# Patient Record
Sex: Male | Born: 1945 | Race: Black or African American | Hispanic: No | Marital: Single | Smoking: Former smoker
Health system: Southern US, Community
[De-identification: ages and names within clinical notes are randomized; demographics above are authoritative.]

## PROBLEM LIST (undated history)

## (undated) DIAGNOSIS — Z86711 Personal history of pulmonary embolism: Secondary | ICD-10-CM

## (undated) DIAGNOSIS — I5032 Chronic diastolic (congestive) heart failure: Secondary | ICD-10-CM

## (undated) DIAGNOSIS — I209 Angina pectoris, unspecified: Secondary | ICD-10-CM

## (undated) DIAGNOSIS — I2510 Atherosclerotic heart disease of native coronary artery without angina pectoris: Secondary | ICD-10-CM

## (undated) DIAGNOSIS — I428 Other cardiomyopathies: Secondary | ICD-10-CM

## (undated) DIAGNOSIS — C3490 Malignant neoplasm of unspecified part of unspecified bronchus or lung: Secondary | ICD-10-CM

## (undated) DIAGNOSIS — Z95810 Presence of automatic (implantable) cardiac defibrillator: Secondary | ICD-10-CM

## (undated) DIAGNOSIS — I509 Heart failure, unspecified: Secondary | ICD-10-CM

## (undated) DIAGNOSIS — N189 Chronic kidney disease, unspecified: Secondary | ICD-10-CM

## (undated) DIAGNOSIS — I10 Essential (primary) hypertension: Secondary | ICD-10-CM

## (undated) DIAGNOSIS — M1990 Unspecified osteoarthritis, unspecified site: Secondary | ICD-10-CM

## (undated) DIAGNOSIS — J189 Pneumonia, unspecified organism: Secondary | ICD-10-CM

## (undated) DIAGNOSIS — J45909 Unspecified asthma, uncomplicated: Secondary | ICD-10-CM

## (undated) DIAGNOSIS — R0600 Dyspnea, unspecified: Secondary | ICD-10-CM

## (undated) DIAGNOSIS — N183 Chronic kidney disease, stage 3 unspecified: Secondary | ICD-10-CM

## (undated) DIAGNOSIS — R519 Headache, unspecified: Secondary | ICD-10-CM

## (undated) DIAGNOSIS — I219 Acute myocardial infarction, unspecified: Secondary | ICD-10-CM

## (undated) DIAGNOSIS — I639 Cerebral infarction, unspecified: Secondary | ICD-10-CM

## (undated) DIAGNOSIS — K219 Gastro-esophageal reflux disease without esophagitis: Secondary | ICD-10-CM

## (undated) DIAGNOSIS — J449 Chronic obstructive pulmonary disease, unspecified: Secondary | ICD-10-CM

## (undated) DIAGNOSIS — I82409 Acute embolism and thrombosis of unspecified deep veins of unspecified lower extremity: Secondary | ICD-10-CM

## (undated) DIAGNOSIS — E119 Type 2 diabetes mellitus without complications: Secondary | ICD-10-CM

## (undated) DIAGNOSIS — C801 Malignant (primary) neoplasm, unspecified: Secondary | ICD-10-CM

## (undated) DIAGNOSIS — E7800 Pure hypercholesterolemia, unspecified: Secondary | ICD-10-CM

## (undated) DIAGNOSIS — M109 Gout, unspecified: Secondary | ICD-10-CM

## (undated) HISTORY — DX: Chronic diastolic (congestive) heart failure: I50.32

## (undated) HISTORY — DX: Chronic kidney disease, stage 3 unspecified: N18.30

## (undated) HISTORY — DX: Pure hypercholesterolemia, unspecified: E78.00

## (undated) HISTORY — DX: Other cardiomyopathies: I42.8

## (undated) HISTORY — DX: Heart failure, unspecified: I50.9

## (undated) HISTORY — DX: Acute embolism and thrombosis of unspecified deep veins of unspecified lower extremity: I82.409

## (undated) HISTORY — DX: Atherosclerotic heart disease of native coronary artery without angina pectoris: I25.10

## (undated) HISTORY — DX: Unspecified osteoarthritis, unspecified site: M19.90

## (undated) HISTORY — PX: CATARACT EXTRACTION, BILATERAL: SHX1313

## (undated) HISTORY — DX: Acute myocardial infarction, unspecified: I21.9

## (undated) HISTORY — PX: CERVICAL SPINE SURGERY: SHX589

## (undated) HISTORY — DX: Gastro-esophageal reflux disease without esophagitis: K21.9

## (undated) HISTORY — DX: Cerebral infarction, unspecified: I63.9

## (undated) HISTORY — PX: NOSE SURGERY: SHX723

## (undated) HISTORY — DX: Personal history of pulmonary embolism: Z86.711

---

## 2009-05-09 DIAGNOSIS — R079 Chest pain, unspecified: Secondary | ICD-10-CM | POA: Diagnosis present

## 2009-05-09 DIAGNOSIS — I10 Essential (primary) hypertension: Secondary | ICD-10-CM | POA: Insufficient documentation

## 2009-05-09 DIAGNOSIS — J45909 Unspecified asthma, uncomplicated: Secondary | ICD-10-CM | POA: Insufficient documentation

## 2012-08-03 DIAGNOSIS — D72829 Elevated white blood cell count, unspecified: Secondary | ICD-10-CM | POA: Insufficient documentation

## 2012-08-03 DIAGNOSIS — N19 Unspecified kidney failure: Secondary | ICD-10-CM | POA: Insufficient documentation

## 2012-08-03 DIAGNOSIS — E875 Hyperkalemia: Secondary | ICD-10-CM | POA: Insufficient documentation

## 2013-07-14 DIAGNOSIS — R634 Abnormal weight loss: Secondary | ICD-10-CM | POA: Insufficient documentation

## 2013-07-14 DIAGNOSIS — R000 Tachycardia, unspecified: Secondary | ICD-10-CM | POA: Insufficient documentation

## 2013-08-23 DIAGNOSIS — E785 Hyperlipidemia, unspecified: Secondary | ICD-10-CM | POA: Diagnosis present

## 2013-08-23 DIAGNOSIS — Z8249 Family history of ischemic heart disease and other diseases of the circulatory system: Secondary | ICD-10-CM | POA: Insufficient documentation

## 2013-08-23 DIAGNOSIS — E119 Type 2 diabetes mellitus without complications: Secondary | ICD-10-CM

## 2013-08-23 DIAGNOSIS — J479 Bronchiectasis, uncomplicated: Secondary | ICD-10-CM | POA: Insufficient documentation

## 2014-02-24 DIAGNOSIS — R590 Localized enlarged lymph nodes: Secondary | ICD-10-CM | POA: Insufficient documentation

## 2014-09-01 DIAGNOSIS — Z86711 Personal history of pulmonary embolism: Secondary | ICD-10-CM

## 2014-09-01 HISTORY — DX: Personal history of pulmonary embolism: Z86.711

## 2014-10-20 DIAGNOSIS — J189 Pneumonia, unspecified organism: Secondary | ICD-10-CM | POA: Insufficient documentation

## 2014-10-20 DIAGNOSIS — R590 Localized enlarged lymph nodes: Secondary | ICD-10-CM | POA: Insufficient documentation

## 2014-12-25 DIAGNOSIS — I447 Left bundle-branch block, unspecified: Secondary | ICD-10-CM | POA: Insufficient documentation

## 2014-12-27 DIAGNOSIS — J209 Acute bronchitis, unspecified: Secondary | ICD-10-CM | POA: Insufficient documentation

## 2014-12-27 DIAGNOSIS — I25119 Atherosclerotic heart disease of native coronary artery with unspecified angina pectoris: Secondary | ICD-10-CM | POA: Insufficient documentation

## 2015-02-22 DIAGNOSIS — Z1211 Encounter for screening for malignant neoplasm of colon: Secondary | ICD-10-CM | POA: Insufficient documentation

## 2015-03-23 DIAGNOSIS — R918 Other nonspecific abnormal finding of lung field: Secondary | ICD-10-CM | POA: Diagnosis present

## 2015-05-10 DIAGNOSIS — J189 Pneumonia, unspecified organism: Secondary | ICD-10-CM | POA: Insufficient documentation

## 2015-06-18 DIAGNOSIS — R0609 Other forms of dyspnea: Secondary | ICD-10-CM | POA: Insufficient documentation

## 2015-06-18 DIAGNOSIS — R0600 Dyspnea, unspecified: Secondary | ICD-10-CM | POA: Insufficient documentation

## 2015-06-18 DIAGNOSIS — R7982 Elevated C-reactive protein (CRP): Secondary | ICD-10-CM | POA: Insufficient documentation

## 2015-06-26 DIAGNOSIS — J151 Pneumonia due to Pseudomonas: Secondary | ICD-10-CM | POA: Insufficient documentation

## 2015-08-15 DIAGNOSIS — J471 Bronchiectasis with (acute) exacerbation: Secondary | ICD-10-CM | POA: Insufficient documentation

## 2015-09-13 DIAGNOSIS — E119 Type 2 diabetes mellitus without complications: Secondary | ICD-10-CM | POA: Insufficient documentation

## 2015-09-18 DIAGNOSIS — J441 Chronic obstructive pulmonary disease with (acute) exacerbation: Secondary | ICD-10-CM | POA: Insufficient documentation

## 2015-09-24 DIAGNOSIS — J470 Bronchiectasis with acute lower respiratory infection: Secondary | ICD-10-CM | POA: Insufficient documentation

## 2017-04-24 DIAGNOSIS — J189 Pneumonia, unspecified organism: Secondary | ICD-10-CM | POA: Insufficient documentation

## 2018-10-11 ENCOUNTER — Encounter (HOSPITAL_COMMUNITY): Payer: Self-pay | Admitting: *Deleted

## 2018-10-11 ENCOUNTER — Inpatient Hospital Stay (HOSPITAL_COMMUNITY)
Admission: EM | Admit: 2018-10-11 | Discharge: 2018-10-14 | DRG: 871 | Disposition: A | Discharge status: Home / Self Care | Payer: Medicare HMO | Attending: Internal Medicine | Admitting: Internal Medicine

## 2018-10-11 ENCOUNTER — Other Ambulatory Visit: Payer: Self-pay

## 2018-10-11 ENCOUNTER — Emergency Department (HOSPITAL_COMMUNITY): Payer: Medicare HMO

## 2018-10-11 DIAGNOSIS — Z79899 Other long term (current) drug therapy: Secondary | ICD-10-CM

## 2018-10-11 DIAGNOSIS — Z7901 Long term (current) use of anticoagulants: Secondary | ICD-10-CM

## 2018-10-11 DIAGNOSIS — J181 Lobar pneumonia, unspecified organism: Secondary | ICD-10-CM | POA: Diagnosis present

## 2018-10-11 DIAGNOSIS — I2510 Atherosclerotic heart disease of native coronary artery without angina pectoris: Secondary | ICD-10-CM | POA: Diagnosis present

## 2018-10-11 DIAGNOSIS — Z86718 Personal history of other venous thrombosis and embolism: Secondary | ICD-10-CM

## 2018-10-11 DIAGNOSIS — Z66 Do not resuscitate: Secondary | ICD-10-CM | POA: Diagnosis present

## 2018-10-11 DIAGNOSIS — J189 Pneumonia, unspecified organism: Secondary | ICD-10-CM | POA: Diagnosis present

## 2018-10-11 DIAGNOSIS — I5042 Chronic combined systolic (congestive) and diastolic (congestive) heart failure: Secondary | ICD-10-CM | POA: Diagnosis present

## 2018-10-11 DIAGNOSIS — A419 Sepsis, unspecified organism: Secondary | ICD-10-CM | POA: Diagnosis not present

## 2018-10-11 DIAGNOSIS — Z87891 Personal history of nicotine dependence: Secondary | ICD-10-CM

## 2018-10-11 DIAGNOSIS — R079 Chest pain, unspecified: Secondary | ICD-10-CM | POA: Diagnosis present

## 2018-10-11 DIAGNOSIS — Z8249 Family history of ischemic heart disease and other diseases of the circulatory system: Secondary | ICD-10-CM

## 2018-10-11 DIAGNOSIS — R6510 Systemic inflammatory response syndrome (SIRS) of non-infectious origin without acute organ dysfunction: Secondary | ICD-10-CM | POA: Diagnosis present

## 2018-10-11 DIAGNOSIS — Z794 Long term (current) use of insulin: Secondary | ICD-10-CM

## 2018-10-11 DIAGNOSIS — J9601 Acute respiratory failure with hypoxia: Secondary | ICD-10-CM | POA: Diagnosis present

## 2018-10-11 DIAGNOSIS — E119 Type 2 diabetes mellitus without complications: Secondary | ICD-10-CM

## 2018-10-11 DIAGNOSIS — Z79891 Long term (current) use of opiate analgesic: Secondary | ICD-10-CM

## 2018-10-11 DIAGNOSIS — Z85118 Personal history of other malignant neoplasm of bronchus and lung: Secondary | ICD-10-CM

## 2018-10-11 DIAGNOSIS — I110 Hypertensive heart disease with heart failure: Secondary | ICD-10-CM | POA: Diagnosis present

## 2018-10-11 DIAGNOSIS — R9431 Abnormal electrocardiogram [ECG] [EKG]: Secondary | ICD-10-CM | POA: Diagnosis present

## 2018-10-11 DIAGNOSIS — I429 Cardiomyopathy, unspecified: Secondary | ICD-10-CM | POA: Diagnosis present

## 2018-10-11 DIAGNOSIS — I447 Left bundle-branch block, unspecified: Secondary | ICD-10-CM | POA: Diagnosis present

## 2018-10-11 DIAGNOSIS — I10 Essential (primary) hypertension: Secondary | ICD-10-CM | POA: Diagnosis present

## 2018-10-11 DIAGNOSIS — M109 Gout, unspecified: Secondary | ICD-10-CM | POA: Diagnosis present

## 2018-10-11 HISTORY — DX: Pneumonia, unspecified organism: J18.9

## 2018-10-11 HISTORY — DX: Unspecified asthma, uncomplicated: J45.909

## 2018-10-11 HISTORY — DX: Atherosclerotic heart disease of native coronary artery without angina pectoris: I25.10

## 2018-10-11 HISTORY — DX: Malignant neoplasm of unspecified part of unspecified bronchus or lung: C34.90

## 2018-10-11 HISTORY — DX: Type 2 diabetes mellitus without complications: E11.9

## 2018-10-11 HISTORY — DX: Essential (primary) hypertension: I10

## 2018-10-11 HISTORY — DX: Malignant (primary) neoplasm, unspecified: C80.1

## 2018-10-11 HISTORY — DX: Gout, unspecified: M10.9

## 2018-10-11 LAB — BASIC METABOLIC PANEL
Anion gap: 10 (ref 5–15)
BUN: 12 mg/dL (ref 8–23)
CO2: 21 mmol/L — AB (ref 22–32)
Calcium: 9.5 mg/dL (ref 8.9–10.3)
Chloride: 107 mmol/L (ref 98–111)
Creatinine, Ser: 1.27 mg/dL — ABNORMAL HIGH (ref 0.61–1.24)
GFR calc non Af Amer: 57 mL/min — ABNORMAL LOW (ref >60)
Glucose, Bld: 108 mg/dL — ABNORMAL HIGH (ref 70–99)
Potassium: 4.1 mmol/L (ref 3.5–5.1)
Sodium: 138 mmol/L (ref 135–145)

## 2018-10-11 LAB — BRAIN NATRIURETIC PEPTIDE: B Natriuretic Peptide: 499 pg/mL — ABNORMAL HIGH (ref 0.0–100.0)

## 2018-10-11 LAB — CBC
HCT: 47.4 % (ref 39.0–52.0)
Hemoglobin: 15 g/dL (ref 13.0–17.0)
MCH: 29.1 pg (ref 26.0–34.0)
MCHC: 31.6 g/dL (ref 30.0–36.0)
MCV: 92 fL (ref 80.0–100.0)
PLATELETS: 213 10*3/uL (ref 150–400)
RBC: 5.15 MIL/uL (ref 4.22–5.81)
RDW: 14.4 % (ref 11.5–15.5)
WBC: 12.3 10*3/uL — ABNORMAL HIGH (ref 4.0–10.5)
nRBC: 0 % (ref 0.0–0.2)

## 2018-10-11 LAB — INFLUENZA PANEL BY PCR (TYPE A & B)
Influenza A By PCR: NEGATIVE
Influenza B By PCR: NEGATIVE

## 2018-10-11 LAB — TROPONIN I: Troponin I: 0.05 ng/mL (ref <0.03)

## 2018-10-11 LAB — LACTIC ACID, PLASMA: Lactic Acid, Venous: 1.4 mmol/L (ref 0.5–1.9)

## 2018-10-11 IMAGING — CR DG CHEST 1V PORT
2 series · 2 of 2 positions shown · non-contrast
Comparison: None.

CLINICAL DATA: Left chest pain, nausea, vomiting and fever.

EXAM:
PORTABLE CHEST 1 VIEW

[ap (1 of 2)]
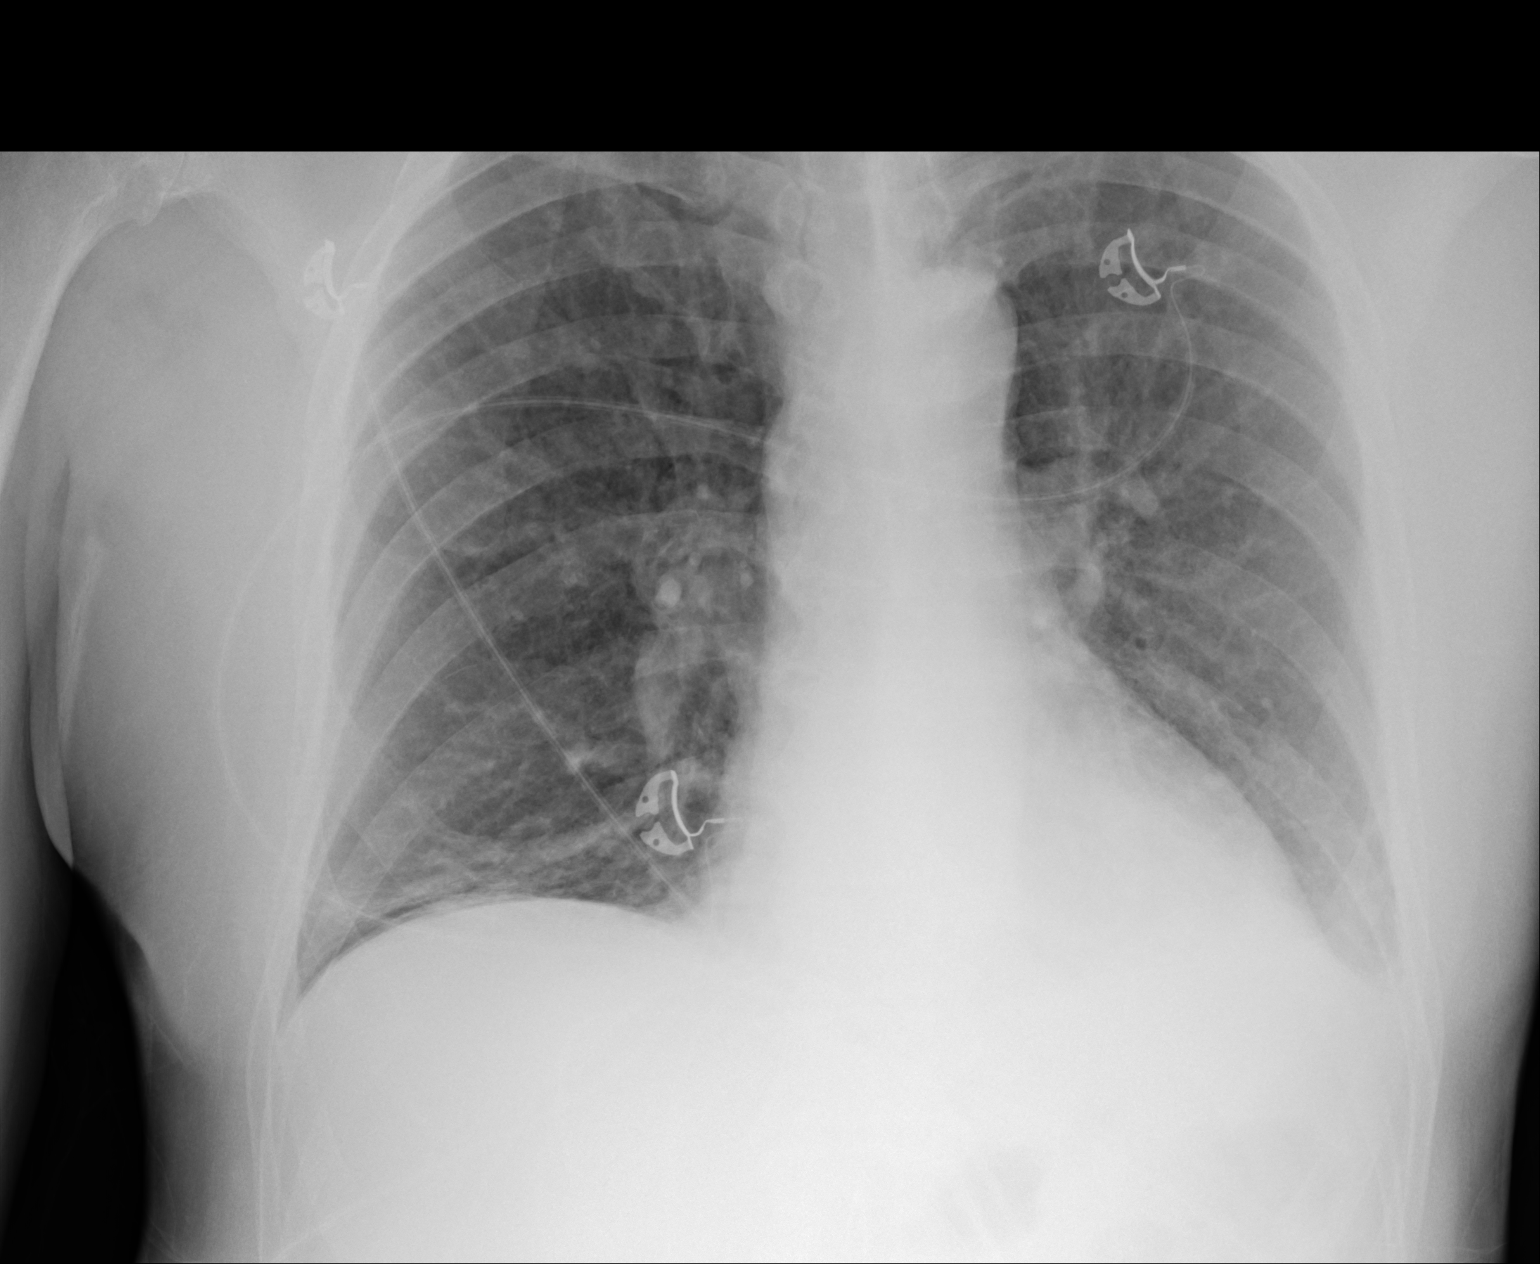

[ap (2 of 2)]
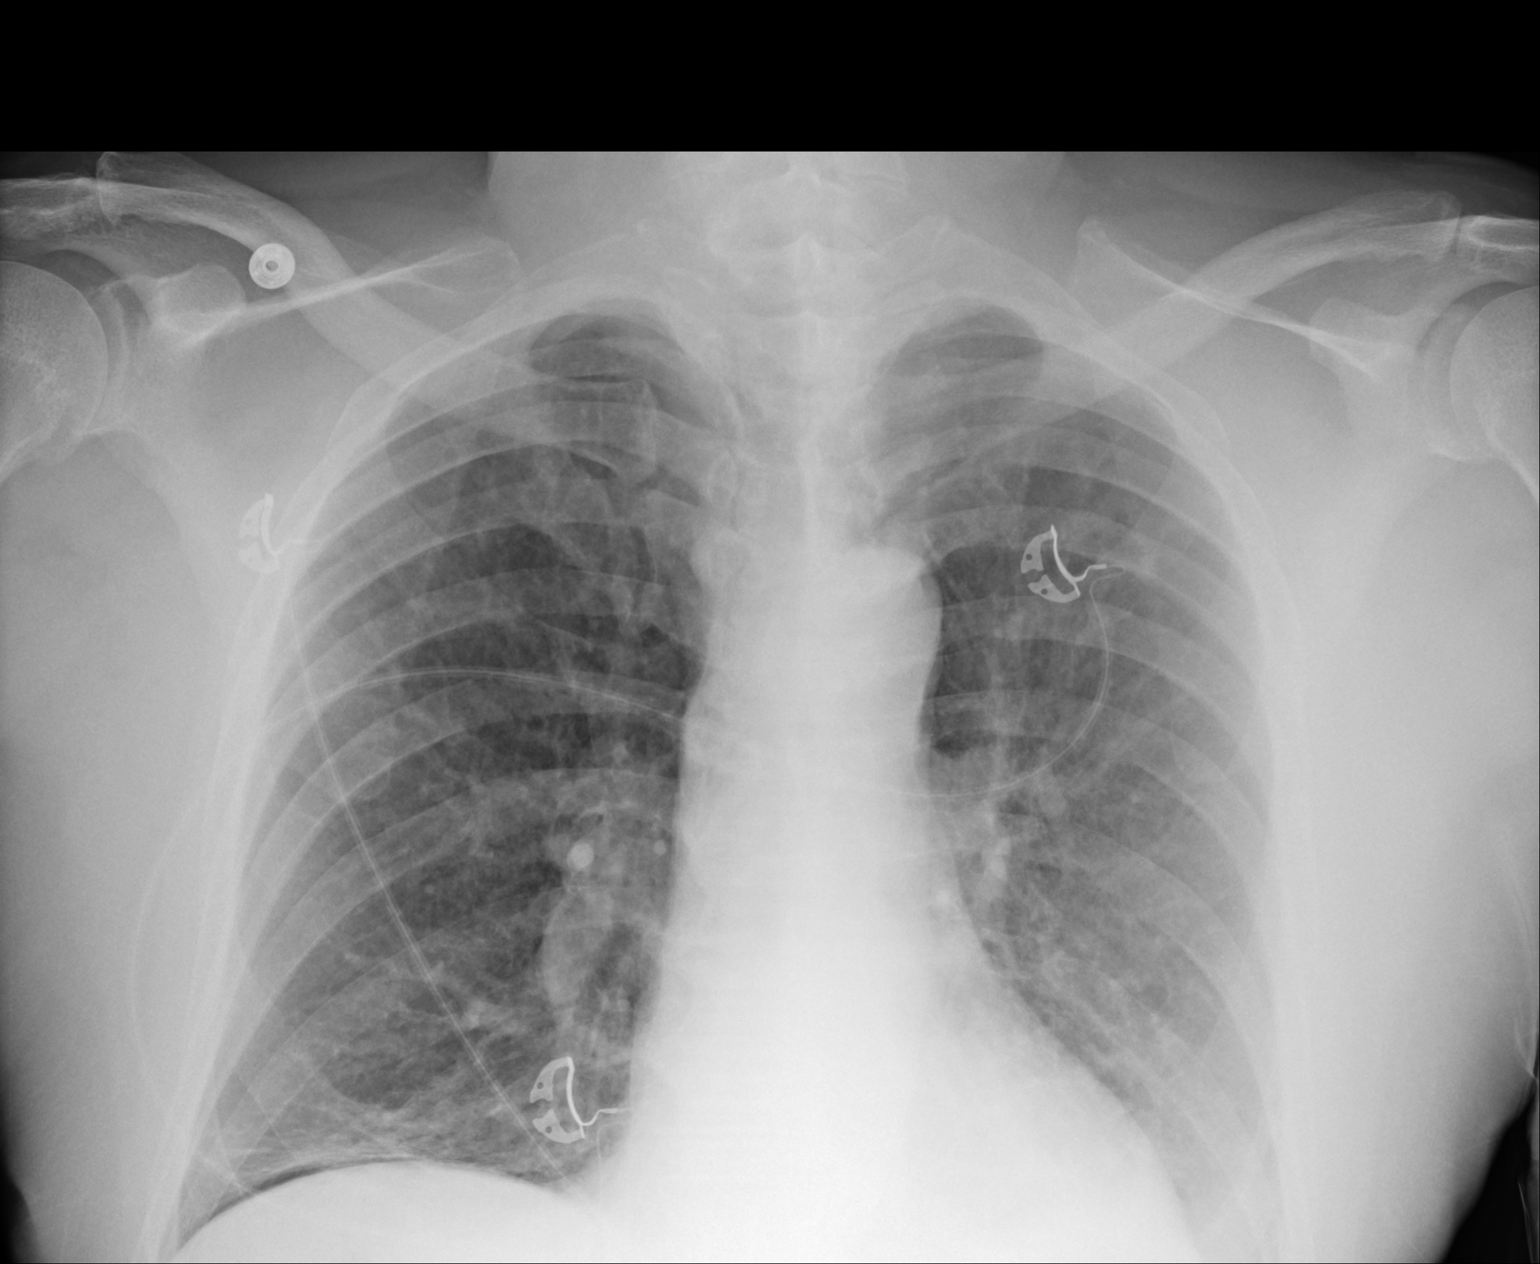

[2 of 2 positions shown; findings below may reference images not displayed]

FINDINGS: There is left basilar airspace disease and a small left effusion.
Lucency is seen subjacent to the right lung base. Heart size is
normal. Aortic atherosclerosis is noted. No acute bony abnormality.
IMPRESSION: Left effusion and basilar airspace disease worrisome for pneumonia.

Thin lucency subjacent to the right lung base. Note is made the
patient is scheduled for a CT of the chest this same day. Recommend
attention to this finding on that examination.

## 2018-10-11 IMAGING — CT CT ANGIO CHEST
2 of 6 series · 18 of 46 positions shown · IV contrast (iopamidol)
Comparison: Chest x-ray [DATE]

CLINICAL DATA: Left chest pain.  Cough.

EXAM:
CT ANGIOGRAPHY CHEST WITH CONTRAST
TECHNIQUE: Multidetector CT imaging of the chest was performed using the
standard protocol during bolus administration of intravenous
contrast. Multiplanar CT image reconstructions and MIPs were
obtained to evaluate the vascular anatomy.
CONTRAST:  100mL [4A] IOPAMIDOL ([4A]) INJECTION 76%

[Series 5: thins · axial · 0.82mm/px · z∈[-191,+46]mm · 15 of 261 slices shown]
[im 12/261  lung]
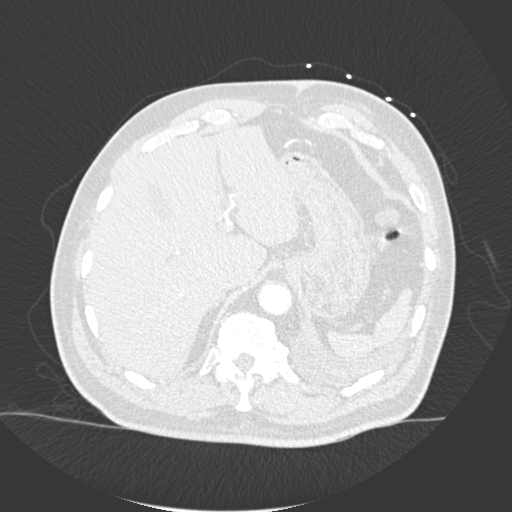
[im 34/261  soft-tissue]
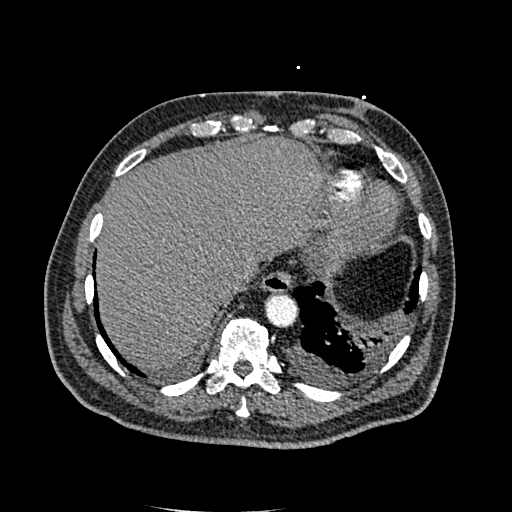
[im 46/261  lung]
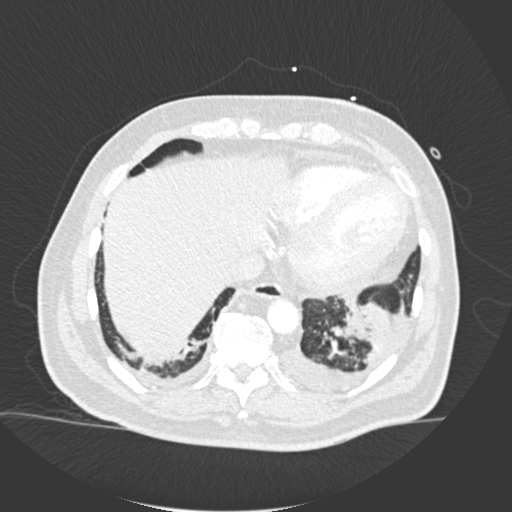
[im 68/261  soft-tissue]
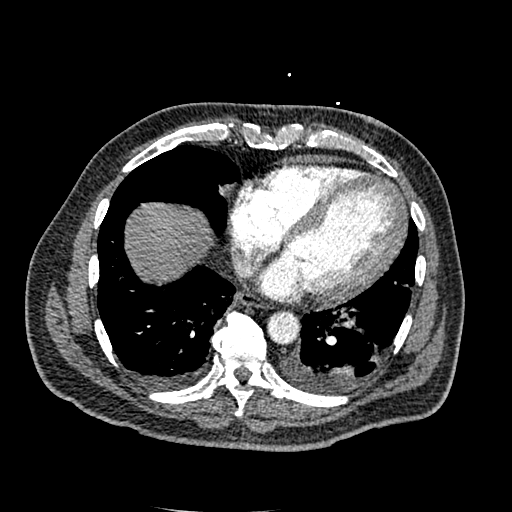
[im 80/261  lung]
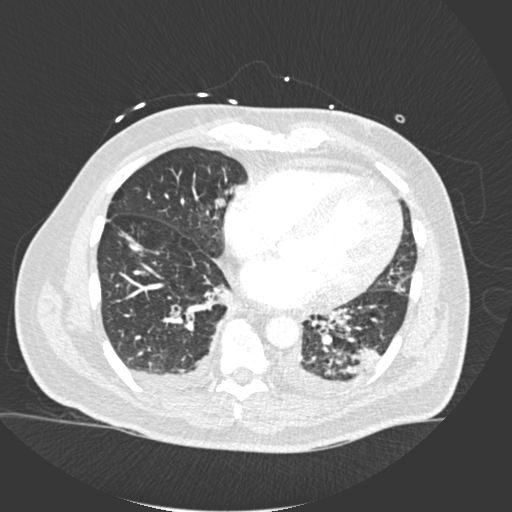
[im 102/261  soft-tissue]
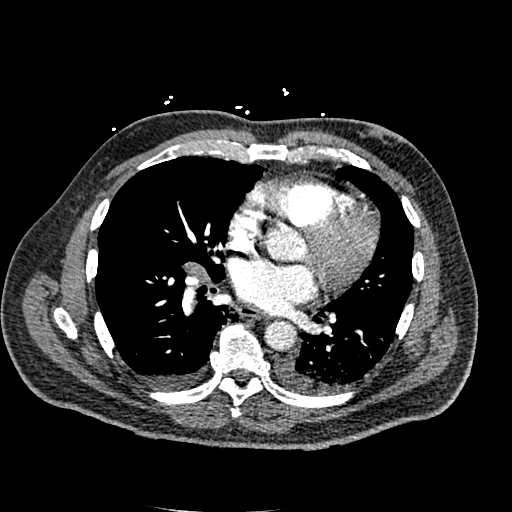
[im 114/261  lung]
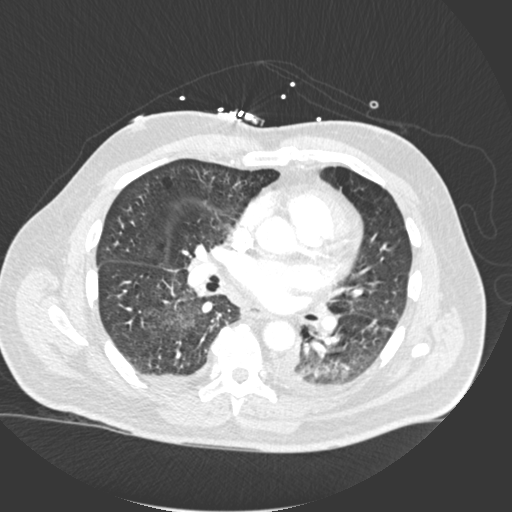
[im 136/261  soft-tissue]
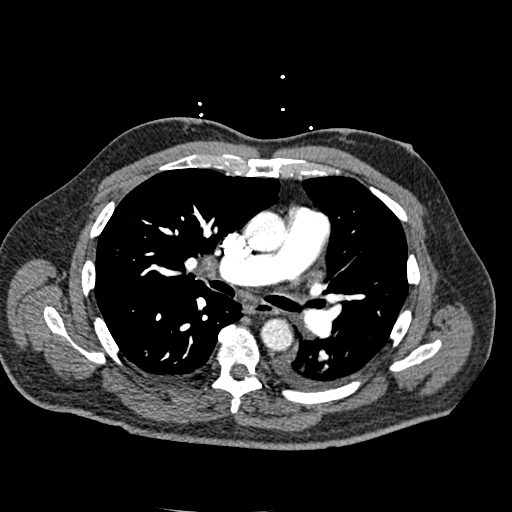
[im 147/261  lung]
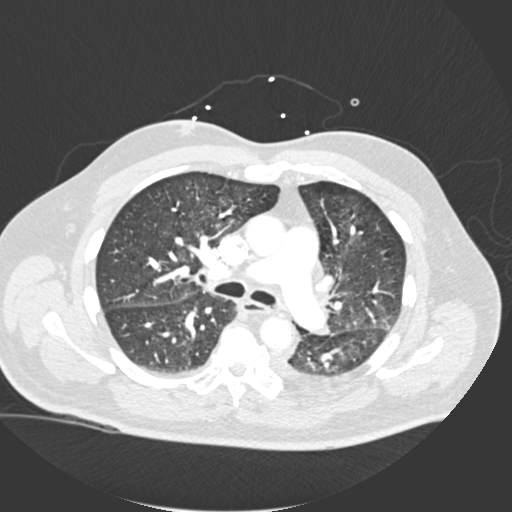
[im 159/261  soft-tissue]
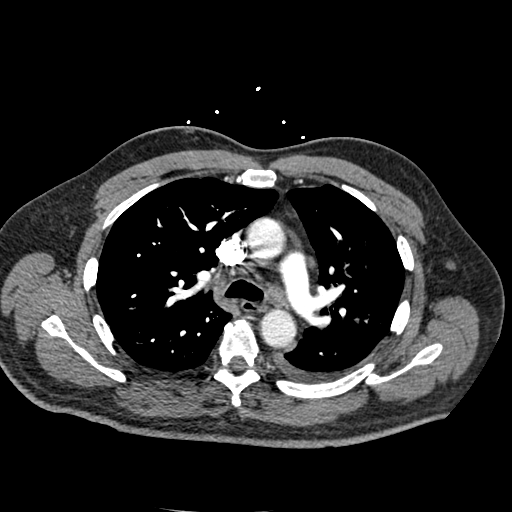
[im 181/261  lung]
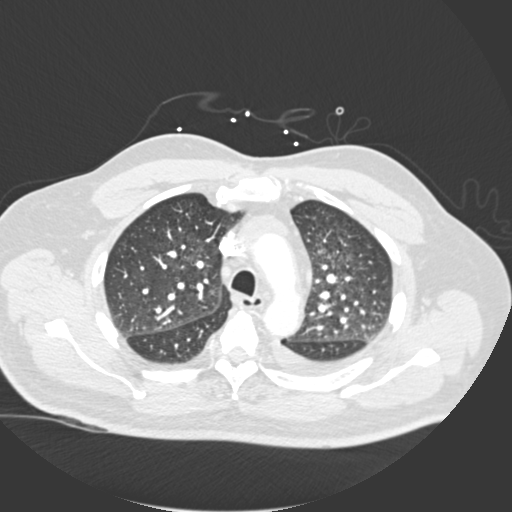
[im 193/261  soft-tissue]
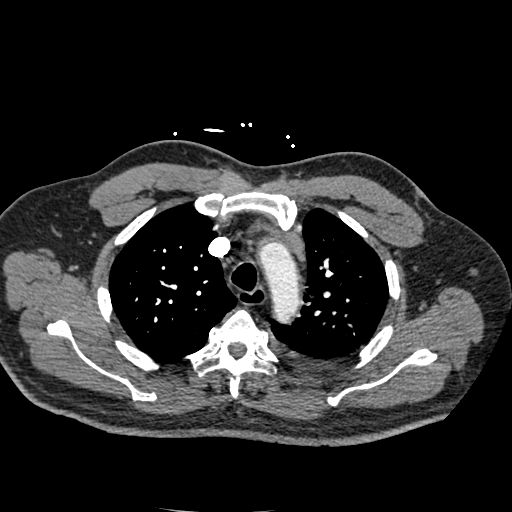
[im 215/261  lung]
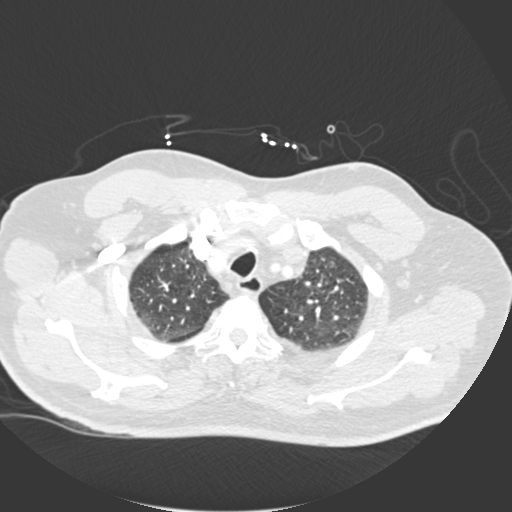
[im 227/261  soft-tissue]
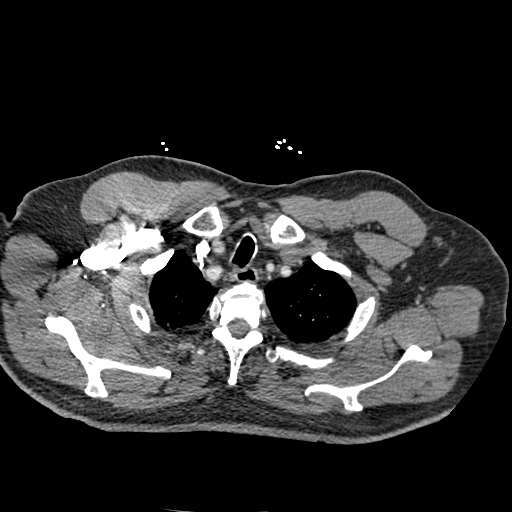
[im 249/261  lung]
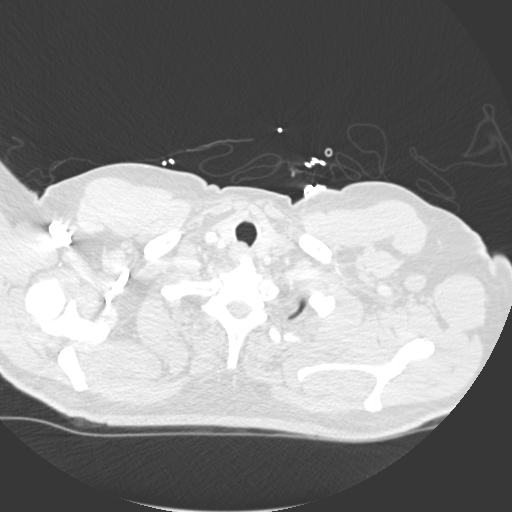

[Series 8: coronal mpr · coronal · 0.52mm/px · 3 of 151 slices shown]
[im 38/151  soft-tissue]
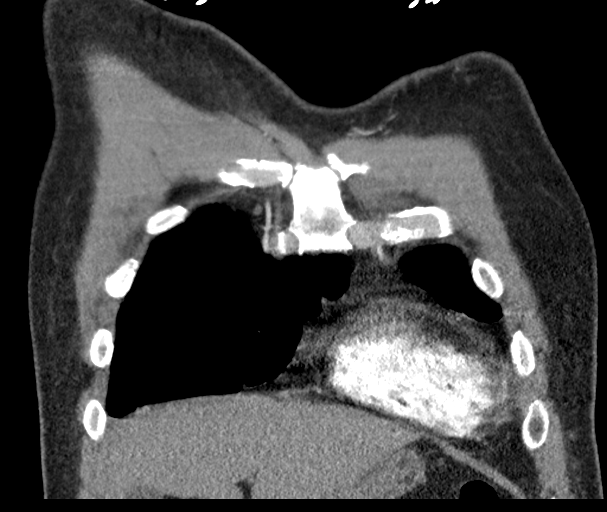
[im 76/151  soft-tissue]
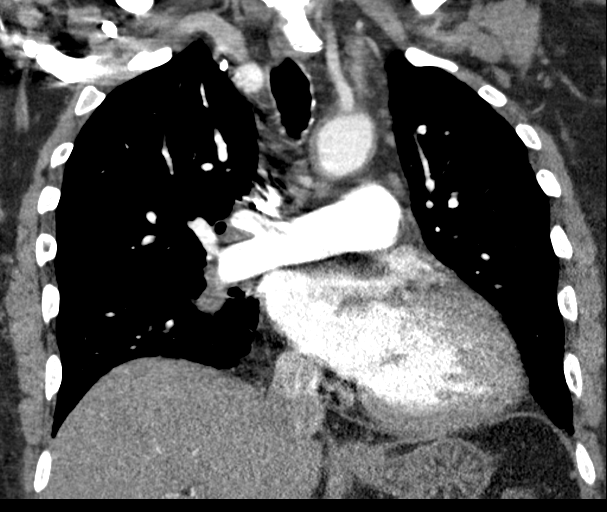
[im 113/151  soft-tissue]
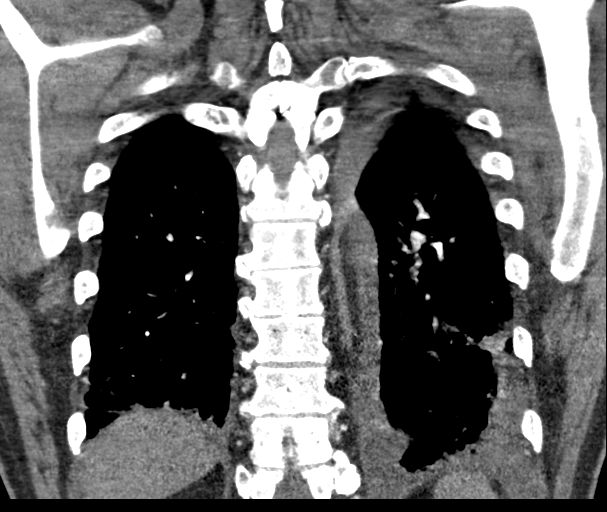

[18 of 46 positions shown; findings below may reference images not displayed]

FINDINGS: Cardiovascular: No filling defects in the pulmonary arteries to
suggest pulmonary emboli. Heart is mildly enlarged. Scattered
coronary artery and aortic calcifications.

Mediastinum/Nodes: Mildly enlarged AP window lymph node measures 12
mm in short axis diameter. Scattered other borderline sized
mediastinal and bilateral hilar lymph nodes. No axillary adenopathy.

Lungs/Pleura: Small bilateral pleural effusions. Peribronchial
thickening in the lower lobes. Right basilar atelectasis. More
confluent opacity noted in the left lower lobe concerning for
pneumonia.

Upper Abdomen: Imaging into the upper abdomen shows no acute
findings.

Musculoskeletal: Chest wall soft tissues are unremarkable. No acute
bony abnormality.

Review of the MIP images confirms the above findings.
IMPRESSION: Small bilateral pleural effusions, bibasilar atelectasis. More
confluent opacity in the left lower lobe concerning for pneumonia.

No evidence of pulmonary embolus.

Mildly enlarged and borderline sized mediastinal and bilateral hilar
lymph nodes, likely reactive.

Mild cardiomegaly.

Aortic Atherosclerosis ([4A]-[4A]).

## 2018-10-11 MED ORDER — SODIUM CHLORIDE 0.9 % IV BOLUS
1000.0000 mL | Freq: Once | INTRAVENOUS | Status: AC
  Administered 2018-10-11: 1000 mL via INTRAVENOUS

## 2018-10-11 MED ORDER — SODIUM CHLORIDE 0.9% FLUSH: 3.0000 mL | Freq: Once | INTRAVENOUS | Status: DC

## 2018-10-11 MED ORDER — SODIUM CHLORIDE 0.9 % IV SOLN
2.0000 g | Freq: Once | INTRAVENOUS | Status: AC
  Administered 2018-10-11: 2 g via INTRAVENOUS
  Filled 2018-10-11: qty 20

## 2018-10-11 MED ORDER — SODIUM CHLORIDE 0.9 % IV SOLN
500.0000 mg | INTRAVENOUS | Status: DC
  Administered 2018-10-11: 500 mg via INTRAVENOUS
  Filled 2018-10-11: qty 500

## 2018-10-11 MED ORDER — ASPIRIN 81 MG PO CHEW
324.0000 mg | CHEWABLE_TABLET | Freq: Once | ORAL | Status: AC
  Administered 2018-10-11: 324 mg via ORAL
  Filled 2018-10-11: qty 4

## 2018-10-11 MED ORDER — IOPAMIDOL (ISOVUE-370) INJECTION 76%
100.0000 mL | Freq: Once | INTRAVENOUS | Status: AC | PRN
  Administered 2018-10-11: 100 mL via INTRAVENOUS

## 2018-10-11 MED ORDER — HYDROMORPHONE HCL 1 MG/ML IJ SOLN
1.0000 mg | Freq: Once | INTRAMUSCULAR | Status: AC
  Administered 2018-10-11: 1 mg via INTRAVENOUS
  Filled 2018-10-11: qty 1

## 2018-10-11 NOTE — ED Notes (Signed)
Date and time results received: 10/11/18 2249 (use smartphrase ".now" to insert current time)  Test: troponin Critical Value: 0.05  Name of Provider Notified: Dr Sedonia Small  Orders Received? Or Actions Taken?: Actions Taken: no orders received

## 2018-10-11 NOTE — ED Triage Notes (Signed)
Pt c/o left side chest pain that started yesterday with n/v that appeared to have blood mixed in with it along cough that is productive with blood tinged mucous, fever

## 2018-10-11 NOTE — ED Provider Notes (Signed)
[facility] Emergency Department Provider Note MRN:  [id number]  Arrival date & time: 10/11/18     Chief Complaint   Chest Pain   History of Present Illness   Roger Lowe. is a 73 y.o. year-old male with a history of diabetes, CAD, VTE presenting to the ED with chief complaint of chest pain.  Chest pain began 2 days ago, sudden onset, described as a squeezing pain on the left side of the chest, was associated with lightheadedness, near fainting episode.  Has been coming and going since that time, became more constant today.  8 out of 10 in severity.  Associated with nausea, vomiting x2, nonbloody nonbilious.  Also endorsing subjective fever, cough at times productive of bloody sputum.  Denies abdominal pain.  Patient explains that he recently moved here from [place] 2 to 3 months ago, his cardiologist told him that he was a candidate for a stent in the heart.  Review of Systems  A complete 10 system review of systems was obtained and all systems are negative except as noted in the HPI and PMH.   Patient's Health History    Past Medical History:  Diagnosis Date  . Asthma   . Cancer ([place])   . Coronary artery disease   . Diabetes mellitus without complication ([place])   . Gout   . Hypertension   . Lung cancer ([place])   . Pneumonia     Past Surgical History:  Procedure Laterality Date  . CERVICAL SPINE SURGERY      History reviewed. No pertinent family history.  Social History   Socioeconomic History  . Marital status: Single    Spouse name: Not on file  . Number of children: Not on file  . Years of education: Not on file  . Highest education level: Not on file  Occupational History  . Not on file  Social Needs  . Financial resource strain: Not on file  . Food insecurity:    Worry: Not on file    Inability: Not on file  . Transportation needs:    Medical: Not on file    Non-medical: Not on file  Tobacco Use  . Smoking status: Former Research scientist (life sciences)  . Smokeless  tobacco: Never Used  Substance and Sexual Activity  . Alcohol use: Not on file  . Drug use: Not on file  . Sexual activity: Not on file  Lifestyle  . Physical activity:    Days per week: Not on file    Minutes per session: Not on file  . Stress: Not on file  Relationships  . Social connections:    Talks on phone: Not on file    Gets together: Not on file    Attends religious service: Not on file    Active member of club or organization: Not on file    Attends meetings of clubs or organizations: Not on file    Relationship status: Not on file  . Intimate partner violence:    Fear of current or ex partner: Not on file    Emotionally abused: Not on file    Physically abused: Not on file    Forced sexual activity: Not on file  Other Topics Concern  . Not on file  Social History Narrative  . Not on file     Physical Exam  Vital Signs and Nursing Notes reviewed Vitals:   10/11/18 2120 10/11/18 2300  BP: (!) 170/107 (!) 162/104  Pulse: (!) 143 (!) 123  Resp: [name])  26 (!) 24  Temp: 98.5 F (36.9 C)   SpO2: 97% 94%    CONSTITUTIONAL: Well-appearing, NAD NEURO:  Alert and oriented x 3, no focal deficits EYES:  eyes equal and reactive ENT/NECK:  no LAD, no JVD CARDIO: Tachycardic rate, well-perfused, normal S1 and S2 PULM:  CTAB no wheezing or rhonchi GI/GU:  normal bowel sounds, non-distended, non-tender MSK/SPINE:  No gross deformities, no edema SKIN:  no rash, atraumatic PSYCH:  Appropriate speech and behavior  Diagnostic and Interventional Summary    EKG Interpretation  Date/Time:  Monday October 11 2018 23:33:54 EST Ventricular Rate:  124 PR Interval:    QRS Duration: 142 QT Interval:  387 QTC Calculation: 556 R Axis:   106 Text Interpretation:  Sinus or ectopic atrial tachycardia Multiple ventricular premature complexes Left bundle branch block Confirmed by Gerlene Fee [phone]) on 10/11/2018 11:45:51 PM      Labs Reviewed  BASIC METABOLIC PANEL - Abnormal;  Notable for the following components:      Result Value   CO2 21 (*)    Glucose, Bld 108 (*)    Creatinine, Ser 1.27 (*)    GFR calc non Af Amer 57 (*)    All other components within normal limits  CBC - Abnormal; Notable for the following components:   WBC 12.3 (*)    All other components within normal limits  TROPONIN I - Abnormal; Notable for the following components:   Troponin I 0.05 (*)    All other components within normal limits  BRAIN NATRIURETIC PEPTIDE - Abnormal; Notable for the following components:   B Natriuretic Peptide 499.0 (*)    All other components within normal limits  CULTURE, BLOOD (ROUTINE X 2)  CULTURE, BLOOD (ROUTINE X 2)  LACTIC ACID, PLASMA  INFLUENZA PANEL BY PCR (TYPE A & B)  LACTIC ACID, PLASMA    CT ANGIO CHEST PE W OR WO CONTRAST  Final Result    DG Chest Port 1 View  Final Result      Medications  sodium chloride flush (NS) 0.9 % injection 3 mL (has no administration in time range)  cefTRIAXone (ROCEPHIN) 2 g in sodium chloride 0.9 % 100 mL IVPB (2 g Intravenous New Bag/Given 10/11/18 2320)  azithromycin (ZITHROMAX) 500 mg in sodium chloride 0.9 % 250 mL IVPB (has no administration in time range)  sodium chloride 0.9 % bolus 1,000 mL (1,000 mLs Intravenous New Bag/Given 10/11/18 2319)  sodium chloride 0.9 % bolus 1,000 mL (0 mLs Intravenous Stopped 10/11/18 2319)  aspirin chewable tablet 324 mg (324 mg Oral Given 10/11/18 2205)  HYDROmorphone (DILAUDID) injection 1 mg (1 mg Intravenous Given 10/11/18 2205)  iopamidol (ISOVUE-370) 76 % injection 100 mL (100 mLs Intravenous Contrast Given 10/11/18 2311)     Procedures Critical Care Critical Care Documentation Critical care time provided by me (excluding procedures): 35 minutes  Condition necessitating critical care:  Concern for sepsis due to pneumonia  Components of critical care management: reviewing of prior records, laboratory and imaging interpretation, frequent re-examination and  reassessment of vital signs, administration of IV fluids, IV antibiotics, discussion with consulting services    ED Course and Medical Decision Making  I have reviewed the triage vital signs and the nursing notes.  Pertinent labs & imaging results that were available during my care of the patient were reviewed by me and considered in my medical decision making (see below for details).  Concern for ACS versus pulmonary embolism in this 72 year old male with history  of each, tachycardic to 140s, normotensive, well-appearing, EKG with left bundle branch block, which patient reports he has a history of.  Labs, CTA pending.  CTA with no evidence of PE, again demonstrating likely pneumonia.  Patient's heart rate improved from 140s to 120s with IV fluids.  IV antibiotics started, empiric CAP coverage, admitted to hospital service for further care.  Barth Kirks. Sedonia Small, [place] [email or web address]  Final Clinical Impressions(s) / ED Diagnoses     ICD-10-CM   1. Community acquired pneumonia, unspecified laterality J18.9   2. Chest pain R07.9 DG Chest [facility] 1 View    DG Chest [facility]    ED Discharge Orders    None         Maudie Flakes, MD 10/11/18 [phone]

## 2018-10-12 ENCOUNTER — Inpatient Hospital Stay (HOSPITAL_COMMUNITY): Payer: Medicare HMO

## 2018-10-12 DIAGNOSIS — R0789 Other chest pain: Secondary | ICD-10-CM | POA: Diagnosis not present

## 2018-10-12 DIAGNOSIS — Z79899 Other long term (current) drug therapy: Secondary | ICD-10-CM | POA: Diagnosis not present

## 2018-10-12 DIAGNOSIS — Z86718 Personal history of other venous thrombosis and embolism: Secondary | ICD-10-CM | POA: Diagnosis not present

## 2018-10-12 DIAGNOSIS — I110 Hypertensive heart disease with heart failure: Secondary | ICD-10-CM | POA: Diagnosis present

## 2018-10-12 DIAGNOSIS — I5042 Chronic combined systolic (congestive) and diastolic (congestive) heart failure: Secondary | ICD-10-CM | POA: Diagnosis not present

## 2018-10-12 DIAGNOSIS — E785 Hyperlipidemia, unspecified: Secondary | ICD-10-CM | POA: Diagnosis not present

## 2018-10-12 DIAGNOSIS — J9601 Acute respiratory failure with hypoxia: Secondary | ICD-10-CM | POA: Diagnosis present

## 2018-10-12 DIAGNOSIS — Z794 Long term (current) use of insulin: Secondary | ICD-10-CM

## 2018-10-12 DIAGNOSIS — R6510 Systemic inflammatory response syndrome (SIRS) of non-infectious origin without acute organ dysfunction: Secondary | ICD-10-CM | POA: Diagnosis present

## 2018-10-12 DIAGNOSIS — J189 Pneumonia, unspecified organism: Secondary | ICD-10-CM | POA: Diagnosis not present

## 2018-10-12 DIAGNOSIS — Z85118 Personal history of other malignant neoplasm of bronchus and lung: Secondary | ICD-10-CM | POA: Diagnosis not present

## 2018-10-12 DIAGNOSIS — M109 Gout, unspecified: Secondary | ICD-10-CM | POA: Diagnosis present

## 2018-10-12 DIAGNOSIS — I10 Essential (primary) hypertension: Secondary | ICD-10-CM | POA: Diagnosis not present

## 2018-10-12 DIAGNOSIS — R079 Chest pain, unspecified: Secondary | ICD-10-CM | POA: Diagnosis not present

## 2018-10-12 DIAGNOSIS — R9431 Abnormal electrocardiogram [ECG] [EKG]: Secondary | ICD-10-CM | POA: Diagnosis not present

## 2018-10-12 DIAGNOSIS — E119 Type 2 diabetes mellitus without complications: Secondary | ICD-10-CM

## 2018-10-12 DIAGNOSIS — Z79891 Long term (current) use of opiate analgesic: Secondary | ICD-10-CM | POA: Diagnosis not present

## 2018-10-12 DIAGNOSIS — J181 Lobar pneumonia, unspecified organism: Secondary | ICD-10-CM | POA: Diagnosis not present

## 2018-10-12 DIAGNOSIS — A419 Sepsis, unspecified organism: Secondary | ICD-10-CM | POA: Diagnosis present

## 2018-10-12 DIAGNOSIS — Z66 Do not resuscitate: Secondary | ICD-10-CM | POA: Diagnosis present

## 2018-10-12 DIAGNOSIS — I447 Left bundle-branch block, unspecified: Secondary | ICD-10-CM | POA: Diagnosis present

## 2018-10-12 DIAGNOSIS — Z7901 Long term (current) use of anticoagulants: Secondary | ICD-10-CM | POA: Diagnosis not present

## 2018-10-12 DIAGNOSIS — Z87891 Personal history of nicotine dependence: Secondary | ICD-10-CM | POA: Diagnosis not present

## 2018-10-12 DIAGNOSIS — Z8249 Family history of ischemic heart disease and other diseases of the circulatory system: Secondary | ICD-10-CM | POA: Diagnosis not present

## 2018-10-12 DIAGNOSIS — I2510 Atherosclerotic heart disease of native coronary artery without angina pectoris: Secondary | ICD-10-CM | POA: Diagnosis not present

## 2018-10-12 DIAGNOSIS — I429 Cardiomyopathy, unspecified: Secondary | ICD-10-CM | POA: Diagnosis present

## 2018-10-12 LAB — GLUCOSE, CAPILLARY
Glucose-Capillary: 161 mg/dL — ABNORMAL HIGH (ref 70–99)
Glucose-Capillary: 117 mg/dL — ABNORMAL HIGH (ref 70–99)
Glucose-Capillary: 85 mg/dL (ref 70–99)

## 2018-10-12 LAB — COMPREHENSIVE METABOLIC PANEL
ALK PHOS: 74 U/L (ref 38–126)
ALT: 13 U/L (ref 0–44)
AST: 16 U/L (ref 15–41)
Albumin: 3.4 g/dL — ABNORMAL LOW (ref 3.5–5.0)
Anion gap: 9 (ref 5–15)
BUN: 14 mg/dL (ref 8–23)
CALCIUM: 8.7 mg/dL — AB (ref 8.9–10.3)
CO2: 22 mmol/L (ref 22–32)
Chloride: 108 mmol/L (ref 98–111)
Creatinine, Ser: 1.19 mg/dL (ref 0.61–1.24)
GFR calc Af Amer: >60 mL/min (ref >60)
GFR calc non Af Amer: >60 mL/min (ref >60)
Glucose, Bld: 105 mg/dL — ABNORMAL HIGH (ref 70–99)
Potassium: 4.2 mmol/L (ref 3.5–5.1)
Sodium: 139 mmol/L (ref 135–145)
Total Bilirubin: 0.8 mg/dL (ref 0.3–1.2)
Total Protein: 7.1 g/dL (ref 6.5–8.1)

## 2018-10-12 LAB — TROPONIN I
Troponin I: 0.06 ng/mL (ref <0.03)
Troponin I: 0.06 ng/mL (ref <0.03)
Troponin I: 0.06 ng/mL (ref <0.03)
Troponin I: 0.06 ng/mL (ref <0.03)

## 2018-10-12 LAB — ECHOCARDIOGRAM COMPLETE
Height: 73 in
WEIGHTICAEL: 3675.51 [oz_av]

## 2018-10-12 LAB — MRSA PCR SCREENING: MRSA by PCR: NEGATIVE

## 2018-10-12 LAB — MAGNESIUM: Magnesium: 2 mg/dL (ref 1.7–2.4)

## 2018-10-12 LAB — CBC
HCT: 47.1 % (ref 39.0–52.0)
Hemoglobin: 14.6 g/dL (ref 13.0–17.0)
MCH: 29.3 pg (ref 26.0–34.0)
MCHC: 31 g/dL (ref 30.0–36.0)
MCV: 94.6 fL (ref 80.0–100.0)
Platelets: 175 10*3/uL (ref 150–400)
RBC: 4.98 MIL/uL (ref 4.22–5.81)
RDW: 14.7 % (ref 11.5–15.5)
WBC: 9.2 10*3/uL (ref 4.0–10.5)
nRBC: 0 % (ref 0.0–0.2)

## 2018-10-12 LAB — LACTIC ACID, PLASMA: Lactic Acid, Venous: 1.6 mmol/L (ref 0.5–1.9)

## 2018-10-12 LAB — CBG MONITORING, ED
Glucose-Capillary: 86 mg/dL (ref 70–99)
Glucose-Capillary: 77 mg/dL (ref 70–99)

## 2018-10-12 LAB — HEMOGLOBIN A1C
Hgb A1c MFr Bld: 7.4 % — ABNORMAL HIGH (ref 4.8–5.6)
Mean Plasma Glucose: 165.68 mg/dL

## 2018-10-12 LAB — STREP PNEUMONIAE URINARY ANTIGEN: Strep Pneumo Urinary Antigen: NEGATIVE

## 2018-10-12 MED ORDER — AMITRIPTYLINE HCL 25 MG PO TABS
50.0000 mg | ORAL_TABLET | Freq: Two times a day (BID) | ORAL | Status: DC
  Administered 2018-10-12: 50 mg via ORAL
  Filled 2018-10-12 (×4): qty 1
  Filled 2018-10-12: qty 2

## 2018-10-12 MED ORDER — ORAL CARE MOUTH RINSE
15.0000 mL | Freq: Two times a day (BID) | OROMUCOSAL | Status: DC
  Administered 2018-10-12 – 2018-10-14 (×3): 15 mL via OROMUCOSAL

## 2018-10-12 MED ORDER — AMLODIPINE BESYLATE 5 MG PO TABS
10.0000 mg | ORAL_TABLET | Freq: Every day | ORAL | Status: DC
  Administered 2018-10-12 – 2018-10-13 (×2): 10 mg via ORAL
  Filled 2018-10-12 (×2): qty 2

## 2018-10-12 MED ORDER — ATORVASTATIN CALCIUM 20 MG PO TABS
20.0000 mg | ORAL_TABLET | Freq: Every day | ORAL | Status: DC
  Administered 2018-10-12 – 2018-10-13 (×3): 20 mg via ORAL
  Filled 2018-10-12: qty 2
  Filled 2018-10-12 (×2): qty 1

## 2018-10-12 MED ORDER — FUROSEMIDE 10 MG/ML IJ SOLN: 40.0000 mg | Freq: Once | INTRAMUSCULAR | Status: DC

## 2018-10-12 MED ORDER — ONDANSETRON HCL 4 MG/2ML IJ SOLN: 4.0000 mg | Freq: Four times a day (QID) | INTRAMUSCULAR | Status: DC | PRN

## 2018-10-12 MED ORDER — HYDRALAZINE HCL 25 MG PO TABS
50.0000 mg | ORAL_TABLET | Freq: Two times a day (BID) | ORAL | Status: DC
  Administered 2018-10-12 – 2018-10-14 (×6): 50 mg via ORAL
  Filled 2018-10-12 (×6): qty 2

## 2018-10-12 MED ORDER — LEVALBUTEROL HCL 0.63 MG/3ML IN NEBU
0.6300 mg | INHALATION_SOLUTION | Freq: Four times a day (QID) | RESPIRATORY_TRACT | Status: DC
  Administered 2018-10-12 – 2018-10-14 (×8): 0.63 mg via RESPIRATORY_TRACT
  Filled 2018-10-12 (×9): qty 3

## 2018-10-12 MED ORDER — IPRATROPIUM BROMIDE 0.02 % IN SOLN
0.5000 mg | Freq: Four times a day (QID) | RESPIRATORY_TRACT | Status: DC
  Administered 2018-10-12 – 2018-10-14 (×8): 0.5 mg via RESPIRATORY_TRACT
  Filled 2018-10-12 (×9): qty 2.5

## 2018-10-12 MED ORDER — SODIUM CHLORIDE 0.9 % IV SOLN
1.0000 g | INTRAVENOUS | Status: DC
  Administered 2018-10-12 – 2018-10-14 (×3): 1 g via INTRAVENOUS
  Filled 2018-10-12: qty 1
  Filled 2018-10-12: qty 10
  Filled 2018-10-12: qty 1
  Filled 2018-10-12: qty 10

## 2018-10-12 MED ORDER — CARVEDILOL 12.5 MG PO TABS
25.0000 mg | ORAL_TABLET | Freq: Two times a day (BID) | ORAL | Status: DC
  Administered 2018-10-12 – 2018-10-14 (×6): 25 mg via ORAL
  Filled 2018-10-12 (×6): qty 2

## 2018-10-12 MED ORDER — FUROSEMIDE 10 MG/ML IJ SOLN
40.0000 mg | Freq: Once | INTRAMUSCULAR | Status: AC
  Administered 2018-10-12: 40 mg via INTRAVENOUS
  Filled 2018-10-12: qty 4

## 2018-10-12 MED ORDER — FAMOTIDINE 20 MG PO TABS
40.0000 mg | ORAL_TABLET | Freq: Every day | ORAL | Status: DC
  Administered 2018-10-12 – 2018-10-13 (×3): 40 mg via ORAL
  Filled 2018-10-12 (×3): qty 2

## 2018-10-12 MED ORDER — ALLOPURINOL 300 MG PO TABS
300.0000 mg | ORAL_TABLET | Freq: Every day | ORAL | Status: DC
  Administered 2018-10-12 – 2018-10-14 (×3): 300 mg via ORAL
  Filled 2018-10-12 (×4): qty 1

## 2018-10-12 MED ORDER — DM-GUAIFENESIN ER 30-600 MG PO TB12
1.0000 | ORAL_TABLET | Freq: Two times a day (BID) | ORAL | Status: DC
  Administered 2018-10-12 – 2018-10-14 (×5): 1 via ORAL
  Filled 2018-10-12 (×5): qty 1

## 2018-10-12 MED ORDER — RIVAROXABAN 20 MG PO TABS
20.0000 mg | ORAL_TABLET | Freq: Every morning | ORAL | Status: DC
  Administered 2018-10-12 – 2018-10-14 (×3): 20 mg via ORAL
  Filled 2018-10-12 (×4): qty 1

## 2018-10-12 MED ORDER — INSULIN DETEMIR 100 UNIT/ML ~~LOC~~ SOLN
45.0000 [IU] | Freq: Every day | SUBCUTANEOUS | Status: DC
  Administered 2018-10-13: 45 [IU] via SUBCUTANEOUS
  Filled 2018-10-12 (×4): qty 0.45

## 2018-10-12 MED ORDER — HYDROCODONE-ACETAMINOPHEN 10-325 MG PO TABS
1.0000 | ORAL_TABLET | Freq: Four times a day (QID) | ORAL | Status: DC | PRN
  Administered 2018-10-12 – 2018-10-14 (×4): 1 via ORAL
  Filled 2018-10-12 (×4): qty 1

## 2018-10-12 MED ORDER — LOSARTAN POTASSIUM 50 MG PO TABS
100.0000 mg | ORAL_TABLET | Freq: Every day | ORAL | Status: DC
  Administered 2018-10-12 – 2018-10-14 (×3): 100 mg via ORAL
  Filled 2018-10-12: qty 4
  Filled 2018-10-12 (×2): qty 2

## 2018-10-12 MED ORDER — ONDANSETRON HCL 4 MG PO TABS: 4.0000 mg | ORAL_TABLET | Freq: Four times a day (QID) | ORAL | Status: DC | PRN

## 2018-10-12 MED ORDER — GUAIFENESIN-DM 100-10 MG/5ML PO SYRP: 5.0000 mL | ORAL_SOLUTION | ORAL | Status: DC | PRN

## 2018-10-12 MED ORDER — SODIUM CHLORIDE 0.9 % IV SOLN: INTRAVENOUS | Status: DC

## 2018-10-12 MED ORDER — INSULIN ASPART 100 UNIT/ML ~~LOC~~ SOLN
0.0000 [IU] | Freq: Three times a day (TID) | SUBCUTANEOUS | Status: DC
  Administered 2018-10-12: 2 [IU] via SUBCUTANEOUS
  Administered 2018-10-13 – 2018-10-14 (×3): 1 [IU] via SUBCUTANEOUS

## 2018-10-12 NOTE — Progress Notes (Signed)
PROGRESS NOTE                                                                                                                                                                                                             Patient Demographics:    Roger Lowe, is a 73 y.o. male, DOB - 1946/01/23, RFF:[id number]  Admit date - 10/11/2018   Admitting Physician Oswald Hillock, MD  Outpatient Primary MD for the patient is Patient, No Pcp Per  LOS - 0  Outpatient Specialists: None  Chief Complaint  Patient presents with  . Chest Pain       Brief Narrative   73 year old male with history of type 2 diabetes mellitus, coronary artery disease, history of DVT on anticoagulation presented to the ED with left-sided chest pain associated with shortness of breath.  He also reports having cough with yellow phlegm and occasional hemoptysis.  Reports nausea and vomiting.  Subjective fever with chills but no abdominal pain, bowel or urinary symptoms.  He reports moving from [place] about 2-3 months back.  In the ED was found to have elevated troponin of 0.05, BNP of 499, normal lactic acid.  Chest x-ray with findings of lobar pneumonia, confirmed further on CT angiogram of the chest which showed small bilateral pleural effusion and left lower lobe pneumonia. Patient admitted to telemetry for further management.   Subjective:   Patient still reports feeling short of breath, tachycardic to 120s on the monitor.   Assessment  & Plan :   Principal problem SIRS with lobar pneumonia ([place]) Monitor on telemetry.  Empiric Rocephin.  Holding azithromycin for prolonged QTC.  Follow blood culture, urine strep and Legionella antigen.  Currently stable on 2 L via nasal cannula. Supportive care with Xopenex and Atrovent nebs, antitussives.  Acute respiratory failure with hypoxia (HCC) Secondary to pneumonia.  Stable on 2 L via nasal cannula.  Continue nebs and  monitor.  History of coronary artery disease/uncontrolled hypertension Resume Coreg and hydralazine.  Will place on daily Lasix (takes PRN).  Resume losartan and statin. Patient informed that he was being followed by cardiologist in [place] and was recommended for a cardiac stent which he had declined.  Elevated troponin He reports chest pain and has mildly elevated troponin.  EKG shows sinus tachycardia with PVCs, T wave inversion lateral leads (no old  EKG to compare).  Cycle serial enzymes and obtain 2D echo.  Diabetes mellitus type 2 on insulin Continue Lantus with sliding scale coverage.  History of venous thromboembolism Continue Xarelto.  CT angiogram negative for PE.  Prolonged QTC (515) Discontinued azithromycin and Elavil.  Monitor for now  Code Status : DNR  Family Communication  : None at bedside  Disposition Plan  : Home pending clinical improvement  Barriers For Discharge : Active symptoms  Consults  : None  Procedures  : CT angiogram of the chest , 2D echo  DVT Prophylaxis  :  Lovenox -   Lab Results  Component Value Date   PLT 175 10/12/2018    Antibiotics  :    Anti-infectives (From admission, onward)   Start     Dose/Rate Route Frequency Ordered Stop   10/12/18 1000  cefTRIAXone (ROCEPHIN) 1 g in sodium chloride 0.9 % 100 mL IVPB     1 g 200 mL/hr over 30 Minutes Intravenous Every 24 hours 10/12/18 0031     10/11/18 2245  cefTRIAXone (ROCEPHIN) 2 g in sodium chloride 0.9 % 100 mL IVPB     2 g 200 mL/hr over 30 Minutes Intravenous  Once 10/11/18 2232 10/11/18 2350   10/11/18 2245  azithromycin (ZITHROMAX) 500 mg in sodium chloride 0.9 % 250 mL IVPB  Status:  Discontinued     500 mg 250 mL/hr over 60 Minutes Intravenous Every 24 hours 10/11/18 2232 10/12/18 0843        Objective:   Vitals:   10/12/18 0800 10/12/18 0900 10/12/18 1000 10/12/18 1100  BP: (!) 115/96 (!) 143/94 (!) 153/89 (!) 139/104  Pulse: (!) 118 (!) 121 (!) 103 (!) 119  Resp: (!)  22 19 (!) 27 (!) 23  Temp:      TempSrc:      SpO2: 95% 92% 93% 97%  Weight:    104.2 kg  Height:    6\' 1"  (1.854 m)    Wt Readings from Last 3 Encounters:  10/12/18 104.2 kg     Intake/Output Summary (Last 24 hours) at 10/12/2018 1146 Last data filed at 10/12/2018 1144 Gross per 24 hour  Intake 347.72 ml  Output -  Net 347.72 ml     Physical Exam  Gen: Fatigued, no acute distress HEENT: no pallor, moist mucosa, supple neck Chest: Left lung base crackles, scattered rhonchi CVS: S1 and S2 tachycardic, no murmurs rub or gallop GI: soft, NT, ND, BS+ Musculoskeletal: warm, no edema     Data Review:    CBC Recent Labs  Lab 10/11/18 2151 10/12/18 0631  WBC 12.3* 9.2  HGB 15.0 14.6  HCT 47.4 47.1  PLT 213 175  MCV 92.0 94.6  MCH 29.1 29.3  MCHC 31.6 31.0  RDW 14.4 14.7    Chemistries  Recent Labs  Lab 10/11/18 2151 10/12/18 0631  NA 138 139  K 4.1 4.2  CL 107 108  CO2 21* 22  GLUCOSE 108* 105*  BUN 12 14  CREATININE 1.27* 1.19  CALCIUM 9.5 8.7*  MG  --  2.0  AST  --  16  ALT  --  13  ALKPHOS  --  74  BILITOT  --  0.8   ------------------------------------------------------------------------------------------------------------------ No results for input(s): CHOL, HDL, LDLCALC, TRIG, CHOLHDL, LDLDIRECT in the last 72 hours.  No results found for: HGBA1C ------------------------------------------------------------------------------------------------------------------ No results for input(s): TSH, T4TOTAL, T3FREE, THYROIDAB in the last 72 hours.  Invalid input(s): FREET3 ------------------------------------------------------------------------------------------------------------------ No results for input(s):  VITAMINB12, FOLATE, FERRITIN, TIBC, IRON, RETICCTPCT in the last 72 hours.  Coagulation profile No results for input(s): INR, PROTIME in the last 168 hours.  No results for input(s): DDIMER in the last 72 hours.  Cardiac Enzymes Recent  Labs  Lab 10/11/18 2151 10/12/18 0057 10/12/18 0631  TROPONINI 0.05* 0.06* 0.06*   ------------------------------------------------------------------------------------------------------------------    Component Value Date/Time   BNP 499.0 (H) 10/11/2018 2151    Inpatient Medications  Scheduled Meds: . allopurinol  300 mg Oral Daily  . amLODipine  10 mg Oral Daily  . atorvastatin  20 mg Oral QHS  . carvedilol  25 mg Oral BID  . famotidine  40 mg Oral QHS  . hydrALAZINE  50 mg Oral BID  . insulin aspart  0-9 Units Subcutaneous TID WC  . insulin detemir  45 Units Subcutaneous QHS  . ipratropium  0.5 mg Nebulization Q6H  . levalbuterol  0.63 mg Nebulization Q6H  . losartan  100 mg Oral Daily  . mouth rinse  15 mL Mouth Rinse BID  . rivaroxaban  20 mg Oral q morning - 10a  . sodium chloride flush  3 mL Intravenous Once   Continuous Infusions: . cefTRIAXone (ROCEPHIN)  IV Stopped (10/12/18 1105)   PRN Meds:.HYDROcodone-acetaminophen, ondansetron **OR** ondansetron (ZOFRAN) IV  Micro Results Recent Results (from the past 240 hour(s))  Blood culture (routine x 2)     Status: None (Preliminary result)   Collection Time: 10/11/18 10:50 PM  Result Value Ref Range Status   Specimen Description BLOOD LEFT HAND  Final   Special Requests   Final    BOTTLES DRAWN AEROBIC AND ANAEROBIC Blood Culture adequate volume   Culture   Final    NO GROWTH < 12 HOURS Performed at [facility], [address], [place], [place] [address]    Report Status PENDING  Incomplete  Blood culture (routine x 2)     Status: None (Preliminary result)   Collection Time: 10/11/18 10:53 PM  Result Value Ref Range Status   Specimen Description BLOOD RIGHT HAND  Final   Special Requests   Final    BOTTLES DRAWN AEROBIC AND ANAEROBIC Blood Culture adequate volume   Culture   Final    NO GROWTH < 12 HOURS Performed at [facility], [address], [place], [place] [address]    Report Status PENDING   Incomplete    Radiology Reports Ct Angio Chest Pe W Or Wo Contrast  Result Date: 10/11/2018 CLINICAL DATA:  Left chest pain.  Cough. EXAM: CT ANGIOGRAPHY CHEST WITH CONTRAST TECHNIQUE: Multidetector CT imaging of the chest was performed using the standard protocol during bolus administration of intravenous contrast. Multiplanar CT image reconstructions and MIPs were obtained to evaluate the vascular anatomy. CONTRAST:  159mL ISOVUE-370 IOPAMIDOL (ISOVUE-370) INJECTION 76% COMPARISON:  Chest x-ray 10/11/2018 FINDINGS: Cardiovascular: No filling defects in the pulmonary arteries to suggest pulmonary emboli. Heart is mildly enlarged. Scattered coronary artery and aortic calcifications. Mediastinum/Nodes: Mildly enlarged AP window lymph node measures 12 mm in short axis diameter. Scattered other borderline sized mediastinal and bilateral hilar lymph nodes. No axillary adenopathy. Lungs/Pleura: Small bilateral pleural effusions. Peribronchial thickening in the lower lobes. Right basilar atelectasis. More confluent opacity noted in the left lower lobe concerning for pneumonia. Upper Abdomen: Imaging into the upper abdomen shows no acute findings. Musculoskeletal: Chest wall soft tissues are unremarkable. No acute bony abnormality. Review of the MIP images confirms the above findings. IMPRESSION: Small bilateral pleural effusions, bibasilar atelectasis. More confluent  opacity in the left lower lobe concerning for pneumonia. No evidence of pulmonary embolus. Mildly enlarged and borderline sized mediastinal and bilateral hilar lymph nodes, likely reactive. Mild cardiomegaly. Aortic Atherosclerosis (ICD10-I70.0). Electronically Signed   By: Rolm Baptise M.D.   On: 10/11/2018 23:28   Dg Chest Port 1 View  Result Date: 10/11/2018 CLINICAL DATA:  Left chest pain, nausea, vomiting and fever. EXAM: PORTABLE CHEST 1 VIEW COMPARISON:  None. FINDINGS: There is left basilar airspace disease and a small left effusion. Lucency  is seen subjacent to the right lung base. Heart size is normal. Aortic atherosclerosis is noted. No acute bony abnormality. IMPRESSION: Left effusion and basilar airspace disease worrisome for pneumonia. Thin lucency subjacent to the right lung base. Note is made the patient is scheduled for a CT of the chest this same day. Recommend attention to this finding on that examination. Electronically Signed   By: Inge Rise M.D.   On: 10/11/2018 21:52    Time Spent in minutes  25   Krisi Azua M.D on 10/12/2018 at 11:46 AM  Between 7am to 7pm - Pager - [phone]  After 7pm go to [email or web address] - password [facility]  [facility] -  Office  [phone]

## 2018-10-12 NOTE — H&P (Signed)
TRH H&P    Patient Demographics:    Roger Lowe, is a 73 y.o. male  MRN: [id number]  DOB - February 25, 1946  Admit Date - 10/11/2018  Referring MD/NP/PA: Gerlene Fee  Outpatient Primary MD for the patient is Patient, No Pcp Per  Patient coming from: Home  Chief complaint-chest pain   HPI:    Roger Lowe  is a 73 y.o. male, with history of diabetes mellitus type 2, coronary artery disease, venous thromboembolism came to ED with chief complaint of chest pain.  Patient says the chest pain started 2 days ago also was associated with shortness of breath.  He has been coughing up yellow-colored phlegm with intermittent hemoptysis.  Also complains of nausea and vomiting.  Complains of fever, no abdominal pain, no dysuria.  Patient moved from [place] 2 to 3 months ago.  In the ED patient was found to have elevated troponin 0.05, BNP 499, lactic acid 1.4. Chest x-ray showed community-acquired pneumonia, confirmed with CT chest which showed left lower lobe opacity concerning for pneumonia.  No evidence of pulmonary embolism.   Review of systems:    In addition to the HPI above,    All other systems reviewed and are negative.    Past History of the following :    Past Medical History:  Diagnosis Date  . Asthma   . Cancer ([place])   . Coronary artery disease   . Diabetes mellitus without complication ([place])   . Gout   . Hypertension   . Lung cancer ([place])   . Pneumonia       Past Surgical History:  Procedure Laterality Date  . CERVICAL SPINE SURGERY        Social History:      Social History   Tobacco Use  . Smoking status: Former Research scientist (life sciences)  . Smokeless tobacco: Never Used  Substance Use Topics  . Alcohol use: Not on file       Family History :   Positive family history of CAD.  Patient's father had CAD.  Home Medications:   Prior to Admission medications   Medication Sig Start Date End Date Taking?  Authorizing Provider  allopurinol (ZYLOPRIM) 300 MG tablet Take 300 mg by mouth daily.   Yes [provider]  amitriptyline (ELAVIL) 50 MG tablet Take 50 mg by mouth 2 (two) times daily.   Yes [provider]  amLODipine (NORVASC) 10 MG tablet Take 10 mg by mouth daily.   Yes [provider]  atorvastatin (LIPITOR) 20 MG tablet Take 20 mg by mouth at bedtime.   Yes [provider]  azithromycin (ZITHROMAX) 500 MG tablet Take 500 mg by mouth every Monday, Wednesday, and Friday.   Yes [provider]  carvedilol (COREG) 25 MG tablet Take 25 mg by mouth 2 (two) times daily.   Yes [provider]  famotidine (PEPCID) 40 MG tablet Take 40 mg by mouth at bedtime.   Yes [provider]  furosemide (LASIX) 20 MG tablet Take 20 mg by mouth daily as needed for fluid.  Yes [provider]  hydrALAZINE (APRESOLINE) 50 MG tablet Take 50 mg by mouth 2 (two) times daily.   Yes [provider]  HYDROcodone-acetaminophen (NORCO) 10-325 MG tablet Take 1 tablet by mouth every 6 (six) hours as needed for moderate pain.   Yes [provider]  insulin aspart (NOVOLOG FLEXPEN) 100 UNIT/ML FlexPen Inject 25 Units into the skin 3 (three) times daily with meals. Plus personal sliding scale   Yes [provider]  Insulin Detemir (LEVEMIR FLEXTOUCH) 100 UNIT/ML Pen Inject 45 Units into the skin at bedtime.   Yes [provider]  losartan (COZAAR) 100 MG tablet Take 100 mg by mouth daily.   Yes [provider]  rivaroxaban (XARELTO) 20 MG TABS tablet Take 20 mg by mouth every morning.   Yes [provider]     Allergies:    No Known Allergies   Physical Exam:   Vitals  Blood pressure (!) 160/100, pulse (!) 123, temperature 98.5 F (36.9 C), temperature source Oral, resp. rate (!) 28, height 6\' 1"  (1.854 m), weight 103.4 kg, SpO2 94 %.  1.  General: Appears in no acute distress  2.  Psychiatric: Alert oriented x3, intact insight and judgment  3. Neurologic: Cranial nerve II through XII grossly intact, no focal deficit noted.  4. HEENMT:  Atraumatic normocephalic, extraocular muscles are intact, oral mucosa is pink and moist.  5. Respiratory : Clear to auscultation bilaterally.  No wheezing or crackles.  6. Cardiovascular : S1-S2, regular, no murmur auscultated.  7. Gastrointestinal:  Abdomen soft, nontender, no organomegaly.      Data Review:    CBC Recent Labs  Lab 10/11/18 2151  WBC 12.3*  HGB 15.0  HCT 47.4  PLT 213  MCV 92.0  MCH 29.1  MCHC 31.6  RDW 14.4   ------------------------------------------------------------------------------------------------------------------  Results for orders placed or performed during the hospital encounter of 10/11/18 (from the past 48 hour(s))  Influenza panel by PCR (type A & B)     Status: None   Collection Time: 10/11/18  9:33 PM  Result Value Ref Range   Influenza A By PCR NEGATIVE NEGATIVE   Influenza B By PCR NEGATIVE NEGATIVE    Comment: (NOTE) The Xpert Xpress Flu assay is intended as an aid in the diagnosis of  influenza and should not be used as a sole basis for treatment.  This  assay is FDA approved for nasopharyngeal swab specimens only. Nasal  washings and aspirates are unacceptable for Xpert Xpress Flu testing. Performed at [facility], [address] [place], [place] [address]   Basic metabolic panel     Status: Abnormal   Collection Time: 10/11/18  9:51 PM  Result Value Ref Range   Sodium 138 135 - 145 mmol/L   Potassium 4.1 3.5 - 5.1 mmol/L   Chloride 107 98 - 111 mmol/L   CO2 21 (L) 22 - 32 mmol/L   Glucose, Bld 108 (H) 70 - 99 mg/dL   BUN 12 8 - 23 mg/dL   Creatinine, Ser 1.27 (H) 0.61 - 1.24 mg/dL   Calcium 9.5 8.9 - 10.3 mg/dL   GFR calc non Af Amer 57 (L) >60 mL/min   GFR calc Af Amer >60 >60 mL/min   Anion gap 10 5 - 15    Comment: Performed at [facility], [address], [place], [place] [address]  CBC     Status: Abnormal   Collection Time: 10/11/18  9:51 PM  Result Value  Ref Range   WBC 12.3 (H) 4.0 - 10.5 K/uL   RBC 5.15 4.22 - 5.81 MIL/uL   Hemoglobin 15.0 13.0 - 17.0 g/dL   HCT 47.4 39.0 - 52.0 %   MCV 92.0 80.0 - 100.0 fL   MCH 29.1 26.0 - 34.0 pg   MCHC 31.6 30.0 - 36.0 g/dL   RDW 14.4 11.5 - 15.5 %   Platelets 213 150 - 400 K/uL   nRBC 0.0 0.0 - 0.2 %    Comment: Performed at [facility], [address], [place], [place] [address]  Troponin I - ONCE - STAT     Status: Abnormal   Collection Time: 10/11/18  9:51 PM  Result Value Ref Range   Troponin I 0.05 (HH) <0.03 ng/mL    Comment: CRITICAL RESULT CALLED TO, READ BACK BY AND VERIFIED WITH: BELTON,K ON 10/11/18 AT 2250 BY LOY,C Performed at [facility], [address] [place], [place] [address]   Brain natriuretic peptide     Status: Abnormal   Collection Time: 10/11/18  9:51 PM  Result Value Ref Range   B Natriuretic Peptide 499.0 (H) 0.0 - 100.0 pg/mL    Comment: Performed at [facility], [address], [place], [place] [address]  Lactic acid, plasma     Status: None   Collection Time: 10/11/18 10:14 PM  Result Value Ref Range   Lactic Acid, Venous 1.4 0.5 - 1.9 mmol/L    Comment: Performed at [facility], [address], [place], [place] [address]    Chemistries  Recent Labs  Lab 10/11/18 2151  NA 138  K 4.1  CL 107  CO2 21*  GLUCOSE 108*  BUN 12  CREATININE 1.27*  CALCIUM 9.5   ------------------------------------------------------------------------------------------------------------------  ------------------------------------------------------------------------------------------------------------------ GFR: Estimated Creatinine Clearance: 68.4 mL/min (A) (by C-G formula based on SCr of 1.27 mg/dL (H)). Liver Function Tests: No results for input(s): AST, ALT, ALKPHOS, BILITOT, PROT, ALBUMIN in the last 168 hours. No results for input(s): LIPASE, AMYLASE in the  last 168 hours. No results for input(s): AMMONIA in the last 168 hours. Coagulation Profile: No results for input(s): INR, PROTIME in the last 168 hours. Cardiac Enzymes: Recent Labs  Lab 10/11/18 2151  TROPONINI 0.05*   BNP (last 3 results) No results for input(s): PROBNP in the last 8760 hours. HbA1C: No results for input(s): HGBA1C in the last 72 hours. CBG: No results for input(s): GLUCAP in the last 168 hours. Lipid Profile: No results for input(s): CHOL, HDL, LDLCALC, TRIG, CHOLHDL, LDLDIRECT in the last 72 hours. Thyroid Function Tests: No results for input(s): TSH, T4TOTAL, FREET4, T3FREE, THYROIDAB in the last 72 hours. Anemia Panel: No results for input(s): VITAMINB12, FOLATE, FERRITIN, TIBC, IRON, RETICCTPCT in the last 72 hours.  --------------------------------------------------------------------------------------------------------------- Urine analysis: No results found for: COLORURINE, APPEARANCEUR, LABSPEC, PHURINE, GLUCOSEU, HGBUR, BILIRUBINUR, KETONESUR, PROTEINUR, UROBILINOGEN, NITRITE, LEUKOCYTESUR    Imaging Results:    Ct Angio Chest Pe W Or Wo Contrast  Result Date: 10/11/2018 CLINICAL DATA:  Left chest pain.  Cough. EXAM: CT ANGIOGRAPHY CHEST WITH CONTRAST TECHNIQUE: Multidetector CT imaging of the chest was performed using the standard protocol during bolus administration of intravenous contrast. Multiplanar CT image reconstructions and MIPs were obtained to evaluate the vascular anatomy. CONTRAST:  168mL ISOVUE-370 IOPAMIDOL (ISOVUE-370) INJECTION 76% COMPARISON:  Chest x-ray 10/11/2018 FINDINGS: Cardiovascular: No filling defects in the pulmonary arteries to suggest pulmonary emboli. Heart is mildly enlarged. Scattered coronary artery and aortic calcifications. Mediastinum/Nodes: Mildly enlarged AP window lymph node  measures 12 mm in short axis diameter. Scattered other borderline sized mediastinal and bilateral hilar lymph nodes. No axillary adenopathy.  Lungs/Pleura: Small bilateral pleural effusions. Peribronchial thickening in the lower lobes. Right basilar atelectasis. More confluent opacity noted in the left lower lobe concerning for pneumonia. Upper Abdomen: Imaging into the upper abdomen shows no acute findings. Musculoskeletal: Chest wall soft tissues are unremarkable. No acute bony abnormality. Review of the MIP images confirms the above findings. IMPRESSION: Small bilateral pleural effusions, bibasilar atelectasis. More confluent opacity in the left lower lobe concerning for pneumonia. No evidence of pulmonary embolus. Mildly enlarged and borderline sized mediastinal and bilateral hilar lymph nodes, likely reactive. Mild cardiomegaly. Aortic Atherosclerosis (ICD10-I70.0). Electronically Signed   By: Rolm Baptise M.D.   On: 10/11/2018 23:28   Dg Chest Port 1 View  Result Date: 10/11/2018 CLINICAL DATA:  Left chest pain, nausea, vomiting and fever. EXAM: PORTABLE CHEST 1 VIEW COMPARISON:  None. FINDINGS: There is left basilar airspace disease and a small left effusion. Lucency is seen subjacent to the right lung base. Heart size is normal. Aortic atherosclerosis is noted. No acute bony abnormality. IMPRESSION: Left effusion and basilar airspace disease worrisome for pneumonia. Thin lucency subjacent to the right lung base. Note is made the patient is scheduled for a CT of the chest this same day. Recommend attention to this finding on that examination. Electronically Signed   By: Inge Rise M.D.   On: 10/11/2018 21:52    My personal review of EKG: Rhythm NSR, left bundle branch block   Assessment & Plan:    Active Problems:   CAP (community acquired pneumonia)   1. Community-acquired pneumonia-we will admit patient, start ceftriaxone and Zithromax.  Follow blood culture results.  Will obtain urinary strep pneumo antigen.  Influenza PCR is negative.  2. Chest pain-patient does have underlying history of CAD.  Chest pain has resolved.   Has mild elevation of troponin.  Will cycle troponin every 6 hours x3.  EKG shows left bundle branch block, which patient reports he has a history of.  3. Diabetes mellitus type 2-continue Lantus, sliding scale insulin with NovoLog.  4. Hypertension-blood pressure mild elevated, continue Cozaar, hydralazine, Coreg.  5. Coronary artery disease-patient was followed by cardiologist at [place], says he has been putting of stent placement.  Continue atorvastatin, Coreg.  6. Venous thromboembolism-continue Xarelto    DVT Prophylaxis-   Xarelto  AM Labs Ordered, also please review Full Orders  Family Communication: Admission, patients condition and plan of care including tests being ordered have been discussed with the patient and his brother at bedside who indicate understanding and agree with the plan and Code Status.  Code Status: DNR  Admission status: Observation/Inpatient: Based on patients clinical presentation and evaluation of above clinical data, I have made determination that patient meets inpatient criteria.  Admitted with pneumonia.  Time spent in minutes : 60 minutes   Oswald Hillock M.D on 10/12/2018 at 12:31 AM

## 2018-10-12 NOTE — Progress Notes (Signed)
*  PRELIMINARY RESULTS* Echocardiogram 2D Echocardiogram has been performed.  Roger Lowe 10/12/2018, 2:33 PM

## 2018-10-13 DIAGNOSIS — I10 Essential (primary) hypertension: Secondary | ICD-10-CM

## 2018-10-13 DIAGNOSIS — E119 Type 2 diabetes mellitus without complications: Secondary | ICD-10-CM

## 2018-10-13 DIAGNOSIS — Z794 Long term (current) use of insulin: Secondary | ICD-10-CM

## 2018-10-13 DIAGNOSIS — R0789 Other chest pain: Secondary | ICD-10-CM

## 2018-10-13 DIAGNOSIS — R6510 Systemic inflammatory response syndrome (SIRS) of non-infectious origin without acute organ dysfunction: Secondary | ICD-10-CM

## 2018-10-13 LAB — LEGIONELLA PNEUMOPHILA SEROGP 1 UR AG: L. pneumophila Serogp 1 Ur Ag: NEGATIVE

## 2018-10-13 LAB — BASIC METABOLIC PANEL
Anion gap: 10 (ref 5–15)
BUN: 16 mg/dL (ref 8–23)
CHLORIDE: 105 mmol/L (ref 98–111)
CO2: 22 mmol/L (ref 22–32)
Calcium: 9 mg/dL (ref 8.9–10.3)
Creatinine, Ser: 1.2 mg/dL (ref 0.61–1.24)
GFR calc Af Amer: >60 mL/min (ref >60)
GFR calc non Af Amer: >60 mL/min (ref >60)
Glucose, Bld: 110 mg/dL — ABNORMAL HIGH (ref 70–99)
Potassium: 3.7 mmol/L (ref 3.5–5.1)
Sodium: 137 mmol/L (ref 135–145)

## 2018-10-13 LAB — HIV ANTIBODY (ROUTINE TESTING W REFLEX): HIV Screen 4th Generation wRfx: NONREACTIVE

## 2018-10-13 LAB — GLUCOSE, CAPILLARY
GLUCOSE-CAPILLARY: 134 mg/dL — AB (ref 70–99)
Glucose-Capillary: 129 mg/dL — ABNORMAL HIGH (ref 70–99)
Glucose-Capillary: 135 mg/dL — ABNORMAL HIGH (ref 70–99)
Glucose-Capillary: 113 mg/dL — ABNORMAL HIGH (ref 70–99)

## 2018-10-13 NOTE — Progress Notes (Signed)
Cardiology consult ordered and called to cardiology today by ICU RN per report. Donavan Foil, RN

## 2018-10-13 NOTE — Progress Notes (Signed)
PROGRESS NOTE  Roger Lowe. MBB:[id number] DOB: 03/24/1946 DOA: 10/11/2018 PCP: Patient, No Pcp Per  Brief History:  73 year old male with history of type 2 diabetes mellitus, coronary artery disease, history of DVT on anticoagulation presented to the ED with left-sided chest pain associated with shortness of breath.  He also reports having cough with yellow phlegm and occasional hemoptysis.  Reports nausea and vomiting.  Subjective fever with chills but no abdominal pain, bowel or urinary symptoms.  He reports moving from [place] about 2-3 months back.  In the ED was found to have elevated troponin of 0.05, BNP of 499, normal lactic acid.  Chest x-ray with findings of lobar pneumonia, confirmed further on CT angiogram of the chest which showed small bilateral pleural effusion and left lower lobe pneumonia. Patient admitted to telemetry for further management.  Assessment/Plan: Sepsis with lobar pneumonia ([place]) -present on admission -Empiric Rocephin.   -Holding azithromycin for prolonged QTC.   -Follow blood culture, urine strep and Legionella antigen--all neg   Supportive care with Xopenex and Atrovent nebs, antitussives.  Acute respiratory failure with hypoxia (HCC) Secondary to pneumonia.   -Stable on 2 L via nasal cannula.  Continue nebs and monitor.  History of coronary artery disease/uncontrolled hypertension -Resume Coreg and hydralazine.   -Resume losartan and statin. Patient informed that he was being followed by cardiologist in [place] and was recommended for a cardiac stent which he had declined. -d/c amlodipine due to low EF  Cardiomyopathy -10/12/18 Echo--EF 20-25%, AK mid apicalanterior and anterolateral -continue coreg and losartan -consult cardiology -anticipate repeat echo in 3 months  Elevated troponin He reports chest pain and has mildly elevated troponin.  EKG shows sinus tachycardia with PVCs, T wave inversion lateral leads (no old EKG to compare).    -trend is flat -cardiology consult  Diabetes mellitus type 2 on insulin Continue Lantus with sliding scale coverage.  History of venous thromboembolism Continue Xarelto.  CT angiogram negative for PE.  Prolonged QTC (515) Discontinued azithromycin and Elavil.  Monitor for now     Disposition Plan:   Home in 1-2 days  Family Communication: No  Family at bedside  Consultants:  cardiology  Code Status:  DNR  DVT Prophylaxis:  Xarelto   Procedures: As Listed in Progress Note Above  Antibiotics: Ceftriaxone 2/10>>>       Subjective: Pt states he is breathing better.  He still has nonproductive cough.  He still has some mild dyspnea on exertion.  He denies any fevers, chills, headache, nausea, vomiting, diarrhea, abdominal pain.  He continues to have intermittent chest discomfort as mild to moderate in intensity and substernal in nature.  Objective: Vitals:   10/13/18 1200 10/13/18 1300 10/13/18 1400 10/13/18 1544  BP: 121/79 108/77 (!) 107/59 135/80  Pulse:    100  Resp: (!) 23 (!) 23 20 18   Temp:    97.6 F (36.4 C)  TempSrc:    Oral  SpO2:    94%  Weight:    102.1 kg  Height:        Intake/Output Summary (Last 24 hours) at 10/13/2018 1715 Last data filed at 10/13/2018 1125 Gross per 24 hour  Intake -  Output 1100 ml  Net -1100 ml   Weight change: 0.78 kg Exam:   General:  Pt is alert, follows commands appropriately, not in acute distress  HEENT: No icterus, No thrush, No neck mass, [place]/AT  Cardiovascular: RRR, S1/S2, no rubs, no  gallops  Respiratory: Bibasilar crackles.  No wheezing.  Good air movement  Abdomen: Soft/+BS, non tender, non distended, no guarding  Extremities: No edema, No lymphangitis, No petechiae, No rashes, no synovitis   Data Reviewed: I have personally reviewed following labs and imaging studies Basic Metabolic Panel: Recent Labs  Lab 10/11/18 2151 10/12/18 0631 10/13/18 0430  NA 138 139 137  K 4.1 4.2 3.7  CL  107 108 105  CO2 21* 22 22  GLUCOSE 108* 105* 110*  BUN 12 14 16   CREATININE 1.27* 1.19 1.20  CALCIUM 9.5 8.7* 9.0  MG  --  2.0  --    Liver Function Tests: Recent Labs  Lab 10/12/18 0631  AST 16  ALT 13  ALKPHOS 74  BILITOT 0.8  PROT 7.1  ALBUMIN 3.4*   No results for input(s): LIPASE, AMYLASE in the last 168 hours. No results for input(s): AMMONIA in the last 168 hours. Coagulation Profile: No results for input(s): INR, PROTIME in the last 168 hours. CBC: Recent Labs  Lab 10/11/18 2151 10/12/18 0631  WBC 12.3* 9.2  HGB 15.0 14.6  HCT 47.4 47.1  MCV 92.0 94.6  PLT 213 175   Cardiac Enzymes: Recent Labs  Lab 10/11/18 2151 10/12/18 0057 10/12/18 0631 10/12/18 1401 10/12/18 1820  TROPONINI 0.05* 0.06* 0.06* 0.06* 0.06*   BNP: Invalid input(s): POCBNP CBG: Recent Labs  Lab 10/12/18 1610 10/12/18 2141 10/13/18 0851 10/13/18 1111 10/13/18 1546  GLUCAP 117* 85 129* 135* 113*   HbA1C: Recent Labs    10/12/18 0057  HGBA1C 7.4*   Urine analysis: No results found for: COLORURINE, APPEARANCEUR, LABSPEC, PHURINE, GLUCOSEU, HGBUR, BILIRUBINUR, KETONESUR, PROTEINUR, UROBILINOGEN, NITRITE, LEUKOCYTESUR Sepsis Labs: @LABRCNTIP (procalcitonin:4,lacticidven:4) ) Recent Results (from the past 240 hour(s))  Blood culture (routine x 2)     Status: None (Preliminary result)   Collection Time: 10/11/18 10:50 PM  Result Value Ref Range Status   Specimen Description BLOOD LEFT HAND  Final   Special Requests   Final    BOTTLES DRAWN AEROBIC AND ANAEROBIC Blood Culture adequate volume   Culture   Final    NO GROWTH 2 DAYS Performed at [facility], [address], [place], [place] [address]    Report Status PENDING  Incomplete  Blood culture (routine x 2)     Status: None (Preliminary result)   Collection Time: 10/11/18 10:53 PM  Result Value Ref Range Status   Specimen Description BLOOD RIGHT HAND  Final   Special Requests   Final    BOTTLES DRAWN AEROBIC AND  ANAEROBIC Blood Culture adequate volume   Culture   Final    NO GROWTH 2 DAYS Performed at [facility], [address], [place], [place] [address]    Report Status PENDING  Incomplete  MRSA PCR Screening     Status: None   Collection Time: 10/12/18 11:02 AM  Result Value Ref Range Status   MRSA by PCR NEGATIVE NEGATIVE Final    Comment:        The GeneXpert MRSA Assay (FDA approved for NASAL specimens only), is one component of a comprehensive MRSA colonization surveillance program. It is not intended to diagnose MRSA infection nor to guide or monitor treatment for MRSA infections. Performed at [facility], [address] [place], [place] [address]      Scheduled Meds: . allopurinol  300 mg Oral Daily  . amLODipine  10 mg Oral Daily  . atorvastatin  20 mg Oral QHS  . carvedilol  25  mg Oral BID  . dextromethorphan-guaiFENesin  1 tablet Oral BID  . famotidine  40 mg Oral QHS  . hydrALAZINE  50 mg Oral BID  . insulin aspart  0-9 Units Subcutaneous TID WC  . insulin detemir  45 Units Subcutaneous QHS  . ipratropium  0.5 mg Nebulization Q6H  . levalbuterol  0.63 mg Nebulization Q6H  . losartan  100 mg Oral Daily  . mouth rinse  15 mL Mouth Rinse BID  . rivaroxaban  20 mg Oral q morning - 10a  . sodium chloride flush  3 mL Intravenous Once   Continuous Infusions: . cefTRIAXone (ROCEPHIN)  IV 1 g (10/13/18 1113)    Procedures/Studies: Ct Angio Chest Pe W Or Wo Contrast  Result Date: 10/11/2018 CLINICAL DATA:  Left chest pain.  Cough. EXAM: CT ANGIOGRAPHY CHEST WITH CONTRAST TECHNIQUE: Multidetector CT imaging of the chest was performed using the standard protocol during bolus administration of intravenous contrast. Multiplanar CT image reconstructions and MIPs were obtained to evaluate the vascular anatomy. CONTRAST:  141mL ISOVUE-370 IOPAMIDOL (ISOVUE-370) INJECTION 76% COMPARISON:  Chest x-ray 10/11/2018 FINDINGS: Cardiovascular: No filling defects in the pulmonary  arteries to suggest pulmonary emboli. Heart is mildly enlarged. Scattered coronary artery and aortic calcifications. Mediastinum/Nodes: Mildly enlarged AP window lymph node measures 12 mm in short axis diameter. Scattered other borderline sized mediastinal and bilateral hilar lymph nodes. No axillary adenopathy. Lungs/Pleura: Small bilateral pleural effusions. Peribronchial thickening in the lower lobes. Right basilar atelectasis. More confluent opacity noted in the left lower lobe concerning for pneumonia. Upper Abdomen: Imaging into the upper abdomen shows no acute findings. Musculoskeletal: Chest wall soft tissues are unremarkable. No acute bony abnormality. Review of the MIP images confirms the above findings. IMPRESSION: Small bilateral pleural effusions, bibasilar atelectasis. More confluent opacity in the left lower lobe concerning for pneumonia. No evidence of pulmonary embolus. Mildly enlarged and borderline sized mediastinal and bilateral hilar lymph nodes, likely reactive. Mild cardiomegaly. Aortic Atherosclerosis (ICD10-I70.0). Electronically Signed   By: Rolm Baptise M.D.   On: 10/11/2018 23:28   Dg Chest Port 1 View  Result Date: 10/11/2018 CLINICAL DATA:  Left chest pain, nausea, vomiting and fever. EXAM: PORTABLE CHEST 1 VIEW COMPARISON:  None. FINDINGS: There is left basilar airspace disease and a small left effusion. Lucency is seen subjacent to the right lung base. Heart size is normal. Aortic atherosclerosis is noted. No acute bony abnormality. IMPRESSION: Left effusion and basilar airspace disease worrisome for pneumonia. Thin lucency subjacent to the right lung base. Note is made the patient is scheduled for a CT of the chest this same day. Recommend attention to this finding on that examination. Electronically Signed   By: Inge Rise M.D.   On: 10/11/2018 21:52    Orson Eva, DO  [facility] Pager [phone]  If 7PM-7AM, please contact  night-coverage [email or web address] Password [email or web address] 10/13/2018, 5:15 PM   LOS: 1 day

## 2018-10-14 ENCOUNTER — Encounter (HOSPITAL_COMMUNITY): Payer: Self-pay | Admitting: Student

## 2018-10-14 DIAGNOSIS — E785 Hyperlipidemia, unspecified: Secondary | ICD-10-CM

## 2018-10-14 DIAGNOSIS — Z86711 Personal history of pulmonary embolism: Secondary | ICD-10-CM

## 2018-10-14 DIAGNOSIS — R6511 Systemic inflammatory response syndrome (SIRS) of non-infectious origin with acute organ dysfunction: Secondary | ICD-10-CM

## 2018-10-14 DIAGNOSIS — I2510 Atherosclerotic heart disease of native coronary artery without angina pectoris: Secondary | ICD-10-CM

## 2018-10-14 DIAGNOSIS — I5042 Chronic combined systolic (congestive) and diastolic (congestive) heart failure: Secondary | ICD-10-CM

## 2018-10-14 DIAGNOSIS — R9431 Abnormal electrocardiogram [ECG] [EKG]: Secondary | ICD-10-CM

## 2018-10-14 LAB — BASIC METABOLIC PANEL
ANION GAP: 8 (ref 5–15)
BUN: 17 mg/dL (ref 8–23)
CO2: 25 mmol/L (ref 22–32)
Calcium: 8.9 mg/dL (ref 8.9–10.3)
Chloride: 105 mmol/L (ref 98–111)
Creatinine, Ser: 1.07 mg/dL (ref 0.61–1.24)
GFR calc Af Amer: >60 mL/min (ref >60)
GFR calc non Af Amer: >60 mL/min (ref >60)
Glucose, Bld: 90 mg/dL (ref 70–99)
Potassium: 3.8 mmol/L (ref 3.5–5.1)
Sodium: 138 mmol/L (ref 135–145)

## 2018-10-14 LAB — CBC
HCT: 45.9 % (ref 39.0–52.0)
HEMOGLOBIN: 14.3 g/dL (ref 13.0–17.0)
MCH: 28.4 pg (ref 26.0–34.0)
MCHC: 31.2 g/dL (ref 30.0–36.0)
MCV: 91.1 fL (ref 80.0–100.0)
Platelets: 199 10*3/uL (ref 150–400)
RBC: 5.04 MIL/uL (ref 4.22–5.81)
RDW: 14.6 % (ref 11.5–15.5)
WBC: 7.7 10*3/uL (ref 4.0–10.5)
nRBC: 0 % (ref 0.0–0.2)

## 2018-10-14 LAB — GLUCOSE, CAPILLARY
GLUCOSE-CAPILLARY: 122 mg/dL — AB (ref 70–99)
Glucose-Capillary: 84 mg/dL (ref 70–99)

## 2018-10-14 MED ORDER — IPRATROPIUM-ALBUTEROL 0.5-2.5 (3) MG/3ML IN SOLN: 3.0000 mL | RESPIRATORY_TRACT | 0 refills | Status: DC | PRN

## 2018-10-14 MED ORDER — IPRATROPIUM-ALBUTEROL 0.5-2.5 (3) MG/3ML IN SOLN: 3.0000 mL | RESPIRATORY_TRACT | Status: DC | PRN

## 2018-10-14 MED ORDER — CEFDINIR 300 MG PO CAPS: 300.0000 mg | ORAL_CAPSULE | Freq: Two times a day (BID) | ORAL | 0 refills | Status: DC

## 2018-10-14 MED ORDER — CEFDINIR 300 MG PO CAPS: 300.0000 mg | ORAL_CAPSULE | Freq: Two times a day (BID) | ORAL | Status: DC

## 2018-10-14 MED ORDER — HYDROCODONE-ACETAMINOPHEN 10-325 MG PO TABS: 1.0000 | ORAL_TABLET | Freq: Four times a day (QID) | ORAL | 0 refills | Status: DC | PRN

## 2018-10-14 NOTE — Care Management Note (Addendum)
Case Management Note  Patient Details  Name: Roger Lowe. MRN: [id number] Date of Birth: Feb 10, 1946  Subjective/Objective:   Admitted with SIRS. Pt from home, recently moved from [place]. Pt has insurance but no PCP. Pt does not drive but will have transport to follow up appointments. Pt needs home O2 and to establish with PCP.   Pt given CMS provider options for DME. Has no preference.   hospitalist will sign home O2 orders for 30 days. Pt aware finding new PCP needs to be priority.              Action/Plan: Pt given list of PCP's accepting new pt's. DME referral given to [facility] rep, LInda, who will deliver port O2 to pt room prior to DC.   Expected Discharge Date:    10/16/2018              Expected Discharge Plan:  Home/Self Care  In-House Referral:  NA  Discharge planning Services  CM Consult  Post Acute Care Choice:  Durable Medical Equipment Choice offered to:  Patient  DME Arranged:  Oxygen DME Agency:  [place]:    [facility] Agency:     Status of Service:  Completed, signed off  Sherald Barge, RN 10/14/2018, 11:36 AM

## 2018-10-14 NOTE — Progress Notes (Signed)
SATURATION QUALIFICATIONS: (This note is used to comply with regulatory documentation for home oxygen)  Patient Saturations on Room Air at Rest = 92%  Patient Saturations on Room Air while Ambulating = 86%  Patient Saturations on 2 Liters of oxygen while Ambulating =96%

## 2018-10-14 NOTE — Progress Notes (Signed)
Patient discharged home today per MD orders. Patient vital signs WDL. IV removed and site WDL. Discharge Instructions including follow up appointments, medications, and education reviewed with patient. Patient verbalizes understanding. Patient is transported out via wheelchair.  

## 2018-10-14 NOTE — Consult Note (Addendum)
Cardiology Consult    Patient ID: Roger Lowe.; [id number]; 1946/02/27   Admit date: 10/11/2018 Date of Consult: 10/14/2018  Primary Care Provider: Patient, No Pcp Per Primary Cardiologist: Previously followed by Cardiologist in [place] to [facility]  Patient Profile    Roger Lowe. is a 73 y.o. male with past medical history of CAD, known cardiomyopathy (patient unsure of prior EF but ICD placement had been discussed per his report and he declined), HTN, HLD, Type 2 DM, and history of PE (on Xarelto) who is being seen today for the evaluation of CHF at the request of Dr. Carles Collet.   History of Present Illness    Roger Lowe presented to [facility] ED on 10/11/2018 for evaluation of hemoptysis, fever, nausea, vomiting, and chest pain which had started the day prior to admission.   Initial labs showed WBC 12.3, Hgb 15.0, platelets 213, Na+ 138, K+ 4.1, creatinine 1.27. Lactic acid normal. Initial and cyclic troponin values have been flat at 0.05, 0.06, and 0.06. EKG showed sinus tachycardia, HR 141, with LBBB. CXR showed left effusion and basilar airspace disease concerning for PNA. CTA showed small bilateral pleural effusions and bibasilar atelectasis and no evidence of a pulmonary embolism. Was noted to have mildly enlarged and borderline mediastinal and bilateral hilar lymph nodes which were thought to be reactive.  He was admitted for SIRS in the setting of CAP and initially on Azithromycin which was transitioned to Rocephin due to prolonged QTc. Echocardiogram was obtained and showed a reduced EF of 20 to 25% with mild concentric LVH, akinesis of the mid apical anterior and anterior septal segments and no significant valve abnormalities. Was noted to have moderately reduced RV function.   He was on Amlodipine 10mg  daily, Atorvastatin 20mg  daily, Coreg 25mg  BID, Lasix 20mg  PRN, Hydralazine 50mg  BID, and Losartan 100mg  daily as an outpatient. He has been restarted on Coreg, Hydralazine, and  Losartan with Amlodipine discontinued following echo results.   In talking with the patient today, he reports having two heart catheterizations in the past with the most recent being 6 years ago. Says he was informed he would benefit from a stent at that time but he declined. He is unsure what his EF was by prior studies but says he had been offered ICD placement in the past and declined this as well. Reports compliance with his current cardiac medication regimen and says he was previously on Entresto and is unsure why this was transitioned to Losartan. He has experienced improvement in his respiratory status since admission and denies any recurrent chest pain. Says this was mostly occurring with coughing. Denies any recent exertional symptoms. He moved from [place] to [place] to be closer to family. Is planning to stay here long-term.   Past Medical History:  Diagnosis Date  . Asthma   . Cancer ([place])   . Coronary artery disease   . Diabetes mellitus without complication ([place])   . Gout   . Hypertension   . Lung cancer ([place])   . Pneumonia     Past Surgical History:  Procedure Laterality Date  . CERVICAL SPINE SURGERY       Home Medications:  Prior to Admission medications   Medication Sig Start Date End Date Taking? Authorizing Provider  allopurinol (ZYLOPRIM) 300 MG tablet Take 300 mg by mouth daily.   Yes [provider]  amitriptyline (ELAVIL) 50 MG tablet Take 50 mg by mouth 2 (two) times daily.   Yes  [provider]  amLODipine (NORVASC) 10 MG tablet Take 10 mg by mouth daily.   Yes [provider]  atorvastatin (LIPITOR) 20 MG tablet Take 20 mg by mouth at bedtime.   Yes [provider]  azithromycin (ZITHROMAX) 500 MG tablet Take 500 mg by mouth every Monday, Wednesday, and Friday.   Yes [provider]  carvedilol (COREG) 25 MG tablet Take 25 mg by mouth 2 (two) times daily.   Yes [provider]  famotidine (PEPCID)  40 MG tablet Take 40 mg by mouth at bedtime.   Yes [provider]  furosemide (LASIX) 20 MG tablet Take 20 mg by mouth daily as needed for fluid.   Yes [provider]  hydrALAZINE (APRESOLINE) 50 MG tablet Take 50 mg by mouth 2 (two) times daily.   Yes [provider]  HYDROcodone-acetaminophen (NORCO) 10-325 MG tablet Take 1 tablet by mouth every 6 (six) hours as needed for moderate pain.   Yes [provider]  insulin aspart (NOVOLOG FLEXPEN) 100 UNIT/ML FlexPen Inject 25 Units into the skin 3 (three) times daily with meals. Plus personal sliding scale   Yes [provider]  Insulin Detemir (LEVEMIR FLEXTOUCH) 100 UNIT/ML Pen Inject 45 Units into the skin at bedtime.   Yes [provider]  losartan (COZAAR) 100 MG tablet Take 100 mg by mouth daily.   Yes [provider]  rivaroxaban (XARELTO) 20 MG TABS tablet Take 20 mg by mouth every morning.   Yes [provider]    Inpatient Medications: Scheduled Meds: . allopurinol  300 mg Oral Daily  . atorvastatin  20 mg Oral QHS  . carvedilol  25 mg Oral BID  . dextromethorphan-guaiFENesin  1 tablet Oral BID  . famotidine  40 mg Oral QHS  . hydrALAZINE  50 mg Oral BID  . insulin aspart  0-9 Units Subcutaneous TID WC  . insulin detemir  45 Units Subcutaneous QHS  . ipratropium  0.5 mg Nebulization Q6H  . levalbuterol  0.63 mg Nebulization Q6H  . losartan  100 mg Oral Daily  . mouth rinse  15 mL Mouth Rinse BID  . rivaroxaban  20 mg Oral q morning - 10a  . sodium chloride flush  3 mL Intravenous Once   Continuous Infusions: . cefTRIAXone (ROCEPHIN)  IV 1 g (10/13/18 1113)   PRN Meds: guaiFENesin-dextromethorphan, HYDROcodone-acetaminophen, ondansetron **OR** ondansetron (ZOFRAN) IV  Allergies:   No Known Allergies  Social History:   Social History   Socioeconomic History  . Marital status: Single    Spouse name: Not on file  . Number of children: Not on file    . Years of education: Not on file  . Highest education level: Not on file  Occupational History  . Not on file  Social Needs  . Financial resource strain: Not on file  . Food insecurity:    Worry: Not on file    Inability: Not on file  . Transportation needs:    Medical: Not on file    Non-medical: Not on file  Tobacco Use  . Smoking status: Former Research scientist (life sciences)  . Smokeless tobacco: Never Used  Substance and Sexual Activity  . Alcohol use: Not on file  . Drug use: Not on file  . Sexual activity: Not on file  Lifestyle  . Physical activity:    Days per week: Not on file    Minutes per session: Not on file  . Stress: Not on file  Relationships  .  Social connections:    Talks on phone: Not on file    Gets together: Not on file    Attends religious service: Not on file    Active member of club or organization: Not on file    Attends meetings of clubs or organizations: Not on file    Relationship status: Not on file  . Intimate partner violence:    Fear of current or ex partner: Not on file    Emotionally abused: Not on file    Physically abused: Not on file    Forced sexual activity: Not on file  Other Topics Concern  . Not on file  Social History Narrative  . Not on file     Family History:    Family History  Problem Relation Age of Onset  . CAD Brother       Review of Systems    General:  No chills, fever, night sweats or weight changes.  Cardiovascular:  No edema, orthopnea, palpitations, paroxysmal nocturnal dyspnea. Positive for chest pain and dyspnea (now improved).  Dermatological: No rash, lesions/masses Respiratory: No cough, dyspnea Urologic: No hematuria, dysuria Abdominal:   No nausea, vomiting, diarrhea, bright red blood per rectum, melena, or hematemesis Neurologic:  No visual changes, wkns, changes in mental status. All other systems reviewed and are otherwise negative except as noted above.  Physical Exam/Data    Vitals:   10/13/18 1942  10/14/18 0121 10/14/18 0428 10/14/18 0754  BP:   138/81   Pulse:      Resp:   16   Temp:   98.3 F (36.8 C)   TempSrc:   Oral   SpO2: 96% 97%  93%  Weight:      Height:        Intake/Output Summary (Last 24 hours) at 10/14/2018 0905 Last data filed at 10/13/2018 1820 Gross per 24 hour  Intake 120 ml  Output 150 ml  Net -30 ml   Filed Weights   10/12/18 1100 10/13/18 0500 10/13/18 1544  Weight: 104.2 kg 100.8 kg 102.1 kg   Body mass index is 29.7 kg/m.   General: Pleasant, African American male appearing in NAD Psych: Normal affect. Neuro: Alert and oriented X 3. Moves all extremities spontaneously. HEENT: Normal  Neck: Supple without bruits or JVD. Lungs:  Resp regular and unlabored, mild rhonchi along upper lung fields. No wheezing.  Heart: RRR no s3, s4, or murmurs. Abdomen: Soft, non-tender, non-distended, BS + x 4.  Extremities: No clubbing, cyanosis or lower extremity edema. DP/PT/Radials 2+ and equal bilaterally.   Labs/Studies     Relevant CV Studies:  Echocardiogram: 10/12/2018 IMPRESSIONS  1. The left ventricle has severely reduced systolic function of 54-62%. The cavity size was normal. There is mild concentric left ventricular hypertrophy. Indeterminate diastolic function.  2. There is akinesis of the mid-apical anterior and anteroseptal left ventricular segments. Overall septal motion suggests left bundle Kahealani Yankovich block.  3. The aortic valve is tricuspid There is mild aortic annular calcification noted.  4. The mitral valve is normal in structure. There is mild calcification.  5. The tricuspid valve is normal in structure.  6. The aortic root is normal in size and structure.  7. Right atrial size was mildly dilated.  8. The right ventricle has moderately reduced systolic function. The cavity was normal. There is no increase in right ventricular wall thickness. Right ventricular systolic pressure could not be assessed.  Laboratory Data:  Chemistry Recent  Labs  Lab 10/12/18 0631 10/13/18 0430  10/14/18 0637  NA 139 137 138  K 4.2 3.7 3.8  CL 108 105 105  CO2 22 22 25   GLUCOSE 105* 110* 90  BUN 14 16 17   CREATININE 1.19 1.20 1.07  CALCIUM 8.7* 9.0 8.9  GFRNONAA >60 >60 >60  GFRAA >60 >60 >60  ANIONGAP 9 10 8     Recent Labs  Lab 10/12/18 0631  PROT 7.1  ALBUMIN 3.4*  AST 16  ALT 13  ALKPHOS 74  BILITOT 0.8   Hematology Recent Labs  Lab 10/11/18 2151 10/12/18 0631 10/14/18 0637  WBC 12.3* 9.2 7.7  RBC 5.15 4.98 5.04  HGB 15.0 14.6 14.3  HCT 47.4 47.1 45.9  MCV 92.0 94.6 91.1  MCH 29.1 29.3 28.4  MCHC 31.6 31.0 31.2  RDW 14.4 14.7 14.6  PLT 213 175 199   Cardiac Enzymes Recent Labs  Lab 10/11/18 2151 10/12/18 0057 10/12/18 0631 10/12/18 1401 10/12/18 1820  TROPONINI 0.05* 0.06* 0.06* 0.06* 0.06*   No results for input(s): TROPIPOC in the last 168 hours.  BNP Recent Labs  Lab 10/11/18 2151  BNP 499.0*    DDimer No results for input(s): DDIMER in the last 168 hours.  Radiology/Studies:  Ct Angio Chest Pe W Or Wo Contrast  Result Date: 10/11/2018 CLINICAL DATA:  Left chest pain.  Cough. EXAM: CT ANGIOGRAPHY CHEST WITH CONTRAST TECHNIQUE: Multidetector CT imaging of the chest was performed using the standard protocol during bolus administration of intravenous contrast. Multiplanar CT image reconstructions and MIPs were obtained to evaluate the vascular anatomy. CONTRAST:  173mL ISOVUE-370 IOPAMIDOL (ISOVUE-370) INJECTION 76% COMPARISON:  Chest x-ray 10/11/2018 FINDINGS: Cardiovascular: No filling defects in the pulmonary arteries to suggest pulmonary emboli. Heart is mildly enlarged. Scattered coronary artery and aortic calcifications. Mediastinum/Nodes: Mildly enlarged AP window lymph node measures 12 mm in short axis diameter. Scattered other borderline sized mediastinal and bilateral hilar lymph nodes. No axillary adenopathy. Lungs/Pleura: Small bilateral pleural effusions. Peribronchial thickening in the  lower lobes. Right basilar atelectasis. More confluent opacity noted in the left lower lobe concerning for pneumonia. Upper Abdomen: Imaging into the upper abdomen shows no acute findings. Musculoskeletal: Chest wall soft tissues are unremarkable. No acute bony abnormality. Review of the MIP images confirms the above findings. IMPRESSION: Small bilateral pleural effusions, bibasilar atelectasis. More confluent opacity in the left lower lobe concerning for pneumonia. No evidence of pulmonary embolus. Mildly enlarged and borderline sized mediastinal and bilateral hilar lymph nodes, likely reactive. Mild cardiomegaly. Aortic Atherosclerosis (ICD10-I70.0). Electronically Signed   By: Rolm Baptise M.D.   On: 10/11/2018 23:28   Dg Chest Port 1 View  Result Date: 10/11/2018 CLINICAL DATA:  Left chest pain, nausea, vomiting and fever. EXAM: PORTABLE CHEST 1 VIEW COMPARISON:  None. FINDINGS: There is left basilar airspace disease and a small left effusion. Lucency is seen subjacent to the right lung base. Heart size is normal. Aortic atherosclerosis is noted. No acute bony abnormality. IMPRESSION: Left effusion and basilar airspace disease worrisome for pneumonia. Thin lucency subjacent to the right lung base. Note is made the patient is scheduled for a CT of the chest this same day. Recommend attention to this finding on that examination. Electronically Signed   By: Inge Rise M.D.   On: 10/11/2018 21:52     Assessment & Plan    1. Chronic Combined Systolic and Diastolic CHF - Echocardiogram this admission shows a reduced EF of 20 to 25% with wall motion abnormalities as outlined above and moderately reduced RV function.  Suspect his cardiomyopathy has been present for several years as he reports being offered ICD placement by his prior cardiologist in [place] but declined. - His volume status appears stable upon examination and he has been restarted on his outpatient regimen including Coreg, Losartan, and  Hydralazine. Was previously on Entresto but is unsure why this was transitioned back to Losartan. Would consider restarting as an outpatient if he is in agreement. - Given known CAD and with prior stent placement having been recommended, suspect his cardiomyopathy is likely ischemic. He is still not open to invasive procedures at this time but says he will "think about it". We also briefly discussed ICD placement and why this had been recommended in the past. He would benefit from CRT-D in the setting of his cardiomyopathy and LBBB if in agreement in the future.   2. CAD - He reports a history of known CAD and says stent placement was previously recommended 6+ years ago but he declined at that time. Was having chest pain on admission which sounded more pleuritic in etiology and denies any recurrent pain at this time. - Cyclic enzymes have been flat, peaking at 0.06, which is most consistent with demand ischemia in the setting of his acute illness. Would not anticipate inpatient ischemic evaluation. We did discuss options including a R/LHC due to his cardiomyopathy as an outpatient and he says he "will think about it".  - Continue beta-blocker and statin therapy. He is not on ASA as an outpatient given the need for anticoagulation.  3. HTN - BP has been well controlled at 107/59 - 143/91 within the past 24 hours. - Continue Coreg, Hydralazine, and Losartan.  4. HLD - Will recheck FLP as he reports not having lab work in over 6 months. Has been continued on PTA Atorvastatin 20 mg daily. LFT's WNL when checked on 10/12/2018.  5. History of PE - CTA upon admission negative for PE. He has been continued on Xarelto 20 mg daily.  6. SIRS with CAP - Remains on antibiotic therapy. Per admitting team.   For questions or updates, please contact [place] Please consult [email or web address] for contact info under Cardiology/STEMI.  Signed, Erma Heritage, PA-C 10/14/2018, 9:05 AM Pager:  [phone]   Attending note Patient seen and discussed with PA Ahmed Prima, I agree with her documentation above. 73 yo male history of DM2, CAD, prior DVT presented with chest pain. Also with cough with yellow phlegm and hemoptysis, N/V, fever. Admitted with pneumonia.     Admit labs Flu neg BNP 499 WBC 12.3 Hgb 15 Plt 213 K 4.1 Cr 1.27 Lactic acid 1.4  Trop 0.05-->0.06-->0.06-->0.06-->0.06 CXR left effusion, basilar airspace disease CT PE small bilateral effusions, LLL opacity, no PE EKG sinus tach with LBBB(presumed old) 10/2018 echo LVEF 20-25%, apical akinesis, moderate RV dysfunction.    Patient presents with pneumonia. Flat trop not consistent with ACS. Duration/history of his CAD, LBBB, and cardiomyopathy are unclear but data would suggest no acute process. Appears he refused prior stents in the past, resistant to invasive procedures at this time. He reports several year history of DOE with simple activities like showering at home.   Medical therapy with coreg 25 mg bid, atorva 20, losartan 100. No ASA since on xarelto. Consider entresto as outpatient if he decised to establish with [place]. From [facility] records listed below LVEF was 45% in 2016, significant decrease. Would plan outpatient RHC/LHC once recovered from pneumonia, at this time he is reluctant but will consider. No plans  for additional inpatient testing or therapies, we will sign off inpatient care.    Addendum Able to access records from [facility] on [facility] everywhere, see below test results. . Clinic note 12/2017 reports LVEF 45% by echo. He had LBBB at that time. Cath mild nonobstructive disease in 2016.   Echo(EF 9.31, diastolic fxn indeterminate) 01-07-2018 Echo(EF 0.45-0.50, mild to mod MR) 12-06-2014 Echo(EF 0.55-0.60) 06/20/13  Electrophysiology ECG - NSR with LBBB   [place] 12-15-2014 Sinus Rhythm, sinus tachycardia, rare PVCs LBBB  Stress test: [facility] MPI 09-06-2013 Small fixed defect of the mid and apical  septum LVEF 70%   Cardiac cath Cath(EF 0.50, +1-+2 MR, Lt main- mild irreg, LAD - no significant dz, LCx- prox irreg, Ramus small vessel ostial 30%, OM1 B vessel ostial 30%, mid 30%, OM2 small vessel ostial 40%, OM3 small vessel,RCA - dominant; no significant dz; PDA C vessel 12-27-2014    Carlyle Dolly MD   [facility] will sign off.   Medication Recommendations:  No changes Other recommendations (labs, testing, etc):  none Follow up as an outpatient:  We will arrange 2-3 weeks

## 2018-10-14 NOTE — Care Management (Signed)
[facility] unable to accept [facility]. Referral sent to [facility]. Per Ashly, they will accept referral. Port device will be delivered to pt room later today.

## 2018-10-14 NOTE — Discharge Summary (Signed)
Physician Discharge Summary  Roger Lowe. WUJ:[id number] DOB: Jan 08, 1946 DOA: 10/11/2018  PCP: Patient, No Pcp Per  Admit date: 10/11/2018 Discharge date: 10/14/2018  Admitted From: Home Disposition:  Home   Recommendations for Outpatient Follow-up:  1. Follow up with PCP in 1-2 weeks 2. Please obtain BMP/CBC in one week   Home Health: YES Equipment/Devices: 2L [place]  Discharge Condition: Stable CODE STATUS: DNR Diet recommendation: Heart Healthy    Brief/Interim Summary: 73 year old male with history of type 2 diabetes mellitus, coronary artery disease, history of DVT on anticoagulation presented to the ED with left-sided chest pain associated with shortness of breath. He also reports having cough with yellow phlegm and occasional hemoptysis. Reports nausea and vomiting. Subjective fever with chills but no abdominal pain, bowel or urinary symptoms. He reports moving from [place] about 2-3 months back. In the ED was found to have elevated troponin of 0.05, BNP of 499, normal lactic acid. Chest x-ray with findings of lobar pneumonia, confirmed further on CT angiogram of the chest which showed small bilateral pleural effusion and left lower lobe pneumonia. Patient admitted to telemetry for further management.  Discharge Diagnoses:  Sepsis with lobar pneumonia ([facility]) -present on admission -Empiric Rocephin.  -Holding azithromycin for prolonged QTC.  -Follow blood culture, urine strep and Legionella antigen--all neg  Supportive care with Xopenex and Atrovent nebs, antitussives. -d/c home with cefdinir x 4 more days to complete 1 week  Acute respiratory failure withhypoxia ([facility]) Secondary to pneumonia.  -Stable on 2 L via nasal cannula.  -ambulatory pulse ox on RA <88%-->d/c home with 2L [facility]  History of coronary artery disease/uncontrolled hypertension -Resume Coreg and hydralazine.  -Resume losartan and statin. Patient informed that he was being followed by  cardiologist in [place] and was recommended for a cardiac stent which he had declined. -d/c amlodipine due to low EF--pt was not taking it anyway prior to admission  Cardiomyopathy--Chronic diastolic and systolic CHF -9/56/21 Echo--EF 20-25%, AK mid apicalanterior and anterolateral -continue coreg and losartan -consult cardiology-appreciated-->outpt cath once recovered from [facility] -Echo(EF 3.08, diastolic fxn indeterminate) 01-07-2018 -cath 12/27/14--nonobstructive  Elevated troponin He reports chest pain and has mildly elevated troponin. EKG shows sinus tachycardia with PVCs, T wave inversion lateral leads (no old EKG to compare).  -trend is flat -cardiology consult appeciated  Diabetes mellitus type 2 on insulin Continue Levemir with sliding scale coverage.  History of venous thromboembolism/PE Continue Xarelto. CT angiogram negative for PE.  Prolonged QTC (515) Discontinued azithromycin and Elavil. Monitor for now    Discharge Instructions   Allergies as of 10/14/2018   No Known Allergies     Medication List    STOP taking these medications   amLODipine 10 MG tablet Commonly known as:  NORVASC   azithromycin 500 MG tablet Commonly known as:  ZITHROMAX     TAKE these medications   allopurinol 300 MG tablet Commonly known as:  ZYLOPRIM Take 300 mg by mouth daily.   amitriptyline 50 MG tablet Commonly known as:  ELAVIL Take 50 mg by mouth 2 (two) times daily.   atorvastatin 20 MG tablet Commonly known as:  LIPITOR Take 20 mg by mouth at bedtime.   carvedilol 25 MG tablet Commonly known as:  COREG Take 25 mg by mouth 2 (two) times daily.   famotidine 40 MG tablet Commonly known as:  PEPCID Take 40 mg by mouth at bedtime.   furosemide 20 MG tablet Commonly known as:  LASIX Take 20 mg by mouth daily as needed  for fluid.   hydrALAZINE 50 MG tablet Commonly known as:  APRESOLINE Take 50 mg by mouth 2 (two) times daily.   HYDROcodone-acetaminophen  10-325 MG tablet Commonly known as:  NORCO Take 1 tablet by mouth every 6 (six) hours as needed for moderate pain.   LEVEMIR FLEXTOUCH 100 UNIT/ML Pen Generic drug:  Insulin Detemir Inject 45 Units into the skin at bedtime.   losartan 100 MG tablet Commonly known as:  COZAAR Take 100 mg by mouth daily.   NOVOLOG FLEXPEN 100 UNIT/ML FlexPen Generic drug:  insulin aspart Inject 25 Units into the skin 3 (three) times daily with meals. Plus personal sliding scale   rivaroxaban 20 MG Tabs tablet Commonly known as:  XARELTO Take 20 mg by mouth every morning.            Durable Medical Equipment  (From admission, onward)         Start     Ordered   10/14/18 1132  For home use only DME oxygen  Once    Comments:  Portable concentrator  Question Answer Comment  Mode or (Route) Nasal cannula   Liters per Minute 2   Frequency Continuous (stationary and portable oxygen unit needed)   Oxygen delivery system Gas      10/14/18 1131         Follow-up Information    Erma Heritage, PA-C Follow up on 11/03/2018.   Specialties:  Physician Assistant, Cardiology Why:  [place] on 11/03/2018 at 3:30 PM with Bernerd Pho, PA-C (works with Dr. Harl Bowie).  Contact information: [place] [address] [phone]          No Known Allergies  Consultations:  cardiology   Procedures/Studies: Ct Angio Chest Pe W Or Wo Contrast  Result Date: 10/11/2018 CLINICAL DATA:  Left chest pain.  Cough. EXAM: CT ANGIOGRAPHY CHEST WITH CONTRAST TECHNIQUE: Multidetector CT imaging of the chest was performed using the standard protocol during bolus administration of intravenous contrast. Multiplanar CT image reconstructions and MIPs were obtained to evaluate the vascular anatomy. CONTRAST:  153mL ISOVUE-370 IOPAMIDOL (ISOVUE-370) INJECTION 76% COMPARISON:  Chest x-ray 10/11/2018 FINDINGS: Cardiovascular: No filling defects in the pulmonary arteries to suggest  pulmonary emboli. Heart is mildly enlarged. Scattered coronary artery and aortic calcifications. Mediastinum/Nodes: Mildly enlarged AP window lymph node measures 12 mm in short axis diameter. Scattered other borderline sized mediastinal and bilateral hilar lymph nodes. No axillary adenopathy. Lungs/Pleura: Small bilateral pleural effusions. Peribronchial thickening in the lower lobes. Right basilar atelectasis. More confluent opacity noted in the left lower lobe concerning for pneumonia. Upper Abdomen: Imaging into the upper abdomen shows no acute findings. Musculoskeletal: Chest wall soft tissues are unremarkable. No acute bony abnormality. Review of the MIP images confirms the above findings. IMPRESSION: Small bilateral pleural effusions, bibasilar atelectasis. More confluent opacity in the left lower lobe concerning for pneumonia. No evidence of pulmonary embolus. Mildly enlarged and borderline sized mediastinal and bilateral hilar lymph nodes, likely reactive. Mild cardiomegaly. Aortic Atherosclerosis (ICD10-I70.0). Electronically Signed   By: Rolm Baptise M.D.   On: 10/11/2018 23:28   Dg Chest Port 1 View  Result Date: 10/11/2018 CLINICAL DATA:  Left chest pain, nausea, vomiting and fever. EXAM: PORTABLE CHEST 1 VIEW COMPARISON:  None. FINDINGS: There is left basilar airspace disease and a small left effusion. Lucency is seen subjacent to the right lung base. Heart size is normal. Aortic atherosclerosis is noted. No acute bony abnormality. IMPRESSION: Left effusion and basilar  airspace disease worrisome for pneumonia. Thin lucency subjacent to the right lung base. Note is made the patient is scheduled for a CT of the chest this same day. Recommend attention to this finding on that examination. Electronically Signed   By: Inge Rise M.D.   On: 10/11/2018 21:52        Discharge Exam: Vitals:   10/14/18 0428 10/14/18 0754  BP: 138/81   Pulse:    Resp: 16   Temp: 98.3 F (36.8 C)   SpO2:   93%   Vitals:   10/13/18 1942 10/14/18 0121 10/14/18 0428 10/14/18 0754  BP:   138/81   Pulse:      Resp:   16   Temp:   98.3 F (36.8 C)   TempSrc:   Oral   SpO2: 96% 97%  93%  Weight:      Height:        General: Pt is alert, awake, not in acute distress Cardiovascular: RRR, S1/S2 +, no rubs, no gallops Respiratory: bibasilar rales, R>L, no wheeze Abdominal: Soft, NT, ND, bowel sounds + Extremities: no edema, no cyanosis   The results of significant diagnostics from this hospitalization (including imaging, microbiology, ancillary and laboratory) are listed below for reference.    Significant Diagnostic Studies: Ct Angio Chest Pe W Or Wo Contrast  Result Date: 10/11/2018 CLINICAL DATA:  Left chest pain.  Cough. EXAM: CT ANGIOGRAPHY CHEST WITH CONTRAST TECHNIQUE: Multidetector CT imaging of the chest was performed using the standard protocol during bolus administration of intravenous contrast. Multiplanar CT image reconstructions and MIPs were obtained to evaluate the vascular anatomy. CONTRAST:  177mL ISOVUE-370 IOPAMIDOL (ISOVUE-370) INJECTION 76% COMPARISON:  Chest x-ray 10/11/2018 FINDINGS: Cardiovascular: No filling defects in the pulmonary arteries to suggest pulmonary emboli. Heart is mildly enlarged. Scattered coronary artery and aortic calcifications. Mediastinum/Nodes: Mildly enlarged AP window lymph node measures 12 mm in short axis diameter. Scattered other borderline sized mediastinal and bilateral hilar lymph nodes. No axillary adenopathy. Lungs/Pleura: Small bilateral pleural effusions. Peribronchial thickening in the lower lobes. Right basilar atelectasis. More confluent opacity noted in the left lower lobe concerning for pneumonia. Upper Abdomen: Imaging into the upper abdomen shows no acute findings. Musculoskeletal: Chest wall soft tissues are unremarkable. No acute bony abnormality. Review of the MIP images confirms the above findings. IMPRESSION: Small bilateral  pleural effusions, bibasilar atelectasis. More confluent opacity in the left lower lobe concerning for pneumonia. No evidence of pulmonary embolus. Mildly enlarged and borderline sized mediastinal and bilateral hilar lymph nodes, likely reactive. Mild cardiomegaly. Aortic Atherosclerosis (ICD10-I70.0). Electronically Signed   By: Rolm Baptise M.D.   On: 10/11/2018 23:28   Dg Chest Port 1 View  Result Date: 10/11/2018 CLINICAL DATA:  Left chest pain, nausea, vomiting and fever. EXAM: PORTABLE CHEST 1 VIEW COMPARISON:  None. FINDINGS: There is left basilar airspace disease and a small left effusion. Lucency is seen subjacent to the right lung base. Heart size is normal. Aortic atherosclerosis is noted. No acute bony abnormality. IMPRESSION: Left effusion and basilar airspace disease worrisome for pneumonia. Thin lucency subjacent to the right lung base. Note is made the patient is scheduled for a CT of the chest this same day. Recommend attention to this finding on that examination. Electronically Signed   By: Inge Rise M.D.   On: 10/11/2018 21:52     Microbiology: Recent Results (from the past 240 hour(s))  Blood culture (routine x 2)     Status: None (Preliminary result)  Collection Time: 10/11/18 10:50 PM  Result Value Ref Range Status   Specimen Description BLOOD LEFT HAND  Final   Special Requests   Final    BOTTLES DRAWN AEROBIC AND ANAEROBIC Blood Culture adequate volume   Culture   Final    NO GROWTH 3 DAYS Performed at [facility], [address], [place], [place] [address]    Report Status PENDING  Incomplete  Blood culture (routine x 2)     Status: None (Preliminary result)   Collection Time: 10/11/18 10:53 PM  Result Value Ref Range Status   Specimen Description BLOOD RIGHT HAND  Final   Special Requests   Final    BOTTLES DRAWN AEROBIC AND ANAEROBIC Blood Culture adequate volume   Culture   Final    NO GROWTH 3 DAYS Performed at [facility], [address]  [place], [place] [address]    Report Status PENDING  Incomplete  MRSA PCR Screening     Status: None   Collection Time: 10/12/18 11:02 AM  Result Value Ref Range Status   MRSA by PCR NEGATIVE NEGATIVE Final    Comment:        The GeneXpert MRSA Assay (FDA approved for NASAL specimens only), is one component of a comprehensive MRSA colonization surveillance program. It is not intended to diagnose MRSA infection nor to guide or monitor treatment for MRSA infections. Performed at [facility], [address] [place], [place] [address]      Labs: Basic Metabolic Panel: Recent Labs  Lab 10/11/18 2151 10/12/18 0631 10/13/18 0430 10/14/18 0637  NA 138 139 137 138  K 4.1 4.2 3.7 3.8  CL 107 108 105 105  CO2 21* 22 22 25   GLUCOSE 108* 105* 110* 90  BUN 12 14 16 17   CREATININE 1.27* 1.19 1.20 1.07  CALCIUM 9.5 8.7* 9.0 8.9  MG  --  2.0  --   --    Liver Function Tests: Recent Labs  Lab 10/12/18 0631  AST 16  ALT 13  ALKPHOS 74  BILITOT 0.8  PROT 7.1  ALBUMIN 3.4*   No results for input(s): LIPASE, AMYLASE in the last 168 hours. No results for input(s): AMMONIA in the last 168 hours. CBC: Recent Labs  Lab 10/11/18 2151 10/12/18 0631 10/14/18 0637  WBC 12.3* 9.2 7.7  HGB 15.0 14.6 14.3  HCT 47.4 47.1 45.9  MCV 92.0 94.6 91.1  PLT 213 175 199   Cardiac Enzymes: Recent Labs  Lab 10/11/18 2151 10/12/18 0057 10/12/18 0631 10/12/18 1401 10/12/18 1820  TROPONINI 0.05* 0.06* 0.06* 0.06* 0.06*   BNP: Invalid input(s): POCBNP CBG: Recent Labs  Lab 10/13/18 1111 10/13/18 1546 10/13/18 2021 10/14/18 0750 10/14/18 1114  GLUCAP 135* 113* 134* 84 122*    Time coordinating discharge:  36 minutes  Signed:  Orson Eva, DO [facility] Pager: [phone] 10/14/2018, 12:09 PM

## 2018-10-16 LAB — CULTURE, BLOOD (ROUTINE X 2)
CULTURE: NO GROWTH
Culture: NO GROWTH
Special Requests: ADEQUATE
Special Requests: ADEQUATE

## 2018-11-02 NOTE — Progress Notes (Signed)
Cardiology Office Note    Date:  11/03/2018   ID:  Roger China., DOB 06-10-1946, MRN [id number]  PCP:  Patient, No Pcp Per  Cardiologist: Carlyle Dolly, MD    Chief Complaint  Patient presents with  . Hospitalization Follow-up    History of Present Illness:    Roger Martis. is a 73 y.o. male with past medical history of CAD (cath in 2016 showing mild nonobstructive disease), chronic combined systolic and diastolic CHF (EF 14-43% by echo in 07/2017), COPD, HTN, HLD, Type 2 DM, and history of PE (on Xarelto) who presents to the office today for hospital follow-up.   He was most recently admitted to [facility] from 2/10 - 10/14/2018 for worsening dyspnea with an associated productive cough, found to have sepsis secondary to lobar PNA. His respiratory status improved with the use of antibiotics and scheduled nebulizer treatments. Troponin values were flat at 0.06 during admission and echocardiogram was obtained which showed his EF was reduced at 20 to 25% with akinesis of the mid apical anterior and anterior septal segments suggestive of left bundle branch block. Given the decline in his EF from prior imaging, an outpatient R/LHC was recommended if the patient was in agreement once he had recovered from his PNA.  Was continued on Coreg 25 mg twice daily, Lasix 20 mg as needed, Losartan 100 mg daily and Xarelto at the time of discharge.  In talking with the patient today, he reports his dyspnea has improved since hospital discharge but is not yet back to baseline. He is on 2 L nasal cannula during today's visit and says he had actually been using oxygen supplementation as needed prior to his hospitalization. Reports having dyspnea on exertion but denies any recent orthopnea, PND, or lower extremity edema.  Reports that two nights ago he did develop chest discomfort which occurred when walking around his home. He sat down and symptoms resolved within several minutes. He did not have sublingual  nitroglycerin available.  He requests refills of his insulin as he has not yet established with a PCP here in [place] (from [place] previously).   Past Medical History:  Diagnosis Date  . Asthma   . Cancer ([place])   . CHF (congestive heart failure) ([place])    a. EF 45-50% by echo in 07/2017 b. EF reduced to 20-25% by repeat echo in 10/2018  . Coronary artery disease    a. cath in 2016 showing mild nonobstructive disease  . Diabetes mellitus without complication ([place])   . Gout   . History of pulmonary embolus (PE) 2016  . Hypertension   . Lung cancer ([place])   . Pneumonia     Past Surgical History:  Procedure Laterality Date  . CERVICAL SPINE SURGERY      Current Medications: Outpatient Medications Prior to Visit  Medication Sig Dispense Refill  . allopurinol (ZYLOPRIM) 300 MG tablet Take 300 mg by mouth daily.    [name] amitriptyline (ELAVIL) 50 MG tablet Take 50 mg by mouth 2 (two) times daily.    . budesonide-formoterol (SYMBICORT) 160-4.5 MCG/ACT inhaler Inhale 2 puffs into the lungs 2 (two) times daily.    . cefdinir (OMNICEF) 300 MG capsule Take 1 capsule (300 mg total) by mouth every 12 (twelve) hours. 9 capsule 0  . famotidine (PEPCID) 40 MG tablet Take 40 mg by mouth at bedtime.    [name] HYDROcodone-acetaminophen (NORCO) 10-325 MG tablet Take 1 tablet by mouth every 6 (six) hours as needed for moderate  pain. 12 tablet 0  . insulin aspart (NOVOLOG FLEXPEN) 100 UNIT/ML FlexPen Inject 25 Units into the skin 3 (three) times daily with meals. Plus personal sliding scale    . ipratropium-albuterol (DUONEB) 0.5-2.5 (3) MG/3ML SOLN Take 3 mLs by nebulization every 4 (four) hours as needed. 180 mL 0  . atorvastatin (LIPITOR) 20 MG tablet Take 20 mg by mouth at bedtime.    . carvedilol (COREG) 25 MG tablet Take 25 mg by mouth 2 (two) times daily.    . furosemide (LASIX) 20 MG tablet Take 20 mg by mouth daily as needed for fluid.    . hydrALAZINE (APRESOLINE) 50 MG tablet Take 50 mg by mouth 2  (two) times daily.    . Insulin Detemir (LEVEMIR FLEXTOUCH) 100 UNIT/ML Pen Inject 45 Units into the skin at bedtime.    [name] losartan (COZAAR) 100 MG tablet Take 100 mg by mouth daily.    . rivaroxaban (XARELTO) 20 MG TABS tablet Take 20 mg by mouth every morning.     No facility-administered medications prior to visit.      Allergies:   Patient has no known allergies.   Social History   Socioeconomic History  . Marital status: Single    Spouse name: Not on file  . Number of children: Not on file  . Years of education: Not on file  . Highest education level: Not on file  Occupational History  . Not on file  Social Needs  . Financial resource strain: Not on file  . Food insecurity:    Worry: Not on file    Inability: Not on file  . Transportation needs:    Medical: Not on file    Non-medical: Not on file  Tobacco Use  . Smoking status: Former Smoker    Last attempt to quit: 09/05/2003    Years since quitting: 15.1  . Smokeless tobacco: Never Used  Substance and Sexual Activity  . Alcohol use: Not Currently  . Drug use: Not Currently  . Sexual activity: Not on file  Lifestyle  . Physical activity:    Days per week: Not on file    Minutes per session: Not on file  . Stress: Not on file  Relationships  . Social connections:    Talks on phone: Not on file    Gets together: Not on file    Attends religious service: Not on file    Active member of club or organization: Not on file    Attends meetings of clubs or organizations: Not on file    Relationship status: Not on file  Other Topics Concern  . Not on file  Social History Narrative  . Not on file     Family History:  The patient's family history includes CAD in his brother.   Review of Systems:   Please see the history of present illness.     General:  No chills, fever, night sweats or weight changes.  Cardiovascular:  No edema, orthopnea, palpitations, paroxysmal nocturnal dyspnea. Positive for chest pain and  dyspnea on exertion.  Dermatological: No rash, lesions/masses Respiratory: No cough, dyspnea Urologic: No hematuria, dysuria Abdominal:   No nausea, vomiting, diarrhea, bright red blood per rectum, melena, or hematemesis Neurologic:  No visual changes, wkns, changes in mental status. All other systems reviewed and are otherwise negative except as noted above.   Physical Exam:    VS:  BP 140/84 (BP Location: Left Arm)   Pulse 88   Ht 6' (1.829  m)   Wt 234 lb (106.1 kg)   SpO2 92%   BMI 31.74 kg/m    General: Well developed, well nourished [place] American male appearing in no acute distress. Head: Normocephalic, atraumatic, sclera non-icteric, no xanthomas, nares are without discharge.  Neck: No carotid bruits. JVD not elevated.  Lungs: Respirations regular and unlabored, without wheezes or rales. On 2L [place].  Heart: Regular rate and rhythm. No S3 or S4.  No murmur, no rubs, or gallops appreciated. Abdomen: Soft, non-tender, non-distended with normoactive bowel sounds. No hepatomegaly. No rebound/guarding. No obvious abdominal masses. Msk:  Strength and tone appear normal for age. No joint deformities or effusions. Extremities: No clubbing or cyanosis. Trace edema along RLE, 1+ edema along LLE.  Distal pedal pulses are 2+ bilaterally. Neuro: Alert and oriented X 3. Moves all extremities spontaneously. No focal deficits noted. Psych:  Responds to questions appropriately with a normal affect. Skin: No rashes or lesions noted  Wt Readings from Last 3 Encounters:  11/03/18 234 lb (106.1 kg)  10/13/18 225 lb 1.4 oz (102.1 kg)     Studies/Labs Reviewed:   EKG:  EKG is not ordered today.   Recent Labs: 10/11/2018: B Natriuretic Peptide 499.0 10/12/2018: ALT 13; Magnesium 2.0 10/14/2018: BUN 17; Creatinine, Ser 1.07; Hemoglobin 14.3; Platelets 199; Potassium 3.8; Sodium 138   Lipid Panel No results found for: CHOL, TRIG, HDL, CHOLHDL, VLDL, LDLCALC, LDLDIRECT  Additional studies/  records that were reviewed today include:   Cardiac Catheterization: 2016 EF 0.50, +1-+2 MR, Lt main- mild irreg, LAD - no significant dz, LCx- prox irreg, Ramus small vessel ostial 30%, OM1 B vessel ostial 30%, mid 30%, OM2 small vessel ostial 40%, OM3 small vessel,RCA - dominant; no significant dz; PDA C vessel 12-27-2014   Echocardiogram: 10/12/2018 IMPRESSIONS    1. The left ventricle has severely reduced systolic function of 61-44%. The cavity size was normal. There is mild concentric left ventricular hypertrophy. Indeterminate diastolic function.  2. There is akinesis of the mid-apical anterior and anteroseptal left ventricular segments. Overall septal motion suggests left bundle branch block.  3. The aortic valve is tricuspid There is mild aortic annular calcification noted.  4. The mitral valve is normal in structure. There is mild calcification.  5. The tricuspid valve is normal in structure.  6. The aortic root is normal in size and structure.  7. Right atrial size was mildly dilated.  8. The right ventricle has moderately reduced systolic function. The cavity was normal. There is no increase in right ventricular wall thickness. Right ventricular systolic pressure could not be assessed.   Assessment:    1. Chronic combined systolic (congestive) and diastolic (congestive) heart failure (HCC)   2. Cardiomyopathy, unspecified type ([place])   3. Coronary artery disease involving native coronary artery of native heart with angina pectoris ([place])   4. History of pulmonary embolus (PE)   5. Chronic obstructive pulmonary disease, unspecified COPD type ([place])   6. Essential hypertension   7. Hyperlipidemia LDL goal <70   8. Type 2 diabetes mellitus with other specified complication, with long-term current use of insulin ([place])      Plan:   In order of problems listed above:  1. Chronic Combined Systolic and Diastolic CHF/ New Cardiomyopathy - the patient's EF was previously 45-50%  by echo in 07/2017 and found to be reduced to 20 to 25% during his recent admission. He has baseline dyspnea on exertion in the setting of COPD but denies any recent changes in  this. Does utilize PRN Lasix a few times per week.  - the patient was hesitant about a cardiac catheterization when discussed during his admission but now wishes to proceed. Was previously discussed with Dr. Harl Bowie during his admission. The patient understands that risks include but are not limited to stroke (1 in 1000), death (1 in 67), kidney failure [usually temporary] (1 in 500), bleeding (1 in 200), allergic reaction [possibly serious] (1 in 200).  Will hold Xarelto 48 hours prior to his procedure.  - remains on Coreg 25 mg twice daily and Losartan 100 mg daily. Pending cath results, would consider transitioning Losartan to Entresto (creatinine stable at 1.07 by recent labs on 10/14/2018). Following optimal medical therapy for 3 months, he would need a repeat echocardiogram for reassessment.     2. CAD - prior cardiac catheterization in 2016 showed mild nonobstructive disease as outlined above. He does have baseline dyspnea on exertion and reports intermittent episodes of chest pain over the past few weeks.  - will plan for [facility] as outlined above.  - continue BB and statin therapy. Not on ASA given the need for anticoagulation. Will update his Rx for SL NTG.   3. History of PE - he reports a history of a PE 3+ years ago and had been continued on anticoagulation by his former PCP in [place]. Denies any evidence of active bleeding. Remains on Xarelto for anticoagulation. Will hold for 48 hours prior to his upcoming cath.   4. COPD - he remains on 2L [place] at baseline. Saturations appropriate at 92% during today's visit. Uses Symbicort daily and Albuterol as needed.   5. HTN - BP is at 140/84 during today's visit. Continue Coreg 25mg  BID, Hydralazine 50mg  BID, and Losartan 100mg  daily.   6. HLD - no recent lipids on file.  Will need a repeat FLP and LFT's once he has been on statin therapy consistently for 6 weeks (patient unsure if he was taking regularly prior to his recent admission). Continue Atorvastatin 20mg  daily.   7. Type 2 DM - Hgb A1c 7.4 during admission. He requests refills of his Levemir as he is almost out and has not yet established with a PCP here in [place]. A 30-day supply was provided and he was informed further refills will need to come from his PCP. He has a list of local providers and plans to call later this week to establish care.     Medication Adjustments/Labs and Tests Ordered: Current medicines are reviewed at length with the patient today.  Concerns regarding medicines are outlined above.  Medication changes, Labs and Tests ordered today are listed in the Patient Instructions below. Patient Instructions  Medication Instructions:  Your physician recommends that you continue on your current medications as directed. Please refer to the Current Medication list given to you today.  If you need a refill on your cardiac medications before your next appointment, please call your pharmacy.   Lab work: NONE  If you have labs (blood work) drawn today and your tests are completely normal, you will receive your results only by: [name] MyChart Message (if you have MyChart) OR . A paper copy in the mail If you have any lab test that is abnormal or we need to change your treatment, we will call you to review the results.  Testing/Procedures: Your physician has requested that you have a cardiac catheterization. Cardiac catheterization is used to diagnose and/or treat various heart conditions. Doctors may recommend this procedure for a number  of different reasons. The most common reason is to evaluate chest pain. Chest pain can be a symptom of coronary artery disease (CAD), and cardiac catheterization can show whether plaque is narrowing or blocking your heart's arteries. This procedure is also used to  evaluate the valves, as well as measure the blood flow and oxygen levels in different parts of your heart. For further information please visit [email or web address]. Please follow instruction sheet, as given.   Follow-Up: At [facility], you and your health needs are our priority.  As part of our continuing mission to provide you with exceptional heart care, we have created designated Provider Care Teams.  These Care Teams include your primary Cardiologist (physician) and Advanced Practice Providers (APPs -  Physician Assistants and Nurse Practitioners) who all work together to provide you with the care you need, when you need it. You will need a follow up appointment in 4 weeks.  Please call our office 2 months in advance to schedule this appointment.  You may see No primary care provider on file. or one of the following Advanced Practice Providers on your designated Care Team:   [place], PA-C [facility]) . Ermalinda Barrios, PA-C ([place])  Any Other Special Instructions Will Be Listed Below (If Applicable). Thank you for choosing [facility]!   Signed, Erma Heritage, PA-C  11/03/2018 7:30 PM    [place] [address] [place], [place] [address] Phone: [phone] Fax: [phone]

## 2018-11-02 NOTE — H&P (View-Only) (Signed)
Cardiology Office Note    Date:  11/03/2018   ID:  Roger Lowe., DOB July 12, 1946, MRN [id number]  PCP:  Patient, No Pcp Per  Cardiologist: Carlyle Dolly, MD    Chief Complaint  Patient presents with  . Hospitalization Follow-up    History of Present Illness:    Roger Lowe. is a 73 y.o. male with past medical history of CAD (cath in 2016 showing mild nonobstructive disease), chronic combined systolic and diastolic CHF (EF 79-89% by echo in 07/2017), COPD, HTN, HLD, Type 2 DM, and history of PE (on Xarelto) who presents to the office today for hospital follow-up.   He was most recently admitted to [facility] from 2/10 - 10/14/2018 for worsening dyspnea with an associated productive cough, found to have sepsis secondary to lobar PNA. His respiratory status improved with the use of antibiotics and scheduled nebulizer treatments. Troponin values were flat at 0.06 during admission and echocardiogram was obtained which showed his EF was reduced at 20 to 25% with akinesis of the mid apical anterior and anterior septal segments suggestive of left bundle branch block. Given the decline in his EF from prior imaging, an outpatient R/LHC was recommended if the patient was in agreement once he had recovered from his PNA.  Was continued on Coreg 25 mg twice daily, Lasix 20 mg as needed, Losartan 100 mg daily and Xarelto at the time of discharge.  In talking with the patient today, he reports his dyspnea has improved since hospital discharge but is not yet back to baseline. He is on 2 L nasal cannula during today's visit and says he had actually been using oxygen supplementation as needed prior to his hospitalization. Reports having dyspnea on exertion but denies any recent orthopnea, PND, or lower extremity edema.  Reports that two nights ago he did develop chest discomfort which occurred when walking around his home. He sat down and symptoms resolved within several minutes. He did not have sublingual  nitroglycerin available.  He requests refills of his insulin as he has not yet established with a PCP here in [place] (from [place] previously).   Past Medical History:  Diagnosis Date  . Asthma   . Cancer ([place])   . CHF (congestive heart failure) ([place])    a. EF 45-50% by echo in 07/2017 b. EF reduced to 20-25% by repeat echo in 10/2018  . Coronary artery disease    a. cath in 2016 showing mild nonobstructive disease  . Diabetes mellitus without complication ([place])   . Gout   . History of pulmonary embolus (PE) 2016  . Hypertension   . Lung cancer ([place])   . Pneumonia     Past Surgical History:  Procedure Laterality Date  . CERVICAL SPINE SURGERY      Current Medications: Outpatient Medications Prior to Visit  Medication Sig Dispense Refill  . allopurinol (ZYLOPRIM) 300 MG tablet Take 300 mg by mouth daily.    [name] amitriptyline (ELAVIL) 50 MG tablet Take 50 mg by mouth 2 (two) times daily.    . budesonide-formoterol (SYMBICORT) 160-4.5 MCG/ACT inhaler Inhale 2 puffs into the lungs 2 (two) times daily.    . cefdinir (OMNICEF) 300 MG capsule Take 1 capsule (300 mg total) by mouth every 12 (twelve) hours. 9 capsule 0  . famotidine (PEPCID) 40 MG tablet Take 40 mg by mouth at bedtime.    [name] HYDROcodone-acetaminophen (NORCO) 10-325 MG tablet Take 1 tablet by mouth every 6 (six) hours as needed for moderate  pain. 12 tablet 0  . insulin aspart (NOVOLOG FLEXPEN) 100 UNIT/ML FlexPen Inject 25 Units into the skin 3 (three) times daily with meals. Plus personal sliding scale    . ipratropium-albuterol (DUONEB) 0.5-2.5 (3) MG/3ML SOLN Take 3 mLs by nebulization every 4 (four) hours as needed. 180 mL 0  . atorvastatin (LIPITOR) 20 MG tablet Take 20 mg by mouth at bedtime.    . carvedilol (COREG) 25 MG tablet Take 25 mg by mouth 2 (two) times daily.    . furosemide (LASIX) 20 MG tablet Take 20 mg by mouth daily as needed for fluid.    . hydrALAZINE (APRESOLINE) 50 MG tablet Take 50 mg by mouth 2  (two) times daily.    . Insulin Detemir (LEVEMIR FLEXTOUCH) 100 UNIT/ML Pen Inject 45 Units into the skin at bedtime.    [name] losartan (COZAAR) 100 MG tablet Take 100 mg by mouth daily.    . rivaroxaban (XARELTO) 20 MG TABS tablet Take 20 mg by mouth every morning.     No facility-administered medications prior to visit.      Allergies:   Patient has no known allergies.   Social History   Socioeconomic History  . Marital status: Single    Spouse name: Not on file  . Number of children: Not on file  . Years of education: Not on file  . Highest education level: Not on file  Occupational History  . Not on file  Social Needs  . Financial resource strain: Not on file  . Food insecurity:    Worry: Not on file    Inability: Not on file  . Transportation needs:    Medical: Not on file    Non-medical: Not on file  Tobacco Use  . Smoking status: Former Smoker    Last attempt to quit: 09/05/2003    Years since quitting: 15.1  . Smokeless tobacco: Never Used  Substance and Sexual Activity  . Alcohol use: Not Currently  . Drug use: Not Currently  . Sexual activity: Not on file  Lifestyle  . Physical activity:    Days per week: Not on file    Minutes per session: Not on file  . Stress: Not on file  Relationships  . Social connections:    Talks on phone: Not on file    Gets together: Not on file    Attends religious service: Not on file    Active member of club or organization: Not on file    Attends meetings of clubs or organizations: Not on file    Relationship status: Not on file  Other Topics Concern  . Not on file  Social History Narrative  . Not on file     Family History:  The patient's family history includes CAD in his brother.   Review of Systems:   Please see the history of present illness.     General:  No chills, fever, night sweats or weight changes.  Cardiovascular:  No edema, orthopnea, palpitations, paroxysmal nocturnal dyspnea. Positive for chest pain and  dyspnea on exertion.  Dermatological: No rash, lesions/masses Respiratory: No cough, dyspnea Urologic: No hematuria, dysuria Abdominal:   No nausea, vomiting, diarrhea, bright red blood per rectum, melena, or hematemesis Neurologic:  No visual changes, wkns, changes in mental status. All other systems reviewed and are otherwise negative except as noted above.   Physical Exam:    VS:  BP 140/84 (BP Location: Left Arm)   Pulse 88   Ht 6' (1.829  m)   Wt 234 lb (106.1 kg)   SpO2 92%   BMI 31.74 kg/m    General: Well developed, well nourished [place] American male appearing in no acute distress. Head: Normocephalic, atraumatic, sclera non-icteric, no xanthomas, nares are without discharge.  Neck: No carotid bruits. JVD not elevated.  Lungs: Respirations regular and unlabored, without wheezes or rales. On 2L [place].  Heart: Regular rate and rhythm. No S3 or S4.  No murmur, no rubs, or gallops appreciated. Abdomen: Soft, non-tender, non-distended with normoactive bowel sounds. No hepatomegaly. No rebound/guarding. No obvious abdominal masses. Msk:  Strength and tone appear normal for age. No joint deformities or effusions. Extremities: No clubbing or cyanosis. Trace edema along RLE, 1+ edema along LLE.  Distal pedal pulses are 2+ bilaterally. Neuro: Alert and oriented X 3. Moves all extremities spontaneously. No focal deficits noted. Psych:  Responds to questions appropriately with a normal affect. Skin: No rashes or lesions noted  Wt Readings from Last 3 Encounters:  11/03/18 234 lb (106.1 kg)  10/13/18 225 lb 1.4 oz (102.1 kg)     Studies/Labs Reviewed:   EKG:  EKG is not ordered today.   Recent Labs: 10/11/2018: B Natriuretic Peptide 499.0 10/12/2018: ALT 13; Magnesium 2.0 10/14/2018: BUN 17; Creatinine, Ser 1.07; Hemoglobin 14.3; Platelets 199; Potassium 3.8; Sodium 138   Lipid Panel No results found for: CHOL, TRIG, HDL, CHOLHDL, VLDL, LDLCALC, LDLDIRECT  Additional studies/  records that were reviewed today include:   Cardiac Catheterization: 2016 EF 0.50, +1-+2 MR, Lt main- mild irreg, LAD - no significant dz, LCx- prox irreg, Ramus small vessel ostial 30%, OM1 B vessel ostial 30%, mid 30%, OM2 small vessel ostial 40%, OM3 small vessel,RCA - dominant; no significant dz; PDA C vessel 12-27-2014   Echocardiogram: 10/12/2018 IMPRESSIONS    1. The left ventricle has severely reduced systolic function of 16-60%. The cavity size was normal. There is mild concentric left ventricular hypertrophy. Indeterminate diastolic function.  2. There is akinesis of the mid-apical anterior and anteroseptal left ventricular segments. Overall septal motion suggests left bundle branch block.  3. The aortic valve is tricuspid There is mild aortic annular calcification noted.  4. The mitral valve is normal in structure. There is mild calcification.  5. The tricuspid valve is normal in structure.  6. The aortic root is normal in size and structure.  7. Right atrial size was mildly dilated.  8. The right ventricle has moderately reduced systolic function. The cavity was normal. There is no increase in right ventricular wall thickness. Right ventricular systolic pressure could not be assessed.   Assessment:    1. Chronic combined systolic (congestive) and diastolic (congestive) heart failure (HCC)   2. Cardiomyopathy, unspecified type ([place])   3. Coronary artery disease involving native coronary artery of native heart with angina pectoris ([place])   4. History of pulmonary embolus (PE)   5. Chronic obstructive pulmonary disease, unspecified COPD type ([place])   6. Essential hypertension   7. Hyperlipidemia LDL goal <70   8. Type 2 diabetes mellitus with other specified complication, with long-term current use of insulin ([place])      Plan:   In order of problems listed above:  1. Chronic Combined Systolic and Diastolic CHF/ New Cardiomyopathy - the patient's EF was previously 45-50%  by echo in 07/2017 and found to be reduced to 20 to 25% during his recent admission. He has baseline dyspnea on exertion in the setting of COPD but denies any recent changes in  this. Does utilize PRN Lasix a few times per week.  - the patient was hesitant about a cardiac catheterization when discussed during his admission but now wishes to proceed. Was previously discussed with Dr. Harl Bowie during his admission. The patient understands that risks include but are not limited to stroke (1 in 1000), death (1 in 103), kidney failure [usually temporary] (1 in 500), bleeding (1 in 200), allergic reaction [possibly serious] (1 in 200).  Will hold Xarelto 48 hours prior to his procedure.  - remains on Coreg 25 mg twice daily and Losartan 100 mg daily. Pending cath results, would consider transitioning Losartan to Entresto (creatinine stable at 1.07 by recent labs on 10/14/2018). Following optimal medical therapy for 3 months, he would need a repeat echocardiogram for reassessment.     2. CAD - prior cardiac catheterization in 2016 showed mild nonobstructive disease as outlined above. He does have baseline dyspnea on exertion and reports intermittent episodes of chest pain over the past few weeks.  - will plan for [facility] as outlined above.  - continue BB and statin therapy. Not on ASA given the need for anticoagulation. Will update his Rx for SL NTG.   3. History of PE - he reports a history of a PE 3+ years ago and had been continued on anticoagulation by his former PCP in [place]. Denies any evidence of active bleeding. Remains on Xarelto for anticoagulation. Will hold for 48 hours prior to his upcoming cath.   4. COPD - he remains on 2L [place] at baseline. Saturations appropriate at 92% during today's visit. Uses Symbicort daily and Albuterol as needed.   5. HTN - BP is at 140/84 during today's visit. Continue Coreg 25mg  BID, Hydralazine 50mg  BID, and Losartan 100mg  daily.   6. HLD - no recent lipids on file.  Will need a repeat FLP and LFT's once he has been on statin therapy consistently for 6 weeks (patient unsure if he was taking regularly prior to his recent admission). Continue Atorvastatin 20mg  daily.   7. Type 2 DM - Hgb A1c 7.4 during admission. He requests refills of his Levemir as he is almost out and has not yet established with a PCP here in [place]. A 30-day supply was provided and he was informed further refills will need to come from his PCP. He has a list of local providers and plans to call later this week to establish care.     Medication Adjustments/Labs and Tests Ordered: Current medicines are reviewed at length with the patient today.  Concerns regarding medicines are outlined above.  Medication changes, Labs and Tests ordered today are listed in the Patient Instructions below. Patient Instructions  Medication Instructions:  Your physician recommends that you continue on your current medications as directed. Please refer to the Current Medication list given to you today.  If you need a refill on your cardiac medications before your next appointment, please call your pharmacy.   Lab work: NONE  If you have labs (blood work) drawn today and your tests are completely normal, you will receive your results only by: [name] MyChart Message (if you have MyChart) OR . A paper copy in the mail If you have any lab test that is abnormal or we need to change your treatment, we will call you to review the results.  Testing/Procedures: Your physician has requested that you have a cardiac catheterization. Cardiac catheterization is used to diagnose and/or treat various heart conditions. Doctors may recommend this procedure for a number  of different reasons. The most common reason is to evaluate chest pain. Chest pain can be a symptom of coronary artery disease (CAD), and cardiac catheterization can show whether plaque is narrowing or blocking your heart's arteries. This procedure is also used to  evaluate the valves, as well as measure the blood flow and oxygen levels in different parts of your heart. For further information please visit HugeFiesta.tn. Please follow instruction sheet, as given.   Follow-Up: At [facility], you and your health needs are our priority.  As part of our continuing mission to provide you with exceptional heart care, we have created designated Provider Care Teams.  These Care Teams include your primary Cardiologist (physician) and Advanced Practice Providers (APPs -  Physician Assistants and Nurse Practitioners) who all work together to provide you with the care you need, when you need it. You will need a follow up appointment in 4 weeks.  Please call our office 2 months in advance to schedule this appointment.  You may see No primary care provider on file. or one of the following Advanced Practice Providers on your designated Care Team:   [place], PA-C [facility]) . Ermalinda Barrios, PA-C ([place])  Any Other Special Instructions Will Be Listed Below (If Applicable). Thank you for choosing [facility]!   Signed, Roger Heritage, PA-C  11/03/2018 7:30 PM    [place] [address] [place], [place] [address] Phone: [phone] Fax: [phone]

## 2018-11-03 ENCOUNTER — Ambulatory Visit (INDEPENDENT_AMBULATORY_CARE_PROVIDER_SITE_OTHER): Payer: Medicare HMO | Admitting: Student

## 2018-11-03 ENCOUNTER — Encounter: Payer: Self-pay | Admitting: Student

## 2018-11-03 VITALS — BP 140/84 | HR 88 | Ht 72.0 in | Wt 234.0 lb

## 2018-11-03 DIAGNOSIS — I5042 Chronic combined systolic (congestive) and diastolic (congestive) heart failure: Secondary | ICD-10-CM | POA: Diagnosis not present

## 2018-11-03 DIAGNOSIS — Z794 Long term (current) use of insulin: Secondary | ICD-10-CM

## 2018-11-03 DIAGNOSIS — Z86711 Personal history of pulmonary embolism: Secondary | ICD-10-CM

## 2018-11-03 DIAGNOSIS — I25119 Atherosclerotic heart disease of native coronary artery with unspecified angina pectoris: Secondary | ICD-10-CM | POA: Diagnosis not present

## 2018-11-03 DIAGNOSIS — I10 Essential (primary) hypertension: Secondary | ICD-10-CM

## 2018-11-03 DIAGNOSIS — E1169 Type 2 diabetes mellitus with other specified complication: Secondary | ICD-10-CM

## 2018-11-03 DIAGNOSIS — E785 Hyperlipidemia, unspecified: Secondary | ICD-10-CM

## 2018-11-03 DIAGNOSIS — I429 Cardiomyopathy, unspecified: Secondary | ICD-10-CM | POA: Diagnosis not present

## 2018-11-03 DIAGNOSIS — J449 Chronic obstructive pulmonary disease, unspecified: Secondary | ICD-10-CM

## 2018-11-03 MED ORDER — RIVAROXABAN 20 MG PO TABS: 20.0000 mg | ORAL_TABLET | Freq: Every morning | ORAL | 3 refills | Status: DC

## 2018-11-03 MED ORDER — ATORVASTATIN CALCIUM 20 MG PO TABS: 20.0000 mg | ORAL_TABLET | Freq: Every day | ORAL | 3 refills | Status: DC

## 2018-11-03 MED ORDER — HYDRALAZINE HCL 50 MG PO TABS: 50.0000 mg | ORAL_TABLET | Freq: Two times a day (BID) | ORAL | 3 refills | Status: DC

## 2018-11-03 MED ORDER — CARVEDILOL 25 MG PO TABS: 25.0000 mg | ORAL_TABLET | Freq: Two times a day (BID) | ORAL | 3 refills | Status: DC

## 2018-11-03 MED ORDER — FUROSEMIDE 20 MG PO TABS: 20.0000 mg | ORAL_TABLET | Freq: Every day | ORAL | 3 refills | Status: DC | PRN

## 2018-11-03 MED ORDER — NITROGLYCERIN 0.4 MG SL SUBL: 0.4000 mg | SUBLINGUAL_TABLET | SUBLINGUAL | 2 refills | Status: DC | PRN

## 2018-11-03 MED ORDER — LOSARTAN POTASSIUM 100 MG PO TABS: 100.0000 mg | ORAL_TABLET | Freq: Every day | ORAL | 3 refills | Status: DC

## 2018-11-03 MED ORDER — INSULIN DETEMIR 100 UNIT/ML FLEXPEN: 45.0000 [IU] | PEN_INJECTOR | Freq: Every day | SUBCUTANEOUS | 0 refills | Status: DC

## 2018-11-03 NOTE — Patient Instructions (Addendum)
Medication Instructions:  Your physician recommends that you continue on your current medications as directed. Please refer to the Current Medication list given to you today.  If you need a refill on your cardiac medications before your next appointment, please call your pharmacy.   Lab work: NONE  If you have labs (blood work) drawn today and your tests are completely normal, you will receive your results only by: [name] MyChart Message (if you have MyChart) OR . A paper copy in the mail If you have any lab test that is abnormal or we need to change your treatment, we will call you to review the results.  Testing/Procedures: Your physician has requested that you have a cardiac catheterization. Cardiac catheterization is used to diagnose and/or treat various heart conditions. Doctors may recommend this procedure for a number of different reasons. The most common reason is to evaluate chest pain. Chest pain can be a symptom of coronary artery disease (CAD), and cardiac catheterization can show whether plaque is narrowing or blocking your heart's arteries. This procedure is also used to evaluate the valves, as well as measure the blood flow and oxygen levels in different parts of your heart. For further information please visit HugeFiesta.tn. Please follow instruction sheet, as given.    Follow-Up: At [facility], you and your health needs are our priority.  As part of our continuing mission to provide you with exceptional heart care, we have created designated Provider Care Teams.  These Care Teams include your primary Cardiologist (physician) and Advanced Practice Providers (APPs -  Physician Assistants and Nurse Practitioners) who all work together to provide you with the care you need, when you need it. You will need a follow up appointment in 4 weeks.  Please call our office 2 months in advance to schedule this appointment.  You may see No primary care provider on file. or one of the  following Advanced Practice Providers on your designated Care Team:   [place], PA-C [facility]) . Ermalinda Barrios, PA-C ([place])  Any Other Special Instructions Will Be Listed Below (If Applicable). Thank you for choosing [facility]!     [address] [place] [address] Dept: [phone] Loc: [place].  11/03/2018  You are scheduled for a Cardiac Catheterization on Monday, March 9 with Dr. Larae Grooms.  1. Please arrive at the [facility] (Main Entrance A) at [facility]: [address] [place],  [address] at 7:00 AM (This time is two hours before your procedure to ensure your preparation). Free valet parking service is available.   Special note: Every effort is made to have your procedure done on time. Please understand that emergencies sometimes delay scheduled procedures.  2. Diet: Do not eat solid foods after midnight.  The patient may have clear liquids until 5am upon the day of the procedure.  3. Labs: You will need to have blood drawn on ,   at . You do not need to be fasting.  4. Medication instructions in preparation for your procedure:   Contrast Allergy: No Do not take Lasix the morning of your procedure.  Stop taking Xarelto (Rivaroxaban) on Saturday, March 7.  Only Take 1/2 Insulin the night before your procedure. Do not take any the morning of your procedure.   On the morning of your procedure, take your Aspirin and any morning medicines NOT listed above.  You may use  sips of water.  5. Plan for one night stay--bring personal belongings. 6. Bring a current list of your medications and current insurance cards. 7. You MUST have a responsible person to drive you home. 8. Someone MUST be with you the first 24 hours after you arrive home or your discharge will be delayed. 9. Please wear clothes that are  easy to get on and off and wear slip-on shoes.  Thank you for allowing [place] to care for you!   -- [facility]

## 2018-11-04 ENCOUNTER — Telehealth: Payer: Self-pay | Admitting: *Deleted

## 2018-11-04 NOTE — Telephone Encounter (Signed)
Unable to reach patient, voice mail.

## 2018-11-04 NOTE — Telephone Encounter (Signed)
Pt contacted pre-catheterization scheduled at [facility] for: Monday November 08, 2018 9 AM Verified arrival time and place: [place] Entrance A at: 7 AM  No solid food after midnight prior to cath, clear liquids until 5 AM day of procedure. Contrast allergy: no  Hold: Xarelto-11/06/18 until post procedure. Insulin-AM of procedure. 1/2 Insulin-PM Furosemide-AM of procedure.  Except hold medications AM meds can be  taken pre-cath with sip of water including: ASA 81 mg  Confirm patient has responsible person to drive home post procedure and observe 24 hours after arriving home.  LMTCB to review instructions with patient.

## 2018-11-08 ENCOUNTER — Ambulatory Visit (HOSPITAL_COMMUNITY)
Admission: RE | Admit: 2018-11-08 | Discharge: 2018-11-08 | Disposition: A | Discharge status: Home / Self Care | Payer: Medicare HMO | Attending: Interventional Cardiology | Admitting: Interventional Cardiology

## 2018-11-08 ENCOUNTER — Other Ambulatory Visit: Payer: Self-pay

## 2018-11-08 ENCOUNTER — Encounter (HOSPITAL_COMMUNITY)
Admission: RE | Disposition: A | Discharge status: Home / Self Care | Payer: Self-pay | Source: Home / Self Care | Attending: Interventional Cardiology

## 2018-11-08 ENCOUNTER — Encounter (HOSPITAL_COMMUNITY): Payer: Self-pay | Admitting: Interventional Cardiology

## 2018-11-08 DIAGNOSIS — I110 Hypertensive heart disease with heart failure: Secondary | ICD-10-CM | POA: Diagnosis present

## 2018-11-08 DIAGNOSIS — Z85118 Personal history of other malignant neoplasm of bronchus and lung: Secondary | ICD-10-CM | POA: Diagnosis not present

## 2018-11-08 DIAGNOSIS — J449 Chronic obstructive pulmonary disease, unspecified: Secondary | ICD-10-CM | POA: Insufficient documentation

## 2018-11-08 DIAGNOSIS — I5043 Acute on chronic combined systolic (congestive) and diastolic (congestive) heart failure: Secondary | ICD-10-CM | POA: Insufficient documentation

## 2018-11-08 DIAGNOSIS — Z86711 Personal history of pulmonary embolism: Secondary | ICD-10-CM | POA: Diagnosis not present

## 2018-11-08 DIAGNOSIS — I5021 Acute systolic (congestive) heart failure: Secondary | ICD-10-CM

## 2018-11-08 DIAGNOSIS — Z79899 Other long term (current) drug therapy: Secondary | ICD-10-CM | POA: Insufficient documentation

## 2018-11-08 DIAGNOSIS — M109 Gout, unspecified: Secondary | ICD-10-CM | POA: Insufficient documentation

## 2018-11-08 DIAGNOSIS — I429 Cardiomyopathy, unspecified: Secondary | ICD-10-CM | POA: Diagnosis not present

## 2018-11-08 DIAGNOSIS — E1169 Type 2 diabetes mellitus with other specified complication: Secondary | ICD-10-CM | POA: Insufficient documentation

## 2018-11-08 DIAGNOSIS — E785 Hyperlipidemia, unspecified: Secondary | ICD-10-CM | POA: Diagnosis not present

## 2018-11-08 DIAGNOSIS — Z87891 Personal history of nicotine dependence: Secondary | ICD-10-CM | POA: Diagnosis not present

## 2018-11-08 DIAGNOSIS — Z794 Long term (current) use of insulin: Secondary | ICD-10-CM | POA: Insufficient documentation

## 2018-11-08 DIAGNOSIS — Z7901 Long term (current) use of anticoagulants: Secondary | ICD-10-CM | POA: Insufficient documentation

## 2018-11-08 DIAGNOSIS — I25119 Atherosclerotic heart disease of native coronary artery with unspecified angina pectoris: Secondary | ICD-10-CM | POA: Insufficient documentation

## 2018-11-08 HISTORY — PX: RIGHT/LEFT HEART CATH AND CORONARY ANGIOGRAPHY: CATH118266

## 2018-11-08 LAB — POCT I-STAT 7, (LYTES, BLD GAS, ICA,H+H)
Bicarbonate: 24.8 mmol/L (ref 20.0–28.0)
Calcium, Ion: 1.22 mmol/L (ref 1.15–1.40)
HCT: 42 % (ref 39.0–52.0)
Hemoglobin: 14.3 g/dL (ref 13.0–17.0)
O2 Saturation: 98 %
PCO2 ART: 41.8 mmHg (ref 32.0–48.0)
Potassium: 3.8 mmol/L (ref 3.5–5.1)
Sodium: 142 mmol/L (ref 135–145)
TCO2: 26 mmol/L (ref 22–32)
pH, Arterial: 7.38 (ref 7.350–7.450)
pO2, Arterial: 100 mmHg (ref 83.0–108.0)

## 2018-11-08 LAB — POCT I-STAT EG7
ACID-BASE EXCESS: 1 mmol/L (ref 0.0–2.0)
Acid-Base Excess: 1 mmol/L (ref 0.0–2.0)
Bicarbonate: 27.1 mmol/L (ref 20.0–28.0)
Bicarbonate: 27.1 mmol/L (ref 20.0–28.0)
CALCIUM ION: 1.28 mmol/L (ref 1.15–1.40)
Calcium, Ion: 1.29 mmol/L (ref 1.15–1.40)
HCT: 43 % (ref 39.0–52.0)
HCT: 44 % (ref 39.0–52.0)
HEMOGLOBIN: 14.6 g/dL (ref 13.0–17.0)
Hemoglobin: 15 g/dL (ref 13.0–17.0)
O2 Saturation: 72 %
O2 Saturation: 73 %
Potassium: 3.9 mmol/L (ref 3.5–5.1)
Potassium: 3.9 mmol/L (ref 3.5–5.1)
Sodium: 141 mmol/L (ref 135–145)
Sodium: 142 mmol/L (ref 135–145)
TCO2: 29 mmol/L (ref 22–32)
TCO2: 29 mmol/L (ref 22–32)
pCO2, Ven: 48.1 mmHg (ref 44.0–60.0)
pCO2, Ven: 48.2 mmHg (ref 44.0–60.0)
pH, Ven: 7.357 (ref 7.250–7.430)
pH, Ven: 7.358 (ref 7.250–7.430)
pO2, Ven: 40 mmHg (ref 32.0–45.0)
pO2, Ven: 40 mmHg (ref 32.0–45.0)

## 2018-11-08 LAB — GLUCOSE, CAPILLARY
Glucose-Capillary: 85 mg/dL (ref 70–99)
Glucose-Capillary: 88 mg/dL (ref 70–99)

## 2018-11-08 SURGERY — RIGHT/LEFT HEART CATH AND CORONARY ANGIOGRAPHY
Anesthesia: LOCAL

## 2018-11-08 MED ORDER — SODIUM CHLORIDE 0.9 % IV SOLN
INTRAVENOUS | Status: DC
  Administered 2018-11-08: 09:00:00 via INTRAVENOUS

## 2018-11-08 MED ORDER — SODIUM CHLORIDE 0.9% FLUSH: 3.0000 mL | INTRAVENOUS | Status: DC | PRN

## 2018-11-08 MED ORDER — FENTANYL CITRATE (PF) 100 MCG/2ML IJ SOLN
INTRAMUSCULAR | Status: AC
  Filled 2018-11-08: qty 2

## 2018-11-08 MED ORDER — VERAPAMIL HCL 2.5 MG/ML IV SOLN
INTRAVENOUS | Status: DC | PRN
  Administered 2018-11-08: 10:00:00 via INTRA_ARTERIAL

## 2018-11-08 MED ORDER — VERAPAMIL HCL 2.5 MG/ML IV SOLN
INTRAVENOUS | Status: AC
  Filled 2018-11-08: qty 2

## 2018-11-08 MED ORDER — SODIUM CHLORIDE 0.9 % IV SOLN: 250.0000 mL | INTRAVENOUS | Status: DC | PRN

## 2018-11-08 MED ORDER — SODIUM CHLORIDE 0.9% FLUSH: 3.0000 mL | Freq: Two times a day (BID) | INTRAVENOUS | Status: DC

## 2018-11-08 MED ORDER — FENTANYL CITRATE (PF) 100 MCG/2ML IJ SOLN
INTRAMUSCULAR | Status: DC | PRN
  Administered 2018-11-08: 25 ug via INTRAVENOUS

## 2018-11-08 MED ORDER — RIVAROXABAN 20 MG PO TABS: 20.0000 mg | ORAL_TABLET | Freq: Every morning | ORAL | 3 refills | Status: DC

## 2018-11-08 MED ORDER — LIDOCAINE HCL (PF) 1 % IJ SOLN
INTRAMUSCULAR | Status: DC | PRN
  Administered 2018-11-08 (×2): 2 mL

## 2018-11-08 MED ORDER — ASPIRIN 81 MG PO CHEW: 81.0000 mg | CHEWABLE_TABLET | ORAL | Status: DC

## 2018-11-08 MED ORDER — ACETAMINOPHEN 325 MG PO TABS: 650.0000 mg | ORAL_TABLET | ORAL | Status: DC | PRN

## 2018-11-08 MED ORDER — IOHEXOL 350 MG/ML SOLN
INTRAVENOUS | Status: DC | PRN
  Administered 2018-11-08: 75 mL via INTRACARDIAC

## 2018-11-08 MED ORDER — MIDAZOLAM HCL 2 MG/2ML IJ SOLN
INTRAMUSCULAR | Status: AC
  Filled 2018-11-08: qty 2

## 2018-11-08 MED ORDER — LIDOCAINE HCL (PF) 1 % IJ SOLN
INTRAMUSCULAR | Status: AC
  Filled 2018-11-08: qty 30

## 2018-11-08 MED ORDER — HEPARIN SODIUM (PORCINE) 1000 UNIT/ML IJ SOLN
INTRAMUSCULAR | Status: DC | PRN
  Administered 2018-11-08: 5000 [IU] via INTRAVENOUS

## 2018-11-08 MED ORDER — HEPARIN (PORCINE) IN NACL 1000-0.9 UT/500ML-% IV SOLN
INTRAVENOUS | Status: AC
  Filled 2018-11-08: qty 1000

## 2018-11-08 MED ORDER — HEPARIN SODIUM (PORCINE) 1000 UNIT/ML IJ SOLN
INTRAMUSCULAR | Status: AC
  Filled 2018-11-08: qty 1

## 2018-11-08 MED ORDER — MIDAZOLAM HCL 2 MG/2ML IJ SOLN
INTRAMUSCULAR | Status: DC | PRN
  Administered 2018-11-08: 2 mg via INTRAVENOUS

## 2018-11-08 SURGICAL SUPPLY — 12 items
CATH 5FR JL3.5 JR4 ANG PIG MP (CATHETERS) ×2 IMPLANT
CATH BALLN WEDGE 5F 110CM (CATHETERS) ×2 IMPLANT
DEVICE RAD COMP TR BAND LRG (VASCULAR PRODUCTS) ×2 IMPLANT
GLIDESHEATH SLEND SS 6F .021 (SHEATH) ×2 IMPLANT
GUIDEWIRE INQWIRE 1.5J.035X260 (WIRE) ×1 IMPLANT
INQWIRE 1.5J .035X260CM (WIRE) ×2
KIT HEART LEFT (KITS) ×2 IMPLANT
PACK CARDIAC CATHETERIZATION (CUSTOM PROCEDURE TRAY) ×2 IMPLANT
SHEATH GLIDE SLENDER 4/5FR (SHEATH) ×2 IMPLANT
TRANSDUCER W/STOPCOCK (MISCELLANEOUS) ×2 IMPLANT
TUBING CIL FLEX 10 FLL-RA (TUBING) ×2 IMPLANT
WIRE EMERALD 3MM-J .025X260CM (WIRE) ×2 IMPLANT

## 2018-11-08 NOTE — Discharge Instructions (Signed)
Drink plenty of fluids over next 48 hours and keep right wrist elevated at heart level for 24 hours ° °Radial Site Care ° °This sheet gives you information about how to care for yourself after your procedure. Your health care provider may also give you more specific instructions. If you have problems or questions, contact your health care provider. °What can I expect after the procedure? °After the procedure, it is common to have: °· Bruising and tenderness at the catheter insertion area. °Follow these instructions at home: °Medicines °· Take over-the-counter and prescription medicines only as told by your health care provider. °Insertion site care °· Follow instructions from your health care provider about how to take care of your insertion site. Make sure you: °? Wash your hands with soap and water before you change your bandage (dressing). If soap and water are not available, use hand sanitizer. °? Remove your dressing as told by your health care provider. In 24-48 hours °· Check your insertion site every day for signs of infection. Check for: °? Redness, swelling, or pain. °? Fluid or blood. °? Pus or a bad smell. °? Warmth. °· Do not take baths, swim, or use a hot tub until your health care provider approves. °· You may shower 24-48 hours after the procedure, or as directed by your health care provider. °? Remove the dressing and gently wash the site with plain soap and water. °? Pat the area dry with a clean towel. °? Do not rub the site. That could cause bleeding. °· Do not apply powder or lotion to the site. °Activity ° °· For 24 hours after the procedure, or as directed by your health care provider: °? Do not flex or bend the affected arm. °? Do not push or pull heavy objects with the affected arm. °? Do not drive yourself home from the hospital or clinic. You may drive 24 hours after the procedure unless your health care provider tells you not to. °? Do not operate machinery or power tools. °· Do not lift  anything that is heavier than 10 lb (4.5 kg), or the limit that you are told, until your health care provider says that it is safe. For 5 days °· Ask your health care provider when it is okay to: °? Return to work or school. °? Resume usual physical activities or sports. °? Resume sexual activity. °General instructions °· If the catheter site starts to bleed, raise your arm and put firm pressure on the site. If the bleeding does not stop, get help right away. This is a medical emergency. °· If you went home on the same day as your procedure, a responsible adult should be with you for the first 24 hours after you arrive home. °· Keep all follow-up visits as told by your health care provider. This is important. °Contact a health care provider if: °· You have a fever. °· You have redness, swelling, or yellow drainage around your insertion site. °Get help right away if: °· You have unusual pain at the radial site. °· The catheter insertion area swells very fast. °· The insertion area is bleeding, and the bleeding does not stop when you hold steady pressure on the area. °· Your arm or hand becomes pale, cool, tingly, or numb. °These symptoms may represent a serious problem that is an emergency. Do not wait to see if the symptoms will go away. Get medical help right away. Call your local emergency services (911 in the [place] Do not   drive yourself to the hospital. °Summary °· After the procedure, it is common to have bruising and tenderness at the site. °· Follow instructions from your health care provider about how to take care of your radial site wound. Check the wound every day for signs of infection. °· Do not lift anything that is heavier than 10 lb (4.5 kg), or the limit that you are told, until your health care provider says that it is safe. °This information is not intended to replace advice given to you by your health care provider. Make sure you discuss any questions you have with your health care  provider. °Document Released: 09/20/2010 Document Revised: 09/23/2017 Document Reviewed: 09/23/2017 °Elsevier Interactive Patient Education © 2019 [employer] ° °

## 2018-11-08 NOTE — Interval H&P Note (Signed)
Cath Lab Visit (complete for each Cath Lab visit)  Clinical Evaluation Leading to the Procedure:   ACS: No.  Non-ACS:    Anginal Classification: CCS III  Anti-ischemic medical therapy: Minimal Therapy (1 class of medications)  Non-Invasive Test Results: Intermediate-risk stress test findings: cardiac mortality 1-3%/year low EF by echo  Prior CABG: No previous CABG      History and Physical Interval Note:  11/08/2018 9:34 AM  Roger Lowe.  has presented today for surgery, with the diagnosis of Heart Failure.  The various methods of treatment have been discussed with the patient and family. After consideration of risks, benefits and other options for treatment, the patient has consented to  Procedure(s): RIGHT/LEFT HEART CATH AND CORONARY ANGIOGRAPHY (N/A) as a surgical intervention.  The patient's history has been reviewed, patient examined, no change in status, stable for surgery.  I have reviewed the patient's chart and labs.  Questions were answered to the patient's satisfaction.     Larae Grooms

## 2018-11-26 ENCOUNTER — Encounter: Payer: Self-pay | Admitting: *Deleted

## 2018-12-01 ENCOUNTER — Encounter: Payer: Self-pay | Admitting: Student

## 2018-12-01 ENCOUNTER — Telehealth (INDEPENDENT_AMBULATORY_CARE_PROVIDER_SITE_OTHER): Payer: Medicare HMO | Admitting: Student

## 2018-12-01 ENCOUNTER — Ambulatory Visit: Payer: Medicare HMO | Admitting: Student

## 2018-12-01 VITALS — BP 152/97 | HR 101 | Ht 72.0 in | Wt 230.0 lb

## 2018-12-01 DIAGNOSIS — I428 Other cardiomyopathies: Secondary | ICD-10-CM

## 2018-12-01 DIAGNOSIS — Z79899 Other long term (current) drug therapy: Secondary | ICD-10-CM

## 2018-12-01 DIAGNOSIS — J449 Chronic obstructive pulmonary disease, unspecified: Secondary | ICD-10-CM | POA: Diagnosis not present

## 2018-12-01 DIAGNOSIS — Z86711 Personal history of pulmonary embolism: Secondary | ICD-10-CM

## 2018-12-01 DIAGNOSIS — I5042 Chronic combined systolic (congestive) and diastolic (congestive) heart failure: Secondary | ICD-10-CM | POA: Diagnosis not present

## 2018-12-01 DIAGNOSIS — I10 Essential (primary) hypertension: Secondary | ICD-10-CM | POA: Diagnosis not present

## 2018-12-01 DIAGNOSIS — E785 Hyperlipidemia, unspecified: Secondary | ICD-10-CM | POA: Diagnosis not present

## 2018-12-01 DIAGNOSIS — Z7901 Long term (current) use of anticoagulants: Secondary | ICD-10-CM

## 2018-12-01 NOTE — Patient Instructions (Signed)
Medication Instructions:  Your physician recommends that you continue on your current medications as directed. Please refer to the Current Medication list given to you today.    Labwork: NONE  Testing/Procedures: NONE  Follow-Up: Your physician recommends that you schedule a follow-up appointment in:3 Months with Dr. Harl Bowie    Any Other Special Instructions Will Be Listed Below (If Applicable).     If you need a refill on your cardiac medications before your next appointment, please call your pharmacy.  Thank you for choosing [facility]!

## 2018-12-01 NOTE — Progress Notes (Signed)
Virtual Visit via Video Note    Evaluation Performed:  Follow-up visit  This visit type was conducted due to national recommendations for restrictions regarding the COVID-19 Pandemic (e.g. social distancing).  This format is felt to be most appropriate for this patient at this time.  All issues noted in this document were discussed and addressed.  No physical exam was performed (except for noted visual exam findings with Video Visits).  Please refer to the patient's chart (MyChart message for video visits and phone note for telephone visits) for the patient's consent to telehealth for [facility].  Date:  12/01/2018   ID:  Roger Lowe., DOB [birth year], MRN [id number]  Patient Location:  [address]  [place], [place] [address]  Provider location:   [place], [place]  PCP:  [name], MD  Cardiologist:  Carlyle Dolly, MD  Electrophysiologist:  None   Chief Complaint:  F/U from Cardiac Catheterization  History of Present Illness:    Roger Lowe. is a 73 y.o. male who presents via audio/video conferencing for a telehealth visit today. Past medical history includes CAD (cath in 2016 showing mild nonobstructive disease), chronic combined systolic and diastolic CHF (EF 23-76% by echo in 07/2017, at 20-25% by repeat echo in 10/2018), COPD, HTN, HLD, Type 2 DM, and history of PE (on Xarelto).  He was last examined by myself on 11/03/2018 for hospital follow-up from an admission during which he was found to have sepsis secondary to PNA. Cardiology was consulted as his EF was further reduced to 20-25% and plans were for an outpatient catheterization. He reported having intermittent episodes of chest pain and dyspnea on exertion at the time of his office visit and was in agreement for a catheterization. This was performed by Dr. Irish Lack on 11/08/2018 and showed nonobstructive CAD with 10% LM stenosis, 25% Proximal-LAD, 25% LCx, and mild pulmonary HTN. Continued medical therapy was  recommended.   In talking with the patient today, he reports still having baseline dyspnea on exertion but denies any recent changes in this. Remains on 2L [place] at baseline but goes without this intermittently throughout the day and saturations remain appropriate. Reports one episode of chest pain since his cath and this resolved with SL NTG. No recent orthopnea, PND, lower extremity edema or palpitations. No dizziness or presyncope.   Does check his BP at home and this has overall been well-controlled with SBP in the 120's to 130's. Says it was elevated in the 150's today but this is unusual for him.   The patient does not have symptoms concerning for COVID-19 infection (fever, chills, cough, or new shortness of breath).    Prior CV studies:   The following studies were reviewed today:  Echocardiogram: 10/12/2018 IMPRESSIONS  1. The left ventricle has severely reduced systolic function of 28-31%. The cavity size was normal. There is mild concentric left ventricular hypertrophy. Indeterminate diastolic function.  2. There is akinesis of the mid-apical anterior and anteroseptal left ventricular segments. Overall septal motion suggests left bundle branch block.  3. The aortic valve is tricuspid There is mild aortic annular calcification noted.  4. The mitral valve is normal in structure. There is mild calcification.  5. The tricuspid valve is normal in structure.  6. The aortic root is normal in size and structure.  7. Right atrial size was mildly dilated.  8. The right ventricle has moderately reduced systolic function. The cavity was normal. There is no increase in right ventricular wall thickness.  Right ventricular systolic pressure could not be assessed.  Cardiac Catheterization: 11/08/2018  Mid LM lesion is 10% stenosed.  Prox Cx to Mid Cx lesion is 25% stenosed.  Prox LAD lesion is 25% stenosed.  There is moderate left ventricular systolic dysfunction.  There is no aortic valve  stenosis.  Hemodynamic findings consistent with mild pulmonary hypertension.  Ao sat 98%, PA sat 72%, PA mean 33 mm Hg, PCWP mean 31 mm Hg; CO 5.99 L/min; CI 2.63   Nonobstructive CAD.  Continue medical therapy for nonischemic cardiomyopathy.    Past Medical History:  Diagnosis Date   Asthma    Cancer ([place])    CHF (congestive heart failure) ([place])    a. EF 45-50% by echo in 07/2017 b. EF reduced to 20-25% by repeat echo in 10/2018   Coronary artery disease    a. cath in 2016 showing mild nonobstructive disease b. cath in 10/2018 showing nonobstructive CAD with 10% LM stenosis, 25% Proximal-LAD, 25% LCx, and mild pulmonary HTN   Diabetes mellitus without complication (HCC)    Gout    History of pulmonary embolus (PE) 2016   Hypertension    Lung cancer ([place])    Pneumonia    Past Surgical History:  Procedure Laterality Date   CERVICAL SPINE SURGERY     RIGHT/LEFT HEART CATH AND CORONARY ANGIOGRAPHY N/A 11/08/2018   Procedure: RIGHT/LEFT HEART CATH AND CORONARY ANGIOGRAPHY;  Surgeon: [name], MD;  Location: [facility];  Service: Cardiovascular;  Laterality: N/A;     Current Meds  Medication Sig   albuterol (PROVENTIL HFA;VENTOLIN HFA) 108 (90 Base) MCG/ACT inhaler Inhale 2 puffs into the lungs every 4 (four) hours as needed for wheezing or shortness of breath.   allopurinol (ZYLOPRIM) 300 MG tablet Take 300 mg by mouth daily.   amitriptyline (ELAVIL) 50 MG tablet Take 50 mg by mouth 2 (two) times daily.   atorvastatin (LIPITOR) 20 MG tablet Take 1 tablet (20 mg total) by mouth at bedtime.   budesonide-formoterol (SYMBICORT) 160-4.5 MCG/ACT inhaler Inhale 2 puffs into the lungs 2 (two) times daily.   carvedilol (COREG) 25 MG tablet Take 1 tablet (25 mg total) by mouth 2 (two) times daily.   famotidine (PEPCID) 40 MG tablet Take 40 mg by mouth at bedtime.   furosemide (LASIX) 20 MG tablet Take 1 tablet (20 mg total) by mouth daily as needed  for fluid.   hydrALAZINE (APRESOLINE) 50 MG tablet Take 1 tablet (50 mg total) by mouth 2 (two) times daily.   HYDROcodone-acetaminophen (NORCO) 10-325 MG tablet Take 1 tablet by mouth every 6 (six) hours as needed for moderate pain.   insulin aspart (NOVOLOG FLEXPEN) 100 UNIT/ML FlexPen Inject 25 Units into the skin 3 (three) times daily with meals. Plus personal sliding scale   Insulin Detemir (LEVEMIR FLEXTOUCH) 100 UNIT/ML Pen Inject 45 Units into the skin at bedtime.   ipratropium-albuterol (DUONEB) 0.5-2.5 (3) MG/3ML SOLN Take 3 mLs by nebulization every 4 (four) hours as needed. (Patient taking differently: Take 3 mLs by nebulization every 4 (four) hours as needed (shortness of breath). )   losartan (COZAAR) 100 MG tablet Take 1 tablet (100 mg total) by mouth daily.   nitroGLYCERIN (NITROSTAT) 0.4 MG SL tablet Place 1 tablet (0.4 mg total) under the tongue every 5 (five) minutes as needed for chest pain.   rivaroxaban (XARELTO) 20 MG TABS tablet Take 1 tablet (20 mg total) by mouth every morning.     Allergies:  Patient has no known allergies.   Social History   Tobacco Use   Smoking status: Former Smoker    Last attempt to quit: 09/05/2003    Years since quitting: 15.2   Smokeless tobacco: Never Used  Substance Use Topics   Alcohol use: Not Currently   Drug use: Not Currently     Family Hx: The patient's family history includes CAD in his brother.  ROS:   Please see the history of present illness.     All other systems reviewed and are negative.   Labs/Other Tests and Data Reviewed:    Recent Labs: 10/11/2018: B Natriuretic Peptide 499.0 10/12/2018: ALT 13; Magnesium 2.0 10/14/2018: BUN 17; Creatinine, Ser 1.07; Platelets 199 11/08/2018: Hemoglobin 14.6; Potassium 3.9; Sodium 141   Recent Lipid Panel No results found for: CHOL, TRIG, HDL, CHOLHDL, LDLCALC, LDLDIRECT  Wt Readings from Last 3 Encounters:  12/01/18 230 lb (104.3 kg)  11/08/18 234 lb (106.1  kg)  11/03/18 234 lb (106.1 kg)     Objective:    Vital Signs:  BP (!) 152/97    Pulse (!) 101    Ht 6' (1.829 m)    Wt 230 lb (104.3 kg)    BMI 31.19 kg/m    General: Pleasant African American male appearing in NAD Psych: Normal affect. Neuro: Alert and oriented X 3. Moves all extremities spontaneously. HEENT: Normal in appearance. No nasal discharge. Nasal cannula in place.  Lungs:  Respirations appear unlabored.  Extremities: No clubbing, cyanosis or edema. Radial site without swelling or ecchymosis.   ASSESSMENT & PLAN:    1. Chronic Combined Systolic and Diastolic CHF/ Nonischemic Cardiomyopathy - EF was previously 45-50% in 2018, found to be reduced to 20-25% by repeat echo in 10/2018. Repeat cath showed nonobstructive CAD as outlined above.  - he has baseline dyspnea on exertion but denies any recent changes in this. No orthopnea, PND, or edema.  - he is currently on Coreg 25mg  BID, Hydralazine 50mg  BID, and Losartan 100mg  daily. I wish to change his Losartan to [facility] in the setting of his cardiomyopathy but am limited in obtaining follow-up labs in an effort to avoid patient's exposure in the Coronavirus. Once the current scenario improves, will plan to stop Losartan and switch to Entresto 24-26mg  BID with a repeat BMET 2 weeks following this. I have sent a reminder to myself in Epic to follow-up on the medication change. Will continue on Lasix 20mg  daily as weight has been stable on his home scales.   2. HTN - mostly well-controlled per the patient's report when checked at home. Continue current regimen for now. Will need to follow closely when transitioning from Losartan to [facility].   3. HLD - remains on Atorvastatin 20mg  daily. Goal LDL is less than 70 with known CAD.  4. COPD - on 2L [place] at baseline. Saturations have been appropriate when checked at home per patient's report.   5. History of PE - he denies any evidence of active bleeding. Remains on Xarelto for  anticoagulation.    COVID-19 Education: The signs and symptoms of COVID-19 were discussed with the patient and how to seek care for testing (follow up with PCP or arrange E-visit).  The importance of social distancing was discussed today.  Patient Risk:   After full review of this patient's clinical status, I feel that they are at least moderate risk at this time.  Time:   Today, I have spent 20 minutes with the patient with telehealth technology  discussing the above cardiac issues.      Medication Adjustments/Labs and Tests Ordered: Current medicines are reviewed at length with the patient today.  Concerns regarding medicines are outlined above.  Tests Ordered: None  Medication Changes: None at this time. Will plan to switch to [facility] as outlined above once follow-up labs can be safely obtained.   Disposition:  Follow up in 3 month(s) with Dr. Harl Bowie  Signed, Erma Heritage, PA-C  12/01/2018 4:52 PM    [place]

## 2019-02-01 ENCOUNTER — Emergency Department (HOSPITAL_COMMUNITY)
Admission: EM | Admit: 2019-02-01 | Discharge: 2019-02-01 | Disposition: A | Discharge status: Home / Self Care | Payer: Medicare HMO | Attending: Emergency Medicine | Admitting: Emergency Medicine

## 2019-02-01 ENCOUNTER — Encounter (HOSPITAL_COMMUNITY): Payer: Self-pay

## 2019-02-01 ENCOUNTER — Other Ambulatory Visit: Payer: Self-pay

## 2019-02-01 ENCOUNTER — Emergency Department (HOSPITAL_COMMUNITY): Payer: Medicare HMO

## 2019-02-01 DIAGNOSIS — I5020 Unspecified systolic (congestive) heart failure: Secondary | ICD-10-CM | POA: Diagnosis not present

## 2019-02-01 DIAGNOSIS — Z85118 Personal history of other malignant neoplasm of bronchus and lung: Secondary | ICD-10-CM | POA: Diagnosis not present

## 2019-02-01 DIAGNOSIS — Z79899 Other long term (current) drug therapy: Secondary | ICD-10-CM | POA: Diagnosis not present

## 2019-02-01 DIAGNOSIS — R059 Cough, unspecified: Secondary | ICD-10-CM

## 2019-02-01 DIAGNOSIS — H6092 Unspecified otitis externa, left ear: Secondary | ICD-10-CM | POA: Insufficient documentation

## 2019-02-01 DIAGNOSIS — Z7901 Long term (current) use of anticoagulants: Secondary | ICD-10-CM | POA: Insufficient documentation

## 2019-02-01 DIAGNOSIS — J45909 Unspecified asthma, uncomplicated: Secondary | ICD-10-CM | POA: Insufficient documentation

## 2019-02-01 DIAGNOSIS — N289 Disorder of kidney and ureter, unspecified: Secondary | ICD-10-CM | POA: Diagnosis not present

## 2019-02-01 DIAGNOSIS — Z87891 Personal history of nicotine dependence: Secondary | ICD-10-CM | POA: Insufficient documentation

## 2019-02-01 DIAGNOSIS — Z20828 Contact with and (suspected) exposure to other viral communicable diseases: Secondary | ICD-10-CM | POA: Diagnosis not present

## 2019-02-01 DIAGNOSIS — R05 Cough: Secondary | ICD-10-CM

## 2019-02-01 DIAGNOSIS — I110 Hypertensive heart disease with heart failure: Secondary | ICD-10-CM | POA: Insufficient documentation

## 2019-02-01 DIAGNOSIS — E119 Type 2 diabetes mellitus without complications: Secondary | ICD-10-CM | POA: Insufficient documentation

## 2019-02-01 DIAGNOSIS — I2510 Atherosclerotic heart disease of native coronary artery without angina pectoris: Secondary | ICD-10-CM | POA: Insufficient documentation

## 2019-02-01 DIAGNOSIS — Z794 Long term (current) use of insulin: Secondary | ICD-10-CM | POA: Diagnosis not present

## 2019-02-01 DIAGNOSIS — H60502 Unspecified acute noninfective otitis externa, left ear: Secondary | ICD-10-CM

## 2019-02-01 DIAGNOSIS — R509 Fever, unspecified: Secondary | ICD-10-CM | POA: Diagnosis present

## 2019-02-01 LAB — CBC WITH DIFFERENTIAL/PLATELET
Abs Immature Granulocytes: 0.04 10*3/uL (ref 0.00–0.07)
Basophils Absolute: 0 10*3/uL (ref 0.0–0.1)
Basophils Relative: 1 %
Eosinophils Absolute: 0.2 10*3/uL (ref 0.0–0.5)
Eosinophils Relative: 2 %
HCT: 51.2 % (ref 39.0–52.0)
Hemoglobin: 16.6 g/dL (ref 13.0–17.0)
Immature Granulocytes: 1 %
Lymphocytes Relative: 26 %
Lymphs Abs: 2.3 10*3/uL (ref 0.7–4.0)
MCH: 29.7 pg (ref 26.0–34.0)
MCHC: 32.4 g/dL (ref 30.0–36.0)
MCV: 91.6 fL (ref 80.0–100.0)
Monocytes Absolute: 0.8 10*3/uL (ref 0.1–1.0)
Monocytes Relative: 9 %
Neutro Abs: 5.4 10*3/uL (ref 1.7–7.7)
Neutrophils Relative %: 61 %
Platelets: 209 10*3/uL (ref 150–400)
RBC: 5.59 MIL/uL (ref 4.22–5.81)
RDW: 14 % (ref 11.5–15.5)
WBC: 8.8 10*3/uL (ref 4.0–10.5)
nRBC: 0 % (ref 0.0–0.2)

## 2019-02-01 LAB — COMPREHENSIVE METABOLIC PANEL
ALT: 15 U/L (ref 0–44)
AST: 16 U/L (ref 15–41)
Albumin: 4 g/dL (ref 3.5–5.0)
Alkaline Phosphatase: 88 U/L (ref 38–126)
Anion gap: 10 (ref 5–15)
BUN: 17 mg/dL (ref 8–23)
CO2: 24 mmol/L (ref 22–32)
Calcium: 9.6 mg/dL (ref 8.9–10.3)
Chloride: 103 mmol/L (ref 98–111)
Creatinine, Ser: 1.42 mg/dL — ABNORMAL HIGH (ref 0.61–1.24)
GFR calc Af Amer: 58 mL/min — ABNORMAL LOW (ref >60)
GFR calc non Af Amer: 50 mL/min — ABNORMAL LOW (ref >60)
Glucose, Bld: 142 mg/dL — ABNORMAL HIGH (ref 70–99)
Potassium: 5.5 mmol/L — ABNORMAL HIGH (ref 3.5–5.1)
Sodium: 137 mmol/L (ref 135–145)
Total Bilirubin: 0.6 mg/dL (ref 0.3–1.2)
Total Protein: 8.3 g/dL — ABNORMAL HIGH (ref 6.5–8.1)

## 2019-02-01 LAB — URINALYSIS, ROUTINE W REFLEX MICROSCOPIC
Bilirubin Urine: NEGATIVE
Glucose, UA: NEGATIVE mg/dL
Hgb urine dipstick: NEGATIVE
Ketones, ur: NEGATIVE mg/dL
Leukocytes,Ua: NEGATIVE
Nitrite: NEGATIVE
Protein, ur: NEGATIVE mg/dL
Specific Gravity, Urine: 1.016 (ref 1.005–1.030)
pH: 6 (ref 5.0–8.0)

## 2019-02-01 LAB — BLOOD GAS, VENOUS
Acid-Base Excess: 2.2 mmol/L — ABNORMAL HIGH (ref 0.0–2.0)
Bicarbonate: 24.3 mmol/L (ref 20.0–28.0)
FIO2: 28
O2 Saturation: 60 %
Patient temperature: 36.7
pCO2, Ven: 48.9 mmHg (ref 44.0–60.0)
pH, Ven: 7.362 (ref 7.250–7.430)
pO2, Ven: 36.8 mmHg (ref 32.0–45.0)

## 2019-02-01 LAB — LACTIC ACID, PLASMA
Lactic Acid, Venous: 1.3 mmol/L (ref 0.5–1.9)
Lactic Acid, Venous: 0.9 mmol/L (ref 0.5–1.9)

## 2019-02-01 LAB — SARS CORONAVIRUS 2 BY RT PCR (HOSPITAL ORDER, PERFORMED IN ~~LOC~~ HOSPITAL LAB): SARS Coronavirus 2: NEGATIVE

## 2019-02-01 IMAGING — CR PORTABLE CHEST - 1 VIEW
1 series · 2 of 2 positions shown · non-contrast
Comparison: Chest radiograph and chest CT [DATE]

CLINICAL DATA: Cough and fever.  History of lung carcinoma

EXAM:
PORTABLE CHEST 1 VIEW

[Series 1: portable · 0.17mm/px · 2 of 2 slices shown]
[im 1/2]
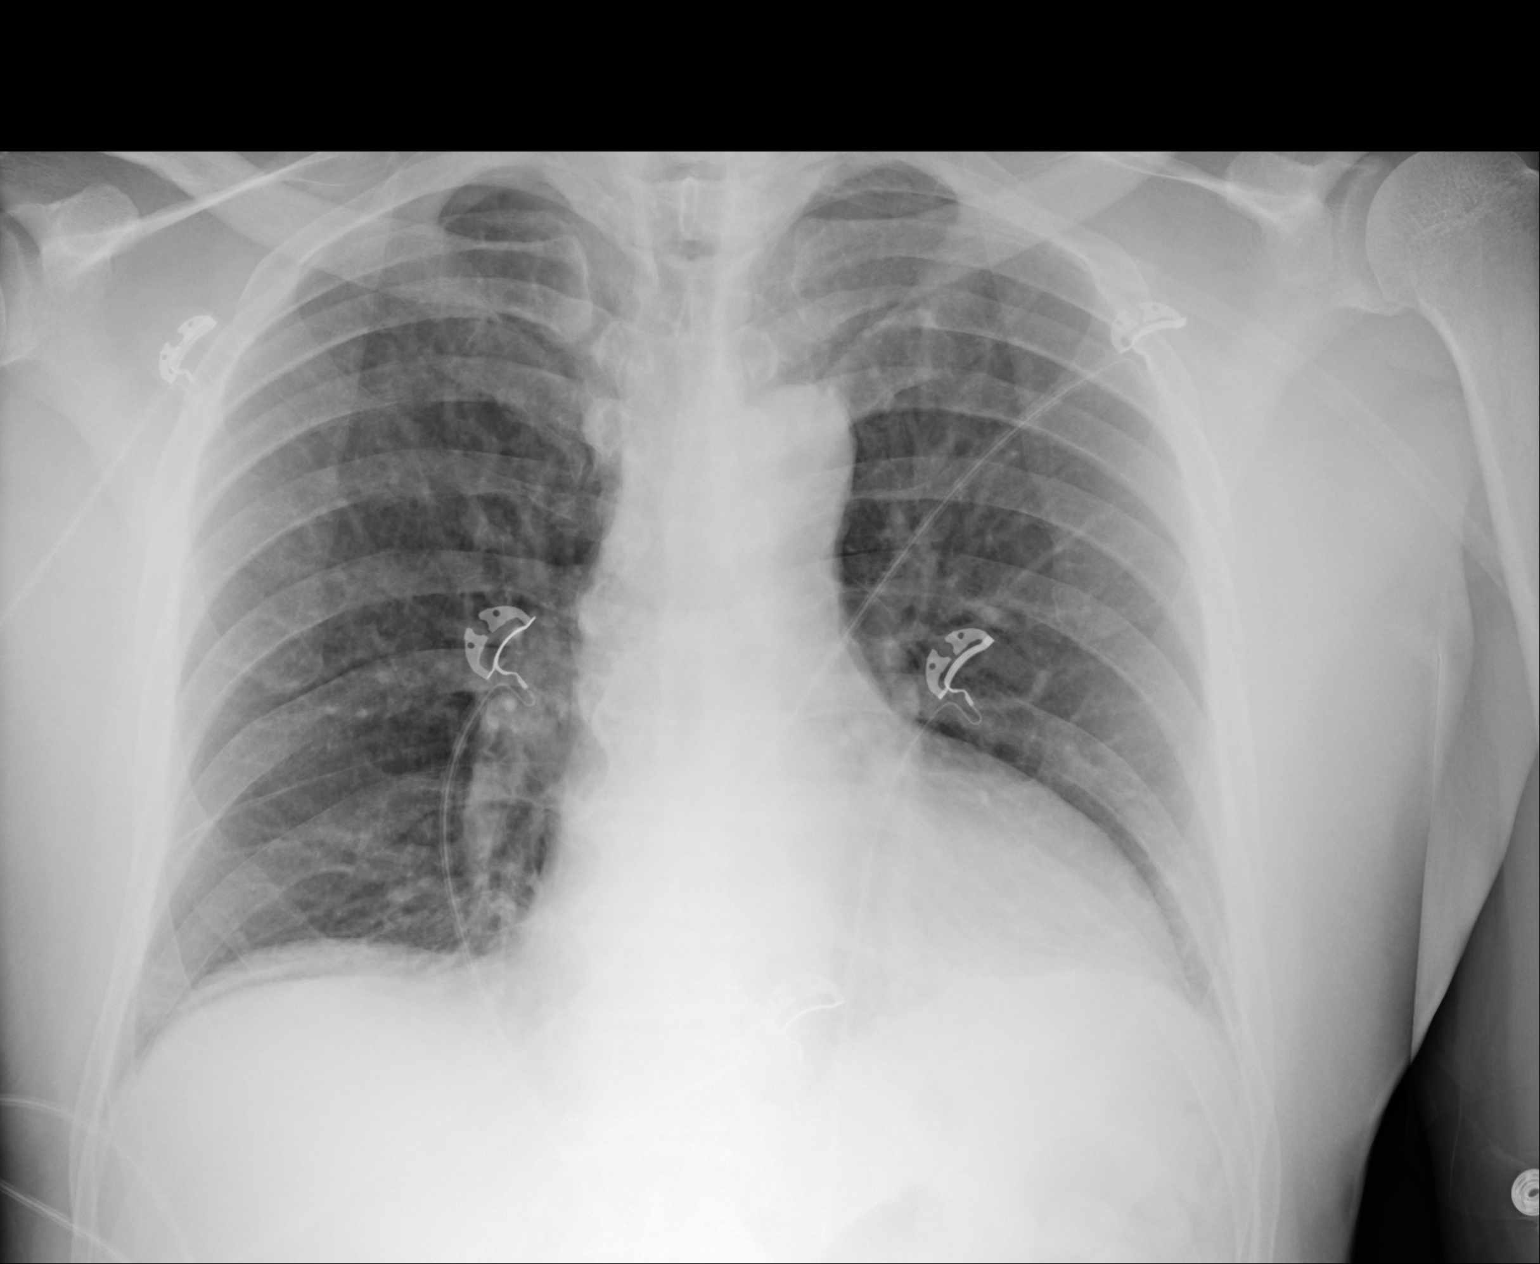
[im 2/2]
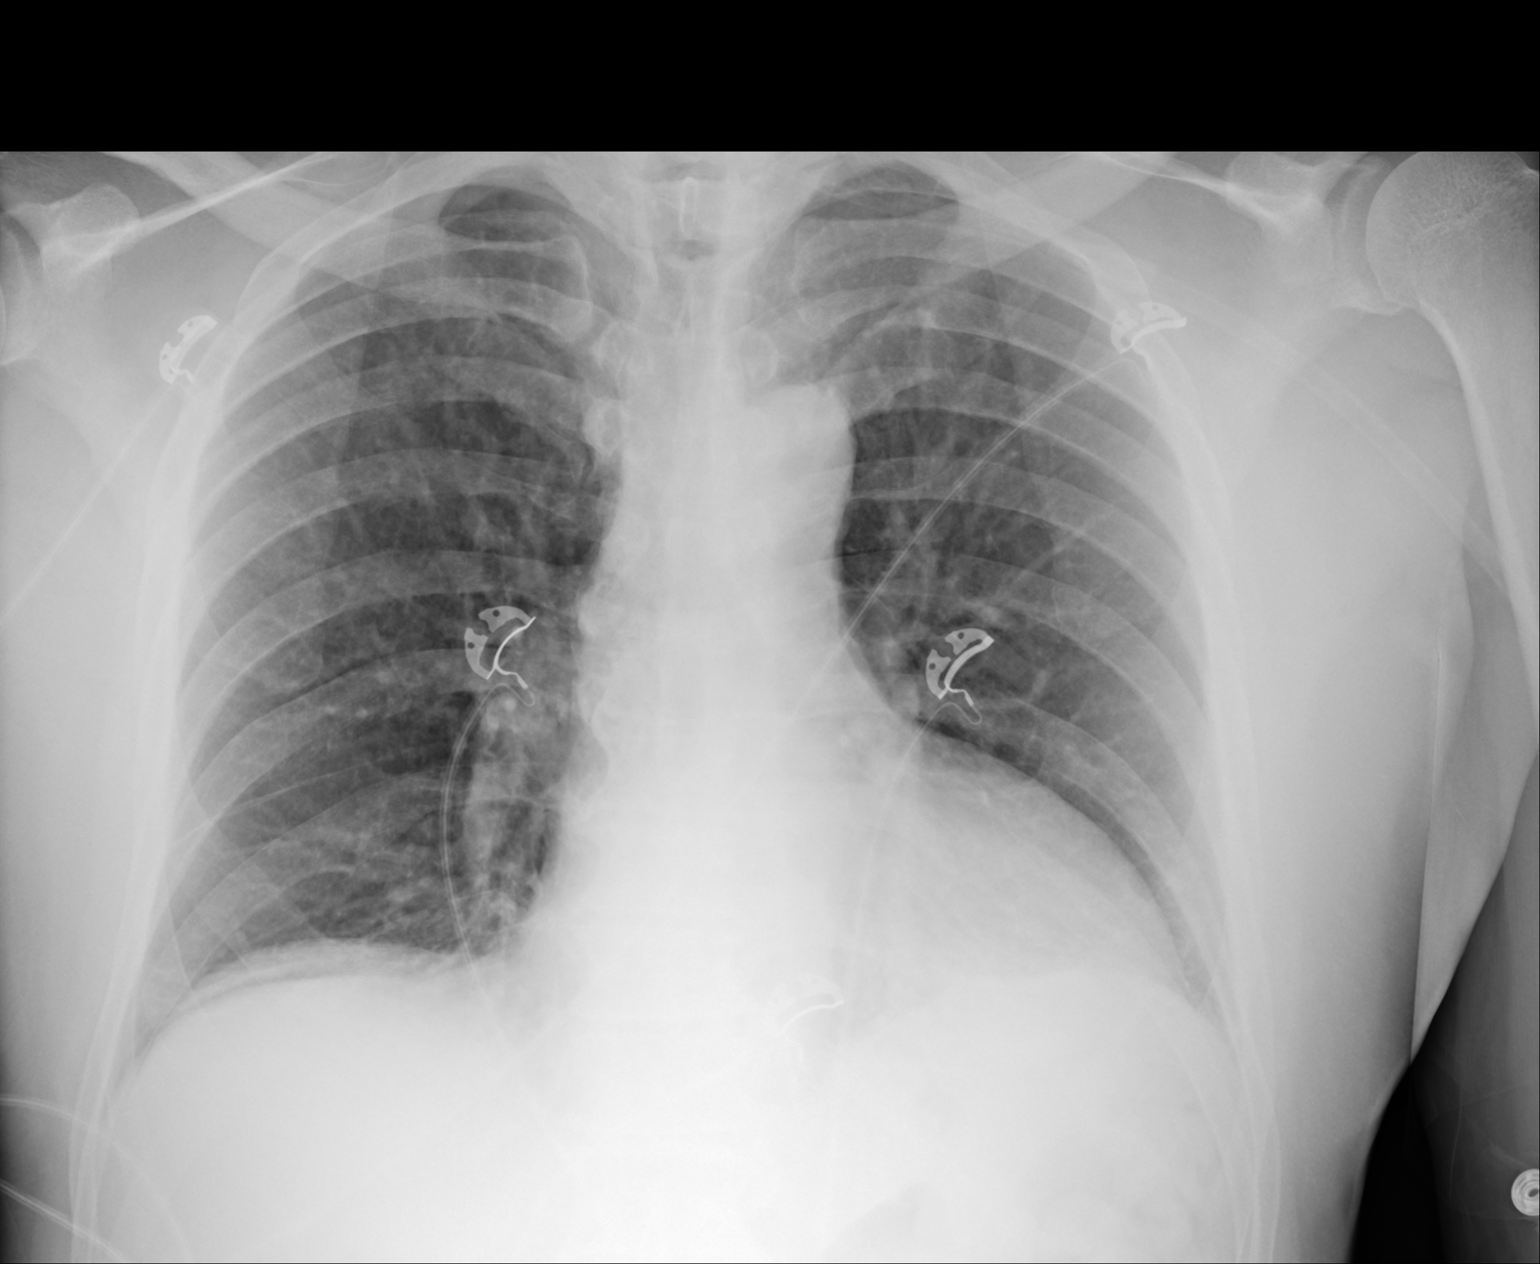

[2 of 2 positions shown; findings below may reference images not displayed]

FINDINGS: There is atelectatic change in the right base. There is no edema or
consolidation. Heart is mildly enlarged with pulmonary vascularity.
No adenopathy. No bone lesions.
IMPRESSION: Right base atelectasis. Lungs elsewhere clear. Heart mildly
enlarged. No evident adenopathy.

## 2019-02-01 MED ORDER — NEOMYCIN-POLYMYXIN-HC 3.5-10000-1 OT SUSP: 4.0000 [drp] | Freq: Four times a day (QID) | OTIC | 0 refills | Status: DC

## 2019-02-01 MED ORDER — SODIUM CHLORIDE 0.9 % IV BOLUS
1000.0000 mL | Freq: Once | INTRAVENOUS | Status: AC
  Administered 2019-02-01: 1000 mL via INTRAVENOUS

## 2019-02-01 NOTE — Discharge Instructions (Addendum)
Your left ear pain appears to be secondary to an external ear infection.  Try rinsing out your ear well with warm water, while in the shower, once or twice a day.  We have prescribed an antibiotic drop to use to treat an ear infection which should help as well.  The testing indicated that you are a little bit hydrated.  Try to drink an extra 1 to 2 L of water each day, to improve your kidney function and lower your potassium level.  Make sure that you see your doctor next week for a checkup on your problems and repeat blood testing.

## 2019-02-01 NOTE — ED Triage Notes (Addendum)
Pt presents to ED with multiple complaints. Pt has left ear pain and bleeding x 1 week and jaw pain from bottom of left ear all the way around to right ear. Pt also complaining of productive cough x 1 week. Pt states he has had a fever of 102 at highest, off and on for approx 4 days.

## 2019-02-01 NOTE — ED Provider Notes (Signed)
[facility] EMERGENCY DEPARTMENT Provider Note   CSN: [id number] Arrival date & time: 02/01/19  4098    History   Chief Complaint Chief Complaint  Patient presents with  . Multiple Complaints    HPI Roger Lowe. is a 73 y.o. male.     HPI   He presents for evaluation of difficulty eating, fever, ear pain, bleeding from ears, and general weakness, for 6 days.  He states he frequently gets pneumonia.  He has chronic shortness of breath and currently has a productive cough.  Denies chest pain, abdominal pain, back pain, focal weakness or paresthesia.  He did not eat breakfast this morning or take his medicines.  He denies known sick exposures or exposure to [place].  He complains of ear drainage bilaterally and occasionally gets blood on a Q-tip when he tries to clean his  ears.  There are no other known modifying factors.  Past Medical History:  Diagnosis Date  . Asthma   . Cancer ([place])   . CHF (congestive heart failure) ([place])    a. EF 45-50% by echo in 07/2017 b. EF reduced to 20-25% by repeat echo in 10/2018  . Coronary artery disease    a. cath in 2016 showing mild nonobstructive disease b. cath in 10/2018 showing nonobstructive CAD with 10% LM stenosis, 25% Proximal-LAD, 25% LCx, and mild pulmonary HTN  . Diabetes mellitus without complication ([place])   . Gout   . History of pulmonary embolus (PE) 2016  . Hypertension   . Lung cancer ([place])   . Pneumonia     Patient Active Problem List   Diagnosis Date Noted  . Acute systolic heart failure ([place])   . CAP (community acquired pneumonia) 10/12/2018  . SIRS (systemic inflammatory response syndrome) ([place]) 10/12/2018  . Uncontrolled hypertension 10/12/2018  . Type 2 diabetes mellitus without complication, with long-term current use of insulin ([place]) 10/12/2018  . Chest pain 10/12/2018  . Prolonged QT interval 10/12/2018    Past Surgical History:  Procedure Laterality Date  . CERVICAL SPINE SURGERY    . RIGHT/LEFT HEART  CATH AND CORONARY ANGIOGRAPHY N/A 11/08/2018   Procedure: RIGHT/LEFT HEART CATH AND CORONARY ANGIOGRAPHY;  Surgeon: [name], MD;  Location: [facility];  Service: Cardiovascular;  Laterality: N/A;        Home Medications    Prior to Admission medications   Medication Sig Start Date End Date Taking? Authorizing Provider  albuterol (PROVENTIL HFA;VENTOLIN HFA) 108 (90 Base) MCG/ACT inhaler Inhale 2 puffs into the lungs 2 (two) times a day.    Yes [provider]  allopurinol (ZYLOPRIM) 300 MG tablet Take 300 mg by mouth daily.   Yes [provider]  amitriptyline (ELAVIL) 50 MG tablet Take 50 mg by mouth 2 (two) times daily.   Yes [provider]  atorvastatin (LIPITOR) 20 MG tablet Take 1 tablet (20 mg total) by mouth at bedtime. 11/03/18  Yes Strader, [place] M, PA-C  budesonide-formoterol (SYMBICORT) 160-4.5 MCG/ACT inhaler Inhale 2 puffs into the lungs 2 (two) times daily.   Yes [provider]  carvedilol (COREG) 25 MG tablet Take 1 tablet (25 mg total) by mouth 2 (two) times daily. 11/03/18  Yes Strader, [place] M, PA-C  famotidine (PEPCID) 40 MG tablet Take 40 mg by mouth 2 (two) times daily.    Yes [provider]  furosemide (LASIX) 20 MG tablet Take 1 tablet (20 mg total) by mouth daily as needed for fluid. 11/03/18  Yes  Ahmed Prima, [place] M, PA-C  hydrALAZINE (APRESOLINE) 50 MG tablet Take 1 tablet (50 mg total) by mouth 2 (two) times daily. 11/03/18  Yes Strader, Fransisco Hertz, PA-C  HYDROcodone-acetaminophen (NORCO) 10-325 MG tablet Take 1 tablet by mouth every 6 (six) hours as needed for moderate pain. 10/14/18  Yes Tat, Shanon Brow, MD  insulin aspart (NOVOLOG FLEXPEN) 100 UNIT/ML FlexPen Inject 25 Units into the skin 3 (three) times daily with meals. Plus personal sliding scale   Yes [provider]  Insulin Detemir (LEVEMIR FLEXTOUCH) 100 UNIT/ML Pen Inject 45 Units into the skin at bedtime. 11/03/18  Yes Strader, [place] M,  PA-C  ipratropium-albuterol (DUONEB) 0.5-2.5 (3) MG/3ML SOLN Take 3 mLs by nebulization every 4 (four) hours as needed. Patient taking differently: Take 3 mLs by nebulization every 4 (four) hours as needed (shortness of breath).  10/14/18  Yes Tat, Shanon Brow, MD  losartan (COZAAR) 100 MG tablet Take 1 tablet (100 mg total) by mouth daily. 11/03/18  Yes Strader, [place] M, PA-C  nitroGLYCERIN (NITROSTAT) 0.4 MG SL tablet Place 1 tablet (0.4 mg total) under the tongue every 5 (five) minutes as needed for chest pain. 11/03/18  Yes Strader, [place] M, PA-C  rivaroxaban (XARELTO) 20 MG TABS tablet Take 1 tablet (20 mg total) by mouth every morning. 11/09/18  Yes [name], MD  neomycin-polymyxin-hydrocortisone (CORTISPORIN) 3.5-10000-1 OTIC suspension Place 4 drops into the left ear 4 (four) times daily. X 7 days 02/01/19   Daleen Bo, MD    Family History Family History  Problem Relation Age of Onset  . CAD Brother     Social History Social History   Tobacco Use  . Smoking status: Former Smoker    Last attempt to quit: 09/05/2003    Years since quitting: 15.4  . Smokeless tobacco: Never Used  Substance Use Topics  . Alcohol use: Not Currently  . Drug use: Not Currently     Allergies   Patient has no known allergies.   Review of Systems Review of Systems  All other systems reviewed and are negative.    Physical Exam Updated Vital Signs BP (!) 154/100   Pulse (!) 113   Temp 98.1 F (36.7 C) (Oral)   Resp 20   Ht 6\' 1"  (1.854 m)   Wt 106.1 kg   SpO2 100%   BMI 30.87 kg/m   Physical Exam Vitals signs and nursing note reviewed.  Constitutional:      General: He is not in acute distress.    Appearance: He is well-developed. He is obese. He is ill-appearing. He is not toxic-appearing or diaphoretic.  HENT:     Head: Normocephalic and atraumatic.     Comments: Normal left pinna.  Mild tenderness, left preauricular region without palpable mass.  Cerumen impaction left  EAC.  Unable to visualize left TM.    Right Ear: Tympanic membrane, ear canal and external ear normal.     Nose: No congestion or rhinorrhea.     Mouth/Throat:     Mouth: Mucous membranes are moist.     Pharynx: No oropharyngeal exudate or posterior oropharyngeal erythema.  Eyes:     Conjunctiva/sclera: Conjunctivae normal.     Pupils: Pupils are equal, round, and reactive to light.  Neck:     Musculoskeletal: Normal range of motion and neck supple.     Trachea: Phonation normal.  Cardiovascular:     Rate and Rhythm: Normal rate and regular rhythm.     Heart sounds: Normal heart sounds.  Pulmonary:     Effort: Pulmonary effort is normal. No respiratory distress.     Breath sounds: Normal breath sounds. No stridor. No rhonchi.  Chest:     Chest wall: No tenderness.  Abdominal:     General: There is no distension.     Palpations: Abdomen is soft.     Tenderness: There is no abdominal tenderness.  Musculoskeletal: Normal range of motion.        General: No swelling or tenderness.     Right lower leg: No edema.     Left lower leg: No edema.  Skin:    General: Skin is warm and dry.     Coloration: Skin is not jaundiced or pale.  Neurological:     Mental Status: He is alert and oriented to person, place, and time.     Cranial Nerves: No cranial nerve deficit.     Sensory: No sensory deficit.     Motor: No abnormal muscle tone.     Coordination: Coordination normal.     Comments: No dysarthria or aphasia.  Psychiatric:        Mood and Affect: Mood normal.        Behavior: Behavior normal.        Thought Content: Thought content normal.        Judgment: Judgment normal.      ED Treatments / Results  Labs (all labs ordered are listed, but only abnormal results are displayed) Labs Reviewed  COMPREHENSIVE METABOLIC PANEL - Abnormal; Notable for the following components:      Result Value   Potassium 5.5 (*)    Glucose, Bld 142 (*)    Creatinine, Ser 1.42 (*)    Total  Protein 8.3 (*)    GFR calc non Af Amer 50 (*)    GFR calc Af Amer 58 (*)    All other components within normal limits  BLOOD GAS, VENOUS - Abnormal; Notable for the following components:   Acid-Base Excess 2.2 (*)    All other components within normal limits  CULTURE, BLOOD (ROUTINE X 2)  CULTURE, BLOOD (ROUTINE X 2)  SARS CORONAVIRUS 2 (HOSPITAL ORDER, [place] [facility])  CBC WITH DIFFERENTIAL/PLATELET  LACTIC ACID, PLASMA  LACTIC ACID, PLASMA  URINALYSIS, ROUTINE W REFLEX MICROSCOPIC    EKG EKG Interpretation  Date/Time:  Tuesday February 01 2019 10:14:23 EDT Ventricular Rate:  108 PR Interval:    QRS Duration: 154 QT Interval:  391 QTC Calculation: 525 R Axis:   86 Text Interpretation:  Sinus tachycardia Left bundle branch block Baseline wander in lead(s) II V6 since last tracing no significant change Confirmed by Daleen Bo [phone]) on 02/01/2019 11:46:47 AM   Radiology Dg Chest Port 1 View  Result Date: 02/01/2019 CLINICAL DATA:  Cough and fever.  History of lung carcinoma EXAM: PORTABLE CHEST 1 VIEW COMPARISON:  Chest radiograph and chest CT October 11, 2018 FINDINGS: There is atelectatic change in the right base. There is no edema or consolidation. Heart is mildly enlarged with pulmonary vascularity. No adenopathy. No bone lesions. IMPRESSION: Right base atelectasis. Lungs elsewhere clear. Heart mildly enlarged. No evident adenopathy. Electronically Signed   By: Lowella Grip III M.D.   On: 02/01/2019 10:35    Procedures .Critical Care Performed by: Daleen Bo, MD Authorized by: Daleen Bo, MD   Critical care provider statement:    Critical care time (minutes):  35   Critical care start time:  02/01/2019 10:00 AM  Critical care end time:  02/01/2019 3:43 PM   Critical care time was exclusive of:  Separately billable procedures and treating other patients   Critical care was necessary to treat or prevent imminent or life-threatening  deterioration of the following conditions:  Circulatory failure   Critical care was time spent personally by me on the following activities:  Blood draw for specimens, development of treatment plan with patient or surrogate, discussions with consultants, evaluation of patient's response to treatment, examination of patient, obtaining history from patient or surrogate, ordering and performing treatments and interventions, ordering and review of laboratory studies, pulse oximetry, re-evaluation of patient's condition, review of old charts and ordering and review of radiographic studies   (including critical care time)  Medications Ordered in ED Medications  sodium chloride 0.9 % bolus 1,000 mL (0 mLs Intravenous Stopped 02/01/19 1346)     Initial Impression / Assessment and Plan / ED Course  I have reviewed the triage vital signs and the nursing notes.  Pertinent labs & imaging results that were available during my care of the patient were reviewed by me and considered in my medical decision making (see chart for details).  Clinical Course as of Jan 31 1542  Tue Feb 01, 2019  1426 Normal  Urinalysis, Routine w reflex microscopic [EW]  1426 Normal  Blood gas, venous(!) [EW]  1427 Normal  CBC with Differential [EW]  1427 Normal  Lactic acid, plasma [EW]  1427 Normal except potassium high, glucose high, creatinine high, total protein high, GFR low  Comprehensive metabolic panel(!) [EW]  2458 negative  SARS Coronavirus 2 (CEPHEID- Performed in [place] hospital lab),  [facility] Order [EW]  0998 No infiltrate or CHF, image reviewed by me  DG Chest [place] 1 View [EW]    Clinical Course User Index [EW] Daleen Bo, MD        Patient Vitals for the past 24 hrs:  BP Temp Temp src Pulse Resp SpO2 Height Weight  02/01/19 1500 (!) 154/100 - - (!) 113 20 100 % - -  02/01/19 1430 - - - 100 17 100 % - -  02/01/19 1400 - - - (!) 101 17 100 % - -  02/01/19 1330 (!) 144/108 - - (!) 101 16 100 %  - -  02/01/19 1300 99/64 - - 100 17 100 % - -  02/01/19 1200 - - - (!) 104 15 99 % - -  02/01/19 1100 136/89 - - (!) 107 17 99 % - -  02/01/19 1045 - - - (!) 105 12 99 % - -  02/01/19 1015 - - - (!) 108 14 99 % - -  02/01/19 1003 (!) 152/96 98.1 F (36.7 C) Oral (!) 118 18 98 % - -  02/01/19 1000 - - - - - - 6\' 1"  (1.854 m) 106.1 kg    2:36 PM Reevaluation with update and discussion. After initial assessment and treatment, an updated evaluation reveals he is fairly comfortable and states that he feels some better after fluids.  Nursing was asked to irrigate the left ear for removal of cerumen. Daleen Bo   Medical Decision Making: Malaise with nonspecific difficulty eating.  Mild insufficiency and elevation of potassium likely from volume depletion.  Left ear pain with cerumen impaction.  Possible left external otitis.  CRITICAL CARE-no Performed by: Daleen Bo   Patient Vitals for the past 24 hrs:  BP Temp Temp src Pulse Resp SpO2 Height Weight  02/01/19 1500 (!) 154/100 - - [name])  113 20 100 % - -  02/01/19 1430 - - - 100 17 100 % - -  02/01/19 1400 - - - (!) 101 17 100 % - -  02/01/19 1330 (!) 144/108 - - (!) 101 16 100 % - -  02/01/19 1300 99/64 - - 100 17 100 % - -  02/01/19 1200 - - - (!) 104 15 99 % - -  02/01/19 1100 136/89 - - (!) 107 17 99 % - -  02/01/19 1045 - - - (!) 105 12 99 % - -  02/01/19 1015 - - - (!) 108 14 99 % - -  02/01/19 1003 (!) 152/96 98.1 F (36.7 C) Oral (!) 118 18 98 % - -  02/01/19 1000 - - - - - - 6\' 1"  (1.854 m) 106.1 kg    3:41 PM Reevaluation with update and discussion. After initial assessment and treatment, an updated evaluation reveals left external auditory canal remains occluded with wax, following irrigation.  Patient instructed on home techniques for wax removal by me.  All questions answered. Daleen Bo   Medical Decision Making: Nonspecific malaise with dehydration, decreased oral intake and left external otitis.  Mild elevation  of renal function from baseline with potassium elevation.  Patient treated in the ED with IV fluid bolus.  Patient is nontoxic and a candidate for discharge.  He has access to follow-up care.  CRITICAL CARE-S Performed by: Daleen Bo   Nursing Notes Reviewed/ Care Coordinated Applicable Imaging Reviewed Interpretation of Laboratory Data incorporated into ED treatment  The patient appears reasonably screened and/or stabilized for discharge and I doubt any other medical condition or other [facility] requiring further screening, evaluation, or treatment in the ED at this time prior to discharge.  Plan: Home Medications-continue usual; Home Treatments-rest, fluids, 1 to 2 L of water each day; return here if the recommended treatment, does not improve the symptoms; Recommended follow up-PCP checkup 1 week and as needed    Final Clinical Impressions(s) / ED Diagnoses   Final diagnoses:  Acute otitis externa of left ear, unspecified type  Renal insufficiency    ED Discharge Orders         Ordered    neomycin-polymyxin-hydrocortisone (CORTISPORIN) 3.5-10000-1 OTIC suspension  4 times daily     02/01/19 1539           Daleen Bo, MD 02/01/19 1543

## 2019-02-01 NOTE — ED Notes (Signed)
Irrigated ear x3 with small amount of earwax removed.

## 2019-02-06 LAB — CULTURE, BLOOD (ROUTINE X 2)
Culture: NO GROWTH
Culture: NO GROWTH
Special Requests: ADEQUATE
Special Requests: ADEQUATE

## 2019-04-01 ENCOUNTER — Ambulatory Visit: Payer: Medicare HMO | Admitting: Cardiology

## 2019-04-04 ENCOUNTER — Telehealth (INDEPENDENT_AMBULATORY_CARE_PROVIDER_SITE_OTHER): Payer: Medicare Other | Admitting: Cardiology

## 2019-04-04 ENCOUNTER — Encounter: Payer: Self-pay | Admitting: Cardiology

## 2019-04-04 VITALS — BP 128/82 | HR 90 | Ht 73.0 in | Wt 233.0 lb

## 2019-04-04 DIAGNOSIS — E875 Hyperkalemia: Secondary | ICD-10-CM

## 2019-04-04 DIAGNOSIS — I5022 Chronic systolic (congestive) heart failure: Secondary | ICD-10-CM

## 2019-04-04 DIAGNOSIS — I2510 Atherosclerotic heart disease of native coronary artery without angina pectoris: Secondary | ICD-10-CM

## 2019-04-04 NOTE — Progress Notes (Signed)
Virtual Visit via Video Note   This visit type was conducted due to national recommendations for restrictions regarding the COVID-19 Pandemic (e.g. social distancing) in an effort to limit this patient's exposure and mitigate transmission in our community.  Due to his co-morbid illnesses, this patient is at least at moderate risk for complications without adequate follow up.  This format is felt to be most appropriate for this patient at this time.  All issues noted in this document were discussed and addressed.  A limited physical exam was performed with this format.  Please refer to the patient's chart for his consent to telehealth for [facility].   Date:  04/04/2019   ID:  Roger Lowe., DOB 1945/10/17, MRN [id number]  Patient Location: Home Provider Location: Home  PCP:  [name], MD  Cardiologist:  Carlyle Dolly, MD  Electrophysiologist:  None   Evaluation Performed:  Follow-Up Visit  Chief Complaint:  Follow up visit  History of Present Illness:    Roger Lowe. is a 73 y.o. male seen today for follow up of the following medical problems   1. CAD - previously followed a [facility] - 12/2017 LVEF 45% by there records, chronic LBBB - 2016 cath [facility]: nonobstructive disease - 10/2018 cath [name]: nonobstructive disease  - no recent chest pain. No ASA since on xarelto  2. Chronic systolic HF - new diagnosis of sysotlic HF 04/5884 - 0/2774 echo LVEF 20-25%. - 10/2018 cath: nonobstructive CAD. Mean PA 33, PCWP 31, CI 2.6   - breathing has improved. Mild swelling at times. Weights are stable around 233 lbs - compliant with meds  3. History of DVT - on xarelto in definite   4. COPD - on 2L [place] at home      The patient does not have symptoms concerning for COVID-19 infection (fever, chills, cough, or new shortness of breath).    Past Medical History:  Diagnosis Date  . Asthma   . Cancer ([place])   . CHF (congestive heart failure) ([place])    a. EF  45-50% by echo in 07/2017 b. EF reduced to 20-25% by repeat echo in 10/2018  . Coronary artery disease    a. cath in 2016 showing mild nonobstructive disease b. cath in 10/2018 showing nonobstructive CAD with 10% LM stenosis, 25% Proximal-LAD, 25% LCx, and mild pulmonary HTN  . Diabetes mellitus without complication ([place])   . Gout   . History of pulmonary embolus (PE) 2016  . Hypertension   . Lung cancer ([place])   . Pneumonia    Past Surgical History:  Procedure Laterality Date  . CERVICAL SPINE SURGERY    . RIGHT/LEFT HEART CATH AND CORONARY ANGIOGRAPHY N/A 11/08/2018   Procedure: RIGHT/LEFT HEART CATH AND CORONARY ANGIOGRAPHY;  Surgeon: [name], MD;  Location: [facility];  Service: Cardiovascular;  Laterality: N/A;     Current Meds  Medication Sig  . albuterol (PROVENTIL HFA;VENTOLIN HFA) 108 (90 Base) MCG/ACT inhaler Inhale 2 puffs into the lungs 2 (two) times a day.   . allopurinol (ZYLOPRIM) 300 MG tablet Take 300 mg by mouth daily.  [name] amitriptyline (ELAVIL) 50 MG tablet Take 50 mg by mouth 2 (two) times daily.  [name] atorvastatin (LIPITOR) 20 MG tablet Take 1 tablet (20 mg total) by mouth at bedtime.  . budesonide-formoterol (SYMBICORT) 160-4.5 MCG/ACT inhaler Inhale 2 puffs into the lungs 2 (two) times daily.  . carvedilol (COREG) 25 MG tablet Take 1 tablet (  25 mg total) by mouth 2 (two) times daily.  . famotidine (PEPCID) 40 MG tablet Take 40 mg by mouth 2 (two) times daily.   . furosemide (LASIX) 20 MG tablet Take 1 tablet (20 mg total) by mouth daily as needed for fluid.  . hydrALAZINE (APRESOLINE) 50 MG tablet Take 1 tablet (50 mg total) by mouth 2 (two) times daily.  [name] HYDROcodone-acetaminophen (NORCO) 10-325 MG tablet Take 1 tablet by mouth every 6 (six) hours as needed for moderate pain.  [name] insulin aspart (NOVOLOG FLEXPEN) 100 UNIT/ML FlexPen Inject 25 Units into the skin 3 (three) times daily with meals. Plus personal sliding scale  . Insulin Detemir (LEVEMIR  FLEXTOUCH) 100 UNIT/ML Pen Inject 45 Units into the skin at bedtime.  [name] ipratropium-albuterol (DUONEB) 0.5-2.5 (3) MG/3ML SOLN Take 3 mLs by nebulization every 4 (four) hours as needed. (Patient taking differently: Take 3 mLs by nebulization every 4 (four) hours as needed (shortness of breath). )  . losartan (COZAAR) 100 MG tablet Take 1 tablet (100 mg total) by mouth daily.  [name] neomycin-polymyxin-hydrocortisone (CORTISPORIN) 3.5-10000-1 OTIC suspension Place 4 drops into the left ear 4 (four) times daily. X 7 days  . nitroGLYCERIN (NITROSTAT) 0.4 MG SL tablet Place 1 tablet (0.4 mg total) under the tongue every 5 (five) minutes as needed for chest pain.  . rivaroxaban (XARELTO) 20 MG TABS tablet Take 1 tablet (20 mg total) by mouth every morning.     Allergies:   Patient has no known allergies.   Social History   Tobacco Use  . Smoking status: Former Smoker    Quit date: 09/05/2003    Years since quitting: 15.5  . Smokeless tobacco: Never Used  Substance Use Topics  . Alcohol use: Not Currently  . Drug use: Not Currently     Family Hx: The patient's family history includes CAD in his brother.  ROS:   Please see the history of present illness.     All other systems reviewed and are negative.   Prior CV studies:   The following studies were reviewed today:  Cardiac cath [facility] 2016 Cath(EF 0.50, +1-+2 MR, Lt main- mild irreg, LAD - no significant dz, LCx- prox irreg, Ramus small vessel ostial 30%, OM1 B vessel ostial 30%, mid 30%, OM2 small vessel ostial 40%, OM3 small vessel,RCA - dominant; no significant dz; PDA C vessel 12-27-2014   10/2018 echo IMPRESSIONS    1. The left ventricle has severely reduced systolic function of 35-36%. The cavity size was normal. There is mild concentric left ventricular hypertrophy. Indeterminate diastolic function.  2. There is akinesis of the mid-apical anterior and anteroseptal left ventricular segments. Overall septal motion suggests left  bundle branch block.  3. The aortic valve is tricuspid There is mild aortic annular calcification noted.  4. The mitral valve is normal in structure. There is mild calcification.  5. The tricuspid valve is normal in structure.  6. The aortic root is normal in size and structure.  7. Right atrial size was mildly dilated.  8. The right ventricle has moderately reduced systolic function. The cavity was normal. There is no increase in right ventricular wall thickness. Right ventricular systolic pressure could not be assessed.  Labs/Other Tests and Data Reviewed:    EKG:  No ECG reviewed.  Recent Labs: 10/11/2018: B Natriuretic Peptide 499.0 10/12/2018: Magnesium 2.0 02/01/2019: ALT 15; BUN 17; Creatinine, Ser 1.42; Hemoglobin 16.6; Platelets 209; Potassium 5.5; Sodium 137   Recent Lipid Panel No results  found for: CHOL, TRIG, HDL, CHOLHDL, LDLCALC, LDLDIRECT  Wt Readings from Last 3 Encounters:  04/04/19 233 lb (105.7 kg)  02/01/19 234 lb (106.1 kg)  12/01/18 230 lb (104.3 kg)     Objective:    Vital Signs:  BP 128/82   Pulse 90   Ht 6\' 1"  (1.854 m)   Wt 233 lb (105.7 kg)   BMI 30.74 kg/m    Well nourished comfortable appearing male sitting in no distress. Normal affect. Normal speech pattern and tone. No visual or audible signs of SOB or wheezing  ASSESSMENT & PLAN:    1. Chronic systolic HF - repeat echo, if LVEF remains low work to continue adjusting meds likely changing to entresto and considering aldactone pending his labs (K was elevated during recent ER visit, also Cr was elevated). May also warrant ICD consideration pending LVEF   2. CAD - mild nonobstructive diease - no symptoms, continue current meds   3. Hyperkalemia -noted during ER visit in 01/2019,  repeat labs  COVID-19 Education: The signs and symptoms of COVID-19 were discussed with the patient and how to seek care for testing (follow up with PCP or arrange E-visit).  The importance of social distancing was  discussed today.  Time:   Today, I have spent 15 minutes with the patient with telehealth technology discussing the above problems.     Medication Adjustments/Labs and Tests Ordered: Current medicines are reviewed at length with the patient today.  Concerns regarding medicines are outlined above.   Tests Ordered: No orders of the defined types were placed in this encounter.   Medication Changes: No orders of the defined types were placed in this encounter.   Follow Up:  Pending echo results  Signed, Carlyle Dolly, MD  04/04/2019 10:30 AM    [place]

## 2019-04-04 NOTE — Patient Instructions (Signed)
Medication Instructions:  Your physician recommends that you continue on your current medications as directed. Please refer to the Current Medication list given to you today. If you need a refill on your cardiac medications before your next appointment, please call your pharmacy.   Lab work: Your physician recommends that you return for lab work in: The Day of your ECHO ( BMET, Mg)   If you have labs (blood work) drawn today and your tests are completely normal, you will receive your results only by: [name] MyChart Message (if you have MyChart) OR . A paper copy in the mail If you have any lab test that is abnormal or we need to change your treatment, we will call you to review the results.  Testing/Procedures: Your physician has requested that you have an echocardiogram. Echocardiography is a painless test that uses sound waves to create images of your heart. It provides your doctor with information about the size and shape of your heart and how well your heart's chambers and valves are working. This procedure takes approximately one hour. There are no restrictions for this procedure.    Follow-Up: At [facility], you and your health needs are our priority.  As part of our continuing mission to provide you with exceptional heart care, we have created designated Provider Care Teams.  These Care Teams include your primary Cardiologist (physician) and Advanced Practice Providers (APPs -  Physician Assistants and Nurse Practitioners) who all work together to provide you with the care you need, when you need it. You will need a follow up appointment Pending test results.  Please call our office 2 months in advance to schedule this appointment.  You may see Carlyle Dolly, MD or one of the following Advanced Practice Providers on your designated Care Team:   Bernerd Pho, PA-C [facility] . Ermalinda Barrios, PA-C ([facility])  Any Other Special Instructions Will Be Listed Below (If  Applicable). Thank you for choosing [facility]!

## 2019-04-04 NOTE — Addendum Note (Signed)
Addended by: Levonne Hubert on: 04/04/2019 11:20 AM   Modules accepted: Orders

## 2019-04-11 ENCOUNTER — Other Ambulatory Visit: Payer: Self-pay

## 2019-04-11 ENCOUNTER — Other Ambulatory Visit (HOSPITAL_COMMUNITY)
Admission: RE | Admit: 2019-04-11 | Discharge: 2019-04-11 | Disposition: A | Discharge status: Home / Self Care | Payer: Medicare Other | Source: Ambulatory Visit | Attending: Cardiology | Admitting: Cardiology

## 2019-04-11 ENCOUNTER — Ambulatory Visit (HOSPITAL_COMMUNITY)
Admission: RE | Admit: 2019-04-11 | Discharge: 2019-04-11 | Disposition: A | Discharge status: Home / Self Care | Payer: Medicare Other | Source: Ambulatory Visit | Attending: Cardiology | Admitting: Cardiology

## 2019-04-11 DIAGNOSIS — I5022 Chronic systolic (congestive) heart failure: Secondary | ICD-10-CM | POA: Diagnosis not present

## 2019-04-11 LAB — BASIC METABOLIC PANEL
Anion gap: 8 (ref 5–15)
BUN: 10 mg/dL (ref 8–23)
CO2: 26 mmol/L (ref 22–32)
Calcium: 9.4 mg/dL (ref 8.9–10.3)
Chloride: 105 mmol/L (ref 98–111)
Creatinine, Ser: 1.23 mg/dL (ref 0.61–1.24)
GFR calc Af Amer: >60 mL/min (ref >60)
GFR calc non Af Amer: 59 mL/min — ABNORMAL LOW (ref >60)
Glucose, Bld: 95 mg/dL (ref 70–99)
Potassium: 4.2 mmol/L (ref 3.5–5.1)
Sodium: 139 mmol/L (ref 135–145)

## 2019-04-11 LAB — MAGNESIUM: Magnesium: 2.1 mg/dL (ref 1.7–2.4)

## 2019-04-11 NOTE — Progress Notes (Signed)
*  PRELIMINARY RESULTS* Echocardiogram 2D Echocardiogram has been performed.  [name] 04/11/2019, 12:10 PM

## 2019-04-21 ENCOUNTER — Other Ambulatory Visit: Payer: Self-pay | Admitting: *Deleted

## 2019-04-21 ENCOUNTER — Telehealth: Payer: Self-pay | Admitting: *Deleted

## 2019-04-21 DIAGNOSIS — Z79899 Other long term (current) drug therapy: Secondary | ICD-10-CM

## 2019-04-21 MED ORDER — SACUBITRIL-VALSARTAN 49-51 MG PO TABS: 1.0000 | ORAL_TABLET | Freq: Two times a day (BID) | ORAL | 11 refills | Status: DC

## 2019-04-21 NOTE — Telephone Encounter (Signed)
Pt.notified

## 2019-04-21 NOTE — Telephone Encounter (Signed)
-----   Message from Arnoldo Lenis, MD sent at 04/18/2019 11:16 AM EDT ----- Heart function remains weak at 25-30%, we need to continue working with meds to improve it. Can he stop his losartan, start entresto 49/51mg  bid. BMET/Mg in 2 weeks, f/u with PA in 2 weeks.  J BrancH MD

## 2019-05-05 DIAGNOSIS — H60313 Diffuse otitis externa, bilateral: Secondary | ICD-10-CM | POA: Diagnosis not present

## 2019-05-05 DIAGNOSIS — H6503 Acute serous otitis media, bilateral: Secondary | ICD-10-CM | POA: Diagnosis not present

## 2019-05-05 DIAGNOSIS — H6122 Impacted cerumen, left ear: Secondary | ICD-10-CM | POA: Diagnosis not present

## 2019-05-10 ENCOUNTER — Ambulatory Visit (INDEPENDENT_AMBULATORY_CARE_PROVIDER_SITE_OTHER): Payer: Medicare HMO | Admitting: Pulmonary Disease

## 2019-05-10 ENCOUNTER — Telehealth: Payer: Self-pay | Admitting: Pulmonary Disease

## 2019-05-10 ENCOUNTER — Other Ambulatory Visit (HOSPITAL_COMMUNITY)
Admission: RE | Admit: 2019-05-10 | Discharge: 2019-05-10 | Disposition: A | Discharge status: Home / Self Care | Payer: Medicare HMO | Source: Ambulatory Visit | Attending: Cardiology | Admitting: Cardiology

## 2019-05-10 ENCOUNTER — Other Ambulatory Visit: Payer: Self-pay

## 2019-05-10 ENCOUNTER — Encounter: Payer: Self-pay | Admitting: Pulmonary Disease

## 2019-05-10 VITALS — BP 110/70 | HR 95 | Temp 97.5°F | Ht 73.0 in | Wt 228.0 lb

## 2019-05-10 DIAGNOSIS — Z66 Do not resuscitate: Secondary | ICD-10-CM

## 2019-05-10 DIAGNOSIS — J181 Lobar pneumonia, unspecified organism: Secondary | ICD-10-CM

## 2019-05-10 DIAGNOSIS — R590 Localized enlarged lymph nodes: Secondary | ICD-10-CM | POA: Insufficient documentation

## 2019-05-10 DIAGNOSIS — Z7901 Long term (current) use of anticoagulants: Secondary | ICD-10-CM | POA: Diagnosis not present

## 2019-05-10 DIAGNOSIS — J9611 Chronic respiratory failure with hypoxia: Secondary | ICD-10-CM | POA: Diagnosis not present

## 2019-05-10 DIAGNOSIS — Z79899 Other long term (current) drug therapy: Secondary | ICD-10-CM | POA: Diagnosis not present

## 2019-05-10 DIAGNOSIS — J189 Pneumonia, unspecified organism: Secondary | ICD-10-CM

## 2019-05-10 DIAGNOSIS — J90 Pleural effusion, not elsewhere classified: Secondary | ICD-10-CM | POA: Diagnosis not present

## 2019-05-10 DIAGNOSIS — Z85118 Personal history of other malignant neoplasm of bronchus and lung: Secondary | ICD-10-CM | POA: Diagnosis not present

## 2019-05-10 DIAGNOSIS — Z5181 Encounter for therapeutic drug level monitoring: Secondary | ICD-10-CM | POA: Diagnosis not present

## 2019-05-10 LAB — MAGNESIUM: Magnesium: 2 mg/dL (ref 1.7–2.4)

## 2019-05-10 LAB — BASIC METABOLIC PANEL
Anion gap: 7 (ref 5–15)
BUN: 15 mg/dL (ref 8–23)
CO2: 25 mmol/L (ref 22–32)
Calcium: 9.4 mg/dL (ref 8.9–10.3)
Chloride: 106 mmol/L (ref 98–111)
Creatinine, Ser: 1.14 mg/dL (ref 0.61–1.24)
GFR calc Af Amer: >60 mL/min (ref >60)
GFR calc non Af Amer: >60 mL/min (ref >60)
Glucose, Bld: 116 mg/dL — ABNORMAL HIGH (ref 70–99)
Potassium: 4.7 mmol/L (ref 3.5–5.1)
Sodium: 138 mmol/L (ref 135–145)

## 2019-05-10 MED ORDER — ALBUTEROL SULFATE (2.5 MG/3ML) 0.083% IN NEBU: 2.5000 mg | INHALATION_SOLUTION | RESPIRATORY_TRACT | 5 refills | Status: DC | PRN

## 2019-05-10 MED ORDER — ALBUTEROL SULFATE (2.5 MG/3ML) 0.083% IN NEBU: 2.5000 mg | INHALATION_SOLUTION | RESPIRATORY_TRACT | 5 refills | Status: DC

## 2019-05-10 MED ORDER — SPIRIVA RESPIMAT 2.5 MCG/ACT IN AERS: 2.0000 | INHALATION_SPRAY | Freq: Every day | RESPIRATORY_TRACT | 0 refills | Status: DC

## 2019-05-10 MED ORDER — AZITHROMYCIN 250 MG PO TABS: 250.0000 mg | ORAL_TABLET | ORAL | 3 refills | Status: AC

## 2019-05-10 NOTE — Patient Instructions (Addendum)
Thank you for visiting Dr. Valeta Harms at [facility]. Today we recommend the following: Orders Placed This Encounter  Procedures  . CT CHEST WO CONTRAST  . EKG 12-Lead   CT chest ordered for follow-up of mediastinal and hilar adenopathy seen on imaging in February 2020.  Meds ordered this encounter  Medications  . azithromycin (ZITHROMAX) 250 MG tablet    Sig: Take 1 tablet (250 mg total) by mouth every Monday, Wednesday, and Friday.    Dispense:  36 tablet    Refill:  3   Stop use of DuoNeb.  We will change to as needed albuterol nebulized solution. Add Spiriva Respimat 2.5, 2 puffs once daily.  We will show you how to use this today.  Continue use of Symbicort 160, 2 puffs twice daily  Continue azithromycin Monday Wednesday Friday, dose reduced to 250 mg.  Return in about 4 weeks (around 06/07/2019) for with APP or Dr. Valeta Harms.    Please do your part to reduce the spread of COVID-19.

## 2019-05-10 NOTE — Addendum Note (Signed)
Addended by: Vivia Ewing on: 05/10/2019 10:57 AM   Modules accepted: Orders

## 2019-05-10 NOTE — Progress Notes (Addendum)
Synopsis: Referred in September 2020 for recurrent pneumonia by [name], MD  Subjective:   PATIENT ID: [facility]. GENDER: male DOB: 1946-05-22, MRN: [id number]  Chief Complaint  Patient presents with  . Consult    Consult for chronic PNE. Last ED visit 02/10. CT 02/10. Currently using symbicort 160 daily and duoneb daily. Also uses therapy vest. He reports decreased ED visits since starting the vest. Reports taking Azithromycin M,W,F. Here to get established.      73 year old gentleman past medical history of congestive heart failure, EF 20 to 25%, diabetes, history of PE, hypertension, lung cancer (? S/p resection on left side, patient unsure if upper or lower). Last PFTS completed 2 years ago in [place]. Saw pulmonologist in [place] regularly. Currently on duonebs, symbiocort 160, Azithro MWF, vest therapy. We currently dont have records from [place] pulmonologist. We will need to request these.  He uses it some vest therapy regularly.  He does state that he had several hospitalizations in [place] with recurrent pneumonias.  After being placed on the vest therapy had significant improvement with his airway clearance.  Currently using duo nebs only 1-2 times per week.  Not on triple therapy at this time.  He did have a recent hospitalization.  Most recent ejection fraction was depressed.  We discussed goals of care today in the office as well.  He does have durable DNR forms completed at home.  We discussed the benefit utility of having a DNR medical bracelet or necklace tag to help ensure that his wishes are met.   Past Medical History:  Diagnosis Date  . Asthma   . Cancer ([place])   . CHF (congestive heart failure) ([place])    a. EF 45-50% by echo in 07/2017 b. EF reduced to 20-25% by repeat echo in 10/2018  . Coronary artery disease    a. cath in 2016 showing mild nonobstructive disease b. cath in 10/2018 showing nonobstructive CAD with 10% LM stenosis, 25% Proximal-LAD, 25% LCx, and mild  pulmonary HTN  . Diabetes mellitus without complication ([place])   . Gout   . History of pulmonary embolus (PE) 2016  . Hypertension   . Lung cancer ([place])   . Pneumonia      Family History  Problem Relation Age of Onset  . CAD Brother      Past Surgical History:  Procedure Laterality Date  . CERVICAL SPINE SURGERY    . RIGHT/LEFT HEART CATH AND CORONARY ANGIOGRAPHY N/A 11/08/2018   Procedure: RIGHT/LEFT HEART CATH AND CORONARY ANGIOGRAPHY;  Surgeon: [name], MD;  Location: [facility];  Service: Cardiovascular;  Laterality: N/A;    Social History   Socioeconomic History  . Marital status: Single    Spouse name: Not on file  . Number of children: Not on file  . Years of education: Not on file  . Highest education level: Not on file  Occupational History  . Not on file  Social Needs  . Financial resource strain: Not on file  . Food insecurity    Worry: Not on file    Inability: Not on file  . Transportation needs    Medical: Not on file    Non-medical: Not on file  Tobacco Use  . Smoking status: Former Smoker    Quit date: 09/05/2003    Years since quitting: 15.6  . Smokeless tobacco: Never Used  Substance and Sexual Activity  . Alcohol use: Not Currently  . Drug use: Not Currently  .  Sexual activity: Not on file  Lifestyle  . Physical activity    Days per week: Not on file    Minutes per session: Not on file  . Stress: Not on file  Relationships  . Social Herbalist on phone: Not on file    Gets together: Not on file    Attends religious service: Not on file    Active member of club or organization: Not on file    Attends meetings of clubs or organizations: Not on file    Relationship status: Not on file  . Intimate partner violence    Fear of current or ex partner: Not on file    Emotionally abused: Not on file    Physically abused: Not on file    Forced sexual activity: Not on file  Other Topics Concern  . Not on file  Social  History Narrative  . Not on file     No Known Allergies   Outpatient Medications Prior to Visit  Medication Sig Dispense Refill  . albuterol (PROVENTIL HFA;VENTOLIN HFA) 108 (90 Base) MCG/ACT inhaler Inhale 2 puffs into the lungs 2 (two) times a day.     . allopurinol (ZYLOPRIM) 300 MG tablet Take 300 mg by mouth daily.    [name] amitriptyline (ELAVIL) 50 MG tablet Take 50 mg by mouth 2 (two) times daily.    [name] atorvastatin (LIPITOR) 20 MG tablet Take 1 tablet (20 mg total) by mouth at bedtime. 90 tablet 3  . budesonide-formoterol (SYMBICORT) 160-4.5 MCG/ACT inhaler Inhale 2 puffs into the lungs 2 (two) times daily.    . carvedilol (COREG) 25 MG tablet Take 1 tablet (25 mg total) by mouth 2 (two) times daily. 180 tablet 3  . famotidine (PEPCID) 40 MG tablet Take 40 mg by mouth 2 (two) times daily.     . furosemide (LASIX) 20 MG tablet Take 1 tablet (20 mg total) by mouth daily as needed for fluid. 90 tablet 3  . hydrALAZINE (APRESOLINE) 50 MG tablet Take 1 tablet (50 mg total) by mouth 2 (two) times daily. 180 tablet 3  . HYDROcodone-acetaminophen (NORCO) 10-325 MG tablet Take 1 tablet by mouth every 6 (six) hours as needed for moderate pain. 12 tablet 0  . insulin aspart (NOVOLOG FLEXPEN) 100 UNIT/ML FlexPen Inject 25 Units into the skin 3 (three) times daily with meals. Plus personal sliding scale    . Insulin Detemir (LEVEMIR FLEXTOUCH) 100 UNIT/ML Pen Inject 45 Units into the skin at bedtime. 15 mL 0  . ipratropium-albuterol (DUONEB) 0.5-2.5 (3) MG/3ML SOLN Take 3 mLs by nebulization every 4 (four) hours as needed. (Patient taking differently: Take 3 mLs by nebulization every 4 (four) hours as needed (shortness of breath). ) 180 mL 0  . neomycin-polymyxin-hydrocortisone (CORTISPORIN) 3.5-10000-1 OTIC suspension Place 4 drops into the left ear 4 (four) times daily. X 7 days 10 mL 0  . nitroGLYCERIN (NITROSTAT) 0.4 MG SL tablet Place 1 tablet (0.4 mg total) under the tongue every 5 (five) minutes  as needed for chest pain. 25 tablet 2  . rivaroxaban (XARELTO) 20 MG TABS tablet Take 1 tablet (20 mg total) by mouth every morning. 90 tablet 3  . sacubitril-valsartan (ENTRESTO) 49-51 MG Take 1 tablet by mouth 2 (two) times daily. 60 tablet 11   No facility-administered medications prior to visit.     Review of Systems  Constitutional: Negative for chills, fever, malaise/fatigue and weight loss.  HENT: Negative for hearing loss, sore throat and  tinnitus.   Eyes: Negative for blurred vision and double vision.  Respiratory: Positive for cough, sputum production and shortness of breath. Negative for hemoptysis, wheezing and stridor.   Cardiovascular: Negative for chest pain, palpitations, orthopnea, leg swelling and PND.  Gastrointestinal: Negative for abdominal pain, constipation, diarrhea, heartburn, nausea and vomiting.  Genitourinary: Negative for dysuria, hematuria and urgency.  Musculoskeletal: Negative for joint pain and myalgias.  Skin: Negative for itching and rash.  Neurological: Negative for dizziness, tingling, weakness and headaches.  Endo/Heme/Allergies: Negative for environmental allergies. Does not bruise/bleed easily.  Psychiatric/Behavioral: Negative for depression. The patient is not nervous/anxious and does not have insomnia.   All other systems reviewed and are negative.    Objective:  Physical Exam Vitals signs reviewed.  Constitutional:      General: He is not in acute distress.    Appearance: He is well-developed.  HENT:     Head: Normocephalic and atraumatic.  Eyes:     General: No scleral icterus.    Conjunctiva/sclera: Conjunctivae normal.     Pupils: Pupils are equal, round, and reactive to light.  Neck:     Musculoskeletal: Neck supple.     Vascular: No JVD.     Trachea: No tracheal deviation.  Cardiovascular:     Rate and Rhythm: Normal rate and regular rhythm.     Heart sounds: Normal heart sounds. No murmur.  Pulmonary:     Effort: Pulmonary  effort is normal. No tachypnea, accessory muscle usage or respiratory distress.     Breath sounds: No stridor. No wheezing, rhonchi or rales.     Comments: Dimished BL breath sounds  Abdominal:     General: Bowel sounds are normal. There is no distension.     Palpations: Abdomen is soft.     Tenderness: There is no abdominal tenderness.  Musculoskeletal:        General: No tenderness.  Lymphadenopathy:     Cervical: No cervical adenopathy.  Skin:    General: Skin is warm and dry.     Capillary Refill: Capillary refill takes less than 2 seconds.     Findings: No rash.     Comments: Onychomycosis   Neurological:     Mental Status: He is alert and oriented to person, place, and time.  Psychiatric:        Behavior: Behavior normal.      Vitals:   05/10/19 0930  BP: 110/70  Pulse: 95  Temp: (!) 97.5 F (36.4 C)  TempSrc: Temporal  SpO2: 99%  Weight: 228 lb (103.4 kg)  Height: _0  (1.854 m)   99% on RA BMI Readings from Last 3 Encounters:  05/10/19 30.08 kg/m  04/04/19 30.74 kg/m  02/01/19 30.87 kg/m   Wt Readings from Last 3 Encounters:  05/10/19 228 lb (103.4 kg)  04/04/19 233 lb (105.7 kg)  02/01/19 234 lb (106.1 kg)     CBC    Component Value Date/Time   WBC 8.8 02/01/2019 1117   RBC 5.59 02/01/2019 1117   HGB 16.6 02/01/2019 1117   HCT 51.2 02/01/2019 1117   PLT 209 02/01/2019 1117   MCV 91.6 02/01/2019 1117   MCH 29.7 02/01/2019 1117   MCHC 32.4 02/01/2019 1117   RDW 14.0 02/01/2019 1117   LYMPHSABS 2.3 02/01/2019 1117   MONOABS 0.8 02/01/2019 1117   EOSABS 0.2 02/01/2019 1117   BASOSABS 0.0 02/01/2019 1117    Chest Imaging:  10/11/2018 CT chest: Left basilar airspace disease, small pleural effusion bilateral. Mediastinal  adenopathy, hilar adenopathy  Pulmonary Functions Testing Results: No flowsheet data found.  FeNO: None   Pathology:  ?locate previous lung cancer diagnosis   Echocardiogram: None   Heart Catheterization: None      Assessment & Plan:     ICD-10-CM   1. Community acquired pneumonia of left lower lobe of lung ([place])  J18.1   2. Pleural effusion, bilateral  J90   3. Chronic hypoxemic respiratory failure (HCC)  J96.11   4. H/O: lung cancer  Z85.118   5. Mediastinal adenopathy  R59.0   6. Hilar adenopathy  R59.0   7. On continuous oral anticoagulation  Z79.01   8. DNR (do not resuscitate)  Z66   9. Encounter for therapeutic drug monitoring  Z51.81 EKG 12-Lead    Discussion:  73 year old gentleman with a significant medical history to include congestive heart failure, chronic systolic heart failure history of PE history of lung cancer, history of hypoxemic respiratory failure and severe COPD.  Last pulmonary function tests were completed in [place] approximately 2 years ago per patient.  He is currently on extensive COPD regimen from prior pulmonologist to include vest therapy, Symbicort, DuoNebs, albuterol, azithromycin Monday Wednesday Friday. Plan: We will stop his duo nebs and dropped to just albuterol nebulized solution only Add Spiriva Respimat 2.5, 2 puffs once daily Continue Symbicort 160, 2 puffs twice daily Continue azithromycin Monday Wednesday Friday.  He is currently on 500 mg, we can consider de-escalation to 250 mg. We will obtain EKG today in the office.  Last EKG with QTC of 525 with LBBB  ECG today 477 with LBBB today in office   Continue vest therapy.  CT chest ordered for follow-up of mediastinal and hilar adenopathy also history of lung cancer.  Initial imaging was in February 2020.  Patient to return to clinic in 4 weeks to discuss CT image results.  And to see if new Spiriva regimen and dose reduction of azithromycin has made any difference in symptomatology.  Greater than 50% of this patient's 65-minute of visit was been face-to-face discussing above recommendations and treatment plan as reviewing history and imaging and documentation previous hospitalization.   Current  Outpatient Medications:  .  albuterol (PROVENTIL HFA;VENTOLIN HFA) 108 (90 Base) MCG/ACT inhaler, Inhale 2 puffs into the lungs 2 (two) times a day. , Disp: , Rfl:  .  allopurinol (ZYLOPRIM) 300 MG tablet, Take 300 mg by mouth daily., Disp: , Rfl:  .  amitriptyline (ELAVIL) 50 MG tablet, Take 50 mg by mouth 2 (two) times daily., Disp: , Rfl:  .  atorvastatin (LIPITOR) 20 MG tablet, Take 1 tablet (20 mg total) by mouth at bedtime., Disp: 90 tablet, Rfl: 3 .  budesonide-formoterol (SYMBICORT) 160-4.5 MCG/ACT inhaler, Inhale 2 puffs into the lungs 2 (two) times daily., Disp: , Rfl:  .  carvedilol (COREG) 25 MG tablet, Take 1 tablet (25 mg total) by mouth 2 (two) times daily., Disp: 180 tablet, Rfl: 3 .  famotidine (PEPCID) 40 MG tablet, Take 40 mg by mouth 2 (two) times daily. , Disp: , Rfl:  .  furosemide (LASIX) 20 MG tablet, Take 1 tablet (20 mg total) by mouth daily as needed for fluid., Disp: 90 tablet, Rfl: 3 .  hydrALAZINE (APRESOLINE) 50 MG tablet, Take 1 tablet (50 mg total) by mouth 2 (two) times daily., Disp: 180 tablet, Rfl: 3 .  HYDROcodone-acetaminophen (NORCO) 10-325 MG tablet, Take 1 tablet by mouth every 6 (six) hours as needed for moderate pain., Disp: 12 tablet,  Rfl: 0 .  insulin aspart (NOVOLOG FLEXPEN) 100 UNIT/ML FlexPen, Inject 25 Units into the skin 3 (three) times daily with meals. Plus personal sliding scale, Disp: , Rfl:  .  Insulin Detemir (LEVEMIR FLEXTOUCH) 100 UNIT/ML Pen, Inject 45 Units into the skin at bedtime., Disp: 15 mL, Rfl: 0 .  ipratropium-albuterol (DUONEB) 0.5-2.5 (3) MG/3ML SOLN, Take 3 mLs by nebulization every 4 (four) hours as needed. (Patient taking differently: Take 3 mLs by nebulization every 4 (four) hours as needed (shortness of breath). ), Disp: 180 mL, Rfl: 0 .  neomycin-polymyxin-hydrocortisone (CORTISPORIN) 3.5-10000-1 OTIC suspension, Place 4 drops into the left ear 4 (four) times daily. X 7 days, Disp: 10 mL, Rfl: 0 .  nitroGLYCERIN (NITROSTAT)  0.4 MG SL tablet, Place 1 tablet (0.4 mg total) under the tongue every 5 (five) minutes as needed for chest pain., Disp: 25 tablet, Rfl: 2 .  rivaroxaban (XARELTO) 20 MG TABS tablet, Take 1 tablet (20 mg total) by mouth every morning., Disp: 90 tablet, Rfl: 3 .  sacubitril-valsartan (ENTRESTO) 49-51 MG, Take 1 tablet by mouth 2 (two) times daily., Disp: 60 tablet, Rfl: [place], DO [facility] 05/10/2019 9:47 AM    PCCM Addendum:   Patient has been on several inhalers in the past.  Due to inhaler costs and working with the patient's insurance he would benefit from switching to nebulized solutions to include Pulmicort and Brovana if able.  [facility] 05/18/2019 5:20 PM

## 2019-05-10 NOTE — Progress Notes (Signed)
Patient seen in the office today and instructed on use of Spiriva Respimat 2.5.  Patient expressed understanding and demonstrated technique.

## 2019-05-10 NOTE — Telephone Encounter (Signed)
I called [facility] and the albuterol Rx has to be changed to have the as needed on the directions omitted for Medicare guidelines. I resent the Rx to [place] and nothing further is needed.

## 2019-05-11 ENCOUNTER — Telehealth: Payer: Self-pay | Admitting: Pulmonary Disease

## 2019-05-11 NOTE — Progress Notes (Signed)
Cardiology Office Note   Date:  05/11/2019   ID:  Roger China., DOB December 22, 1945, MRN [id number]  PCP:  [name], MD  Cardiologist:  Dr. Harl Bowie     No chief complaint on file.     History of Present Illness: Roger Lowe. is a 73 y.o. male who presents for    1. CAD - previously followed a [facility] - 12/2017 LVEF 45% by there records, chronic LBBB - 2016 cath [facility]: nonobstructive disease - 10/2018 cath Roger Lowe: nonobstructive disease  - denies chest pain, no SOB   2. Chronic systolic HF/NICM  - new diagnosis of sysotlic HF 12/979 - 09/9145 echo LVEF 20-25%. - 10/2018 cath: nonobstructive CAD. Mean PA 33, PCWP 31, CI 2.6  repeat echo with EF 25-30%  Stop losartan and entresto 49/51 started now back for follow up.  Repeat labs were good  Cr 1.14 k+ 4.7 -feels better, more energy and less SOB.  sleeps on 1 pillow occ wakes SOB but is able to go back to sleep  No palpitations no dizziness   3. History of DVT - on xarelto in definite, no bleeding   4. COPD - on 2L [place] at home per pulmonary, on chronic antibiotics.      Past Medical History:  Diagnosis Date  . Asthma   . Cancer ([place])   . CHF (congestive heart failure) ([place])    a. EF 45-50% by echo in 07/2017 b. EF reduced to 20-25% by repeat echo in 10/2018  . Coronary artery disease    a. cath in 2016 showing mild nonobstructive disease b. cath in 10/2018 showing nonobstructive CAD with 10% LM stenosis, 25% Proximal-LAD, 25% LCx, and mild pulmonary HTN  . Diabetes mellitus without complication ([name])   . Gout   . History of pulmonary embolus (PE) 2016  . Hypertension   . Lung cancer ([place])   . Pneumonia     Past Surgical History:  Procedure Laterality Date  . CERVICAL SPINE SURGERY    . RIGHT/LEFT HEART CATH AND CORONARY ANGIOGRAPHY N/A 11/08/2018   Procedure: RIGHT/LEFT HEART CATH AND CORONARY ANGIOGRAPHY;  Surgeon: [name], MD;  Location: [facility];  Service:  Cardiovascular;  Laterality: N/A;     Current Outpatient Medications  Medication Sig Dispense Refill  . albuterol (PROVENTIL HFA;VENTOLIN HFA) 108 (90 Base) MCG/ACT inhaler Inhale 2 puffs into the lungs 2 (two) times a day.     . albuterol (PROVENTIL) (2.5 MG/3ML) 0.083% nebulizer solution Take 3 mLs (2.5 mg total) by nebulization every 4 (four) hours. 75 mL 5  . allopurinol (ZYLOPRIM) 300 MG tablet Take 300 mg by mouth daily.    [name] amitriptyline (ELAVIL) 50 MG tablet Take 50 mg by mouth 2 (two) times daily.    [name] atorvastatin (LIPITOR) 20 MG tablet Take 1 tablet (20 mg total) by mouth at bedtime. 90 tablet 3  . azithromycin (ZITHROMAX) 250 MG tablet Take 1 tablet (250 mg total) by mouth every Monday, Wednesday, and Friday. 36 tablet 3  . budesonide-formoterol (SYMBICORT) 160-4.5 MCG/ACT inhaler Inhale 2 puffs into the lungs 2 (two) times daily.    . carvedilol (COREG) 25 MG tablet Take 1 tablet (25 mg total) by mouth 2 (two) times daily. 180 tablet 3  . famotidine (PEPCID) 40 MG tablet Take 40 mg by mouth 2 (two) times daily.     . furosemide (LASIX) 20 MG tablet Take 1 tablet (20 mg total) by mouth daily  as needed for fluid. 90 tablet 3  . hydrALAZINE (APRESOLINE) 50 MG tablet Take 1 tablet (50 mg total) by mouth 2 (two) times daily. 180 tablet 3  . HYDROcodone-acetaminophen (NORCO) 10-325 MG tablet Take 1 tablet by mouth every 6 (six) hours as needed for moderate pain. 12 tablet 0  . insulin aspart (NOVOLOG FLEXPEN) 100 UNIT/ML FlexPen Inject 25 Units into the skin 3 (three) times daily with meals. Plus personal sliding scale    . Insulin Detemir (LEVEMIR FLEXTOUCH) 100 UNIT/ML Pen Inject 45 Units into the skin at bedtime. 15 mL 0  . ipratropium-albuterol (DUONEB) 0.5-2.5 (3) MG/3ML SOLN Take 3 mLs by nebulization every 4 (four) hours as needed. (Patient taking differently: Take 3 mLs by nebulization every 4 (four) hours as needed (shortness of breath). ) 180 mL 0  .  neomycin-polymyxin-hydrocortisone (CORTISPORIN) 3.5-10000-1 OTIC suspension Place 4 drops into the left ear 4 (four) times daily. X 7 days 10 mL 0  . nitroGLYCERIN (NITROSTAT) 0.4 MG SL tablet Place 1 tablet (0.4 mg total) under the tongue every 5 (five) minutes as needed for chest pain. 25 tablet 2  . Roger Lowe (XARELTO) 20 MG TABS tablet Take 1 tablet (20 mg total) by mouth every morning. 90 tablet 3  . sacubitril-valsartan (ENTRESTO) 49-51 MG Take 1 tablet by mouth 2 (two) times daily. 60 tablet 11  . Tiotropium Bromide Monohydrate (SPIRIVA RESPIMAT) 2.5 MCG/ACT AERS Inhale 2 puffs into the lungs daily. 1 g 0   No current facility-administered medications for this visit.     Allergies:   Patient has no known allergies.    Social History:  The patient  reports that he quit smoking about 15 years ago. He has never used smokeless tobacco. He reports previous alcohol use. He reports previous drug use.   Family History:  The patient's family history includes CAD in his brother.    ROS:  General:no colds or fevers, no weight changes Skin:no rashes or ulcers HEENT:no blurred vision, no congestion CV:see HPI PUL:see HPI hx of lung cancer GI:no diarrhea constipation or melena, no indigestion GU:no hematuria, no dysuria MS:no joint pain, no claudication Neuro:no syncope, no lightheadedness Endo:+ diabetes, no thyroid disease  Wt Readings from Last 3 Encounters:  05/10/19 228 lb (103.4 kg)  04/04/19 233 lb (105.7 kg)  02/01/19 234 lb (106.1 kg)     PHYSICAL EXAM: VS:  There were no vitals taken for this visit. , BMI There is no height or weight on file to calculate BMI. General:Pleasant affect, NAD Skin:Warm and dry, brisk capillary refill HEENT:normocephalic, sclera clear, mucus membranes moist Neck:supple, no JVD, no bruits sitting up Heart:S1S2 RRR without murmur, gallup, rub or click Lungs:clear without rales, +rhonchi, no wheezes NID:POEU, non tender, + BS, do not palpate  liver spleen or masses Ext:no lower ext edema, 2+ pedal pulses, 2+ radial pulses Neuro:alert and oriented X 3, MAE, follows commands, + facial symmetry    EKG:  EKG is NOT ordered today.   Recent Labs: 10/11/2018: B Natriuretic Peptide 499.0 02/01/2019: ALT 15; Hemoglobin 16.6; Platelets 209 05/10/2019: BUN 15; Creatinine, Ser 1.14; Magnesium 2.0; Potassium 4.7; Sodium 138    Lipid Panel No results found for: CHOL, TRIG, HDL, CHOLHDL, VLDL, LDLCALC, LDLDIRECT     Other studies Reviewed: Additional studies/ records that were reviewed today include: .  Echo 04/11/19   IMPRESSIONS    1. Stage 1: 1: Multiple segmental abnormalities exist. See findings.  2. The left ventricle has severely reduced systolic function, with  an ejection fraction of 25-30%. The cavity size was normal. There is mild concentric left ventricular hypertrophy. Left ventricular diastolic Doppler parameters are indeterminate.  Elevated left ventricular end-diastolic pressure Left ventricular diffuse hypokinesis.  3. The right ventricle has mildly reduced systolic function. The cavity was normal. There is mildly increased right ventricular wall thickness.  4. The aortic valve is tricuspid. Mild aortic annular calcification noted.  5. The mitral valve is degenerative. Mild thickening of the mitral valve leaflet. Mild calcification of the mitral valve leaflet. Mitral valve regurgitation is mild to moderate by color flow Doppler.  6. The tricuspid valve is grossly normal.  7. The aorta is normal in size and structure.  FINDINGS  Left Ventricle: The left ventricle has severely reduced systolic function, with an ejection fraction of 25-30%. The cavity size was normal. There is mild concentric left ventricular hypertrophy. Left ventricular diastolic Doppler parameters are  indeterminate. Elevated left ventricular end-diastolic pressure Left ventricular diffuse hypokinesis.  LV Wall Scoring: The apical septal segment and  apex are akinetic. The basal anteroseptal segment, basal inferoseptal segment, and basal anterior segment are hypokinetic.   Right Ventricle: The right ventricle has mildly reduced systolic function. The cavity was normal. There is mildly increased right ventricular wall thickness.  Left Atrium: Left atrial size was normal in size.  Right Atrium: Right atrial size was normal in size. Right atrial pressure is estimated at 10 mmHg.  Interatrial Septum: No atrial level shunt detected by color flow Doppler.  Pericardium: There is no evidence of pericardial effusion.  Mitral Valve: The mitral valve is degenerative in appearance. Mild thickening of the mitral valve leaflet. Mild calcification of the mitral valve leaflet. Mitral valve regurgitation is mild to moderate by color flow Doppler.  Tricuspid Valve: The tricuspid valve is grossly normal. Tricuspid valve regurgitation is mild by color flow Doppler.  Aortic Valve: The aortic valve is tricuspid Aortic valve regurgitation was not visualized by color flow Doppler. There is no evidence of aortic valve stenosis. Mild aortic annular calcification noted.  Pulmonic Valve: The pulmonic valve was grossly normal. Pulmonic valve regurgitation is trivial by color flow Doppler.  Aorta: The aorta is normal in size and structure.  Venous: The inferior vena cava is normal in size with greater than 50% respiratory variability.   Cardiac cath 11/08/18   Mid LM lesion is 10% stenosed.  Prox Cx to Mid Cx lesion is 25% stenosed.  Prox LAD lesion is 25% stenosed.  There is moderate left ventricular systolic dysfunction.  There is no aortic valve stenosis.  Hemodynamic findings consistent with mild pulmonary hypertension.  Ao sat 98%, PA sat 72%, PA mean 33 mm Hg, PCWP mean 31 mm Hg; CO 5.99 L/min; CI 2.63   Nonobstructive CAD.  Continue medical therapy for nonischemic cardiomyopathy.    ASSESSMENT AND PLAN:  1.  Chronic  systolic HF and NICM  Will increase entresto to 91/103 mg today and recheck labs in 2 weeks and follow up in 2-3 weeks.  Will recheck echo in a couple of months to re-eval EF  2.  Non obstructive CAD no angina continue meds on statin  3.  Diabetes per PCP  4.  COPD on home 02, freq episodes of PNA followed by pulmonary.       Current medicines are reviewed with the patient today.  The patient Has no concerns regarding medicines.  The following changes have been made:  See above Labs/ tests ordered today include:see above  Disposition:  FU:  see above  Signed, Cecilie Kicks, NP  05/11/2019 12:06 PM    [address], [address] [place], [place] Phone: [phone]; Fax: [phone]

## 2019-05-11 NOTE — Telephone Encounter (Signed)
Called and spoke with Duwaine Maxin.  Caryl Pina stated Albuterol neb prescription needed to have a a larger ml dispensed for month worth of medication, current prescription was written for 36ml, and needed in directions every 4 hours and as needed,written completely out  for [facility] to cover cost. Caryl Pina stated prn will not work for [employer]  Caryl Pina stated Patent was on Symbicort and would benefit cost wise, with budesonide and Perforomist. Caryl Pina stated Patient had Medicare [facility]) and Medicaid, so there would be no further cost to Patient.  Will route message to Dr Valeta Harms to advise

## 2019-05-11 NOTE — Telephone Encounter (Signed)
PCCM:  Ok to increase dispense to cover 90 day supply of albuterol.   Ok to switch from symbicort to pulmicort/brovana combo if cheaper to patient  Garner Nash, [facility] 05/11/2019 3:26 PM

## 2019-05-12 ENCOUNTER — Ambulatory Visit (INDEPENDENT_AMBULATORY_CARE_PROVIDER_SITE_OTHER): Payer: Medicare HMO | Admitting: Cardiology

## 2019-05-12 ENCOUNTER — Other Ambulatory Visit: Payer: Self-pay

## 2019-05-12 ENCOUNTER — Encounter: Payer: Self-pay | Admitting: Cardiology

## 2019-05-12 VITALS — BP 134/76 | HR 94 | Temp 97.9°F | Ht 73.0 in | Wt 228.0 lb

## 2019-05-12 DIAGNOSIS — I5022 Chronic systolic (congestive) heart failure: Secondary | ICD-10-CM

## 2019-05-12 DIAGNOSIS — I429 Cardiomyopathy, unspecified: Secondary | ICD-10-CM | POA: Diagnosis not present

## 2019-05-12 DIAGNOSIS — I2510 Atherosclerotic heart disease of native coronary artery without angina pectoris: Secondary | ICD-10-CM

## 2019-05-12 DIAGNOSIS — J9611 Chronic respiratory failure with hypoxia: Secondary | ICD-10-CM | POA: Diagnosis not present

## 2019-05-12 MED ORDER — ENTRESTO 97-103 MG PO TABS: 1.0000 | ORAL_TABLET | Freq: Two times a day (BID) | ORAL | 9 refills | Status: DC

## 2019-05-12 MED ORDER — ALBUTEROL SULFATE (2.5 MG/3ML) 0.083% IN NEBU: 2.5000 mg | INHALATION_SOLUTION | RESPIRATORY_TRACT | 1 refills | Status: DC

## 2019-05-12 NOTE — Patient Instructions (Signed)
Medication Instructions: INCREASE Entresto to 97/103 mg twice a day     Labwork: Bmet, magnesium due in 2 weeks ( 05/26/19)  Procedures/Testing: None  Follow-Up: 3 weeks with B.Strader PA-C  Any Additional Special Instructions Will Be Listed Below (If Applicable).     If you need a refill on your cardiac medications before your next appointment, please call your pharmacy.     Thank you for choosing [facility] !

## 2019-05-12 NOTE — Telephone Encounter (Signed)
PCCM: Agreed. Okay to make appropriate changes for coverage Garner Nash, DO [facility] 05/12/2019 11:11 AM

## 2019-05-12 NOTE — Telephone Encounter (Signed)
Spoke with Ashly while discussing another patient. He stated that he was waiting on another phone call in regards to this patient. He stated that Medicare will cover the albuterol as long as the "and prn" was added back to the RX. Also, the quantity needed to be increased because the original amount would not be enough for the patient. Advised him that I would fix the RX for him.   I also asked him about switching from Symbicort to Pulmicort/Brovana. He stated that the patient has dual coverage and he would be able to get this medications for free. These orders will need to be sent to [facility] via Epic. Advised him I would send in the orders as soon as the patient was made aware.    Left message for patient to call back.   Will send this encounter back to Dr. Valeta Harms as a FYI.

## 2019-05-12 NOTE — Telephone Encounter (Signed)
ATC pt, no answer. Left message for pt to call back.  

## 2019-05-13 ENCOUNTER — Telehealth: Payer: Self-pay | Admitting: Pulmonary Disease

## 2019-05-13 MED ORDER — ARFORMOTEROL TARTRATE 15 MCG/2ML IN NEBU: 15.0000 ug | INHALATION_SOLUTION | Freq: Two times a day (BID) | RESPIRATORY_TRACT | 6 refills | Status: DC

## 2019-05-13 MED ORDER — BUDESONIDE 0.5 MG/2ML IN SUSP: 0.5000 mg | Freq: Two times a day (BID) | RESPIRATORY_TRACT | 12 refills | Status: DC

## 2019-05-13 MED ORDER — ALBUTEROL SULFATE (2.5 MG/3ML) 0.083% IN NEBU: 2.5000 mg | INHALATION_SOLUTION | RESPIRATORY_TRACT | 1 refills | Status: DC

## 2019-05-13 NOTE — Telephone Encounter (Signed)
All prescriptions have been sent to [facility] per Dr. Valeta Harms.  Spoke with pt. He is aware of the medication changes and comfortable with the change. Nothing further was needed at this time.

## 2019-05-13 NOTE — Telephone Encounter (Signed)
Dr. Valeta Harms can you addend note to show that patient is taking Brovana and budesonide and recommended to continue these medications so his insurance can pay for his medications? Please advise.

## 2019-05-13 NOTE — Telephone Encounter (Signed)
Pt is returning call. Cb is [phone]

## 2019-05-15 DIAGNOSIS — J9601 Acute respiratory failure with hypoxia: Secondary | ICD-10-CM | POA: Diagnosis not present

## 2019-05-15 DIAGNOSIS — I5022 Chronic systolic (congestive) heart failure: Secondary | ICD-10-CM | POA: Diagnosis not present

## 2019-05-15 DIAGNOSIS — I5032 Chronic diastolic (congestive) heart failure: Secondary | ICD-10-CM | POA: Diagnosis not present

## 2019-05-18 ENCOUNTER — Telehealth: Payer: Self-pay | Admitting: Pulmonary Disease

## 2019-05-18 NOTE — Telephone Encounter (Signed)
Dr. Valeta Harms - please advise. Thanks!

## 2019-05-18 NOTE — Telephone Encounter (Signed)
In error

## 2019-05-18 NOTE — Telephone Encounter (Signed)
Margaret from [employer] is calling back regarding notes for medication. CB is [phone] opt 3. Fax notes to [phone]

## 2019-05-18 NOTE — Telephone Encounter (Signed)
Still awaiting Dr Valeta Harms to addend note  The number that was provided to call is incorrect  Will forward back to Dr Valeta Harms

## 2019-05-18 NOTE — Telephone Encounter (Signed)
PCCM:  Addendum complete  Garner Nash, DO [facility] 05/18/2019 5:21 PM

## 2019-05-19 NOTE — Telephone Encounter (Signed)
Will fax notes to the number given below.

## 2019-05-23 ENCOUNTER — Ambulatory Visit (HOSPITAL_COMMUNITY)
Admission: RE | Admit: 2019-05-23 | Discharge: 2019-05-23 | Disposition: A | Discharge status: Home / Self Care | Payer: Medicare HMO | Source: Ambulatory Visit | Attending: Pulmonary Disease | Admitting: Pulmonary Disease

## 2019-05-23 ENCOUNTER — Other Ambulatory Visit: Payer: Self-pay

## 2019-05-23 ENCOUNTER — Telehealth: Payer: Self-pay

## 2019-05-23 DIAGNOSIS — J189 Pneumonia, unspecified organism: Secondary | ICD-10-CM

## 2019-05-23 DIAGNOSIS — Z85118 Personal history of other malignant neoplasm of bronchus and lung: Secondary | ICD-10-CM | POA: Insufficient documentation

## 2019-05-23 DIAGNOSIS — J984 Other disorders of lung: Secondary | ICD-10-CM | POA: Diagnosis not present

## 2019-05-23 DIAGNOSIS — J181 Lobar pneumonia, unspecified organism: Secondary | ICD-10-CM | POA: Diagnosis not present

## 2019-05-23 DIAGNOSIS — R590 Localized enlarged lymph nodes: Secondary | ICD-10-CM | POA: Diagnosis not present

## 2019-05-23 DIAGNOSIS — J449 Chronic obstructive pulmonary disease, unspecified: Secondary | ICD-10-CM | POA: Diagnosis not present

## 2019-05-23 IMAGING — CT CT CHEST W/O CM
2 of 4 series · 15 of 36 positions shown, 18 images · non-contrast
Comparison: CT chest [DATE]. Single-view of the chest
[DATE].

CLINICAL DATA: History of mediastinal lymphadenopathy and recurrent
pneumonia.

EXAM:
CT CHEST WITHOUT CONTRAST
TECHNIQUE: Multidetector CT imaging of the chest was performed following the
standard protocol without IV contrast.

[Series 2: routine chest without · axial · non-contrast · 0.66mm/px · z∈[-261,+11]mm · 12 of 160 slices shown, 15 images]
[im 12/160  mediastinal]
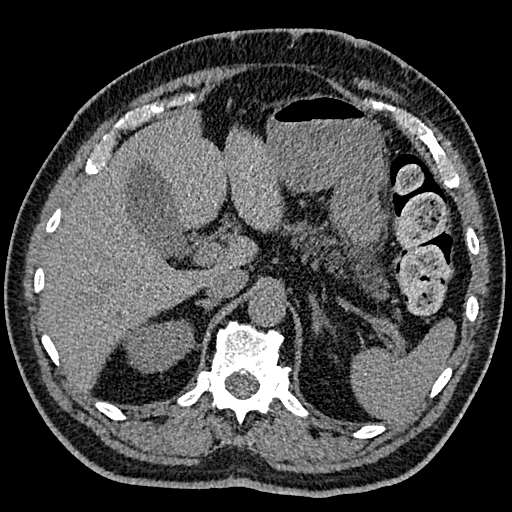
[im 12/160  lung]
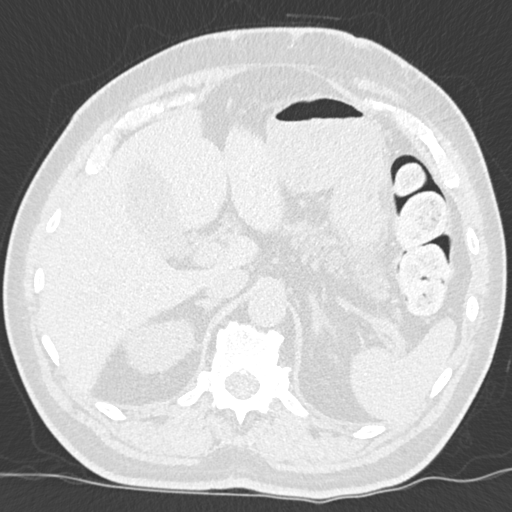
[im 23/160  lung]
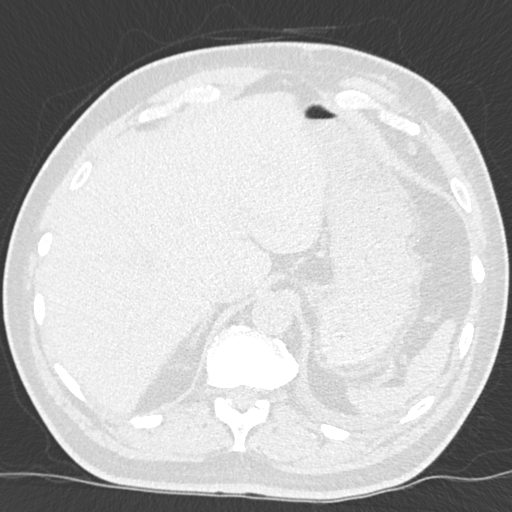
[im 35/160  lung]
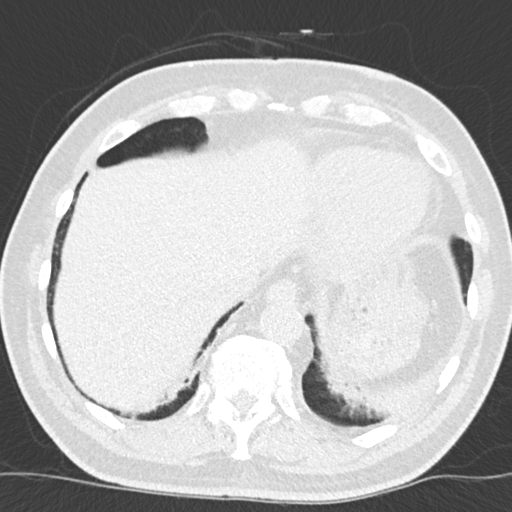
[im 46/160  lung]
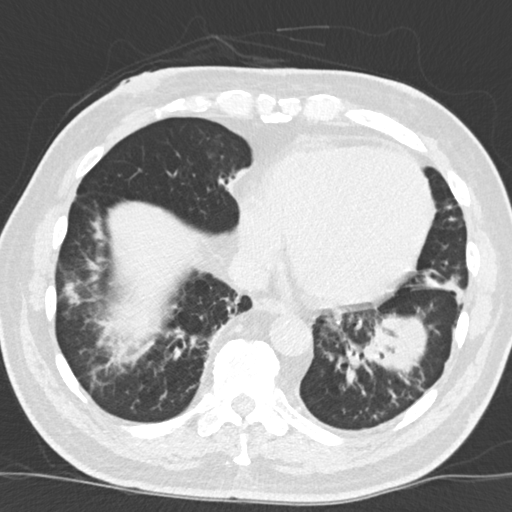
[im 57/160  mediastinal]
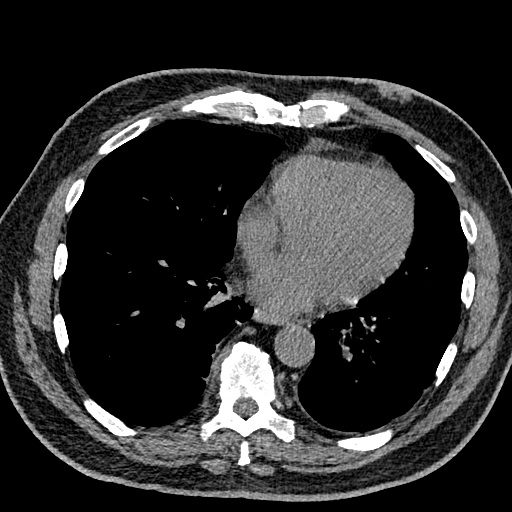
[im 57/160  lung]
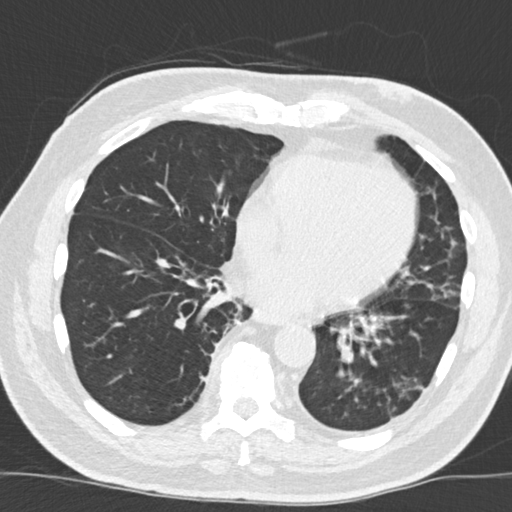
[im 69/160  lung]
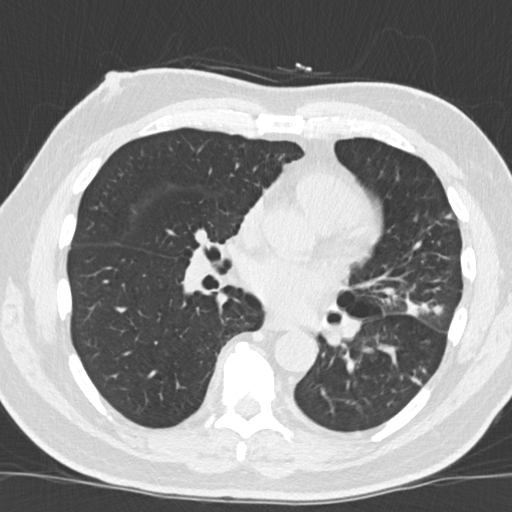
[im 91/160  lung]
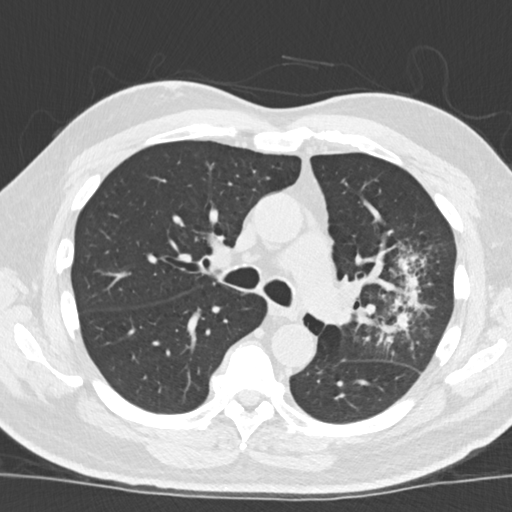
[im 103/160  lung]
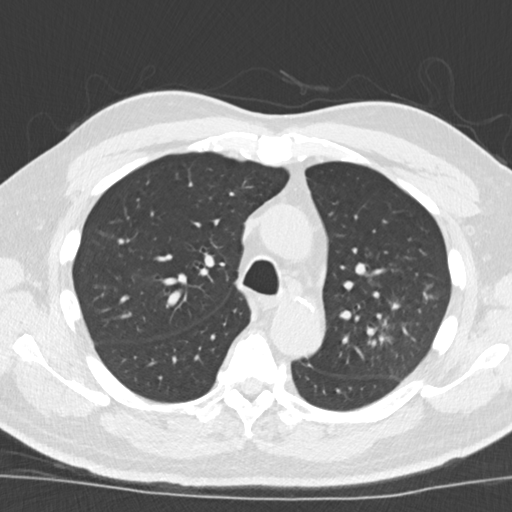
[im 114/160  mediastinal]
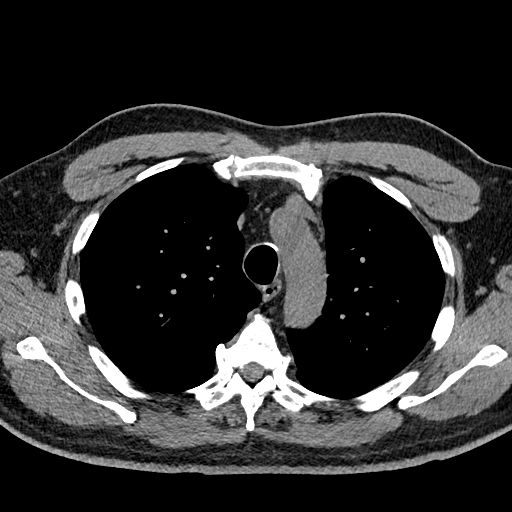
[im 114/160  lung]
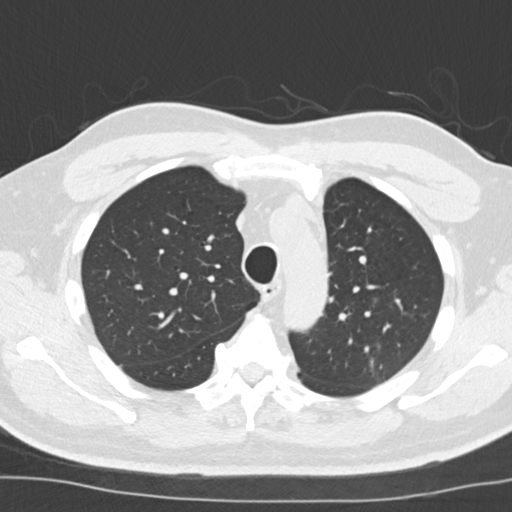
[im 125/160  lung]
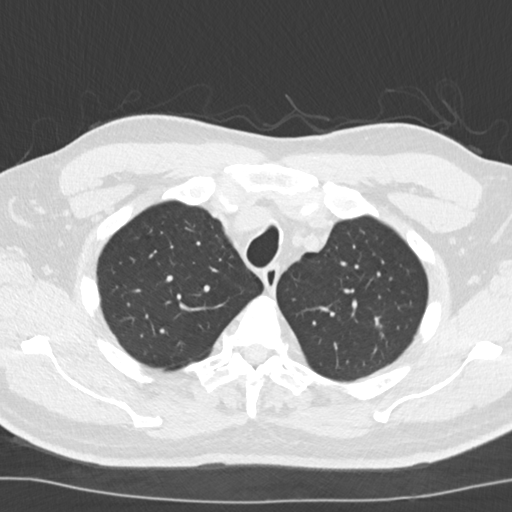
[im 137/160  lung]
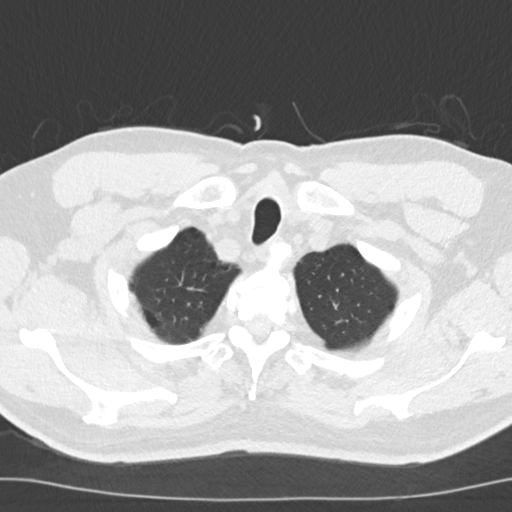
[im 148/160  lung]
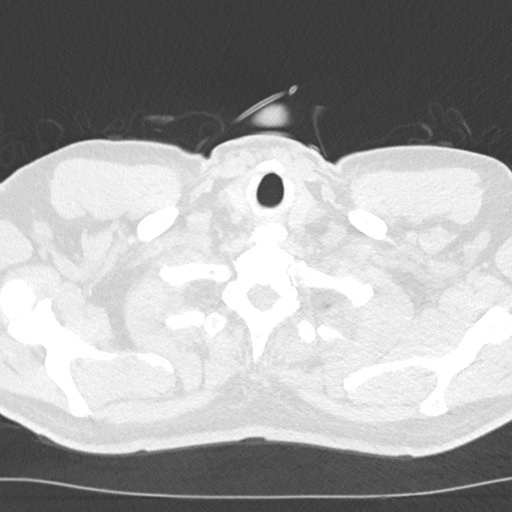

[Series 6: coronal · coronal · 0.64mm/px · 3 of 139 slices shown]
[im 28/139  lung]
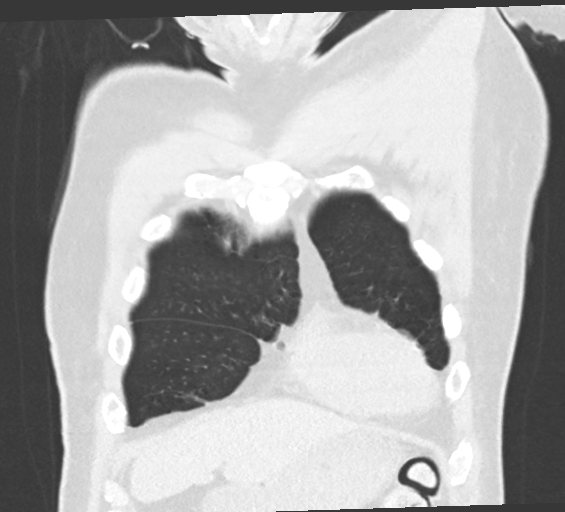
[im 56/139  lung]
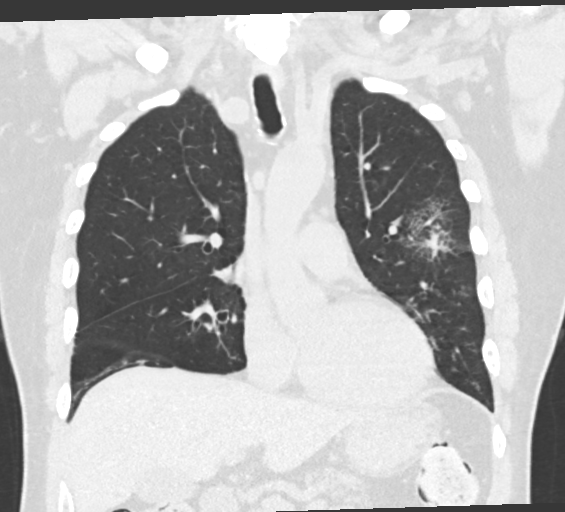
[im 83/139  lung]
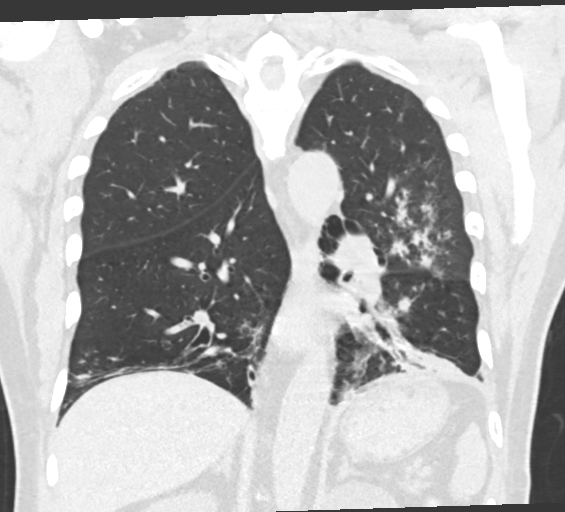

[15 of 36 positions shown; findings below may reference images not displayed]

FINDINGS: Cardiovascular: No significant vascular findings. Normal heart size.
No pericardial effusion. Calcific aortic atherosclerosis noted.

Mediastinum/Nodes: Previously seen 1.2 cm AP window lymph node has
decreased in size to 0.8 cm on image 59 of series 2. No
pathologically enlarged lymph nodes are seen in the axilla, hila or
mediastinum.

Lungs/Pleura: No pleural effusion. There is new airspace disease in
the left upper lobe and superior segment of the left lower lobe.
There is some bronchiectasis in the lung bases. Small focus of
airspace opacity in left lung base is unchanged. There is mild
dependent atelectasis or scarring in the periphery of the right lung
base.

Upper Abdomen: Cyst in the upper pole of the left kidney is
identified.

Musculoskeletal: No fracture or worrisome bony lesion.
IMPRESSION: Airspace disease in the left upper lobe and superior segment of the
left lower lobe worrisome for pneumonia.

Negative for lymphadenopathy. Small AP window node on the prior
examination has decreased in size.

Aortic Atherosclerosis ([7Y]-[7Y]).

## 2019-05-23 NOTE — Telephone Encounter (Signed)
Call returned to [facility], placed on long hold. Updates OV notes printed and faxed. Will try back later.

## 2019-05-24 NOTE — Telephone Encounter (Signed)
Estill Bamberg called back - she received fax - nothing further needed -pr

## 2019-05-24 NOTE — Telephone Encounter (Signed)
I called Roger Lowe to see if she received the fax and she was unavailable. LM to call me back.

## 2019-05-28 ENCOUNTER — Other Ambulatory Visit: Payer: Self-pay

## 2019-05-30 DIAGNOSIS — G43009 Migraine without aura, not intractable, without status migrainosus: Secondary | ICD-10-CM | POA: Diagnosis not present

## 2019-05-30 DIAGNOSIS — E782 Mixed hyperlipidemia: Secondary | ICD-10-CM | POA: Diagnosis not present

## 2019-05-30 DIAGNOSIS — E1122 Type 2 diabetes mellitus with diabetic chronic kidney disease: Secondary | ICD-10-CM | POA: Diagnosis not present

## 2019-05-30 DIAGNOSIS — I10 Essential (primary) hypertension: Secondary | ICD-10-CM | POA: Diagnosis not present

## 2019-05-30 DIAGNOSIS — Z125 Encounter for screening for malignant neoplasm of prostate: Secondary | ICD-10-CM | POA: Diagnosis not present

## 2019-05-30 DIAGNOSIS — E1165 Type 2 diabetes mellitus with hyperglycemia: Secondary | ICD-10-CM | POA: Diagnosis not present

## 2019-05-30 MED ORDER — SPIRIVA RESPIMAT 2.5 MCG/ACT IN AERS: 2.0000 | INHALATION_SPRAY | Freq: Every day | RESPIRATORY_TRACT | 1 refills | Status: DC

## 2019-05-31 ENCOUNTER — Telehealth: Payer: Self-pay | Admitting: Pulmonary Disease

## 2019-05-31 MED ORDER — SPIRIVA RESPIMAT 2.5 MCG/ACT IN AERS: 2.0000 | INHALATION_SPRAY | Freq: Every day | RESPIRATORY_TRACT | 3 refills | Status: DC

## 2019-05-31 NOTE — Telephone Encounter (Signed)
  Spoke with pt and advised rx sent to pharmacy. Nothing further is needed.         Patient Instructions by Garner Nash, DO at 05/10/2019 9:30 AM Author: Garner Nash, DO Author Type: Physician Filed: 05/10/2019 10:00 AM  Note Status: Addendum Cosign: Cosign Not Required Encounter Date: 05/10/2019  Editor: Vivia Ewing, LPN (Licensed Practical Nurse)  Prior Versions: 1. Garner Nash, DO (Physician) at 05/10/2019 9:57 AM - Addendum   2. Garner Nash, DO (Physician) at 05/10/2019 9:56 AM - Addendum   3. Garner Nash, DO (Physician) at 05/10/2019 9:55 AM - Addendum   4. Garner Nash, DO (Physician) at 05/10/2019 9:26 AM - Signed    Thank you for visiting Dr. Valeta Harms at [facility]. Today we recommend the following:    Orders Placed This Encounter  Procedures  . CT CHEST WO CONTRAST  . EKG 12-Lead   CT chest ordered for follow-up of mediastinal and hilar adenopathy seen on imaging in February 2020.  Meds ordered this encounter  Medications  . azithromycin (ZITHROMAX) 250 MG tablet    Sig: Take 1 tablet (250 mg total) by mouth every Monday, Wednesday, and Friday.    Dispense:  36 tablet    Refill:  3   Stop use of DuoNeb.  We will change to as needed albuterol nebulized solution. Add Spiriva Respimat 2.5, 2 puffs once daily.  We will show you how to use this today.  Continue use of Symbicort 160, 2 puffs twice daily  Continue azithromycin Monday Wednesday Friday, dose reduced to 250 mg.  Return in about 4 weeks (around 06/07/2019) for with APP or Dr. Valeta Harms.

## 2019-06-02 DIAGNOSIS — E1122 Type 2 diabetes mellitus with diabetic chronic kidney disease: Secondary | ICD-10-CM | POA: Diagnosis not present

## 2019-06-02 DIAGNOSIS — E782 Mixed hyperlipidemia: Secondary | ICD-10-CM | POA: Diagnosis not present

## 2019-06-02 DIAGNOSIS — K219 Gastro-esophageal reflux disease without esophagitis: Secondary | ICD-10-CM | POA: Diagnosis not present

## 2019-06-02 DIAGNOSIS — I2510 Atherosclerotic heart disease of native coronary artery without angina pectoris: Secondary | ICD-10-CM | POA: Diagnosis not present

## 2019-06-02 DIAGNOSIS — M109 Gout, unspecified: Secondary | ICD-10-CM | POA: Diagnosis not present

## 2019-06-02 DIAGNOSIS — I10 Essential (primary) hypertension: Secondary | ICD-10-CM | POA: Diagnosis not present

## 2019-06-02 DIAGNOSIS — E1165 Type 2 diabetes mellitus with hyperglycemia: Secondary | ICD-10-CM | POA: Diagnosis not present

## 2019-06-02 DIAGNOSIS — N183 Chronic kidney disease, stage 3 unspecified: Secondary | ICD-10-CM | POA: Diagnosis not present

## 2019-06-02 DIAGNOSIS — R945 Abnormal results of liver function studies: Secondary | ICD-10-CM | POA: Diagnosis not present

## 2019-06-02 NOTE — Progress Notes (Signed)
Cardiology Office Note    Date:  06/04/2019   ID:  Roger Lowe., DOB 08-26-1946, MRN [id number]  PCP:  Roger Squibb, MD  Cardiologist: Roger Dolly, MD    Chief Complaint  Patient presents with  . Follow-up    3 week visit    History of Present Illness:    Roger Nott. is a 73 y.o. male with past medical history of CAD (cath in 2016 showing mild nonobstructive disease, nonobstructive CAD by repeat cath in 10/2018), chronic combined systolic and diastolic CHF(EF 14-43% by echo in 07/2017, at 20-25% by repeat echo in 10/2018),COPD,HTN, HLD, Type 2 DM, and history of PE (on Xarelto) who presents to the office today for 3-week follow-up.  He was last examined by Roger Kicks, NP on 05/12/2019 and had recently undergone a repeat echocardiogram which showed his EF remained reduced at 25 to 30% and Losartan had been discontinued at that time with him being started on Entresto 49-51mg  BID. BP was stable at 134/76 and Entresto was increased to 97-103mg  BID along with him being continued on Coreg 25mg  BID. Was recommended to have repeat labs in 2 weeks and follow-up in 3 to consider addition of Spironolactone if BP allowed.   In talking with the patient today, he reports initially feeling dizzy after the most recent dose increase of Entresto but this resolved within 2-3 days and he has been tolerating well since. BP has been well-controlled when checked at home. He has baseline dyspnea on exertion secondary to COPD and is on 2L [place] at baseline. Inhalers were recently switched by Pulmonology and he reports improvement in his dyspnea with this.   No recent chest pain, palpitations, orthopnea, PND, or edema.   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    Past Medical History:  Diagnosis Date  . Asthma   . Cancer ([place])   . CHF (congestive heart failure) ([place])    a. EF 45-50% by echo in 07/2017 b. EF reduced to 20-25% by  repeat echo in 10/2018  . Coronary artery disease    a. cath in 2016 showing mild nonobstructive disease b. cath in 10/2018 showing nonobstructive CAD with 10% LM stenosis, 25% Proximal-LAD, 25% LCx, and mild pulmonary HTN  . Diabetes mellitus without complication ([place])   . Gout   . History of pulmonary embolus (PE) 2016  . Hypertension   . Lung cancer ([place])   . Pneumonia     Past Surgical History:  Procedure Laterality Date  . CERVICAL SPINE SURGERY    . RIGHT/LEFT HEART CATH AND CORONARY ANGIOGRAPHY N/A 11/08/2018   Procedure: RIGHT/LEFT HEART CATH AND CORONARY ANGIOGRAPHY;  Surgeon: Roger Booze, MD;  Location: [facility];  Service: Cardiovascular;  Laterality: N/A;    Current Medications: Outpatient Medications Prior to Visit  Medication Sig Dispense Refill  . albuterol (PROVENTIL HFA;VENTOLIN HFA) 108 (90 Base) MCG/ACT inhaler Inhale 2 puffs into the lungs 2 (two) times a day.     . albuterol (PROVENTIL) (2.5 MG/3ML) 0.083% nebulizer solution Take 3 mLs (2.5 mg total) by nebulization every 4 (four) hours. And PRN. 540 mL 1  . allopurinol (ZYLOPRIM) 300 MG tablet Take 300 mg by mouth daily.    [name] amitriptyline (ELAVIL) 50 MG tablet Take 50 mg by mouth 2 (two) times daily.    [name] arformoterol (BROVANA) 15 MCG/2ML NEBU Take 2 mLs (15 mcg total) by nebulization 2 (two)  times daily. 120 mL 6  . atorvastatin (LIPITOR) 20 MG tablet Take 1 tablet (20 mg total) by mouth at bedtime. 90 tablet 3  . azithromycin (ZITHROMAX) 250 MG tablet Take 1 tablet (250 mg total) by mouth every Monday, Wednesday, and Friday. 36 tablet 3  . budesonide (PULMICORT) 0.5 MG/2ML nebulizer solution Take 2 mLs (0.5 mg total) by nebulization 2 (two) times daily. 120 mL 12  . budesonide-formoterol (SYMBICORT) 160-4.5 MCG/ACT inhaler Inhale 2 puffs into the lungs 2 (two) times daily.    . carvedilol (COREG) 25 MG tablet Take 1 tablet (25 mg total) by mouth 2 (two) times daily. 180 tablet 3  . famotidine  (PEPCID) 40 MG tablet Take 40 mg by mouth 2 (two) times daily.     . furosemide (LASIX) 20 MG tablet Take 1 tablet (20 mg total) by mouth daily as needed for fluid. 90 tablet 3  . hydrALAZINE (APRESOLINE) 50 MG tablet Take 1 tablet (50 mg total) by mouth 2 (two) times daily. 180 tablet 3  . HYDROcodone-acetaminophen (NORCO) 10-325 MG tablet Take 1 tablet by mouth every 6 (six) hours as needed for moderate pain. 12 tablet 0  . insulin aspart (NOVOLOG FLEXPEN) 100 UNIT/ML FlexPen Inject 25 Units into the skin 3 (three) times daily with meals. Plus personal sliding scale    . Insulin Detemir (LEVEMIR FLEXTOUCH) 100 UNIT/ML Pen Inject 45 Units into the skin at bedtime. 15 mL 0  . ipratropium-albuterol (DUONEB) 0.5-2.5 (3) MG/3ML SOLN Take 3 mLs by nebulization every 4 (four) hours as needed. (Patient taking differently: Take 3 mLs by nebulization every 4 (four) hours as needed (shortness of breath). ) 180 mL 0  . neomycin-polymyxin-hydrocortisone (CORTISPORIN) 3.5-10000-1 OTIC suspension Place 4 drops into the left ear 4 (four) times daily. X 7 days 10 mL 0  . nitroGLYCERIN (NITROSTAT) 0.4 MG SL tablet Place 1 tablet (0.4 mg total) under the tongue every 5 (five) minutes as needed for chest pain. 25 tablet 2  . rivaroxaban (XARELTO) 20 MG TABS tablet Take 1 tablet (20 mg total) by mouth every morning. 90 tablet 3  . sacubitril-valsartan (ENTRESTO) 97-103 MG Take 1 tablet by mouth 2 (two) times daily. 60 tablet 9  . Tiotropium Bromide Monohydrate (SPIRIVA RESPIMAT) 2.5 MCG/ACT AERS Inhale 2 puffs into the lungs daily. 4 g 3  . sacubitril-valsartan (ENTRESTO) 49-51 MG Take 1 tablet by mouth 2 (two) times daily. 60 tablet 11   No facility-administered medications prior to visit.      Allergies:   Patient has no known allergies.   Social History   Socioeconomic History  . Marital status: Single    Spouse name: Not on file  . Number of children: Not on file  . Years of education: Not on file  .  Highest education level: Not on file  Occupational History  . Not on file  Social Needs  . Financial resource strain: Not on file  . Food insecurity    Worry: Not on file    Inability: Not on file  . Transportation needs    Medical: Not on file    Non-medical: Not on file  Tobacco Use  . Smoking status: Former Smoker    Quit date: 09/05/2003    Years since quitting: 15.7  . Smokeless tobacco: Never Used  Substance and Sexual Activity  . Alcohol use: Not Currently  . Drug use: Not Currently  . Sexual activity: Not on file  Lifestyle  . Physical activity  Days per week: Not on file    Minutes per session: Not on file  . Stress: Not on file  Relationships  . Social Herbalist on phone: Not on file    Gets together: Not on file    Attends religious service: Not on file    Active member of club or organization: Not on file    Attends meetings of clubs or organizations: Not on file    Relationship status: Not on file  Other Topics Concern  . Not on file  Social History Narrative  . Not on file     Family History:  The patient's family history includes CAD in his brother.   Review of Systems:   Please see the history of present illness.     General:  No chills, fever, night sweats or weight changes.  Cardiovascular:  No chest pain, edema, orthopnea, palpitations, paroxysmal nocturnal dyspnea. Positive for dyspnea on exertion.  Dermatological: No rash, lesions/masses Respiratory: No cough, dyspnea Urologic: No hematuria, dysuria Abdominal:   No nausea, vomiting, diarrhea, bright red blood per rectum, melena, or hematemesis Neurologic:  No visual changes, wkns, changes in mental status. All other systems reviewed and are otherwise negative except as noted above.   Physical Exam:    VS:  BP 128/78   Pulse 97   Temp 97.9 F (36.6 C)   Ht 6' (1.829 m)   Wt 227 lb (103 kg)   SpO2 94% Comment: 2 L  BMI 30.79 kg/m    General: Well developed, well  nourished,male appearing in no acute distress. Head: Normocephalic, atraumatic, sclera non-icteric, no xanthomas, nares are without discharge.  Neck: No carotid bruits. JVD not elevated.  Lungs: Respirations regular and unlabored, without wheezes or rales. On 2L  at baseline.  Heart: Regular rate and rhythm. No S3 or S4.  No murmur, no rubs, or gallops appreciated. Abdomen: Soft, non-tender, non-distended with normoactive bowel sounds. No hepatomegaly. No rebound/guarding. No obvious abdominal masses. Msk:  Strength and tone appear normal for age. No joint deformities or effusions. Extremities: No clubbing or cyanosis. No lower extremity edema.  Distal pedal pulses are 2+ bilaterally. Neuro: Alert and oriented X 3. Moves all extremities spontaneously. No focal deficits noted. Psych:  Responds to questions appropriately with a normal affect. Skin: No rashes or lesions noted  Wt Readings from Last 3 Encounters:  06/03/19 227 lb (103 kg)  05/12/19 228 lb (103.4 kg)  05/10/19 228 lb (103.4 kg)        Studies/Labs Reviewed:   EKG:  EKG is not ordered today.    Recent Labs: 10/11/2018: B Natriuretic Peptide 499.0 02/01/2019: ALT 15; Hemoglobin 16.6; Platelets 209 05/10/2019: BUN 15; Creatinine, Ser 1.14; Magnesium 2.0; Potassium 4.7; Sodium 138   Lipid Panel No results found for: CHOL, TRIG, HDL, CHOLHDL, VLDL, LDLCALC, LDLDIRECT  Additional studies/ records that were reviewed today include:   Cardiac Catheterization: 10/2018  Mid LM lesion is 10% stenosed.  Prox Cx to Mid Cx lesion is 25% stenosed.  Prox LAD lesion is 25% stenosed.  There is moderate left ventricular systolic dysfunction.  There is no aortic valve stenosis.  Hemodynamic findings consistent with mild pulmonary hypertension.  Ao sat 98%, PA sat 72%, PA mean 33 mm Hg, PCWP mean 31 mm Hg; CO 5.99 L/min; CI 2.63   Nonobstructive CAD.  Continue medical therapy for nonischemic cardiomyopathy.     Echocardiogram: 04/11/2019 IMPRESSIONS    1. Stage 1: 1: Multiple segmental abnormalities  exist. See findings.  2. The left ventricle has severely reduced systolic function, with an ejection fraction of 25-30%. The cavity size was normal. There is mild concentric left ventricular hypertrophy. Left ventricular diastolic Doppler parameters are indeterminate.  Elevated left ventricular end-diastolic pressure Left ventricular diffuse hypokinesis.  3. The right ventricle has mildly reduced systolic function. The cavity was normal. There is mildly increased right ventricular wall thickness.  4. The aortic valve is tricuspid. Mild aortic annular calcification noted.  5. The mitral valve is degenerative. Mild thickening of the mitral valve leaflet. Mild calcification of the mitral valve leaflet. Mitral valve regurgitation is mild to moderate by color flow Doppler.  6. The tricuspid valve is grossly normal.  7. The aorta is normal in size and structure.  Assessment:    1. Chronic combined systolic (congestive) and diastolic (congestive) heart failure ([place])   2. Coronary artery disease involving native coronary artery of native heart without angina pectoris   3. Essential hypertension   4. Hyperlipidemia LDL goal <70   5. Chronic obstructive pulmonary disease, unspecified COPD type ([place])   6. History of pulmonary embolus (PE)      Plan:   In order of problems listed above:  1. Chronic Combined Systolic and Diastolic CHF/ NICM - EF 47-42% by echo in 07/2017, at 20-25% by repeat echo in 10/2018, and 25-30% by imaging in 04/2019. He has baseline dyspnea on exertion but denies any recent orthopnea, PND, or edema. Weight has been stable on his home scales.  - he is currently on Coreg 25mg  BID, Entresto 97-103mg  BID, Hydralazine 50mg  BID and Lasix 20mg  daily. He had recent labs this past Monday following dose titration of Entresto. If renal function overall stable, would plan to add Spironolactone.  Will need to follow K+ closely as he had issues with hyperkalemia earlier this year but was on K+ supplementation at that time.  - reviewed the overall plan of repeating an echo in 3 months once on maximum medical therapy and he says he would be in agreement to have an ICD at that time if EF remains reduced.   2. CAD - nonobstructive CAD by cath in 2016 and repeat cath in 10/2018. He denies any recent chest pain and breathing has been at baseline. Continue BB and statin therapy. Not on ASA given the need for anticoagulation.   3. HTN - BP is well-controlled at 128/78 during today's visit. Continue current medication regimen with possible addition of Spironolactone or dose adjustment of Hydralazine as outlined above.   4. HLD - followed by PCP. Reports having labs obtained this past Monday and will request records. Continue Atorvastatin 20mg  daily.   5. COPD - on 2L [place] at baseline. Followed by Dr. Valeta Harms with [facility].   6. History of PE - he denies any evidence of active bleeding. Remains on Xarelto for anticoagulation.   Medication Adjustments/Labs and Tests Ordered: Current medicines are reviewed at length with the patient today.  Concerns regarding medicines are outlined above.  Medication changes, Labs and Tests ordered today are listed in the Patient Instructions below. Patient Instructions  Medication Instructions: Your physician recommends that you continue on your current medications as directed. Please refer to the Current Medication list given to you today.   Labwork: None today  Procedures/Testing: None today  Follow-Up:  6-8 weeks with Dr.Branch or [place], PA-C  Any Additional Special Instructions Will Be Listed Below (If Applicable).     If you need a refill on  your cardiac medications before your next appointment, please call your pharmacy.     Thank you for choosing [facility] !           Signed,  Erma Heritage, PA-C  06/04/2019 8:24 AM    [facility] [address] [place], [place] [address] Phone: [phone] Fax: [phone]

## 2019-06-03 ENCOUNTER — Encounter: Payer: Self-pay | Admitting: Student

## 2019-06-03 ENCOUNTER — Other Ambulatory Visit: Payer: Self-pay

## 2019-06-03 ENCOUNTER — Ambulatory Visit (INDEPENDENT_AMBULATORY_CARE_PROVIDER_SITE_OTHER): Payer: Medicare HMO | Admitting: Student

## 2019-06-03 VITALS — BP 128/78 | HR 97 | Temp 97.9°F | Ht 72.0 in | Wt 227.0 lb

## 2019-06-03 DIAGNOSIS — Z86711 Personal history of pulmonary embolism: Secondary | ICD-10-CM

## 2019-06-03 DIAGNOSIS — E785 Hyperlipidemia, unspecified: Secondary | ICD-10-CM

## 2019-06-03 DIAGNOSIS — I2510 Atherosclerotic heart disease of native coronary artery without angina pectoris: Secondary | ICD-10-CM | POA: Diagnosis not present

## 2019-06-03 DIAGNOSIS — I5042 Chronic combined systolic (congestive) and diastolic (congestive) heart failure: Secondary | ICD-10-CM | POA: Diagnosis not present

## 2019-06-03 DIAGNOSIS — I10 Essential (primary) hypertension: Secondary | ICD-10-CM

## 2019-06-03 DIAGNOSIS — J449 Chronic obstructive pulmonary disease, unspecified: Secondary | ICD-10-CM

## 2019-06-03 NOTE — Patient Instructions (Signed)
Medication Instructions: Your physician recommends that you continue on your current medications as directed. Please refer to the Current Medication list given to you today.   Labwork: None today  Procedures/Testing: None today  Follow-Up:  6-8 weeks with dr.branch or [place], PA-C  Any Additional Special Instructions Will Be Listed Below (If Applicable).     If you need a refill on your cardiac medications before your next appointment, please call your pharmacy.     Thank you for choosing [facility] !

## 2019-06-04 ENCOUNTER — Encounter: Payer: Self-pay | Admitting: Student

## 2019-06-07 ENCOUNTER — Other Ambulatory Visit: Payer: Self-pay

## 2019-06-07 ENCOUNTER — Telehealth: Payer: Self-pay | Admitting: Student

## 2019-06-07 ENCOUNTER — Encounter: Payer: Self-pay | Admitting: Pulmonary Disease

## 2019-06-07 ENCOUNTER — Encounter (INDEPENDENT_AMBULATORY_CARE_PROVIDER_SITE_OTHER): Payer: Self-pay | Admitting: *Deleted

## 2019-06-07 ENCOUNTER — Ambulatory Visit (INDEPENDENT_AMBULATORY_CARE_PROVIDER_SITE_OTHER): Payer: Medicare HMO | Admitting: Pulmonary Disease

## 2019-06-07 VITALS — BP 138/90 | HR 107

## 2019-06-07 DIAGNOSIS — J189 Pneumonia, unspecified organism: Secondary | ICD-10-CM

## 2019-06-07 DIAGNOSIS — Z7901 Long term (current) use of anticoagulants: Secondary | ICD-10-CM | POA: Diagnosis not present

## 2019-06-07 DIAGNOSIS — Z5181 Encounter for therapeutic drug level monitoring: Secondary | ICD-10-CM

## 2019-06-07 DIAGNOSIS — Z23 Encounter for immunization: Secondary | ICD-10-CM | POA: Diagnosis not present

## 2019-06-07 DIAGNOSIS — Z66 Do not resuscitate: Secondary | ICD-10-CM

## 2019-06-07 DIAGNOSIS — R590 Localized enlarged lymph nodes: Secondary | ICD-10-CM

## 2019-06-07 DIAGNOSIS — J9611 Chronic respiratory failure with hypoxia: Secondary | ICD-10-CM

## 2019-06-07 DIAGNOSIS — Z85118 Personal history of other malignant neoplasm of bronchus and lung: Secondary | ICD-10-CM

## 2019-06-07 DIAGNOSIS — Z79899 Other long term (current) drug therapy: Secondary | ICD-10-CM

## 2019-06-07 NOTE — Patient Instructions (Addendum)
Thank you for visiting Dr. Valeta Harms at [facility]. Today we recommend the following:  Flu shot today Continue Brovana + Pulmicort Nebs Continue Spiriva 2 puffs, same time, once per day  Continue albuterol (only) nebs as needed for shortness of breath or wheezing  Continue azithromycin MWF  Start Vitamin D supplement, 2000 IU Daily   Return in about 4 months (around 10/08/2019) for with APP or Dr. Valeta Harms.    Please do your part to reduce the spread of COVID-19.

## 2019-06-07 NOTE — Telephone Encounter (Signed)
   Please let the patient know I reviewed his recent labs. Creatinine was overall stable at 1.24 and K+ was at 4.6. Would recommend starting Spironolactone 12.5mg  daily to help with his cardiomyopathy. Repeat BMET in 2 weeks as we need to make sure his kidney function and potassium remain stable with this addition.   Signed, Erma Heritage, PA-C 06/07/2019, 12:43 PM Pager: [phone]

## 2019-06-07 NOTE — Progress Notes (Signed)
Synopsis: Referred in September 2020 for recurrent pneumonia by [name], MD  Subjective:   PATIENT ID: Roger Lowe. GENDER: male DOB: 11-Feb-1946, MRN: [id number]  Chief Complaint  Patient presents with  . Follow-up    Ct 09/22. Using 2L pulsed. Zithromax MWF.     73 year old gentleman past medical history of congestive heart failure, EF 20 to 25%, diabetes, history of PE, hypertension, lung cancer (? S/p resection on left side, patient unsure if upper or lower). Last PFTS completed 2 years ago in [place]. Saw pulmonologist in [place] regularly. Currently on duonebs, symbiocort 160, Azithro MWF, vest therapy. We currently dont have records from [place] pulmonologist. We will need to request these.  He uses it some vest therapy regularly.  He does state that he had several hospitalizations in [place] with recurrent pneumonias.  After being placed on the vest therapy had significant improvement with his airway clearance.  Currently using duo nebs only 1-2 times per week.  Not on triple therapy at this time.  He did have a recent hospitalization.  Most recent ejection fraction was depressed.  We discussed goals of care today in the office as well.  He does have durable DNR forms completed at home.  We discussed the benefit utility of having a DNR medical bracelet or necklace tag to help ensure that his wishes are met.  OV 06/07/2019: Patient here for follow-up regarding his COPD.  Recent follow-up for CAT scan completed in September.  CT scan was completed which revealed left upper lobe airspace disease concerning for pneumonia.  This was not present on imaging from February 2020.  Recent changes to his medication regimen after last visit.  We stopped his Symbicort went to nebulized therapy.  Currently on Brovana plus Pulmicort, Spiriva Respimat 2 puffs once a day.  He has been doing well with this new regimen.  He does feel like his breathing is better from the comparison before.  He is also followed up  closely with the cardiology and heart failure clinic.  Otherwise he is very happy with his new team/health care team after moving from [place].  Patient denies fevers chills night sweats weight loss sputum production above his baseline.   Past Medical History:  Diagnosis Date  . Asthma   . Cancer ([place])   . CHF (congestive heart failure) ([place])    a. EF 45-50% by echo in 07/2017 b. EF reduced to 20-25% by repeat echo in 10/2018  . Coronary artery disease    a. cath in 2016 showing mild nonobstructive disease b. cath in 10/2018 showing nonobstructive CAD with 10% LM stenosis, 25% Proximal-LAD, 25% LCx, and mild pulmonary HTN  . Diabetes mellitus without complication ([place])   . Gout   . History of pulmonary embolus (PE) 2016  . Hypertension   . Lung cancer ([place])   . Pneumonia      Family History  Problem Relation Age of Onset  . CAD Brother      Past Surgical History:  Procedure Laterality Date  . CERVICAL SPINE SURGERY    . RIGHT/LEFT HEART CATH AND CORONARY ANGIOGRAPHY N/A 11/08/2018   Procedure: RIGHT/LEFT HEART CATH AND CORONARY ANGIOGRAPHY;  Surgeon: [name], MD;  Location: [facility];  Service: Cardiovascular;  Laterality: N/A;    Social History   Socioeconomic History  . Marital status: Single    Spouse name: Not on file  . Number of children: Not on file  . Years of education: Not  on file  . Highest education level: Not on file  Occupational History  . Not on file  Social Needs  . Financial resource strain: Not on file  . Food insecurity    Worry: Not on file    Inability: Not on file  . Transportation needs    Medical: Not on file    Non-medical: Not on file  Tobacco Use  . Smoking status: Former Smoker    Quit date: 09/05/2003    Years since quitting: 15.7  . Smokeless tobacco: Never Used  Substance and Sexual Activity  . Alcohol use: Not Currently  . Drug use: Not Currently  . Sexual activity: Not on file  Lifestyle  . Physical activity     Days per week: Not on file    Minutes per session: Not on file  . Stress: Not on file  Relationships  . Social Herbalist on phone: Not on file    Gets together: Not on file    Attends religious service: Not on file    Active member of club or organization: Not on file    Attends meetings of clubs or organizations: Not on file    Relationship status: Not on file  . Intimate partner violence    Fear of current or ex partner: Not on file    Emotionally abused: Not on file    Physically abused: Not on file    Forced sexual activity: Not on file  Other Topics Concern  . Not on file  Social History Narrative  . Not on file     No Known Allergies   Outpatient Medications Prior to Visit  Medication Sig Dispense Refill  . albuterol (PROVENTIL HFA;VENTOLIN HFA) 108 (90 Base) MCG/ACT inhaler Inhale 2 puffs into the lungs 2 (two) times a day.     . albuterol (PROVENTIL) (2.5 MG/3ML) 0.083% nebulizer solution Take 3 mLs (2.5 mg total) by nebulization every 4 (four) hours. And PRN. 540 mL 1  . allopurinol (ZYLOPRIM) 300 MG tablet Take 300 mg by mouth daily.    [name] amitriptyline (ELAVIL) 50 MG tablet Take 50 mg by mouth 2 (two) times daily.    [name] arformoterol (BROVANA) 15 MCG/2ML NEBU Take 2 mLs (15 mcg total) by nebulization 2 (two) times daily. 120 mL 6  . atorvastatin (LIPITOR) 20 MG tablet Take 1 tablet (20 mg total) by mouth at bedtime. 90 tablet 3  . azithromycin (ZITHROMAX) 250 MG tablet Take 1 tablet (250 mg total) by mouth every Monday, Wednesday, and Friday. 36 tablet 3  . budesonide (PULMICORT) 0.5 MG/2ML nebulizer solution Take 2 mLs (0.5 mg total) by nebulization 2 (two) times daily. 120 mL 12  . budesonide-formoterol (SYMBICORT) 160-4.5 MCG/ACT inhaler Inhale 2 puffs into the lungs 2 (two) times daily.    . carvedilol (COREG) 25 MG tablet Take 1 tablet (25 mg total) by mouth 2 (two) times daily. 180 tablet 3  . famotidine (PEPCID) 40 MG tablet Take 40 mg by mouth 2 (two)  times daily.     . furosemide (LASIX) 20 MG tablet Take 1 tablet (20 mg total) by mouth daily as needed for fluid. 90 tablet 3  . hydrALAZINE (APRESOLINE) 50 MG tablet Take 1 tablet (50 mg total) by mouth 2 (two) times daily. 180 tablet 3  . HYDROcodone-acetaminophen (NORCO) 10-325 MG tablet Take 1 tablet by mouth every 6 (six) hours as needed for moderate pain. 12 tablet 0  . insulin aspart (NOVOLOG FLEXPEN) 100  UNIT/ML FlexPen Inject 25 Units into the skin 3 (three) times daily with meals. Plus personal sliding scale    . Insulin Detemir (LEVEMIR FLEXTOUCH) 100 UNIT/ML Pen Inject 45 Units into the skin at bedtime. 15 mL 0  . ipratropium-albuterol (DUONEB) 0.5-2.5 (3) MG/3ML SOLN Take 3 mLs by nebulization every 4 (four) hours as needed. (Patient taking differently: Take 3 mLs by nebulization every 4 (four) hours as needed (shortness of breath). ) 180 mL 0  . neomycin-polymyxin-hydrocortisone (CORTISPORIN) 3.5-10000-1 OTIC suspension Place 4 drops into the left ear 4 (four) times daily. X 7 days 10 mL 0  . nitroGLYCERIN (NITROSTAT) 0.4 MG SL tablet Place 1 tablet (0.4 mg total) under the tongue every 5 (five) minutes as needed for chest pain. 25 tablet 2  . rivaroxaban (XARELTO) 20 MG TABS tablet Take 1 tablet (20 mg total) by mouth every morning. 90 tablet 3  . sacubitril-valsartan (ENTRESTO) 97-103 MG Take 1 tablet by mouth 2 (two) times daily. 60 tablet 9  . Tiotropium Bromide Monohydrate (SPIRIVA RESPIMAT) 2.5 MCG/ACT AERS Inhale 2 puffs into the lungs daily. 4 g 3   No facility-administered medications prior to visit.     Review of Systems  Constitutional: Negative for chills, fever, malaise/fatigue and weight loss.  HENT: Negative for hearing loss, sore throat and tinnitus.   Eyes: Negative for blurred vision and double vision.  Respiratory: Positive for sputum production and shortness of breath. Negative for cough, hemoptysis, wheezing and stridor.   Cardiovascular: Negative for chest  pain, palpitations, orthopnea, leg swelling and PND.  Gastrointestinal: Negative for abdominal pain, constipation, diarrhea, heartburn, nausea and vomiting.  Genitourinary: Negative for dysuria, hematuria and urgency.  Musculoskeletal: Negative for joint pain and myalgias.  Skin: Negative for itching and rash.  Neurological: Negative for dizziness, tingling, weakness and headaches.  Endo/Heme/Allergies: Negative for environmental allergies. Does not bruise/bleed easily.  Psychiatric/Behavioral: Negative for depression. The patient is not nervous/anxious and does not have insomnia.   All other systems reviewed and are negative.    Objective:  Physical Exam Vitals signs reviewed.  Constitutional:      General: He is not in acute distress.    Appearance: He is well-developed.  HENT:     Head: Normocephalic and atraumatic.     Mouth/Throat:     Pharynx: No oropharyngeal exudate.  Eyes:     Conjunctiva/sclera: Conjunctivae normal.     Pupils: Pupils are equal, round, and reactive to light.  Neck:     Vascular: No JVD.     Trachea: No tracheal deviation.     Comments: Loss of supraclavicular fat Cardiovascular:     Rate and Rhythm: Normal rate and regular rhythm.     Heart sounds: S1 normal and S2 normal.     Comments: Distant heart tones Pulmonary:     Effort: No tachypnea or accessory muscle usage.     Breath sounds: No stridor. Decreased breath sounds (throughout all lung fields) present. No wheezing, rhonchi or rales.  Abdominal:     General: Bowel sounds are normal. There is no distension.     Palpations: Abdomen is soft.     Tenderness: There is no abdominal tenderness.  Musculoskeletal:        General: Deformity (muscle wasting ) present.  Skin:    General: Skin is warm and dry.     Capillary Refill: Capillary refill takes less than 2 seconds.     Findings: No rash.  Neurological:     Mental Status:  He is alert and oriented to person, place, and time.  Psychiatric:         Behavior: Behavior normal.      Vitals:   06/07/19 0940  BP: 138/90  Pulse: (!) 107  SpO2: 98%   98% on RA BMI Readings from Last 3 Encounters:  06/03/19 30.79 kg/m  05/12/19 30.08 kg/m  05/10/19 30.08 kg/m   Wt Readings from Last 3 Encounters:  06/03/19 227 lb (103 kg)  05/12/19 228 lb (103.4 kg)  05/10/19 228 lb (103.4 kg)     CBC    Component Value Date/Time   WBC 8.8 02/01/2019 1117   RBC 5.59 02/01/2019 1117   HGB 16.6 02/01/2019 1117   HCT 51.2 02/01/2019 1117   PLT 209 02/01/2019 1117   MCV 91.6 02/01/2019 1117   MCH 29.7 02/01/2019 1117   MCHC 32.4 02/01/2019 1117   RDW 14.0 02/01/2019 1117   LYMPHSABS 2.3 02/01/2019 1117   MONOABS 0.8 02/01/2019 1117   EOSABS 0.2 02/01/2019 1117   BASOSABS 0.0 02/01/2019 1117    Chest Imaging:  10/11/2018 CT chest: Left basilar airspace disease, small pleural effusion bilateral. Mediastinal adenopathy, hilar adenopathy  September 2020 CT chest: Left upper lobe infiltrate, no effusion  Pulmonary Functions Testing Results: No flowsheet data found.  FeNO: None   Pathology:  ?locate previous lung cancer diagnosis   Echocardiogram: None   Heart Catheterization: None     Assessment & Plan:     ICD-10-CM   1. Community acquired pneumonia of left upper lobe of lung  J18.9   2. Chronic hypoxemic respiratory failure (HCC)  J96.11   3. H/O: lung cancer  Z85.118   4. Mediastinal adenopathy  R59.0   5. On continuous oral anticoagulation  Z79.01   6. DNR (do not resuscitate)  Z66   7. Encounter for therapeutic drug monitoring  Z51.81   8. Needs flu shot  Z23     Discussion:  73 year old gentleman past medical history of congestive heart failure chronic systolic heart failure history of PE, history of lung cancer, history of chronic hypoxemic respiratory failure secondary to severe COPD.  We will continue from his previous pulmonologist in [place].  Plan: Continue vest therapy for sputum production.  Continue nebulized triple therapy to include Pulmicort plus Brovana Continue albuterol nebs for shortness of breath and wheezing Continue Spiriva Respimat 2.5, 2 puffs once daily. Continue azithromycin Monday Wednesday Friday Last ECG was normal, consider repeating at next office visit. Start vitamin D supplement 2000 IU daily this can be bought over-the-counter. Flu shot today  Return to clinic in 4 months for COPD follow-up.  Patient can possibly follow with me at new established outreach clinic in [facility] as he lives up there.  Greater than 50% of this patient's 15-minute office visit was been face-to-face discussing above recommendations and treatment plan.   Current Outpatient Medications:  .  albuterol (PROVENTIL HFA;VENTOLIN HFA) 108 (90 Base) MCG/ACT inhaler, Inhale 2 puffs into the lungs 2 (two) times a day. , Disp: , Rfl:  .  albuterol (PROVENTIL) (2.5 MG/3ML) 0.083% nebulizer solution, Take 3 mLs (2.5 mg total) by nebulization every 4 (four) hours. And PRN., Disp: 540 mL, Rfl: 1 .  allopurinol (ZYLOPRIM) 300 MG tablet, Take 300 mg by mouth daily., Disp: , Rfl:  .  amitriptyline (ELAVIL) 50 MG tablet, Take 50 mg by mouth 2 (two) times daily., Disp: , Rfl:  .  arformoterol (BROVANA) 15 MCG/2ML NEBU, Take 2 mLs (15 mcg  total) by nebulization 2 (two) times daily., Disp: 120 mL, Rfl: 6 .  atorvastatin (LIPITOR) 20 MG tablet, Take 1 tablet (20 mg total) by mouth at bedtime., Disp: 90 tablet, Rfl: 3 .  azithromycin (ZITHROMAX) 250 MG tablet, Take 1 tablet (250 mg total) by mouth every Monday, Wednesday, and Friday., Disp: 36 tablet, Rfl: 3 .  budesonide (PULMICORT) 0.5 MG/2ML nebulizer solution, Take 2 mLs (0.5 mg total) by nebulization 2 (two) times daily., Disp: 120 mL, Rfl: 12 .  budesonide-formoterol (SYMBICORT) 160-4.5 MCG/ACT inhaler, Inhale 2 puffs into the lungs 2 (two) times daily., Disp: , Rfl:  .  carvedilol (COREG) 25 MG tablet, Take 1 tablet (25 mg total) by mouth 2  (two) times daily., Disp: 180 tablet, Rfl: 3 .  famotidine (PEPCID) 40 MG tablet, Take 40 mg by mouth 2 (two) times daily. , Disp: , Rfl:  .  furosemide (LASIX) 20 MG tablet, Take 1 tablet (20 mg total) by mouth daily as needed for fluid., Disp: 90 tablet, Rfl: 3 .  hydrALAZINE (APRESOLINE) 50 MG tablet, Take 1 tablet (50 mg total) by mouth 2 (two) times daily., Disp: 180 tablet, Rfl: 3 .  HYDROcodone-acetaminophen (NORCO) 10-325 MG tablet, Take 1 tablet by mouth every 6 (six) hours as needed for moderate pain., Disp: 12 tablet, Rfl: 0 .  insulin aspart (NOVOLOG FLEXPEN) 100 UNIT/ML FlexPen, Inject 25 Units into the skin 3 (three) times daily with meals. Plus personal sliding scale, Disp: , Rfl:  .  Insulin Detemir (LEVEMIR FLEXTOUCH) 100 UNIT/ML Pen, Inject 45 Units into the skin at bedtime., Disp: 15 mL, Rfl: 0 .  ipratropium-albuterol (DUONEB) 0.5-2.5 (3) MG/3ML SOLN, Take 3 mLs by nebulization every 4 (four) hours as needed. (Patient taking differently: Take 3 mLs by nebulization every 4 (four) hours as needed (shortness of breath). ), Disp: 180 mL, Rfl: 0 .  neomycin-polymyxin-hydrocortisone (CORTISPORIN) 3.5-10000-1 OTIC suspension, Place 4 drops into the left ear 4 (four) times daily. X 7 days, Disp: 10 mL, Rfl: 0 .  nitroGLYCERIN (NITROSTAT) 0.4 MG SL tablet, Place 1 tablet (0.4 mg total) under the tongue every 5 (five) minutes as needed for chest pain., Disp: 25 tablet, Rfl: 2 .  rivaroxaban (XARELTO) 20 MG TABS tablet, Take 1 tablet (20 mg total) by mouth every morning., Disp: 90 tablet, Rfl: 3 .  sacubitril-valsartan (ENTRESTO) 97-103 MG, Take 1 tablet by mouth 2 (two) times daily., Disp: 60 tablet, Rfl: 9 .  Tiotropium Bromide Monohydrate (SPIRIVA RESPIMAT) 2.5 MCG/ACT AERS, Inhale 2 puffs into the lungs daily., Disp: 4 g, Rfl: 3   Garner Nash, DO [facility] 06/07/2019 10:08 AM

## 2019-06-07 NOTE — Telephone Encounter (Signed)
Called patient with test results. No answer. Left message to call back.  

## 2019-06-14 ENCOUNTER — Encounter: Payer: Self-pay | Admitting: *Deleted

## 2019-06-14 DIAGNOSIS — I5032 Chronic diastolic (congestive) heart failure: Secondary | ICD-10-CM | POA: Diagnosis not present

## 2019-06-14 DIAGNOSIS — I5022 Chronic systolic (congestive) heart failure: Secondary | ICD-10-CM | POA: Diagnosis not present

## 2019-06-14 DIAGNOSIS — J9601 Acute respiratory failure with hypoxia: Secondary | ICD-10-CM | POA: Diagnosis not present

## 2019-06-15 DIAGNOSIS — J449 Chronic obstructive pulmonary disease, unspecified: Secondary | ICD-10-CM | POA: Diagnosis not present

## 2019-06-17 MED ORDER — SPIRONOLACTONE 25 MG PO TABS: 12.5000 mg | ORAL_TABLET | Freq: Every day | ORAL | 3 refills | Status: DC

## 2019-06-17 NOTE — Addendum Note (Signed)
Addended by: Levonne Hubert on: 06/17/2019 10:06 AM   Modules accepted: Orders

## 2019-06-17 NOTE — Telephone Encounter (Signed)
Pt notified and voiced understanding 

## 2019-06-21 DIAGNOSIS — H5210 Myopia, unspecified eye: Secondary | ICD-10-CM | POA: Diagnosis not present

## 2019-06-21 DIAGNOSIS — H2510 Age-related nuclear cataract, unspecified eye: Secondary | ICD-10-CM | POA: Diagnosis not present

## 2019-06-27 DIAGNOSIS — M542 Cervicalgia: Secondary | ICD-10-CM | POA: Diagnosis not present

## 2019-06-27 DIAGNOSIS — M5412 Radiculopathy, cervical region: Secondary | ICD-10-CM | POA: Diagnosis not present

## 2019-06-27 DIAGNOSIS — J449 Chronic obstructive pulmonary disease, unspecified: Secondary | ICD-10-CM | POA: Diagnosis not present

## 2019-06-30 DIAGNOSIS — Z961 Presence of intraocular lens: Secondary | ICD-10-CM | POA: Diagnosis not present

## 2019-06-30 DIAGNOSIS — H25042 Posterior subcapsular polar age-related cataract, left eye: Secondary | ICD-10-CM | POA: Diagnosis not present

## 2019-06-30 DIAGNOSIS — H2512 Age-related nuclear cataract, left eye: Secondary | ICD-10-CM | POA: Diagnosis not present

## 2019-06-30 DIAGNOSIS — H47011 Ischemic optic neuropathy, right eye: Secondary | ICD-10-CM | POA: Diagnosis not present

## 2019-06-30 DIAGNOSIS — H25012 Cortical age-related cataract, left eye: Secondary | ICD-10-CM | POA: Diagnosis not present

## 2019-06-30 DIAGNOSIS — H18413 Arcus senilis, bilateral: Secondary | ICD-10-CM | POA: Diagnosis not present

## 2019-07-07 ENCOUNTER — Telehealth: Payer: Self-pay | Admitting: Cardiology

## 2019-07-07 NOTE — Telephone Encounter (Signed)
CALLING TO CHECK ON LABS

## 2019-07-07 NOTE — Telephone Encounter (Signed)
PT wanted to inform Bernerd Pho that he will have labs done tomorrow.

## 2019-07-08 ENCOUNTER — Other Ambulatory Visit (HOSPITAL_COMMUNITY)
Admission: RE | Admit: 2019-07-08 | Discharge: 2019-07-08 | Disposition: A | Discharge status: Home / Self Care | Payer: Medicare HMO | Source: Ambulatory Visit | Attending: Student | Admitting: Student

## 2019-07-08 ENCOUNTER — Other Ambulatory Visit: Payer: Self-pay

## 2019-07-08 DIAGNOSIS — Z79899 Other long term (current) drug therapy: Secondary | ICD-10-CM | POA: Diagnosis not present

## 2019-07-08 LAB — BASIC METABOLIC PANEL
Anion gap: 8 (ref 5–15)
BUN: 14 mg/dL (ref 8–23)
CO2: 24 mmol/L (ref 22–32)
Calcium: 9.2 mg/dL (ref 8.9–10.3)
Chloride: 103 mmol/L (ref 98–111)
Creatinine, Ser: 1.33 mg/dL — ABNORMAL HIGH (ref 0.61–1.24)
GFR calc Af Amer: >60 mL/min (ref >60)
GFR calc non Af Amer: 53 mL/min — ABNORMAL LOW (ref >60)
Glucose, Bld: 138 mg/dL — ABNORMAL HIGH (ref 70–99)
Potassium: 4.6 mmol/L (ref 3.5–5.1)
Sodium: 135 mmol/L (ref 135–145)

## 2019-07-12 ENCOUNTER — Telehealth: Payer: Self-pay | Admitting: *Deleted

## 2019-07-12 NOTE — Telephone Encounter (Signed)
-----   Message from Erma Heritage, [place] sent at 07/09/2019  1:11 PM EST ----- Please let the patient know his electrolytes remain stable, including potassium. Kidney function overall stable when compared to values from earlier this year as creatinine has ranged from 1.1 to 1.4. Stable at 1.33 on most recent check. Continue current medication regimen at this time.

## 2019-07-12 NOTE — Telephone Encounter (Signed)
Called patient with test results. No answer. Unable to leave msg.  

## 2019-07-13 DIAGNOSIS — H2512 Age-related nuclear cataract, left eye: Secondary | ICD-10-CM | POA: Diagnosis not present

## 2019-07-15 ENCOUNTER — Other Ambulatory Visit: Payer: Self-pay

## 2019-07-15 ENCOUNTER — Telehealth (INDEPENDENT_AMBULATORY_CARE_PROVIDER_SITE_OTHER): Payer: Medicare HMO | Admitting: Student

## 2019-07-15 ENCOUNTER — Encounter: Payer: Self-pay | Admitting: Student

## 2019-07-15 VITALS — Wt 219.0 lb

## 2019-07-15 DIAGNOSIS — J449 Chronic obstructive pulmonary disease, unspecified: Secondary | ICD-10-CM

## 2019-07-15 DIAGNOSIS — E785 Hyperlipidemia, unspecified: Secondary | ICD-10-CM

## 2019-07-15 DIAGNOSIS — I5022 Chronic systolic (congestive) heart failure: Secondary | ICD-10-CM | POA: Diagnosis not present

## 2019-07-15 DIAGNOSIS — J9601 Acute respiratory failure with hypoxia: Secondary | ICD-10-CM | POA: Diagnosis not present

## 2019-07-15 DIAGNOSIS — I5042 Chronic combined systolic (congestive) and diastolic (congestive) heart failure: Secondary | ICD-10-CM | POA: Diagnosis not present

## 2019-07-15 DIAGNOSIS — I10 Essential (primary) hypertension: Secondary | ICD-10-CM

## 2019-07-15 DIAGNOSIS — Z86711 Personal history of pulmonary embolism: Secondary | ICD-10-CM

## 2019-07-15 DIAGNOSIS — I2510 Atherosclerotic heart disease of native coronary artery without angina pectoris: Secondary | ICD-10-CM

## 2019-07-15 DIAGNOSIS — I5032 Chronic diastolic (congestive) heart failure: Secondary | ICD-10-CM | POA: Diagnosis not present

## 2019-07-15 MED ORDER — SPIRONOLACTONE 25 MG PO TABS: 25.0000 mg | ORAL_TABLET | Freq: Every day | ORAL | 3 refills | Status: DC

## 2019-07-15 NOTE — Patient Instructions (Signed)
Medication Instructions:  Your physician recommends that you continue on your current medications as directed. Please refer to the Current Medication list given to you today.  Increase Spironolactone 25 mg Daily   *If you need a refill on your cardiac medications before your next appointment, please call your pharmacy*  Lab Work: NONE  If you have labs (blood work) drawn today and your tests are completely normal, you will receive your results only by: [name] MyChart Message (if you have MyChart) OR . A paper copy in the mail If you have any lab test that is abnormal or we need to change your treatment, we will call you to review the results.  Testing/Procedures: Your physician has requested that you have an echocardiogram. Echocardiography is a painless test that uses sound waves to create images of your heart. It provides your doctor with information about the size and shape of your heart and how well your heart's chambers and valves are working. This procedure takes approximately one hour. There are no restrictions for this procedure.   Follow-Up: At [facility], you and your health needs are our priority.  As part of our continuing mission to provide you with exceptional heart care, we have created designated Provider Care Teams.  These Care Teams include your primary Cardiologist (physician) and Advanced Practice Providers (APPs -  Physician Assistants and Nurse Practitioners) who all work together to provide you with the care you need, when you need it.  Your next appointment:   After Echo Appointment   The format for your next appointment:   In Person  Provider:   Carlyle Dolly, MD  Other Instructions Thank you for choosing [facility]!

## 2019-07-15 NOTE — Progress Notes (Signed)
.    Virtual Visit via Telephone Note   This visit type was conducted due to national recommendations for restrictions regarding the COVID-19 Pandemic (e.g. social distancing) in an effort to limit this patient's exposure and mitigate transmission in our community.  Due to his co-morbid illnesses, this patient is at least at moderate risk for complications without adequate follow up.  This format is felt to be most appropriate for this patient at this time.  The patient did not have access to video technology/had technical difficulties with video requiring transitioning to audio format only (telephone).  All issues noted in this document were discussed and addressed.  No physical exam could be performed with this format.  Please refer to the patient's chart for his  consent to telehealth for [facility].   Date:  07/16/2019   ID:  Roger China., DOB 10/30/1945, MRN [id number]  Patient Location: Home Provider Location: Office  PCP:  [name], MD  Cardiologist:  Carlyle Dolly, MD  Electrophysiologist:  None   Evaluation Performed:  Follow-Up Visit  Chief Complaint: 6-week follow-up  History of Present Illness:    Roger Yono. is a 73 y.o. male with past medical history of CAD (cath in 2016 showing mild nonobstructive disease, nonobstructive CAD by repeat cath in 10/2018), chronic combined systolic and diastolic CHF(EF 46-56% by echo in 07/2017, at 20-25% by repeat echo in 10/2018 and 25-30% by echo in 04/2019),COPD,HTN, HLD, Type 2 DM, and history of PE (on Xarelto)  who presents for a follow-up telehealth visit.  He was last examined by myself in 06/2019 and had baseline dyspnea on exertion in the setting of COPD and was utilizing 2 L nasal cannula but denied any recent change in his respiratory status. No recent chest pain or palpitations. His Entresto dosing had recently been titrated and he felt dizzy a few days after starting this but denied any side effects beyond that. He  was continued on Coreg 25 mg twice daily, Entresto 97-103 mg twice daily, Hydralazine 50 mg twice daily and Lasix 20 mg daily with Spironolactone 12.5 mg daily being added to his medication regimen.  Follow-up labs showed creatinine was stable at 1.33 (baseline 1.1 - 1.4) and K+ was 4.6.  In talking with the patient today, he reports doing well. Says he feels like his breathing has improved. Denies any chest pain, palpitations, orthopnea, PND or edema. Weight has actually declined by 8 lbs since his last visit and he reports this has been intentional with dietary changes.   No reported side effects to Spironolactone. Did have some dizziness for the first few days after initiation but he denies any recurrent symptoms. BP has been well-controlled when checked at home. SBP typically in the 120's to 130's.   The patient does not have symptoms concerning for COVID-19 infection (fever, chills, cough, or new shortness of breath).    Past Medical History:  Diagnosis Date  . Asthma   . Cancer ([place])   . CHF (congestive heart failure) ([place])    a. EF 45-50% by echo in 07/2017 b. EF reduced to 20-25% by repeat echo in 10/2018  . Coronary artery disease    a. cath in 2016 showing mild nonobstructive disease b. cath in 10/2018 showing nonobstructive CAD with 10% LM stenosis, 25% Proximal-LAD, 25% LCx, and mild pulmonary HTN  . Diabetes mellitus without complication ([place])   . Gout   . History of pulmonary embolus (PE) 2016  . Hypertension   .  Lung cancer ([place])   . Pneumonia    Past Surgical History:  Procedure Laterality Date  . CERVICAL SPINE SURGERY    . RIGHT/LEFT HEART CATH AND CORONARY ANGIOGRAPHY N/A 11/08/2018   Procedure: RIGHT/LEFT HEART CATH AND CORONARY ANGIOGRAPHY;  Surgeon: [name], MD;  Location: [facility];  Service: Cardiovascular;  Laterality: N/A;     Current Meds  Medication Sig  . albuterol (PROVENTIL HFA;VENTOLIN HFA) 108 (90 Base) MCG/ACT inhaler Inhale 2  puffs into the lungs 2 (two) times a day.   . albuterol (PROVENTIL) (2.5 MG/3ML) 0.083% nebulizer solution Take 3 mLs (2.5 mg total) by nebulization every 4 (four) hours. And PRN.  [name] allopurinol (ZYLOPRIM) 300 MG tablet Take 300 mg by mouth daily.  [name] amitriptyline (ELAVIL) 50 MG tablet Take 50 mg by mouth 2 (two) times daily.  [name] arformoterol (BROVANA) 15 MCG/2ML NEBU Take 2 mLs (15 mcg total) by nebulization 2 (two) times daily.  [name] atorvastatin (LIPITOR) 20 MG tablet Take 1 tablet (20 mg total) by mouth at bedtime.  [name] azithromycin (ZITHROMAX) 250 MG tablet Take 1 tablet (250 mg total) by mouth every Monday, Wednesday, and Friday.  . budesonide (PULMICORT) 0.5 MG/2ML nebulizer solution Take 2 mLs (0.5 mg total) by nebulization 2 (two) times daily.  . budesonide-formoterol (SYMBICORT) 160-4.5 MCG/ACT inhaler Inhale 2 puffs into the lungs 2 (two) times daily.  . carvedilol (COREG) 25 MG tablet Take 1 tablet (25 mg total) by mouth 2 (two) times daily.  . famotidine (PEPCID) 40 MG tablet Take 40 mg by mouth 2 (two) times daily.   . furosemide (LASIX) 20 MG tablet Take 1 tablet (20 mg total) by mouth daily as needed for fluid.  . hydrALAZINE (APRESOLINE) 50 MG tablet Take 1 tablet (50 mg total) by mouth 2 (two) times daily.  [name] HYDROcodone-acetaminophen (NORCO) 10-325 MG tablet Take 1 tablet by mouth every 6 (six) hours as needed for moderate pain.  [name] insulin aspart (NOVOLOG FLEXPEN) 100 UNIT/ML FlexPen Inject 25 Units into the skin 3 (three) times daily with meals. Plus personal sliding scale  . Insulin Detemir (LEVEMIR FLEXTOUCH) 100 UNIT/ML Pen Inject 45 Units into the skin at bedtime.  [name] ipratropium-albuterol (DUONEB) 0.5-2.5 (3) MG/3ML SOLN Take 3 mLs by nebulization every 4 (four) hours as needed. (Patient taking differently: Take 3 mLs by nebulization every 4 (four) hours as needed (shortness of breath). )  . neomycin-polymyxin-hydrocortisone (CORTISPORIN) 3.5-10000-1 OTIC suspension Place 4 drops  into the left ear 4 (four) times daily. X 7 days  . nitroGLYCERIN (NITROSTAT) 0.4 MG SL tablet Place 1 tablet (0.4 mg total) under the tongue every 5 (five) minutes as needed for chest pain.  . rivaroxaban (XARELTO) 20 MG TABS tablet Take 1 tablet (20 mg total) by mouth every morning.  . sacubitril-valsartan (ENTRESTO) 97-103 MG Take 1 tablet by mouth 2 (two) times daily.  [name] spironolactone (ALDACTONE) 25 MG tablet Take 1 tablet (25 mg total) by mouth daily.  . Tiotropium Bromide Monohydrate (SPIRIVA RESPIMAT) 2.5 MCG/ACT AERS Inhale 2 puffs into the lungs daily.  . [DISCONTINUED] spironolactone (ALDACTONE) 25 MG tablet Take 0.5 tablets (12.5 mg total) by mouth daily.     Allergies:   Patient has no known allergies.   Social History   Tobacco Use  . Smoking status: Former Smoker    Quit date: 09/05/2003    Years since quitting: 15.8  . Smokeless tobacco: Never Used  Substance Use Topics  . Alcohol use: Not Currently  .  Drug use: Not Currently     Family Hx: The patient's family history includes CAD in his brother.  ROS:   Please see the history of present illness.     All other systems reviewed and are negative.   Prior CV studies:   The following studies were reviewed today:  Cardiac Catheterization: 10/2018  Mid LM lesion is 10% stenosed.  Prox Cx to Mid Cx lesion is 25% stenosed.  Prox LAD lesion is 25% stenosed.  There is moderate left ventricular systolic dysfunction.  There is no aortic valve stenosis.  Hemodynamic findings consistent with mild pulmonary hypertension.  Ao sat 98%, PA sat 72%, PA mean 33 mm Hg, PCWP mean 31 mm Hg; CO 5.99 L/min; CI 2.63   Nonobstructive CAD.  Continue medical therapy for nonischemic cardiomyopathy.    Echocardiogram: 04/2019 IMPRESSIONS    1. Stage 1: 1: Multiple segmental abnormalities exist. See findings.  2. The left ventricle has severely reduced systolic function, with an ejection fraction of 25-30%. The cavity  size was normal. There is mild concentric left ventricular hypertrophy. Left ventricular diastolic Doppler parameters are indeterminate.  Elevated left ventricular end-diastolic pressure Left ventricular diffuse hypokinesis.  3. The right ventricle has mildly reduced systolic function. The cavity was normal. There is mildly increased right ventricular wall thickness.  4. The aortic valve is tricuspid. Mild aortic annular calcification noted.  5. The mitral valve is degenerative. Mild thickening of the mitral valve leaflet. Mild calcification of the mitral valve leaflet. Mitral valve regurgitation is mild to moderate by color flow Doppler.  6. The tricuspid valve is grossly normal.  7. The aorta is normal in size and structure.  Labs/Other Tests and Data Reviewed:    EKG:  No ECG reviewed.  Recent Labs: 10/11/2018: B Natriuretic Peptide 499.0 02/01/2019: ALT 15; Hemoglobin 16.6; Platelets 209 05/10/2019: Magnesium 2.0 07/08/2019: BUN 14; Creatinine, Ser 1.33; Potassium 4.6; Sodium 135   Recent Lipid Panel No results found for: CHOL, TRIG, HDL, CHOLHDL, LDLCALC, LDLDIRECT  Wt Readings from Last 3 Encounters:  07/15/19 219 lb (99.3 kg)  06/03/19 227 lb (103 kg)  05/12/19 228 lb (103.4 kg)     Objective:    Vital Signs:  Wt 219 lb (99.3 kg)   BMI 29.70 kg/m    General: Pleasant male sounding in NAD Psych: Normal affect. Neuro: Alert and oriented X 3. Lungs:  Resp regular and unlabored while talking on the phone   ASSESSMENT & PLAN:    1. Chronic Combined Systolic and Diastolic CHF/NICM - EF 16-10% by echo in 07/2017, at 20-25% by repeat echo in 10/2018 and 25-30% by echo in 04/2019. - he denies any recent orthopnea, PND or edema. Weight has actually declined on his home scales.  - he is on Coreg 25mg  BID, Entresto 97-103mg  BID, Hydralazine 50mg  BID, and Spironolactone 12.5mg  daily. Will titrate Spironolactone to 25mg  daily. Recheck BMET again in 2 weeks as he had hyperkalemia earlier  this year. Given the recent adjustments of Entresto and addition of Spironolactone, will plan for a repeat limited echo in 3 months to reassess EF. If still below 35%, would recommend EP referral for consideration of ICD placement.   2. CAD - cath in 2016 showed mild nonobstructive disease and he had nonobstructive CAD by repeat cath in 10/2018. He denies any recent chest pain and his dyspnea has improved.  - continue BB and statin therapy. Not on ASA given the need for anticoagulation.   3. HTN - BP has  been well-controlled when checked at home. Continue Coreg, Hydralazine, and Entresto with dose adjustment of Spironolactone as outlined above.   4. HLD - LDL was 66 in 05/2019. Remains on Atorvastatin 20mg  daily.   5. COPD - he remains on 2L [place] at baseline. Followed by [facility] Pulmon[facility].   6. History of PE - he denies any evidence of active bleeding. Remains on Xarelto for anticoagulation.   COVID-19 Education: The signs and symptoms of COVID-19 were discussed with the patient and how to seek care for testing (follow up with PCP or arrange E-visit).  The importance of social distancing was discussed today.  Time:   Today, I have spent 27 minutes with the patient with telehealth technology discussing the above problems.     Medication Adjustments/Labs and Tests Ordered: Current medicines are reviewed at length with the patient today.  Concerns regarding medicines are outlined above.   Tests Ordered: Orders Placed This Encounter  Procedures  . ECHOCARDIOGRAM LIMITED    Medication Changes: Meds ordered this encounter  Medications  . spironolactone (ALDACTONE) 25 MG tablet    Sig: Take 1 tablet (25 mg total) by mouth daily.    Dispense:  90 tablet    Refill:  3    Order Specific Question:   Supervising Provider    Answer:   Dorothy Spark [[id number]]    Follow Up: Limited Echo in 10/2019 and follow-up afterwards  Signed, Erma Heritage, PA-C  07/16/2019 8:19 AM     [place]

## 2019-07-16 DIAGNOSIS — M542 Cervicalgia: Secondary | ICD-10-CM | POA: Diagnosis not present

## 2019-07-18 DIAGNOSIS — J449 Chronic obstructive pulmonary disease, unspecified: Secondary | ICD-10-CM | POA: Diagnosis not present

## 2019-07-19 DIAGNOSIS — M5030 Other cervical disc degeneration, unspecified cervical region: Secondary | ICD-10-CM | POA: Diagnosis not present

## 2019-07-19 DIAGNOSIS — Z981 Arthrodesis status: Secondary | ICD-10-CM | POA: Diagnosis not present

## 2019-07-19 DIAGNOSIS — M542 Cervicalgia: Secondary | ICD-10-CM | POA: Diagnosis not present

## 2019-07-19 DIAGNOSIS — M5412 Radiculopathy, cervical region: Secondary | ICD-10-CM | POA: Diagnosis not present

## 2019-08-03 ENCOUNTER — Other Ambulatory Visit (HOSPITAL_COMMUNITY): Payer: Medicare HMO

## 2019-08-12 DIAGNOSIS — Z0100 Encounter for examination of eyes and vision without abnormal findings: Secondary | ICD-10-CM | POA: Diagnosis not present

## 2019-08-14 DIAGNOSIS — I5022 Chronic systolic (congestive) heart failure: Secondary | ICD-10-CM | POA: Diagnosis not present

## 2019-08-14 DIAGNOSIS — J9601 Acute respiratory failure with hypoxia: Secondary | ICD-10-CM | POA: Diagnosis not present

## 2019-08-14 DIAGNOSIS — I5032 Chronic diastolic (congestive) heart failure: Secondary | ICD-10-CM | POA: Diagnosis not present

## 2019-08-23 DIAGNOSIS — M5412 Radiculopathy, cervical region: Secondary | ICD-10-CM | POA: Diagnosis not present

## 2019-09-01 DIAGNOSIS — J449 Chronic obstructive pulmonary disease, unspecified: Secondary | ICD-10-CM | POA: Diagnosis not present

## 2019-09-06 DIAGNOSIS — E1122 Type 2 diabetes mellitus with diabetic chronic kidney disease: Secondary | ICD-10-CM | POA: Diagnosis not present

## 2019-09-06 DIAGNOSIS — R972 Elevated prostate specific antigen [PSA]: Secondary | ICD-10-CM | POA: Diagnosis not present

## 2019-09-06 DIAGNOSIS — I10 Essential (primary) hypertension: Secondary | ICD-10-CM | POA: Diagnosis not present

## 2019-09-06 DIAGNOSIS — R945 Abnormal results of liver function studies: Secondary | ICD-10-CM | POA: Diagnosis not present

## 2019-09-06 DIAGNOSIS — E782 Mixed hyperlipidemia: Secondary | ICD-10-CM | POA: Diagnosis not present

## 2019-09-06 DIAGNOSIS — M5030 Other cervical disc degeneration, unspecified cervical region: Secondary | ICD-10-CM | POA: Diagnosis not present

## 2019-09-06 DIAGNOSIS — E1165 Type 2 diabetes mellitus with hyperglycemia: Secondary | ICD-10-CM | POA: Diagnosis not present

## 2019-09-08 ENCOUNTER — Ambulatory Visit (HOSPITAL_COMMUNITY): Payer: Medicare HMO | Attending: Orthopedic Surgery | Admitting: Physical Therapy

## 2019-09-08 ENCOUNTER — Telehealth (HOSPITAL_COMMUNITY): Payer: Self-pay | Admitting: Physical Therapy

## 2019-09-08 ENCOUNTER — Encounter (HOSPITAL_COMMUNITY): Payer: Self-pay

## 2019-09-08 DIAGNOSIS — M6281 Muscle weakness (generalized): Secondary | ICD-10-CM | POA: Insufficient documentation

## 2019-09-08 DIAGNOSIS — R29898 Other symptoms and signs involving the musculoskeletal system: Secondary | ICD-10-CM | POA: Insufficient documentation

## 2019-09-08 DIAGNOSIS — M542 Cervicalgia: Secondary | ICD-10-CM | POA: Insufficient documentation

## 2019-09-08 DIAGNOSIS — R293 Abnormal posture: Secondary | ICD-10-CM | POA: Insufficient documentation

## 2019-09-08 NOTE — Telephone Encounter (Signed)
Patient no show for appointment, no answer/busy when called.  10:44 AM, 09/08/19 Mearl Latin PT, DPT Physical Therapist at [facility]

## 2019-09-14 DIAGNOSIS — I2510 Atherosclerotic heart disease of native coronary artery without angina pectoris: Secondary | ICD-10-CM | POA: Diagnosis not present

## 2019-09-14 DIAGNOSIS — I5032 Chronic diastolic (congestive) heart failure: Secondary | ICD-10-CM | POA: Diagnosis not present

## 2019-09-14 DIAGNOSIS — M109 Gout, unspecified: Secondary | ICD-10-CM | POA: Diagnosis not present

## 2019-09-14 DIAGNOSIS — E1122 Type 2 diabetes mellitus with diabetic chronic kidney disease: Secondary | ICD-10-CM | POA: Diagnosis not present

## 2019-09-14 DIAGNOSIS — J9601 Acute respiratory failure with hypoxia: Secondary | ICD-10-CM | POA: Diagnosis not present

## 2019-09-14 DIAGNOSIS — E1165 Type 2 diabetes mellitus with hyperglycemia: Secondary | ICD-10-CM | POA: Diagnosis not present

## 2019-09-14 DIAGNOSIS — K219 Gastro-esophageal reflux disease without esophagitis: Secondary | ICD-10-CM | POA: Diagnosis not present

## 2019-09-14 DIAGNOSIS — I10 Essential (primary) hypertension: Secondary | ICD-10-CM | POA: Diagnosis not present

## 2019-09-14 DIAGNOSIS — G43009 Migraine without aura, not intractable, without status migrainosus: Secondary | ICD-10-CM | POA: Diagnosis not present

## 2019-09-14 DIAGNOSIS — I5022 Chronic systolic (congestive) heart failure: Secondary | ICD-10-CM | POA: Diagnosis not present

## 2019-09-14 DIAGNOSIS — E782 Mixed hyperlipidemia: Secondary | ICD-10-CM | POA: Diagnosis not present

## 2019-09-14 DIAGNOSIS — R945 Abnormal results of liver function studies: Secondary | ICD-10-CM | POA: Diagnosis not present

## 2019-09-15 ENCOUNTER — Ambulatory Visit (HOSPITAL_COMMUNITY): Payer: Medicare HMO | Admitting: Physical Therapy

## 2019-09-15 ENCOUNTER — Other Ambulatory Visit: Payer: Self-pay

## 2019-09-15 ENCOUNTER — Encounter (HOSPITAL_COMMUNITY): Payer: Self-pay | Admitting: Physical Therapy

## 2019-09-15 DIAGNOSIS — M542 Cervicalgia: Secondary | ICD-10-CM | POA: Diagnosis not present

## 2019-09-15 DIAGNOSIS — R29898 Other symptoms and signs involving the musculoskeletal system: Secondary | ICD-10-CM | POA: Diagnosis not present

## 2019-09-15 DIAGNOSIS — M6281 Muscle weakness (generalized): Secondary | ICD-10-CM | POA: Diagnosis not present

## 2019-09-15 DIAGNOSIS — R293 Abnormal posture: Secondary | ICD-10-CM | POA: Diagnosis not present

## 2019-09-15 NOTE — Therapy (Signed)
[facility] [address] [place], [place], [address] Phone: [phone]   Fax:  [phone]  Physical Therapy Evaluation  Patient Details  Name: Roger Lowe. MRN: [id number] Date of Birth: [birth year] Referring Provider (PT): Melina Schools MD   Encounter Date: 09/15/2019  PT End of Session - 09/15/19 1325    Visit Number  1    Number of Visits  12    Date for PT Re-Evaluation  10/27/19    Authorization Type  Primary: [employer] Medicare HMO Secondary: Medicaid of [place] (yes auth)    Authorization Time Period  09/15/19-10/27/19    Authorization - Visit Number  1    Authorization - Number of Visits  10    PT Start Time  1157    PT Stop Time  1245    PT Time Calculation (min)  48 min    Equipment Utilized During Treatment  Oxygen    Activity Tolerance  Patient tolerated treatment well    Behavior During Therapy  WFL for tasks assessed/performed       Past Medical History:  Diagnosis Date  . Asthma   . Cancer ([place])   . CHF (congestive heart failure) ([place])    a. EF 45-50% by echo in 07/2017 b. EF reduced to 20-25% by repeat echo in 10/2018  . Coronary artery disease    a. cath in 2016 showing mild nonobstructive disease b. cath in 10/2018 showing nonobstructive CAD with 10% LM stenosis, 25% Proximal-LAD, 25% LCx, and mild pulmonary HTN  . Diabetes mellitus without complication ([place])   . Gout   . History of pulmonary embolus (PE) 2016  . Hypertension   . Lung cancer ([place])   . Pneumonia     Past Surgical History:  Procedure Laterality Date  . CERVICAL SPINE SURGERY    . RIGHT/LEFT HEART CATH AND CORONARY ANGIOGRAPHY N/A 11/08/2018   Procedure: RIGHT/LEFT HEART CATH AND CORONARY ANGIOGRAPHY;  Surgeon: [name], MD;  Location: [facility];  Service: Cardiovascular;  Laterality: N/A;    There were no vitals filed for this visit.   Subjective Assessment - 09/15/19 1208    Subjective  Patient 74 y.o. male who presents to  physical therapy for neck pain with radiating symptoms into bilateral UE. He has history of cervical fusion of C5-C6 (referral states C7-T1) in 1995. He noticed symptoms beginning in February of 2020 he noticed that he was dropping things that he was holding in his left UE and then recently after injection in neck about a month ago he started having symptoms in his Right UE. He states he has numbness, tingling, burning, and sharp pains into his arms which are all intermittent. He is not sure what brings on his symptoms. He has decrease in symptoms with pain medicines. His main goal is to decrease symptoms.  Patient has history of stroke affecting L side he states.    Pertinent History  C7-T1 Fusion in 1995, CHF, DM, lung cancer, COPD    Limitations  Other (comment)   lying down   How long can you stand comfortably?  unrestricted    How long can you walk comfortably?  3-4 blocks slowly    Patient Stated Goals  to decrease symptoms    Currently in Pain?  Yes    Pain Score  6     Pain Location  --   neck and L UE   Pain Onset  More than a month ago  Pain Frequency  Intermittent         [facility] PT Assessment - 09/15/19 0001      Assessment   Medical Diagnosis  Neck Pain with radiating pain    Referring Provider (PT)  Melina Schools MD    Onset Date/Surgical Date  10/15/18    Hand Dominance  Right    Next MD Visit  Feb. 16    Prior Therapy  Yes for neck in 1995      Precautions   Precautions  None      Restrictions   Weight Bearing Restrictions  No      Balance Screen   Has the patient fallen in the past 6 months  No    Has the patient had a decrease in activity level because of a fear of falling?   Yes    Is the patient reluctant to leave their home because of a fear of falling?   No      Home Film/video editor residence    Living Arrangements  Other relatives    Available Help at Discharge  Family      Prior Function   Level of Independence  Independent     Vocation  Retired      Associate Professor   Overall Cognitive Status  Within Functional Limits for tasks assessed    Attention  Focused      Observation/Other Assessments   Observations  Patien sits with slouched posture, forward head, rounded shoulders, ambulates without AD uses O2 tank     Focus on Therapeutic Outcomes (FOTO)   52% limited      Sensation   Light Touch  Appears Intact   C3-T1 tested; C5 hypersensitive on LUE     Posture/Postural Control   Posture/Postural Control  Postural limitations    Postural Limitations  Forward head;Rounded Shoulders      ROM / Strength   AROM / PROM / Strength  AROM;Strength      AROM   Overall AROM   Deficits;Due to pain    Overall AROM Comments  extension most painful and limited    AROM Assessment Site  Cervical    Cervical Flexion  50% limited *pain in neck and L shoulder    Cervical Extension  75% limited *pain in neck and L shoulder into bicep    Cervical - Right Side Bend  75% limited *pain in neck and L shoulder    Cervical - Left Side Bend  75% limited *pain in neck and L shoulder    Cervical - Right Rotation  75% limited *pain in neck and L shoulder    Cervical - Left Rotation  75% limited * pain in neck and L shoulder      Strength   Overall Strength Comments  decreased on L side    Strength Assessment Site  Shoulder;Elbow;Wrist;Hand    Right/Left Shoulder  Right;Left    Right Shoulder Flexion  4+/5    Right Shoulder ABduction  4+/5    Left Shoulder Flexion  4/5    Left Shoulder ABduction  4-/5   painful in L UE   Right/Left Elbow  Right;Left    Right Elbow Flexion  5/5    Right Elbow Extension  5/5    Left Elbow Flexion  4+/5    Left Elbow Extension  4/5    Right/Left Wrist  Right;Left    Right Wrist Flexion  5/5    Right Wrist Extension  5/5    Left Wrist Flexion  5/5    Left Wrist Extension  5/5    Right/Left hand  Right;Left    Right Hand Gross Grasp  Functional    Left Hand Gross Grasp  Functional       Palpation   Spinal mobility  assess cervical mobilty next session - unable today due to time constraints    Palpation comment  assess cervical paraspinals and periscapular muscles next session                Objective measurements completed on examination: See above findings.              PT Education - 09/15/19 1323    Education Details  Patient educated on exam findings, POC, scope of PT    Person(s) Educated  Patient    Methods  Explanation    Comprehension  Verbalized understanding       PT Short Term Goals - 09/15/19 1456      PT SHORT TERM GOAL #1   Title  Patient will be independent with HEP in order to improve functional outcomes.    Time  3    Period  Weeks    Status  New    Target Date  10/06/19      PT SHORT TERM GOAL #2   Title  Patient will report 25% improvment in symptoms for improved quality of life.    Time  3    Period  Weeks    Status  New    Target Date  10/06/19        PT Long Term Goals - 09/15/19 1457      PT LONG TERM GOAL #1   Title  Patient will report at least 75% improvement in symptoms in order to improve quality of life.    Time  6    Period  Weeks    Status  New    Target Date  10/27/19      PT LONG TERM GOAL #2   Title  Patient will improve foto score by at least 7 points in order to demonstrate improved quality of life.    Baseline  52% limited    Time  6    Period  Weeks    Status  New    Target Date  10/27/19      PT LONG TERM GOAL #3   Title  Patient will improve cervical ROM by at least 25% in all planes in order to be able to look around while riding in the car.    Time  6    Period  Weeks    Status  New    Target Date  10/27/19      PT LONG TERM GOAL #4   Title  Patient will report centralized cervical symptoms no greater than 2/10 for improved ability to complete all ADL.    Time  6    Period  Weeks    Status  New    Target Date  10/27/19             Plan - 09/15/19 1337    Clinical  Impression Statement  Patient 74 y.o. male who presents to physical therapy for neck pain with radiating symptoms into bilateral UE. He presents with pain limited deficits in cervical and UE strength, ROM, hypomobility, endurance, posture, and functional mobility with ADL. He is having to modify and restrict ADL as indicated by FOTO score as  well as subjective information and objective measures which is affecting overall participation. Symptoms are limiting patient's ability to participate with family and in ADLs. Patient will benefit from skilled physical therapy in order to improve function and reduce impairment.    Personal Factors and Comorbidities  Age;Behavior Pattern;Comorbidity 3+;Time since onset of injury/illness/exacerbation;Past/Current Experience    Comorbidities  C5-C6 per MRN and patient report (C7-T1 per referral) Fusion in 1995, CHF, DM, lung cancer, COPD    Examination-Activity Limitations  Bed Mobility;Bend;Carry;Lift;Locomotion Level;Sit;Sleep    Examination-Participation Restrictions  Church;Cleaning;Driving;Laundry;Meal Prep;Shop;Volunteer;Yard Work    Merchant navy officer  Evolving/Moderate complexity    Clinical Decision Making  Moderate    Rehab Potential  Fair    PT Frequency  2x / week    PT Duration  6 weeks    PT Treatment/Interventions  ADLs/Self Care Home Management;Canalith Repostioning;Electrical Stimulation;Cryotherapy;Moist Heat;Traction;Ultrasound;Functional mobility training;Therapeutic activities;Therapeutic exercise;Neuromuscular re-education;Patient/family education;Manual techniques;Passive range of motion;Dry needling;Spinal Manipulations;Joint Manipulations;Energy conservation;Taping;Vestibular    PT Next Visit Plan  Initiate HEP, assess cervical paraspinals and cervical segmental mobility, possibly begin manual therapy, begin postural strengthening exercises and c/sp mobility exercises    PT Home Exercise Plan  Initiate next session    Consulted  and Agree with Plan of Care  Patient       Patient will benefit from skilled therapeutic intervention in order to improve the following deficits and impairments:  Decreased activity tolerance, Decreased endurance, Decreased mobility, Decreased range of motion, Hypomobility, Decreased strength, Increased muscle spasms, Impaired sensation, Impaired tone, Impaired UE functional use, Improper body mechanics, Postural dysfunction, Pain  Visit Diagnosis: Cervicalgia  Other symptoms and signs involving the musculoskeletal system  Abnormal posture  Muscle weakness (generalized)     Problem List Patient Active Problem List   Diagnosis Date Noted  . On continuous oral anticoagulation 05/10/2019  . Mediastinal adenopathy 05/10/2019  . Hilar adenopathy 05/10/2019  . H/O: lung cancer 05/10/2019  . Chronic hypoxemic respiratory failure ([place]) 05/10/2019  . Pleural effusion, bilateral 05/10/2019  . DNR (do not resuscitate) 05/10/2019  . Acute systolic heart failure ([place])   . Community acquired pneumonia of left lower lobe of lung 10/12/2018  . SIRS (systemic inflammatory response syndrome) ([place]) 10/12/2018  . Uncontrolled hypertension 10/12/2018  . Type 2 diabetes mellitus without complication, with long-term current use of insulin ([place]) 10/12/2018  . Chest pain 10/12/2018  . Prolonged QT interval 10/12/2018    3:03 PM, 09/15/19 Mearl Latin PT, DPT Physical Therapist at [facility] [place], [place], [address] Phone: [phone]   Fax:  [phone]  Name: Roger Lowe. MRN: [id number] Date of Birth: March 12, 1946

## 2019-09-20 ENCOUNTER — Ambulatory Visit (HOSPITAL_COMMUNITY): Payer: Medicare HMO | Admitting: Physical Therapy

## 2019-09-20 ENCOUNTER — Encounter (HOSPITAL_COMMUNITY): Payer: Self-pay | Admitting: Physical Therapy

## 2019-09-20 ENCOUNTER — Other Ambulatory Visit: Payer: Self-pay

## 2019-09-20 DIAGNOSIS — R29898 Other symptoms and signs involving the musculoskeletal system: Secondary | ICD-10-CM | POA: Diagnosis not present

## 2019-09-20 DIAGNOSIS — R293 Abnormal posture: Secondary | ICD-10-CM

## 2019-09-20 DIAGNOSIS — M6281 Muscle weakness (generalized): Secondary | ICD-10-CM

## 2019-09-20 DIAGNOSIS — M542 Cervicalgia: Secondary | ICD-10-CM | POA: Diagnosis not present

## 2019-09-20 NOTE — Therapy (Signed)
Roger Lowe, [place], [address] Phone: [phone]   Fax:  [phone]  Physical Therapy Treatment  Patient Details  Name: Roger Lowe. MRN: [id number] Date of Birth: 07/26/1946 Referring Provider (PT): Melina Schools MD   Encounter Date: 09/20/2019  PT End of Session - 09/20/19 1319    Visit Number  2    Number of Visits  12    Date for PT Re-Evaluation  10/27/19    Authorization Type  Primary: [employer] Medicare HMO Secondary: Medicaid of [place] (yes auth)    Authorization Time Period  09/15/19-10/27/19    Authorization - Visit Number  2    Authorization - Number of Visits  10    PT Start Time  1316    PT Stop Time  1355    PT Time Calculation (min)  39 min    Equipment Utilized During Treatment  Oxygen    Activity Tolerance  Patient tolerated treatment well    Behavior During Therapy  [facility] for tasks assessed/performed       Past Medical History:  Diagnosis Date  . Asthma   . Cancer ([place])   . CHF (congestive heart failure) ([place])    a. EF 45-50% by echo in 07/2017 b. EF reduced to 20-25% by repeat echo in 10/2018  . Coronary artery disease    a. cath in 2016 showing mild nonobstructive disease b. cath in 10/2018 showing nonobstructive CAD with 10% LM stenosis, 25% Proximal-LAD, 25% LCx, and mild pulmonary HTN  . Diabetes mellitus without complication ([place])   . Gout   . History of pulmonary embolus (PE) 2016  . Hypertension   . Lung cancer ([place])   . Pneumonia     Past Surgical History:  Procedure Laterality Date  . CERVICAL SPINE SURGERY    . RIGHT/LEFT HEART CATH AND CORONARY ANGIOGRAPHY N/A 11/08/2018   Procedure: RIGHT/LEFT HEART CATH AND CORONARY ANGIOGRAPHY;  Surgeon: [name], MD;  Location: [place] [facility];  Service: Cardiovascular;  Laterality: N/A;    There were no vitals filed for this visit.  Subjective Assessment - 09/20/19 1319    Subjective  States he has good days and bad days.  Took pain medication before he came so he is feeling pretty good.    Currently in Pain?  Yes    Pain Score  4     Pain Location  Neck    Pain Orientation  Left         [facility] PT Assessment - 09/20/19 0001      Assessment   Medical Diagnosis  Neck Pain with radiating pain    Referring Provider (PT)  Melina Schools MD    Onset Date/Surgical Date  10/15/18    Hand Dominance  Right    Next MD Visit  Feb. 16      Palpation   Spinal mobility  hypomobility noted throughout cervical spine with PAs and medial glides - painful with medial glides especially in lower cervical   thoracic mobility to be assessed in future sessions   Palpation comment  tenderness to palpation bilaterally in suboccipitals, cervical paraspinals and SCM                   [facility] Adult PT Treatment/Exercise - 09/20/19 0001      Exercises   Exercises  Neck      Neck Exercises: Seated   Other Seated Exercise  cerivcal SNAGs with towel for extension -  3x10       Manual Therapy   Manual Therapy  Joint mobilization;Soft tissue mobilization;Manual Traction    Manual therapy comments  all manual interventions performed independently of other interventions    Joint Mobilization  medial glides and PAs to C2-6 bilaterall grade II for pain    Soft tissue mobilization  STM to bilateral cervical paraspinals, SCM, suboccipitals    Manual Traction  cerivcal traction - performed by PT - continuous, x5 30-60 seconds holds - felt good             PT Education - 09/20/19 1409    Education Details  on HEP and on possible benefits of home traction unit (inflatable one)    Person(s) Educated  Patient    Methods  Explanation    Comprehension  Verbalized understanding       PT Short Term Goals - 09/15/19 1456      PT SHORT TERM GOAL #1   Title  Patient will be independent with HEP in order to improve functional outcomes.    Time  3    Period  Weeks    Status  New    Target Date  10/06/19      PT SHORT  TERM GOAL #2   Title  Patient will report 25% improvment in symptoms for improved quality of life.    Time  3    Period  Weeks    Status  New    Target Date  10/06/19        PT Long Term Goals - 09/15/19 1457      PT LONG TERM GOAL #1   Title  Patient will report at least 75% improvement in symptoms in order to improve quality of life.    Time  6    Period  Weeks    Status  New    Target Date  10/27/19      PT LONG TERM GOAL #2   Title  Patient will improve foto score by at least 7 points in order to demonstrate improved quality of life.    Baseline  52% limited    Time  6    Period  Weeks    Status  New    Target Date  10/27/19      PT LONG TERM GOAL #3   Title  Patient will improve cervical ROM by at least 25% in all planes in order to be able to look around while riding in the car.    Time  6    Period  Weeks    Status  New    Target Date  10/27/19      PT LONG TERM GOAL #4   Title  Patient will report centralized cervical symptoms no greater than 2/10 for improved ability to complete all ADL.    Time  6    Period  Weeks    Status  New    Target Date  10/27/19            Plan - 09/20/19 1409    Clinical Impression Statement  Patient tolerated session well with most benefit from cervical traction and SNAG exercise. Discussed possible benefit from home traction unit (inflatable one) and patient reported he has one in his [place] that he can use his monthly insurance money to but it. Will follow up with patient about ordering this device to ensure patient uses it properly. Less pain and stiffness noted next session. Patient would continue  to benefit from skilled physical therapy focusing on improving functional mobility and decreasing pain symptoms.    Personal Factors and Comorbidities  Age;Behavior Pattern;Comorbidity 3+;Time since onset of injury/illness/exacerbation;Past/Current Experience    Comorbidities  C5-C6 per MRN and patient report (C7-T1 per  referral) Fusion in 1995, CHF, DM, lung cancer, COPD    Examination-Activity Limitations  Bed Mobility;Bend;Carry;Lift;Locomotion Level;Sit;Sleep    Examination-Participation Restrictions  Church;Cleaning;Driving;Laundry;Meal Prep;Shop;Volunteer;Yard Work    [employer] officer  Evolving/Moderate complexity    Rehab Potential  Fair    PT Frequency  2x / week    PT Duration  6 weeks    PT Treatment/Interventions  ADLs/Self Care Home Management;Canalith Repostioning;Electrical Stimulation;Cryotherapy;Moist Heat;Traction;Ultrasound;Functional mobility training;Therapeutic activities;Therapeutic exercise;Neuromuscular re-education;Patient/family education;Manual techniques;Passive range of motion;Dry needling;Spinal Manipulations;Joint Manipulations;Energy conservation;Taping;Vestibular    PT Next Visit Plan  continue with cerivcal mobilization, traction, STM and active cerivcal ROM. Investigate/improve thoracic mobility as able    PT Home Exercise Plan  cerivcal SNAGs into extension    Consulted and Agree with Plan of Care  Patient       Patient will benefit from skilled therapeutic intervention in order to improve the following deficits and impairments:  Decreased activity tolerance, Decreased endurance, Decreased mobility, Decreased range of motion, Hypomobility, Decreased strength, Increased muscle spasms, Impaired sensation, Impaired tone, Impaired UE functional use, Improper body mechanics, Postural dysfunction, Pain  Visit Diagnosis: Cervicalgia  Other symptoms and signs involving the musculoskeletal system  Abnormal posture  Muscle weakness (generalized)     Problem List Patient Active Problem List   Diagnosis Date Noted  . On continuous oral anticoagulation 05/10/2019  . Mediastinal adenopathy 05/10/2019  . Hilar adenopathy 05/10/2019  . H/O: lung cancer 05/10/2019  . Chronic hypoxemic respiratory failure ([place]) 05/10/2019  . Pleural effusion, bilateral  05/10/2019  . DNR (do not resuscitate) 05/10/2019  . Acute systolic heart failure ([place])   . Community acquired pneumonia of left lower lobe of lung 10/12/2018  . SIRS (systemic inflammatory response syndrome) ([place]) 10/12/2018  . Uncontrolled hypertension 10/12/2018  . Type 2 diabetes mellitus without complication, with long-term current use of insulin ([place]) 10/12/2018  . Chest pain 10/12/2018  . Prolonged QT interval 10/12/2018    2:15 PM, 09/20/19 Jerene Pitch, DPT Physical Therapy with [facility]  [phone] office  [place] [address] [place], [place], [address] Phone: [phone]   Fax:  [phone]  Name: Roger Lowe. MRN: [id number] Date of Birth: [birth year]

## 2019-09-21 ENCOUNTER — Other Ambulatory Visit: Payer: Self-pay

## 2019-09-21 ENCOUNTER — Encounter (HOSPITAL_COMMUNITY): Payer: Self-pay | Admitting: Physical Therapy

## 2019-09-21 ENCOUNTER — Ambulatory Visit (HOSPITAL_COMMUNITY): Payer: Medicare HMO | Admitting: Physical Therapy

## 2019-09-21 DIAGNOSIS — R293 Abnormal posture: Secondary | ICD-10-CM

## 2019-09-21 DIAGNOSIS — M542 Cervicalgia: Secondary | ICD-10-CM | POA: Diagnosis not present

## 2019-09-21 DIAGNOSIS — R29898 Other symptoms and signs involving the musculoskeletal system: Secondary | ICD-10-CM | POA: Diagnosis not present

## 2019-09-21 DIAGNOSIS — M6281 Muscle weakness (generalized): Secondary | ICD-10-CM

## 2019-09-21 NOTE — Therapy (Signed)
[place], [place], [address] Phone: [phone]   Fax:  [phone]  Physical Therapy Treatment  Patient Details  Name: Roger Lowe. MRN: [id number] Date of Birth: 09/18/1945 Referring Provider (PT): Melina Schools MD   Encounter Date: 09/21/2019  PT End of Session - 09/21/19 1125    Visit Number  3    Number of Visits  12    Date for PT Re-Evaluation  10/27/19    Authorization Type  Primary: [employer] Medicare HMO Secondary: Medicaid of [place] (yes auth)    Authorization Time Period  09/15/19-10/27/19    Authorization - Visit Number  3    Authorization - Number of Visits  10    PT Start Time  1117    PT Stop Time  1157    PT Time Calculation (min)  40 min    Equipment Utilized During Treatment  Oxygen    Activity Tolerance  Patient tolerated treatment well    Behavior During Therapy  [facility] for tasks assessed/performed       Past Medical History:  Diagnosis Date  . Asthma   . Cancer ([place])   . CHF (congestive heart failure) ([place])    a. EF 45-50% by echo in 07/2017 b. EF reduced to 20-25% by repeat echo in 10/2018  . Coronary artery disease    a. cath in 2016 showing mild nonobstructive disease b. cath in 10/2018 showing nonobstructive CAD with 10% LM stenosis, 25% Proximal-LAD, 25% LCx, and mild pulmonary HTN  . Diabetes mellitus without complication ([place])   . Gout   . History of pulmonary embolus (PE) 2016  . Hypertension   . Lung cancer ([place])   . Pneumonia     Past Surgical History:  Procedure Laterality Date  . CERVICAL SPINE SURGERY    . RIGHT/LEFT HEART CATH AND CORONARY ANGIOGRAPHY N/A 11/08/2018   Procedure: RIGHT/LEFT HEART CATH AND CORONARY ANGIOGRAPHY;  Surgeon: [name], MD;  Location: [place] [facility];  Service: Cardiovascular;  Laterality: N/A;    There were no vitals filed for this visit.  Subjective Assessment - 09/21/19 1118    Subjective  Patient states that after yesterday he  ached a little bit but it went away pretty quick. He was able to perform some exercises last night. He has most difficulty looking up. Pain mostly on L side.    Currently in Pain?  Yes    Pain Score  4     Pain Location  Neck                       [facility] Adult PT Treatment/Exercise - 09/21/19 0001      Neck Exercises: Seated   Other Seated Exercise  cerivcal SNAGs with towel for extension - 3x10       Neck Exercises: Supine   Other Supine Exercise  cervical retraction in supine 2x10      Manual Therapy   Manual Therapy  Joint mobilization;Soft tissue mobilization;Manual Traction    Manual therapy comments  all manual interventions performed independently of other interventions    Joint Mobilization  medial glides and PAs to C2-6 bilaterall grade II-III for pain    Soft tissue mobilization  STM to bilateral cervical paraspinals, SCM, suboccipitals    Manual Traction  cerivcal traction - performed by PT - continuous, x5 30-60 seconds holds - felt good  PT Education - 09/21/19 1124    Education Details  Patient educated on HEP    Person(s) Educated  Patient    Methods  Explanation    Comprehension  Verbalized understanding       PT Short Term Goals - 09/21/19 1213      PT SHORT TERM GOAL #1   Title  Patient will be independent with HEP in order to improve functional outcomes.    Time  3    Period  Weeks    Status  On-going    Target Date  10/06/19      PT SHORT TERM GOAL #2   Title  Patient will report 25% improvment in symptoms for improved quality of life.    Time  3    Period  Weeks    Status  On-going    Target Date  10/06/19        PT Long Term Goals - 09/21/19 1213      PT LONG TERM GOAL #1   Title  Patient will report at least 75% improvement in symptoms in order to improve quality of life.    Time  6    Period  Weeks    Status  On-going    Target Date  10/27/19      PT LONG TERM GOAL #2   Title  Patient will improve foto  score by at least 7 points in order to demonstrate improved quality of life.    Baseline  52% limited    Time  6    Period  Weeks    Status  On-going    Target Date  10/27/19      PT LONG TERM GOAL #3   Title  Patient will improve cervical ROM by at least 25% in all planes in order to be able to look around while riding in the car.    Time  6    Period  Weeks    Status  On-going    Target Date  10/27/19      PT LONG TERM GOAL #4   Title  Patient will report centralized cervical symptoms no greater than 2/10 for improved ability to complete all ADL.    Time  6    Period  Weeks    Status  On-going    Target Date  10/27/19            Plan - 09/21/19 1207    Clinical Impression Statement  Patient feels reduction in symptoms following manual therapy intervention. He experiences symptoms in his R shoulder with cervical mobilizations and traction which decrease with repetition. Patient has reduction in stiffness and symptoms with cervical retractions and requires some verbal and tactile cueing for proper mechanics. He demonstrates proper form when performing previously given SNAG exercises. Patient will continue to benefit from skilled physical therapy in order to improve function and reduce impairment.    Personal Factors and Comorbidities  Age;Behavior Pattern;Comorbidity 3+;Time since onset of injury/illness/exacerbation;Past/Current Experience    Comorbidities  C5-C6 per MRN and patient report (C7-T1 per referral) Fusion in 1995, CHF, DM, lung cancer, COPD    Examination-Activity Limitations  Bed Mobility;Bend;Carry;Lift;Locomotion Level;Sit;Sleep    Examination-Participation Restrictions  Church;Cleaning;Driving;Laundry;Meal Prep;Shop;Volunteer;Yard Work    [employer] officer  Evolving/Moderate complexity    Rehab Potential  Fair    PT Frequency  2x / week    PT Duration  6 weeks    PT Treatment/Interventions  ADLs/Self Care Home Management;Canalith  Repostioning;Electrical Stimulation;Cryotherapy;Moist  Heat;Traction;Ultrasound;Functional mobility training;Therapeutic activities;Therapeutic exercise;Neuromuscular re-education;Patient/family education;Manual techniques;Passive range of motion;Dry needling;Spinal Manipulations;Joint Manipulations;Energy conservation;Taping;Vestibular    PT Next Visit Plan  continue with cerivcal mobilization, traction, STM and active cerivcal ROM. Investigate/improve thoracic mobility as able; possibly add SNAGs with rotation, UT stretch, thoracic mobility, postural strengthening as able    PT Home Exercise Plan  cerivcal SNAGs into extension 09/22/19: cervical retraction in supine 2x10    Consulted and Agree with Plan of Care  Patient       Patient will benefit from skilled therapeutic intervention in order to improve the following deficits and impairments:  Decreased activity tolerance, Decreased endurance, Decreased mobility, Decreased range of motion, Hypomobility, Decreased strength, Increased muscle spasms, Impaired sensation, Impaired tone, Impaired UE functional use, Improper body mechanics, Postural dysfunction, Pain  Visit Diagnosis: Cervicalgia  Other symptoms and signs involving the musculoskeletal system  Abnormal posture  Muscle weakness (generalized)     Problem List Patient Active Problem List   Diagnosis Date Noted  . On continuous oral anticoagulation 05/10/2019  . Mediastinal adenopathy 05/10/2019  . Hilar adenopathy 05/10/2019  . H/O: lung cancer 05/10/2019  . Chronic hypoxemic respiratory failure ([place]) 05/10/2019  . Pleural effusion, bilateral 05/10/2019  . DNR (do not resuscitate) 05/10/2019  . Acute systolic heart failure ([place])   . Community acquired pneumonia of left lower lobe of lung 10/12/2018  . SIRS (systemic inflammatory response syndrome) ([place]) 10/12/2018  . Uncontrolled hypertension 10/12/2018  . Type 2 diabetes mellitus without complication, with long-term current  use of insulin ([place]) 10/12/2018  . Chest pain 10/12/2018  . Prolonged QT interval 10/12/2018    12:15 PM, 09/21/19 Mearl Latin PT, DPT Physical Therapist at [place], [place], [address] Phone: [phone]   Fax:  [phone]  Name: Roger Lowe. MRN: [id number] Date of Birth: 1946-02-03

## 2019-09-22 ENCOUNTER — Encounter (HOSPITAL_COMMUNITY): Payer: Medicare HMO | Admitting: Physical Therapy

## 2019-09-27 ENCOUNTER — Telehealth (HOSPITAL_COMMUNITY): Payer: Self-pay | Admitting: Physical Therapy

## 2019-09-27 ENCOUNTER — Ambulatory Visit (HOSPITAL_COMMUNITY): Payer: Medicare HMO | Admitting: Physical Therapy

## 2019-09-27 DIAGNOSIS — M961 Postlaminectomy syndrome, not elsewhere classified: Secondary | ICD-10-CM | POA: Diagnosis not present

## 2019-09-27 DIAGNOSIS — D6869 Other thrombophilia: Secondary | ICD-10-CM | POA: Diagnosis not present

## 2019-09-27 DIAGNOSIS — Z86718 Personal history of other venous thrombosis and embolism: Secondary | ICD-10-CM | POA: Diagnosis not present

## 2019-09-27 NOTE — Telephone Encounter (Signed)
pt called to cancel his appt today due to he has another doctor's appt somewhere else.

## 2019-09-29 ENCOUNTER — Encounter (HOSPITAL_COMMUNITY): Payer: Self-pay | Admitting: Physical Therapy

## 2019-09-29 ENCOUNTER — Ambulatory Visit (HOSPITAL_COMMUNITY): Payer: Medicare HMO | Admitting: Physical Therapy

## 2019-09-29 ENCOUNTER — Other Ambulatory Visit: Payer: Self-pay

## 2019-09-29 DIAGNOSIS — R29898 Other symptoms and signs involving the musculoskeletal system: Secondary | ICD-10-CM | POA: Diagnosis not present

## 2019-09-29 DIAGNOSIS — M6281 Muscle weakness (generalized): Secondary | ICD-10-CM | POA: Diagnosis not present

## 2019-09-29 DIAGNOSIS — M542 Cervicalgia: Secondary | ICD-10-CM | POA: Diagnosis not present

## 2019-09-29 DIAGNOSIS — R293 Abnormal posture: Secondary | ICD-10-CM | POA: Diagnosis not present

## 2019-09-29 NOTE — Therapy (Signed)
[place], [place], [address] Phone: [phone]   Fax:  [phone]  Physical Therapy Treatment  Patient Details  Name: Roger Lowe. MRN: [id number] Date of Birth: 24-Dec-1945 Referring Provider (PT): Melina Schools MD   Encounter Date: 09/29/2019  PT End of Session - 09/29/19 1401    Visit Number  4    Number of Visits  12    Date for PT Re-Evaluation  10/27/19    Authorization Type  Primary: [employer] Medicare HMO Secondary: Medicaid of [place] (yes auth)    Authorization Time Period  09/15/19-10/27/19    Authorization - Visit Number  4    Authorization - Number of Visits  10    PT Start Time  0272    PT Stop Time  1400    PT Time Calculation (min)  1363 min    Equipment Utilized During Treatment  Oxygen    Activity Tolerance  Patient tolerated treatment well    Behavior During Therapy  [facility] for tasks assessed/performed       Past Medical History:  Diagnosis Date  . Asthma   . Cancer ([place])   . CHF (congestive heart failure) ([place])    a. EF 45-50% by echo in 07/2017 b. EF reduced to 20-25% by repeat echo in 10/2018  . Coronary artery disease    a. cath in 2016 showing mild nonobstructive disease b. cath in 10/2018 showing nonobstructive CAD with 10% LM stenosis, 25% Proximal-LAD, 25% LCx, and mild pulmonary HTN  . Diabetes mellitus without complication ([place])   . Gout   . History of pulmonary embolus (PE) 2016  . Hypertension   . Lung cancer ([place])   . Pneumonia     Past Surgical History:  Procedure Laterality Date  . CERVICAL SPINE SURGERY    . RIGHT/LEFT HEART CATH AND CORONARY ANGIOGRAPHY N/A 11/08/2018   Procedure: RIGHT/LEFT HEART CATH AND CORONARY ANGIOGRAPHY;  Surgeon: [name], MD;  Location: [facility];  Service: Cardiovascular;  Laterality: N/A;    There were no vitals filed for this visit.  Subjective Assessment - 09/29/19 1316    Subjective  Roger Lowe states that he has been  doing less and less of his exercies due to the pain increasing.  He has opted to have surgery.  He is going to complete therapy and try to get one more injection prior to the surgery.    Pertinent History  C7-T1 Fusion in 1995, CHF, DM, lung cancer, COPD    Limitations  Other (comment)   lying down   How long can you stand comfortably?  unrestricted    How long can you walk comfortably?  3-4 blocks slowly    Patient Stated Goals  to decrease symptoms    Currently in Pain?  Yes    Pain Score  6     Pain Location  Neck    Pain Orientation  Lower    Pain Descriptors / Indicators  Aching    Pain Type  Chronic pain    Pain Radiating Towards  B UE    Pain Onset  More than a month ago    Pain Frequency  Constant    Aggravating Factors   not sure    Pain Relieving Factors  not sure    Effect of Pain on Daily Activities  limits  [facility] Adult PT Treatment/Exercise - 09/29/19 0001      Exercises   Exercises  Neck      Neck Exercises: Seated   Neck Retraction  5 reps    Neck Retraction Limitations  scapular retraction x 10    Other Seated Exercise  3 D cervical and thoracic excursion exercises  x 3       Neck Exercises: Supine   Neck Retraction  10 reps    Neck Retraction Limitations  scapular retraction x 10       Manual Therapy   Manual Therapy  Joint mobilization;Soft tissue mobilization;Passive ROM;Manual Traction    Manual therapy comments  all manual interventions performed independently of other interventions    Joint Mobilization  medial glides and PAs to C2-6 bilaterall grade II-III for pain    Soft tissue mobilization  STM to bilateral cervical paraspinals, SCM, suboccipitals    Passive ROM  all directions     Manual Traction  cerivcal traction - performed by PT - continuous, x5 30-60 seconds holds - felt good             PT Education - 09/29/19 1400    Education Details  cervical excursion exercises    Person(s) Educated  Patient     Methods  Explanation       PT Short Term Goals - 09/21/19 1213      PT SHORT TERM GOAL #1   Title  Patient will be independent with HEP in order to improve functional outcomes.    Time  3    Period  Weeks    Status  On-going    Target Date  10/06/19      PT SHORT TERM GOAL #2   Title  Patient will report 25% improvment in symptoms for improved quality of life.    Time  3    Period  Weeks    Status  On-going    Target Date  10/06/19        PT Long Term Goals - 09/21/19 1213      PT LONG TERM GOAL #1   Title  Patient will report at least 75% improvement in symptoms in order to improve quality of life.    Time  6    Period  Weeks    Status  On-going    Target Date  10/27/19      PT LONG TERM GOAL #2   Title  Patient will improve foto score by at least 7 points in order to demonstrate improved quality of life.    Baseline  52% limited    Time  6    Period  Weeks    Status  On-going    Target Date  10/27/19      PT LONG TERM GOAL #3   Title  Patient will improve cervical ROM by at least 25% in all planes in order to be able to look around while riding in the car.    Time  6    Period  Weeks    Status  On-going    Target Date  10/27/19      PT LONG TERM GOAL #4   Title  Patient will report centralized cervical symptoms no greater than 2/10 for improved ability to complete all ADL.    Time  6    Period  Weeks    Status  On-going    Target Date  10/27/19  Plan - 09/29/19 1404    Personal Factors and Comorbidities  Age;Behavior Pattern;Comorbidity 3+;Time since onset of injury/illness/exacerbation;Past/Current Experience    Comorbidities  C5-C6 per MRN and patient report (C7-T1 per referral) Fusion in 1995, CHF, DM, lung cancer, COPD    Examination-Activity Limitations  Bed Mobility;Bend;Carry;Lift;Locomotion Level;Sit;Sleep    Examination-Participation Restrictions  Church;Cleaning;Driving;Laundry;Meal Prep;Shop;Volunteer;Yard Work     [employer] officer  Evolving/Moderate complexity    Rehab Potential  Fair    PT Frequency  2x / week    PT Duration  6 weeks    PT Treatment/Interventions  ADLs/Self Care Home Management;Canalith Repostioning;Electrical Stimulation;Cryotherapy;Moist Heat;Traction;Ultrasound;Functional mobility training;Therapeutic activities;Therapeutic exercise;Neuromuscular re-education;Patient/family education;Manual techniques;Passive range of motion;Dry needling;Spinal Manipulations;Joint Manipulations;Energy conservation;Taping;Vestibular    PT Next Visit Plan  continue with cerivcal mobilization, traction, STM and active cerivcal ROM. Investigate/improve thoracic mobility as able; possibly add SNAGs with rotation, UT stretch, thoracic mobility, postural strengthening as able    PT Home Exercise Plan  cerivcal SNAGs into extension 09/22/19: cervical retraction in supine 2x10; cervical excursion exercises.    Consulted and Agree with Plan of Care  Patient       Patient will benefit from skilled therapeutic intervention in order to improve the following deficits and impairments:  Decreased activity tolerance, Decreased endurance, Decreased mobility, Decreased range of motion, Hypomobility, Decreased strength, Increased muscle spasms, Impaired sensation, Impaired tone, Impaired UE functional use, Improper body mechanics, Postural dysfunction, Pain  Visit Diagnosis: Cervicalgia  Other symptoms and signs involving the musculoskeletal system  Abnormal posture  Muscle weakness (generalized)     Problem List Patient Active Problem List   Diagnosis Date Noted  . On continuous oral anticoagulation 05/10/2019  . Mediastinal adenopathy 05/10/2019  . Hilar adenopathy 05/10/2019  . H/O: lung cancer 05/10/2019  . Chronic hypoxemic respiratory failure ([place]) 05/10/2019  . Pleural effusion, bilateral 05/10/2019  . DNR (do not resuscitate) 05/10/2019  . Acute systolic heart failure ([place])   .  Community acquired pneumonia of left lower lobe of lung 10/12/2018  . SIRS (systemic inflammatory response syndrome) ([place]) 10/12/2018  . Uncontrolled hypertension 10/12/2018  . Type 2 diabetes mellitus without complication, with long-term current use of insulin ([place]) 10/12/2018  . Chest pain 10/12/2018  . Prolonged QT interval 10/12/2018    Rayetta Humphrey, PT CLT [phone] 09/29/2019, 2:05 PM  [place] [address] [place], [place], [address] Phone: [phone]   Fax:  [phone]  Name: Roger Lowe. MRN: [id number] Date of Birth: 06/29/1946

## 2019-10-03 ENCOUNTER — Other Ambulatory Visit: Payer: Self-pay

## 2019-10-03 ENCOUNTER — Encounter (HOSPITAL_COMMUNITY): Payer: Self-pay | Admitting: Physical Therapy

## 2019-10-03 ENCOUNTER — Ambulatory Visit (HOSPITAL_COMMUNITY): Payer: Medicare HMO | Attending: Orthopedic Surgery | Admitting: Physical Therapy

## 2019-10-03 DIAGNOSIS — R293 Abnormal posture: Secondary | ICD-10-CM | POA: Diagnosis not present

## 2019-10-03 DIAGNOSIS — M6281 Muscle weakness (generalized): Secondary | ICD-10-CM | POA: Diagnosis not present

## 2019-10-03 DIAGNOSIS — M542 Cervicalgia: Secondary | ICD-10-CM | POA: Diagnosis not present

## 2019-10-03 DIAGNOSIS — R29898 Other symptoms and signs involving the musculoskeletal system: Secondary | ICD-10-CM | POA: Insufficient documentation

## 2019-10-03 NOTE — Therapy (Signed)
[place], [place], [address] Phone: [phone]   Fax:  [phone]  Physical Therapy Treatment  Patient Details  Name: Roger Lowe. MRN: [id number] Date of Birth: [birth year] Referring Provider (PT): Melina Schools MD   Encounter Date: 10/03/2019  PT End of Session - 10/03/19 1536    Visit Number  5    Number of Visits  12    Date for PT Re-Evaluation  10/27/19    Authorization Type  Primary: [employer] Medicare HMO Secondary: Medicaid of [place] (yes auth)    Authorization Time Period  09/15/19-10/27/19    Authorization - Visit Number  5    Authorization - Number of Visits  10    PT Start Time  0350    PT Stop Time  1600    PT Time Calculation (min)  45 min    Equipment Utilized During Treatment  Oxygen    Activity Tolerance  Patient tolerated treatment well    Behavior During Therapy  [facility] for tasks assessed/performed       Past Medical History:  Diagnosis Date  . Asthma   . Cancer ([place])   . CHF (congestive heart failure) ([place])    a. EF 45-50% by echo in 07/2017 b. EF reduced to 20-25% by repeat echo in 10/2018  . Coronary artery disease    a. cath in 2016 showing mild nonobstructive disease b. cath in 10/2018 showing nonobstructive CAD with 10% LM stenosis, 25% Proximal-LAD, 25% LCx, and mild pulmonary HTN  . Diabetes mellitus without complication ([place])   . Gout   . History of pulmonary embolus (PE) 2016  . Hypertension   . Lung cancer ([place])   . Pneumonia     Past Surgical History:  Procedure Laterality Date  . CERVICAL SPINE SURGERY    . RIGHT/LEFT HEART CATH AND CORONARY ANGIOGRAPHY N/A 11/08/2018   Procedure: RIGHT/LEFT HEART CATH AND CORONARY ANGIOGRAPHY;  Surgeon: [name], MD;  Location: [facility];  Service: Cardiovascular;  Laterality: N/A;    There were no vitals filed for this visit.  Subjective Assessment - 10/03/19 1516    Subjective  Patient reports that the SNAG exercise  has been making him worse. He is going to be getting another shot in his neck. He feels the one lying down has not been bad. He wants to see how his neck does after the shot to see if he has to have surgery. He has been having more pain following the exercises.    Pertinent History  C7-T1 Fusion in 1995, CHF, DM, lung cancer, COPD    Limitations  Other (comment)   lying down   How long can you stand comfortably?  unrestricted    How long can you walk comfortably?  3-4 blocks slowly    Patient Stated Goals  to decrease symptoms    Currently in Pain?  Yes    Pain Score  4     Pain Onset  More than a month ago                       [facility] Adult PT Treatment/Exercise - 10/03/19 0001      Neck Exercises: Supine   Neck Retraction  10 reps    Neck Retraction Limitations  scapular retraction x 10; 4 sets      Manual Therapy   Manual Therapy  Joint mobilization;Soft tissue mobilization;Passive ROM;Manual Traction    Manual  therapy comments  all manual interventions performed independently of other interventions    Joint Mobilization  medial glides and PAs to C2-6 bilaterall grade II-III for pain    Soft tissue mobilization  STM to bilateral cervical paraspinals, SCM, suboccipitals    Manual Traction  cerivcal traction - performed by PT - continuous, x5 30-60 seconds holds - felt good             PT Education - 10/03/19 1535    Education Details  Patient educated on HEP    Person(s) Educated  Patient    Methods  Explanation    Comprehension  Verbalized understanding       PT Short Term Goals - 09/21/19 1213      PT SHORT TERM GOAL #1   Title  Patient will be independent with HEP in order to improve functional outcomes.    Time  3    Period  Weeks    Status  On-going    Target Date  10/06/19      PT SHORT TERM GOAL #2   Title  Patient will report 25% improvment in symptoms for improved quality of life.    Time  3    Period  Weeks    Status  On-going     Target Date  10/06/19        PT Long Term Goals - 09/21/19 1213      PT LONG TERM GOAL #1   Title  Patient will report at least 75% improvement in symptoms in order to improve quality of life.    Time  6    Period  Weeks    Status  On-going    Target Date  10/27/19      PT LONG TERM GOAL #2   Title  Patient will improve foto score by at least 7 points in order to demonstrate improved quality of life.    Baseline  52% limited    Time  6    Period  Weeks    Status  On-going    Target Date  10/27/19      PT LONG TERM GOAL #3   Title  Patient will improve cervical ROM by at least 25% in all planes in order to be able to look around while riding in the car.    Time  6    Period  Weeks    Status  On-going    Target Date  10/27/19      PT LONG TERM GOAL #4   Title  Patient will report centralized cervical symptoms no greater than 2/10 for improved ability to complete all ADL.    Time  6    Period  Weeks    Status  On-going    Target Date  10/27/19            Plan - 10/03/19 1607    Clinical Impression Statement  Upon assessing how patient was performing SNAG and cervical excursion exercise, he was not performing with correct posture and body mechanics which was likely aggravating symptoms and they were discontinued due to difficulty completing appropriately. He required min verbal and tactile cueing to complete supine cervical retractions but he stated symptom reduction when completing. Patient educated on frequency and mechanics of how to complete cervical retractions for HEP. He tolerates manual therapy well and there is a decrease in tissue tension following. Patient experiences reduction in symptoms following. Patient will continue to benefit from skilled physical therapy for improving  function and reducing impairment.    Personal Factors and Comorbidities  Age;Behavior Pattern;Comorbidity 3+;Time since onset of injury/illness/exacerbation;Past/Current Experience     Comorbidities  C5-C6 per MRN and patient report (C7-T1 per referral) Fusion in 1995, CHF, DM, lung cancer, COPD    Examination-Activity Limitations  Bed Mobility;Bend;Carry;Lift;Locomotion Level;Sit;Sleep    Examination-Participation Restrictions  Church;Cleaning;Driving;Laundry;Meal Prep;Shop;Volunteer;Yard Work    [employer] officer  Evolving/Moderate complexity    Rehab Potential  Fair    PT Frequency  2x / week    PT Duration  6 weeks    PT Treatment/Interventions  ADLs/Self Care Home Management;Canalith Repostioning;Electrical Stimulation;Cryotherapy;Moist Heat;Traction;Ultrasound;Functional mobility training;Therapeutic activities;Therapeutic exercise;Neuromuscular re-education;Patient/family education;Manual techniques;Passive range of motion;Dry needling;Spinal Manipulations;Joint Manipulations;Energy conservation;Taping;Vestibular    PT Next Visit Plan  continue with cerivcal mobilization, traction, STM and active cerivcal ROM. Investigate/improve thoracic mobility as able; possibly add SNAGs with rotation, UT stretch, thoracic mobility, postural strengthening as able    PT Home Exercise Plan  cerivcal SNAGs into extension(discontinued for now) 09/22/19: cervical retraction in supine 2x10; cervical excursion exercises. (discontinued for now)    Consulted and Agree with Plan of Care  Patient       Patient will benefit from skilled therapeutic intervention in order to improve the following deficits and impairments:  Decreased activity tolerance, Decreased endurance, Decreased mobility, Decreased range of motion, Hypomobility, Decreased strength, Increased muscle spasms, Impaired sensation, Impaired tone, Impaired UE functional use, Improper body mechanics, Postural dysfunction, Pain  Visit Diagnosis: Cervicalgia  Other symptoms and signs involving the musculoskeletal system  Abnormal posture  Muscle weakness (generalized)     Problem List Patient Active Problem List    Diagnosis Date Noted  . On continuous oral anticoagulation 05/10/2019  . Mediastinal adenopathy 05/10/2019  . Hilar adenopathy 05/10/2019  . H/O: lung cancer 05/10/2019  . Chronic hypoxemic respiratory failure ([place]) 05/10/2019  . Pleural effusion, bilateral 05/10/2019  . DNR (do not resuscitate) 05/10/2019  . Acute systolic heart failure ([place])   . Community acquired pneumonia of left lower lobe of lung 10/12/2018  . SIRS (systemic inflammatory response syndrome) ([place]) 10/12/2018  . Uncontrolled hypertension 10/12/2018  . Type 2 diabetes mellitus without complication, with long-term current use of insulin ([place]) 10/12/2018  . Chest pain 10/12/2018  . Prolonged QT interval 10/12/2018     4:15 PM, 10/03/19 Mearl Latin PT, DPT Physical Therapist at [place], [place], [address] Phone: [phone]   Fax:  [phone]  Name: Roger Lowe. MRN: [id number] Date of Birth: [birth year]

## 2019-10-04 ENCOUNTER — Encounter (HOSPITAL_COMMUNITY): Payer: Medicare HMO | Admitting: Physical Therapy

## 2019-10-04 ENCOUNTER — Ambulatory Visit (HOSPITAL_COMMUNITY)
Admission: RE | Admit: 2019-10-04 | Discharge: 2019-10-04 | Disposition: A | Discharge status: Home / Self Care | Payer: Medicare HMO | Source: Ambulatory Visit | Attending: Student | Admitting: Student

## 2019-10-04 DIAGNOSIS — I5042 Chronic combined systolic (congestive) and diastolic (congestive) heart failure: Secondary | ICD-10-CM | POA: Insufficient documentation

## 2019-10-04 NOTE — Progress Notes (Signed)
*  PRELIMINARY RESULTS* Echocardiogram 2D Echocardiogram has been performed.  Roger Lowe 10/04/2019, 12:16 PM

## 2019-10-05 ENCOUNTER — Encounter (HOSPITAL_COMMUNITY): Payer: Self-pay | Admitting: Physical Therapy

## 2019-10-05 ENCOUNTER — Encounter: Payer: Self-pay | Admitting: Pulmonary Disease

## 2019-10-05 ENCOUNTER — Other Ambulatory Visit: Payer: Self-pay

## 2019-10-05 ENCOUNTER — Ambulatory Visit (HOSPITAL_COMMUNITY): Payer: Medicare HMO | Admitting: Physical Therapy

## 2019-10-05 ENCOUNTER — Ambulatory Visit (INDEPENDENT_AMBULATORY_CARE_PROVIDER_SITE_OTHER): Payer: Medicare HMO | Admitting: Pulmonary Disease

## 2019-10-05 VITALS — BP 122/76 | HR 100 | Temp 98.1°F | Ht 72.0 in | Wt 225.0 lb

## 2019-10-05 DIAGNOSIS — Z7185 Encounter for immunization safety counseling: Secondary | ICD-10-CM

## 2019-10-05 DIAGNOSIS — R293 Abnormal posture: Secondary | ICD-10-CM

## 2019-10-05 DIAGNOSIS — M6281 Muscle weakness (generalized): Secondary | ICD-10-CM

## 2019-10-05 DIAGNOSIS — J9611 Chronic respiratory failure with hypoxia: Secondary | ICD-10-CM

## 2019-10-05 DIAGNOSIS — Z7189 Other specified counseling: Secondary | ICD-10-CM | POA: Diagnosis not present

## 2019-10-05 DIAGNOSIS — M542 Cervicalgia: Secondary | ICD-10-CM | POA: Diagnosis not present

## 2019-10-05 DIAGNOSIS — Z66 Do not resuscitate: Secondary | ICD-10-CM | POA: Diagnosis not present

## 2019-10-05 DIAGNOSIS — J449 Chronic obstructive pulmonary disease, unspecified: Secondary | ICD-10-CM | POA: Diagnosis not present

## 2019-10-05 DIAGNOSIS — R29898 Other symptoms and signs involving the musculoskeletal system: Secondary | ICD-10-CM

## 2019-10-05 DIAGNOSIS — Z8701 Personal history of pneumonia (recurrent): Secondary | ICD-10-CM | POA: Diagnosis not present

## 2019-10-05 NOTE — Therapy (Signed)
[place], [place], [address] Phone: [phone]   Fax:  [phone]  Physical Therapy Treatment  Patient Details  Name: Roger Lowe. MRN: [id number] Date of Birth: 1945-12-07 Referring Provider (PT): Melina Schools MD   Encounter Date: 10/05/2019  PT End of Session - 10/05/19 1040    Visit Number  6    Number of Visits  12    Date for PT Re-Evaluation  10/27/19    Authorization Type  Primary: [employer] Medicare HMO Secondary: Medicaid of [place] (yes auth)    Authorization Time Period  09/15/19-10/27/19    Authorization - Visit Number  6    Authorization - Number of Visits  10    PT Start Time  1034    PT Stop Time  1113    PT Time Calculation (min)  39 min    Equipment Utilized During Treatment  Oxygen    Activity Tolerance  Patient tolerated treatment well    Behavior During Therapy  [facility] for tasks assessed/performed       Past Medical History:  Diagnosis Date  . Asthma   . Cancer ([place])   . CHF (congestive heart failure) ([place])    a. EF 45-50% by echo in 07/2017 b. EF reduced to 20-25% by repeat echo in 10/2018  . Coronary artery disease    a. cath in 2016 showing mild nonobstructive disease b. cath in 10/2018 showing nonobstructive CAD with 10% LM stenosis, 25% Proximal-LAD, 25% LCx, and mild pulmonary HTN  . Diabetes mellitus without complication ([place])   . Gout   . History of pulmonary embolus (PE) 2016  . Hypertension   . Lung cancer ([place])   . Pneumonia     Past Surgical History:  Procedure Laterality Date  . CERVICAL SPINE SURGERY    . RIGHT/LEFT HEART CATH AND CORONARY ANGIOGRAPHY N/A 11/08/2018   Procedure: RIGHT/LEFT HEART CATH AND CORONARY ANGIOGRAPHY;  Surgeon: [name], MD;  Location: [place] [facility] [facility];  Service: Cardiovascular;  Laterality: N/A;    There were no vitals filed for this visit.  Subjective Assessment - 10/05/19 1034    Subjective  Patient reports he thinks he has been  over doing the chin tucks because he felt sore. He did them 8x yesterday and 2x this morning. He had to take a pain pill this morning. He is going to decrease the amount of how many reps of exercises he is doing.    Pertinent History  C7-T1 Fusion in 1995, CHF, DM, lung cancer, COPD    Limitations  Other (comment)   lying down   How long can you stand comfortably?  unrestricted    How long can you walk comfortably?  3-4 blocks slowly    Patient Stated Goals  to decrease symptoms    Currently in Pain?  Yes    Pain Score  4     Pain Location  Neck    Pain Onset  More than a month ago                       [facility] Adult PT Treatment/Exercise - 10/05/19 0001      Neck Exercises: Seated   Neck Retraction  10 reps;5 secs    Neck Retraction Limitations  scapular retractions      Neck Exercises: Supine   Neck Retraction  10 reps    Neck Retraction Limitations  scapular retraction x 10; 4 sets  Other Supine Exercise  cervical retraction in supine 2x10      Manual Therapy   Manual Therapy  Joint mobilization;Soft tissue mobilization;Passive ROM;Manual Traction    Manual therapy comments  all manual interventions performed independently of other interventions    Joint Mobilization  medial glides and PAs to C2-6 bilaterall grade II-III for pain    Soft tissue mobilization  STM to bilateral cervical paraspinals, SCM, suboccipitals    Manual Traction  cerivcal traction - performed by PT - continuous, x5 30-60 seconds holds - felt good             PT Education - 10/05/19 1039    Education Details  Patient educated on HEP, proper frequency and positioning of exercises.    Person(s) Educated  Patient    Methods  Explanation    Comprehension  Verbalized understanding       PT Short Term Goals - 09/21/19 1213      PT SHORT TERM GOAL #1   Title  Patient will be independent with HEP in order to improve functional outcomes.    Time  3    Period  Weeks    Status   On-going    Target Date  10/06/19      PT SHORT TERM GOAL #2   Title  Patient will report 25% improvment in symptoms for improved quality of life.    Time  3    Period  Weeks    Status  On-going    Target Date  10/06/19        PT Long Term Goals - 09/21/19 1213      PT LONG TERM GOAL #1   Title  Patient will report at least 75% improvement in symptoms in order to improve quality of life.    Time  6    Period  Weeks    Status  On-going    Target Date  10/27/19      PT LONG TERM GOAL #2   Title  Patient will improve foto score by at least 7 points in order to demonstrate improved quality of life.    Baseline  52% limited    Time  6    Period  Weeks    Status  On-going    Target Date  10/27/19      PT LONG TERM GOAL #3   Title  Patient will improve cervical ROM by at least 25% in all planes in order to be able to look around while riding in the car.    Time  6    Period  Weeks    Status  On-going    Target Date  10/27/19      PT LONG TERM GOAL #4   Title  Patient will report centralized cervical symptoms no greater than 2/10 for improved ability to complete all ADL.    Time  6    Period  Weeks    Status  On-going    Target Date  10/27/19            Plan - 10/05/19 1040    Clinical Impression Statement  Patient continues to have hypomobile and tender cervical spine with noted improvement in in tissue resistance following mobilizations. He experiences reduction in symptoms following manual therapy intervention.  He requires min verbal and tactile cueing for decreasing intensity of cervical retraction and not moving into cervical flexion while completing. He is unable to perform scapular retraction in supine due to inability to  coordinate the movement and activate appropriate musculature. He is able to perform them in seated with frequent verbal cueing. Patient will continue to benefit from skilled physical therapy in order to reduce impairment and improve function.     Personal Factors and Comorbidities  Age;Behavior Pattern;Comorbidity 3+;Time since onset of injury/illness/exacerbation;Past/Current Experience    Comorbidities  C5-C6 per MRN and patient report (C7-T1 per referral) Fusion in 1995, CHF, DM, lung cancer, COPD    Examination-Activity Limitations  Bed Mobility;Bend;Carry;Lift;Locomotion Level;Sit;Sleep    Examination-Participation Restrictions  Church;Cleaning;Driving;Laundry;Meal Prep;Shop;Volunteer;Yard Work    [employer]  Evolving/Moderate complexity    Rehab Potential  Fair    PT Frequency  2x / week    PT Duration  6 weeks    PT Treatment/Interventions  ADLs/Self Care Home Management;Canalith Repostioning;Electrical Stimulation;Cryotherapy;Moist Heat;Traction;Ultrasound;Functional mobility training;Therapeutic activities;Therapeutic exercise;Neuromuscular re-education;Patient/family education;Manual techniques;Passive range of motion;Dry needling;Spinal Manipulations;Joint Manipulations;Energy conservation;Taping;Vestibular    PT Next Visit Plan  continue with cerivcal mobilization, traction, STM and active cerivcal ROM. Investigate/improve thoracic mobility as able; possibly add SNAGs with rotation, UT stretch, thoracic mobility, postural strengthening as able    PT Home Exercise Plan  cerivcal SNAGs into extension(discontinued for now) 09/22/19: cervical retraction in supine 2x10; cervical excursion exercises. (discontinued for now)    Consulted and Agree with Plan of Care  Patient       Patient will benefit from skilled therapeutic intervention in order to improve the following deficits and impairments:  Decreased activity tolerance, Decreased endurance, Decreased mobility, Decreased range of motion, Hypomobility, Decreased strength, Increased muscle spasms, Impaired sensation, Impaired tone, Impaired UE functional use, Improper body mechanics, Postural dysfunction, Pain  Visit Diagnosis: Cervicalgia  Other symptoms  and signs involving the musculoskeletal system  Abnormal posture  Muscle weakness (generalized)     Problem List Patient Active Problem List   Diagnosis Date Noted  . On continuous oral anticoagulation 05/10/2019  . Mediastinal adenopathy 05/10/2019  . Hilar adenopathy 05/10/2019  . H/O: lung cancer 05/10/2019  . Chronic hypoxemic respiratory failure ([place]) 05/10/2019  . Pleural effusion, bilateral 05/10/2019  . DNR (do not resuscitate) 05/10/2019  . Acute systolic heart failure ([place])   . Community acquired pneumonia of left lower lobe of lung 10/12/2018  . SIRS (systemic inflammatory response syndrome) ([place]) 10/12/2018  . Uncontrolled hypertension 10/12/2018  . Type 2 diabetes mellitus without complication, with long-term current use of insulin ([place]) 10/12/2018  . Chest pain 10/12/2018  . Prolonged QT interval 10/12/2018   11:14 AM, 10/05/19 Mearl Latin PT, DPT Physical Therapist at [place], [place], [address] Phone: [phone]   Fax:  [phone]  Name: Roger Lowe. MRN: [id number] Date of Birth: 1946/07/30

## 2019-10-05 NOTE — Patient Instructions (Addendum)
Thank you for visiting Dr. Valeta Harms at [facility]. Today we recommend the following:  Continue pulmicort and brovanna Continue spiriva respimat  Azithromycin MWF Vitamin D supplement  COVID Vaccine at first availability   Repeat CT chest to be done at [facility]   Return in about 4 months (around 02/02/2020).    Please do your part to reduce the spread of COVID-19.

## 2019-10-05 NOTE — Progress Notes (Signed)
Synopsis: Referred in September 2020 for recurrent pneumonia by Roger Squibb, MD  Subjective:   PATIENT ID: Roger Lowe. GENDER: male DOB: Aug 24, 1946, MRN: [id number]  Chief Complaint  Patient presents with  . Follow-up    f/u pnuemonia of left upper lobe of lung. Breathing is at his baseline.     74 year old gentleman past medical history of congestive heart failure, EF 20 to 25%, diabetes, history of PE, hypertension, lung cancer (? S/p resection on left side, patient unsure if upper or lower). Last PFTS completed 2 years ago in [place]. Saw pulmonologist in [place] regularly. Currently on duonebs, symbiocort 160, Azithro MWF, vest therapy. We currently dont have records from [place] pulmonologist. We will need to request these.  He uses it some vest therapy regularly.  He does state that he had several hospitalizations in [place] with recurrent pneumonias.  After being placed on the vest therapy had significant improvement with his airway clearance.  Currently using duo nebs only 1-2 times per week.  Not on triple therapy at this time.  He did have a recent hospitalization.  Most recent ejection fraction was depressed.  We discussed goals of care today in the office as well.  He does have durable DNR forms completed at home.  We discussed the benefit utility of having a DNR medical bracelet or necklace tag to help ensure that his wishes are met.  OV 06/07/2019: Patient here for follow-up regarding his COPD.  Recent follow-up for CAT scan completed in September.  CT scan was completed which revealed left upper lobe airspace disease concerning for pneumonia.  This was not present on imaging from February 2020.  Recent changes to his medication regimen after last visit.  We stopped his Symbicort went to nebulized therapy.  Currently on Brovana plus Pulmicort, Spiriva Respimat 2 puffs once a day.  He has been doing well with this new regimen.  He does feel like his breathing is better from the comparison  before.  He is also followed up closely with the cardiology and heart failure clinic.  Otherwise he is very happy with his new team/health care team after moving from [place].  Patient denies fevers chills night sweats weight loss sputum production above his baseline.  OV 10/05/2019: doing ok from a breathing standpoint. He has been trying to sustain his exercise level. He went out walking yesterday. Follows regularly with cardiology. He has been seen by orthopedics and cervical spine disease. At this point surgery was concerned about his chance at recovery.  Patient denies fevers chills night sweats weight loss.  He does have sputum production but this is at his baseline.  He is maintained well on Brovana plus Pulmicort plus Spiriva Respimat   Past Medical History:  Diagnosis Date  . Asthma   . Cancer ([place])   . CHF (congestive heart failure) ([place])    a. EF 45-50% by echo in 07/2017 b. EF reduced to 20-25% by repeat echo in 10/2018  . Coronary artery disease    a. cath in 2016 showing mild nonobstructive disease b. cath in 10/2018 showing nonobstructive CAD with 10% LM stenosis, 25% Proximal-LAD, 25% LCx, and mild pulmonary HTN  . Diabetes mellitus without complication (Roger Lowe)   . Gout   . History of pulmonary embolus (PE) 2016  . Hypertension   . Lung cancer (Roger Lowe)   . Pneumonia      Family History  Problem Relation Age of Onset  . CAD Brother  Past Surgical History:  Procedure Laterality Date  . CERVICAL SPINE SURGERY    . RIGHT/LEFT HEART CATH AND CORONARY ANGIOGRAPHY N/A 11/08/2018   Procedure: RIGHT/LEFT HEART CATH AND CORONARY ANGIOGRAPHY;  Surgeon: [name], MD;  Location: [facility];  Service: Cardiovascular;  Laterality: N/A;    Social History   Socioeconomic History  . Marital status: Single    Spouse name: Not on file  . Number of children: Not on file  . Years of education: Not on file  . Highest education level: Not on file  Occupational History   . Not on file  Tobacco Use  . Smoking status: Former Smoker    Quit date: 09/05/2003    Years since quitting: 16.0  . Smokeless tobacco: Never Used  Substance and Sexual Activity  . Alcohol use: Not Currently  . Drug use: Not Currently  . Sexual activity: Not on file  Other Topics Concern  . Not on file  Social History Narrative  . Not on file   Social Determinants of Health   Financial Resource Strain:   . Difficulty of Paying Living Expenses: Not on file  Food Insecurity:   . Worried About Charity fundraiser in the Last Year: Not on file  . Ran Out of Food in the Last Year: Not on file  Transportation Needs:   . Lack of Transportation (Medical): Not on file  . Lack of Transportation (Non-Medical): Not on file  Physical Activity:   . Days of Exercise per Week: Not on file  . Minutes of Exercise per Session: Not on file  Stress:   . Feeling of Stress : Not on file  Social Connections:   . Frequency of Communication with Friends and Family: Not on file  . Frequency of Social Gatherings with Friends and Family: Not on file  . Attends Religious Services: Not on file  . Active Member of Clubs or Organizations: Not on file  . Attends Archivist Meetings: Not on file  . Marital Status: Not on file  Intimate Partner Violence:   . Fear of Current or Ex-Partner: Not on file  . Emotionally Abused: Not on file  . Physically Abused: Not on file  . Sexually Abused: Not on file     No Known Allergies   Outpatient Medications Prior to Visit  Medication Sig Dispense Refill  . albuterol (PROVENTIL HFA;VENTOLIN HFA) 108 (90 Base) MCG/ACT inhaler Inhale 2 puffs into the lungs 2 (two) times a day.     . albuterol (PROVENTIL) (2.5 MG/3ML) 0.083% nebulizer solution Take 3 mLs (2.5 mg total) by nebulization every 4 (four) hours. And PRN. 540 mL 1  . allopurinol (ZYLOPRIM) 300 MG tablet Take 300 mg by mouth daily.    [name] amitriptyline (ELAVIL) 50 MG tablet Take 50 mg by mouth 2  (two) times daily.    [name] arformoterol (BROVANA) 15 MCG/2ML NEBU Take 2 mLs (15 mcg total) by nebulization 2 (two) times daily. 120 mL 6  . atorvastatin (LIPITOR) 20 MG tablet Take 1 tablet (20 mg total) by mouth at bedtime. 90 tablet 3  . budesonide (PULMICORT) 0.5 MG/2ML nebulizer solution Take 2 mLs (0.5 mg total) by nebulization 2 (two) times daily. 120 mL 12  . budesonide-formoterol (SYMBICORT) 160-4.5 MCG/ACT inhaler Inhale 2 puffs into the lungs 2 (two) times daily.    . carvedilol (COREG) 25 MG tablet Take 1 tablet (25 mg total) by mouth 2 (two) times daily. 180 tablet 3  .  famotidine (PEPCID) 40 MG tablet Take 40 mg by mouth 2 (two) times daily.     . furosemide (LASIX) 20 MG tablet Take 1 tablet (20 mg total) by mouth daily as needed for fluid. 90 tablet 3  . hydrALAZINE (APRESOLINE) 50 MG tablet Take 1 tablet (50 mg total) by mouth 2 (two) times daily. 180 tablet 3  . HYDROcodone-acetaminophen (NORCO) 10-325 MG tablet Take 1 tablet by mouth every 6 (six) hours as needed for moderate pain. 12 tablet 0  . insulin aspart (NOVOLOG FLEXPEN) 100 UNIT/ML FlexPen Inject 25 Units into the skin 3 (three) times daily with meals. Plus personal sliding scale    . Insulin Detemir (LEVEMIR FLEXTOUCH) 100 UNIT/ML Pen Inject 45 Units into the skin at bedtime. 15 mL 0  . ipratropium-albuterol (DUONEB) 0.5-2.5 (3) MG/3ML SOLN Take 3 mLs by nebulization every 4 (four) hours as needed. (Patient taking differently: Take 3 mLs by nebulization every 4 (four) hours as needed (shortness of breath). ) 180 mL 0  . neomycin-polymyxin-hydrocortisone (CORTISPORIN) 3.5-10000-1 OTIC suspension Place 4 drops into the left ear 4 (four) times daily. X 7 days 10 mL 0  . nitroGLYCERIN (NITROSTAT) 0.4 MG SL tablet Place 1 tablet (0.4 mg total) under the tongue every 5 (five) minutes as needed for chest pain. 25 tablet 2  . rivaroxaban (XARELTO) 20 MG TABS tablet Take 1 tablet (20 mg total) by mouth every morning. 90 tablet 3  .  sacubitril-valsartan (ENTRESTO) 97-103 MG Take 1 tablet by mouth 2 (two) times daily. 60 tablet 9  . spironolactone (ALDACTONE) 25 MG tablet Take 1 tablet (25 mg total) by mouth daily. 90 tablet 3  . Tiotropium Bromide Monohydrate (SPIRIVA RESPIMAT) 2.5 MCG/ACT AERS Inhale 2 puffs into the lungs daily. 4 g 3   No facility-administered medications prior to visit.    Review of Systems  Constitutional: Negative for chills, fever, malaise/fatigue and weight loss.  HENT: Negative for hearing loss, sore throat and tinnitus.   Eyes: Negative for blurred vision and double vision.  Respiratory: Positive for sputum production and shortness of breath. Negative for cough, hemoptysis, wheezing and stridor.   Cardiovascular: Negative for chest pain, palpitations, orthopnea, leg swelling and PND.  Gastrointestinal: Negative for abdominal pain, constipation, diarrhea, heartburn, nausea and vomiting.  Genitourinary: Negative for dysuria, hematuria and urgency.  Musculoskeletal: Negative for joint pain and myalgias.  Skin: Negative for itching and rash.  Neurological: Negative for dizziness, tingling, weakness and headaches.  Endo/Heme/Allergies: Negative for environmental allergies. Does not bruise/bleed easily.  Psychiatric/Behavioral: Negative for depression. The patient is not nervous/anxious and does not have insomnia.   All other systems reviewed and are negative.    Objective:  Physical Exam Vitals reviewed.  Constitutional:      General: He is not in acute distress.    Appearance: He is well-developed.  HENT:     Head: Normocephalic and atraumatic.     Mouth/Throat:     Pharynx: No oropharyngeal exudate.  Eyes:     Conjunctiva/sclera: Conjunctivae normal.     Pupils: Pupils are equal, round, and reactive to light.  Neck:     Vascular: No JVD.     Trachea: No tracheal deviation.     Comments: Loss of supraclavicular fat Cardiovascular:     Rate and Rhythm: Normal rate and regular  rhythm.     Heart sounds: S1 normal and S2 normal.     Comments: Distant heart tones Pulmonary:     Effort: No tachypnea or  accessory muscle usage.     Breath sounds: No stridor. Decreased breath sounds (throughout all lung fields) present. No wheezing, rhonchi or rales.  Abdominal:     General: Bowel sounds are normal. There is no distension.     Palpations: Abdomen is soft.     Tenderness: There is no abdominal tenderness.  Musculoskeletal:        General: Deformity (muscle wasting ) present.     Right lower leg: No edema.     Left lower leg: No edema.  Skin:    General: Skin is warm and dry.     Capillary Refill: Capillary refill takes less than 2 seconds.     Findings: No rash.  Neurological:     Mental Status: He is alert and oriented to person, place, and time.  Psychiatric:        Behavior: Behavior normal.      Vitals:   10/05/19 1421  BP: 122/76  Pulse: 100  Temp: 98.1 F (36.7 C)  TempSrc: Temporal  SpO2: 99%  Weight: 225 lb (102.1 kg)  Height: 6' (1.829 m)   99% on RA BMI Readings from Last 3 Encounters:  10/05/19 30.52 kg/m  07/15/19 29.70 kg/m  06/03/19 30.79 kg/m   Wt Readings from Last 3 Encounters:  10/05/19 225 lb (102.1 kg)  07/15/19 219 lb (99.3 kg)  06/03/19 227 lb (103 kg)     CBC    Component Value Date/Time   WBC 8.8 02/01/2019 1117   RBC 5.59 02/01/2019 1117   HGB 16.6 02/01/2019 1117   HCT 51.2 02/01/2019 1117   PLT 209 02/01/2019 1117   MCV 91.6 02/01/2019 1117   MCH 29.7 02/01/2019 1117   MCHC 32.4 02/01/2019 1117   RDW 14.0 02/01/2019 1117   LYMPHSABS 2.3 02/01/2019 1117   MONOABS 0.8 02/01/2019 1117   EOSABS 0.2 02/01/2019 1117   BASOSABS 0.0 02/01/2019 1117    Chest Imaging:  10/11/2018 CT chest: Left basilar airspace disease, small pleural effusion bilateral. Mediastinal adenopathy, hilar adenopathy  September 2020 CT chest: Left upper lobe infiltrate, no effusion  Pulmonary Functions Testing Results: No  flowsheet data found.  FeNO: None   Pathology:  ?locate previous lung cancer diagnosis   Echocardiogram: None   Heart Catheterization: None     Assessment & Plan:     ICD-10-CM   1. Chronic hypoxemic respiratory failure (HCC)  J96.11 CT CHEST WO CONTRAST  2. Stage 3 severe COPD by GOLD classification (HCC)  J44.9 CT CHEST WO CONTRAST  3. DNR (do not resuscitate)  Z66   4. History of community acquired pneumonia  Z87.01 CT CHEST WO CONTRAST    Discussion:  This is a 74 year old gentleman history of congestive heart failure, chronic systolic heart failure, history of PE, history of lung cancer, history of chronic hypoxemic respiratory failure secondary to severe COPD.  Was previously followed by pulmonologist in [place].  Documentation and prior office notes reviewed from [place] in care everywhere.  He was seen at [place].  From a COPD management he is doing well with his current triple therapy and regimen to include Pulmicort plus Brovana plus Spiriva Respimat he is also on azithromycin Monday Wednesday and Friday.  Also continuing vitamin D supplementation over-the-counter.  Plan Following Extensive Data Review & Interpretation:  . I reviewed prior external note(s) from 07/15/2019 cardiology, Ahmed Prima, and PA.  No change in current regimen . I reviewed the result(s) of previous CT scan September 2020 with persistent infiltrate concerning  for pneumonia . I have ordered repeat CT scan for further evaluation and ensure resolution of this abnormality seen on prior imaging.  Independent interpretation of tests . Review of patient's 05/23/2019 CT chest images revealed airspace disease and infiltrate within the left upper lobe concerning for pneumonia.  Need ensure resolution of this this was discussed with patient today.. The patient's images have been independently reviewed by me.     Patient return to clinic in 6 months or as needed.  He will contact us if we need additional refills  at this time he does not.  Patient was counseled on Covid vaccine.  Patient should obtain this as soon as he is able.  He states he will reach out to the location that is designated in [place] trying to sign up for this week.  50% of this patient's 32 minutes visit was spent face-to-face discussing above recommendations and treatment plan and review of data as described above.  [facility] 10/05/2019 2:38 PM

## 2019-10-06 ENCOUNTER — Ambulatory Visit (HOSPITAL_COMMUNITY): Payer: Medicare HMO | Admitting: Physical Therapy

## 2019-10-07 ENCOUNTER — Telehealth: Payer: Self-pay | Admitting: *Deleted

## 2019-10-07 NOTE — Telephone Encounter (Signed)
-----   Message from Erma Heritage, [place] sent at 10/04/2019  1:14 PM EST ----- Please let the patient know the pumping function of his heart has continued to improve but remains reduced. His EF was previously 20 to 25% in 10/2018, 25-30% by echo in 04/2019, and now at 30 to 35% by imaging today. Normal is 55-65%. He was supposed to have a follow-up visit with myself or Dr. Harl Bowie following his echocardiogram but it does not look like this was scheduled.  Please arrange for a follow-up visit within the next several weeks for further medication titration and in follow-up to his MyChart message.

## 2019-10-07 NOTE — Telephone Encounter (Signed)
Called patient with test results. No answer. Unable to leave a msg.

## 2019-10-10 ENCOUNTER — Encounter (HOSPITAL_COMMUNITY): Payer: Self-pay | Admitting: Physical Therapy

## 2019-10-10 ENCOUNTER — Other Ambulatory Visit: Payer: Self-pay

## 2019-10-10 ENCOUNTER — Ambulatory Visit (HOSPITAL_COMMUNITY): Payer: Medicare HMO | Admitting: Physical Therapy

## 2019-10-10 DIAGNOSIS — M542 Cervicalgia: Secondary | ICD-10-CM

## 2019-10-10 DIAGNOSIS — R29898 Other symptoms and signs involving the musculoskeletal system: Secondary | ICD-10-CM | POA: Diagnosis not present

## 2019-10-10 DIAGNOSIS — M6281 Muscle weakness (generalized): Secondary | ICD-10-CM

## 2019-10-10 DIAGNOSIS — R293 Abnormal posture: Secondary | ICD-10-CM

## 2019-10-10 NOTE — Therapy (Signed)
[place], [place], [address] Phone: [phone]   Fax:  [phone]  Physical Therapy Treatment  Patient Details  Name: Roger Lowe. MRN: [id number] Date of Birth: 1946-03-25 Referring Provider (PT): Melina Schools MD   Encounter Date: 10/10/2019  PT End of Session - 10/10/19 1529    Visit Number  7    Number of Visits  12    Date for PT Re-Evaluation  10/27/19    Authorization Type  Primary: [employer] HMO Secondary: Medicaid [place] (yes auth)    Authorization Time Period  09/15/19-10/27/19    Authorization - Visit Number  7    Authorization - Number of Visits  10    PT Start Time  6962    PT Stop Time  1600    PT Time Calculation (min)  42 min    Equipment Utilized During Treatment  Oxygen    Activity Tolerance  Patient tolerated treatment well    Behavior During Therapy  [facility] for tasks assessed/performed       Past Medical History:  Diagnosis Date  . Asthma   . Cancer ([place])   . CHF (congestive heart failure) ([place])    a. EF 45-50% by echo in 07/2017 b. EF reduced to 20-25% by repeat echo in 10/2018  . Coronary artery disease    a. cath in 2016 showing mild nonobstructive disease b. cath in 10/2018 showing nonobstructive CAD with 10% LM stenosis, 25% Proximal-LAD, 25% LCx, and mild pulmonary HTN  . Diabetes mellitus without complication ([place])   . Gout   . History of pulmonary embolus (PE) 2016  . Hypertension   . Lung cancer ([place])   . Pneumonia     Past Surgical History:  Procedure Laterality Date  . CERVICAL SPINE SURGERY    . RIGHT/LEFT HEART CATH AND CORONARY ANGIOGRAPHY N/A 11/08/2018   Procedure: RIGHT/LEFT HEART CATH AND CORONARY ANGIOGRAPHY;  Surgeon: [name], MD;  Location: [place] [facility];  Service: Cardiovascular;  Laterality: N/A;    There were no vitals filed for this visit.  Subjective Assessment - 10/10/19 1517    Subjective  Patient reports his neck was doing good  until this morning. He felt pretty good Saturday and then Sunday it was bothering him. He did the prior towel exercise that was discontinued. He also did his chin tucks and distraction with a device [place] He felt good on Saturday but not on Sunday. He took pain medication this morning because his neck was bothering him.    Pertinent History  C7-T1 Fusion in 1995, CHF, DM, lung cancer, COPD    Limitations  Other (comment)   lying down   How long can you stand comfortably?  unrestricted    How long can you walk comfortably?  3-4 blocks slowly    Patient Stated Goals  to decrease symptoms    Currently in Pain?  Yes    Pain Score  4     Pain Location  Neck    Pain Onset  More than a month ago                       [facility] Adult PT Treatment/Exercise - 10/10/19 0001      Neck Exercises: Seated   Neck Retraction  10 reps;5 secs    Neck Retraction Limitations  scapular retractions      Neck Exercises: Supine   Other  Supine Exercise  cervical retraction in supine 2x10      Manual Therapy   Manual Therapy  Joint mobilization;Soft tissue mobilization;Passive ROM;Manual Traction    Manual therapy comments  all manual interventions performed independently of other interventions    Joint Mobilization  medial glides and PAs to C2-6 bilaterall grade II-III for pain    Soft tissue mobilization  STM to bilateral cervical paraspinals, SCM, suboccipitals    Passive ROM  manual cervical retraction in supine 2x10 with OP    Manual Traction  cerivcal traction - performed by PT - continuous, x5 30-60 seconds holds - felt good             PT Education - 10/10/19 1527    Education Details  Patient educated on HEP, proper frequency and positioning of exercises.    Person(s) Educated  Patient    Methods  Explanation    Comprehension  Verbalized understanding       PT Short Term Goals - 09/21/19 1213      PT SHORT TERM GOAL #1   Title  Patient will be independent with  HEP in order to improve functional outcomes.    Time  3    Period  Weeks    Status  On-going    Target Date  10/06/19      PT SHORT TERM GOAL #2   Title  Patient will report 25% improvment in symptoms for improved quality of life.    Time  3    Period  Weeks    Status  On-going    Target Date  10/06/19        PT Long Term Goals - 09/21/19 1213      PT LONG TERM GOAL #1   Title  Patient will report at least 75% improvement in symptoms in order to improve quality of life.    Time  6    Period  Weeks    Status  On-going    Target Date  10/27/19      PT LONG TERM GOAL #2   Title  Patient will improve foto score by at least 7 points in order to demonstrate improved quality of life.    Baseline  52% limited    Time  6    Period  Weeks    Status  On-going    Target Date  10/27/19      PT LONG TERM GOAL #3   Title  Patient will improve cervical ROM by at least 25% in all planes in order to be able to look around while riding in the car.    Time  6    Period  Weeks    Status  On-going    Target Date  10/27/19      PT LONG TERM GOAL #4   Title  Patient will report centralized cervical symptoms no greater than 2/10 for improved ability to complete all ADL.    Time  6    Period  Weeks    Status  On-going    Target Date  10/27/19            Plan - 10/10/19 1605    Clinical Impression Statement  Patient likely aggravated symptoms over the weekend performing previously discontinued snag exercise. He continues to have tender and hypomobile cervical spine which improves following manual therapy. He tolerates manual therapy well for improving cervical mobility and has no c/o pain during. He guards against manual retractions and intermittently relaxes  enough to complete. He is able to perform supine cervical retractions which he states feels "like his neck needs to move that way". He has difficulty performing scapular retractions due to difficulty isolating appropriate  musculature. Patient will continue to benefit from skilled physical therapy in order to improve function and reduce impairment.    Personal Factors and Comorbidities  Age;Behavior Pattern;Comorbidity 3+;Time since onset of injury/illness/exacerbation;Past/Current Experience    Comorbidities  C5-C6 per MRN and patient report (C7-T1 per referral) Fusion in 1995, CHF, DM, lung cancer, COPD    Examination-Activity Limitations  Bed Mobility;Bend;Carry;Lift;Locomotion Level;Sit;Sleep    Examination-Participation Restrictions  Church;Cleaning;Driving;Laundry;Meal Prep;Shop;Volunteer;Yard Work    [employer]  Evolving/Moderate complexity    Rehab Potential  Fair    PT Frequency  2x / week    PT Duration  6 weeks    PT Treatment/Interventions  ADLs/Self Care Home Management;Canalith Repostioning;Electrical Stimulation;Cryotherapy;Moist Heat;Traction;Ultrasound;Functional mobility training;Therapeutic activities;Therapeutic exercise;Neuromuscular re-education;Patient/family education;Manual techniques;Passive range of motion;Dry needling;Spinal Manipulations;Joint Manipulations;Energy conservation;Taping;Vestibular    PT Next Visit Plan  continue with cerivcal mobilization, traction, STM and active cerivcal ROM. Investigate/improve thoracic mobility as able; possibly add SNAGs with rotation, UT stretch, thoracic mobility, postural strengthening as able    PT Home Exercise Plan  cerivcal SNAGs into extension(discontinued for now) 09/22/19: cervical retraction in supine 2x10; cervical excursion exercises. (discontinued for now)    Consulted and Agree with Plan of Care  Patient       Patient will benefit from skilled therapeutic intervention in order to improve the following deficits and impairments:  Decreased activity tolerance, Decreased endurance, Decreased mobility, Decreased range of motion, Hypomobility, Decreased strength, Increased muscle spasms, Impaired sensation, Impaired tone,  Impaired UE functional use, Improper body mechanics, Postural dysfunction, Pain  Visit Diagnosis: Cervicalgia  Other symptoms and signs involving the musculoskeletal system  Abnormal posture  Muscle weakness (generalized)     Problem List Patient Active Problem List   Diagnosis Date Noted  . On continuous oral anticoagulation 05/10/2019  . Mediastinal adenopathy 05/10/2019  . Hilar adenopathy 05/10/2019  . H/O: lung cancer 05/10/2019  . Chronic hypoxemic respiratory failure ([place]) 05/10/2019  . Pleural effusion, bilateral 05/10/2019  . DNR (do not resuscitate) 05/10/2019  . Acute systolic heart failure ([place])   . Community acquired pneumonia of left lower lobe of lung 10/12/2018  . SIRS (systemic inflammatory response syndrome) ([place]) 10/12/2018  . Uncontrolled hypertension 10/12/2018  . Type 2 diabetes mellitus without complication, with long-term current use of insulin ([place]) 10/12/2018  . Chest pain 10/12/2018  . Prolonged QT interval 10/12/2018   4:12 PM, 10/10/19 Mearl Latin PT, DPT Physical Therapist at [place], [place], [address] Phone: [phone]   Fax:  [phone]  Name: Roger Lowe. MRN: [id number] Date of Birth: 1945/12/31

## 2019-10-11 ENCOUNTER — Ambulatory Visit (HOSPITAL_COMMUNITY)
Admission: RE | Admit: 2019-10-11 | Discharge: 2019-10-11 | Disposition: A | Discharge status: Home / Self Care | Payer: Medicare HMO | Source: Ambulatory Visit | Attending: Pulmonary Disease | Admitting: Pulmonary Disease

## 2019-10-11 ENCOUNTER — Encounter (HOSPITAL_COMMUNITY): Payer: Medicare HMO | Admitting: Physical Therapy

## 2019-10-11 DIAGNOSIS — Z8701 Personal history of pneumonia (recurrent): Secondary | ICD-10-CM | POA: Insufficient documentation

## 2019-10-11 DIAGNOSIS — J449 Chronic obstructive pulmonary disease, unspecified: Secondary | ICD-10-CM | POA: Insufficient documentation

## 2019-10-11 DIAGNOSIS — J9611 Chronic respiratory failure with hypoxia: Secondary | ICD-10-CM | POA: Insufficient documentation

## 2019-10-11 DIAGNOSIS — J189 Pneumonia, unspecified organism: Secondary | ICD-10-CM | POA: Diagnosis not present

## 2019-10-11 IMAGING — CT CT CHEST W/O CM
2 of 4 series · 15 of 36 positions shown, 18 images · non-contrast
Comparison: [DATE] and [DATE].

CLINICAL DATA: Pneumonia.  Severe COPD.

EXAM:
CT CHEST WITHOUT CONTRAST
TECHNIQUE: Multidetector CT imaging of the chest was performed following the
standard protocol without IV contrast.

[Series 2: routine chest without · axial · non-contrast · 0.66mm/px · z∈[+1295,+1567]mm · 12 of 160 slices shown, 15 images]
[im 12/160  mediastinal]
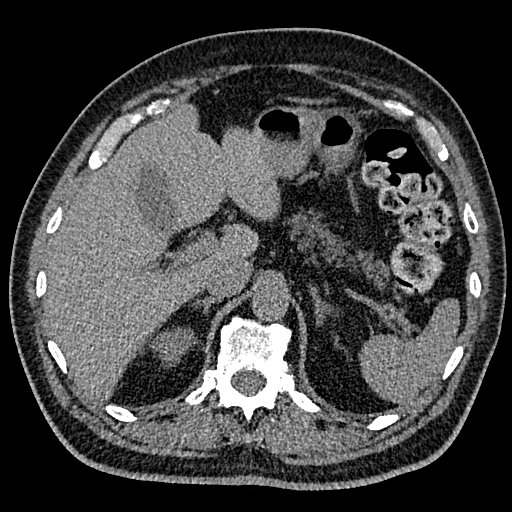
[im 12/160  lung]
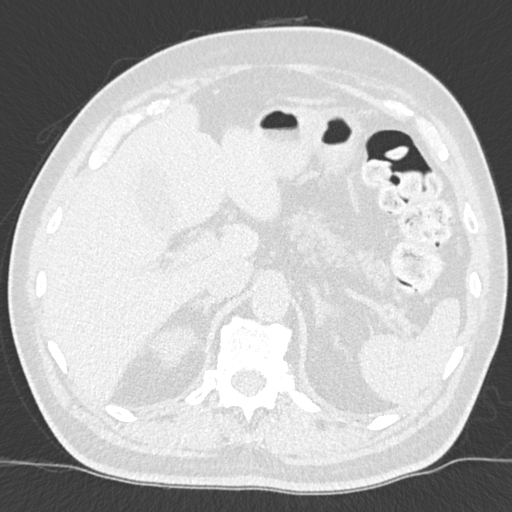
[im 23/160  lung]
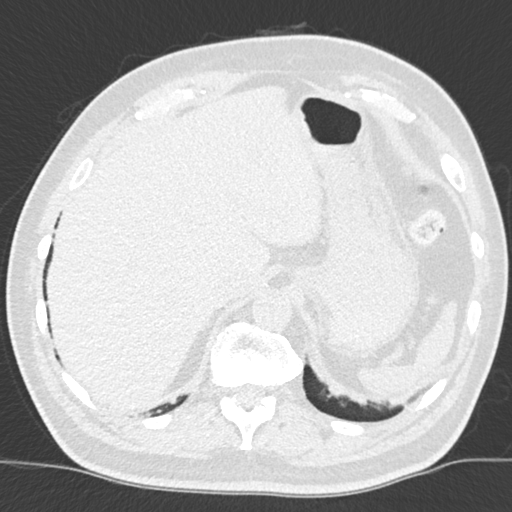
[im 35/160  lung]
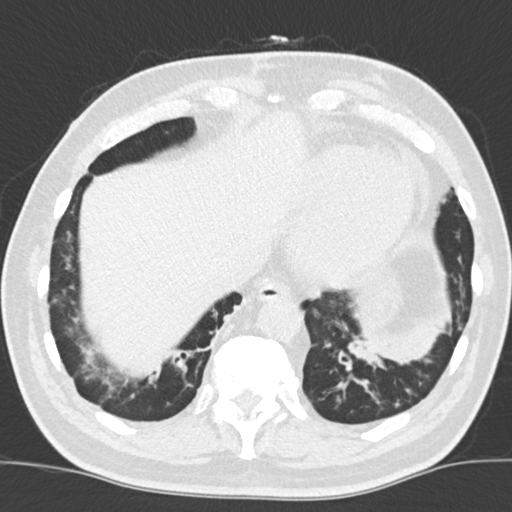
[im 46/160  lung]
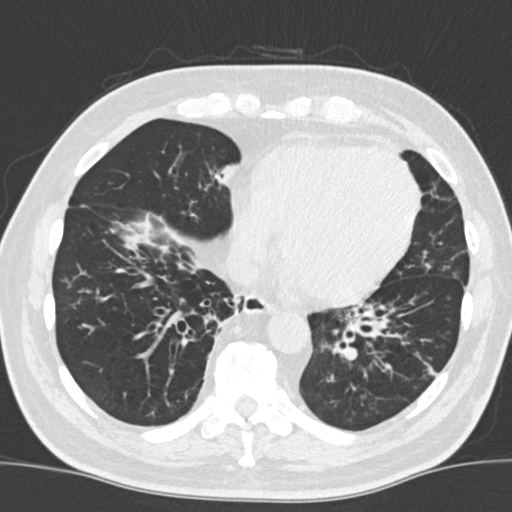
[im 57/160  mediastinal]
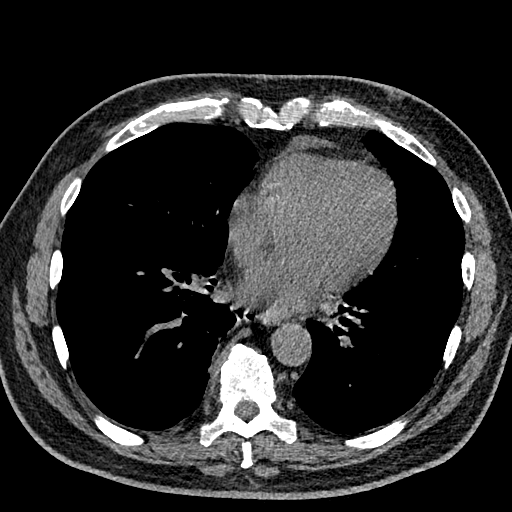
[im 57/160  lung]
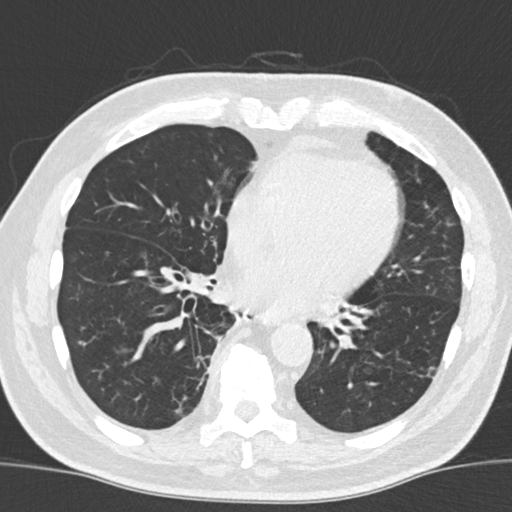
[im 69/160  lung]
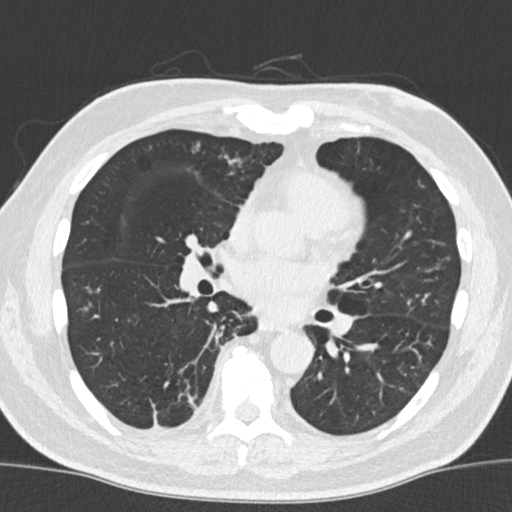
[im 91/160  lung]
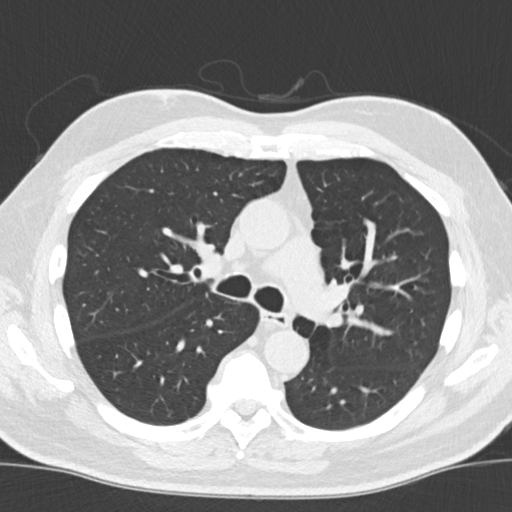
[im 103/160  lung]
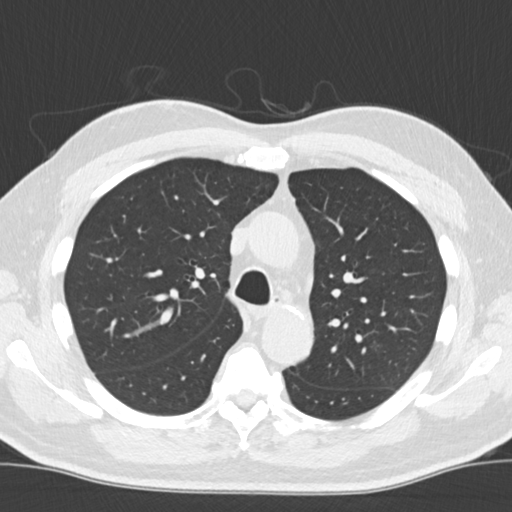
[im 114/160  mediastinal]
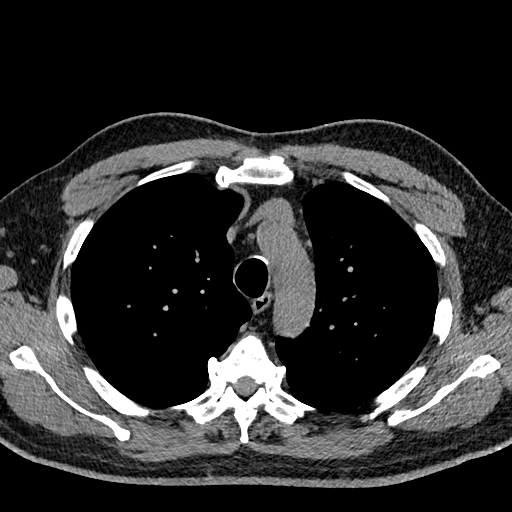
[im 114/160  lung]
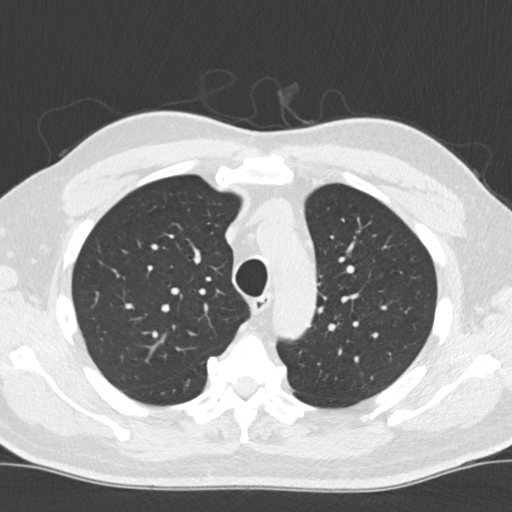
[im 125/160  lung]
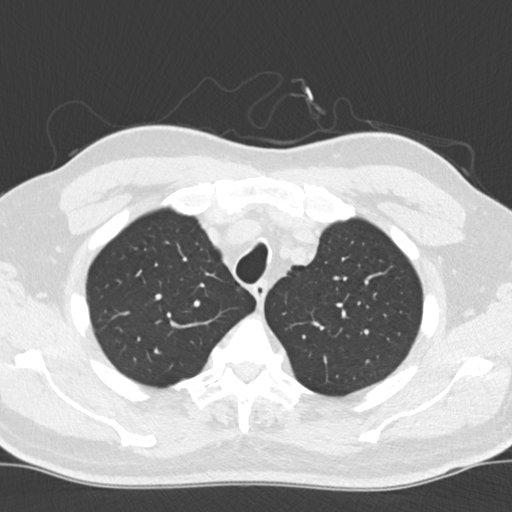
[im 137/160  lung]
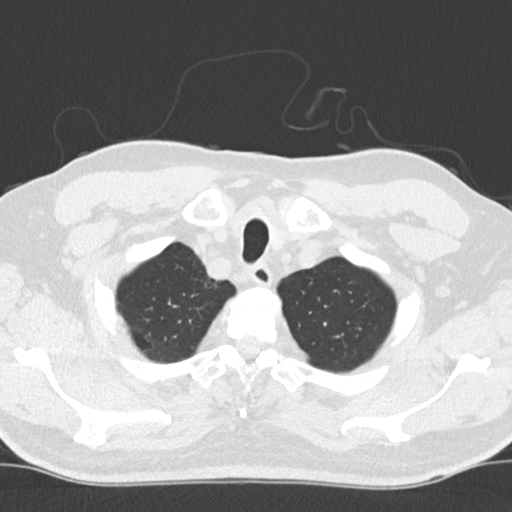
[im 148/160  lung]
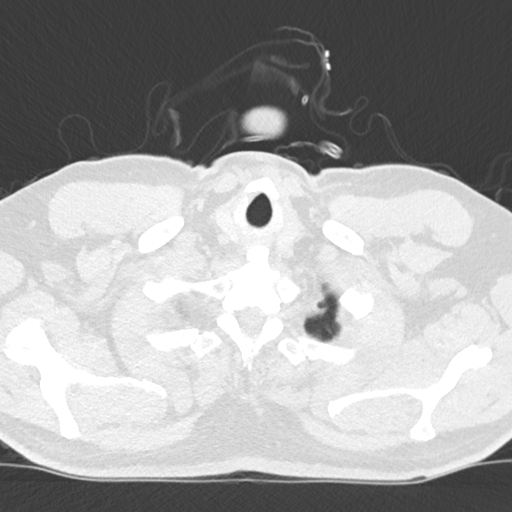

[Series 6: coronal · coronal · 0.64mm/px · 3 of 131 slices shown]
[im 27/131  lung]
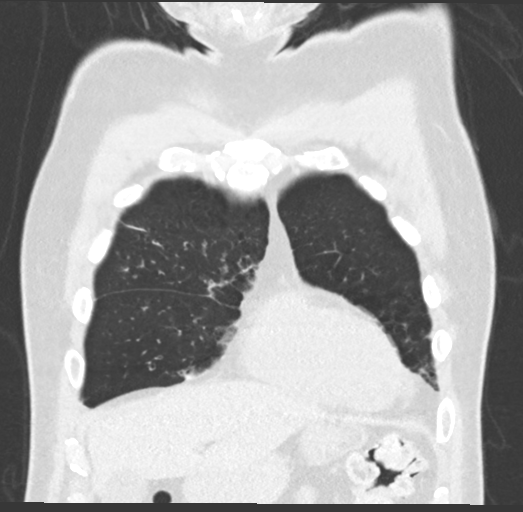
[im 53/131  lung]
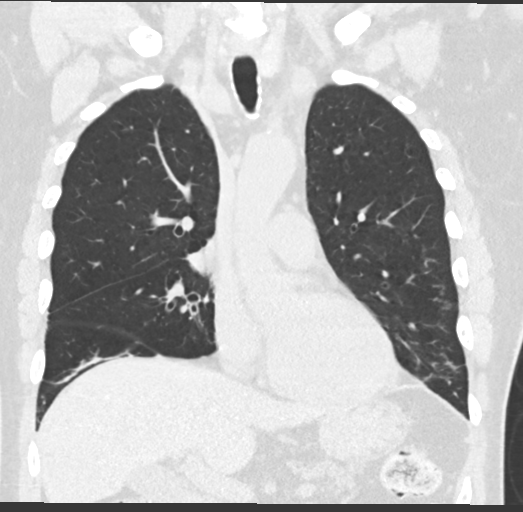
[im 79/131  lung]
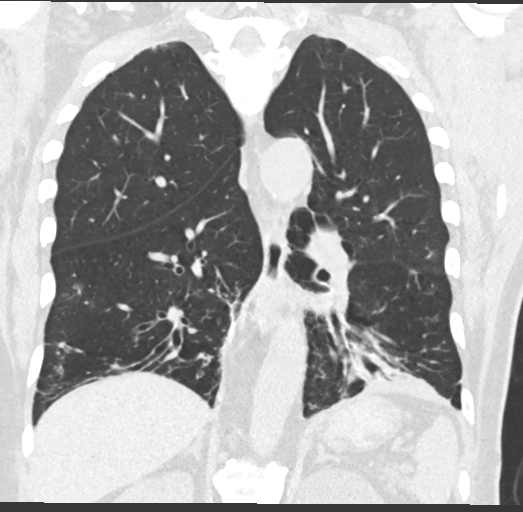

[15 of 36 positions shown; findings below may reference images not displayed]

FINDINGS: Cardiovascular: Atherosclerotic calcification of the aorta. Heart is
at the upper limits of normal in size. No pericardial effusion.

Mediastinum/Nodes: Mediastinal lymph nodes are not enlarged by CT
size criteria, measuring up to 9 mm in the low left paratracheal
station, as before. Hilar regions are difficult to definitively
evaluate but appear grossly unremarkable. Axillary adenopathy
esophagus is grossly unremarkable.

Lungs/Pleura: Centrilobular and paraseptal emphysema. Residual but
overall improved peribronchovascular nodularity and consolidation,
mid and lower lung zone predominant. Associated varicose
bronchiectasis and mucoid impaction. Findings are new from
[DATE]. No pleural fluid. Airway is unremarkable.

Upper Abdomen: Visualized portions the liver, gallbladder, adrenal
glands and right kidney are unremarkable. Low-attenuation lesion off
the upper pole left kidney measures 5.4 cm, incompletely imaged.
Visualized portions of the spleen, pancreas, stomach and bowel are
grossly unremarkable. No upper abdominal adenopathy.

Musculoskeletal: Degenerative changes in the spine.
IMPRESSION: 1. Residual but overall improved peribronchovascular nodularity and
consolidation, mid and lower lung zone predominant, and likely due
to postinfectious scarring, especially given that the findings are
new from 1 year prior.
2.  Aortic atherosclerosis ([B0]-[B0]).

## 2019-10-12 ENCOUNTER — Telehealth (INDEPENDENT_AMBULATORY_CARE_PROVIDER_SITE_OTHER): Payer: Medicare HMO | Admitting: Student

## 2019-10-12 ENCOUNTER — Encounter: Payer: Self-pay | Admitting: Student

## 2019-10-12 VITALS — BP 128/74 | HR 91 | Ht 72.0 in | Wt 216.8 lb

## 2019-10-12 DIAGNOSIS — I10 Essential (primary) hypertension: Secondary | ICD-10-CM

## 2019-10-12 DIAGNOSIS — I340 Nonrheumatic mitral (valve) insufficiency: Secondary | ICD-10-CM

## 2019-10-12 DIAGNOSIS — E785 Hyperlipidemia, unspecified: Secondary | ICD-10-CM

## 2019-10-12 DIAGNOSIS — I5042 Chronic combined systolic (congestive) and diastolic (congestive) heart failure: Secondary | ICD-10-CM

## 2019-10-12 DIAGNOSIS — I2510 Atherosclerotic heart disease of native coronary artery without angina pectoris: Secondary | ICD-10-CM

## 2019-10-12 NOTE — Progress Notes (Signed)
Virtual Visit via Telephone Note   This visit type was conducted due to national recommendations for restrictions regarding the COVID-19 Pandemic (e.g. social distancing) in an effort to limit this patient's exposure and mitigate transmission in our community.  Due to his co-morbid illnesses, this patient is at least at moderate risk for complications without adequate follow up.  This format is felt to be most appropriate for this patient at this time.  The patient did not have access to video technology/had technical difficulties with video requiring transitioning to audio format only (telephone).  All issues noted in this document were discussed and addressed.  No physical exam could be performed with this format.  Please refer to the patient's chart for his  consent to telehealth for [name].   Date:  10/12/2019   ID:  Roger China., DOB September 09, 1945, MRN [id number]  Patient Location: Home Provider Location: Office  PCP:  [name], MD  Cardiologist:  Carlyle Dolly, MD  Electrophysiologist:  None   Evaluation Performed:  Follow-Up Visit  Chief Complaint: 81-month visit  History of Present Illness:    Roger Martis. is a 74 y.o. male with past medical history of CAD (cath in 2016 showing mild nonobstructive disease, nonobstructive CAD by repeat cath in 10/2018), chronic combined systolic and diastolic CHF(EF 62-94% by echo in 07/2017, at 20-25% by repeat echo in 10/2018 and 25-30% by echo in 04/2019),LBBB, mild to moderate MR, COPD,HTN, HLD, Type 2 DM, and history of PE (on Xarelto) who presents for a 26-month follow-up telehealth visit.   He most recently had a phone visit with myself in 07/2019 and was overall doing well, denying any recent chest pain and breathing had been at baseline. He had lost over 8 lbs with dietary chnages. He was on Coreg 25mg  BID, Entresto 97/103mg  BID, Hydralazine 50mg  BID and Spironolactone 12.5mg  daily. Spironolactone was titrated to 25mg  daily and  it was recommended he have a repeat echocardiogram in 3 months for further evaluation. This was performed in 10/2019 and showed his EF had slightly improved to 30-35% with mild LVH and global HK.   In talking with the patient today, he reports overall doing well from a cardiac perspective since his last visit. His child and grandchildren who are 66 and 69 years old are now staying at his home and he reports this keeps him active. He did have an episode of chest pain last month which resolved with the use of SL NTG. Denies any repeat episodes since. He does wear 2 L nasal cannula due to his COPD when out and about but does not have to use this routinely at home. Reports oxygen saturations have overall remained stable. He denies any recent orthopnea, PND or lower extremity edema. He utilizes Lasix approximately once every 2 to 3 weeks. Reports good compliance with his medication regimen.  He was evaluated by Orthopedics for cervical back pain and reports they are pursuing a more conservative approach at this time with PT and steroid injections.  The patient does not have symptoms concerning for COVID-19 infection (fever, chills, cough, or new shortness of breath).    Past Medical History:  Diagnosis Date  . Asthma   . Cancer ([place])   . CHF (congestive heart failure) ([place])    a. EF 45-50% by echo in 07/2017 b. EF reduced to 20-25% by repeat echo in 10/2018  . Coronary artery disease    a. cath in 2016 showing mild nonobstructive disease b. cath  in 10/2018 showing nonobstructive CAD with 10% LM stenosis, 25% Proximal-LAD, 25% LCx, and mild pulmonary HTN  . Diabetes mellitus without complication ([place])   . Gout   . History of pulmonary embolus (PE) 2016  . Hypertension   . Lung cancer ([place])   . Pneumonia    Past Surgical History:  Procedure Laterality Date  . CERVICAL SPINE SURGERY    . RIGHT/LEFT HEART CATH AND CORONARY ANGIOGRAPHY N/A 11/08/2018   Procedure: RIGHT/LEFT HEART CATH AND CORONARY  ANGIOGRAPHY;  Surgeon: [name], MD;  Location: [place] [facility];  Service: Cardiovascular;  Laterality: N/A;     No outpatient medications have been marked as taking for the 10/12/19 encounter (Appointment) with Erma Heritage, PA-C.     Allergies:   Patient has no known allergies.   Social History   Tobacco Use  . Smoking status: Former Smoker    Quit date: 09/05/2003    Years since quitting: 16.1  . Smokeless tobacco: Never Used  Substance Use Topics  . Alcohol use: Not Currently  . Drug use: Not Currently     Family Hx: The patient's family history includes CAD in his brother.  ROS:   Please see the history of present illness.     All other systems reviewed and are negative.   Prior CV studies:   The following studies were reviewed today:  Cardiac Catheterization: 10/2018  Mid LM lesion is 10% stenosed.  Prox Cx to Mid Cx lesion is 25% stenosed.  Prox LAD lesion is 25% stenosed.  There is moderate left ventricular systolic dysfunction.  There is no aortic valve stenosis.  Hemodynamic findings consistent with mild pulmonary hypertension.  Ao sat 98%, PA sat 72%, PA mean 33 mm Hg, PCWP mean 31 mm Hg; CO 5.99 L/min; CI 2.63   Nonobstructive CAD.  Continue medical therapy for nonischemic cardiomyopathy.    Limited Echo: 10/2019 IMPRESSIONS    1. Left ventricular ejection fraction, by visual estimation, is 30 to  35%. The left ventricle has moderate to severely decreased function. There  is mildly increased left ventricular wall thickness.  2. The left ventricle demonstrates global hypokinesis.  3. Global right ventricle has low normal systolic function.The right  ventricular size is normal. mildly increased right ventricular wall  thickness.  4. The mitral valve is degenerative.  5. The tricuspid valve was grossly normal.  6. No evidence of aortic valve sclerosis or stenosis.  7. Aortic root could not be assessed.  8. The  interatrial septum was not assessed.   Labs/Other Tests and Data Reviewed:    EKG:  An ECG dated 05/10/2019 was personally reviewed today and demonstrated: NSR, HR 92 with known LBBB.   Recent Labs: 02/01/2019: ALT 15; Hemoglobin 16.6; Platelets 209 05/10/2019: Magnesium 2.0 07/08/2019: BUN 14; Creatinine, Ser 1.33; Potassium 4.6; Sodium 135   Recent Lipid Panel No results found for: CHOL, TRIG, HDL, CHOLHDL, LDLCALC, LDLDIRECT  Wt Readings from Last 3 Encounters:  10/05/19 225 lb (102.1 kg)  07/15/19 219 lb (99.3 kg)  06/03/19 227 lb (103 kg)     Objective:    Vital Signs:  There were no vitals taken for this visit.   General: Pleasant male sounding in NAD Psych: Normal affect. Neuro: Alert and oriented X 3.  Lungs:  Resp regular and unlabored while talking on the phone.    ASSESSMENT & PLAN:    1. Chronic Combined Systolic and Diastolic CHF - EF was previously reduced at 20 to  25% by echocardiogram in 10/2018 and has improved some but still remains reduced at 30 to 35% by most recent imaging earlier this month. - He has overall done well from a fluid perspective and denies any recent orthopnea, PND or lower extremity edema. Utilizes Lasix less than once every few weeks. - Continue with medical therapy including Coreg 25 mg twice daily, Hydralazine 50 mg BID, Entresto 97-103mg  BID and Spironolactone 25mg  daily. Given that his EF remains reduced and he has a known LBBB, will refer to EP for consideration of ICD/CRT which he is in agreement with proceeding with if indicated.   2. CAD - cath in 2016 showed mild nonobstructive disease with similar findings by repeat cath in 10/2018 as outlined above.  - continue BB and statin therapy. Not on ASA given the need for anticoagulation (has been on Xarelto given history of recurrent PE).   3. HTN - BP well controlled at 128/74 on most recent check and he reports SBP is typically in the 110's to 130's. Continue current medication  regimen.  4. HLD - Followed by PCP. LDL at 66 when checked in 05/2019. He remains on Atorvastatin 20 mg daily.  5. Mitral Regurgitation - Mild to moderate by echocardiogram in 04/2019.  Continue to follow.    COVID-19 Education: The signs and symptoms of COVID-19 were discussed with the patient and how to seek care for testing (follow up with PCP or arrange E-visit). The importance of social distancing was discussed today.  Time:   Today, I have spent 17 minutes with the patient with telehealth technology discussing the above problems.     Medication Adjustments/Labs and Tests Ordered: Current medicines are reviewed at length with the patient today.  Concerns regarding medicines are outlined above.   Tests Ordered: No orders of the defined types were placed in this encounter.   Medication Changes: No orders of the defined types were placed in this encounter.   Follow Up: With Dr. Lovena Le (EP) on 10/26/2019 then with Dr. Harl Bowie in 3-4 months  Signed, Erma Heritage, PA-C  10/12/2019 6:30 AM    [place]

## 2019-10-12 NOTE — Patient Instructions (Addendum)
Medication Instructions:  Your physician recommends that you continue on your current medications as directed. Please refer to the Current Medication list given to you today.  *If you need a refill on your cardiac medications before your next appointment, please call your pharmacy*  Lab Work: NONE If you have labs (blood work) drawn today and your tests are completely normal, you will receive your results only by: [name] MyChart Message (if you have MyChart) OR . A paper copy in the mail If you have any lab test that is abnormal or we need to change your treatment, we will call you to review the results.  Testing/Procedures: NONE   Follow-Up: At [facility], you and your health needs are our priority.  As part of our continuing mission to provide you with exceptional heart care, we have created designated Provider Care Teams.  These Care Teams include your primary Cardiologist (physician) and Advanced Practice Providers (APPs -  Physician Assistants and Nurse Practitioners) who all work together to provide you with the care you need, when you need it.  Your next appointment:   3-4  month(s)  The format for your next appointment:   In Person  Provider:   DR. Harl Bowie    Other Instructions You have been referred to Dr. Knox Saliva have an appointment on 10/26/19 to discuss ICD/CRT.   Thank you for choosing [facility]!

## 2019-10-13 ENCOUNTER — Ambulatory Visit (HOSPITAL_COMMUNITY): Payer: Medicare HMO | Admitting: Physical Therapy

## 2019-10-13 ENCOUNTER — Telehealth (HOSPITAL_COMMUNITY): Payer: Self-pay | Admitting: Physical Therapy

## 2019-10-13 NOTE — Telephone Encounter (Signed)
pt wants to cancel due to he is not feeling well

## 2019-10-15 DIAGNOSIS — I5022 Chronic systolic (congestive) heart failure: Secondary | ICD-10-CM | POA: Diagnosis not present

## 2019-10-15 DIAGNOSIS — J9601 Acute respiratory failure with hypoxia: Secondary | ICD-10-CM | POA: Diagnosis not present

## 2019-10-15 DIAGNOSIS — I5032 Chronic diastolic (congestive) heart failure: Secondary | ICD-10-CM | POA: Diagnosis not present

## 2019-10-17 ENCOUNTER — Ambulatory Visit (HOSPITAL_COMMUNITY): Payer: Medicare HMO | Admitting: Physical Therapy

## 2019-10-18 ENCOUNTER — Encounter (HOSPITAL_COMMUNITY): Payer: Medicare HMO | Admitting: Physical Therapy

## 2019-10-19 ENCOUNTER — Encounter (HOSPITAL_COMMUNITY): Payer: Self-pay | Admitting: Physical Therapy

## 2019-10-19 ENCOUNTER — Other Ambulatory Visit: Payer: Self-pay

## 2019-10-19 ENCOUNTER — Ambulatory Visit (HOSPITAL_COMMUNITY): Payer: Medicare HMO | Admitting: Physical Therapy

## 2019-10-19 DIAGNOSIS — M6281 Muscle weakness (generalized): Secondary | ICD-10-CM

## 2019-10-19 DIAGNOSIS — R293 Abnormal posture: Secondary | ICD-10-CM

## 2019-10-19 DIAGNOSIS — M542 Cervicalgia: Secondary | ICD-10-CM

## 2019-10-19 DIAGNOSIS — R29898 Other symptoms and signs involving the musculoskeletal system: Secondary | ICD-10-CM

## 2019-10-19 NOTE — Therapy (Signed)
[place], [place], [address] Phone: [phone]   Fax:  [phone]  Physical Therapy Treatment  Patient Details  Name: Roger Lowe. MRN: [id number] Date of Birth: Dec 07, 1945 Referring Provider (PT): Melina Schools MD   Encounter Date: 10/19/2019  PT End of Session - 10/19/19 1310    Visit Number  8    Number of Visits  12    Date for PT Re-Evaluation  10/27/19    Authorization Type  Primary: [employer] Medicare HMO Secondary: Medicaid of [place] (yes auth)    Authorization Time Period  09/15/19-10/27/19    Authorization - Visit Number  8    Authorization - Number of Visits  10    PT Start Time  1301    PT Stop Time  1343    PT Time Calculation (min)  42 min    Equipment Utilized During Treatment  Oxygen    Activity Tolerance  Patient tolerated treatment well    Behavior During Therapy  WFL for tasks assessed/performed       Past Medical History:  Diagnosis Date  . Asthma   . Cancer ([place])   . CHF (congestive heart failure) ([place])    a. EF 45-50% by echo in 07/2017 b. EF reduced to 20-25% by repeat echo in 10/2018  . Coronary artery disease    a. cath in 2016 showing mild nonobstructive disease b. cath in 10/2018 showing nonobstructive CAD with 10% LM stenosis, 25% Proximal-LAD, 25% LCx, and mild pulmonary HTN  . Diabetes mellitus without complication ([place])   . Gout   . History of pulmonary embolus (PE) 2016  . Hypertension   . Lung cancer ([place])   . Pneumonia     Past Surgical History:  Procedure Laterality Date  . CERVICAL SPINE SURGERY    . RIGHT/LEFT HEART CATH AND CORONARY ANGIOGRAPHY N/A 11/08/2018   Procedure: RIGHT/LEFT HEART CATH AND CORONARY ANGIOGRAPHY;  Surgeon: [name], MD;  Location: [facility];  Service: Cardiovascular;  Laterality: N/A;    There were no vitals filed for this visit.  Subjective Assessment - 10/19/19 1301    Subjective  Patient reports his hip has been  bothering him more than his neck. He states over the weekend his left arm "went dead" on hip and he could not feel it but knew it was there. He states he was having pain and numbness at the same time in his left arm all the way to his fingers. He has not really been doing any of his exercises. He uses his home traction unit and it really seems to help.    Pertinent History  C7-T1 Fusion in 1995, CHF, DM, lung cancer, COPD    Limitations  Other (comment)   lying down   How long can you stand comfortably?  unrestricted    How long can you walk comfortably?  3-4 blocks slowly    Patient Stated Goals  to decrease symptoms    Currently in Pain?  Yes    Pain Score  6     Pain Onset  More than a month ago                       [facility] Adult PT Treatment/Exercise - 10/19/19 0001      Neck Exercises: Supine   Other Supine Exercise  cervical retraction in supine 2x10      Manual Therapy   Manual Therapy  Joint mobilization;Soft tissue mobilization;Passive ROM;Manual Traction    Manual therapy comments  all manual interventions performed independently of other interventions    Joint Mobilization  medial glides and PAs to C2-6 bilaterall grade II-III for pain    Soft tissue mobilization  STM to bilateral cervical paraspinals, SCM, suboccipitals    Passive ROM  manual cervical retraction in supine 2x10 with OP    Manual Traction  cerivcal traction - performed by PT - continuous, x5 30-60 seconds holds - felt good             PT Education - 10/19/19 1309    Education Details  Patient educated on HEP, proper frequency and positioning of exercises.    Person(s) Educated  Patient    Methods  Explanation    Comprehension  Verbalized understanding       PT Short Term Goals - 09/21/19 1213      PT SHORT TERM GOAL #1   Title  Patient will be independent with HEP in order to improve functional outcomes.    Time  3    Period  Weeks    Status  On-going    Target Date  10/06/19       PT SHORT TERM GOAL #2   Title  Patient will report 25% improvment in symptoms for improved quality of life.    Time  3    Period  Weeks    Status  On-going    Target Date  10/06/19        PT Long Term Goals - 09/21/19 1213      PT LONG TERM GOAL #1   Title  Patient will report at least 75% improvement in symptoms in order to improve quality of life.    Time  6    Period  Weeks    Status  On-going    Target Date  10/27/19      PT LONG TERM GOAL #2   Title  Patient will improve foto score by at least 7 points in order to demonstrate improved quality of life.    Baseline  52% limited    Time  6    Period  Weeks    Status  On-going    Target Date  10/27/19      PT LONG TERM GOAL #3   Title  Patient will improve cervical ROM by at least 25% in all planes in order to be able to look around while riding in the car.    Time  6    Period  Weeks    Status  On-going    Target Date  10/27/19      PT LONG TERM GOAL #4   Title  Patient will report centralized cervical symptoms no greater than 2/10 for improved ability to complete all ADL.    Time  6    Period  Weeks    Status  On-going    Target Date  10/27/19            Plan - 10/19/19 1311    Clinical Impression Statement  Patient continues to feel reduction in symptoms following manual therapy interventions. He demonstrates improving cervical mobility following manual therapy. When attempting to perform cervical retractions he sets up with his head away from midline and requires manual assist for proper positioning. He continues to require tactile and verbal cueing for performance of cervical retraction. He compensates with SCM activation due to impaired ability to utilize deep neck flexors. Patient  feels better following session and is making slow/min progress up to this point. Patient will continue to benefit from skilled physical therapy to improve function and reduce impairment.    Personal Factors and Comorbidities   Age;Behavior Pattern;Comorbidity 3+;Time since onset of injury/illness/exacerbation;Past/Current Experience    Comorbidities  C5-C6 per MRN and patient report (C7-T1 per referral) Fusion in 1995, CHF, DM, lung cancer, COPD    Examination-Activity Limitations  Bed Mobility;Bend;Carry;Lift;Locomotion Level;Sit;Sleep    Examination-Participation Restrictions  Church;Cleaning;Driving;Laundry;Meal Prep;Shop;Volunteer;Yard Work    Merchant navy officer  Evolving/Moderate complexity    Rehab Potential  Fair    PT Frequency  2x / week    PT Duration  6 weeks    PT Treatment/Interventions  ADLs/Self Care Home Management;Canalith Repostioning;Electrical Stimulation;Cryotherapy;Moist Heat;Traction;Ultrasound;Functional mobility training;Therapeutic activities;Therapeutic exercise;Neuromuscular re-education;Patient/family education;Manual techniques;Passive range of motion;Dry needling;Spinal Manipulations;Joint Manipulations;Energy conservation;Taping;Vestibular    PT Next Visit Plan  continue with cerivcal mobilization, traction, STM and active cerivcal ROM. Investigate/improve thoracic mobility as able; possibly add SNAGs with rotation, UT stretch, thoracic mobility, postural strengthening as able    PT Home Exercise Plan  cerivcal SNAGs into extension(discontinued for now) 09/22/19: cervical retraction in supine 2x10; cervical excursion exercises. (discontinued for now)    Consulted and Agree with Plan of Care  Patient       Patient will benefit from skilled therapeutic intervention in order to improve the following deficits and impairments:  Decreased activity tolerance, Decreased endurance, Decreased mobility, Decreased range of motion, Hypomobility, Decreased strength, Increased muscle spasms, Impaired sensation, Impaired tone, Impaired UE functional use, Improper body mechanics, Postural dysfunction, Pain  Visit Diagnosis: Cervicalgia  Other symptoms and signs involving the  musculoskeletal system  Abnormal posture  Muscle weakness (generalized)     Problem List Patient Active Problem List   Diagnosis Date Noted  . On continuous oral anticoagulation 05/10/2019  . Mediastinal adenopathy 05/10/2019  . Hilar adenopathy 05/10/2019  . H/O: lung cancer 05/10/2019  . Chronic hypoxemic respiratory failure ([place]) 05/10/2019  . Pleural effusion, bilateral 05/10/2019  . DNR (do not resuscitate) 05/10/2019  . Acute systolic heart failure ([place])   . Community acquired pneumonia of left lower lobe of lung 10/12/2018  . SIRS (systemic inflammatory response syndrome) ([place]) 10/12/2018  . Uncontrolled hypertension 10/12/2018  . Type 2 diabetes mellitus without complication, with long-term current use of insulin ([place]) 10/12/2018  . Chest pain 10/12/2018  . Prolonged QT interval 10/12/2018    1:46 PM, 10/19/19 Mearl Latin PT, DPT Physical Therapist at [facility] [place], [place], [address] Phone: [phone]   Fax:  [phone]  Name: Gavon Majano. MRN: [id number] Date of Birth: [birth year]

## 2019-10-20 ENCOUNTER — Ambulatory Visit (HOSPITAL_COMMUNITY): Payer: Medicare HMO | Admitting: Physical Therapy

## 2019-10-21 DIAGNOSIS — M961 Postlaminectomy syndrome, not elsewhere classified: Secondary | ICD-10-CM | POA: Diagnosis not present

## 2019-10-21 DIAGNOSIS — M542 Cervicalgia: Secondary | ICD-10-CM | POA: Diagnosis not present

## 2019-10-24 ENCOUNTER — Ambulatory Visit (HOSPITAL_COMMUNITY): Payer: Medicare HMO | Admitting: Physical Therapy

## 2019-10-24 ENCOUNTER — Encounter (HOSPITAL_COMMUNITY): Payer: Self-pay | Admitting: Physical Therapy

## 2019-10-24 ENCOUNTER — Other Ambulatory Visit: Payer: Self-pay

## 2019-10-24 DIAGNOSIS — M6281 Muscle weakness (generalized): Secondary | ICD-10-CM

## 2019-10-24 DIAGNOSIS — M542 Cervicalgia: Secondary | ICD-10-CM

## 2019-10-24 DIAGNOSIS — R29898 Other symptoms and signs involving the musculoskeletal system: Secondary | ICD-10-CM

## 2019-10-24 DIAGNOSIS — R293 Abnormal posture: Secondary | ICD-10-CM | POA: Diagnosis not present

## 2019-10-24 NOTE — Therapy (Signed)
[place], [place], [address] Phone: [phone]   Fax:  [phone]  Physical Therapy Treatment  Patient Details  Name: Roger Lowe. MRN: [id number] Date of Birth: [birth year] Referring Provider (PT): Melina Schools MD   Encounter Date: 10/24/2019  PT End of Session - 10/24/19 1312    Visit Number  9    Number of Visits  12    Date for PT Re-Evaluation  10/27/19    Authorization Type  Primary: [employer] Medicare HMO Secondary: Medicaid of [place] (yes auth)    Authorization Time Period  09/15/19-10/27/19    Authorization - Visit Number  9    Authorization - Number of Visits  10    PT Start Time  1310   Patient arived late   PT Stop Time  1345    PT Time Calculation (min)  35 min    Equipment Utilized During Treatment  Oxygen    Activity Tolerance  Patient tolerated treatment well    Behavior During Therapy  [facility] for tasks assessed/performed       Past Medical History:  Diagnosis Date  . Asthma   . Cancer ([place])   . CHF (congestive heart failure) ([place])    a. EF 45-50% by echo in 07/2017 b. EF reduced to 20-25% by repeat echo in 10/2018  . Coronary artery disease    a. cath in 2016 showing mild nonobstructive disease b. cath in 10/2018 showing nonobstructive CAD with 10% LM stenosis, 25% Proximal-LAD, 25% LCx, and mild pulmonary HTN  . Diabetes mellitus without complication ([place])   . Gout   . History of pulmonary embolus (PE) 2016  . Hypertension   . Lung cancer ([place])   . Pneumonia     Past Surgical History:  Procedure Laterality Date  . CERVICAL SPINE SURGERY    . RIGHT/LEFT HEART CATH AND CORONARY ANGIOGRAPHY N/A 11/08/2018   Procedure: RIGHT/LEFT HEART CATH AND CORONARY ANGIOGRAPHY;  Surgeon: [name], MD;  Location: [facility];  Service: Cardiovascular;  Laterality: N/A;    There were no vitals filed for this visit.  Subjective Assessment - 10/24/19 1311    Subjective  Patient reported  that his neck was bothering him over the weekend. He reported that currently he is having discomfort, but not really pain.    Pertinent History  C7-T1 Fusion in 1995, CHF, DM, lung cancer, COPD    Limitations  Other (comment)   lying down   How long can you stand comfortably?  unrestricted    How long can you walk comfortably?  3-4 blocks slowly    Patient Stated Goals  to decrease symptoms    Currently in Pain?  No/denies                       [facility] Adult PT Treatment/Exercise - 10/24/19 0001      Neck Exercises: Supine   Other Supine Exercise  cervical retraction in supine 2x10      Manual Therapy   Manual Therapy  Joint mobilization;Passive ROM;Manual Traction    Manual therapy comments  all manual interventions performed independently of other interventions    Joint Mobilization  medial glides and PAs to C2-6 bilaterall grade II-III for pain    Passive ROM  manual cervical retraction in supine 2x10 with OP    Manual Traction  cerivcal traction - performed by PT - continuous, x5 30-60 seconds holds - felt good  PT Education - 10/24/19 1323    Education Details  Discussed what patient had discussed with his MD after him following up with him and discussed benefits of therapy.    Person(s) Educated  Patient    Methods  Explanation    Comprehension  Verbalized understanding       PT Short Term Goals - 09/21/19 1213      PT SHORT TERM GOAL #1   Title  Patient will be independent with HEP in order to improve functional outcomes.    Time  3    Period  Weeks    Status  On-going    Target Date  10/06/19      PT SHORT TERM GOAL #2   Title  Patient will report 25% improvment in symptoms for improved quality of life.    Time  3    Period  Weeks    Status  On-going    Target Date  10/06/19        PT Long Term Goals - 09/21/19 1213      PT LONG TERM GOAL #1   Title  Patient will report at least 75% improvement in symptoms in order to  improve quality of life.    Time  6    Period  Weeks    Status  On-going    Target Date  10/27/19      PT LONG TERM GOAL #2   Title  Patient will improve foto score by at least 7 points in order to demonstrate improved quality of life.    Baseline  52% limited    Time  6    Period  Weeks    Status  On-going    Target Date  10/27/19      PT LONG TERM GOAL #3   Title  Patient will improve cervical ROM by at least 25% in all planes in order to be able to look around while riding in the car.    Time  6    Period  Weeks    Status  On-going    Target Date  10/27/19      PT LONG TERM GOAL #4   Title  Patient will report centralized cervical symptoms no greater than 2/10 for improved ability to complete all ADL.    Time  6    Period  Weeks    Status  On-going    Target Date  10/27/19            Plan - 10/24/19 1422    Clinical Impression Statement  Patient was late this session, therefore session was limited due to this. Discussed with patient at length his visit with his MD and discussed the benefits of therapy as patient's MD told him he does not feel comfortable proceeding with surgery as an option currently. Focused on manual therapy to improve patient's mobility and decrease pain. Did note increased tenderness around C2-3 TP on the right side focused a little more time in this area and patient did report feeling good following manual therapy.    Personal Factors and Comorbidities  Age;Behavior Pattern;Comorbidity 3+;Time since onset of injury/illness/exacerbation;Past/Current Experience    Comorbidities  C5-C6 per MRN and patient report (C7-T1 per referral) Fusion in 1995, CHF, DM, lung cancer, COPD    Examination-Activity Limitations  Bed Mobility;Bend;Carry;Lift;Locomotion Level;Sit;Sleep    Examination-Participation Restrictions  Church;Cleaning;Driving;Laundry;Meal Prep;Shop;Volunteer;Yard Work    Market researcher  PT Frequency  2x / week    PT Duration  6 weeks    PT Treatment/Interventions  ADLs/Self Care Home Management;Canalith Repostioning;Electrical Stimulation;Cryotherapy;Moist Heat;Traction;Ultrasound;Functional mobility training;Therapeutic activities;Therapeutic exercise;Neuromuscular re-education;Patient/family education;Manual techniques;Passive range of motion;Dry needling;Spinal Manipulations;Joint Manipulations;Energy conservation;Taping;Vestibular    PT Next Visit Plan  Progress note. continue with cerivcal mobilization, traction, STM and active cerivcal ROM. Investigate/improve thoracic mobility as able; possibly add SNAGs with rotation, UT stretch, thoracic mobility, postural strengthening as able    PT Home Exercise Plan  cerivcal SNAGs into extension(discontinued for now) 09/22/19: cervical retraction in supine 2x10; cervical excursion exercises. (discontinued for now)    Consulted and Agree with Plan of Care  Patient       Patient will benefit from skilled therapeutic intervention in order to improve the following deficits and impairments:  Decreased activity tolerance, Decreased endurance, Decreased mobility, Decreased range of motion, Hypomobility, Decreased strength, Increased muscle spasms, Impaired sensation, Impaired tone, Impaired UE functional use, Improper body mechanics, Postural dysfunction, Pain  Visit Diagnosis: Cervicalgia  Other symptoms and signs involving the musculoskeletal system  Abnormal posture  Muscle weakness (generalized)     Problem List Patient Active Problem List   Diagnosis Date Noted  . On continuous oral anticoagulation 05/10/2019  . Mediastinal adenopathy 05/10/2019  . Hilar adenopathy 05/10/2019  . H/O: lung cancer 05/10/2019  . Chronic hypoxemic respiratory failure ([place]) 05/10/2019  . Pleural effusion, bilateral 05/10/2019  . DNR (do not resuscitate) 05/10/2019  . Acute systolic heart failure ([place])   . Community acquired  pneumonia of left lower lobe of lung 10/12/2018  . SIRS (systemic inflammatory response syndrome) ([place]) 10/12/2018  . Uncontrolled hypertension 10/12/2018  . Type 2 diabetes mellitus without complication, with long-term current use of insulin ([place]) 10/12/2018  . Chest pain 10/12/2018  . Prolonged QT interval 10/12/2018   Clarene Critchley PT, DPT 2:25 PM, 10/24/19 [place], [place], [address] Phone: [phone]   Fax:  [phone]  Name: Linford Quintela. MRN: [id number] Date of Birth: 11-Jul-1946

## 2019-10-25 ENCOUNTER — Encounter (HOSPITAL_COMMUNITY): Payer: Medicare HMO | Admitting: Physical Therapy

## 2019-10-25 ENCOUNTER — Other Ambulatory Visit: Payer: Self-pay

## 2019-10-25 DIAGNOSIS — M79675 Pain in left toe(s): Secondary | ICD-10-CM | POA: Diagnosis not present

## 2019-10-25 MED ORDER — SPIRIVA RESPIMAT 2.5 MCG/ACT IN AERS: 2.0000 | INHALATION_SPRAY | Freq: Every day | RESPIRATORY_TRACT | 3 refills | Status: DC

## 2019-10-26 ENCOUNTER — Ambulatory Visit (INDEPENDENT_AMBULATORY_CARE_PROVIDER_SITE_OTHER): Payer: Medicare HMO | Admitting: Internal Medicine

## 2019-10-26 ENCOUNTER — Encounter: Payer: Self-pay | Admitting: *Deleted

## 2019-10-26 ENCOUNTER — Encounter: Payer: Self-pay | Admitting: Internal Medicine

## 2019-10-26 VITALS — BP 132/81 | HR 104 | Temp 97.1°F | Ht 72.0 in | Wt 221.8 lb

## 2019-10-26 DIAGNOSIS — J449 Chronic obstructive pulmonary disease, unspecified: Secondary | ICD-10-CM | POA: Diagnosis not present

## 2019-10-26 DIAGNOSIS — Z01818 Encounter for other preprocedural examination: Secondary | ICD-10-CM

## 2019-10-26 DIAGNOSIS — I5022 Chronic systolic (congestive) heart failure: Secondary | ICD-10-CM | POA: Diagnosis not present

## 2019-10-26 DIAGNOSIS — Z01810 Encounter for preprocedural cardiovascular examination: Secondary | ICD-10-CM | POA: Diagnosis not present

## 2019-10-26 NOTE — Patient Instructions (Signed)
Medication Instructions:  Your physician recommends that you continue on your current medications as directed. Please refer to the Current Medication list given to you today.  *If you need a refill on your cardiac medications before your next appointment, please call your pharmacy*  Lab Work: Your physician recommends that you return for lab work in: 11/04/19   If you have labs (blood work) drawn today and your tests are completely normal, you will receive your results only by: [name] MyChart Message (if you have MyChart) OR . A paper copy in the mail If you have any lab test that is abnormal or we need to change your treatment, we will call you to review the results.  Testing/Procedures: NONE   Follow-Up: At [facility], you and your health needs are our priority.  As part of our continuing mission to provide you with exceptional heart care, we have created designated Provider Care Teams.  These Care Teams include your primary Cardiologist (physician) and Advanced Practice Providers (APPs -  Physician Assistants and Nurse Practitioners) who all work together to provide you with the care you need, when you need it.  Your next appointment:    91 Days   The format for your next appointment:   In Person  Provider:   Cristopher Peru, MD  Other Instructions Thank you for choosing [facility]!

## 2019-10-26 NOTE — H&P (View-Only) (Signed)
HPI Roger Lowe is referred by Dr. Harl Bowie to discuss ICD insertion. He is a pleasant 74 yo man with a h/o chronic systolic heart failure, COPD on oxygen, non-obstructive CAD, and LBBB. The patient has not had syncope. He has class 2 CHF symptoms though it is unclear what amount of his sob is due to COPD on oxygen. The patient has minimal peripheral edema.  No Known Allergies   Current Outpatient Medications  Medication Sig Dispense Refill  . allopurinol (ZYLOPRIM) 300 MG tablet Take 300 mg by mouth daily.    [name] amitriptyline (ELAVIL) 50 MG tablet Take 50 mg by mouth 2 (two) times daily.    [name] arformoterol (BROVANA) 15 MCG/2ML NEBU Take 2 mLs (15 mcg total) by nebulization 2 (two) times daily. 120 mL 6  . atorvastatin (LIPITOR) 20 MG tablet Take 1 tablet (20 mg total) by mouth at bedtime. 90 tablet 3  . budesonide (PULMICORT) 0.5 MG/2ML nebulizer solution Take 2 mLs (0.5 mg total) by nebulization 2 (two) times daily. 120 mL 12  . budesonide-formoterol (SYMBICORT) 160-4.5 MCG/ACT inhaler Inhale 2 puffs into the lungs 2 (two) times daily.    . carvedilol (COREG) 25 MG tablet Take 1 tablet (25 mg total) by mouth 2 (two) times daily. 180 tablet 3  . famotidine (PEPCID) 40 MG tablet Take 40 mg by mouth 2 (two) times daily.     . furosemide (LASIX) 20 MG tablet Take 1 tablet (20 mg total) by mouth daily as needed for fluid. 90 tablet 3  . hydrALAZINE (APRESOLINE) 50 MG tablet Take 1 tablet (50 mg total) by mouth 2 (two) times daily. 180 tablet 3  . HYDROcodone-acetaminophen (NORCO) 10-325 MG tablet Take 1 tablet by mouth every 6 (six) hours as needed for moderate pain. 12 tablet 0  . insulin aspart (NOVOLOG FLEXPEN) 100 UNIT/ML FlexPen Inject 25 Units into the skin 3 (three) times daily with meals. Plus personal sliding scale    . Insulin Detemir (LEVEMIR FLEXTOUCH) 100 UNIT/ML Pen Inject 45 Units into the skin at bedtime. 15 mL 0  . ipratropium-albuterol (DUONEB) 0.5-2.5 (3) MG/3ML SOLN Take 3  mLs by nebulization every 4 (four) hours as needed. (Patient taking differently: Take 3 mLs by nebulization every 4 (four) hours as needed (shortness of breath). ) 180 mL 0  . neomycin-polymyxin-hydrocortisone (CORTISPORIN) 3.5-10000-1 OTIC suspension Place 4 drops into the left ear 4 (four) times daily. X 7 days 10 mL 0  . nitroGLYCERIN (NITROSTAT) 0.4 MG SL tablet Place 1 tablet (0.4 mg total) under the tongue every 5 (five) minutes as needed for chest pain. 25 tablet 2  . rivaroxaban (XARELTO) 20 MG TABS tablet Take 1 tablet (20 mg total) by mouth every morning. 90 tablet 3  . sacubitril-valsartan (ENTRESTO) 97-103 MG Take 1 tablet by mouth 2 (two) times daily. 60 tablet 9  . spironolactone (ALDACTONE) 25 MG tablet Take 1 tablet (25 mg total) by mouth daily. 90 tablet 3  . Tiotropium Bromide Monohydrate (SPIRIVA RESPIMAT) 2.5 MCG/ACT AERS Inhale 2 puffs into the lungs daily. 4 g 3   No current facility-administered medications for this visit.     Past Medical History:  Diagnosis Date  . Asthma   . Cancer ([place])   . CHF (congestive heart failure) ([place])    a. EF 45-50% by echo in 07/2017 b. EF reduced to 20-25% by repeat echo in 10/2018  . Coronary artery disease    a. cath in 2016 showing mild nonobstructive  disease b. cath in 10/2018 showing nonobstructive CAD with 10% LM stenosis, 25% Proximal-LAD, 25% LCx, and mild pulmonary HTN  . Diabetes mellitus without complication ([place])   . Gout   . History of pulmonary embolus (PE) 2016  . Hypertension   . Lung cancer ([place])   . Pneumonia     ROS:   All systems reviewed and negative except as noted in the HPI.   Past Surgical History:  Procedure Laterality Date  . CERVICAL SPINE SURGERY    . RIGHT/LEFT HEART CATH AND CORONARY ANGIOGRAPHY N/A 11/08/2018   Procedure: RIGHT/LEFT HEART CATH AND CORONARY ANGIOGRAPHY;  Surgeon: [name], MD;  Location: [facility];  Service: Cardiovascular;  Laterality: N/A;     Family  History  Problem Relation Age of Onset  . CAD Brother      Social History   Socioeconomic History  . Marital status: Single    Spouse name: Not on file  . Number of children: Not on file  . Years of education: Not on file  . Highest education level: Not on file  Occupational History  . Not on file  Tobacco Use  . Smoking status: Former Smoker    Quit date: 09/05/2003    Years since quitting: 16.1  . Smokeless tobacco: Never Used  Substance and Sexual Activity  . Alcohol use: Not Currently  . Drug use: Not Currently  . Sexual activity: Not on file  Other Topics Concern  . Not on file  Social History Narrative  . Not on file   Social Determinants of Health   Financial Resource Strain:   . Difficulty of Paying Living Expenses: Not on file  Food Insecurity:   . Worried About Charity fundraiser in the Last Year: Not on file  . Ran Out of Food in the Last Year: Not on file  Transportation Needs:   . Lack of Transportation (Medical): Not on file  . Lack of Transportation (Non-Medical): Not on file  Physical Activity:   . Days of Exercise per Week: Not on file  . Minutes of Exercise per Session: Not on file  Stress:   . Feeling of Stress : Not on file  Social Connections:   . Frequency of Communication with Friends and Family: Not on file  . Frequency of Social Gatherings with Friends and Family: Not on file  . Attends Religious Services: Not on file  . Active Member of Clubs or Organizations: Not on file  . Attends Archivist Meetings: Not on file  . Marital Status: Not on file  Intimate Partner Violence:   . Fear of Current or Ex-Partner: Not on file  . Emotionally Abused: Not on file  . Physically Abused: Not on file  . Sexually Abused: Not on file     BP 132/81   Pulse (!) 104   Temp (!) 97.1 F (36.2 C)   Ht 6' (1.829 m)   Wt 221 lb 12.8 oz (100.6 kg)   BMI 30.08 kg/m   Physical Exam:  stable appearing 74 yo man, wearing oxygen and a mask,  NAD HEENT: Unremarkable Neck:  6 cm JVD, no thyromegally Lymphatics:  No adenopathy Back:  No CVA tenderness Lungs:  Clear with no wheezes HEART:  Regular rate rhythm, no murmurs, no rubs, no clicks Abd:  soft, positive bowel sounds, no organomegally, no rebound, no guarding Ext:  2 plus pulses, no edema, no cyanosis, no clubbing Skin:  No rashes no nodules  Neuro:  CN II through XII intact, motor grossly intact  EKG - reviewed, NSR with LBBB   Assess/Plan: 1. Chronic systolic heart failure - he has class 3 symptoms and LBBB despite maximal medical therapy. I have discussed the indications/risks/benefits/goals/expectations of biv ICD insertion and he wishes to proceed. 2. COPD - he has been on oxygen therapy for 6 years. He is stable.  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  Roger Lowe.D.

## 2019-10-26 NOTE — Progress Notes (Signed)
HPI Roger Lowe is referred by Dr. Harl Bowie to discuss ICD insertion. He is a pleasant 74 yo man with a h/o chronic systolic heart failure, COPD on oxygen, non-obstructive CAD, and LBBB. The patient has not had syncope. He has class 2 CHF symptoms though it is unclear what amount of his sob is due to COPD on oxygen. The patient has minimal peripheral edema.  No Known Allergies   Current Outpatient Medications  Medication Sig Dispense Refill  . allopurinol (ZYLOPRIM) 300 MG tablet Take 300 mg by mouth daily.    [name] amitriptyline (ELAVIL) 50 MG tablet Take 50 mg by mouth 2 (two) times daily.    [name] arformoterol (BROVANA) 15 MCG/2ML NEBU Take 2 mLs (15 mcg total) by nebulization 2 (two) times daily. 120 mL 6  . atorvastatin (LIPITOR) 20 MG tablet Take 1 tablet (20 mg total) by mouth at bedtime. 90 tablet 3  . budesonide (PULMICORT) 0.5 MG/2ML nebulizer solution Take 2 mLs (0.5 mg total) by nebulization 2 (two) times daily. 120 mL 12  . budesonide-formoterol (SYMBICORT) 160-4.5 MCG/ACT inhaler Inhale 2 puffs into the lungs 2 (two) times daily.    . carvedilol (COREG) 25 MG tablet Take 1 tablet (25 mg total) by mouth 2 (two) times daily. 180 tablet 3  . famotidine (PEPCID) 40 MG tablet Take 40 mg by mouth 2 (two) times daily.     . furosemide (LASIX) 20 MG tablet Take 1 tablet (20 mg total) by mouth daily as needed for fluid. 90 tablet 3  . hydrALAZINE (APRESOLINE) 50 MG tablet Take 1 tablet (50 mg total) by mouth 2 (two) times daily. 180 tablet 3  . HYDROcodone-acetaminophen (NORCO) 10-325 MG tablet Take 1 tablet by mouth every 6 (six) hours as needed for moderate pain. 12 tablet 0  . insulin aspart (NOVOLOG FLEXPEN) 100 UNIT/ML FlexPen Inject 25 Units into the skin 3 (three) times daily with meals. Plus personal sliding scale    . Insulin Detemir (LEVEMIR FLEXTOUCH) 100 UNIT/ML Pen Inject 45 Units into the skin at bedtime. 15 mL 0  . ipratropium-albuterol (DUONEB) 0.5-2.5 (3) MG/3ML SOLN Take 3  mLs by nebulization every 4 (four) hours as needed. (Patient taking differently: Take 3 mLs by nebulization every 4 (four) hours as needed (shortness of breath). ) 180 mL 0  . neomycin-polymyxin-hydrocortisone (CORTISPORIN) 3.5-10000-1 OTIC suspension Place 4 drops into the left ear 4 (four) times daily. X 7 days 10 mL 0  . nitroGLYCERIN (NITROSTAT) 0.4 MG SL tablet Place 1 tablet (0.4 mg total) under the tongue every 5 (five) minutes as needed for chest pain. 25 tablet 2  . rivaroxaban (XARELTO) 20 MG TABS tablet Take 1 tablet (20 mg total) by mouth every morning. 90 tablet 3  . sacubitril-valsartan (ENTRESTO) 97-103 MG Take 1 tablet by mouth 2 (two) times daily. 60 tablet 9  . spironolactone (ALDACTONE) 25 MG tablet Take 1 tablet (25 mg total) by mouth daily. 90 tablet 3  . Tiotropium Bromide Monohydrate (SPIRIVA RESPIMAT) 2.5 MCG/ACT AERS Inhale 2 puffs into the lungs daily. 4 g 3   No current facility-administered medications for this visit.     Past Medical History:  Diagnosis Date  . Asthma   . Cancer ([place])   . CHF (congestive heart failure) ([place])    a. EF 45-50% by echo in 07/2017 b. EF reduced to 20-25% by repeat echo in 10/2018  . Coronary artery disease    a. cath in 2016 showing mild nonobstructive  disease b. cath in 10/2018 showing nonobstructive CAD with 10% LM stenosis, 25% Proximal-LAD, 25% LCx, and mild pulmonary HTN  . Diabetes mellitus without complication ([place])   . Gout   . History of pulmonary embolus (PE) 2016  . Hypertension   . Lung cancer ([place])   . Pneumonia     ROS:   All systems reviewed and negative except as noted in the HPI.   Past Surgical History:  Procedure Laterality Date  . CERVICAL SPINE SURGERY    . RIGHT/LEFT HEART CATH AND CORONARY ANGIOGRAPHY N/A 11/08/2018   Procedure: RIGHT/LEFT HEART CATH AND CORONARY ANGIOGRAPHY;  Surgeon: Roger Booze, MD;  Location: [facility];  Service: Cardiovascular;  Laterality: N/A;     Family  History  Problem Relation Age of Onset  . CAD Brother      Social History   Socioeconomic History  . Marital status: Single    Spouse name: Not on file  . Number of children: Not on file  . Years of education: Not on file  . Highest education level: Not on file  Occupational History  . Not on file  Tobacco Use  . Smoking status: Former Smoker    Quit date: 09/05/2003    Years since quitting: 16.1  . Smokeless tobacco: Never Used  Substance and Sexual Activity  . Alcohol use: Not Currently  . Drug use: Not Currently  . Sexual activity: Not on file  Other Topics Concern  . Not on file  Social History Narrative  . Not on file   Social Determinants of Health   Financial Resource Strain:   . Difficulty of Paying Living Expenses: Not on file  Food Insecurity:   . Worried About Charity fundraiser in the Last Year: Not on file  . Ran Out of Food in the Last Year: Not on file  Transportation Needs:   . Lack of Transportation (Medical): Not on file  . Lack of Transportation (Non-Medical): Not on file  Physical Activity:   . Days of Exercise per Week: Not on file  . Minutes of Exercise per Session: Not on file  Stress:   . Feeling of Stress : Not on file  Social Connections:   . Frequency of Communication with Friends and Family: Not on file  . Frequency of Social Gatherings with Friends and Family: Not on file  . Attends Religious Services: Not on file  . Active Member of Clubs or Organizations: Not on file  . Attends Archivist Meetings: Not on file  . Marital Status: Not on file  Intimate Partner Violence:   . Fear of Current or Ex-Partner: Not on file  . Emotionally Abused: Not on file  . Physically Abused: Not on file  . Sexually Abused: Not on file     BP 132/81   Pulse (!) 104   Temp (!) 97.1 F (36.2 C)   Ht 6' (1.829 m)   Wt 221 lb 12.8 oz (100.6 kg)   BMI 30.08 kg/m   Physical Exam:  stable appearing 74 yo man, wearing oxygen and a mask,  NAD HEENT: Unremarkable Neck:  6 cm JVD, no thyromegally Lymphatics:  No adenopathy Back:  No CVA tenderness Lungs:  Clear with no wheezes HEART:  Regular rate rhythm, no murmurs, no rubs, no clicks Abd:  soft, positive bowel sounds, no organomegally, no rebound, no guarding Ext:  2 plus pulses, no edema, no cyanosis, no clubbing Skin:  No rashes no nodules  Neuro:  CN II through XII intact, motor grossly intact  EKG - reviewed, NSR with LBBB   Assess/Plan: 1. Chronic systolic heart failure - he has class 3 symptoms and LBBB despite maximal medical therapy. I have discussed the indications/risks/benefits/goals/expectations of biv ICD insertion and he wishes to proceed. 2. COPD - he has been on oxygen therapy for 6 years. He is stable.  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  Mikle Bosworth.D.

## 2019-10-27 ENCOUNTER — Encounter (HOSPITAL_COMMUNITY): Payer: Self-pay | Admitting: Physical Therapy

## 2019-10-27 ENCOUNTER — Other Ambulatory Visit: Payer: Self-pay

## 2019-10-27 ENCOUNTER — Ambulatory Visit (HOSPITAL_COMMUNITY): Payer: Medicare HMO | Admitting: Physical Therapy

## 2019-10-27 DIAGNOSIS — M542 Cervicalgia: Secondary | ICD-10-CM | POA: Diagnosis not present

## 2019-10-27 DIAGNOSIS — M6281 Muscle weakness (generalized): Secondary | ICD-10-CM | POA: Diagnosis not present

## 2019-10-27 DIAGNOSIS — R29898 Other symptoms and signs involving the musculoskeletal system: Secondary | ICD-10-CM | POA: Diagnosis not present

## 2019-10-27 DIAGNOSIS — R293 Abnormal posture: Secondary | ICD-10-CM | POA: Diagnosis not present

## 2019-10-27 NOTE — Therapy (Signed)
[place], [place], [address] Phone: [phone]   Fax:  [phone]  Physical Therapy Treatment/ Progress Note  Patient Details  Name: Roger Lowe. MRN: [id number] Date of Birth: 05-26-1946 Referring Provider (PT): Melina Schools MD   Encounter Date: 10/27/2019  Progress Note   Reporting Period 09/15/19 to 10/27/19   See note below for Objective Data and Assessment of Progress/Goals   PT End of Session - 10/27/19 1305    Visit Number  10    Number of Visits  20    Date for PT Re-Evaluation  11/24/19    Authorization Type  Primary: [employer] HMO Secondary: Medicaid of [place] (yes auth)    Authorization Time Period  09/15/19-10/27/19 progress note completed on 10th visit on 10/27/19; requesting additional 8 visits 10/27/19 - 11/24/19    Authorization - Visit Number  10    Authorization - Number of Visits  10    Progress Note Due on Visit  10    PT Start Time  1301    PT Stop Time  1345    PT Time Calculation (min)  44 min    Equipment Utilized During Treatment  Oxygen    Activity Tolerance  Patient tolerated treatment well    Behavior During Therapy  [facility] for tasks assessed/performed       Past Medical History:  Diagnosis Date  . Asthma   . Cancer ([place])   . CHF (congestive heart failure) ([place])    a. EF 45-50% by echo in 07/2017 b. EF reduced to 20-25% by repeat echo in 10/2018  . Coronary artery disease    a. cath in 2016 showing mild nonobstructive disease b. cath in 10/2018 showing nonobstructive CAD with 10% LM stenosis, 25% Proximal-LAD, 25% LCx, and mild pulmonary HTN  . Diabetes mellitus without complication ([place])   . Gout   . History of pulmonary embolus (PE) 2016  . Hypertension   . Lung cancer ([place])   . Pneumonia     Past Surgical History:  Procedure Laterality Date  . CERVICAL SPINE SURGERY    . RIGHT/LEFT HEART CATH AND CORONARY ANGIOGRAPHY N/A 11/08/2018   Procedure: RIGHT/LEFT HEART CATH  AND CORONARY ANGIOGRAPHY;  Surgeon: [name], MD;  Location: [facility] [place] [facility];  Service: Cardiovascular;  Laterality: N/A;    There were no vitals filed for this visit.  Subjective Assessment - 10/27/19 1301    Subjective  Patient states it's hard to tell if he has had improvement due to a lot of other issues going on. He has to have a defibrillator put in next month. He feels improvement in symptoms following prior sessions but only has relief for limited time. His MD does not want to do surgery. He is uncertain if he wants to pursue any surgical intervention. Patient reports 35-40% improvement in cervical symptoms. He states he is making progress but it has been slow. Pain in last several days has been 9/10, but currently 3/10. He still feels like he doesn't do his home exercises right. He still over does his chin tucks sometimes.    Pertinent History  C7-T1 Fusion in 1995, CHF, DM, lung cancer, COPD    Limitations  Other (comment)   lying down   How long can you stand comfortably?  unrestricted    How long can you walk comfortably?  3-4 blocks slowly    Patient Stated Goals  to decrease symptoms    Currently  in Pain?  Yes    Pain Score  3     Pain Location  Neck         [facility] PT Assessment - 10/27/19 0001      Assessment   Medical Diagnosis  Neck Pain with radiating pain    Referring Provider (PT)  Melina Schools MD    Onset Date/Surgical Date  10/15/18    Hand Dominance  Right      Precautions   Precautions  None      Restrictions   Weight Bearing Restrictions  No      Balance Screen   Has the patient fallen in the past 6 months  No    Has the patient had a decrease in activity level because of a fear of falling?   No    Is the patient reluctant to leave their home because of a fear of falling?   No      Home Film/video editor residence    Living Arrangements  Other relatives    Available Help at Discharge  Family      Prior  Function   Level of Independence  Independent    Vocation  Retired      Associate Professor   Overall Cognitive Status  Within Functional Limits for tasks assessed    Attention  Focused      Observation/Other Assessments   Observations  Patien sits with slouched posture, forward head, rounded shoulders, ambulates without AD uses O2 tank       Sensation   Light Touch  Appears Intact      Posture/Postural Control   Posture/Postural Control  Postural limitations    Postural Limitations  Forward head;Rounded Shoulders      AROM   Cervical Flexion  10% limited    Cervical Extension  25% limited pain in lower part of neck    Cervical - Right Side Bend  75% limited     Cervical - Left Side Bend  75% limited *pain in neck and L shoulder    Cervical - Right Rotation  50% limited    Cervical - Left Rotation  25% limited      Strength   Overall Strength Comments  decreased on L side    Right Shoulder Flexion  4+/5    Right Shoulder ABduction  4+/5    Left Shoulder Flexion  4/5    Left Shoulder ABduction  4-/5    Right Elbow Flexion  5/5    Right Elbow Extension  5/5    Left Elbow Flexion  5/5    Left Elbow Extension  5/5      Palpation   Spinal mobility  hypomobile cervical spine    Palpation comment  tenderness to palpation bilaterally in suboccipitals, cervical paraspinals and SCM                   [facility] Adult PT Treatment/Exercise - 10/27/19 0001      Neck Exercises: Supine   Other Supine Exercise  cervical retraction in supine 2x10      Manual Therapy   Manual Therapy  Joint mobilization;Passive ROM;Manual Traction    Manual therapy comments  all manual interventions performed independently of other interventions    Joint Mobilization  medial glides and PAs to C2-6 bilaterall grade II-III for pain    Soft tissue mobilization  STM to bilateral cervical paraspinals, SCM, suboccipitals    Passive ROM  manual cervical retraction in  supine 2x10 with OP    Manual Traction   cerivcal traction - performed by PT - continuous, x5 30-60 seconds holds - felt good             PT Education - 10/27/19 1305    Education Details  Patient educated on HEP, proper frequency, exam findings, progress made, and positioning of exercises.    Person(s) Educated  Patient    Methods  Explanation    Comprehension  Verbalized understanding       PT Short Term Goals - 10/27/19 1319      PT SHORT TERM GOAL #1   Title  Patient will be independent with HEP in order to improve functional outcomes.    Time  3    Period  Weeks    Status  On-going    Target Date  10/06/19      PT SHORT TERM GOAL #2   Title  Patient will report 25% improvment in symptoms for improved quality of life.    Time  3    Period  Weeks    Status  Achieved    Target Date  10/06/19        PT Long Term Goals - 09/21/19 1213      PT LONG TERM GOAL #1   Title  Patient will report at least 75% improvement in symptoms in order to improve quality of life.    Time  6    Period  Weeks    Status  On-going    Target Date  10/27/19      PT LONG TERM GOAL #2   Title  Patient will improve foto score by at least 7 points in order to demonstrate improved quality of life.    Baseline  52% limited    Time  6    Period  Weeks    Status  On-going    Target Date  10/27/19      PT LONG TERM GOAL #3   Title  Patient will improve cervical ROM by at least 25% in all planes in order to be able to look around while riding in the car.    Time  6    Period  Weeks    Status  On-going    Target Date  10/27/19      PT LONG TERM GOAL #4   Title  Patient will report centralized cervical symptoms no greater than 2/10 for improved ability to complete all ADL.    Time  6    Period  Weeks    Status  On-going    Target Date  10/27/19            Plan - 10/27/19 1306    Clinical Impression Statement  Patient has met 1/2  short term goals with improving symptoms but continues to have difficulty completing HEP.  He has met 0/4 long term goals at this time due to limited ROM, slow improvement of symptoms, impaired functional mobility, and pain. Patient is making slow progress at this time likely due to this being a chronic condition and multiple co-morbidities. He also as difficulty completing HEP with proper mechanics due to impaired motor control and need for frequent cueing. Patient continues to feel reduction in symptoms following manual therapy. He has improved ROM in transverse and sagittal plane movements as well as LUE strength. Patient will continue to benefit from skilled physical therapy in order to improve function and reduce impairment.    Personal Factors  and Comorbidities  Age;Behavior Pattern;Comorbidity 3+;Time since onset of injury/illness/exacerbation;Past/Current Experience    Comorbidities  C5-C6 per MRN and patient report (C7-T1 per referral) Fusion in 1995, CHF, DM, lung cancer, COPD    Examination-Activity Limitations  Bed Mobility;Bend;Carry;Lift;Locomotion Level;Sit;Sleep    Examination-Participation Restrictions  Church;Cleaning;Driving;Laundry;Meal Prep;Shop;Volunteer;Yard Work    Merchant navy officer  --    Rehab Potential  Fair    PT Frequency  2x / week    PT Duration  4 weeks    PT Treatment/Interventions  ADLs/Self Care Home Management;Canalith Repostioning;Electrical Stimulation;Cryotherapy;Moist Heat;Traction;Ultrasound;Functional mobility training;Therapeutic activities;Therapeutic exercise;Neuromuscular re-education;Patient/family education;Manual techniques;Passive range of motion;Dry needling;Spinal Manipulations;Joint Manipulations;Energy conservation;Taping;Vestibular    PT Next Visit Plan  continue with cerivcal mobilization, traction, STM and active cerivcal ROM. Investigate/improve thoracic mobility as able; possibly add SNAGs with rotation, UT stretch, thoracic mobility, postural strengthening as able    PT Home Exercise Plan  cerivcal SNAGs into  extension(discontinued for now) 09/22/19: cervical retraction in supine 2x10; cervical excursion exercises. (discontinued for now)    Consulted and Agree with Plan of Care  Patient       Patient will benefit from skilled therapeutic intervention in order to improve the following deficits and impairments:  Decreased activity tolerance, Decreased endurance, Decreased mobility, Decreased range of motion, Hypomobility, Decreased strength, Increased muscle spasms, Impaired sensation, Impaired tone, Impaired UE functional use, Improper body mechanics, Postural dysfunction, Pain  Visit Diagnosis: Cervicalgia  Other symptoms and signs involving the musculoskeletal system  Abnormal posture  Muscle weakness (generalized)     Problem List Patient Active Problem List   Diagnosis Date Noted  . On continuous oral anticoagulation 05/10/2019  . Mediastinal adenopathy 05/10/2019  . Hilar adenopathy 05/10/2019  . H/O: lung cancer 05/10/2019  . Chronic hypoxemic respiratory failure ([place]) 05/10/2019  . Pleural effusion, bilateral 05/10/2019  . DNR (do not resuscitate) 05/10/2019  . Acute systolic heart failure ([place])   . Community acquired pneumonia of left lower lobe of lung 10/12/2018  . SIRS (systemic inflammatory response syndrome) ([place]) 10/12/2018  . Uncontrolled hypertension 10/12/2018  . Type 2 diabetes mellitus without complication, with long-term current use of insulin ([place]) 10/12/2018  . Chest pain 10/12/2018  . Prolonged QT interval 10/12/2018    3:12 PM, 10/27/19 Mearl Latin PT, DPT Physical Therapist at [facility] [place], [place], [address] Phone: [phone]   Fax:  [phone]  Name: Dantavious Snowball. MRN: [id number] Date of Birth: [birth year]

## 2019-10-28 DIAGNOSIS — M5412 Radiculopathy, cervical region: Secondary | ICD-10-CM | POA: Diagnosis not present

## 2019-10-30 ENCOUNTER — Ambulatory Visit: Payer: Medicare HMO | Attending: Internal Medicine

## 2019-10-30 DIAGNOSIS — Z23 Encounter for immunization: Secondary | ICD-10-CM | POA: Insufficient documentation

## 2019-10-30 NOTE — Progress Notes (Signed)
   [id number]  Name:  Roger Lowe.    MRN: [id number] DOB: September 01, 1946  10/30/2019  [name] was observed post Covid-19 immunization for 15 minutes without incidence. He was provided with Vaccine Information Sheet and instruction to access the V-Safe system.   [name] was instructed to call 911 with any severe reactions post vaccine: [name] Difficulty breathing  . Swelling of your face and throat  . A fast heartbeat  . A bad rash all over your body  . Dizziness and weakness    Immunizations Administered    Name Date Dose VIS Date Route   Pfizer COVID-19 Vaccine 10/30/2019  4:57 PM 0.3 mL 08/12/2019 Intramuscular   Manufacturer: [place]   Lot: [id number]   [place]: [id number]

## 2019-11-01 DIAGNOSIS — E1140 Type 2 diabetes mellitus with diabetic neuropathy, unspecified: Secondary | ICD-10-CM | POA: Diagnosis not present

## 2019-11-01 DIAGNOSIS — R269 Unspecified abnormalities of gait and mobility: Secondary | ICD-10-CM | POA: Diagnosis not present

## 2019-11-02 ENCOUNTER — Telehealth (HOSPITAL_COMMUNITY): Payer: Self-pay | Admitting: Physical Therapy

## 2019-11-02 ENCOUNTER — Ambulatory Visit (HOSPITAL_COMMUNITY): Payer: Medicare HMO | Admitting: Physical Therapy

## 2019-11-02 NOTE — Telephone Encounter (Signed)
pt called to cx today's appt due to he is not feeling well

## 2019-11-04 ENCOUNTER — Ambulatory Visit (HOSPITAL_COMMUNITY): Payer: Medicare HMO

## 2019-11-04 ENCOUNTER — Other Ambulatory Visit: Payer: Self-pay

## 2019-11-04 ENCOUNTER — Telehealth (HOSPITAL_COMMUNITY): Payer: Self-pay

## 2019-11-04 ENCOUNTER — Other Ambulatory Visit (HOSPITAL_COMMUNITY)
Admission: RE | Admit: 2019-11-04 | Discharge: 2019-11-04 | Disposition: A | Discharge status: Home / Self Care | Payer: Medicare HMO | Source: Ambulatory Visit | Attending: Internal Medicine | Admitting: Internal Medicine

## 2019-11-04 DIAGNOSIS — Z20822 Contact with and (suspected) exposure to covid-19: Secondary | ICD-10-CM | POA: Diagnosis not present

## 2019-11-04 DIAGNOSIS — M5412 Radiculopathy, cervical region: Secondary | ICD-10-CM | POA: Diagnosis not present

## 2019-11-04 DIAGNOSIS — Z01812 Encounter for preprocedural laboratory examination: Secondary | ICD-10-CM | POA: Diagnosis not present

## 2019-11-04 NOTE — Telephone Encounter (Signed)
pt called to cx today's appt due to he had to get a shot in his neck.

## 2019-11-05 LAB — SARS CORONAVIRUS 2 (TAT 6-24 HRS): SARS Coronavirus 2: NEGATIVE

## 2019-11-07 ENCOUNTER — Ambulatory Visit (HOSPITAL_COMMUNITY)
Admission: RE | Admit: 2019-11-07 | Discharge: 2019-11-07 | Disposition: A | Discharge status: Home / Self Care | Payer: Medicare HMO | Attending: Internal Medicine | Admitting: Internal Medicine

## 2019-11-07 ENCOUNTER — Other Ambulatory Visit: Payer: Self-pay

## 2019-11-07 ENCOUNTER — Ambulatory Visit (HOSPITAL_COMMUNITY): Payer: Medicare HMO

## 2019-11-07 ENCOUNTER — Encounter (HOSPITAL_COMMUNITY)
Admission: RE | Disposition: A | Discharge status: Home / Self Care | Payer: Medicare HMO | Source: Home / Self Care | Attending: Internal Medicine

## 2019-11-07 DIAGNOSIS — I110 Hypertensive heart disease with heart failure: Secondary | ICD-10-CM | POA: Diagnosis not present

## 2019-11-07 DIAGNOSIS — Z87891 Personal history of nicotine dependence: Secondary | ICD-10-CM | POA: Insufficient documentation

## 2019-11-07 DIAGNOSIS — Z794 Long term (current) use of insulin: Secondary | ICD-10-CM | POA: Insufficient documentation

## 2019-11-07 DIAGNOSIS — Z9981 Dependence on supplemental oxygen: Secondary | ICD-10-CM | POA: Diagnosis not present

## 2019-11-07 DIAGNOSIS — Z79899 Other long term (current) drug therapy: Secondary | ICD-10-CM | POA: Insufficient documentation

## 2019-11-07 DIAGNOSIS — I428 Other cardiomyopathies: Secondary | ICD-10-CM | POA: Insufficient documentation

## 2019-11-07 DIAGNOSIS — J449 Chronic obstructive pulmonary disease, unspecified: Secondary | ICD-10-CM | POA: Insufficient documentation

## 2019-11-07 DIAGNOSIS — Z8249 Family history of ischemic heart disease and other diseases of the circulatory system: Secondary | ICD-10-CM | POA: Diagnosis not present

## 2019-11-07 DIAGNOSIS — M109 Gout, unspecified: Secondary | ICD-10-CM | POA: Insufficient documentation

## 2019-11-07 DIAGNOSIS — I447 Left bundle-branch block, unspecified: Secondary | ICD-10-CM | POA: Insufficient documentation

## 2019-11-07 DIAGNOSIS — Z7901 Long term (current) use of anticoagulants: Secondary | ICD-10-CM | POA: Diagnosis not present

## 2019-11-07 DIAGNOSIS — I2510 Atherosclerotic heart disease of native coronary artery without angina pectoris: Secondary | ICD-10-CM | POA: Insufficient documentation

## 2019-11-07 DIAGNOSIS — Z86711 Personal history of pulmonary embolism: Secondary | ICD-10-CM | POA: Insufficient documentation

## 2019-11-07 DIAGNOSIS — I5022 Chronic systolic (congestive) heart failure: Secondary | ICD-10-CM | POA: Diagnosis not present

## 2019-11-07 DIAGNOSIS — I272 Pulmonary hypertension, unspecified: Secondary | ICD-10-CM | POA: Insufficient documentation

## 2019-11-07 DIAGNOSIS — Z7951 Long term (current) use of inhaled steroids: Secondary | ICD-10-CM | POA: Insufficient documentation

## 2019-11-07 DIAGNOSIS — Z006 Encounter for examination for normal comparison and control in clinical research program: Secondary | ICD-10-CM | POA: Insufficient documentation

## 2019-11-07 DIAGNOSIS — I499 Cardiac arrhythmia, unspecified: Secondary | ICD-10-CM | POA: Diagnosis not present

## 2019-11-07 DIAGNOSIS — E119 Type 2 diabetes mellitus without complications: Secondary | ICD-10-CM | POA: Insufficient documentation

## 2019-11-07 DIAGNOSIS — Z95810 Presence of automatic (implantable) cardiac defibrillator: Secondary | ICD-10-CM

## 2019-11-07 DIAGNOSIS — Z950 Presence of cardiac pacemaker: Secondary | ICD-10-CM | POA: Diagnosis not present

## 2019-11-07 DIAGNOSIS — J9811 Atelectasis: Secondary | ICD-10-CM | POA: Diagnosis not present

## 2019-11-07 HISTORY — PX: BIV ICD INSERTION CRT-D: EP1195

## 2019-11-07 LAB — BASIC METABOLIC PANEL WITH GFR
Anion gap: 8 (ref 5–15)
BUN: 13 mg/dL (ref 8–23)
CO2: 26 mmol/L (ref 22–32)
Calcium: 9 mg/dL (ref 8.9–10.3)
Chloride: 103 mmol/L (ref 98–111)
Creatinine, Ser: 1.22 mg/dL (ref 0.61–1.24)
GFR calc Af Amer: >60 mL/min
GFR calc non Af Amer: 59 mL/min — ABNORMAL LOW
Glucose, Bld: 124 mg/dL — ABNORMAL HIGH (ref 70–99)
Potassium: 5.8 mmol/L — ABNORMAL HIGH (ref 3.5–5.1)
Sodium: 137 mmol/L (ref 135–145)

## 2019-11-07 LAB — POCT I-STAT, CHEM 8
BUN: 10 mg/dL (ref 8–23)
Calcium, Ion: 1.15 mmol/L (ref 1.15–1.40)
Chloride: 103 mmol/L (ref 98–111)
Creatinine, Ser: 1 mg/dL (ref 0.61–1.24)
Glucose, Bld: 128 mg/dL — ABNORMAL HIGH (ref 70–99)
HCT: 40 % (ref 39.0–52.0)
Hemoglobin: 13.6 g/dL (ref 13.0–17.0)
Potassium: 3.7 mmol/L (ref 3.5–5.1)
Sodium: 142 mmol/L (ref 135–145)
TCO2: 27 mmol/L (ref 22–32)

## 2019-11-07 LAB — GLUCOSE, CAPILLARY
Glucose-Capillary: 111 mg/dL — ABNORMAL HIGH (ref 70–99)
Glucose-Capillary: 142 mg/dL — ABNORMAL HIGH (ref 70–99)

## 2019-11-07 LAB — CBC
HCT: 45 % (ref 39.0–52.0)
Hemoglobin: 14.3 g/dL (ref 13.0–17.0)
MCH: 29.7 pg (ref 26.0–34.0)
MCHC: 31.8 g/dL (ref 30.0–36.0)
MCV: 93.4 fL (ref 80.0–100.0)
Platelets: 291 10*3/uL (ref 150–400)
RBC: 4.82 MIL/uL (ref 4.22–5.81)
RDW: 13.4 % (ref 11.5–15.5)
WBC: 9.6 10*3/uL (ref 4.0–10.5)
nRBC: 0 % (ref 0.0–0.2)

## 2019-11-07 IMAGING — CR DG CHEST 2V
2 series · 2 of 2 positions shown · non-contrast
Comparison: [DATE].

CLINICAL DATA: Cardiac arrhythmia with pacemaker placement

EXAM:
CHEST - 2 VIEW

[w chest pa]
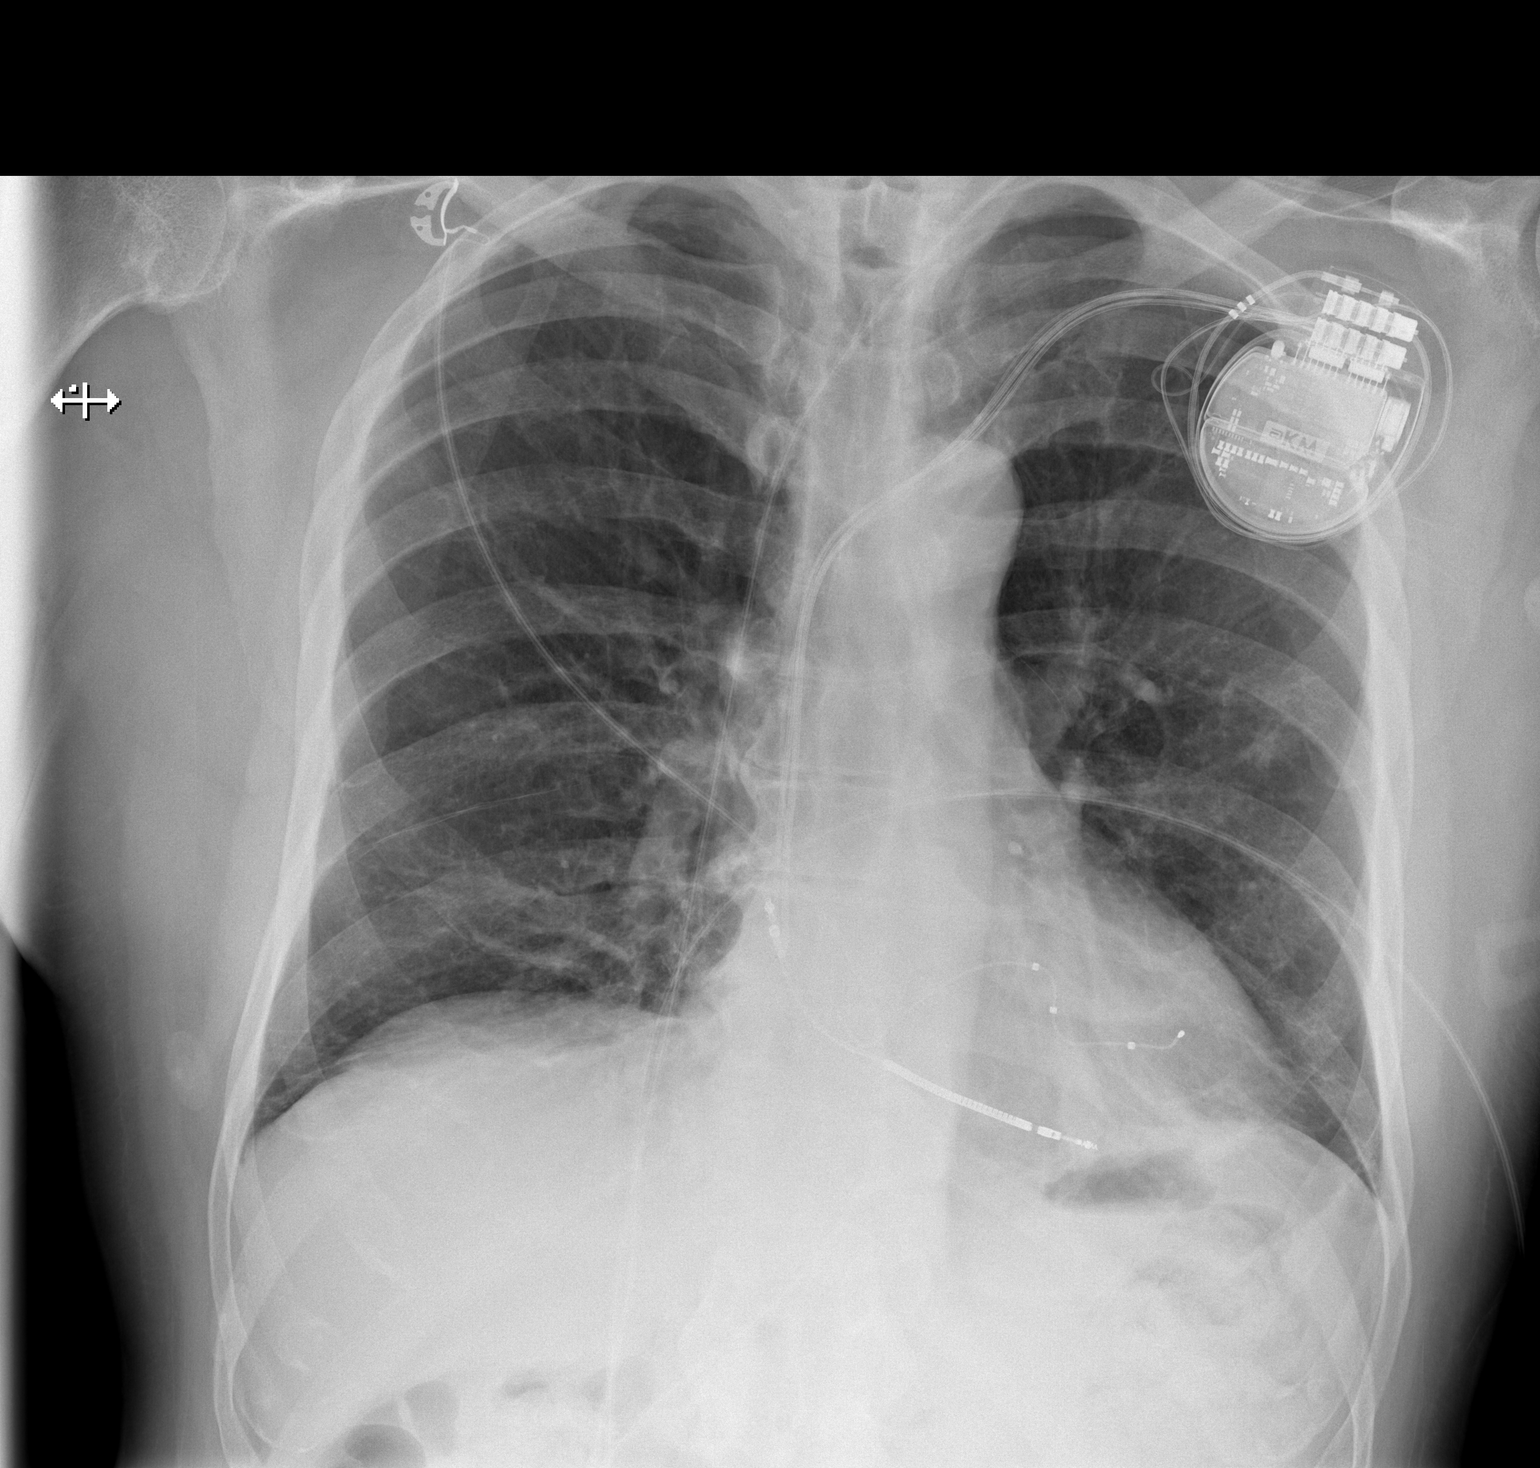

[w chest lat]
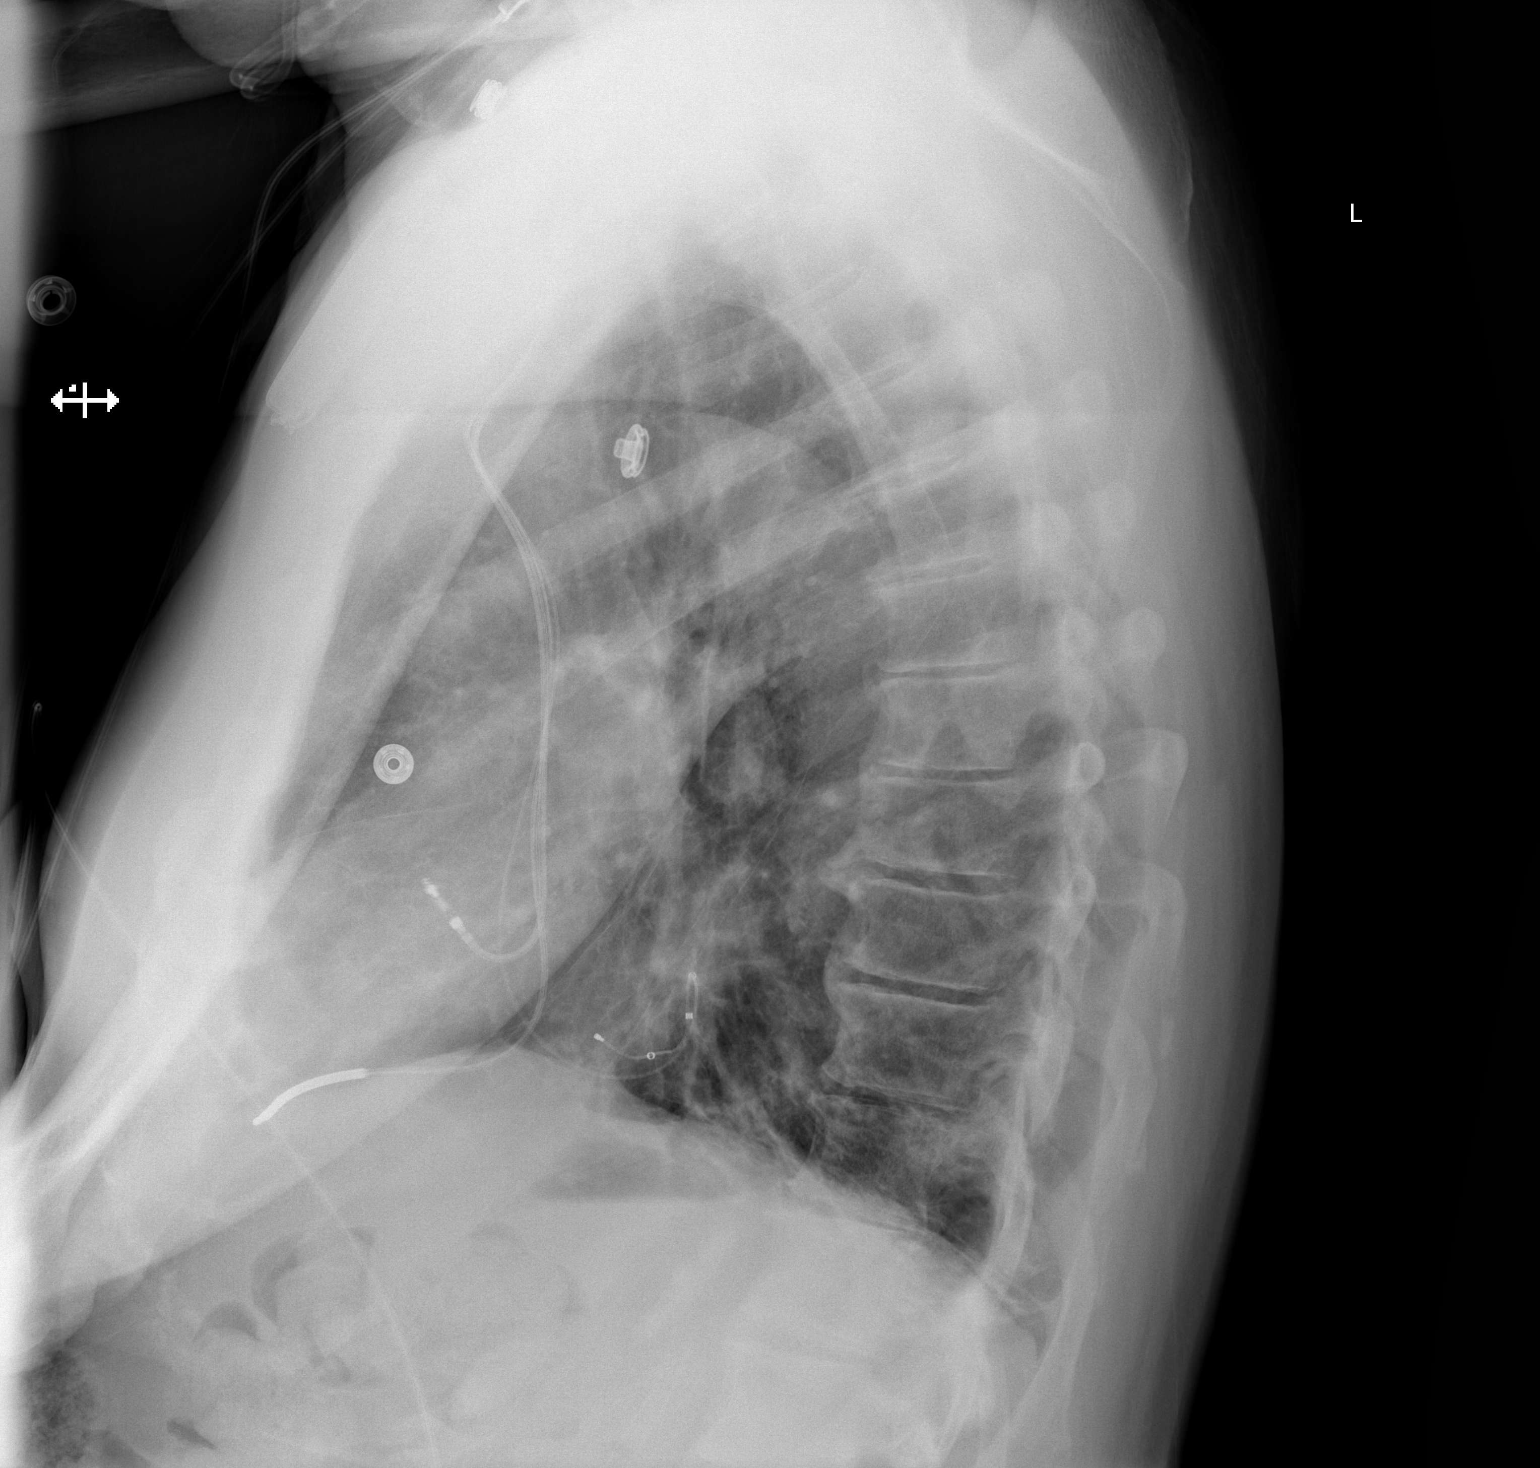

[2 of 2 positions shown; findings below may reference images not displayed]

FINDINGS: There is a pacemaker device on the left. Lead tips are attached to
the right atrium, right ventricle, and coronary sinus. No
pneumothorax. There is bibasilar atelectatic change. No edema or
airspace opacity. The heart size and pulmonary vascularity are
normal. No adenopathy. There are foci of degenerative change in the
thoracic spine.
IMPRESSION: Pacemaker lead tips attached to right atrium, right ventricle, and
coronary sinus. No pneumothorax. Bibasilar atelectasis. No edema or
airspace opacity. Heart size normal.

## 2019-11-07 SURGERY — BIV ICD INSERTION CRT-D

## 2019-11-07 MED ORDER — LIDOCAINE HCL (PF) 1 % IJ SOLN
INTRAMUSCULAR | Status: AC
  Filled 2019-11-07: qty 60

## 2019-11-07 MED ORDER — FENTANYL CITRATE (PF) 100 MCG/2ML IJ SOLN
INTRAMUSCULAR | Status: DC | PRN
  Administered 2019-11-07 (×6): 12.5 ug via INTRAVENOUS

## 2019-11-07 MED ORDER — HEPARIN (PORCINE) IN NACL 1000-0.9 UT/500ML-% IV SOLN
INTRAVENOUS | Status: DC | PRN
  Administered 2019-11-07: 500 mL

## 2019-11-07 MED ORDER — FENTANYL CITRATE (PF) 100 MCG/2ML IJ SOLN
INTRAMUSCULAR | Status: AC
  Filled 2019-11-07: qty 2

## 2019-11-07 MED ORDER — ONDANSETRON HCL 4 MG/2ML IJ SOLN: 4.0000 mg | Freq: Four times a day (QID) | INTRAMUSCULAR | Status: DC | PRN

## 2019-11-07 MED ORDER — MIDAZOLAM HCL 5 MG/5ML IJ SOLN
INTRAMUSCULAR | Status: DC | PRN
  Administered 2019-11-07 (×6): 1 mg via INTRAVENOUS

## 2019-11-07 MED ORDER — SODIUM CHLORIDE 0.9 % IV SOLN: INTRAVENOUS | Status: DC

## 2019-11-07 MED ORDER — SODIUM CHLORIDE 0.9 % IV SOLN
INTRAVENOUS | Status: AC
  Filled 2019-11-07: qty 2

## 2019-11-07 MED ORDER — IOHEXOL 350 MG/ML SOLN
INTRAVENOUS | Status: DC | PRN
  Administered 2019-11-07: 5 mL

## 2019-11-07 MED ORDER — LIDOCAINE HCL (PF) 1 % IJ SOLN
INTRAMUSCULAR | Status: DC | PRN
  Administered 2019-11-07: 60 mL

## 2019-11-07 MED ORDER — ACETAMINOPHEN 325 MG PO TABS: 325.0000 mg | ORAL_TABLET | ORAL | Status: DC | PRN

## 2019-11-07 MED ORDER — CEFAZOLIN SODIUM-DEXTROSE 2-4 GM/100ML-% IV SOLN
2.0000 g | INTRAVENOUS | Status: AC
  Administered 2019-11-07: 2 g via INTRAVENOUS

## 2019-11-07 MED ORDER — MIDAZOLAM HCL 5 MG/5ML IJ SOLN
INTRAMUSCULAR | Status: AC
  Filled 2019-11-07: qty 5

## 2019-11-07 MED ORDER — HEPARIN (PORCINE) IN NACL 1000-0.9 UT/500ML-% IV SOLN
INTRAVENOUS | Status: AC
  Filled 2019-11-07: qty 500

## 2019-11-07 MED ORDER — CEFAZOLIN SODIUM-DEXTROSE 2-4 GM/100ML-% IV SOLN
INTRAVENOUS | Status: AC
  Filled 2019-11-07: qty 100

## 2019-11-07 MED ORDER — SODIUM CHLORIDE 0.9 % IV SOLN
80.0000 mg | INTRAVENOUS | Status: AC
  Administered 2019-11-07: 12:00:00 80 mg

## 2019-11-07 MED ORDER — CHLORHEXIDINE GLUCONATE 4 % EX LIQD: 4.0000 "application " | Freq: Once | CUTANEOUS | Status: DC

## 2019-11-07 MED ORDER — CEFAZOLIN SODIUM-DEXTROSE 1-4 GM/50ML-% IV SOLN: 1.0000 g | Freq: Once | INTRAVENOUS | Status: DC

## 2019-11-07 SURGICAL SUPPLY — 16 items
CABLE SURGICAL S-101-97-12 (CABLE) ×3 IMPLANT
CATH CPS DIRECT 135 DS2C020 (CATHETERS) ×3 IMPLANT
CATH HEX JOS 2-5-2 65CM 6F REP (CATHETERS) ×3 IMPLANT
CPS IMPLANT KIT 410190 (MISCELLANEOUS) ×3 IMPLANT
ICD GALLANT HFCRTD CDHFA500Q (ICD Generator) ×3 IMPLANT
KIT ESSENTIALS PG (KITS) ×3 IMPLANT
LEAD DURATA 7122Q-65CM (Lead) ×3 IMPLANT
LEAD QUARTET 1458QL-86 (Lead) ×1 IMPLANT
LEAD TENDRIL MRI 52CM LPA1200M (Lead) ×3 IMPLANT
PAD PRO RADIOLUCENT 2001M-C (PAD) ×3 IMPLANT
QUARTET 1458QL-86 (Lead) ×3 IMPLANT
SHEATH 7FR PRELUDE SNAP 13 (SHEATH) ×3 IMPLANT
SHEATH 8FR PRELUDE SNAP 13 (SHEATH) ×3 IMPLANT
TRAY PACEMAKER INSERTION (PACKS) ×3 IMPLANT
WIRE ACUITY WHISPER EDS 4648 (WIRE) ×3 IMPLANT
WIRE MAILMAN 182CM (WIRE) ×3 IMPLANT

## 2019-11-07 NOTE — Progress Notes (Signed)
Spoke with Sonia Side pt brother reviewed discharge instructions. Voices understanding.Reviewed with pt voices understanding.

## 2019-11-07 NOTE — Progress Notes (Signed)
Oda Kilts, PA called about when to restart Xarelto. Jonni Sanger states not to delay pt discharge. Jonni Sanger will call the pt tomorrow and let him know when to restart Xarelto. Pt informed.

## 2019-11-07 NOTE — Interval H&P Note (Signed)
History and Physical Interval Note:  11/07/2019 9:40 AM  Roger Lowe.  has presented today for surgery, with the diagnosis of heart failure.  The various methods of treatment have been discussed with the patient and family. After consideration of risks, benefits and other options for treatment, the patient has consented to  Procedure(s): BIV ICD INSERTION CRT-D (N/A) as a surgical intervention.  The patient's history has been reviewed, patient examined, no change in status, stable for surgery.  I have reviewed the patient's chart and labs.  Questions were answered to the patient's satisfaction.     Cristopher Peru

## 2019-11-07 NOTE — Progress Notes (Signed)
Attempted to call pt brother. Message left for him to call us back so we can go over d/c instructions.

## 2019-11-07 NOTE — Discharge Instructions (Signed)
After Your ICD (Implantable Cardiac Defibrillator)    You have a  ICD  ACTIVITY  Do not lift your arm above shoulder height for 1 week after your procedure. After 7 days, you may progress as below.     Monday November 14, 2019  Tuesday November 15, 2019 Wednesday November 16, 2019 Thursday November 17, 2019    Do not lift, push, pull, or carry anything over 10 pounds with the affected arm until 6 weeks (Monday December 19, 2019 ) after your procedure.    Do NOT DRIVE until you have been seen for your wound check, or as long as instructed by your healthcare provider.    Ask your healthcare provider when you can go back to work   INCISION/Dressing  If you are on a blood thinner such as Coumadin, Xarelto, Eliquis, Plavix, or Pradaxa please confirm with your provider when this should be resumed. -----   Monitor your defibrillator site for redness, swelling, and drainage. Call the device clinic at [phone] if you experience these symptoms or fever/chills.   If your incision is sealed with Steri-strips or staples, you may shower 10 days after your procedure or when told by your provider. Do not remove the steri-strips or let the shower hit directly on your site. You may wash around your site with soap and water.     Avoid lotions, ointments, or perfumes over your incision until it is well-healed.   You may use a hot tub or a pool AFTER your wound check appointment if the incision is completely closed.   Your ICD is designed to protect you from life threatening heart rhythms. Because of this, you may receive a shock.   o 1 shock with no symptoms:  Call the office during business hours. o 1 shock with symptoms (chest pain, chest pressure, dizziness, lightheadedness, shortness of breath, overall feeling unwell):  Call 911. o If you experience 2 or more shocks in 24 hours:  Call 911. o If you receive a shock, you should not drive for 6 months per the [place] DMV IF you receive  appropriate therapy from your ICD.    ICD Alerts:  Some alerts are vibratory and others beep. These are NOT emergencies. Please call our office to let us know. If this occurs at night or on weekends, it can wait until the next business day. Send a remote transmission.   If your device is capable of reading fluid status (for heart failure), you will be offered monthly monitoring to review this with you.   DEVICE MANAGEMENT  Remote monitoring is used to monitor your ICD from home. This monitoring is scheduled every 91 days by our office. It allows [place] to keep an eye on the functioning of your device to ensure it is working properly. You will routinely see your Electrophysiologist annually (more often if necessary).    You should receive your ID card for your new device in 4-8 weeks. Keep this card with you at all times once received. Consider wearing a medical alert bracelet or necklace.   Your ICD  may be MRI compatible. This will be discussed at your next office visit/wound check.  You should avoid contact with strong electric or magnetic fields.    Do not use amateur (ham) radio equipment or electric (arc) welding torches. MP3 player headphones with magnets should not be used. Some devices are safe to use if held at least 12 inches (30 cm)  from your defibrillator. These include power tools, lawn mowers, and speakers. If you are unsure if something is safe to use, ask your health care provider.   When using your cell phone, hold it to the ear that is on the opposite side from the defibrillator. Do not leave your cell phone in a pocket over the defibrillator.   You may safely use electric blankets, heating pads, computers, and microwave ovens.  Call the office right away if:  You have chest pain.  You feel more than one shock.  You feel more short of breath than you have felt before.  You feel more light-headed than you have felt before.  Your incision starts to open up.  This  information is not intended to replace advice given to you by your health care provider. Make sure you discuss any questions you have with your health care provider.     Cardioverter Defibrillator Implantation  An implantable cardioverter defibrillator (ICD) is a small device that is placed under the skin in the chest or abdomen. An ICD consists of a battery, a small computer (pulse generator), and wires (leads) that go into the heart. An ICD is used to detect and correct two types of dangerous irregular heartbeats (arrhythmias):  A rapid heart rhythm (tachycardia).  An arrhythmia in which the lower chambers of the heart (ventricles) contract in an uncoordinated way (fibrillation). When an ICD detects tachycardia, it sends a low-energy shock to the heart to restore the heartbeat to normal (cardioversion). This signal is usually painless. If cardioversion does not work or if the ICD detects fibrillation, it delivers a high-energy shock to the heart (defibrillation) to restart the heart. This shock may feel like a strong jolt in the chest. Your health care provider may prescribe an ICD if:  You have had an arrhythmia that originated in the ventricles.  Your heart has been damaged by a disease or heart condition. Sometimes, ICDs are programmed to act as a device called a pacemaker. Pacemakers can be used to treat a slow heartbeat (bradycardia) or tachycardia by taking over the heart rate with electrical impulses. Tell a health care provider about:  Any allergies you have.  All medicines you are taking, including vitamins, herbs, eye drops, creams, and over-the-counter medicines.  Any problems you or family members have had with anesthetic medicines.  Any blood disorders you have.  Any surgeries you have had.  Any medical conditions you have.  Whether you are pregnant or may be pregnant. What are the risks? Generally, this is a safe procedure. However, problems may occur,  including:  Swelling, bleeding, or bruising.  Infection.  Blood clots.  Damage to other structures or organs, such as nerves, blood vessels, or the heart.  Allergic reactions to medicines used during the procedure. What happens before the procedure? Staying hydrated Follow instructions from your health care provider about hydration, which may include:  Up to 2 hours before the procedure - you may continue to drink clear liquids, such as water, clear fruit juice, black coffee, and plain tea. Eating and drinking restrictions Follow instructions from your health care provider about eating and drinking, which may include:  8 hours before the procedure - stop eating heavy meals or foods such as meat, fried foods, or fatty foods.  6 hours before the procedure - stop eating light meals or foods, such as toast or cereal.  6 hours before the procedure - stop drinking milk or drinks that contain milk.  2 hours before  the procedure - stop drinking clear liquids. Medicine Ask your health care provider about:  Changing or stopping your normal medicines. This is important if you take diabetes medicines or blood thinners.  Taking medicines such as aspirin and ibuprofen. These medicines can thin your blood. Do not take these medicines before your procedure if your doctor tells you not to. Tests  You may have blood tests.  You may have a test to check the electrical signals in your heart (electrocardiogram, ECG).  You may have imaging tests, such as a chest X-ray. General instructions  For 24 hours before the procedure, stop using products that contain nicotine or tobacco, such as cigarettes and e-cigarettes. If you need help quitting, ask your health care provider.  Plan to have someone take you home from the hospital or clinic.  You may be asked to shower with a germ-killing soap. What happens during the procedure?  To reduce your risk of infection: ? Your health care team will wash  or sanitize their hands. ? Your skin will be washed with soap. ? Hair may be removed from the surgical area.  Small monitors will be put on your body. They will be used to check your heart, blood pressure, and oxygen level.  An IV tube will be inserted into one of your veins.  You will be given one or more of the following: ? A medicine to help you relax (sedative). ? A medicine to numb the area (local anesthetic). ? A medicine to make you fall asleep (general anesthetic).  Leads will be guided through a blood vessel into your heart and attached to your heart muscles. Depending on the ICD, the leads may go into one ventricle or they may go into both ventricles and into an upper chamber of the heart. An X-ray machine (fluoroscope) will be usedto help guide the leads.  A small incision will be made to create a deep pocket under your skin.  The pulse generator will be placed into the pocket.  The ICD will be tested.  The incision will be closed with stitches (sutures), skin glue, or staples.  A bandage (dressing) will be placed over the incision. This procedure may vary among health care providers and hospitals. What happens after the procedure?  Your blood pressure, heart rate, breathing rate, and blood oxygen level will be monitored often until the medicines you were given have worn off.  A chest X-ray will be taken to check that the ICD is in the right place.  You will need to stay in the hospital for 1-2 days so your health care provider can make sure your ICD is working.  Do not drive for 24 hours if you received a sedative. Ask your health care provider when it is safe for you to drive.  You may be given an identification card explaining that you have an ICD. Summary  An implantable cardioverter defibrillator (ICD) is a small device that is placed under the skin in the chest or abdomen. It is used to detect and correct dangerous irregular heartbeats (arrhythmias).  An ICD  consists of a battery, a small computer (pulse generator), and wires (leads) that go into the heart.  When an ICD detects rapid heart rhythm (tachycardia), it sends a low-energy shock to the heart to restore the heartbeat to normal (cardioversion). If cardioversion does not work or if the ICD detects uncoordinated heart contractions (fibrillation), it delivers a high-energy shock to the heart (defibrillation) to restart the heart.  You  will need to stay in the hospital for 1-2 days to make sure your ICD is working. This information is not intended to replace advice given to you by your health care provider. Make sure you discuss any questions you have with your health care provider. Document Revised: 07/31/2017 Document Reviewed: 08/27/2016 Elsevier Patient Education  2020 [employer].

## 2019-11-07 NOTE — Progress Notes (Signed)
Attempted to call pt brother on his cell and home number with no answer.

## 2019-11-07 NOTE — Progress Notes (Signed)
Call place to Dr Lovena Le message left to return call.

## 2019-11-08 ENCOUNTER — Telehealth (HOSPITAL_COMMUNITY): Payer: Self-pay

## 2019-11-08 ENCOUNTER — Telehealth: Payer: Self-pay | Admitting: Student

## 2019-11-08 ENCOUNTER — Ambulatory Visit (HOSPITAL_COMMUNITY): Payer: Medicare HMO | Attending: Orthopedic Surgery

## 2019-11-08 ENCOUNTER — Telehealth: Payer: Self-pay | Admitting: *Deleted

## 2019-11-08 NOTE — Telephone Encounter (Signed)
-----   Message from Shirley Friar, PA-C sent at 11/07/2019  3:37 PM EST -----  Same day discharge!  Please watch for next day transmission.   Thank you!  Jonni Sanger

## 2019-11-08 NOTE — Telephone Encounter (Signed)
No show.  Called and spoke to pt who reports receiving an ICD implant yesterday.  Pt plans to call MD to ask when appropriate to return to therapy.  Did cancel this Thursday's apt and will call back with date to return.  Ihor Austin, LPTA/CLT; Delana Meyer [phone]

## 2019-11-08 NOTE — Telephone Encounter (Signed)
Spoke with patient regarding next-day f/u transmission. Transmission received. Normal CRT-D function, no alerts. No arrhythmias. BiVP 84%. Presenting rhythm AS/BiVP 117bpm. Pt asymptomatic, reports he had to walk across his house to get to the monitor. Discharge instructions reviewed with pt. Aware of wound check on 11/17/19. Pt denies any questions or concerns at this time.

## 2019-11-08 NOTE — Telephone Encounter (Signed)
Discussed with patient.  He is aware to restart his Xarelto, Thursday evening, 11/10/2019 per Dr. Lovena Le.    Legrand Como [address]" [place], PA-C  11/08/2019 8:49 AM

## 2019-11-09 NOTE — Telephone Encounter (Signed)
Attempted to reach pt to advise that additional manual transmissions from ICD monitoring app are not necessary at this time (just needed for next-day check). LMOVM requesting call back to DC, direct number provided. No answer at home number, did not leave additional message.

## 2019-11-10 ENCOUNTER — Ambulatory Visit (HOSPITAL_COMMUNITY): Payer: Medicare HMO | Admitting: Physical Therapy

## 2019-11-11 ENCOUNTER — Telehealth (HOSPITAL_COMMUNITY): Payer: Self-pay | Admitting: Physical Therapy

## 2019-11-11 NOTE — Telephone Encounter (Signed)
pt had surgery 2 days ago and can not do anything until he sees his MD on the 18th

## 2019-11-12 DIAGNOSIS — I5022 Chronic systolic (congestive) heart failure: Secondary | ICD-10-CM | POA: Diagnosis not present

## 2019-11-12 DIAGNOSIS — I5032 Chronic diastolic (congestive) heart failure: Secondary | ICD-10-CM | POA: Diagnosis not present

## 2019-11-12 DIAGNOSIS — J9601 Acute respiratory failure with hypoxia: Secondary | ICD-10-CM | POA: Diagnosis not present

## 2019-11-14 ENCOUNTER — Ambulatory Visit (HOSPITAL_COMMUNITY): Payer: Medicare HMO | Admitting: Physical Therapy

## 2019-11-14 LAB — CUP PACEART REMOTE DEVICE CHECK
Battery Remaining Longevity: 82 mo
Battery Remaining Percentage: 95.5 %
Battery Voltage: 3.11 V
Brady Statistic AP VP Percent: 1 %
Brady Statistic AP VS Percent: 0 %
Brady Statistic AS VP Percent: 96 %
Brady Statistic AS VS Percent: 4.4 %
Brady Statistic RA Percent Paced: 0 %
Date Time Interrogation Session: 20210315165012
HighPow Impedance: 69 Ohm
Implantable Lead Implant Date: 20210308
Implantable Lead Implant Date: 20210308
Implantable Lead Implant Date: 20210308
Implantable Lead Location: 753858
Implantable Lead Location: 753859
Implantable Lead Location: 753860
Implantable Lead Model: 7122
Implantable Pulse Generator Implant Date: 20210308
Lead Channel Impedance Value: 500 Ohm
Lead Channel Impedance Value: 500 Ohm
Lead Channel Impedance Value: 800 Ohm
Lead Channel Pacing Threshold Amplitude: 0.625 V
Lead Channel Pacing Threshold Amplitude: 0.75 V
Lead Channel Pacing Threshold Amplitude: 0.875 V
Lead Channel Pacing Threshold Pulse Width: 0.5 ms
Lead Channel Pacing Threshold Pulse Width: 0.5 ms
Lead Channel Pacing Threshold Pulse Width: 0.5 ms
Lead Channel Sensing Intrinsic Amplitude: 11.8 mV
Lead Channel Sensing Intrinsic Amplitude: 4.7 mV
Lead Channel Setting Pacing Amplitude: 1.375
Lead Channel Setting Pacing Amplitude: 1.625
Lead Channel Setting Pacing Amplitude: 3.5 V
Lead Channel Setting Pacing Pulse Width: 0.5 ms
Lead Channel Setting Pacing Pulse Width: 0.5 ms
Lead Channel Setting Sensing Sensitivity: 0.5 mV
Pulse Gen Serial Number: 111019088

## 2019-11-15 ENCOUNTER — Other Ambulatory Visit: Payer: Self-pay | Admitting: Student

## 2019-11-16 ENCOUNTER — Ambulatory Visit (HOSPITAL_COMMUNITY): Payer: Medicare HMO | Admitting: Physical Therapy

## 2019-11-17 ENCOUNTER — Other Ambulatory Visit: Payer: Self-pay

## 2019-11-17 ENCOUNTER — Ambulatory Visit (INDEPENDENT_AMBULATORY_CARE_PROVIDER_SITE_OTHER): Payer: Medicare HMO | Admitting: *Deleted

## 2019-11-17 DIAGNOSIS — I5021 Acute systolic (congestive) heart failure: Secondary | ICD-10-CM | POA: Diagnosis not present

## 2019-11-17 LAB — CUP PACEART INCLINIC DEVICE CHECK
Battery Remaining Longevity: 68 mo
Brady Statistic RA Percent Paced: 0 %
Brady Statistic RV Percent Paced: 97 %
Date Time Interrogation Session: 20210318112335
HighPow Impedance: 70.875
Implantable Lead Implant Date: 20210308
Implantable Lead Implant Date: 20210308
Implantable Lead Implant Date: 20210308
Implantable Lead Location: 753858
Implantable Lead Location: 753859
Implantable Lead Location: 753860
Implantable Lead Model: 7122
Implantable Pulse Generator Implant Date: 20210308
Lead Channel Impedance Value: 475 Ohm
Lead Channel Impedance Value: 500 Ohm
Lead Channel Impedance Value: 800 Ohm
Lead Channel Pacing Threshold Amplitude: 0.625 V
Lead Channel Pacing Threshold Amplitude: 0.75 V
Lead Channel Pacing Threshold Amplitude: 0.75 V
Lead Channel Pacing Threshold Amplitude: 1.25 V
Lead Channel Pacing Threshold Amplitude: 1.25 V
Lead Channel Pacing Threshold Pulse Width: 0.5 ms
Lead Channel Pacing Threshold Pulse Width: 0.5 ms
Lead Channel Pacing Threshold Pulse Width: 0.5 ms
Lead Channel Pacing Threshold Pulse Width: 0.5 ms
Lead Channel Pacing Threshold Pulse Width: 0.5 ms
Lead Channel Sensing Intrinsic Amplitude: 11.8 mV
Lead Channel Sensing Intrinsic Amplitude: 4.2 mV
Lead Channel Setting Pacing Amplitude: 1.625
Lead Channel Setting Pacing Amplitude: 3.5 V
Lead Channel Setting Pacing Amplitude: 3.5 V
Lead Channel Setting Pacing Pulse Width: 0.5 ms
Lead Channel Setting Pacing Pulse Width: 0.5 ms
Lead Channel Setting Sensing Sensitivity: 0.5 mV
Pulse Gen Serial Number: 111019088

## 2019-11-17 NOTE — Progress Notes (Signed)
  CRT-D device/wound check in office. Steri-strips removed. Wound without redness or edema. Incision edges approximated, wound well healed. Sensing consistent with previous device measurements. Threshold LV cap confirm was not recommended today. LV cap confirm turned off and output increased to 3.5 V for extra safety margin until 3 month visit.  Lead impedance trends stable over time. No mode switch episodes recorded. No ventricular arrhythmia episodes recorded. Patient bi-ventricularly pacing 97% of the time. Device programmed with appropriate safety margins. Heart failure diagnostics reviewed and trends are stable for patient. Estimated longevity 6.4-7.2 years.  Patient enrolled in remote follow up. Plan to check device remotely in 3 months and see in office in 6 months. Patient education completed including shock plan.

## 2019-11-18 ENCOUNTER — Telehealth (HOSPITAL_COMMUNITY): Payer: Self-pay | Admitting: Physical Therapy

## 2019-11-18 NOTE — Telephone Encounter (Signed)
pt called to cancel all of the rest of his appts untill after six weeks per his cardiologist. The pt had a procedure done that his md stated he needed to stay home and rest. Pt understood to call us back to resume therapy.

## 2019-11-21 ENCOUNTER — Telehealth (HOSPITAL_COMMUNITY): Payer: Self-pay | Admitting: Physical Therapy

## 2019-11-21 ENCOUNTER — Ambulatory Visit (HOSPITAL_COMMUNITY): Payer: Medicare HMO | Admitting: Physical Therapy

## 2019-11-21 NOTE — Telephone Encounter (Signed)
Will need re-Auth from insurance for PT visits  when pt returns after his rest from the procudure he had.

## 2019-11-23 ENCOUNTER — Ambulatory Visit (HOSPITAL_COMMUNITY): Payer: Medicare HMO | Admitting: Physical Therapy

## 2019-11-29 ENCOUNTER — Ambulatory Visit: Payer: Medicare HMO | Attending: Internal Medicine

## 2019-11-29 DIAGNOSIS — Z23 Encounter for immunization: Secondary | ICD-10-CM

## 2019-11-29 NOTE — Progress Notes (Signed)
   [id number]  Name:  Roger Lowe.    MRN: [id number] DOB: 01-23-1946  11/29/2019  Roger Lowe was observed post Covid-19 immunization for 15 minutes without incident. He was provided with Vaccine Information Sheet and instruction to access the V-Safe system.   Roger Lowe was instructed to call 911 with any severe reactions post vaccine: [name] Difficulty breathing  . Swelling of face and throat  . A fast heartbeat  . A bad rash all over body  . Dizziness and weakness   Immunizations Administered    Name Date Dose VIS Date Route   Pfizer COVID-19 Vaccine 11/29/2019  2:48 PM 0.3 mL 08/12/2019 Intramuscular   Manufacturer: [place]   Lot: [id number]   [place]: [id number]

## 2019-12-13 DIAGNOSIS — I5022 Chronic systolic (congestive) heart failure: Secondary | ICD-10-CM | POA: Diagnosis not present

## 2019-12-13 DIAGNOSIS — J9601 Acute respiratory failure with hypoxia: Secondary | ICD-10-CM | POA: Diagnosis not present

## 2019-12-13 DIAGNOSIS — I5032 Chronic diastolic (congestive) heart failure: Secondary | ICD-10-CM | POA: Diagnosis not present

## 2019-12-20 ENCOUNTER — Other Ambulatory Visit: Payer: Self-pay | Admitting: Urology

## 2019-12-20 ENCOUNTER — Encounter: Payer: Self-pay | Admitting: Urology

## 2019-12-20 ENCOUNTER — Ambulatory Visit (INDEPENDENT_AMBULATORY_CARE_PROVIDER_SITE_OTHER): Payer: Medicare HMO | Admitting: Urology

## 2019-12-20 ENCOUNTER — Other Ambulatory Visit: Payer: Self-pay

## 2019-12-20 VITALS — BP 126/76 | HR 96 | Temp 98.1°F | Ht 72.0 in | Wt 221.0 lb

## 2019-12-20 DIAGNOSIS — R972 Elevated prostate specific antigen [PSA]: Secondary | ICD-10-CM

## 2019-12-20 DIAGNOSIS — R3915 Urgency of urination: Secondary | ICD-10-CM

## 2019-12-20 LAB — POCT URINALYSIS DIPSTICK
Bilirubin, UA: NEGATIVE
Blood, UA: NEGATIVE
Glucose, UA: NEGATIVE
Ketones, UA: NEGATIVE
Leukocytes, UA: NEGATIVE
Nitrite, UA: NEGATIVE
Protein, UA: POSITIVE — AB
Spec Grav, UA: 1.01 (ref 1.010–1.025)
Urobilinogen, UA: 0.2 E.U./dL
pH, UA: 5 (ref 5.0–8.0)

## 2019-12-20 LAB — PSA: PSA: 5 ng/mL — ABNORMAL HIGH (ref <=4.0)

## 2019-12-20 NOTE — Progress Notes (Signed)
H&P  Chief Complaint: Elevated PSA  History of Present Illness:   4.20.2021: Here today referred per PCP for evaluation of elevated PSA -- measured 4.6 on 9.28.2020. He also reports that he has been having some urinary issues that he was recommended to consult with [place] about. These sx's include some urgency/freq, incomplete emptying, as well as some very rare urge incontinence. He is especially frustrated by only being able to urinate small volumes after a strong urge to urinate. He only drinks 2-3 sodas a week but reports drinking at least 4-5 cups of coffee a day. He has a very strong hx of prostate cancer with his father, some uncles, and his younger brother all having been diagnosed. He also reports that during his time in the [place] he was exposed to agent orange.   Past Medical History:  Diagnosis Date  . Acid reflux   . Arthritis   . Asthma   . Cancer ([place])   . CHF (congestive heart failure) ([place])    a. EF 45-50% by echo in 07/2017 b. EF reduced to 20-25% by repeat echo in 10/2018  . Coronary artery disease    a. cath in 2016 showing mild nonobstructive disease b. cath in 10/2018 showing nonobstructive CAD with 10% LM stenosis, 25% Proximal-LAD, 25% LCx, and mild pulmonary HTN  . Diabetes mellitus without complication ([place])   . DVT (deep venous thrombosis) ([place])   . Gout   . Gout   . Heart attack ([place])   . High cholesterol   . History of pulmonary embolus (PE) 2016  . Hypertension   . Lung cancer ([place])   . Pneumonia   . Stroke [facility])     Past Surgical History:  Procedure Laterality Date  . BIV ICD INSERTION CRT-D N/A 11/07/2019   Procedure: BIV ICD INSERTION CRT-D;  Surgeon: Evans Lance, MD;  Location: [facility];  Service: Cardiovascular;  Laterality: N/A;  . CATARACT EXTRACTION, BILATERAL    . CERVICAL SPINE SURGERY    . NOSE SURGERY    . RIGHT/LEFT HEART CATH AND CORONARY ANGIOGRAPHY N/A 11/08/2018   Procedure: RIGHT/LEFT HEART CATH AND CORONARY ANGIOGRAPHY;   Surgeon: [name], MD;  Location: [facility];  Service: Cardiovascular;  Laterality: N/A;    Home Medications:  Allergies as of 12/20/2019   No Known Allergies     Medication List       Accurate as of December 20, 2019  2:20 PM. If you have any questions, ask your nurse or doctor.        allopurinol 300 MG tablet Commonly known as: ZYLOPRIM Take 300 mg by mouth daily.   amitriptyline 50 MG tablet Commonly known as: ELAVIL Take 50 mg by mouth 2 (two) times daily.   arformoterol 15 MCG/2ML Nebu Commonly known as: BROVANA Take 2 mLs (15 mcg total) by nebulization 2 (two) times daily.   atorvastatin 20 MG tablet Commonly known as: LIPITOR TAKE 1 TABLET BY MOUTH AT BEDTIME   azithromycin 250 MG tablet Commonly known as: ZITHROMAX Take 250 mg by mouth every Monday, Wednesday, and Friday.   budesonide 0.5 MG/2ML nebulizer solution Commonly known as: Pulmicort Take 2 mLs (0.5 mg total) by nebulization 2 (two) times daily.   carvedilol 25 MG tablet Commonly known as: COREG Take 1 tablet (25 mg total) by mouth 2 (two) times daily.   cephALEXin 500 MG capsule Commonly known as: KEFLEX Take 500 mg by mouth every 12 (twelve) hours.  Durezol 0.05 % Emul Generic drug: Difluprednate Place 1 drop into the left eye 3 (three) times daily.   Entresto 97-103 MG Generic drug: sacubitril-valsartan Take 1 tablet by mouth 2 (two) times daily.   ergocalciferol 1.25 MG (50000 UT) capsule Commonly known as: VITAMIN D2 Take 50,000 Units by mouth once a week. Tuesday   famotidine 40 MG tablet Commonly known as: PEPCID Take 40 mg by mouth at bedtime.   furosemide 20 MG tablet Commonly known as: LASIX Take 1 tablet (20 mg total) by mouth daily as needed for fluid.   gabapentin 100 MG capsule Commonly known as: NEURONTIN Take 100 mg by mouth 3 (three) times daily.   hydrALAZINE 50 MG tablet Commonly known as: APRESOLINE Take 1 tablet (50 mg total) by mouth 2  (two) times daily.   HYDROcodone-acetaminophen 10-325 MG tablet Commonly known as: NORCO Take 1 tablet by mouth every 6 (six) hours as needed for moderate pain. What changed: when to take this   insulin detemir 100 UNIT/ML FlexPen Commonly known as: Levemir FlexTouch Inject 45 Units into the skin at bedtime.   ipratropium-albuterol 0.5-2.5 (3) MG/3ML Soln Commonly known as: DUONEB Take 3 mLs by nebulization every 4 (four) hours as needed. What changed: reasons to take this   mupirocin ointment 2 % Commonly known as: BACTROBAN Apply 1 application topically 3 (three) times daily. Apply to toe   neomycin-polymyxin-hydrocortisone 3.5-10000-1 OTIC suspension Commonly known as: CORTISPORIN Place 4 drops into the left ear 4 (four) times daily. X 7 days   nitroGLYCERIN 0.4 MG SL tablet Commonly known as: NITROSTAT Place 1 tablet (0.4 mg total) under the tongue every 5 (five) minutes as needed for chest pain.   NovoLOG FlexPen 100 UNIT/ML FlexPen Generic drug: insulin aspart Inject 25 Units into the skin 3 (three) times daily with meals. Plus personal sliding scale   OVER THE COUNTER MEDICATION Compression vest   OXYGEN Inhale 4 L into the lungs continuous.   ReliOn Pen Needles 31G X 6 MM Misc Generic drug: Insulin Pen Needle USE 1 PEN NEEDLE 4 TIMES DAILY   rivaroxaban 20 MG Tabs tablet Commonly known as: XARELTO Take 1 tablet (20 mg total) by mouth every morning. What changed: when to take this   Spiriva Respimat 2.5 MCG/ACT Aers Generic drug: Tiotropium Bromide Monohydrate Inhale 2 puffs into the lungs daily.   spironolactone 25 MG tablet Commonly known as: ALDACTONE Take 1 tablet (25 mg total) by mouth daily.   Symbicort 160-4.5 MCG/ACT inhaler Generic drug: budesonide-formoterol Inhale 2 puffs into the lungs daily.       Allergies: No Known Allergies  Family History  Problem Relation Age of Onset  . CAD Brother   . Prostate cancer Maternal Uncle      Social History:  reports that he quit smoking about 10 years ago. He has a 20.00 pack-year smoking history. He has never used smokeless tobacco. He reports previous alcohol use. He reports previous drug use.  Urological Symptom Review Patient is experiencing the following symptoms: Hard to postpone urination Get up at night to urinate Leakage of urine Stream starts and stops Trouble starting stream Weak stream Review of Systems Gastrointestinal (upper)  : Negative for upper GI symptoms Gastrointestinal (lower) : Negative for lower GI symptoms  Constitutional : Negative for symptoms Skin: Negative for skin symptoms Eyes: Negative for eye symptoms Ear/Nose/Throat : Sore throat Hematologic/Lymphatic: Negative for Hematologic/Lymphatic symptoms Cardiovascular : Negative for cardiovascular symptoms Respiratory : Shortness of breath Endocrine: Negative for  endocrine symptoms Musculoskeletal: Negative for musculoskeletal symptoms Neurological: Headaches Dizziness Psychologic: Negative for psychiatric symptoms  Physical Exam:  Vital signs in last 24 hours: BP 126/76   Pulse 96   Temp 98.1 F (36.7 C)   Ht 6' (1.829 m)   Wt 221 lb (100.2 kg)   BMI 29.97 kg/m  Constitutional:  Alert and oriented, No acute distress Cardiovascular: Regular rate  Respiratory: Normal respiratory effort, On supplemental oxygen. GI: Abdomen is soft, nontender, nondistended, no abdominal masses. No CVAT. No hernias. Genitourinary: Normal male phallus, testes are descended bilaterally and non-tender and without masses, scrotum is normal in appearance without lesions or masses, perineum is normal on inspection. Prostate feels around 60 grams in size.  Lymphatic: No lymphadenopathy Neurologic: Grossly intact, no focal deficits Psychiatric: Normal mood and affect  Laboratory Data:  No results for input(s): WBC, HGB, HCT, PLT in the last 72 hours.  No results for input(s): NA, K, CL, GLUCOSE,  BUN, CALCIUM, CREATININE in the last 72 hours.  Invalid input(s): CO3   Results for orders placed or performed in visit on 12/20/19 (from the past 24 hour(s))  POCT urinalysis dipstick     Status: Abnormal   Collection Time: 12/20/19  1:56 PM  Result Value Ref Range   Color, UA light yellow    Clarity, UA     Glucose, UA Negative Negative   Bilirubin, UA neg    Ketones, UA neg    Spec Grav, UA 1.010 1.010 - 1.025   Blood, UA neg    pH, UA 5.0 5.0 - 8.0   Protein, UA Positive (A) Negative   Urobilinogen, UA 0.2 0.2 or 1.0 E.U./dL   Nitrite, UA neg    Leukocytes, UA Negative Negative   Appearance clear    Odor     I have reviewed prior pt notes  I have reviewed notes from referring/previous physicians  I have reviewed urinalysis results  I have reviewed prior PSA results  Impression/Assessment:  He does have a strong family hx of prostate cancer and a recently elevated PSA (though we do not have records of his prior trend/baseline). We will recheck today and bx if his PSA remains high. DRE today normal though his gland is enlarged at 60 grams.   His urinary sx's seem more consistent with OAB exacerbated by his medications and liquid diet. Given OAB guide sheet.   Plan:  1. PSA today   2. Pending results, will plan appropriate follow-up.   3. Given OAB guide sheet and advised to try some non-medical management of his urinary sx's.   4. Default OV in 6 mo w/ PSA

## 2019-12-26 DIAGNOSIS — G894 Chronic pain syndrome: Secondary | ICD-10-CM | POA: Diagnosis not present

## 2019-12-26 DIAGNOSIS — G43009 Migraine without aura, not intractable, without status migrainosus: Secondary | ICD-10-CM | POA: Diagnosis not present

## 2019-12-26 DIAGNOSIS — Z0001 Encounter for general adult medical examination with abnormal findings: Secondary | ICD-10-CM | POA: Diagnosis not present

## 2019-12-26 DIAGNOSIS — E782 Mixed hyperlipidemia: Secondary | ICD-10-CM | POA: Diagnosis not present

## 2019-12-26 DIAGNOSIS — Z0189 Encounter for other specified special examinations: Secondary | ICD-10-CM | POA: Diagnosis not present

## 2019-12-26 DIAGNOSIS — D6869 Other thrombophilia: Secondary | ICD-10-CM | POA: Diagnosis not present

## 2019-12-26 DIAGNOSIS — E1165 Type 2 diabetes mellitus with hyperglycemia: Secondary | ICD-10-CM | POA: Diagnosis not present

## 2019-12-26 DIAGNOSIS — E1122 Type 2 diabetes mellitus with diabetic chronic kidney disease: Secondary | ICD-10-CM | POA: Diagnosis not present

## 2019-12-27 ENCOUNTER — Other Ambulatory Visit: Payer: Self-pay

## 2019-12-27 MED ORDER — SPIRONOLACTONE 25 MG PO TABS: 25.0000 mg | ORAL_TABLET | Freq: Every day | ORAL | 3 refills | Status: DC

## 2019-12-27 MED ORDER — NITROGLYCERIN 0.4 MG SL SUBL: 0.4000 mg | SUBLINGUAL_TABLET | SUBLINGUAL | 2 refills | Status: DC | PRN

## 2019-12-27 NOTE — Telephone Encounter (Signed)
Refilled ntg and aldactone

## 2019-12-29 DIAGNOSIS — I10 Essential (primary) hypertension: Secondary | ICD-10-CM | POA: Diagnosis not present

## 2019-12-29 DIAGNOSIS — K219 Gastro-esophageal reflux disease without esophagitis: Secondary | ICD-10-CM | POA: Diagnosis not present

## 2019-12-29 DIAGNOSIS — G894 Chronic pain syndrome: Secondary | ICD-10-CM | POA: Diagnosis not present

## 2019-12-29 DIAGNOSIS — E782 Mixed hyperlipidemia: Secondary | ICD-10-CM | POA: Diagnosis not present

## 2019-12-29 DIAGNOSIS — I25119 Atherosclerotic heart disease of native coronary artery with unspecified angina pectoris: Secondary | ICD-10-CM | POA: Diagnosis not present

## 2019-12-29 DIAGNOSIS — E1165 Type 2 diabetes mellitus with hyperglycemia: Secondary | ICD-10-CM | POA: Diagnosis not present

## 2019-12-29 DIAGNOSIS — I2510 Atherosclerotic heart disease of native coronary artery without angina pectoris: Secondary | ICD-10-CM | POA: Diagnosis not present

## 2019-12-29 DIAGNOSIS — Z8701 Personal history of pneumonia (recurrent): Secondary | ICD-10-CM | POA: Diagnosis not present

## 2019-12-29 DIAGNOSIS — M109 Gout, unspecified: Secondary | ICD-10-CM | POA: Diagnosis not present

## 2019-12-29 DIAGNOSIS — G43009 Migraine without aura, not intractable, without status migrainosus: Secondary | ICD-10-CM | POA: Diagnosis not present

## 2019-12-29 DIAGNOSIS — Z0001 Encounter for general adult medical examination with abnormal findings: Secondary | ICD-10-CM | POA: Diagnosis not present

## 2019-12-30 ENCOUNTER — Other Ambulatory Visit: Payer: Self-pay | Admitting: *Deleted

## 2019-12-30 MED ORDER — SPIRIVA RESPIMAT 2.5 MCG/ACT IN AERS: 2.0000 | INHALATION_SPRAY | Freq: Every day | RESPIRATORY_TRACT | 3 refills | Status: DC

## 2020-01-02 ENCOUNTER — Telehealth: Payer: Self-pay

## 2020-01-02 ENCOUNTER — Encounter (HOSPITAL_COMMUNITY): Payer: Self-pay | Admitting: Physical Therapy

## 2020-01-02 DIAGNOSIS — J449 Chronic obstructive pulmonary disease, unspecified: Secondary | ICD-10-CM | POA: Diagnosis not present

## 2020-01-02 NOTE — Telephone Encounter (Signed)
-----   Message from Dorisann Frames, RN sent at 01/02/2020 10:57 AM EDT -----  ----- Message ----- From: Franchot Gallo, MD Sent: 12/29/2019  10:09 AM EDT To: Dorisann Frames, RN    Notify patient that PSA is about the same at 5.0.  At this point, I do not think it is necessary to proceed with a biopsy.  I will see him back as scheduled in 6 months. ----- Message ----- From: Dorisann Frames, RN Sent: 12/21/2019   9:23 AM EDT To: Franchot Gallo, MD  Please review

## 2020-01-02 NOTE — Therapy (Signed)
[facility] [address] [place], [place], [address] Phone: [phone]   Fax:  [phone]  Patient Details  Name: Roger Lowe. MRN: [id number] Date of Birth: 1946-03-01 Referring Provider:  No ref. provider found  Encounter Date: 01/02/2020   PHYSICAL THERAPY DISCHARGE SUMMARY  Visits from Start of Care: 10  Current functional level related to goals / functional outcomes: Patient has met 1/2  short term goals with improving symptoms but continues to have difficulty completing HEP. He has met 0/4 long term goals at this time due to limited ROM, slow improvement of symptoms, impaired functional mobility, and pain.    Remaining deficits: Patient continues to have impaired ROM, posture, strength, activity tolerance, motor control, and impaired functional mobility.Patient was making slow progress at this time likely due to this being a chronic condition and multiple co-morbidities.    Education / Equipment:  Patient educated on HEP, proper frequency, exam findings, progress made, and positioning of exercises. Plan: Patient agrees to discharge.  Patient goals were not met. Patient is being discharged due to not returning since the last visit.  ?????       1:09 PM, 01/02/20 Mearl Latin PT, DPT Physical Therapist at [facility] [place], [place], [address] Phone: [phone]   Fax:  [phone]

## 2020-01-02 NOTE — Telephone Encounter (Signed)
Left message to return call 

## 2020-01-03 NOTE — Telephone Encounter (Signed)
Left message to return call 

## 2020-01-04 ENCOUNTER — Other Ambulatory Visit: Payer: Self-pay | Admitting: *Deleted

## 2020-01-04 MED ORDER — SPIRIVA RESPIMAT 2.5 MCG/ACT IN AERS: 2.0000 | INHALATION_SPRAY | Freq: Every day | RESPIRATORY_TRACT | 3 refills | Status: DC

## 2020-01-09 ENCOUNTER — Other Ambulatory Visit: Payer: Self-pay

## 2020-01-09 ENCOUNTER — Encounter: Payer: Self-pay | Admitting: *Deleted

## 2020-01-09 ENCOUNTER — Ambulatory Visit (INDEPENDENT_AMBULATORY_CARE_PROVIDER_SITE_OTHER): Payer: Medicare HMO | Admitting: Cardiology

## 2020-01-09 ENCOUNTER — Encounter: Payer: Self-pay | Admitting: Cardiology

## 2020-01-09 VITALS — BP 140/80 | HR 98 | Temp 98.0°F | Ht 72.0 in | Wt 232.0 lb

## 2020-01-09 DIAGNOSIS — I2510 Atherosclerotic heart disease of native coronary artery without angina pectoris: Secondary | ICD-10-CM

## 2020-01-09 DIAGNOSIS — I5022 Chronic systolic (congestive) heart failure: Secondary | ICD-10-CM | POA: Diagnosis not present

## 2020-01-09 DIAGNOSIS — I10 Essential (primary) hypertension: Secondary | ICD-10-CM

## 2020-01-09 NOTE — Progress Notes (Signed)
Clinical Summary Roger Lowe is a 74 y.o.male seen today for follow up of the following medical problems   1. CAD - previously followed a [facility] - 12/2017 LVEF 45% by there records, chronic LBBB - 2016 cath [facility]: nonobstructive disease - 10/2018 cath [name]: nonobstructive disease   - no recent chest pain.   2. Chronic systolic HF - new diagnosis of sysotlic HF 03/6719 - 05/4708 echo LVEF 20-25%. - 10/2018 cath: nonobstructive CAD. 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 Home scale he is 230 lbs, he reports up and down. Takes lasix prn, roughly 3 times a week.   3. History of DVT - on xarelto in definite   4. COPD - on 2L [place] at home, followed pulmonary in [place].    5. Hyperkalemia - 5.8 by 10/2019 - not on supplement, is on entresto and aldactone    6. HTN - mildly elevated , 12/20/19 bp 126/76. 130/80 during 10/2019 preop    Completed covid vaccine x2  Past Medical History:  Diagnosis Date  . Acid reflux   . Arthritis   . Asthma   . Cancer ([place])   . CHF (congestive heart failure) ([place])    a. EF 45-50% by echo in 07/2017 b. EF reduced to 20-25% by repeat echo in 10/2018  . Coronary artery disease    a. cath in 2016 showing mild nonobstructive disease b. cath in 10/2018 showing nonobstructive CAD with 10% LM stenosis, 25% Proximal-LAD, 25% LCx, and mild pulmonary HTN  . Diabetes mellitus without complication ([place])   . DVT (deep venous thrombosis) ([place])   . Gout   . Gout   . Heart attack ([place])   . High cholesterol   . History of pulmonary embolus (PE) 2016  . Hypertension   . Lung cancer ([place])   . Pneumonia   . Stroke [facility])      No Known Allergies   Current Outpatient Medications  Medication Sig Dispense Refill  . allopurinol (ZYLOPRIM) 300 MG tablet Take 300 mg by mouth daily.    [name] amitriptyline (ELAVIL) 50 MG tablet Take 50 mg by mouth 2 (two)  times daily.    [name] arformoterol (BROVANA) 15 MCG/2ML NEBU Take 2 mLs (15 mcg total) by nebulization 2 (two) times daily. 120 mL 6  . atorvastatin (LIPITOR) 20 MG tablet TAKE 1 TABLET BY MOUTH AT BEDTIME 90 tablet 3  . azithromycin (ZITHROMAX) 250 MG tablet Take 250 mg by mouth every Monday, Wednesday, and Friday.    . budesonide (PULMICORT) 0.5 MG/2ML nebulizer solution Take 2 mLs (0.5 mg total) by nebulization 2 (two) times daily. 120 mL 12  . budesonide-formoterol (SYMBICORT) 160-4.5 MCG/ACT inhaler Inhale 2 puffs into the lungs daily.     . carvedilol (COREG) 25 MG tablet Take 1 tablet (25 mg total) by mouth 2 (two) times daily. 180 tablet 3  . cephALEXin (KEFLEX) 500 MG capsule Take 500 mg by mouth every 12 (twelve) hours.    . DUREZOL 0.05 % EMUL Place 1 drop into the left eye 3 (three) times daily.    . ergocalciferol (VITAMIN D2) 1.25 MG (50000 UT) capsule Take 50,000 Units by mouth once a week. Tuesday    . famotidine (PEPCID) 40 MG tablet Take 40 mg by mouth at bedtime.     . furosemide (LASIX) 20 MG tablet Take 1 tablet (  20 mg total) by mouth daily as needed for fluid. 90 tablet 3  . gabapentin (NEURONTIN) 100 MG capsule Take 100 mg by mouth 3 (three) times daily.    . hydrALAZINE (APRESOLINE) 50 MG tablet Take 1 tablet (50 mg total) by mouth 2 (two) times daily. 180 tablet 3  . HYDROcodone-acetaminophen (NORCO) 10-325 MG tablet Take 1 tablet by mouth every 6 (six) hours as needed for moderate pain. (Patient taking differently: Take 1 tablet by mouth every 6 (six) hours. ) 12 tablet 0  . insulin aspart (NOVOLOG FLEXPEN) 100 UNIT/ML FlexPen Inject 25 Units into the skin 3 (three) times daily with meals. Plus personal sliding scale    . Insulin Detemir (LEVEMIR FLEXTOUCH) 100 UNIT/ML Pen Inject 45 Units into the skin at bedtime. 15 mL 0  . ipratropium-albuterol (DUONEB) 0.5-2.5 (3) MG/3ML SOLN Take 3 mLs by nebulization every 4 (four) hours as needed. (Patient taking differently: Take 3 mLs  by nebulization every 4 (four) hours as needed (shortness of breath). ) 180 mL 0  . mupirocin ointment (BACTROBAN) 2 % Apply 1 application topically 3 (three) times daily. Apply to toe    . neomycin-polymyxin-hydrocortisone (CORTISPORIN) 3.5-10000-1 OTIC suspension Place 4 drops into the left ear 4 (four) times daily. X 7 days (Patient not taking: Reported on 10/31/2019) 10 mL 0  . nitroGLYCERIN (NITROSTAT) 0.4 MG SL tablet Place 1 tablet (0.4 mg total) under the tongue every 5 (five) minutes as needed for chest pain. 25 tablet 2  . OVER THE COUNTER MEDICATION Compression vest    . OXYGEN Inhale 4 L into the lungs continuous.    . RELION PEN NEEDLES 31G X 6 MM MISC USE 1 PEN NEEDLE 4 TIMES DAILY    . rivaroxaban (XARELTO) 20 MG TABS tablet Take 1 tablet (20 mg total) by mouth every morning. (Patient taking differently: Take 20 mg by mouth daily. ) 90 tablet 3  . sacubitril-valsartan (ENTRESTO) 97-103 MG Take 1 tablet by mouth 2 (two) times daily. 60 tablet 9  . spironolactone (ALDACTONE) 25 MG tablet Take 1 tablet (25 mg total) by mouth daily. 90 tablet 3  . Tiotropium Bromide Monohydrate (SPIRIVA RESPIMAT) 2.5 MCG/ACT AERS Inhale 2 puffs into the lungs daily. 12 g 3   No current facility-administered medications for this visit.     Past Surgical History:  Procedure Laterality Date  . BIV ICD INSERTION CRT-D N/A 11/07/2019   Procedure: BIV ICD INSERTION CRT-D;  Surgeon: Evans Lance, MD;  Location: [facility];  Service: Cardiovascular;  Laterality: N/A;  . CATARACT EXTRACTION, BILATERAL    . CERVICAL SPINE SURGERY    . NOSE SURGERY    . RIGHT/LEFT HEART CATH AND CORONARY ANGIOGRAPHY N/A 11/08/2018   Procedure: RIGHT/LEFT HEART CATH AND CORONARY ANGIOGRAPHY;  Surgeon: [name], MD;  Location: [facility];  Service: Cardiovascular;  Laterality: N/A;     No Known Allergies    Family History  Problem Relation Age of Onset  . CAD Brother   . Prostate cancer  Maternal Uncle      Social History Roger Lowe reports that he quit smoking about 10 years ago. He has a 20.00 pack-year smoking history. He has never used smokeless tobacco. Roger Lowe reports previous alcohol use.   Review of Systems CONSTITUTIONAL: No weight loss, fever, chills, weakness or fatigue.  HEENT: Eyes: No visual loss, blurred vision, double vision or yellow sclerae.No hearing loss, sneezing, congestion, runny nose or sore throat.  SKIN: No rash or itching.  CARDIOVASCULAR: per hpi RESPIRATORY: No shortness of breath, cough or sputum.  GASTROINTESTINAL: No anorexia, nausea, vomiting or diarrhea. No abdominal pain or blood.  GENITOURINARY: No burning on urination, no polyuria NEUROLOGICAL: No headache, dizziness, syncope, paralysis, ataxia, numbness or tingling in the extremities. No change in bowel or bladder control.  MUSCULOSKELETAL: No muscle, back pain, joint pain or stiffness.  LYMPHATICS: No enlarged nodes. No history of splenectomy.  PSYCHIATRIC: No history of depression or anxiety.  ENDOCRINOLOGIC: No reports of sweating, cold or heat intolerance. No polyuria or polydipsia.  [name]   Physical Examination Today's Vitals   01/09/20 1352  BP: 140/80  Pulse: 98  Temp: 98 F (36.7 C)  SpO2: 97%  Weight: 232 lb (105.2 kg)  Height: 6' (1.829 m)   Body mass index is 31.46 kg/m.  Gen: resting comfortably, no acute distress HEENT: no scleral icterus, pupils equal round and reactive, no palptable cervical adenopathy,  CV: RRR, no m/r/g, no jvd Resp: Clear to auscultation bilaterally GI: abdomen is soft, non-tender, non-distended, normal bowel sounds, no hepatosplenomegaly MSK: extremities are warm, no edema.  Skin: warm, no rash Neuro:  no focal deficits Psych: appropriate affect   Diagnostic Studies   Cardiac cath [facility] 2016 Cath(EF 0.50, +1-+2 MR, Lt main- mild irreg, LAD - no significant dz, LCx- prox irreg, Ramus small vessel ostial 30%, OM1 B vessel  ostial 30%, mid 30%, OM2 small vessel ostial 40%, OM3 small vessel,RCA - dominant; no significant dz; PDA C vessel 12-27-2014   10/2018 echo IMPRESSIONS   1. The left ventricle has severely reduced systolic function of 44-03%. The cavity size was normal. There is mild concentric left ventricular hypertrophy. Indeterminate diastolic function. 2. There is akinesis of the mid-apical anterior and anteroseptal left ventricular segments. Overall septal motion suggests left bundle [place] block. 3. The aortic valve is tricuspid There is mild aortic annular calcification noted. 4. The mitral valve is normal in structure. There is mild calcification. 5. The tricuspid valve is normal in structure. 6. The aortic root is normal in size and structure. 7. Right atrial size was mildly dilated. 8. The right ventricle has moderately reduced systolic function. The cavity was normal. There is no increase in right ventricular wall thickness. Right ventricular systolic pressure could not be assessed.    10/2199 echo IMPRESSIONS    1. Left ventricular ejection fraction, by visual estimation, is 30 to  35%. The left ventricle has moderate to severely decreased function. There  is mildly increased left ventricular wall thickness.  2. The left ventricle demonstrates global hypokinesis.  3. Global right ventricle has low normal systolic function.The right  ventricular size is normal. mildly increased right ventricular wall  thickness.  4. The mitral valve is degenerative.  5. The tricuspid valve was grossly normal.  6. No evidence of aortic valve sclerosis or stenosis.  7. Aortic root could not be assessed.  8. The interatrial septum was not assessed.   Assessment and Plan   1. Chronic systolic HF - no significant symptoms, appears euvolemic - on optimal medical therapy - we will repeat labs, if ongoing issues with high K would adjust his aldactone -repeat echo over next few months  s/p BiV AICD to see if improvement in LVEF   2. CAD - mild nonobstructive diease - no recent symptoms, continue current meds  3. HTN - elevated today but at goal multiple other recnet visits, continue current meds  Arnoldo Lenis, M.D.

## 2020-01-09 NOTE — Patient Instructions (Signed)
Your physician recommends that you schedule a follow-up appointment in: [place]  Your physician recommends that you continue on your current medications as directed. Please refer to the Current Medication list given to you today.  Your physician recommends that you return for lab work BMP/MG  Thank you for choosing [facility]!!

## 2020-01-10 ENCOUNTER — Encounter: Payer: Self-pay | Admitting: *Deleted

## 2020-01-10 NOTE — Telephone Encounter (Signed)
I think travelling to [place] would be fine from a cardiac standpoint   Zandra Abts MD

## 2020-01-12 DIAGNOSIS — I5022 Chronic systolic (congestive) heart failure: Secondary | ICD-10-CM | POA: Diagnosis not present

## 2020-01-12 DIAGNOSIS — J9601 Acute respiratory failure with hypoxia: Secondary | ICD-10-CM | POA: Diagnosis not present

## 2020-01-12 DIAGNOSIS — I5032 Chronic diastolic (congestive) heart failure: Secondary | ICD-10-CM | POA: Diagnosis not present

## 2020-01-13 DIAGNOSIS — E1165 Type 2 diabetes mellitus with hyperglycemia: Secondary | ICD-10-CM | POA: Diagnosis not present

## 2020-01-17 NOTE — Progress Notes (Signed)
Unable to reach pt by phone to give results. Letter mailed to pt as well as sent my chart

## 2020-01-17 NOTE — Telephone Encounter (Signed)
Unable to reach pt by phone. Letter mailed of results and sent via my chart.

## 2020-01-25 DIAGNOSIS — M25551 Pain in right hip: Secondary | ICD-10-CM | POA: Diagnosis not present

## 2020-01-25 DIAGNOSIS — M25552 Pain in left hip: Secondary | ICD-10-CM | POA: Diagnosis not present

## 2020-02-03 ENCOUNTER — Other Ambulatory Visit: Payer: Self-pay

## 2020-02-03 ENCOUNTER — Encounter: Payer: Medicare HMO | Admitting: Internal Medicine

## 2020-02-03 MED ORDER — SPIRONOLACTONE 25 MG PO TABS: 25.0000 mg | ORAL_TABLET | Freq: Every day | ORAL | 3 refills | Status: DC

## 2020-02-03 MED ORDER — ENTRESTO 97-103 MG PO TABS: 1.0000 | ORAL_TABLET | Freq: Two times a day (BID) | ORAL | 3 refills | Status: DC

## 2020-02-03 MED ORDER — ATORVASTATIN CALCIUM 20 MG PO TABS: 20.0000 mg | ORAL_TABLET | Freq: Every day | ORAL | 3 refills | Status: DC

## 2020-02-03 MED ORDER — RIVAROXABAN 20 MG PO TABS: 20.0000 mg | ORAL_TABLET | Freq: Every morning | ORAL | 3 refills | Status: AC

## 2020-02-03 MED ORDER — HYDRALAZINE HCL 50 MG PO TABS: 50.0000 mg | ORAL_TABLET | Freq: Two times a day (BID) | ORAL | 3 refills | Status: DC

## 2020-02-03 MED ORDER — FUROSEMIDE 20 MG PO TABS: 20.0000 mg | ORAL_TABLET | Freq: Every day | ORAL | 3 refills | Status: DC | PRN

## 2020-02-03 MED ORDER — CARVEDILOL 25 MG PO TABS: 25.0000 mg | ORAL_TABLET | Freq: Two times a day (BID) | ORAL | 3 refills | Status: DC

## 2020-02-03 NOTE — Telephone Encounter (Signed)
Per pt email request, change pharmacy to [facility] and refill cardiac drugs-done

## 2020-02-08 ENCOUNTER — Ambulatory Visit (INDEPENDENT_AMBULATORY_CARE_PROVIDER_SITE_OTHER): Payer: Medicare HMO | Admitting: *Deleted

## 2020-02-08 DIAGNOSIS — I429 Cardiomyopathy, unspecified: Secondary | ICD-10-CM

## 2020-02-08 LAB — CUP PACEART REMOTE DEVICE CHECK
Battery Remaining Longevity: 65 mo
Battery Remaining Percentage: 94 %
Battery Voltage: 2.99 V
Brady Statistic AP VP Percent: 1 %
Brady Statistic AP VS Percent: 0 %
Brady Statistic AS VP Percent: 99 %
Brady Statistic AS VS Percent: 1 %
Brady Statistic RA Percent Paced: 1 %
Date Time Interrogation Session: 20210609030033
HighPow Impedance: 69 Ohm
Implantable Lead Implant Date: 20210308
Implantable Lead Implant Date: 20210308
Implantable Lead Implant Date: 20210308
Implantable Lead Location: 753858
Implantable Lead Location: 753859
Implantable Lead Location: 753860
Implantable Lead Model: 7122
Implantable Pulse Generator Implant Date: 20210308
Lead Channel Impedance Value: 390 Ohm
Lead Channel Impedance Value: 540 Ohm
Lead Channel Impedance Value: 780 Ohm
Lead Channel Pacing Threshold Amplitude: 0.5 V
Lead Channel Pacing Threshold Amplitude: 0.75 V
Lead Channel Pacing Threshold Amplitude: 1.25 V
Lead Channel Pacing Threshold Pulse Width: 0.5 ms
Lead Channel Pacing Threshold Pulse Width: 0.5 ms
Lead Channel Pacing Threshold Pulse Width: 0.5 ms
Lead Channel Sensing Intrinsic Amplitude: 11.8 mV
Lead Channel Sensing Intrinsic Amplitude: 4.2 mV
Lead Channel Setting Pacing Amplitude: 1.5 V
Lead Channel Setting Pacing Amplitude: 3.5 V
Lead Channel Setting Pacing Amplitude: 3.5 V
Lead Channel Setting Pacing Pulse Width: 0.5 ms
Lead Channel Setting Pacing Pulse Width: 0.5 ms
Lead Channel Setting Sensing Sensitivity: 0.5 mV
Pulse Gen Serial Number: 111019088

## 2020-02-09 NOTE — Progress Notes (Signed)
Remote ICD transmission.   

## 2020-02-12 DIAGNOSIS — I5022 Chronic systolic (congestive) heart failure: Secondary | ICD-10-CM | POA: Diagnosis not present

## 2020-02-12 DIAGNOSIS — J9601 Acute respiratory failure with hypoxia: Secondary | ICD-10-CM | POA: Diagnosis not present

## 2020-02-12 DIAGNOSIS — I5032 Chronic diastolic (congestive) heart failure: Secondary | ICD-10-CM | POA: Diagnosis not present

## 2020-02-14 ENCOUNTER — Other Ambulatory Visit: Payer: Self-pay | Admitting: Pulmonary Disease

## 2020-02-15 ENCOUNTER — Encounter: Payer: Self-pay | Admitting: Primary Care

## 2020-02-15 ENCOUNTER — Telehealth: Payer: Self-pay | Admitting: Primary Care

## 2020-02-15 ENCOUNTER — Other Ambulatory Visit: Payer: Self-pay

## 2020-02-15 ENCOUNTER — Ambulatory Visit (INDEPENDENT_AMBULATORY_CARE_PROVIDER_SITE_OTHER): Payer: Medicare HMO | Admitting: Primary Care

## 2020-02-15 DIAGNOSIS — J441 Chronic obstructive pulmonary disease with (acute) exacerbation: Secondary | ICD-10-CM | POA: Diagnosis not present

## 2020-02-15 DIAGNOSIS — J9611 Chronic respiratory failure with hypoxia: Secondary | ICD-10-CM

## 2020-02-15 DIAGNOSIS — I5022 Chronic systolic (congestive) heart failure: Secondary | ICD-10-CM | POA: Diagnosis not present

## 2020-02-15 MED ORDER — IPRATROPIUM-ALBUTEROL 0.5-2.5 (3) MG/3ML IN SOLN: 3.0000 mL | Freq: Four times a day (QID) | RESPIRATORY_TRACT | 3 refills | Status: DC | PRN

## 2020-02-15 MED ORDER — IPRATROPIUM-ALBUTEROL 0.5-2.5 (3) MG/3ML IN SOLN: RESPIRATORY_TRACT | 3 refills | Status: DC

## 2020-02-15 MED ORDER — ARFORMOTEROL TARTRATE 15 MCG/2ML IN NEBU: 15.0000 ug | INHALATION_SOLUTION | Freq: Two times a day (BID) | RESPIRATORY_TRACT | 6 refills | Status: DC

## 2020-02-15 MED ORDER — PREDNISONE 10 MG PO TABS: ORAL_TABLET | ORAL | 0 refills | Status: DC

## 2020-02-15 MED ORDER — BUDESONIDE 0.5 MG/2ML IN SUSP: 0.5000 mg | Freq: Two times a day (BID) | RESPIRATORY_TRACT | 12 refills | Status: DC

## 2020-02-15 MED ORDER — DOXYCYCLINE HYCLATE 100 MG PO TABS: 100.0000 mg | ORAL_TABLET | Freq: Two times a day (BID) | ORAL | 0 refills | Status: DC

## 2020-02-15 NOTE — Patient Instructions (Addendum)
Recommendations: Continue to use saline nasal spray twice a day  COPD: Stop Symbicort Continue Brovana nebulizer twice a day Continue Pulmicort nebulizer twice a day Continue Spiriva Respimat once a day (typically in the morning) You can use Duoneb (ipratropium-albuterol) every 4 hours for breakthrough shortness of breath or wheezing   Rx: Doxycycline 1 tab twice daily x 1 week Prednisone 20mg  x 5 days   Follow-up: 3-4 month fu with Dr. Valeta Harms or sooner if needed    COPD and Physical Activity Chronic obstructive pulmonary disease (COPD) is a long-term (chronic) condition that affects the lungs. COPD is a general term that can be used to describe many different lung problems that cause lung swelling (inflammation) and limit airflow, including chronic bronchitis and emphysema. The main symptom of COPD is shortness of breath, which makes it harder to do even simple tasks. This can also make it harder to exercise and be active. Talk with your health care provider about treatments to help you breathe better and actions you can take to prevent breathing problems during physical activity. What are the benefits of exercising with COPD? Exercising regularly is an important part of a healthy lifestyle. You can still exercise and do physical activities even though you have COPD. Exercise and physical activity improve your shortness of breath by increasing blood flow (circulation). This causes your heart to pump more oxygen through your body. Moderate exercise can improve your:  Oxygen use.  Energy level.  Shortness of breath.  Strength in your breathing muscles.  Heart health.  Sleep.  Self-esteem and feelings of self-worth.  Depression, stress, and anxiety levels. Exercise can benefit everyone with COPD. The severity of your disease may affect how hard you can exercise, especially at first, but everyone can benefit. Talk with your health care provider about how much exercise is safe for  you, and which activities and exercises are safe for you. What actions can I take to prevent breathing problems during physical activity?  Sign up for a pulmonary rehabilitation program. This type of program may include: ? Education about lung diseases. ? Exercise classes that teach you how to exercise and be more active while improving your breathing. This usually involves:  Exercise using your lower extremities, such as a stationary bicycle.  About 30 minutes of exercise, 2 to 5 times per week, for 6 to 12 weeks  Strength training, such as push ups or leg lifts. ? Nutrition education. ? Group classes in which you can talk with others who also have COPD and learn ways to manage stress.  If you use an oxygen tank, you should use it while you exercise. Work with your health care provider to adjust your oxygen for your physical activity. Your resting flow rate is different from your flow rate during physical activity.  While you are exercising: ? Take slow breaths. ? Pace yourself and do not try to go too fast. ? Purse your lips while breathing out. Pursing your lips is similar to a kissing or whistling position. ? If doing exercise that uses a quick burst of effort, such as weight lifting:  Breathe in before starting the exercise.  Breathe out during the hardest part of the exercise (such as raising the weights). Where to find support You can find support for exercising with COPD from:  Your health care provider.  A pulmonary rehabilitation program.  Your local health department or community health programs.  Support groups, online or in-person. Your health care provider may be able to  recommend support groups. Where to find more information You can find more information about exercising with COPD from:  American Lung Association: ClassInsider.se.  COPD Foundation: https://www.rivera.net/. Contact a health care provider if:  Your symptoms get worse.  You have chest pain.  You have  nausea.  You have a fever.  You have trouble talking or catching your breath.  You want to start a new exercise program or a new activity. Summary  COPD is a general term that can be used to describe many different lung problems that cause lung swelling (inflammation) and limit airflow. This includes chronic bronchitis and emphysema.  Exercise and physical activity improve your shortness of breath by increasing blood flow (circulation). This causes your heart to provide more oxygen to your body.  Contact your health care provider before starting any exercise program or new activity. Ask your health care provider what exercises and activities are safe for you. This information is not intended to replace advice given to you by your health care provider. Make sure you discuss any questions you have with your health care provider. Document Revised: 12/08/2018 Document Reviewed: 09/10/2017 Elsevier Patient Education  2020 [employer].

## 2020-02-15 NOTE — Progress Notes (Signed)
$'@Patient'Z$  ID: Roger Lowe., male    DOB: [birth year], 74 y.o.   MRN: [id number]  Chief Complaint  Patient presents with   Follow-up    pt had a defribraltor insalled 2 months ago    Referring provider: [name], MD  HPI: 74 year old male, former smoker.  Past medical history significant for COPD stage 3, chronic hypoxic respiratory failure, community-acquired pneumonia left lower lung, pleural effusion bilateral, cardiomyopathy, chronic systolic heart failure, PE/DVT, mediastinal adenopathy, hypertension, SIRS. Patient of Dr. Valeta Harms, last seen 10/05/2019.  Maintained on Brovana/Pulmicort and Spiriva Respimat.  Patient is on chronic oxygen 2 L.  Patient diagnosed with systolic heart failure in February 2020.  Left ventricular ejection fraction 20 to 25%.  Heart cath in March 2020 showed nonobstructive coronary artery disease.  Echocardiogram in February 2021 showed EF 30 to 35%, status post BiV AICD placement in March 2021.  Lasix as needed roughly 3 times a week.  He is on Xarelto for history of DVT.   Previous [facility] pulmonary encounters: 74 year old gentleman past medical history of congestive heart failure, EF 20 to 25%, diabetes, history of PE, hypertension, lung cancer (? S/p resection on left side, patient unsure if upper or lower). Last PFTS completed 2 years ago in [place]. Saw pulmonologist in [place] regularly. Currently on duonebs, symbiocort 160, Azithro MWF, vest therapy. We currently dont have records from [place] pulmonologist. We will need to request these.  He uses it some vest therapy regularly.  He does state that he had several hospitalizations in [place] with recurrent pneumonias.  After being placed on the vest therapy had significant improvement with his airway clearance.  Currently using duo nebs only 1-2 times per week.  Not on triple therapy at this time.  He did have a recent hospitalization.  Most recent ejection fraction was depressed.  We discussed goals of care today in the  office as well.  He does have durable DNR forms completed at home.  We discussed the benefit utility of having a DNR medical bracelet or necklace tag to help ensure that his wishes are met.  OV 06/07/2019: Patient here for follow-up regarding his COPD.  Recent follow-up for CAT scan completed in September.  CT scan was completed which revealed left upper lobe airspace disease concerning for pneumonia.  This was not present on imaging from February 2020.  Recent changes to his medication regimen after last visit.  We stopped his Symbicort went to nebulized therapy.  Currently on Brovana plus Pulmicort, Spiriva Respimat 2 puffs once a day.  He has been doing well with this new regimen.  He does feel like his breathing is better from the comparison before.  He is also followed up closely with the cardiology and heart failure clinic.  Otherwise he is very happy with his new team/health care team after moving from [place].  Patient denies fevers chills night sweats weight loss sputum production above his baseline.  OV 10/05/2019: doing ok from a breathing standpoint. He has been trying to sustain his exercise level. He went out walking yesterday. Follows regularly with cardiology. He has been seen by orthopedics and cervical spine disease. At this point surgery was concerned about his chance at recovery.  Patient denies fevers chills night sweats weight loss.  He does have sputum production but this is at his baseline.  He is maintained well on Brovana plus Pulmicort plus Spiriva Respimat  02/15/2020 Patient presents today for regular follow-up. He had a pacemaker placed in March  2021. He has had a couple of episodes of shortness of breath since last visit. He reports acute symptoms of sinus congestion and productive cough with green mucus for 2-3 weeks.  He has some associated chest tightness and wheezing. He is taking his Brovana/pulmicort nebulizer twice a day and Spiriva respimat 2 puffs once daily as prescribed. He  is also using SYMBICORT in addition to his nebulizers. He is on Azithromycin '250mg'$  MWF.    No Known Allergies  Immunization History  Administered Date(s) Administered   Fluad Quad(high Dose 65+) 06/07/2019   Influenza Split 05/14/2015   Influenza, Seasonal, Injecte, Preservative Fre 05/25/2015   PFIZER SARS-COV-2 Vaccination 10/30/2019, 11/29/2019   Pneumococcal Conjugate-13 08/09/2012, 09/13/2015   Pneumococcal Polysaccharide-23 09/13/2011    Past Medical History:  Diagnosis Date   Acid reflux    Arthritis    Asthma    Cancer ([place])    CHF (congestive heart failure) ([place])    a. EF 45-50% by echo in 07/2017 b. EF reduced to 20-25% by repeat echo in 10/2018   Coronary artery disease    a. cath in 2016 showing mild nonobstructive disease b. cath in 10/2018 showing nonobstructive CAD with 10% LM stenosis, 25% Proximal-LAD, 25% LCx, and mild pulmonary HTN   Diabetes mellitus without complication (HCC)    DVT (deep venous thrombosis) (HCC)    Gout    Gout    Heart attack ([place])    High cholesterol    History of pulmonary embolus (PE) 2016   Hypertension    Lung cancer ([place])    Pneumonia    Stroke ([place])     Tobacco History: Social History   Tobacco Use  Smoking Status Former Smoker   Packs/day: 1.00   Years: 20.00   Pack years: 20.00   Quit date: 09/04/2009   Years since quitting: 10.4  Smokeless Tobacco Never Used   Counseling given: Not Answered   Outpatient Medications Prior to Visit  Medication Sig Dispense Refill   allopurinol (ZYLOPRIM) 300 MG tablet Take 300 mg by mouth daily.     amitriptyline (ELAVIL) 50 MG tablet Take 50 mg by mouth 2 (two) times daily.     atorvastatin (LIPITOR) 20 MG tablet Take 1 tablet (20 mg total) by mouth at bedtime. 90 tablet 3   azithromycin (ZITHROMAX) 250 MG tablet Take 250 mg by mouth every Monday, Wednesday, and Friday.     budesonide-formoterol (SYMBICORT) 160-4.5 MCG/ACT inhaler Inhale 2 puffs  into the lungs daily.      carvedilol (COREG) 25 MG tablet Take 1 tablet (25 mg total) by mouth 2 (two) times daily. 180 tablet 3   ergocalciferol (VITAMIN D2) 1.25 MG (50000 UT) capsule Take 50,000 Units by mouth once a week. Tuesday     famotidine (PEPCID) 40 MG tablet Take 40 mg by mouth at bedtime.      furosemide (LASIX) 20 MG tablet Take 1 tablet (20 mg total) by mouth daily as needed for fluid. 90 tablet 3   gabapentin (NEURONTIN) 100 MG capsule Take 100 mg by mouth 3 (three) times daily.     hydrALAZINE (APRESOLINE) 50 MG tablet Take 1 tablet (50 mg total) by mouth 2 (two) times daily. 180 tablet 3   HYDROcodone-acetaminophen (NORCO) 10-325 MG tablet Take 1 tablet by mouth every 6 (six) hours as needed for moderate pain. 12 tablet 0   Insulin Detemir (LEVEMIR FLEXTOUCH) 100 UNIT/ML Pen Inject 45 Units into the skin at bedtime. 15 mL 0   nitroGLYCERIN (  NITROSTAT) 0.4 MG SL tablet Place 1 tablet (0.4 mg total) under the tongue every 5 (five) minutes as needed for chest pain. 25 tablet 2   OVER THE COUNTER MEDICATION Compression vest     OXYGEN Inhale 4 L into the lungs continuous.     RELION PEN NEEDLES 31G X 6 MM MISC USE 1 PEN NEEDLE 4 TIMES DAILY     rivaroxaban (XARELTO) 20 MG TABS tablet Take 1 tablet (20 mg total) by mouth every morning. 90 tablet 3   sacubitril-valsartan (ENTRESTO) 97-103 MG Take 1 tablet by mouth 2 (two) times daily. 180 tablet 3   spironolactone (ALDACTONE) 25 MG tablet Take 1 tablet (25 mg total) by mouth daily. 90 tablet 3   Tiotropium Bromide Monohydrate (SPIRIVA RESPIMAT) 2.5 MCG/ACT AERS Inhale 2 puffs into the lungs daily. 12 g 3   arformoterol (BROVANA) 15 MCG/2ML NEBU Take 2 mLs (15 mcg total) by nebulization 2 (two) times daily. 120 mL 6   budesonide (PULMICORT) 0.5 MG/2ML nebulizer solution Take 2 mLs (0.5 mg total) by nebulization 2 (two) times daily. 120 mL 12   insulin aspart (NOVOLOG FLEXPEN) 100 UNIT/ML FlexPen Inject 25 Units into  the skin 3 (three) times daily with meals. Plus personal sliding scale (Patient not taking: Reported on 02/16/2020)     ipratropium-albuterol (DUONEB) 0.5-2.5 (3) MG/3ML SOLN Take 3 mLs by nebulization every 4 (four) hours as needed. 180 mL 0   mupirocin ointment (BACTROBAN) 2 % Apply 1 application topically 3 (three) times daily. Apply to toe (Patient not taking: Reported on 02/16/2020)     cephALEXin (KEFLEX) 500 MG capsule Take 500 mg by mouth every 12 (twelve) hours.     No facility-administered medications prior to visit.    Review of Systems  Review of Systems  HENT: Positive for congestion.   Respiratory: Positive for cough, chest tightness, shortness of breath and wheezing.     Physical Exam  BP (!) 142/86    Pulse 100    Temp 97.6 F (36.4 C) (Oral)    Ht 6' 1"  (1.854 m)    Wt 230 lb 3.2 oz (104.4 kg)    SpO2 92%    BMI 30.37 kg/m  Physical Exam Constitutional:      Appearance: Normal appearance.  HENT:     Head: Normocephalic and atraumatic.  Cardiovascular:     Rate and Rhythm: Normal rate and regular rhythm.  Pulmonary:     Effort: Pulmonary effort is normal.     Breath sounds: Normal breath sounds.  Neurological:     General: No focal deficit present.     Mental Status: He is alert and oriented to person, place, and time. Mental status is at baseline.      Lab Results:  CBC    Component Value Date/Time   WBC 9.6 11/07/2019 0924   RBC 4.82 11/07/2019 0924   HGB 13.6 11/07/2019 1111   HCT 40.0 11/07/2019 1111   PLT 291 11/07/2019 0924   MCV 93.4 11/07/2019 0924   MCH 29.7 11/07/2019 0924   MCHC 31.8 11/07/2019 0924   RDW 13.4 11/07/2019 0924   LYMPHSABS 2.3 02/01/2019 1117   MONOABS 0.8 02/01/2019 1117   EOSABS 0.2 02/01/2019 1117   BASOSABS 0.0 02/01/2019 1117    BMET    Component Value Date/Time   NA 137 02/16/2020 1156   K 4.2 02/16/2020 1156   CL 100 02/16/2020 1156   CO2 27 02/16/2020 1156   GLUCOSE 122 (H) 02/16/2020  1156   BUN 12  02/16/2020 1156   CREATININE 1.23 02/16/2020 1156   CALCIUM 9.6 02/16/2020 1156   GFRNONAA 58 (L) 02/16/2020 1156   GFRAA >60 02/16/2020 1156    BNP    Component Value Date/Time   BNP 499.0 (H) 10/11/2018 2151    ProBNP No results found for: PROBNP  Imaging: CUP PACEART INCLINIC DEVICE CHECK  Result Date: 02/16/2020 CRT-D device check in office. Thresholds and sensing consistent with previous device measurements. Lead impedance trends stable over time. No mode switch episodes recorded. No ventricular arrhythmia episodes recorded. Patient bi-ventricularly pacing >99%  of the time. Device programmed with appropriate safety margins. RA/LV outputs reduced to 2.0V per GT, RV auto capture enabled. Estimated longevity 6.8-7.3 year. Patient enrolled in remote follow up. Patient education completed including shock plan. Merlin on 05/09/20 and ROV with GT/R in 1 year.Levander Campion BSN, RN, CCDS  CUP PACEART REMOTE DEVICE CHECK  Result Date: 02/08/2020 Scheduled remote reviewed. Normal device function.  Next remote 91 days. Kathy Breach, RN, CCDS, [employer]    Assessment & Plan:   COPD with acute exacerbation (HCC) - Productive cough with purulent mucus x 2-3 weeks with associated chest tightness/wheezing - Rx doxycyline 1 tab twice daily x 1 week; prednisone 55m x 5 days - Stop Symbicort. Continue Brovana +Pulmicort nebulizer twice daily AND spriva respimat 2.569m once daily  - Continue azithromycin 25065mWF    Chronic hypoxemic respiratory failure (HCC) - Continue to benefit from 2L supplemental oxygen  [place] heart [place] S/p biV AICD in March 2021 - Continues lasix 11m77m needed, takes on average 3 times a week  - Following with cardiology    ElizMartyn Ehrich 02/17/2020

## 2020-02-15 NOTE — Telephone Encounter (Signed)
New rx was sent stating every 6 hours and as needed

## 2020-02-16 ENCOUNTER — Encounter: Payer: Self-pay | Admitting: Internal Medicine

## 2020-02-16 ENCOUNTER — Other Ambulatory Visit (HOSPITAL_COMMUNITY)
Admission: RE | Admit: 2020-02-16 | Discharge: 2020-02-16 | Disposition: A | Discharge status: Home / Self Care | Payer: Medicare HMO | Source: Ambulatory Visit | Attending: Cardiology | Admitting: Cardiology

## 2020-02-16 ENCOUNTER — Ambulatory Visit (INDEPENDENT_AMBULATORY_CARE_PROVIDER_SITE_OTHER): Payer: Medicare HMO | Admitting: Internal Medicine

## 2020-02-16 VITALS — BP 186/102 | HR 93 | Ht 73.0 in | Wt 227.0 lb

## 2020-02-16 DIAGNOSIS — I5022 Chronic systolic (congestive) heart failure: Secondary | ICD-10-CM

## 2020-02-16 DIAGNOSIS — J449 Chronic obstructive pulmonary disease, unspecified: Secondary | ICD-10-CM | POA: Diagnosis not present

## 2020-02-16 DIAGNOSIS — I428 Other cardiomyopathies: Secondary | ICD-10-CM | POA: Diagnosis not present

## 2020-02-16 DIAGNOSIS — J9601 Acute respiratory failure with hypoxia: Secondary | ICD-10-CM | POA: Diagnosis not present

## 2020-02-16 DIAGNOSIS — Z95810 Presence of automatic (implantable) cardiac defibrillator: Secondary | ICD-10-CM | POA: Diagnosis not present

## 2020-02-16 LAB — BASIC METABOLIC PANEL
Anion gap: 10 (ref 5–15)
BUN: 12 mg/dL (ref 8–23)
CO2: 27 mmol/L (ref 22–32)
Calcium: 9.6 mg/dL (ref 8.9–10.3)
Chloride: 100 mmol/L (ref 98–111)
Creatinine, Ser: 1.23 mg/dL (ref 0.61–1.24)
GFR calc Af Amer: >60 mL/min (ref >60)
GFR calc non Af Amer: 58 mL/min — ABNORMAL LOW (ref >60)
Glucose, Bld: 122 mg/dL — ABNORMAL HIGH (ref 70–99)
Potassium: 4.2 mmol/L (ref 3.5–5.1)
Sodium: 137 mmol/L (ref 135–145)

## 2020-02-16 LAB — CUP PACEART INCLINIC DEVICE CHECK
Battery Remaining Longevity: 81 mo
Brady Statistic RA Percent Paced: 0 %
Brady Statistic RV Percent Paced: 99.68 %
Date Time Interrogation Session: 20210617172913
HighPow Impedance: 76.5 Ohm
Implantable Lead Implant Date: 20210308
Implantable Lead Implant Date: 20210308
Implantable Lead Implant Date: 20210308
Implantable Lead Location: 753858
Implantable Lead Location: 753859
Implantable Lead Location: 753860
Implantable Lead Model: 7122
Implantable Pulse Generator Implant Date: 20210308
Lead Channel Impedance Value: 450 Ohm
Lead Channel Impedance Value: 525 Ohm
Lead Channel Impedance Value: 812.5 Ohm
Lead Channel Pacing Threshold Amplitude: 0.5 V
Lead Channel Pacing Threshold Amplitude: 0.75 V
Lead Channel Pacing Threshold Amplitude: 1 V
Lead Channel Pacing Threshold Pulse Width: 0.5 ms
Lead Channel Pacing Threshold Pulse Width: 0.5 ms
Lead Channel Pacing Threshold Pulse Width: 0.5 ms
Lead Channel Sensing Intrinsic Amplitude: 11.8 mV
Lead Channel Sensing Intrinsic Amplitude: 4.5 mV
Lead Channel Setting Pacing Amplitude: 1.5 V
Lead Channel Setting Pacing Amplitude: 2 V
Lead Channel Setting Pacing Amplitude: 2 V
Lead Channel Setting Pacing Pulse Width: 0.5 ms
Lead Channel Setting Pacing Pulse Width: 0.5 ms
Lead Channel Setting Sensing Sensitivity: 0.5 mV
Pulse Gen Serial Number: 111019088

## 2020-02-16 LAB — MAGNESIUM: Magnesium: 1.9 mg/dL (ref 1.7–2.4)

## 2020-02-16 NOTE — Patient Instructions (Signed)
Medication Instructions:  Your physician recommends that you continue on your current medications as directed. Please refer to the Current Medication list given to you today.  *If you need a refill on your cardiac medications before your next appointment, please call your pharmacy*   Lab Work: NONE   If you have labs (blood work) drawn today and your tests are completely normal, you will receive your results only by: . MyChart Message (if you have MyChart) OR . A paper copy in the mail If you have any lab test that is abnormal or we need to change your treatment, we will call you to review the results.   Testing/Procedures: NONE    Follow-Up: At [facility], you and your health needs are our priority.  As part of our continuing mission to provide you with exceptional heart care, we have created designated Provider Care Teams.  These Care Teams include your primary Cardiologist (physician) and Advanced Practice Providers (APPs -  Physician Assistants and Nurse Practitioners) who all work together to provide you with the care you need, when you need it.  We recommend signing up for the patient portal called "MyChart".  Sign up information is provided on this After Visit Summary.  MyChart is used to connect with patients for Virtual Visits (Telemedicine).  Patients are able to view lab/test results, encounter notes, upcoming appointments, etc.  Non-urgent messages can be sent to your provider as well.   To learn more about what you can do with MyChart, go to [email or web address].    Your next appointment:   1 year(s)  The format for your next appointment:   In Person  Provider:   Gregg Taylor, MD   Other Instructions Thank you for choosing [facility]!    

## 2020-02-16 NOTE — Progress Notes (Signed)
HPI Roger Lowe returns today for followup. He is a pleasant 74 yo man with a h/o CAD, chronic systolic heart failure due to a non-ischemic CM, and LBBB who underwent BIV ICD insertion 3 months ago. In the interim, he notes that his dyspnea is improved. He denies chest pain. He also has COPD and is on home oxygen. He denies ICD therapy.  No Known Allergies   Current Outpatient Medications  Medication Sig Dispense Refill  . allopurinol (ZYLOPRIM) 300 MG tablet Take 300 mg by mouth daily.    [name] amitriptyline (ELAVIL) 50 MG tablet Take 50 mg by mouth 2 (two) times daily.    [name] arformoterol (BROVANA) 15 MCG/2ML NEBU Take 2 mLs (15 mcg total) by nebulization 2 (two) times daily. 120 mL 6  . atorvastatin (LIPITOR) 20 MG tablet Take 1 tablet (20 mg total) by mouth at bedtime. 90 tablet 3  . azithromycin (ZITHROMAX) 250 MG tablet Take 250 mg by mouth every Monday, Wednesday, and Friday.    . budesonide (PULMICORT) 0.5 MG/2ML nebulizer solution Take 2 mLs (0.5 mg total) by nebulization 2 (two) times daily. 120 mL 12  . budesonide-formoterol (SYMBICORT) 160-4.5 MCG/ACT inhaler Inhale 2 puffs into the lungs daily.     . carvedilol (COREG) 25 MG tablet Take 1 tablet (25 mg total) by mouth 2 (two) times daily. 180 tablet 3  . doxycycline (VIBRA-TABS) 100 MG tablet Take 1 tablet (100 mg total) by mouth 2 (two) times daily. 14 tablet 0  . ergocalciferol (VITAMIN D2) 1.25 MG (50000 UT) capsule Take 50,000 Units by mouth once a week. Tuesday    . famotidine (PEPCID) 40 MG tablet Take 40 mg by mouth at bedtime.     . furosemide (LASIX) 20 MG tablet Take 1 tablet (20 mg total) by mouth daily as needed for fluid. 90 tablet 3  . gabapentin (NEURONTIN) 100 MG capsule Take 100 mg by mouth 3 (three) times daily.    . hydrALAZINE (APRESOLINE) 50 MG tablet Take 1 tablet (50 mg total) by mouth 2 (two) times daily. 180 tablet 3  . HYDROcodone-acetaminophen (NORCO) 10-325 MG tablet Take 1 tablet by mouth every 6 (six)  hours as needed for moderate pain. 12 tablet 0  . Insulin Detemir (LEVEMIR FLEXTOUCH) 100 UNIT/ML Pen Inject 45 Units into the skin at bedtime. 15 mL 0  . ipratropium-albuterol (DUONEB) 0.5-2.5 (3) MG/3ML SOLN 1 vial in neb every 6 hours and as needed 180 mL 3  . nitroGLYCERIN (NITROSTAT) 0.4 MG SL tablet Place 1 tablet (0.4 mg total) under the tongue every 5 (five) minutes as needed for chest pain. 25 tablet 2  . OVER THE COUNTER MEDICATION Compression vest    . OXYGEN Inhale 4 L into the lungs continuous.    . predniSONE (DELTASONE) 10 MG tablet Take 2 tabs x 5 days 10 tablet 0  . RELION PEN NEEDLES 31G X 6 MM MISC USE 1 PEN NEEDLE 4 TIMES DAILY    . rivaroxaban (XARELTO) 20 MG TABS tablet Take 1 tablet (20 mg total) by mouth every morning. 90 tablet 3  . sacubitril-valsartan (ENTRESTO) 97-103 MG Take 1 tablet by mouth 2 (two) times daily. 180 tablet 3  . spironolactone (ALDACTONE) 25 MG tablet Take 1 tablet (25 mg total) by mouth daily. 90 tablet 3  . Tiotropium Bromide Monohydrate (SPIRIVA RESPIMAT) 2.5 MCG/ACT AERS Inhale 2 puffs into the lungs daily. 12 g 3   No current facility-administered medications for this visit.  Past Medical History:  Diagnosis Date  . Acid reflux   . Arthritis   . Asthma   . Cancer ([place])   . CHF (congestive heart failure) ([place])    a. EF 45-50% by echo in 07/2017 b. EF reduced to 20-25% by repeat echo in 10/2018  . Coronary artery disease    a. cath in 2016 showing mild nonobstructive disease b. cath in 10/2018 showing nonobstructive CAD with 10% LM stenosis, 25% Proximal-LAD, 25% LCx, and mild pulmonary HTN  . Diabetes mellitus without complication ([place])   . DVT (deep venous thrombosis) ([place])   . Gout   . Gout   . Heart attack ([place])   . High cholesterol   . History of pulmonary embolus (PE) 2016  . Hypertension   . Lung cancer ([place])   . Pneumonia   . Stroke ([place])     ROS:   All systems reviewed and negative except as noted in the  HPI.   Past Surgical History:  Procedure Laterality Date  . BIV ICD INSERTION CRT-D N/A 11/07/2019   Procedure: BIV ICD INSERTION CRT-D;  Surgeon: Evans Lance, MD;  Location: [facility];  Service: Cardiovascular;  Laterality: N/A;  . CATARACT EXTRACTION, BILATERAL    . CERVICAL SPINE SURGERY    . NOSE SURGERY    . RIGHT/LEFT HEART CATH AND CORONARY ANGIOGRAPHY N/A 11/08/2018   Procedure: RIGHT/LEFT HEART CATH AND CORONARY ANGIOGRAPHY;  Surgeon: [name], MD;  Location: [facility];  Service: Cardiovascular;  Laterality: N/A;     Family History  Problem Relation Age of Onset  . CAD Brother   . Prostate cancer Maternal Uncle      Social History   Socioeconomic History  . Marital status: Single    Spouse name: Not on file  . Number of children: 3  . Years of education: Not on file  . Highest education level: Not on file  Occupational History  . Not on file  Tobacco Use  . Smoking status: Former Smoker    Packs/day: 1.00    Years: 20.00    Pack years: 20.00    Quit date: 09/04/2009    Years since quitting: 10.4  . Smokeless tobacco: Never Used  Vaping Use  . Vaping Use: Never used  Substance and Sexual Activity  . Alcohol use: Not Currently  . Drug use: Not Currently  . Sexual activity: Not on file  Other Topics Concern  . Not on file  Social History Narrative  . Not on file   Social Determinants of Health   Financial Resource Strain:   . Difficulty of Paying Living Expenses:   Food Insecurity:   . Worried About Charity fundraiser in the Last Year:   . Arboriculturist in the Last Year:   Transportation Needs:   . Film/video editor (Medical):   [name] Lack of Transportation (Non-Medical):   Physical Activity:   . Days of Exercise per Week:   . Minutes of Exercise per Session:   Stress:   . Feeling of Stress :   Social Connections:   . Frequency of Communication with Friends and Family:   . Frequency of Social Gatherings with  Friends and Family:   . Attends Religious Services:   . Active Member of Clubs or Organizations:   . Attends Archivist Meetings:   [name] Marital Status:   Intimate Partner Violence:   . Fear of Current or Ex-Partner:   .  Emotionally Abused:   [name] Physically Abused:   . Sexually Abused:      BP (!) 186/102   Pulse 93   Ht 6\' 1"  (1.854 m)   Wt 227 lb (103 kg)   SpO2 97%   BMI 29.95 kg/m   Physical Exam:  stable appearing NAD HEENT: Unremarkable Neck:  No JVD, no thyromegally Lymphatics:  No adenopathy Back:  No CVA tenderness Lungs:  Clear with scattered basilar rales HEART:  Regular rate rhythm, no murmurs, no rubs, no clicks Abd:  soft, positive bowel sounds, no organomegally, no rebound, no guarding Ext:  2 plus pulses, no edema, no cyanosis, no clubbing Skin:  No rashes no nodules Neuro:  CN II through XII intact, motor grossly intact  DEVICE  Normal St. Jude biv ICD device function.  See PaceArt for details.   Assess/Plan: 1. Chronic systolic heart failure - he has class 2 symptoms. Overall he thinks his breathing is better since his biv ICD insertion. 2. ICD - his St. Jude Biv ICD has been interrogated and is working normally. We will recheck in several months. 3. HTN - his bp is high today though he did not take his meds this morning. We will consider uptitration though he is on very high dose of GDMT.  Mikle Bosworth.D.

## 2020-02-17 DIAGNOSIS — I5022 Chronic systolic (congestive) heart failure: Secondary | ICD-10-CM | POA: Insufficient documentation

## 2020-02-17 DIAGNOSIS — J449 Chronic obstructive pulmonary disease, unspecified: Secondary | ICD-10-CM | POA: Insufficient documentation

## 2020-02-17 NOTE — Progress Notes (Signed)
PCCM: Thanks for seeing him  Garner Nash, DO [facility] 02/17/2020 2:47 PM

## 2020-02-17 NOTE — Assessment & Plan Note (Addendum)
-   Productive cough with purulent mucus x 2-3 weeks with associated chest tightness/wheezing - Rx doxycyline 1 tab twice daily x 1 week; prednisone 20mg  x 5 days - Stop Symbicort. Continue Brovana +Pulmicort nebulizer twice daily AND spriva respimat 2.20mcg once daily  - Continue azithromycin 250mg  MWF

## 2020-02-17 NOTE — Assessment & Plan Note (Addendum)
-   S/p biV AICD in March 2021 - Continues lasix 20mg  as needed, takes on average 3 times a week  - Following with cardiology

## 2020-02-17 NOTE — Assessment & Plan Note (Signed)
-   Continue to benefit from 2L supplemental oxygen

## 2020-02-20 ENCOUNTER — Other Ambulatory Visit: Payer: Self-pay | Admitting: Cardiology

## 2020-02-20 NOTE — Telephone Encounter (Signed)
Per Pt please call medications to [facility] now   Please call to verify meds [phone]   Thanks renee

## 2020-02-21 ENCOUNTER — Telehealth: Payer: Self-pay | Admitting: *Deleted

## 2020-02-21 NOTE — Telephone Encounter (Signed)
Per Roselind Rily, front office, patient wanted insulin refill.Pt messaged Bernerd Pho, PA-C who encouraged patient to see pcp for insulin refills

## 2020-02-21 NOTE — Telephone Encounter (Signed)
-----   Message from Arnoldo Lenis, MD sent at 02/21/2020 10:33 AM EDT ----- Normal labs   Zandra Abts MD

## 2020-02-21 NOTE — Telephone Encounter (Signed)
Pt aware - routed to pcp  

## 2020-02-22 ENCOUNTER — Other Ambulatory Visit: Payer: Self-pay | Admitting: *Deleted

## 2020-02-22 MED ORDER — AZITHROMYCIN 250 MG PO TABS: 250.0000 mg | ORAL_TABLET | ORAL | 2 refills | Status: DC

## 2020-03-13 ENCOUNTER — Other Ambulatory Visit: Payer: Self-pay | Admitting: Primary Care

## 2020-03-13 DIAGNOSIS — I5022 Chronic systolic (congestive) heart failure: Secondary | ICD-10-CM | POA: Diagnosis not present

## 2020-03-13 DIAGNOSIS — J9601 Acute respiratory failure with hypoxia: Secondary | ICD-10-CM | POA: Diagnosis not present

## 2020-03-13 DIAGNOSIS — I5032 Chronic diastolic (congestive) heart failure: Secondary | ICD-10-CM | POA: Diagnosis not present

## 2020-03-13 MED ORDER — AZITHROMYCIN 250 MG PO TABS: 250.0000 mg | ORAL_TABLET | ORAL | 2 refills | Status: DC

## 2020-03-14 DIAGNOSIS — J449 Chronic obstructive pulmonary disease, unspecified: Secondary | ICD-10-CM | POA: Diagnosis not present

## 2020-03-14 DIAGNOSIS — J9601 Acute respiratory failure with hypoxia: Secondary | ICD-10-CM | POA: Diagnosis not present

## 2020-03-14 DIAGNOSIS — I5022 Chronic systolic (congestive) heart failure: Secondary | ICD-10-CM | POA: Diagnosis not present

## 2020-03-20 ENCOUNTER — Emergency Department (HOSPITAL_COMMUNITY): Payer: Medicare HMO

## 2020-03-20 ENCOUNTER — Observation Stay (HOSPITAL_COMMUNITY)
Admission: EM | Admit: 2020-03-20 | Discharge: 2020-03-21 | Disposition: A | Discharge status: Home / Self Care | Payer: Medicare HMO | Attending: Internal Medicine | Admitting: Internal Medicine

## 2020-03-20 ENCOUNTER — Other Ambulatory Visit: Payer: Self-pay

## 2020-03-20 DIAGNOSIS — Z79899 Other long term (current) drug therapy: Secondary | ICD-10-CM | POA: Insufficient documentation

## 2020-03-20 DIAGNOSIS — Z20822 Contact with and (suspected) exposure to covid-19: Secondary | ICD-10-CM | POA: Diagnosis not present

## 2020-03-20 DIAGNOSIS — I10 Essential (primary) hypertension: Secondary | ICD-10-CM | POA: Diagnosis not present

## 2020-03-20 DIAGNOSIS — I5022 Chronic systolic (congestive) heart failure: Secondary | ICD-10-CM | POA: Diagnosis not present

## 2020-03-20 DIAGNOSIS — Z85118 Personal history of other malignant neoplasm of bronchus and lung: Secondary | ICD-10-CM | POA: Diagnosis not present

## 2020-03-20 DIAGNOSIS — Z794 Long term (current) use of insulin: Secondary | ICD-10-CM | POA: Diagnosis not present

## 2020-03-20 DIAGNOSIS — Z7901 Long term (current) use of anticoagulants: Secondary | ICD-10-CM | POA: Diagnosis not present

## 2020-03-20 DIAGNOSIS — Z87891 Personal history of nicotine dependence: Secondary | ICD-10-CM | POA: Insufficient documentation

## 2020-03-20 DIAGNOSIS — Z950 Presence of cardiac pacemaker: Secondary | ICD-10-CM | POA: Insufficient documentation

## 2020-03-20 DIAGNOSIS — E119 Type 2 diabetes mellitus without complications: Secondary | ICD-10-CM | POA: Diagnosis not present

## 2020-03-20 DIAGNOSIS — G8194 Hemiplegia, unspecified affecting left nondominant side: Secondary | ICD-10-CM | POA: Diagnosis not present

## 2020-03-20 DIAGNOSIS — J449 Chronic obstructive pulmonary disease, unspecified: Secondary | ICD-10-CM | POA: Insufficient documentation

## 2020-03-20 DIAGNOSIS — R930 Abnormal findings on diagnostic imaging of skull and head, not elsewhere classified: Secondary | ICD-10-CM | POA: Diagnosis not present

## 2020-03-20 DIAGNOSIS — R0789 Other chest pain: Principal | ICD-10-CM | POA: Insufficient documentation

## 2020-03-20 DIAGNOSIS — I2510 Atherosclerotic heart disease of native coronary artery without angina pectoris: Secondary | ICD-10-CM | POA: Insufficient documentation

## 2020-03-20 DIAGNOSIS — J90 Pleural effusion, not elsewhere classified: Secondary | ICD-10-CM | POA: Diagnosis not present

## 2020-03-20 DIAGNOSIS — R079 Chest pain, unspecified: Secondary | ICD-10-CM | POA: Diagnosis not present

## 2020-03-20 DIAGNOSIS — R531 Weakness: Secondary | ICD-10-CM | POA: Diagnosis not present

## 2020-03-20 DIAGNOSIS — I110 Hypertensive heart disease with heart failure: Secondary | ICD-10-CM | POA: Diagnosis not present

## 2020-03-20 DIAGNOSIS — S199XXA Unspecified injury of neck, initial encounter: Secondary | ICD-10-CM | POA: Diagnosis not present

## 2020-03-20 DIAGNOSIS — S0990XA Unspecified injury of head, initial encounter: Secondary | ICD-10-CM | POA: Diagnosis not present

## 2020-03-20 LAB — CBC WITH DIFFERENTIAL/PLATELET
Abs Immature Granulocytes: 0.03 10*3/uL (ref 0.00–0.07)
Basophils Absolute: 0 10*3/uL (ref 0.0–0.1)
Basophils Relative: 1 %
Eosinophils Absolute: 0.3 10*3/uL (ref 0.0–0.5)
Eosinophils Relative: 5 %
HCT: 45.6 % (ref 39.0–52.0)
Hemoglobin: 14 g/dL (ref 13.0–17.0)
Immature Granulocytes: 0 %
Lymphocytes Relative: 27 %
Lymphs Abs: 1.9 10*3/uL (ref 0.7–4.0)
MCH: 29.4 pg (ref 26.0–34.0)
MCHC: 30.7 g/dL (ref 30.0–36.0)
MCV: 95.6 fL (ref 80.0–100.0)
Monocytes Absolute: 1 10*3/uL (ref 0.1–1.0)
Monocytes Relative: 14 %
Neutro Abs: 3.6 10*3/uL (ref 1.7–7.7)
Neutrophils Relative %: 53 %
Platelets: 233 10*3/uL (ref 150–400)
RBC: 4.77 MIL/uL (ref 4.22–5.81)
RDW: 14.2 % (ref 11.5–15.5)
WBC: 6.8 10*3/uL (ref 4.0–10.5)
nRBC: 0 % (ref 0.0–0.2)

## 2020-03-20 LAB — BASIC METABOLIC PANEL
Anion gap: 10 (ref 5–15)
BUN: 6 mg/dL — ABNORMAL LOW (ref 8–23)
CO2: 24 mmol/L (ref 22–32)
Calcium: 9.1 mg/dL (ref 8.9–10.3)
Chloride: 105 mmol/L (ref 98–111)
Creatinine, Ser: 1.17 mg/dL (ref 0.61–1.24)
GFR calc Af Amer: >60 mL/min (ref >60)
GFR calc non Af Amer: >60 mL/min (ref >60)
Glucose, Bld: 117 mg/dL — ABNORMAL HIGH (ref 70–99)
Potassium: 3.9 mmol/L (ref 3.5–5.1)
Sodium: 139 mmol/L (ref 135–145)

## 2020-03-20 LAB — CBG MONITORING, ED: Glucose-Capillary: 97 mg/dL (ref 70–99)

## 2020-03-20 LAB — SARS CORONAVIRUS 2 BY RT PCR (HOSPITAL ORDER, PERFORMED IN ~~LOC~~ HOSPITAL LAB): SARS Coronavirus 2: NEGATIVE

## 2020-03-20 LAB — TROPONIN I (HIGH SENSITIVITY)
Troponin I (High Sensitivity): 20 ng/L — ABNORMAL HIGH (ref <18)
Troponin I (High Sensitivity): 18 ng/L — ABNORMAL HIGH (ref <18)

## 2020-03-20 LAB — CK: Total CK: 84 U/L (ref 49–397)
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
## 2020-03-20 MED ORDER — AMITRIPTYLINE HCL 25 MG PO TABS
50.0000 mg | ORAL_TABLET | Freq: Two times a day (BID) | ORAL | Status: DC
  Administered 2020-03-20 – 2020-03-21 (×2): 50 mg via ORAL
  Filled 2020-03-20 (×2): qty 2

## 2020-03-20 MED ORDER — ALBUTEROL SULFATE (2.5 MG/3ML) 0.083% IN NEBU: 2.5000 mg | INHALATION_SOLUTION | Freq: Four times a day (QID) | RESPIRATORY_TRACT | Status: DC | PRN

## 2020-03-20 MED ORDER — SPIRONOLACTONE 25 MG PO TABS
25.0000 mg | ORAL_TABLET | Freq: Every day | ORAL | Status: DC
  Administered 2020-03-20 – 2020-03-21 (×2): 25 mg via ORAL
  Filled 2020-03-20 (×2): qty 1

## 2020-03-20 MED ORDER — MORPHINE SULFATE (PF) 2 MG/ML IV SOLN: 2.0000 mg | Freq: Once | INTRAVENOUS | Status: DC

## 2020-03-20 MED ORDER — LORAZEPAM 2 MG/ML IJ SOLN: 0.5000 mg | Freq: Four times a day (QID) | INTRAMUSCULAR | Status: DC | PRN

## 2020-03-20 MED ORDER — ENOXAPARIN SODIUM 40 MG/0.4ML ~~LOC~~ SOLN
40.0000 mg | Freq: Every day | SUBCUTANEOUS | Status: DC
  Administered 2020-03-21: 40 mg via SUBCUTANEOUS
  Filled 2020-03-20: qty 0.4

## 2020-03-20 MED ORDER — ALLOPURINOL 300 MG PO TABS
300.0000 mg | ORAL_TABLET | Freq: Every day | ORAL | Status: DC
  Administered 2020-03-20 – 2020-03-21 (×2): 300 mg via ORAL
  Filled 2020-03-20: qty 1
  Filled 2020-03-20: qty 3

## 2020-03-20 MED ORDER — CARVEDILOL 25 MG PO TABS
25.0000 mg | ORAL_TABLET | Freq: Two times a day (BID) | ORAL | Status: DC
  Administered 2020-03-20 – 2020-03-21 (×2): 25 mg via ORAL
  Filled 2020-03-20 (×2): qty 1

## 2020-03-20 MED ORDER — NITROGLYCERIN 0.4 MG SL SUBL: 0.4000 mg | SUBLINGUAL_TABLET | SUBLINGUAL | Status: DC | PRN

## 2020-03-20 MED ORDER — GABAPENTIN 100 MG PO CAPS
100.0000 mg | ORAL_CAPSULE | Freq: Three times a day (TID) | ORAL | Status: DC
  Administered 2020-03-20 – 2020-03-21 (×3): 100 mg via ORAL
  Filled 2020-03-20 (×3): qty 1

## 2020-03-20 MED ORDER — LORAZEPAM 0.5 MG PO TABS: 0.5000 mg | ORAL_TABLET | Freq: Four times a day (QID) | ORAL | Status: DC | PRN

## 2020-03-20 MED ORDER — INSULIN DETEMIR 100 UNIT/ML ~~LOC~~ SOLN
45.0000 [IU] | Freq: Every day | SUBCUTANEOUS | Status: DC
  Administered 2020-03-20: 45 [IU] via SUBCUTANEOUS
  Filled 2020-03-20 (×2): qty 0.45

## 2020-03-20 MED ORDER — ACETAMINOPHEN 650 MG RE SUPP: 650.0000 mg | RECTAL | Status: DC | PRN

## 2020-03-20 MED ORDER — FUROSEMIDE 20 MG PO TABS: 20.0000 mg | ORAL_TABLET | Freq: Every day | ORAL | Status: DC | PRN

## 2020-03-20 MED ORDER — HYDRALAZINE HCL 50 MG PO TABS
50.0000 mg | ORAL_TABLET | Freq: Two times a day (BID) | ORAL | Status: DC
  Administered 2020-03-20 – 2020-03-21 (×2): 50 mg via ORAL
  Filled 2020-03-20 (×2): qty 1

## 2020-03-20 MED ORDER — INSULIN ASPART 100 UNIT/ML ~~LOC~~ SOLN
0.0000 [IU] | Freq: Three times a day (TID) | SUBCUTANEOUS | Status: DC
  Administered 2020-03-21: 3 [IU] via SUBCUTANEOUS

## 2020-03-20 MED ORDER — FAMOTIDINE 20 MG PO TABS
40.0000 mg | ORAL_TABLET | Freq: Every day | ORAL | Status: DC
  Administered 2020-03-20: 40 mg via ORAL
  Filled 2020-03-20: qty 2

## 2020-03-20 MED ORDER — IPRATROPIUM-ALBUTEROL 0.5-2.5 (3) MG/3ML IN SOLN: 3.0000 mL | Freq: Four times a day (QID) | RESPIRATORY_TRACT | Status: DC | PRN

## 2020-03-20 MED ORDER — ARFORMOTEROL TARTRATE 15 MCG/2ML IN NEBU
15.0000 ug | INHALATION_SOLUTION | Freq: Two times a day (BID) | RESPIRATORY_TRACT | Status: DC
  Administered 2020-03-20: 15 ug via RESPIRATORY_TRACT
  Filled 2020-03-20 (×2): qty 2

## 2020-03-20 MED ORDER — ATORVASTATIN CALCIUM 10 MG PO TABS
20.0000 mg | ORAL_TABLET | Freq: Every day | ORAL | Status: DC
  Administered 2020-03-20: 20 mg via ORAL
  Filled 2020-03-20: qty 2

## 2020-03-20 MED ORDER — RIVAROXABAN 20 MG PO TABS: 20.0000 mg | ORAL_TABLET | Freq: Every day | ORAL | Status: DC

## 2020-03-20 MED ORDER — RIVAROXABAN 20 MG PO TABS: 20.0000 mg | ORAL_TABLET | Freq: Every morning | ORAL | Status: DC

## 2020-03-20 MED ORDER — SACUBITRIL-VALSARTAN 97-103 MG PO TABS
1.0000 | ORAL_TABLET | Freq: Two times a day (BID) | ORAL | Status: DC
  Administered 2020-03-20 – 2020-03-21 (×2): 1 via ORAL
  Filled 2020-03-20 (×3): qty 1

## 2020-03-20 MED ORDER — ASPIRIN 81 MG PO CHEW
324.0000 mg | CHEWABLE_TABLET | Freq: Once | ORAL | Status: AC
  Administered 2020-03-20: 324 mg via ORAL
  Filled 2020-03-20: qty 4

## 2020-03-20 MED ORDER — INSULIN ASPART 100 UNIT/ML ~~LOC~~ SOLN: 0.0000 [IU] | Freq: Every day | SUBCUTANEOUS | Status: DC

## 2020-03-20 MED ORDER — OXYCODONE HCL 5 MG PO TABS
5.0000 mg | ORAL_TABLET | ORAL | Status: DC | PRN
  Administered 2020-03-21: 5 mg via ORAL
  Filled 2020-03-20: qty 1

## 2020-03-20 MED ORDER — OXYCODONE HCL 5 MG PO TABS
5.0000 mg | ORAL_TABLET | Freq: Once | ORAL | Status: AC
  Administered 2020-03-20: 5 mg via ORAL
  Filled 2020-03-20: qty 1

## 2020-03-20 MED ORDER — STROKE: EARLY STAGES OF RECOVERY BOOK
Freq: Once | Status: DC
  Filled 2020-03-20: qty 1

## 2020-03-20 MED ORDER — MORPHINE SULFATE (PF) 2 MG/ML IV SOLN: 2.0000 mg | INTRAVENOUS | Status: DC | PRN

## 2020-03-20 MED ORDER — NITROGLYCERIN 0.4 MG SL SUBL
0.4000 mg | SUBLINGUAL_TABLET | SUBLINGUAL | Status: DC | PRN
  Administered 2020-03-20 (×3): 0.4 mg via SUBLINGUAL
  Filled 2020-03-20: qty 1

## 2020-03-20 MED ORDER — HYDROCODONE-ACETAMINOPHEN 10-325 MG PO TABS: 1.0000 | ORAL_TABLET | Freq: Four times a day (QID) | ORAL | Status: DC | PRN

## 2020-03-20 MED ORDER — ACETAMINOPHEN 325 MG PO TABS: 650.0000 mg | ORAL_TABLET | ORAL | Status: DC | PRN

## 2020-03-20 MED ORDER — ACETAMINOPHEN 160 MG/5ML PO SOLN: 650.0000 mg | ORAL | Status: DC | PRN

## 2020-03-20 MED ORDER — BUDESONIDE 0.5 MG/2ML IN SUSP
0.5000 mg | Freq: Two times a day (BID) | RESPIRATORY_TRACT | Status: DC
  Administered 2020-03-20 – 2020-03-21 (×2): 0.5 mg via RESPIRATORY_TRACT
  Filled 2020-03-20 (×2): qty 2
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
## 2020-03-20 NOTE — ED Notes (Signed)
Admitting MD paged for PRN pain medication due to pt's complaint of increasing soreness
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
## 2020-03-20 NOTE — ED Notes (Signed)
Pt states he is a very difficult stick, often requires PICC line at admission. Blood draw deferred until orders placed.

## 2020-03-21 ENCOUNTER — Encounter (HOSPITAL_COMMUNITY): Payer: Self-pay | Admitting: Internal Medicine

## 2020-03-21 ENCOUNTER — Observation Stay (HOSPITAL_COMMUNITY): Payer: Medicare HMO

## 2020-03-21 DIAGNOSIS — I10 Essential (primary) hypertension: Secondary | ICD-10-CM | POA: Diagnosis not present

## 2020-03-21 DIAGNOSIS — D181 Lymphangioma, any site: Secondary | ICD-10-CM | POA: Diagnosis not present

## 2020-03-21 DIAGNOSIS — M5021 Other cervical disc displacement,  high cervical region: Secondary | ICD-10-CM | POA: Diagnosis not present

## 2020-03-21 DIAGNOSIS — M4802 Spinal stenosis, cervical region: Secondary | ICD-10-CM | POA: Diagnosis not present

## 2020-03-21 DIAGNOSIS — R0789 Other chest pain: Secondary | ICD-10-CM | POA: Diagnosis not present

## 2020-03-21 DIAGNOSIS — M47812 Spondylosis without myelopathy or radiculopathy, cervical region: Secondary | ICD-10-CM | POA: Diagnosis not present

## 2020-03-21 DIAGNOSIS — S0990XA Unspecified injury of head, initial encounter: Secondary | ICD-10-CM | POA: Diagnosis not present

## 2020-03-21 DIAGNOSIS — G319 Degenerative disease of nervous system, unspecified: Secondary | ICD-10-CM | POA: Diagnosis not present

## 2020-03-21 DIAGNOSIS — G9389 Other specified disorders of brain: Secondary | ICD-10-CM | POA: Diagnosis not present

## 2020-03-21 LAB — LIPID PANEL
Cholesterol: 147 mg/dL (ref 0–200)
HDL: 35 mg/dL — ABNORMAL LOW (ref >40)
LDL Cholesterol: 90 mg/dL (ref 0–99)
Total CHOL/HDL Ratio: 4.2 RATIO
Triglycerides: 108 mg/dL (ref <150)
VLDL: 22 mg/dL (ref 0–40)

## 2020-03-21 LAB — GLUCOSE, CAPILLARY
Glucose-Capillary: 118 mg/dL — ABNORMAL HIGH (ref 70–99)
Glucose-Capillary: 103 mg/dL — ABNORMAL HIGH (ref 70–99)
Glucose-Capillary: 169 mg/dL — ABNORMAL HIGH (ref 70–99)

## 2020-03-21 LAB — HEMOGLOBIN A1C
Hgb A1c MFr Bld: 7.1 % — ABNORMAL HIGH (ref 4.8–5.6)
Mean Plasma Glucose: 157.07 mg/dL
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
## 2020-03-21 MED ORDER — ARFORMOTEROL TARTRATE 15 MCG/2ML IN NEBU
15.0000 ug | INHALATION_SOLUTION | Freq: Two times a day (BID) | RESPIRATORY_TRACT | Status: DC
  Filled 2020-03-21 (×2): qty 2

## 2020-03-21 MED ORDER — RIVAROXABAN 20 MG PO TABS
20.0000 mg | ORAL_TABLET | Freq: Every morning | ORAL | Status: DC
  Administered 2020-03-21: 20 mg via ORAL
  Filled 2020-03-21: qty 1

## 2020-03-21 MED ORDER — BUDESONIDE 0.5 MG/2ML IN SUSP: 0.5000 mg | Freq: Two times a day (BID) | RESPIRATORY_TRACT | Status: DC
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
## 2020-03-21 NOTE — Progress Notes (Signed)
NEUROSURGERY PROGRESS NOTES  MRI brain and c spine reviewed. MRI brain confirms fluid collection is chronic hygroma. No role for NS intervention for this. He can restart his xarelto. MRI c spine with multilevel degenerative changes with varying degrees of central and foraminal stenosis. No high grade central canal stenosis. His history regarding subjective worsening left sided numbness, tingling and weakness was reportedly already  improving by the time I saw him. He does not need any NS intervention for his neck. He can follow up as needed.  He can be discharged from a NS perspective.

## 2020-03-21 NOTE — Plan of Care (Signed)
  Problem: Education: Goal: Knowledge of General Education information will improve Description: Including pain rating scale, medication(s)/side effects and non-pharmacologic comfort measures Outcome: Completed/Met   Problem: Health Behavior/Discharge Planning: Goal: Ability to manage health-related needs will improve Outcome: Completed/Met   Problem: Clinical Measurements: Goal: Ability to maintain clinical measurements within normal limits will improve Outcome: Completed/Met Goal: Will remain free from infection Outcome: Completed/Met   Problem: Activity: Goal: Risk for activity intolerance will decrease Outcome: Completed/Met   Problem: Coping: Goal: Level of anxiety will decrease Outcome: Completed/Met   Problem: Pain Managment: Goal: General experience of comfort will improve Outcome: Completed/Met   Problem: Safety: Goal: Ability to remain free from injury will improve Outcome: Completed/Met   Problem: Acute Rehab PT Goals(only PT should resolve) Goal: Pt Will Ambulate Outcome: Completed/Met Goal: Pt Will Go Up/Down Stairs Outcome: Completed/Met  Discharge information discussed with patient.  These included, but were not limited to, the following:  discharge medications, follow up appointments, when to call the MD, CHF education, importance of daily weights, activity restrictions, importance of periodic rest during activity, recommended diet, etc.  Comprehension of information was ascertained via "teach-back" technique.  Patient discharged to private residence via private vehicle driven by family member.  Escorted to exit via wheelchair by nurse tech.

## 2020-03-21 NOTE — Discharge Summary (Addendum)
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  ProBNP (last 3 results) No results for input(s): PROBNP in the last 8760 hours.  CBG: Recent Labs  Lab 03/20/20 2152 03/21/20 0536 03/21/20 1342 03/21/20 1610  GLUCAP 97 118* 103* 169*       Signed:  Kayleen Memos, MD [facility] 03/21/2020, 5:47 PM
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
## 2020-03-23 DIAGNOSIS — Z0001 Encounter for general adult medical examination with abnormal findings: Secondary | ICD-10-CM | POA: Diagnosis not present

## 2020-03-30 ENCOUNTER — Other Ambulatory Visit: Payer: Self-pay | Admitting: *Deleted

## 2020-04-02 DIAGNOSIS — Z87828 Personal history of other (healed) physical injury and trauma: Secondary | ICD-10-CM | POA: Diagnosis not present

## 2020-04-02 DIAGNOSIS — Z0189 Encounter for other specified special examinations: Secondary | ICD-10-CM | POA: Diagnosis not present

## 2020-04-02 DIAGNOSIS — M542 Cervicalgia: Secondary | ICD-10-CM | POA: Diagnosis not present

## 2020-04-02 DIAGNOSIS — I2510 Atherosclerotic heart disease of native coronary artery without angina pectoris: Secondary | ICD-10-CM | POA: Diagnosis not present

## 2020-04-05 ENCOUNTER — Other Ambulatory Visit: Payer: Self-pay

## 2020-04-05 ENCOUNTER — Ambulatory Visit (HOSPITAL_COMMUNITY): Payer: Medicare HMO | Attending: Internal Medicine | Admitting: Physical Therapy

## 2020-04-05 DIAGNOSIS — M542 Cervicalgia: Secondary | ICD-10-CM | POA: Diagnosis not present

## 2020-04-05 DIAGNOSIS — M545 Low back pain, unspecified: Secondary | ICD-10-CM

## 2020-04-05 DIAGNOSIS — R262 Difficulty in walking, not elsewhere classified: Secondary | ICD-10-CM | POA: Diagnosis not present
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
## 2020-04-09 ENCOUNTER — Ambulatory Visit (HOSPITAL_COMMUNITY): Payer: Medicare HMO | Admitting: Physical Therapy

## 2020-04-09 ENCOUNTER — Other Ambulatory Visit: Payer: Self-pay

## 2020-04-09 ENCOUNTER — Encounter (HOSPITAL_COMMUNITY): Payer: Self-pay | Admitting: Physical Therapy

## 2020-04-09 DIAGNOSIS — R262 Difficulty in walking, not elsewhere classified: Secondary | ICD-10-CM | POA: Diagnosis not present

## 2020-04-09 DIAGNOSIS — M545 Low back pain, unspecified: Secondary | ICD-10-CM

## 2020-04-09 DIAGNOSIS — M542 Cervicalgia: Secondary | ICD-10-CM | POA: Diagnosis not present
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
## 2020-04-09 NOTE — Patient Instructions (Signed)
Access Code: [email or web address] URL: [email or web address] Date: 04/09/2020 Prepared by: Josue Hector  Exercises Supine Transversus Abdominis Bracing - Hands on Stomach - 2 x daily - 7 x weekly - 2 sets - 10 reps - 5 sec hold Hooklying Gluteal Sets - 2 x daily - 7 x weekly - 2 sets - 10 reps - 5 sec hold Supine Lower Trunk Rotation - 2 x daily - 7 x weekly - 1 sets - 5 reps - 10 sec hold

## 2020-04-11 ENCOUNTER — Other Ambulatory Visit: Payer: Self-pay

## 2020-04-11 ENCOUNTER — Encounter: Payer: Self-pay | Admitting: Cardiology

## 2020-04-11 ENCOUNTER — Ambulatory Visit (INDEPENDENT_AMBULATORY_CARE_PROVIDER_SITE_OTHER): Payer: Medicare HMO | Admitting: Cardiology

## 2020-04-11 ENCOUNTER — Telehealth: Payer: Self-pay | Admitting: Emergency Medicine

## 2020-04-11 VITALS — BP 130/82 | HR 100 | Ht 73.0 in | Wt 235.0 lb

## 2020-04-11 DIAGNOSIS — I5022 Chronic systolic (congestive) heart failure: Secondary | ICD-10-CM

## 2020-04-11 DIAGNOSIS — I10 Essential (primary) hypertension: Secondary | ICD-10-CM | POA: Diagnosis not present

## 2020-04-11 DIAGNOSIS — I2510 Atherosclerotic heart disease of native coronary artery without angina pectoris: Secondary | ICD-10-CM
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
## 2020-04-11 NOTE — Patient Instructions (Signed)
Your physician recommends that you schedule a follow-up appointment in: [place]  Your physician recommends that you continue on your current medications as directed. Please refer to the Current Medication list given to you today.  Thank you for choosing [facility]!!

## 2020-04-11 NOTE — Telephone Encounter (Signed)
Patient contacted. Requested that patient send remote transmission to assess leads. Patient reports he is not home at this time and he will send a remote transmission when he gets home. Requested patient contact our office after he sends transmission. Patient agreed to send transmission.

## 2020-04-12 ENCOUNTER — Ambulatory Visit (HOSPITAL_COMMUNITY): Payer: Medicare HMO | Admitting: Physical Therapy

## 2020-04-12 ENCOUNTER — Encounter (HOSPITAL_COMMUNITY): Payer: Self-pay | Admitting: Physical Therapy

## 2020-04-12 DIAGNOSIS — M545 Low back pain, unspecified: Secondary | ICD-10-CM

## 2020-04-12 DIAGNOSIS — R262 Difficulty in walking, not elsewhere classified: Secondary | ICD-10-CM

## 2020-04-12 DIAGNOSIS — M542 Cervicalgia: Secondary | ICD-10-CM
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
## 2020-04-13 DIAGNOSIS — I5022 Chronic systolic (congestive) heart failure: Secondary | ICD-10-CM | POA: Diagnosis not present

## 2020-04-13 DIAGNOSIS — J9601 Acute respiratory failure with hypoxia: Secondary | ICD-10-CM | POA: Diagnosis not present

## 2020-04-13 DIAGNOSIS — I5032 Chronic diastolic (congestive) heart failure: Secondary | ICD-10-CM | POA: Diagnosis not present

## 2020-04-16 ENCOUNTER — Other Ambulatory Visit: Payer: Self-pay

## 2020-04-16 ENCOUNTER — Ambulatory Visit (HOSPITAL_COMMUNITY): Payer: Medicare HMO | Admitting: Physical Therapy

## 2020-04-16 DIAGNOSIS — R262 Difficulty in walking, not elsewhere classified: Secondary | ICD-10-CM

## 2020-04-16 DIAGNOSIS — M542 Cervicalgia: Secondary | ICD-10-CM

## 2020-04-16 DIAGNOSIS — M545 Low back pain, unspecified: Secondary | ICD-10-CM
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
## 2020-04-17 DIAGNOSIS — J9601 Acute respiratory failure with hypoxia: Secondary | ICD-10-CM | POA: Diagnosis not present

## 2020-04-17 DIAGNOSIS — I5022 Chronic systolic (congestive) heart failure: Secondary | ICD-10-CM | POA: Diagnosis not present

## 2020-04-17 DIAGNOSIS — J449 Chronic obstructive pulmonary disease, unspecified: Secondary | ICD-10-CM | POA: Diagnosis not present

## 2020-04-19 ENCOUNTER — Telehealth (HOSPITAL_COMMUNITY): Payer: Self-pay

## 2020-04-19 ENCOUNTER — Ambulatory Visit (HOSPITAL_COMMUNITY): Payer: Medicare HMO

## 2020-04-19 NOTE — Telephone Encounter (Signed)
Pt called he is not feeling well today and will not be there

## 2020-04-24 ENCOUNTER — Telehealth (HOSPITAL_COMMUNITY): Payer: Self-pay | Admitting: Physical Therapy

## 2020-04-24 NOTE — Telephone Encounter (Signed)
pt cancelled appt on 04/24/20 because he is out of town

## 2020-04-25 ENCOUNTER — Encounter (HOSPITAL_COMMUNITY): Payer: Medicare HMO | Admitting: Physical Therapy

## 2020-04-27 ENCOUNTER — Ambulatory Visit (HOSPITAL_COMMUNITY): Payer: Medicare HMO

## 2020-04-27 ENCOUNTER — Telehealth (HOSPITAL_COMMUNITY): Payer: Self-pay

## 2020-04-27 NOTE — Progress Notes (Deleted)
Patient did not show for his appointment today.   Floria Raveling. Hartnett-Rands, MS, PT Per [place] [phone] 04/27/2020

## 2020-04-30 ENCOUNTER — Encounter (HOSPITAL_COMMUNITY): Payer: Self-pay

## 2020-04-30 ENCOUNTER — Ambulatory Visit (HOSPITAL_COMMUNITY): Payer: Medicare HMO

## 2020-04-30 ENCOUNTER — Other Ambulatory Visit: Payer: Self-pay

## 2020-04-30 DIAGNOSIS — M545 Low back pain, unspecified: Secondary | ICD-10-CM

## 2020-04-30 DIAGNOSIS — R262 Difficulty in walking, not elsewhere classified: Secondary | ICD-10-CM

## 2020-04-30 DIAGNOSIS — M542 Cervicalgia: Secondary | ICD-10-CM | POA: Diagnosis not present

## 2020-04-30 NOTE — Patient Instructions (Addendum)
Knee to Chest: Gluteus Stretch With Roll    Bring one knee toward chest with towel roll behind knee. Hold _10__ seconds. Do 10___ times, each leg, 1-3_ times per day.  http://ss.exer.us/8   Copyright  VHI. All rights reserved.   Butterfly, Supine    Lie on back, feet together. Lower knees toward floor. Hold 5___ seconds. Repeat 10___ times per session. Do 1-3_ sessions per day.  Copyright  VHI. All rights reserved.   Axial Extension (Chin Tuck)    Pull chin in and lengthen back of neck. Hold _2-3___ seconds. Repeat _10___ times. Do _1-3___ sessions per day.  http://gt2.exer.us/449   Copyright  VHI. All rights reserved.
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
## 2020-05-02 ENCOUNTER — Other Ambulatory Visit: Payer: Self-pay

## 2020-05-02 ENCOUNTER — Ambulatory Visit (INDEPENDENT_AMBULATORY_CARE_PROVIDER_SITE_OTHER): Payer: Medicare HMO

## 2020-05-02 ENCOUNTER — Encounter: Payer: Self-pay | Admitting: Pulmonary Disease

## 2020-05-02 ENCOUNTER — Ambulatory Visit (INDEPENDENT_AMBULATORY_CARE_PROVIDER_SITE_OTHER): Payer: Medicare HMO | Admitting: Pulmonary Disease

## 2020-05-02 ENCOUNTER — Ambulatory Visit: Payer: Medicare HMO | Admitting: Pulmonary Disease

## 2020-05-02 VITALS — BP 122/80 | HR 100 | Temp 97.1°F | Ht 72.0 in | Wt 238.0 lb

## 2020-05-02 DIAGNOSIS — J418 Mixed simple and mucopurulent chronic bronchitis: Secondary | ICD-10-CM

## 2020-05-02 DIAGNOSIS — R05 Cough: Secondary | ICD-10-CM | POA: Diagnosis not present

## 2020-05-02 DIAGNOSIS — J0100 Acute maxillary sinusitis, unspecified: Secondary | ICD-10-CM | POA: Insufficient documentation

## 2020-05-02 DIAGNOSIS — R6889 Other general symptoms and signs: Secondary | ICD-10-CM | POA: Diagnosis not present

## 2020-05-02 DIAGNOSIS — J9611 Chronic respiratory failure with hypoxia: Secondary | ICD-10-CM | POA: Diagnosis not present

## 2020-05-02 IMAGING — DX DG CHEST 2V
2 series · 2 of 2 positions shown · non-contrast
Comparison: [DATE].

CLINICAL DATA: Cough.

EXAM:
CHEST - 2 VIEW

[chest pa]
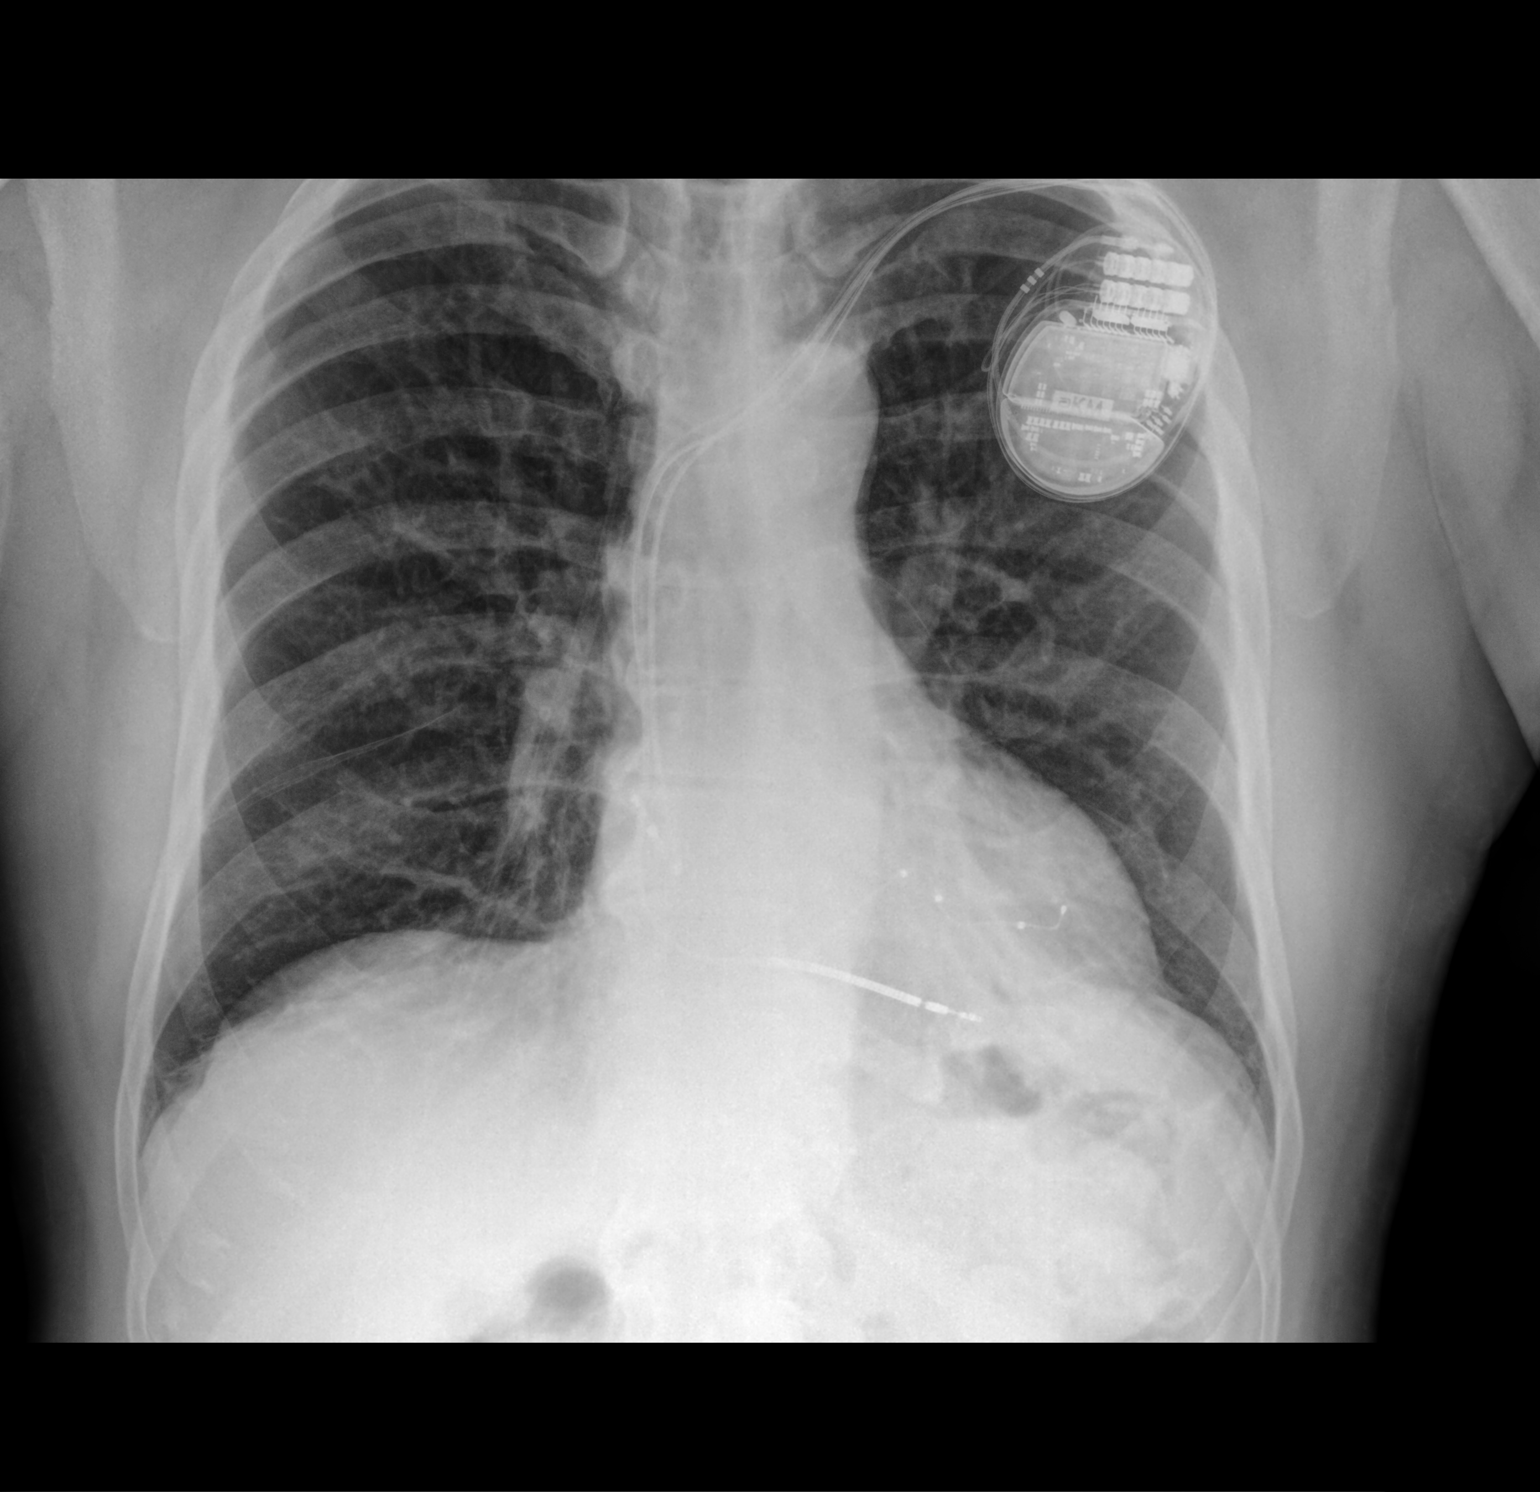

[chest lat]
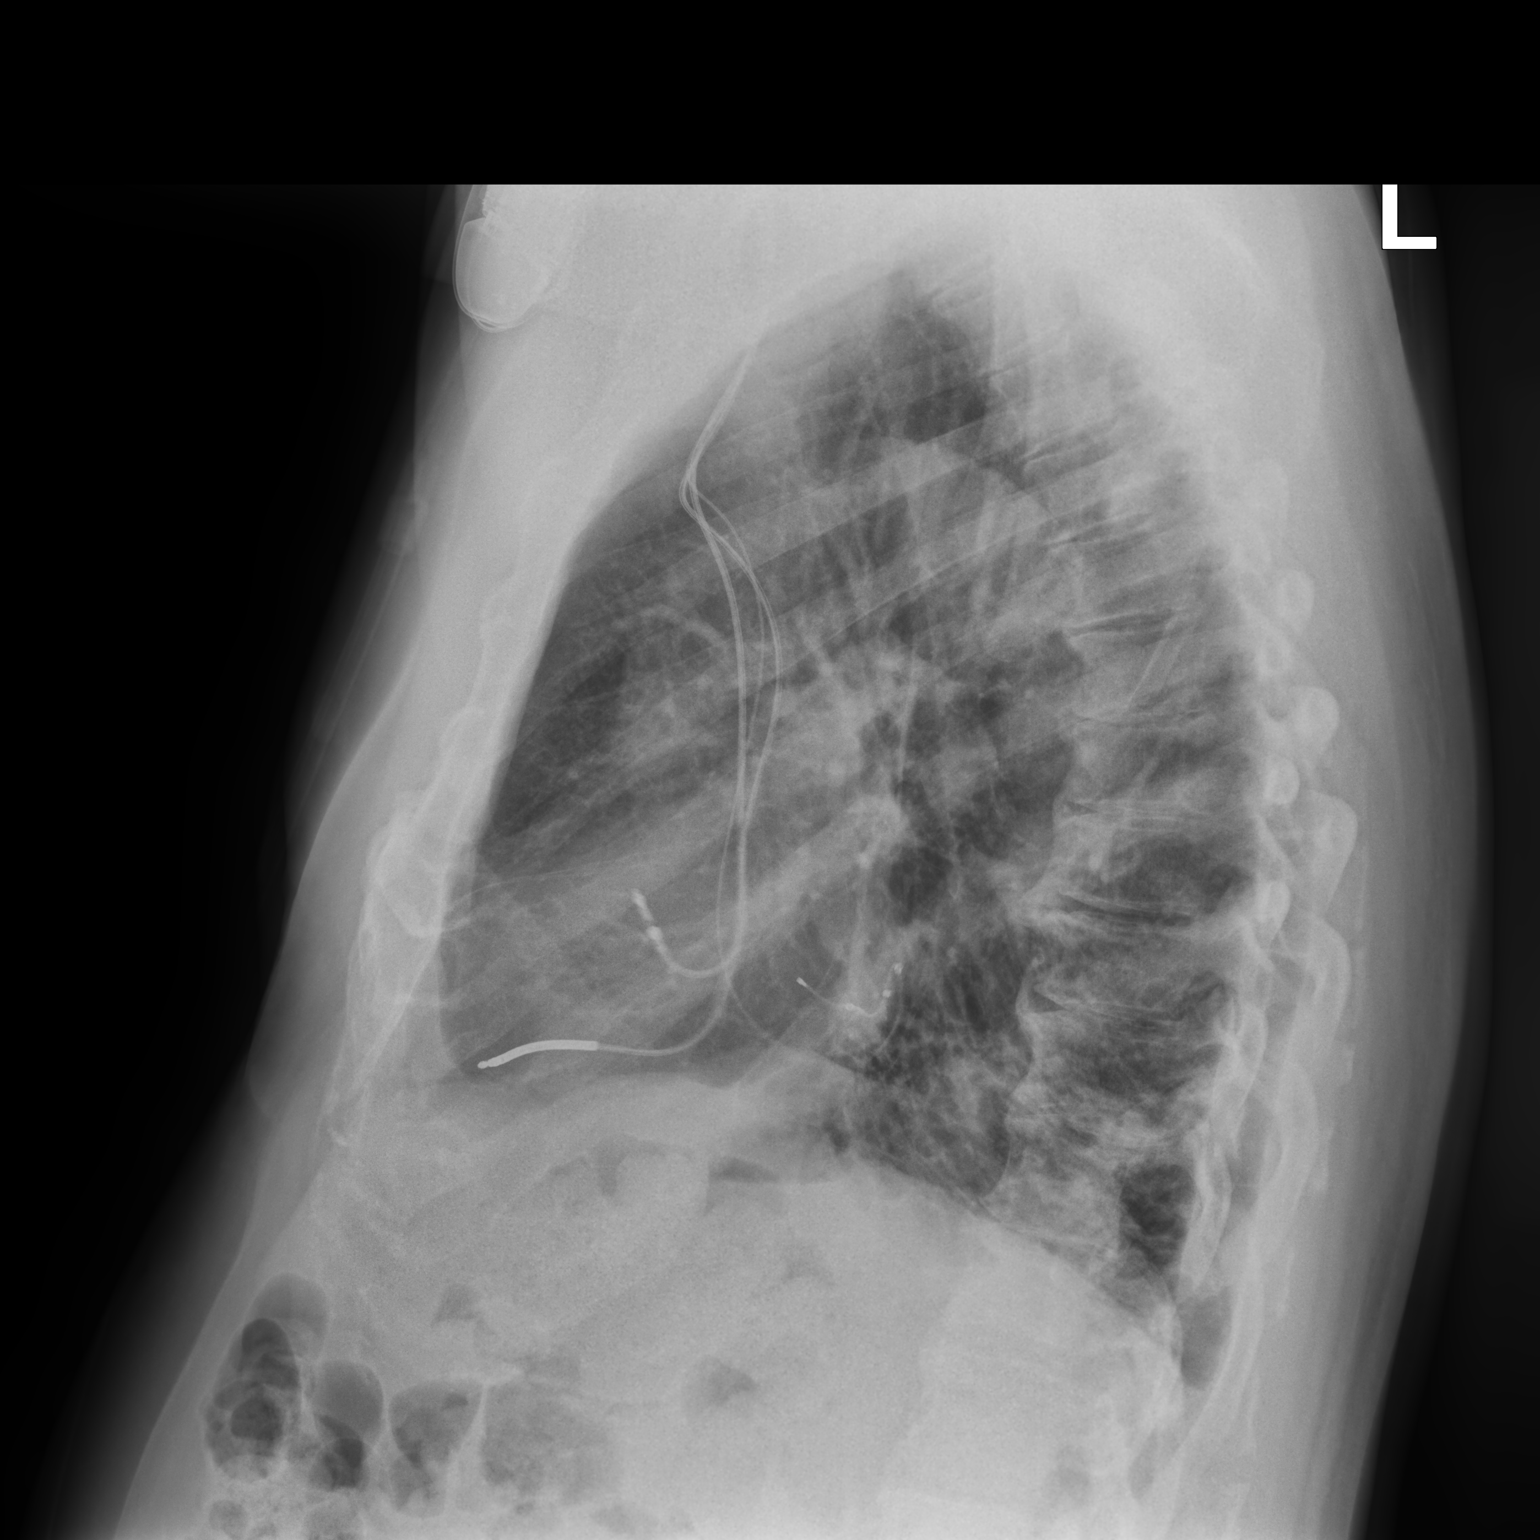

[2 of 2 positions shown; findings below may reference images not displayed]

FINDINGS: The heart size and mediastinal contours are within normal limits.
Both lungs are clear. No pneumothorax or pleural effusion is noted.
Left-sided pacemaker is unchanged in position. The visualized
skeletal structures are unremarkable.
IMPRESSION: No active cardiopulmonary disease.

## 2020-05-02 MED ORDER — PREDNISONE 10 MG PO TABS: ORAL_TABLET | ORAL | 0 refills | Status: DC

## 2020-05-02 MED ORDER — AMOXICILLIN-POT CLAVULANATE 875-125 MG PO TABS: 1.0000 | ORAL_TABLET | Freq: Two times a day (BID) | ORAL | 0 refills | Status: DC

## 2020-05-02 NOTE — Assessment & Plan Note (Signed)
Plan: Augmentin today Prednisone taper today Start nasal saline rinses Continue Flonase

## 2020-05-02 NOTE — Assessment & Plan Note (Addendum)
Suspected COPD exacerbation due to acute sinus infection at this point in time  Plan: Augmentin today Prednisone today Chest x-ray today Walk today in office We will order pulmonary function testing to be completed in 3 months Continue budesonide nebs Continue Brovana nebulized meds Continue Spiriva Respimat 2.5 Hold Monday Wednesday Friday azithromycin when taking Augmentin 4-week follow-up with Wyn Quaker, NP 75-month follow-up with Dr. Valeta Harms

## 2020-05-02 NOTE — Assessment & Plan Note (Signed)
Plan: Walk today in office -no oxygen desaturations on 2 L of O2 Continue oxygen therapy as prescribed
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
## 2020-05-02 NOTE — Patient Instructions (Addendum)
You were seen today by Lauraine Rinne, NP  for:   1. Acute maxillary sinusitis, recurrence not specified  - amoxicillin-clavulanate (AUGMENTIN) 875-125 MG tablet; Take 1 tablet by mouth 2 (two) times daily.  Dispense: 14 tablet; Refill: 0 - predniSONE (DELTASONE) 10 MG tablet; 4 tabs for 2 days, then 3 tabs for 2 days, 2 tabs for 2 days, then 1 tab for 2 days, then stop  Dispense: 20 tablet; Refill: 0  Start nasal saline rinses twice daily Use distilled water Shake well Get bottle lukewarm like a baby bottle   Continue flonase  >>> 1 spray each nostril daily for nasal congestion  2. 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HCC)  - Pulmonary function test; Future - DG Chest 2 View; Future - amoxicillin-clavulanate (AUGMENTIN) 875-125 MG tablet; Take 1 tablet by mouth 2 (two) times daily.  Dispense: 14 tablet; Refill: 0 - predniSONE (DELTASONE) 10 MG tablet; 4 tabs for 2 days, then 3 tabs for 2 days, 2 tabs for 2 days, then 1 tab for 2 days, then stop  Dispense: 20 tablet; Refill: 0  Augmentin >>> Take 1 875-125 mg tablet every 12 hours for the next 7 days >>> Take with food  Prednisone 10mg  tablet  >>>4 tabs for 2 days, then 3 tabs for 2 days, 2 tabs for 2 days, then 1 tab for 2 days, then stop >>>take with food  >>>take in the morning   Hold taking Monday Wednesday Friday azithromycin while taking Augmentin  We will order a pulmonary function test to further evaluate your breathing  Chest x-ray today  Continue budesonide nebs twice daily  Continue Brovana nebs twice daily  Spiriva Respimat 2.5 >>> 2 puffs daily >>> Do this every day >>>This is not a rescue inhaler   Note your daily symptoms > remember "red flags" for COPD:   >>>Increase in cough >>>increase in sputum production >>>increase in shortness of breath or activity  intolerance.   If you notice these symptoms, please call the office to be seen.    3. Chronic hypoxemic respiratory failure  (HCC)  Walk today in office   Continue oxygen therapy as prescribed  >>>maintain oxygen saturations greater than 88 percent  >>>if unable to maintain oxygen saturations please contact the office  >>>do not smoke with oxygen  >>>can use nasal saline gel or nasal saline rinses to moisturize nose if oxygen causes dryness    We recommend today:  Orders Placed This Encounter  Procedures  . DG Chest 2 View    Standing Status:   Future    Standing Expiration Date:   09/01/2020    Order Specific Question:   Reason for Exam (SYMPTOM  OR DIAGNOSIS REQUIRED)    Answer:   copd, cough    Order Specific Question:   Preferred imaging location?    Answer:   Internal    Order Specific Question:   Radiology Contrast Protocol - do NOT remove file path    Answer:   [email or web address]  . Pulmonary function test    Standing Status:   Future    Standing Expiration Date:   05/02/2021    Order Specific Question:   Where should this test be performed?    Answer:   [facility]    Order Specific Question:   Full PFT: includes the following: basic spirometry, spirometry pre & post bronchodilator, diffusion capacity (DLCO), lung volumes    Answer:   Full PFT   Orders Placed This Encounter  Procedures  .  DG Chest 2 View  . Pulmonary function test   Meds ordered this encounter  Medications  . amoxicillin-clavulanate (AUGMENTIN) 875-125 MG tablet    Sig: Take 1 tablet by mouth 2 (two) times daily.    Dispense:  14 tablet    Refill:  0  . predniSONE (DELTASONE) 10 MG tablet    Sig: 4 tabs for 2 days, then 3 tabs for 2 days, 2 tabs for 2 days, then 1 tab for 2 days, then stop    Dispense:  20 tablet    Refill:  0    Follow Up:    Return in about 4 weeks (around 05/30/2020), or if symptoms worsen or fail to improve, for Follow up with Wyn Quaker FNP-C, Follow up for FULL PFT - 60 min.  Schedule recall with Dr. Valeta Harms in 8 to 12 weeks with  follow-up and pulmonary function test         Please do your part to reduce the spread of COVID-19:      Reduce your risk of any infection  and COVID19 by using the similar precautions used for avoiding the common cold or flu:  [name] Wash your hands often with soap and warm water for at least 20 seconds.  If soap and water are not readily available, use an alcohol-based hand sanitizer with at least 60% alcohol.  . If coughing or sneezing, cover your mouth and nose by coughing or sneezing into the elbow areas of your shirt or coat, into a tissue or into your sleeve (not your hands). Langley Gauss A MASK when in public  . Avoid shaking hands with others and consider head nods or verbal greetings only. . Avoid touching your eyes, nose, or mouth with unwashed hands.  . Avoid close contact with people who are sick. . Avoid places or events with large numbers of people in one location, like concerts or sporting events. . If you have some symptoms but not all symptoms, continue to monitor at home and seek medical attention if your symptoms worsen. . If you are having a medical emergency, call 911.   [place] / e-Visit: [email or web address]         [facility]: [facility]: [phone]                   [facility]: [phone]     It is flu season:   >>> Best ways to protect herself from the flu: Receive the yearly flu vaccine, practice good hand hygiene washing with soap and also using hand sanitizer when available, eat a nutritious meals, get adequate rest, hydrate appropriately   Please contact the office if your symptoms worsen or you have concerns that you are not improving.   Thank you for choosing [facility] for your healthcare, and for allowing [place] to partner with you on your healthcare journey. I am thankful to be able  to provide care to you today.   Wyn Quaker FNP-C

## 2020-05-03 ENCOUNTER — Telehealth (HOSPITAL_COMMUNITY): Payer: Self-pay

## 2020-05-03 ENCOUNTER — Encounter (HOSPITAL_COMMUNITY): Payer: Medicare HMO

## 2020-05-03 NOTE — Telephone Encounter (Signed)
pt cancelled appt because transportation did not pick him up

## 2020-05-08 ENCOUNTER — Other Ambulatory Visit: Payer: Self-pay

## 2020-05-08 ENCOUNTER — Ambulatory Visit (HOSPITAL_COMMUNITY): Payer: Medicare HMO | Attending: Internal Medicine | Admitting: Physical Therapy

## 2020-05-08 DIAGNOSIS — R262 Difficulty in walking, not elsewhere classified: Secondary | ICD-10-CM | POA: Diagnosis not present

## 2020-05-08 DIAGNOSIS — M545 Low back pain, unspecified: Secondary | ICD-10-CM

## 2020-05-08 DIAGNOSIS — M542 Cervicalgia: Secondary | ICD-10-CM | POA: Insufficient documentation
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
## 2020-05-09 ENCOUNTER — Ambulatory Visit (INDEPENDENT_AMBULATORY_CARE_PROVIDER_SITE_OTHER): Payer: Medicare HMO | Admitting: *Deleted

## 2020-05-09 DIAGNOSIS — I428 Other cardiomyopathies: Secondary | ICD-10-CM | POA: Diagnosis not present

## 2020-05-09 LAB — CUP PACEART REMOTE DEVICE CHECK
Battery Remaining Longevity: 94 mo
Battery Remaining Percentage: 94 %
Battery Voltage: 2.98 V
Date Time Interrogation Session: 20210908020052
HighPow Impedance: 75 Ohm
Implantable Lead Implant Date: 20210308
Implantable Lead Implant Date: 20210308
Implantable Lead Implant Date: 20210308
Implantable Lead Location: 753858
Implantable Lead Location: 753859
Implantable Lead Location: 753860
Implantable Lead Model: 7122
Implantable Pulse Generator Implant Date: 20210308
Lead Channel Impedance Value: 440 Ohm
Lead Channel Impedance Value: 540 Ohm
Lead Channel Impedance Value: 810 Ohm
Lead Channel Pacing Threshold Amplitude: 0.5 V
Lead Channel Pacing Threshold Amplitude: 0.75 V
Lead Channel Pacing Threshold Amplitude: 1.25 V
Lead Channel Pacing Threshold Pulse Width: 0.5 ms
Lead Channel Pacing Threshold Pulse Width: 0.5 ms
Lead Channel Pacing Threshold Pulse Width: 0.5 ms
Lead Channel Sensing Intrinsic Amplitude: 11.8 mV
Lead Channel Sensing Intrinsic Amplitude: 4.8 mV
Lead Channel Setting Pacing Amplitude: 1.5 V
Lead Channel Setting Pacing Amplitude: 2 V
Lead Channel Setting Pacing Amplitude: 2.5 V
Lead Channel Setting Pacing Pulse Width: 0.5 ms
Lead Channel Setting Pacing Pulse Width: 0.5 ms
Lead Channel Setting Sensing Sensitivity: 0.5 mV
Pulse Gen Serial Number: 111019088

## 2020-05-10 ENCOUNTER — Encounter (HOSPITAL_COMMUNITY): Payer: Self-pay

## 2020-05-10 ENCOUNTER — Ambulatory Visit (HOSPITAL_COMMUNITY): Payer: Medicare HMO

## 2020-05-10 ENCOUNTER — Other Ambulatory Visit: Payer: Self-pay

## 2020-05-10 DIAGNOSIS — R262 Difficulty in walking, not elsewhere classified: Secondary | ICD-10-CM | POA: Diagnosis not present

## 2020-05-10 DIAGNOSIS — M545 Low back pain, unspecified: Secondary | ICD-10-CM

## 2020-05-10 DIAGNOSIS — M542 Cervicalgia: Secondary | ICD-10-CM

## 2020-05-10 NOTE — Progress Notes (Signed)
Remote ICD transmission.   
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
## 2020-05-14 ENCOUNTER — Encounter (HOSPITAL_COMMUNITY): Payer: Self-pay | Admitting: Physical Therapy

## 2020-05-14 ENCOUNTER — Ambulatory Visit (HOSPITAL_COMMUNITY): Payer: Medicare HMO | Admitting: Physical Therapy

## 2020-05-14 ENCOUNTER — Other Ambulatory Visit: Payer: Self-pay

## 2020-05-14 DIAGNOSIS — M542 Cervicalgia: Secondary | ICD-10-CM | POA: Diagnosis not present

## 2020-05-14 DIAGNOSIS — J9601 Acute respiratory failure with hypoxia: Secondary | ICD-10-CM | POA: Diagnosis not present

## 2020-05-14 DIAGNOSIS — R262 Difficulty in walking, not elsewhere classified: Secondary | ICD-10-CM

## 2020-05-14 DIAGNOSIS — R6889 Other general symptoms and signs: Secondary | ICD-10-CM | POA: Diagnosis not present

## 2020-05-14 DIAGNOSIS — M545 Low back pain, unspecified: Secondary | ICD-10-CM

## 2020-05-14 DIAGNOSIS — I5022 Chronic systolic (congestive) heart failure: Secondary | ICD-10-CM | POA: Diagnosis not present

## 2020-05-14 DIAGNOSIS — I5032 Chronic diastolic (congestive) heart failure: Secondary | ICD-10-CM | POA: Diagnosis not present
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
## 2020-05-16 ENCOUNTER — Other Ambulatory Visit: Payer: Self-pay

## 2020-05-16 ENCOUNTER — Encounter (HOSPITAL_COMMUNITY): Payer: Self-pay

## 2020-05-16 ENCOUNTER — Ambulatory Visit (HOSPITAL_COMMUNITY): Payer: Medicare HMO

## 2020-05-16 VITALS — HR 113

## 2020-05-16 DIAGNOSIS — I129 Hypertensive chronic kidney disease with stage 1 through stage 4 chronic kidney disease, or unspecified chronic kidney disease: Secondary | ICD-10-CM | POA: Diagnosis not present

## 2020-05-16 DIAGNOSIS — M545 Low back pain, unspecified: Secondary | ICD-10-CM

## 2020-05-16 DIAGNOSIS — E7849 Other hyperlipidemia: Secondary | ICD-10-CM | POA: Diagnosis not present

## 2020-05-16 DIAGNOSIS — M542 Cervicalgia: Secondary | ICD-10-CM

## 2020-05-16 DIAGNOSIS — R262 Difficulty in walking, not elsewhere classified: Secondary | ICD-10-CM

## 2020-05-16 DIAGNOSIS — N183 Chronic kidney disease, stage 3 unspecified: Secondary | ICD-10-CM | POA: Diagnosis not present

## 2020-05-16 DIAGNOSIS — R6889 Other general symptoms and signs: Secondary | ICD-10-CM | POA: Diagnosis not present

## 2020-05-16 DIAGNOSIS — E1122 Type 2 diabetes mellitus with diabetic chronic kidney disease: Secondary | ICD-10-CM | POA: Diagnosis not present
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
## 2020-05-17 DIAGNOSIS — I5022 Chronic systolic (congestive) heart failure: Secondary | ICD-10-CM | POA: Diagnosis not present

## 2020-05-17 DIAGNOSIS — R6889 Other general symptoms and signs: Secondary | ICD-10-CM | POA: Diagnosis not present

## 2020-05-17 DIAGNOSIS — J449 Chronic obstructive pulmonary disease, unspecified: Secondary | ICD-10-CM | POA: Diagnosis not present

## 2020-05-17 DIAGNOSIS — J9601 Acute respiratory failure with hypoxia: Secondary | ICD-10-CM | POA: Diagnosis not present

## 2020-05-22 ENCOUNTER — Telehealth (HOSPITAL_COMMUNITY): Payer: Self-pay | Admitting: Physical Therapy

## 2020-05-22 ENCOUNTER — Encounter (HOSPITAL_COMMUNITY): Payer: Medicare HMO | Admitting: Physical Therapy

## 2020-05-22 NOTE — Telephone Encounter (Signed)
Patient no show, called patient to check in with him but patient did not answer and was unable to leave message.  2:30 PM, 05/22/20 Mearl Latin PT, DPT Physical Therapist at [facility]

## 2020-05-23 ENCOUNTER — Telehealth: Payer: Self-pay

## 2020-05-23 DIAGNOSIS — Z79899 Other long term (current) drug therapy: Secondary | ICD-10-CM

## 2020-05-23 NOTE — Telephone Encounter (Signed)
[facility] alert received 05/22/20 for CorVue currently abnormal for the past 9 days (thoracic impedance below baseline). EMR Lasix 20 mg daily as needed.  Called patient to discuss s/s and medication compliance.   No answer, LMOVM.
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
## 2020-05-24 NOTE — Telephone Encounter (Signed)
The pt returning nurse call. He can be reached at [phone].

## 2020-05-24 NOTE — Telephone Encounter (Signed)
Called patient, brother says he is asleep, will call back

## 2020-05-24 NOTE — Telephone Encounter (Signed)
If patient taking lasix 20mg  daily please have him increase to 40mg  daily, update [place] on Monday of his swelling, needs bmet/mg/bnp in 1 week   Zandra Abts MD

## 2020-05-25 MED ORDER — FUROSEMIDE 40 MG PO TABS
40.0000 mg | ORAL_TABLET | Freq: Every day | ORAL | 3 refills | Status: DC
Start: 2020-05-25 — End: 2021-04-19

## 2020-05-25 NOTE — Telephone Encounter (Signed)
Pt agrees to increase lasix to 40 mg daily and call back with update.He will stop by office next week for lab slips

## 2020-05-26 ENCOUNTER — Other Ambulatory Visit: Payer: Self-pay | Admitting: Student

## 2020-05-28 ENCOUNTER — Ambulatory Visit (HOSPITAL_COMMUNITY): Payer: Medicare HMO

## 2020-05-28 ENCOUNTER — Encounter (HOSPITAL_COMMUNITY): Payer: Self-pay

## 2020-05-28 ENCOUNTER — Other Ambulatory Visit: Payer: Self-pay

## 2020-05-28 ENCOUNTER — Telehealth: Payer: Self-pay

## 2020-05-28 DIAGNOSIS — R262 Difficulty in walking, not elsewhere classified: Secondary | ICD-10-CM

## 2020-05-28 DIAGNOSIS — H6503 Acute serous otitis media, bilateral: Secondary | ICD-10-CM | POA: Diagnosis not present

## 2020-05-28 DIAGNOSIS — Z9981 Dependence on supplemental oxygen: Secondary | ICD-10-CM | POA: Diagnosis not present

## 2020-05-28 DIAGNOSIS — G894 Chronic pain syndrome: Secondary | ICD-10-CM | POA: Diagnosis not present

## 2020-05-28 DIAGNOSIS — R945 Abnormal results of liver function studies: Secondary | ICD-10-CM | POA: Diagnosis not present

## 2020-05-28 DIAGNOSIS — H6122 Impacted cerumen, left ear: Secondary | ICD-10-CM | POA: Diagnosis not present

## 2020-05-28 DIAGNOSIS — E1122 Type 2 diabetes mellitus with diabetic chronic kidney disease: Secondary | ICD-10-CM | POA: Diagnosis not present

## 2020-05-28 DIAGNOSIS — M545 Low back pain, unspecified: Secondary | ICD-10-CM

## 2020-05-28 DIAGNOSIS — M542 Cervicalgia: Secondary | ICD-10-CM | POA: Diagnosis not present

## 2020-05-28 DIAGNOSIS — Z0001 Encounter for general adult medical examination with abnormal findings: Secondary | ICD-10-CM | POA: Diagnosis not present

## 2020-05-28 DIAGNOSIS — Z0189 Encounter for other specified special examinations: Secondary | ICD-10-CM | POA: Diagnosis not present

## 2020-05-28 NOTE — Telephone Encounter (Signed)
Received call back from brother, per DPR and advised reason for call.  Pt currently sleeping and he tends to stay up at night and sleep some during the day.  Best time to reach him is between 9-10 AM.  Advised if patient knows how to send many remote transmission to have him send one tonight and will call pt back in the AM.

## 2020-05-28 NOTE — Telephone Encounter (Signed)
This is Dr. Nelly Laurence pt

## 2020-05-28 NOTE — Telephone Encounter (Signed)
Referred to ICM clinic by Roma Kayser, Device RN.  Attempted call to patient for ICM intro and no answer or voice mail.  Dr Lovena Le is EP physician and Dr Harl Bowie is primary cardiologist.  Pt contacted on 9/22 by device RN due to abnormal CorVue and Dr Harl Bowie recommended Lasix increase on 05/23/2020. Will have patient send new remote transmission to recheck fluid levels when reached.  Pt has [facility] mobile.
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
## 2020-05-29 NOTE — Telephone Encounter (Signed)
Attempted call to patient and no answer.

## 2020-05-29 NOTE — Telephone Encounter (Signed)
Attempted call to patient and no answer or option to leave voice mail message.  Received remote transmission and will send copy to Dr Harl Bowie for review.  Lasix was increased to 40 mg daily on 05/25/2020 due to shortness of breath and bilateral leg swelling.   Unable to reach patient to determine if symptoms have resolved and to provide ICM referral intro call.    05/29/2020  [facility]ue thoracic impedance trending above baseline suggesting possible dryness starting 05/26/2020.  Impedance suggesting possible fluid accumulation from 05/12/2011 to 05/24/2020.  Recheck fluid level on 06/05/2020    Copy sent to Dr Harl Bowie and his nurse, Barbarann Ehlers, RN for review and update.

## 2020-05-30 ENCOUNTER — Other Ambulatory Visit: Payer: Self-pay

## 2020-05-30 ENCOUNTER — Ambulatory Visit (HOSPITAL_COMMUNITY): Payer: Medicare HMO | Admitting: Physical Therapy

## 2020-05-30 ENCOUNTER — Encounter (HOSPITAL_COMMUNITY): Payer: Self-pay | Admitting: Physical Therapy

## 2020-05-30 DIAGNOSIS — M542 Cervicalgia: Secondary | ICD-10-CM | POA: Diagnosis not present

## 2020-05-30 DIAGNOSIS — R262 Difficulty in walking, not elsewhere classified: Secondary | ICD-10-CM

## 2020-05-30 DIAGNOSIS — M545 Low back pain, unspecified: Secondary | ICD-10-CM

## 2020-05-30 NOTE — Patient Instructions (Signed)
Access Code: [email or web address] URL: [email or web address] Date: 05/30/2020 Prepared by: Josue Hector  Exercises Sit to Stand without Arm Support - 2 x daily - 7 x weekly - 2 sets - 10 reps Standing Heel Raise with Support - 2 x daily - 7 x weekly - 2 sets - 10 reps Standing Row with Anchored Resistance - 2 x daily - 7 x weekly - 2 sets - 10 reps Shoulder Extension with Resistance - 2 x daily - 7 x weekly - 2 sets - 10 reps
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
## 2020-05-31 DIAGNOSIS — I2510 Atherosclerotic heart disease of native coronary artery without angina pectoris: Secondary | ICD-10-CM | POA: Diagnosis not present

## 2020-05-31 DIAGNOSIS — E782 Mixed hyperlipidemia: Secondary | ICD-10-CM | POA: Diagnosis not present

## 2020-05-31 DIAGNOSIS — K219 Gastro-esophageal reflux disease without esophagitis: Secondary | ICD-10-CM | POA: Diagnosis not present

## 2020-05-31 DIAGNOSIS — G894 Chronic pain syndrome: Secondary | ICD-10-CM | POA: Diagnosis not present

## 2020-05-31 DIAGNOSIS — I10 Essential (primary) hypertension: Secondary | ICD-10-CM | POA: Diagnosis not present

## 2020-05-31 DIAGNOSIS — G43009 Migraine without aura, not intractable, without status migrainosus: Secondary | ICD-10-CM | POA: Diagnosis not present

## 2020-05-31 DIAGNOSIS — E1165 Type 2 diabetes mellitus with hyperglycemia: Secondary | ICD-10-CM | POA: Diagnosis not present

## 2020-05-31 DIAGNOSIS — Z8701 Personal history of pneumonia (recurrent): Secondary | ICD-10-CM | POA: Diagnosis not present

## 2020-05-31 DIAGNOSIS — M109 Gout, unspecified: Secondary | ICD-10-CM | POA: Diagnosis not present

## 2020-06-01 ENCOUNTER — Other Ambulatory Visit (HOSPITAL_COMMUNITY)
Admission: RE | Admit: 2020-06-01 | Discharge: 2020-06-01 | Disposition: A | Discharge status: Home / Self Care | Payer: Medicare HMO | Source: Ambulatory Visit | Attending: Cardiology | Admitting: Cardiology

## 2020-06-01 DIAGNOSIS — Z79899 Other long term (current) drug therapy: Secondary | ICD-10-CM | POA: Diagnosis not present

## 2020-06-01 LAB — BASIC METABOLIC PANEL
Anion gap: 9 (ref 5–15)
BUN: 15 mg/dL (ref 8–23)
CO2: 25 mmol/L (ref 22–32)
Calcium: 9.5 mg/dL (ref 8.9–10.3)
Chloride: 102 mmol/L (ref 98–111)
Creatinine, Ser: 1.28 mg/dL — ABNORMAL HIGH (ref 0.61–1.24)
GFR calc Af Amer: >60 mL/min (ref >60)
GFR calc non Af Amer: 56 mL/min — ABNORMAL LOW (ref >60)
Glucose, Bld: 93 mg/dL (ref 70–99)
Potassium: 3.9 mmol/L (ref 3.5–5.1)
Sodium: 136 mmol/L (ref 135–145)

## 2020-06-01 LAB — MAGNESIUM: Magnesium: 2.1 mg/dL (ref 1.7–2.4)

## 2020-06-01 LAB — BRAIN NATRIURETIC PEPTIDE: B Natriuretic Peptide: 38 pg/mL (ref 0.0–100.0)

## 2020-06-04 ENCOUNTER — Other Ambulatory Visit: Payer: Self-pay

## 2020-06-04 ENCOUNTER — Ambulatory Visit (HOSPITAL_COMMUNITY): Payer: Medicare HMO | Attending: Internal Medicine

## 2020-06-04 DIAGNOSIS — R262 Difficulty in walking, not elsewhere classified: Secondary | ICD-10-CM | POA: Insufficient documentation

## 2020-06-04 DIAGNOSIS — M545 Low back pain, unspecified: Secondary | ICD-10-CM | POA: Insufficient documentation

## 2020-06-04 DIAGNOSIS — M542 Cervicalgia: Secondary | ICD-10-CM | POA: Diagnosis not present
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
## 2020-06-05 ENCOUNTER — Ambulatory Visit (INDEPENDENT_AMBULATORY_CARE_PROVIDER_SITE_OTHER): Payer: Medicare HMO | Admitting: Pulmonary Disease

## 2020-06-05 ENCOUNTER — Encounter: Payer: Self-pay | Admitting: Pulmonary Disease

## 2020-06-05 ENCOUNTER — Ambulatory Visit (INDEPENDENT_AMBULATORY_CARE_PROVIDER_SITE_OTHER): Payer: Medicare HMO

## 2020-06-05 VITALS — BP 120/82 | HR 100 | Temp 97.6°F | Ht 74.0 in | Wt 232.4 lb

## 2020-06-05 DIAGNOSIS — I5022 Chronic systolic (congestive) heart failure: Secondary | ICD-10-CM

## 2020-06-05 DIAGNOSIS — J418 Mixed simple and mucopurulent chronic bronchitis: Secondary | ICD-10-CM

## 2020-06-05 DIAGNOSIS — J9611 Chronic respiratory failure with hypoxia: Secondary | ICD-10-CM

## 2020-06-05 DIAGNOSIS — Z0000 Encounter for general adult medical examination without abnormal findings: Secondary | ICD-10-CM | POA: Insufficient documentation

## 2020-06-05 DIAGNOSIS — Z95810 Presence of automatic (implantable) cardiac defibrillator: Secondary | ICD-10-CM

## 2020-06-05 LAB — PULMONARY FUNCTION TEST
DL/VA % pred: 69 %
DL/VA: 2.75 ml/min/mmHg/L
DLCO cor % pred: 52 %
DLCO cor: 14.31 ml/min/mmHg
DLCO unc % pred: 52 %
DLCO unc: 14.31 ml/min/mmHg
FEF 25-75 Post: 1.47 L/sec
FEF 25-75 Pre: 1.31 L/sec
FEF2575-%Change-Post: 11 %
FEF2575-%Pred-Post: 56 %
FEF2575-%Pred-Pre: 50 %
FEV1-%Change-Post: 3 %
FEV1-%Pred-Post: 73 %
FEV1-%Pred-Pre: 70 %
FEV1-Post: 2.27 L
FEV1-Pre: 2.19 L
FEV1FVC-%Change-Post: 3 %
FEV1FVC-%Pred-Pre: 88 %
FEV6-%Change-Post: 0 %
FEV6-%Pred-Post: 81 %
FEV6-%Pred-Pre: 82 %
FEV6-Post: 3.2 L
FEV6-Pre: 3.22 L
FEV6FVC-%Change-Post: 0 %
FEV6FVC-%Pred-Post: 102 %
FEV6FVC-%Pred-Pre: 103 %
FVC-%Change-Post: 0 %
FVC-%Pred-Post: 79 %
FVC-%Pred-Pre: 79 %
FVC-Post: 3.25 L
FVC-Pre: 3.26 L
Post FEV1/FVC ratio: 70 %
Post FEV6/FVC ratio: 98 %
Pre FEV1/FVC ratio: 67 %
Pre FEV6/FVC Ratio: 99 %
RV % pred: 91 %
RV: 2.38 L
TLC % pred: 78 %
TLC: 5.84 L

## 2020-06-05 MED ORDER — LEVOFLOXACIN 500 MG PO TABS: 500.0000 mg | ORAL_TABLET | Freq: Every day | ORAL | 0 refills | Status: DC

## 2020-06-05 NOTE — Patient Instructions (Addendum)
You were seen today by Lauraine Rinne, NP  for:   1. 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[place])  - Respiratory or Resp and Sputum Culture; Future - AFB Culture & Smear; Future - Culture, fungus without smear; Future - levofloxacin (LEVAQUIN) 500 MG tablet; Take 1 tablet (500 mg total) by mouth daily.  Dispense: 7 tablet; Refill: 0  Levaquin 500mg  tablet >>> Take 1 500 mg tablet daily for the next 7 days >>> Take with food >>> start probiotic for good gut health >>>avoid rigorous exercise for next 2 weeks   Hold Monday Wednesday Friday azithromycin when taking Levaquin  Sputum cultures requested today  Continue Brovana nebulized meds twice daily Continue Pulmicort nebulized meds twice daily  Spiriva Respimat 2.5 >>> 2 puffs daily >>> Do this every day >>>This is not a rescue inhaler   After finishing Levaquin okay to resume Monday Wednesday Friday azithromycin 250 mg  2. Chronic hypoxemic respiratory failure (HCC)  Continue oxygen therapy as prescribed  >>>maintain oxygen saturations greater than 88 percent  >>>if unable to maintain oxygen saturations please contact the office  >>>do not smoke with oxygen  >>>can use nasal saline gel or nasal saline rinses to moisturize nose if oxygen causes dryness   3. Chronic systolic heart failure (HCC)  Continue your fluid pills  Continue to weigh yourself regularly  Keep follow-up with cardiology and primary care  4. Healthcare maintenance  We will update our records to reflect your COVID-19 vaccinations as well as the booster  We recommend today:  Orders Placed This Encounter  Procedures  . Respiratory or Resp and Sputum Culture    Standing Status:   Future    Standing Expiration Date:   06/05/2021  . AFB Culture & Smear    Standing Status:   Future    Standing Expiration Date:   06/05/2021  . Culture, fungus without smear    Standing Status:   Future    Standing Expiration Date:   06/05/2021   Orders  Placed This Encounter  Procedures  . Respiratory or Resp and Sputum Culture  . AFB Culture & Smear  . Culture, fungus without smear   Meds ordered this encounter  Medications  . levofloxacin (LEVAQUIN) 500 MG tablet    Sig: Take 1 tablet (500 mg total) by mouth daily.    Dispense:  7 tablet    Refill:  0    Follow Up:    Return in about 6 weeks (around 07/17/2020), or if symptoms worsen or fail to improve, for Follow up with Dr. Valeta Harms. If nothing available at 6 weeks with Dr. Valeta Harms okay to schedule out to 8 weeks.  Patient needs to see Dr. Valeta Harms.  Notification of test results are managed in the following manner: If there are  any recommendations or changes to the  plan of care discussed in office today,  we will contact you and let you know what they are. If you do not hear from [place], then your results are normal and you can view them through your  MyChart account , or a letter will be sent to you. Thank you again for trusting [place] with your care  - Thank you, [facility]    It is flu season:   >>> Best ways to protect herself from the flu: Receive the yearly flu vaccine, practice good hand hygiene washing with soap and also using hand sanitizer when available, eat a nutritious meals, get adequate rest, hydrate appropriately  Please contact the office if your symptoms worsen or you have concerns that you are not improving.   Thank you for choosing [facility] for your healthcare, and for allowing [place] to partner with you on your healthcare journey. I am thankful to be able to provide care to you today.   Wyn Quaker FNP-C

## 2020-06-05 NOTE — Progress Notes (Signed)
EPIC Encounter for ICM Monitoring  Patient Name: Roger Lowe. is a 74 y.o. male Date: 06/05/2020 Primary Care Physican: [name], MD Primary Cardiologist: Branch Electrophysiologist: Lovena Le 06/05/2020 Weight: 232 lbs (baseline for last 9 months)       1st ICM remote transmission.  Provided ICM intro and he agreed to monthly ICM follow up.    Currently asymptomatic for dryness or fluid accumulation.  Discussed fluid and salt intake.  Patient has been drinking a lot of V8 juice and was unaware it was very high in salt.  He does drink coffee, tea and water.  Advised over the next couple of days to avoid drinking V8 juice and to increase other fluids since report suggesting dryness.   CorVue thoracic impedance suggesting possible dryness starting 05/25/2020 (Furosemide dosage increased 9/24).  CorVue impedance suggesting possible fluid accumulation 05/11/20 - 05/24/20.   Prescribed: Furosemide 40 mg take 1 tablet daily (increased 05/25/20 due to fluid accumulation)  Labs: 06/01/2020 Creatinine 1.28, BUN 15, Potassium 3.9, Sodium 136, GFR 56->60 03/20/2020 Creatinine 1.17, BUN 6,   Potassium 3.9, Sodium 139, GFR >60  02/16/2020 Creatinine 1.23, BUN 12, Potassium 4.2, Sodium 137, GFR 58->60  A complete set of results can be found in Results Review.  Recommendations: Advised to increased fluid intake for the next couple of days.  Follow-up plan: ICM clinic phone appointment on 06/11/2020 (manual) to recheck fluid levels.   91 day device clinic remote transmission 08/08/2020.    EP/Cardiology Office Visits:  Due for 4 month follow up with Dr Harl Bowie 08/2020 (no recall).   Recall 02/15/2021 with Dr. Lovena Le.    Copy of ICM check sent to Dr. Lovena Le and Dr Harl Bowie for review andr commendations if needed.   3 month ICM trend: 06/05/2020    1 Year ICM trend:       [name], RN 06/05/2020 2:58 PM

## 2020-06-05 NOTE — Assessment & Plan Note (Signed)
Plan: Keep follow-up with primary care and cardiology Continue medications as managed Continue to weigh yourself regularly and ensure weights are stable If weights are increasing or lower extremity swelling is occurring please contact cardiology
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
## 2020-06-05 NOTE — Progress Notes (Signed)
PFT done today. 

## 2020-06-05 NOTE — Assessment & Plan Note (Signed)
continue oxygen therapy

## 2020-06-05 NOTE — Progress Notes (Signed)
PCCM:  Thanks for seeing him  Garner Nash, DO [facility] 06/05/2020 4:10 PM

## 2020-06-05 NOTE — Assessment & Plan Note (Signed)
Plan: Levaquin today Sputum cultures three-way's Reviewed pulmonary function testing Continue budesonide nebs twice daily Continue Brovana nebulized meds Continue Spiriva Respimat 2.5 Resume Monday Wednesday Friday azithromycin after taking Levaquin Schedule follow-up with Dr. Valeta Harms

## 2020-06-06 ENCOUNTER — Ambulatory Visit (HOSPITAL_COMMUNITY): Payer: Medicare HMO | Admitting: Physical Therapy

## 2020-06-08 ENCOUNTER — Telehealth: Payer: Self-pay | Admitting: *Deleted

## 2020-06-08 NOTE — Telephone Encounter (Signed)
Pt aware.

## 2020-06-08 NOTE — Telephone Encounter (Signed)
-----   Message from Arnoldo Lenis, MD sent at 06/06/2020  4:03 PM EDT ----- Labs look fine   Zandra Abts MD

## 2020-06-11 ENCOUNTER — Other Ambulatory Visit: Payer: Self-pay

## 2020-06-11 ENCOUNTER — Ambulatory Visit (INDEPENDENT_AMBULATORY_CARE_PROVIDER_SITE_OTHER): Payer: Medicare HMO

## 2020-06-11 ENCOUNTER — Ambulatory Visit (HOSPITAL_COMMUNITY): Payer: Medicare HMO | Admitting: Physical Therapy

## 2020-06-11 ENCOUNTER — Encounter (HOSPITAL_COMMUNITY): Payer: Self-pay | Admitting: Physical Therapy

## 2020-06-11 ENCOUNTER — Telehealth: Payer: Self-pay

## 2020-06-11 DIAGNOSIS — M545 Low back pain, unspecified: Secondary | ICD-10-CM | POA: Diagnosis not present

## 2020-06-11 DIAGNOSIS — M542 Cervicalgia: Secondary | ICD-10-CM

## 2020-06-11 DIAGNOSIS — I5022 Chronic systolic (congestive) heart failure: Secondary | ICD-10-CM

## 2020-06-11 DIAGNOSIS — Z95810 Presence of automatic (implantable) cardiac defibrillator: Secondary | ICD-10-CM

## 2020-06-11 DIAGNOSIS — R262 Difficulty in walking, not elsewhere classified: Secondary | ICD-10-CM

## 2020-06-11 NOTE — Telephone Encounter (Signed)
Called patient to see if patient could send a manual transmission. Unable to view diagnostics.   Manual transmission received, still showing "Diagnostics were invalid". Reached out to [place], [employer] rep. Waiting to hear back.
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
## 2020-06-11 NOTE — Progress Notes (Signed)
EPIC Encounter for ICM Monitoring  Patient Name: Roger Lowe. is a 74 y.o. male Date: 06/11/2020 Primary Care Physican: [name], MD Primary Cardiologist: Branch Electrophysiologist: Lovena Le 06/05/2020 Weight: 232 lbs (baseline for last 9 months)                                                           Spoke with patient and reports feeling well at this time.  Denies fluid symptoms.  He stated he is feeling fine. He has stopped drinking V8 juice as discussed at previous ICM call.     CorVue thoracic impedance suggesting fluid levels return to baseline normal today, 06/11/2020.    Prescribed: Furosemide 40 mg take 1 tablet daily (increased 05/25/20 due to fluid accumulation)  Labs: 06/01/2020 Creatinine 1.28, BUN 15, Potassium 3.9, Sodium 136, GFR 56->60 03/20/2020 Creatinine 1.17, BUN 6,   Potassium 3.9, Sodium 139, GFR >60  02/16/2020 Creatinine 1.23, BUN 12, Potassium 4.2, Sodium 137, GFR 58->60  A complete set of results can be found in Results Review.  Recommendations:  Recommendation to limit salt intake to 2000 mg daily and fluid intake to 64 oz daily.  Encouraged to call if experiencing any fluid symptoms.   Follow-up plan: ICM clinic phone appointment on 07/02/2020 to recheck fluid levels.   91 day device clinic remote transmission 08/08/2020.    EP/Cardiology Office Visits:  Due for 4 month follow up with Dr Harl Bowie 08/2020 (no recall).   Recall 02/15/2021 with Dr. Lovena Le.    Copy of ICM check sent to Dr. Lovena Le and Dr Harl Bowie for review.   3 month ICM trend: 06/11/2020    1 Year ICM trend:       [name], RN 06/11/2020 3:32 PM

## 2020-06-12 NOTE — Progress Notes (Signed)
Sorry I was out the last few days, I see you rechecked him and he looks good   Zandra Abts MD

## 2020-06-12 NOTE — Telephone Encounter (Signed)
Dionne Milo from [place] states patient was in back up mode post MRI, and was reprogrammed out of it a few months ago. Per tech services, after being in a backup mode, diagnostics data won't be given through [employer] and only able to be pulled from a programmer. There is a software fix coming for this but not sure when it will be available. So in order to get diagnostics and see episodes he will be to be brought in for now.   However, I do see a CorVue from the manual transmission on 06/11/20.

## 2020-06-13 ENCOUNTER — Ambulatory Visit (HOSPITAL_COMMUNITY): Payer: Medicare HMO | Admitting: Physical Therapy

## 2020-06-13 ENCOUNTER — Telehealth (HOSPITAL_COMMUNITY): Payer: Self-pay | Admitting: Physical Therapy

## 2020-06-13 DIAGNOSIS — I5032 Chronic diastolic (congestive) heart failure: Secondary | ICD-10-CM | POA: Diagnosis not present

## 2020-06-13 DIAGNOSIS — I5022 Chronic systolic (congestive) heart failure: Secondary | ICD-10-CM | POA: Diagnosis not present

## 2020-06-13 DIAGNOSIS — J9601 Acute respiratory failure with hypoxia: Secondary | ICD-10-CM | POA: Diagnosis not present

## 2020-06-13 NOTE — Telephone Encounter (Signed)
Patient called stating he can not come in today and wants to r/s for another date

## 2020-06-15 ENCOUNTER — Other Ambulatory Visit: Payer: Self-pay

## 2020-06-15 ENCOUNTER — Other Ambulatory Visit (HOSPITAL_COMMUNITY)
Admission: RE | Admit: 2020-06-15 | Discharge: 2020-06-15 | Disposition: A | Discharge status: Home / Self Care | Payer: Medicare HMO | Source: Ambulatory Visit | Attending: Pulmonary Disease | Admitting: Pulmonary Disease

## 2020-06-15 DIAGNOSIS — J418 Mixed simple and mucopurulent chronic bronchitis: Secondary | ICD-10-CM | POA: Diagnosis not present

## 2020-06-17 LAB — ACID FAST SMEAR (AFB, MYCOBACTERIA): Acid Fast Smear: NEGATIVE

## 2020-06-20 ENCOUNTER — Ambulatory Visit (HOSPITAL_COMMUNITY): Payer: Medicare HMO | Admitting: Physical Therapy

## 2020-06-21 LAB — CULTURE, RESPIRATORY W GRAM STAIN: Gram Stain: NONE SEEN

## 2020-06-26 ENCOUNTER — Ambulatory Visit: Payer: Medicare HMO | Admitting: Urology

## 2020-06-26 ENCOUNTER — Ambulatory Visit (INDEPENDENT_AMBULATORY_CARE_PROVIDER_SITE_OTHER): Payer: Medicare HMO | Admitting: Urology

## 2020-06-26 ENCOUNTER — Other Ambulatory Visit: Payer: Self-pay

## 2020-06-26 ENCOUNTER — Encounter: Payer: Self-pay | Admitting: Urology

## 2020-06-26 VITALS — BP 146/82 | HR 108 | Temp 98.5°F | Ht 74.0 in | Wt 232.0 lb

## 2020-06-26 DIAGNOSIS — R972 Elevated prostate specific antigen [PSA]: Secondary | ICD-10-CM

## 2020-06-26 DIAGNOSIS — R3915 Urgency of urination: Secondary | ICD-10-CM

## 2020-06-26 DIAGNOSIS — N3281 Overactive bladder: Secondary | ICD-10-CM

## 2020-06-26 DIAGNOSIS — N401 Enlarged prostate with lower urinary tract symptoms: Secondary | ICD-10-CM

## 2020-06-26 DIAGNOSIS — N138 Other obstructive and reflux uropathy: Secondary | ICD-10-CM

## 2020-06-26 LAB — URINALYSIS, ROUTINE W REFLEX MICROSCOPIC
Bilirubin, UA: NEGATIVE
Glucose, UA: NEGATIVE
Leukocytes,UA: NEGATIVE
Nitrite, UA: NEGATIVE
RBC, UA: NEGATIVE
Specific Gravity, UA: 1.025 (ref 1.005–1.030)
Urobilinogen, Ur: 0.2 mg/dL (ref 0.2–1.0)
pH, UA: 5.5 (ref 5.0–7.5)

## 2020-06-26 LAB — MICROSCOPIC EXAMINATION
Bacteria, UA: NONE SEEN
Epithelial Cells (non renal): NONE SEEN /hpf (ref 0–10)
RBC, Urine: NONE SEEN /hpf (ref 0–2)
Renal Epithel, UA: NONE SEEN /hpf
WBC, UA: NONE SEEN /hpf (ref 0–5)
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
## 2020-06-26 NOTE — Progress Notes (Signed)
Urological Symptom Review  Patient is experiencing the following symptoms: None   Review of Systems None  Gastrointestinal (upper)  : Negative for upper GI symptoms  Gastrointestinal (lower) : Negative for lower GI symptoms  Constitutional : Negative for symptoms  Skin: Negative for skin symptoms  Eyes: Blurred vision  Ear/Nose/Throat : Negative for Ear/Nose/Throat symptoms  Hematologic/Lymphatic: Negative for Hematologic/Lymphatic symptoms  Cardiovascular : Chest pain  Respiratory : Shortness of breath  Endocrine: Negative for endocrine symptoms  Musculoskeletal: Back pain Joint pain  Neurological: Negative for neurological symptoms  Psychologic: Negative for psychiatric symptoms

## 2020-06-27 LAB — FPSA% REFLEX
% FREE PSA: 26.1 %
PSA, FREE: 1.59 ng/mL

## 2020-06-27 LAB — PSA TOTAL (REFLEX TO FREE): Prostate Specific Ag, Serum: 6.1 ng/mL — ABNORMAL HIGH (ref 0.0–4.0)

## 2020-06-28 ENCOUNTER — Encounter: Payer: Self-pay | Admitting: Cardiology

## 2020-06-28 ENCOUNTER — Other Ambulatory Visit: Payer: Self-pay | Admitting: Urology

## 2020-06-28 DIAGNOSIS — R972 Elevated prostate specific antigen [PSA]: Secondary | ICD-10-CM

## 2020-06-28 DIAGNOSIS — E7849 Other hyperlipidemia: Secondary | ICD-10-CM | POA: Diagnosis not present

## 2020-06-28 DIAGNOSIS — N183 Chronic kidney disease, stage 3 unspecified: Secondary | ICD-10-CM | POA: Diagnosis not present

## 2020-06-28 DIAGNOSIS — E1122 Type 2 diabetes mellitus with diabetic chronic kidney disease: Secondary | ICD-10-CM | POA: Diagnosis not present

## 2020-06-28 DIAGNOSIS — I129 Hypertensive chronic kidney disease with stage 1 through stage 4 chronic kidney disease, or unspecified chronic kidney disease: Secondary | ICD-10-CM | POA: Diagnosis not present

## 2020-06-28 MED ORDER — LEVOFLOXACIN 750 MG PO TABS: 750.0000 mg | ORAL_TABLET | Freq: Every day | ORAL | 0 refills | Status: AC

## 2020-06-29 ENCOUNTER — Other Ambulatory Visit: Payer: Self-pay | Admitting: *Deleted

## 2020-06-29 NOTE — Telephone Encounter (Signed)
Received a fax from [facility] requesting refills for Azithromycin 250mg  M-W-F.  Per ov note, he is to continue on the Azithromycin 250 mg M-W-F except while taking the Levaquin.  Spoke with [employer] and provided verbal request for script with 2 refills.  Nothing further needed.

## 2020-07-02 ENCOUNTER — Ambulatory Visit (INDEPENDENT_AMBULATORY_CARE_PROVIDER_SITE_OTHER): Payer: Medicare HMO

## 2020-07-02 ENCOUNTER — Encounter: Payer: Self-pay | Admitting: Urology

## 2020-07-02 DIAGNOSIS — J449 Chronic obstructive pulmonary disease, unspecified: Secondary | ICD-10-CM | POA: Diagnosis not present

## 2020-07-02 DIAGNOSIS — Z95810 Presence of automatic (implantable) cardiac defibrillator: Secondary | ICD-10-CM

## 2020-07-02 DIAGNOSIS — I5022 Chronic systolic (congestive) heart failure: Secondary | ICD-10-CM

## 2020-07-02 DIAGNOSIS — J9601 Acute respiratory failure with hypoxia: Secondary | ICD-10-CM | POA: Diagnosis not present

## 2020-07-04 NOTE — Progress Notes (Signed)
EPIC Encounter for ICM Monitoring  Patient Name: Roger Lowe. is a 74 y.o. male Date: 07/04/2020 Primary Care Physican: [name], MD Primary Cardiologist:Branch Electrophysiologist:Taylor 07/02/2020 Weight:232lbs (baseline for last 9 months)   Spoke with patient and reports feeling well at this time.  Denies fluid symptoms.    CorVue thoracic impedance normal but was suggesting possible fluid accumulation from 10/15-10/19.  Prescribed: Furosemide40 mg take 1 tablet daily (increased 05/25/20 due to fluid accumulation)  Labs: 06/01/2020 Creatinine1.28, BUN15, Potassium3.9, Sodium136, GFR56->60 03/20/2020 Creatinine1.17, BUN6, Potassium 3.9, Sodium139, GFR>60  02/16/2020 Creatinine1.23, BUN12, Potassium4.2, XQJJHE174, GFR58->60 A complete set of results can be found in Results Review.  Recommendations: Recommendation to limit salt intake to 2000 mg daily and fluid intake to 64 oz daily.  Encouraged to call if experiencing any fluid symptoms.   Follow-up plan: ICM clinic phone appointment on12/05/2020. 91 day device clinic remote transmission 08/08/2020.   EP/Cardiology Office Visits: 08/13/2020 with Dr Harl Bowie. Recall 6/17/2022with Dr. Lovena Le.   Copy of ICM check sent to Dr.Taylor.   3 month ICM trend: 07/02/2020    1 Year ICM trend:       [name], RN 07/04/2020 3:21 PM

## 2020-07-08 ENCOUNTER — Encounter: Payer: Self-pay | Admitting: Urology

## 2020-07-09 DIAGNOSIS — I129 Hypertensive chronic kidney disease with stage 1 through stage 4 chronic kidney disease, or unspecified chronic kidney disease: Secondary | ICD-10-CM | POA: Diagnosis not present

## 2020-07-09 DIAGNOSIS — E1122 Type 2 diabetes mellitus with diabetic chronic kidney disease: Secondary | ICD-10-CM | POA: Diagnosis not present

## 2020-07-09 DIAGNOSIS — E7849 Other hyperlipidemia: Secondary | ICD-10-CM | POA: Diagnosis not present

## 2020-07-09 DIAGNOSIS — N183 Chronic kidney disease, stage 3 unspecified: Secondary | ICD-10-CM | POA: Diagnosis not present

## 2020-07-09 LAB — CULTURE, FUNGUS WITHOUT SMEAR

## 2020-07-10 ENCOUNTER — Other Ambulatory Visit: Payer: Self-pay | Admitting: Pulmonary Disease

## 2020-07-10 MED ORDER — NYSTATIN 100000 UNIT/ML MT SUSP: 5.0000 mL | Freq: Four times a day (QID) | OROMUCOSAL | 0 refills | Status: DC

## 2020-07-10 NOTE — Progress Notes (Signed)
nys

## 2020-07-11 ENCOUNTER — Telehealth: Payer: Self-pay

## 2020-07-12 DIAGNOSIS — E1122 Type 2 diabetes mellitus with diabetic chronic kidney disease: Secondary | ICD-10-CM | POA: Diagnosis not present

## 2020-07-12 DIAGNOSIS — J418 Mixed simple and mucopurulent chronic bronchitis: Secondary | ICD-10-CM | POA: Diagnosis not present

## 2020-07-12 DIAGNOSIS — N183 Chronic kidney disease, stage 3 unspecified: Secondary | ICD-10-CM | POA: Diagnosis not present

## 2020-07-12 DIAGNOSIS — J449 Chronic obstructive pulmonary disease, unspecified: Secondary | ICD-10-CM | POA: Diagnosis not present

## 2020-07-12 DIAGNOSIS — E7849 Other hyperlipidemia: Secondary | ICD-10-CM | POA: Diagnosis not present

## 2020-07-12 DIAGNOSIS — R6889 Other general symptoms and signs: Secondary | ICD-10-CM | POA: Diagnosis not present

## 2020-07-12 DIAGNOSIS — R972 Elevated prostate specific antigen [PSA]: Secondary | ICD-10-CM | POA: Diagnosis not present

## 2020-07-12 DIAGNOSIS — J411 Mucopurulent chronic bronchitis: Secondary | ICD-10-CM | POA: Diagnosis not present

## 2020-07-12 DIAGNOSIS — I129 Hypertensive chronic kidney disease with stage 1 through stage 4 chronic kidney disease, or unspecified chronic kidney disease: Secondary | ICD-10-CM | POA: Diagnosis not present

## 2020-07-14 DIAGNOSIS — J9601 Acute respiratory failure with hypoxia: Secondary | ICD-10-CM | POA: Diagnosis not present

## 2020-07-14 DIAGNOSIS — I5032 Chronic diastolic (congestive) heart failure: Secondary | ICD-10-CM | POA: Diagnosis not present

## 2020-07-14 DIAGNOSIS — I5022 Chronic systolic (congestive) heart failure: Secondary | ICD-10-CM | POA: Diagnosis not present

## 2020-07-16 NOTE — Telephone Encounter (Signed)
Spoke with pt and went over biopsy instructions and address any questions or concerns pt had.

## 2020-07-17 ENCOUNTER — Encounter (HOSPITAL_COMMUNITY): Payer: Self-pay

## 2020-07-17 ENCOUNTER — Other Ambulatory Visit: Payer: Self-pay | Admitting: Urology

## 2020-07-17 ENCOUNTER — Ambulatory Visit: Payer: Medicare HMO | Admitting: Urology

## 2020-07-17 ENCOUNTER — Other Ambulatory Visit: Payer: Self-pay

## 2020-07-17 ENCOUNTER — Ambulatory Visit (HOSPITAL_COMMUNITY)
Admission: RE | Admit: 2020-07-17 | Discharge: 2020-07-17 | Disposition: A | Discharge status: Home / Self Care | Payer: Medicare HMO | Source: Ambulatory Visit | Attending: Urology | Admitting: Urology

## 2020-07-17 DIAGNOSIS — D291 Benign neoplasm of prostate: Secondary | ICD-10-CM | POA: Insufficient documentation

## 2020-07-17 DIAGNOSIS — R972 Elevated prostate specific antigen [PSA]: Secondary | ICD-10-CM

## 2020-07-17 MED ORDER — GENTAMICIN SULFATE 40 MG/ML IJ SOLN: 160.0000 mg | Freq: Once | INTRAMUSCULAR | Status: AC

## 2020-07-17 MED ORDER — LIDOCAINE HCL (PF) 2 % IJ SOLN
10.0000 mL | Freq: Once | INTRAMUSCULAR | Status: AC
  Administered 2020-07-17: 10 mL

## 2020-07-17 MED ORDER — GENTAMICIN SULFATE 40 MG/ML IJ SOLN
INTRAMUSCULAR | Status: AC
  Administered 2020-07-17: 160 mg via INTRAMUSCULAR
  Filled 2020-07-17: qty 4

## 2020-07-17 MED ORDER — LIDOCAINE HCL (PF) 2 % IJ SOLN
INTRAMUSCULAR | Status: AC
  Filled 2020-07-17: qty 10

## 2020-07-17 NOTE — Discharge Instructions (Signed)

## 2020-07-17 NOTE — Sedation Documentation (Signed)
PT tolerated prostate biopsy procedure well today. Labs obtained and sent for pathology. PT ambulatory at discharge with no acute distress noted and verbalized understanding of discharge instructions. PT to follow up with urologist as scheduled.

## 2020-07-17 NOTE — Progress Notes (Signed)
Risks, benefits, and some of the potential complications of a transrectal ultrasounds of the prostate (TRUSP) with biopsies were discussed at length with the patient including gross hematuria, blood in the bowel movements, hematospermia, bacteremia, infection, voiding discomfort, urinary retention, fever, chills, sepsis, blood transfusion, death, and others. All questions were answered. Informed consent was obtained. The patient confirmed that he had taken his pre-procedure antibiotic. All anticoagulants were discontinued prior to the procedure. The patient emptied his bladder. He was positioned in a comfortable left lateral decubitus position with hips and knees acutely flexed.  The rectal probe was inserted into the rectum without difficulty. 10cc of 2% Lidocaine without epinephrine was instilled with a spinal needle using ultrasound guidance near the junction of each seminal vesicle and the prostate.  Sequential transverse (axial) scans were made in small increments beginning at the seminal vesicles and ending at the prostatic apex. Sequential longitudinal (saggital) scans were made in small increments beginning at the right lateral prostate and ending at the left lateral prostate. Excellent anatomical imaging was obtained. The peripheral, transitional, and central zones were well-defined. The seminal vesicles were normal.  Prostate volume 128 ml.  There were no hypoechoic areas. 12 needle core biopsies were performed. 1 biopsy each was taken from the following areas:  Right lateral base, right medial base, right lateral mid prostate, right medial mid prostate, right lateral apical prostate, right medial apical prostate, left lateral base, left medial base, left lateral mid prostate, left medial mid prostate, left lateral apical prostate, left medial apical prostate.. Minimal prostatic calcifications were noted. Excellent biopsy specimens were obtained.  He did have bleeding from urethra following  procedure--perineal pressure applied for ~ 5 mins stopped this.. Antibiotic instructions were given. The patient was told that:  For several days:  he should increase his fluid intake and limit strenuous activity  he might have mild discomfort at the base of his penis or in his rectum  he might have blood in his urine or blood in his bowel movements  For 2-3 months:  he might have blood in his ejaculate (semen)  Instructions were given to call the office immedicately for blood clots in the urine or bowel movements, difficulty urinating, inability to urinate, urinary retention, painful or frequent urination, fever, chills, nausea, vomiting, or other illness. The patient stated that he understood these instructions and would comply with them. We told the patient that prostate biopsy pathology reports are usually available within 3-5 working days, unless a pathologic second opinion is required, which may take 7-14 days. We told him to contact us to check on the status of his biopsy if he has not heard from [place] within 7 days. The patient left the ultrasound examination room in stable condition.

## 2020-07-18 ENCOUNTER — Ambulatory Visit (INDEPENDENT_AMBULATORY_CARE_PROVIDER_SITE_OTHER): Payer: Medicare HMO | Admitting: Pulmonary Disease

## 2020-07-18 ENCOUNTER — Encounter: Payer: Self-pay | Admitting: Pulmonary Disease

## 2020-07-18 VITALS — BP 130/64 | HR 92 | Temp 97.2°F | Wt 235.5 lb

## 2020-07-18 DIAGNOSIS — J449 Chronic obstructive pulmonary disease, unspecified: Secondary | ICD-10-CM | POA: Diagnosis not present

## 2020-07-18 DIAGNOSIS — I5022 Chronic systolic (congestive) heart failure: Secondary | ICD-10-CM | POA: Diagnosis not present

## 2020-07-18 DIAGNOSIS — J418 Mixed simple and mucopurulent chronic bronchitis: Secondary | ICD-10-CM | POA: Diagnosis not present

## 2020-07-18 DIAGNOSIS — Z85118 Personal history of other malignant neoplasm of bronchus and lung: Secondary | ICD-10-CM | POA: Diagnosis not present

## 2020-07-18 DIAGNOSIS — J9611 Chronic respiratory failure with hypoxia: Secondary | ICD-10-CM | POA: Diagnosis not present

## 2020-07-18 NOTE — Patient Instructions (Addendum)
Thank you for visiting Dr. Valeta Harms at [facility]. Today we recommend the following:  Continue current neb treatments Pulmicort and brovanna, and spiriva  Azithromycin MWF Call us if you have any changes   Return in about 6 months (around 01/15/2021), or if symptoms worsen or fail to improve, for Dr. Valeta Harms or Wyn Quaker, NP.    Please do your part to reduce the spread of COVID-19.
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
## 2020-07-23 ENCOUNTER — Other Ambulatory Visit: Payer: Self-pay | Admitting: Pulmonary Disease

## 2020-07-25 DIAGNOSIS — J449 Chronic obstructive pulmonary disease, unspecified: Secondary | ICD-10-CM | POA: Diagnosis not present

## 2020-07-25 DIAGNOSIS — I5022 Chronic systolic (congestive) heart failure: Secondary | ICD-10-CM | POA: Diagnosis not present

## 2020-07-25 DIAGNOSIS — J9601 Acute respiratory failure with hypoxia: Secondary | ICD-10-CM | POA: Diagnosis not present

## 2020-07-27 NOTE — Telephone Encounter (Signed)
Roger Lowe are you ok to refill? Nystatin mouthwash ordered 07/10/20 60 mL no refills. LM for patient to return call for more detail on why refill needed.

## 2020-07-30 ENCOUNTER — Other Ambulatory Visit: Payer: Self-pay | Admitting: Pulmonary Disease

## 2020-07-30 ENCOUNTER — Telehealth: Payer: Self-pay

## 2020-07-30 DIAGNOSIS — R093 Abnormal sputum: Secondary | ICD-10-CM

## 2020-07-30 NOTE — Telephone Encounter (Signed)
I let the pt know he do not have to send a manual transmission with his monitor. The pt verbalized understanding.

## 2020-07-31 ENCOUNTER — Telehealth: Payer: Self-pay

## 2020-07-31 ENCOUNTER — Ambulatory Visit: Payer: Medicare HMO | Admitting: Urology

## 2020-07-31 ENCOUNTER — Other Ambulatory Visit: Payer: Self-pay | Admitting: Urology

## 2020-07-31 DIAGNOSIS — R972 Elevated prostate specific antigen [PSA]: Secondary | ICD-10-CM

## 2020-07-31 NOTE — Telephone Encounter (Signed)
Pt notified of biopsy results.

## 2020-07-31 NOTE — Telephone Encounter (Signed)
-----   Message from Franchot Gallo, MD sent at 07/31/2020  8:14 AM EST ----- Please call pt--good news all biopsies were benign--keep 6 mo followup w/ PSA as planned ----- Message ----- From: Valentina Lucks, LPN Sent: 25/12/3974   1:06 PM EST To: Franchot Gallo, MD  Pls review.

## 2020-08-01 LAB — AFB ORGANISM ID BY DNA PROBE
M avium complex: NEGATIVE
M tuberculosis complex: NEGATIVE

## 2020-08-01 LAB — RAPID GROWER BROTH SUSCEP.
Ciprofloxacin: 1
Clarithromycin: >16
Doxycycline: 8
Imipenem: 2
Tigecycline: 0.25
Tobramycin: 1

## 2020-08-01 LAB — ORG ID BY SEQUENCING RFLX AST

## 2020-08-01 LAB — ACID FAST CULTURE WITH REFLEXED SENSITIVITIES (MYCOBACTERIA): Acid Fast Culture: POSITIVE — AB

## 2020-08-03 ENCOUNTER — Telehealth: Payer: Self-pay

## 2020-08-03 ENCOUNTER — Ambulatory Visit (INDEPENDENT_AMBULATORY_CARE_PROVIDER_SITE_OTHER): Payer: Medicare HMO | Admitting: Infectious Diseases

## 2020-08-03 ENCOUNTER — Other Ambulatory Visit: Payer: Self-pay

## 2020-08-03 ENCOUNTER — Encounter: Payer: Self-pay | Admitting: Infectious Diseases

## 2020-08-03 VITALS — BP 150/95 | HR 99 | Temp 98.0°F | Ht 72.0 in | Wt 235.0 lb

## 2020-08-03 DIAGNOSIS — A319 Mycobacterial infection, unspecified: Secondary | ICD-10-CM

## 2020-08-03 DIAGNOSIS — R059 Cough, unspecified: Secondary | ICD-10-CM | POA: Diagnosis not present

## 2020-08-03 DIAGNOSIS — Z23 Encounter for immunization: Secondary | ICD-10-CM | POA: Diagnosis not present

## 2020-08-03 DIAGNOSIS — A318 Other mycobacterial infections: Secondary | ICD-10-CM

## 2020-08-03 NOTE — Patient Instructions (Signed)
Please collect sputu in 3 cups and submit it to the lab as I explained Will get baseline labs CT chest  Fu in 2 months

## 2020-08-03 NOTE — Telephone Encounter (Signed)
Faxed signed medical records release authorization form to the office of Dr. Mitzi Hansen Anyadiegwu per Dr. West Bali. Faxed to [phone] per Elray Mcgregor at [facility]. Received receipt of successful fax transmission.   Beryle Flock, RN
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
## 2020-08-04 ENCOUNTER — Other Ambulatory Visit: Payer: Self-pay | Admitting: Student

## 2020-08-04 LAB — CBC WITH DIFFERENTIAL/PLATELET
Absolute Monocytes: 721 cells/uL (ref 200–950)
Basophils Absolute: 28 cells/uL (ref 0–200)
Basophils Relative: 0.4 %
Eosinophils Absolute: 231 cells/uL (ref 15–500)
Eosinophils Relative: 3.3 %
HCT: 42.6 % (ref 38.5–50.0)
Hemoglobin: 14.1 g/dL (ref 13.2–17.1)
Lymphs Abs: 2331 cells/uL (ref 850–3900)
MCH: 29.4 pg (ref 27.0–33.0)
MCHC: 33.1 g/dL (ref 32.0–36.0)
MCV: 88.9 fL (ref 80.0–100.0)
MPV: 9.4 fL (ref 7.5–12.5)
Monocytes Relative: 10.3 %
Neutro Abs: 3689 cells/uL (ref 1500–7800)
Neutrophils Relative %: 52.7 %
Platelets: 222 10*3/uL (ref 140–400)
RBC: 4.79 10*6/uL (ref 4.20–5.80)
RDW: 14 % (ref 11.0–15.0)
Total Lymphocyte: 33.3 %
WBC: 7 10*3/uL (ref 3.8–10.8)

## 2020-08-04 LAB — COMPREHENSIVE METABOLIC PANEL
AG Ratio: 1.1 (calc) (ref 1.0–2.5)
ALT: 11 U/L (ref 9–46)
AST: 15 U/L (ref 10–35)
Albumin: 3.9 g/dL (ref 3.6–5.1)
Alkaline phosphatase (APISO): 95 U/L (ref 35–144)
BUN: 11 mg/dL (ref 7–25)
CO2: 22 mmol/L (ref 20–32)
Calcium: 9.9 mg/dL (ref 8.6–10.3)
Chloride: 105 mmol/L (ref 98–110)
Creat: 1.05 mg/dL (ref 0.70–1.18)
Globulin: 3.4 g/dL (calc) (ref 1.9–3.7)
Glucose, Bld: 116 mg/dL — ABNORMAL HIGH (ref 65–99)
Potassium: 4.2 mmol/L (ref 3.5–5.3)
Sodium: 138 mmol/L (ref 135–146)
Total Bilirubin: 0.4 mg/dL (ref 0.2–1.2)
Total Protein: 7.3 g/dL (ref 6.1–8.1)

## 2020-08-06 NOTE — Telephone Encounter (Signed)
This is a [place] pt, Dr. Harl Bowie

## 2020-08-07 ENCOUNTER — Ambulatory Visit (INDEPENDENT_AMBULATORY_CARE_PROVIDER_SITE_OTHER): Payer: Medicare HMO | Admitting: Urology

## 2020-08-07 ENCOUNTER — Encounter: Payer: Self-pay | Admitting: Urology

## 2020-08-07 ENCOUNTER — Other Ambulatory Visit: Payer: Self-pay

## 2020-08-07 VITALS — BP 142/69 | HR 103 | Temp 98.9°F | Ht 72.0 in | Wt 235.0 lb

## 2020-08-07 DIAGNOSIS — A319 Mycobacterial infection, unspecified: Secondary | ICD-10-CM | POA: Insufficient documentation

## 2020-08-07 DIAGNOSIS — Z23 Encounter for immunization: Secondary | ICD-10-CM | POA: Insufficient documentation

## 2020-08-07 DIAGNOSIS — R972 Elevated prostate specific antigen [PSA]: Secondary | ICD-10-CM

## 2020-08-07 DIAGNOSIS — A318 Other mycobacterial infections: Secondary | ICD-10-CM | POA: Insufficient documentation

## 2020-08-07 NOTE — Assessment & Plan Note (Signed)
Will get more AFB sputum smear and cx CT chest  Baseline labs Fu in 2 months  Fu with Pulmonology
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
## 2020-08-07 NOTE — Assessment & Plan Note (Signed)
Has received Flu vacccine, both doses of COVID vaccine including booster, PNA vaccines

## 2020-08-08 ENCOUNTER — Other Ambulatory Visit: Payer: Medicare HMO

## 2020-08-08 ENCOUNTER — Ambulatory Visit (INDEPENDENT_AMBULATORY_CARE_PROVIDER_SITE_OTHER): Payer: Medicare HMO

## 2020-08-08 DIAGNOSIS — I5022 Chronic systolic (congestive) heart failure: Secondary | ICD-10-CM

## 2020-08-08 DIAGNOSIS — A318 Other mycobacterial infections: Secondary | ICD-10-CM

## 2020-08-08 DIAGNOSIS — I428 Other cardiomyopathies: Secondary | ICD-10-CM

## 2020-08-08 DIAGNOSIS — A319 Mycobacterial infection, unspecified: Secondary | ICD-10-CM | POA: Diagnosis not present

## 2020-08-08 LAB — CUP PACEART REMOTE DEVICE CHECK
Battery Remaining Longevity: 91 mo
Battery Remaining Percentage: 91 %
Battery Voltage: 2.96 V
Date Time Interrogation Session: 20211208020300
HighPow Impedance: 73 Ohm
Implantable Lead Implant Date: 20210308
Implantable Lead Implant Date: 20210308
Implantable Lead Implant Date: 20210308
Implantable Lead Location: 753858
Implantable Lead Location: 753859
Implantable Lead Location: 753860
Implantable Lead Model: 7122
Implantable Pulse Generator Implant Date: 20210308
Lead Channel Impedance Value: 430 Ohm
Lead Channel Impedance Value: 500 Ohm
Lead Channel Impedance Value: 860 Ohm
Lead Channel Pacing Threshold Amplitude: 0.625 V
Lead Channel Pacing Threshold Amplitude: 0.75 V
Lead Channel Pacing Threshold Amplitude: 1.25 V
Lead Channel Pacing Threshold Pulse Width: 0.5 ms
Lead Channel Pacing Threshold Pulse Width: 0.5 ms
Lead Channel Pacing Threshold Pulse Width: 0.5 ms
Lead Channel Sensing Intrinsic Amplitude: 4.2 mV
Lead Channel Sensing Intrinsic Amplitude: 6 mV
Lead Channel Setting Pacing Amplitude: 1.625
Lead Channel Setting Pacing Amplitude: 2 V
Lead Channel Setting Pacing Amplitude: 2.5 V
Lead Channel Setting Pacing Pulse Width: 0.5 ms
Lead Channel Setting Pacing Pulse Width: 0.5 ms
Lead Channel Setting Sensing Sensitivity: 0.5 mV
Pulse Gen Serial Number: 111019088

## 2020-08-08 NOTE — Addendum Note (Signed)
Addended by: Eugenia Mcalpine on: 08/08/2020 02:48 PM   Modules accepted: Orders

## 2020-08-10 ENCOUNTER — Telehealth: Payer: Self-pay

## 2020-08-10 ENCOUNTER — Ambulatory Visit (INDEPENDENT_AMBULATORY_CARE_PROVIDER_SITE_OTHER): Payer: Medicare HMO

## 2020-08-10 DIAGNOSIS — Z95810 Presence of automatic (implantable) cardiac defibrillator: Secondary | ICD-10-CM | POA: Diagnosis not present

## 2020-08-10 DIAGNOSIS — I5022 Chronic systolic (congestive) heart failure: Secondary | ICD-10-CM | POA: Diagnosis not present

## 2020-08-10 NOTE — Telephone Encounter (Signed)
Remote ICM transmission received.  Attempted call to patient regarding ICM remote transmission and left detailed message per DPR to return call.  Advised to return call for any fluid symptoms or questions.      

## 2020-08-10 NOTE — Progress Notes (Signed)
EPIC Encounter for ICM Monitoring  Patient Name: Roger Lowe. is a 74 y.o. male Date: 08/10/2020 Primary Care Physican: [name], MD Primary Cardiologist:Branch Electrophysiologist:Taylor 07/02/2020 Weight:232lbs (baseline for last 9 months)   Attempted call to patient and unable to reach.  Left message to return call. Transmission reviewed.    CorVue thoracic impedancesuggesting possible fluid accumulation starting 08/08/2020.  Also suggesting possible fluid accumulation from 11/25 - 12/3.  Prescribed: Furosemide40 mg take 1 tablet daily.  Labs: 08/03/2020 Creatinine 1.05, BUN 11, Potassium 4.2, Sodium 138 06/01/2020 Creatinine1.28, BUN15, Potassium3.9, Sodium136, GFR56->60 03/20/2020 Creatinine1.17, BUN6, Potassium 3.9, Sodium139, GFR>60  02/16/2020 Creatinine1.23, BUN12, Potassium4.2, PRFFMB846, GFR58->60 A complete set of results can be found in Results Review.  Recommendations:Unable to reach.    Follow-up plan: ICM clinic phone appointment on12/17/2021 to recheck fluid levels. 91 day device clinic remote transmission 11/07/2020.   EP/Cardiology Office Visits: 08/13/2020 with Dr Harl Bowie. Recall 6/17/2022with Dr. Lovena Le.   Copy of ICM check sent to Dr.Taylor and Dr Harl Bowie as Juluis Rainier for OV on 08/13/2020.   3 month ICM trend: 08/09/2020    1 Year ICM trend:       [name], RN 08/10/2020 1:48 PM

## 2020-08-13 ENCOUNTER — Encounter: Payer: Self-pay | Admitting: Cardiology

## 2020-08-13 ENCOUNTER — Ambulatory Visit (INDEPENDENT_AMBULATORY_CARE_PROVIDER_SITE_OTHER): Payer: Medicare HMO | Admitting: Cardiology

## 2020-08-13 ENCOUNTER — Telehealth: Payer: Self-pay

## 2020-08-13 ENCOUNTER — Other Ambulatory Visit: Payer: Self-pay

## 2020-08-13 VITALS — BP 126/80 | HR 100 | Ht 72.0 in | Wt 236.0 lb

## 2020-08-13 DIAGNOSIS — I10 Essential (primary) hypertension: Secondary | ICD-10-CM | POA: Diagnosis not present

## 2020-08-13 DIAGNOSIS — I2510 Atherosclerotic heart disease of native coronary artery without angina pectoris: Secondary | ICD-10-CM | POA: Diagnosis not present

## 2020-08-13 DIAGNOSIS — I5022 Chronic systolic (congestive) heart failure: Secondary | ICD-10-CM | POA: Diagnosis not present

## 2020-08-13 DIAGNOSIS — I5032 Chronic diastolic (congestive) heart failure: Secondary | ICD-10-CM | POA: Diagnosis not present

## 2020-08-13 DIAGNOSIS — J9601 Acute respiratory failure with hypoxia: Secondary | ICD-10-CM | POA: Diagnosis not present
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
## 2020-08-13 NOTE — Patient Instructions (Signed)
Medication Instructions:  INCREASE Lasix to 60 mg for 4 days and then go back to 40 mg daily  *If you need a refill on your cardiac medications before your next appointment, please call your pharmacy*   Lab Work: None today If you have labs (blood work) drawn today and your tests are completely normal, you will receive your results only by: [name] MyChart Message (if you have MyChart) OR . A paper copy in the mail If you have any lab test that is abnormal or we need to change your treatment, we will call you to review the results.   Testing/Procedures: Your physician has requested that you have an echocardiogram. Echocardiography is a painless test that uses sound waves to create images of your heart. It provides your doctor with information about the size and shape of your heart and how well your heart's chambers and valves are working. This procedure takes approximately one hour. There are no restrictions for this procedure.     Follow-Up: At [facility], you and your health needs are our priority.  As part of our continuing mission to provide you with exceptional heart care, we have created designated Provider Care Teams.  These Care Teams include your primary Cardiologist (physician) and Advanced Practice Providers (APPs -  Physician Assistants and Nurse Practitioners) who all work together to provide you with the care you need, when you need it.  We recommend signing up for the patient portal called "MyChart".  Sign up information is provided on this After Visit Summary.  MyChart is used to connect with patients for Virtual Visits (Telemedicine).  Patients are able to view lab/test results, encounter notes, upcoming appointments, etc.  Non-urgent messages can be sent to your provider as well.   To learn more about what you can do with MyChart, go to [email or web address].    Your next appointment:   3 month(s)  The format for your next appointment:   In Person  Provider:    Carlyle Dolly, MD   Other Instructions None      Thank you for choosing [facility] !

## 2020-08-13 NOTE — Telephone Encounter (Signed)
I saw that, will wait for the cultures to grow. I have a follow up with him.

## 2020-08-13 NOTE — Telephone Encounter (Signed)
Preliminary results of macrobacteria culture called in to triage from  [facility].  Reports shows  Rare acid-fast bacilli Culture results to follow.  Routing to MD for review.  Eugenia Mcalpine

## 2020-08-17 ENCOUNTER — Ambulatory Visit (INDEPENDENT_AMBULATORY_CARE_PROVIDER_SITE_OTHER): Payer: Medicare HMO

## 2020-08-17 ENCOUNTER — Other Ambulatory Visit: Payer: Self-pay

## 2020-08-17 ENCOUNTER — Telehealth: Payer: Self-pay

## 2020-08-17 ENCOUNTER — Ambulatory Visit (HOSPITAL_COMMUNITY)
Admission: RE | Admit: 2020-08-17 | Discharge: 2020-08-17 | Disposition: A | Discharge status: Home / Self Care | Payer: Medicare HMO | Source: Ambulatory Visit | Attending: Cardiology | Admitting: Cardiology

## 2020-08-17 DIAGNOSIS — I5022 Chronic systolic (congestive) heart failure: Secondary | ICD-10-CM | POA: Insufficient documentation

## 2020-08-17 DIAGNOSIS — Z95810 Presence of automatic (implantable) cardiac defibrillator: Secondary | ICD-10-CM

## 2020-08-17 LAB — ECHOCARDIOGRAM COMPLETE
AR max vel: 2.26 cm2
AV Area VTI: 2.13 cm2
AV Area mean vel: 1.95 cm2
AV Mean grad: 2.7 mmHg
AV Peak grad: 5.2 mmHg
Ao pk vel: 1.14 m/s
Area-P 1/2: 6.12 cm2
S' Lateral: 3.1 cm

## 2020-08-17 NOTE — Progress Notes (Signed)
EPIC Encounter for ICM Monitoring  Patient Name: Roger Lowe. is a 74 y.o. male Date: 08/17/2020 Primary Care Physican: [name], MD Primary Cardiologist:Branch Electrophysiologist:Taylor 07/02/2020 Weight:232lbs (baseline for last 9 months)   Attempted call to patient and unable to reach.  Transmission reviewed.    CorVue thoracic impedance suggesting fluid levels returned to normal.  Prescribed: Furosemide40 mg take 1 tablet daily.  Labs: 08/03/2020 Creatinine 1.05, BUN 11, Potassium 4.2, Sodium 138 06/01/2020 Creatinine1.28, BUN15, Potassium3.9, Sodium136, GFR56->60 03/20/2020 Creatinine1.17, BUN6, Potassium 3.9, Sodium139, GFR>60  02/16/2020 Creatinine1.23, BUN12, Potassium4.2, [id number], GFR58->60 A complete set of results can be found in Results Review.  Recommendations:Unable to reach.    Follow-up plan: ICM clinic phone appointment on1/06/2021. 91 day device clinic remote transmission 11/07/2020.   EP/Cardiology Office Visits:  Recall 6/17/2022with Dr. Lovena Le.   Copy of ICM check sent to Dr.Taylor and Dr Harl Bowie .   3 month ICM trend: 12/17//2021    1 Year ICM trend:       [name], RN 08/17/2020 5:04 PM

## 2020-08-17 NOTE — Progress Notes (Signed)
*  PRELIMINARY RESULTS* Echocardiogram 2D Echocardiogram has been performed.  [name] 08/17/2020, 11:58 AM

## 2020-08-17 NOTE — Telephone Encounter (Signed)
Remote ICM transmission received.  Attempted call to patient regarding ICM remote transmission and no answer.  

## 2020-08-20 NOTE — Progress Notes (Signed)
Remote ICD transmission.   

## 2020-08-27 ENCOUNTER — Telehealth: Payer: Self-pay

## 2020-08-27 NOTE — Telephone Encounter (Signed)
Received call from [facility] to report updated mycobacterium culture results that were collected 08/08/20. 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   Beryle Flock, RN

## 2020-08-28 ENCOUNTER — Ambulatory Visit (HOSPITAL_COMMUNITY)
Admission: RE | Admit: 2020-08-28 | Discharge: 2020-08-28 | Disposition: A | Discharge status: Home / Self Care | Payer: Medicare HMO | Source: Ambulatory Visit | Attending: Infectious Diseases | Admitting: Infectious Diseases

## 2020-08-28 ENCOUNTER — Other Ambulatory Visit: Payer: Self-pay

## 2020-08-28 DIAGNOSIS — Z950 Presence of cardiac pacemaker: Secondary | ICD-10-CM | POA: Diagnosis not present

## 2020-08-28 DIAGNOSIS — J479 Bronchiectasis, uncomplicated: Secondary | ICD-10-CM | POA: Insufficient documentation

## 2020-08-28 DIAGNOSIS — J439 Emphysema, unspecified: Secondary | ICD-10-CM | POA: Diagnosis not present

## 2020-08-28 DIAGNOSIS — R059 Cough, unspecified: Secondary | ICD-10-CM

## 2020-08-28 DIAGNOSIS — I700 Atherosclerosis of aorta: Secondary | ICD-10-CM | POA: Diagnosis not present

## 2020-08-28 IMAGING — CT CT CHEST W/ CM
2 of 4 series · 14 of 36 positions shown, 17 images · IV contrast (APPLIED)
Comparison: Chest radiograph [DATE]; chest CT JADA

CLINICAL DATA: Persistent cough

EXAM:
CT CHEST WITH CONTRAST
TECHNIQUE: Multidetector CT imaging of the chest was performed during
intravenous contrast administration.
CONTRAST:  75mL OMNIPAQUE IOHEXOL 300 MG/ML  SOLN

[Series 3: chest w · axial · 0.73mm/px · z∈[-283,-19]mm · 11 of 158 slices shown, 14 images]
[im 13/158  mediastinal]
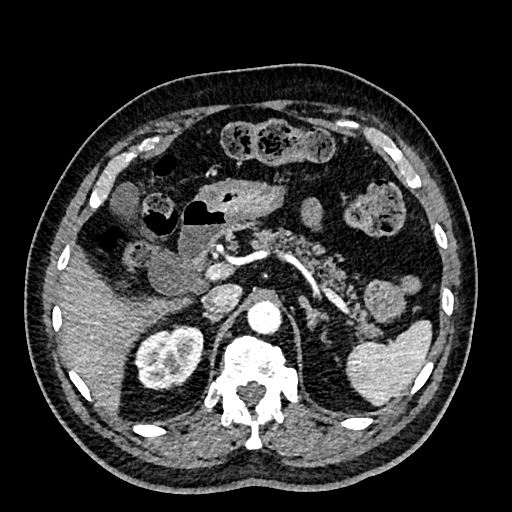
[im 13/158  lung]
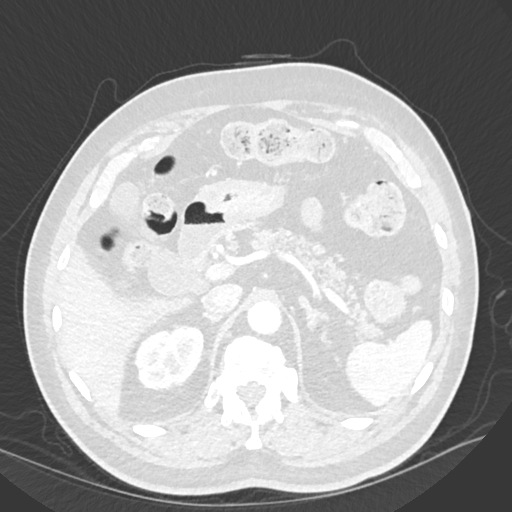
[im 25/158  lung]
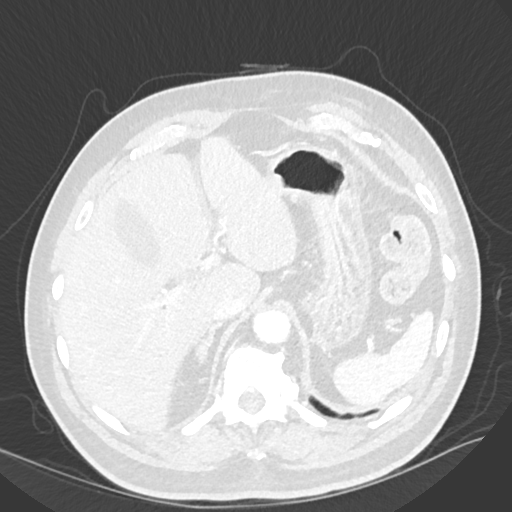
[im 37/158  lung]
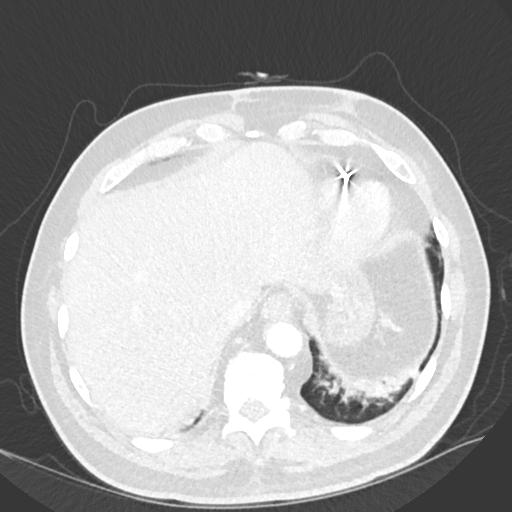
[im 49/158  lung]
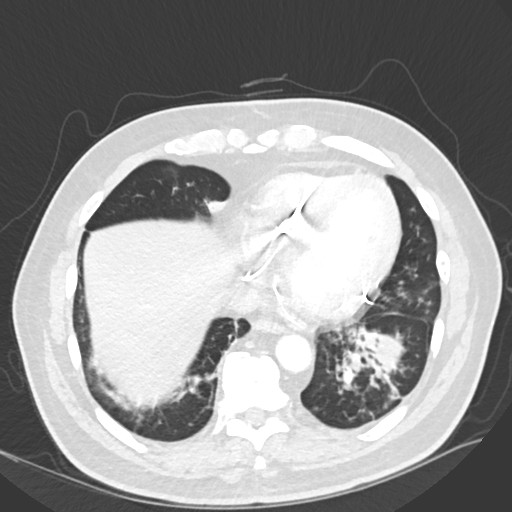
[im 61/158  mediastinal]
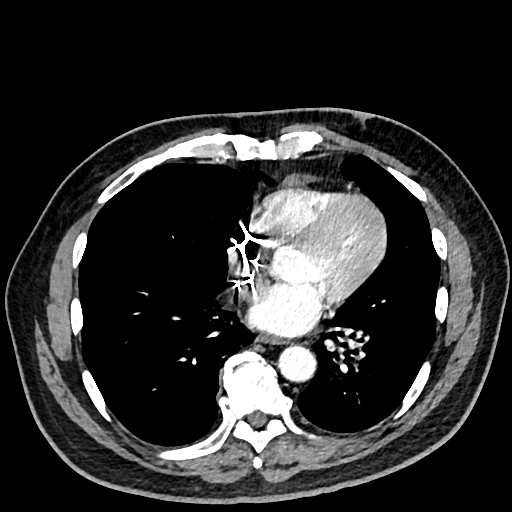
[im 61/158  lung]
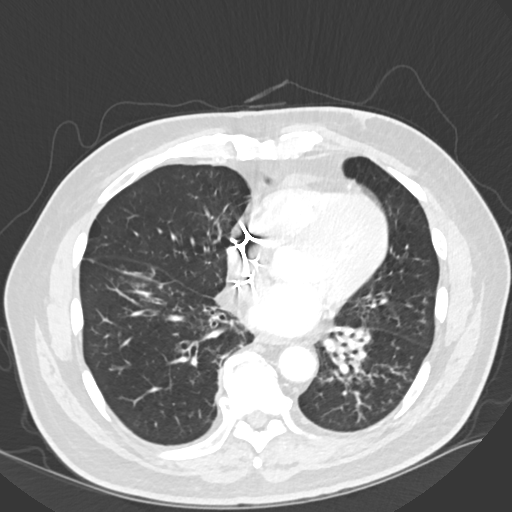
[im 85/158  lung]
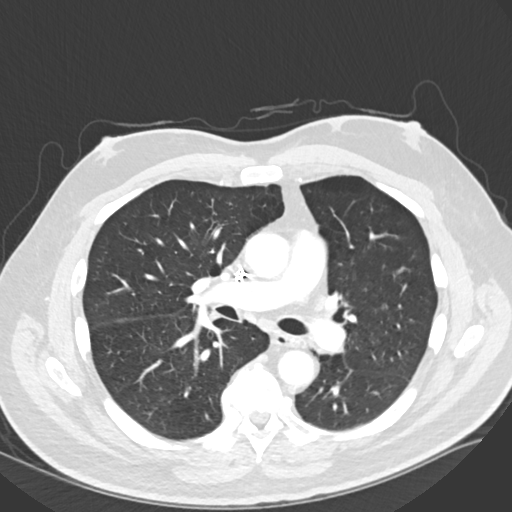
[im 97/158  lung]
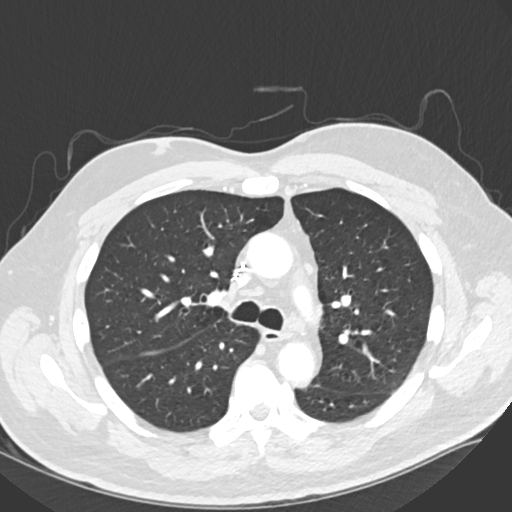
[im 109/158  lung]
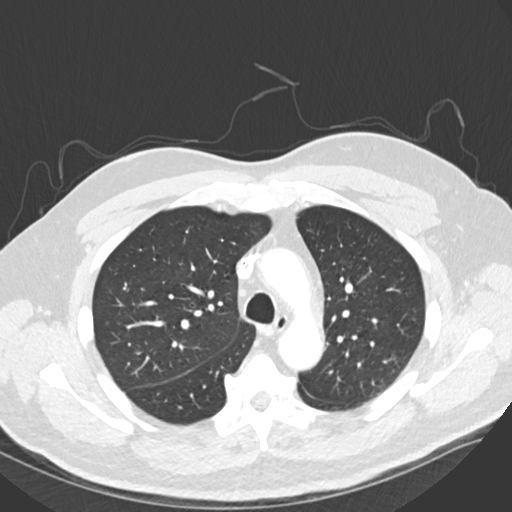
[im 121/158  mediastinal]
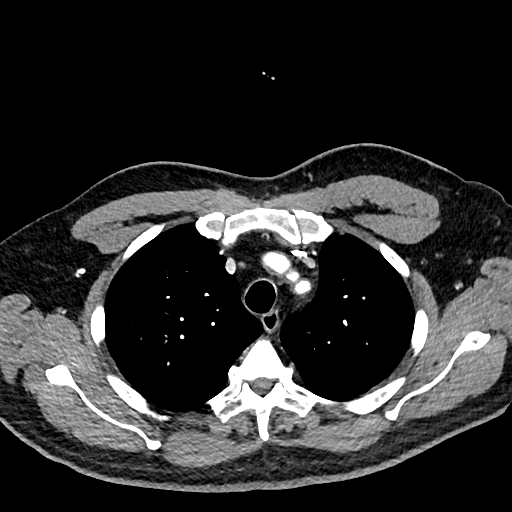
[im 121/158  lung]
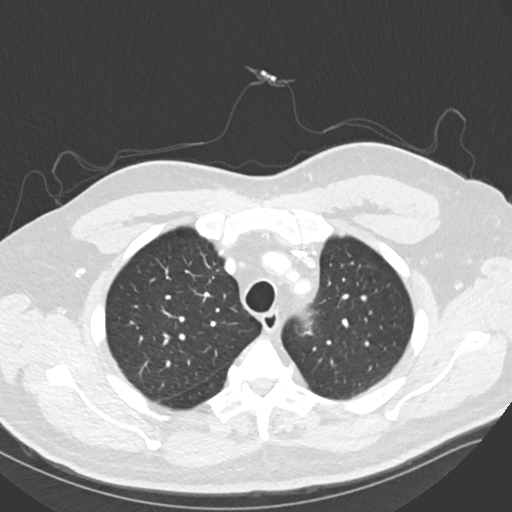
[im 133/158  lung]
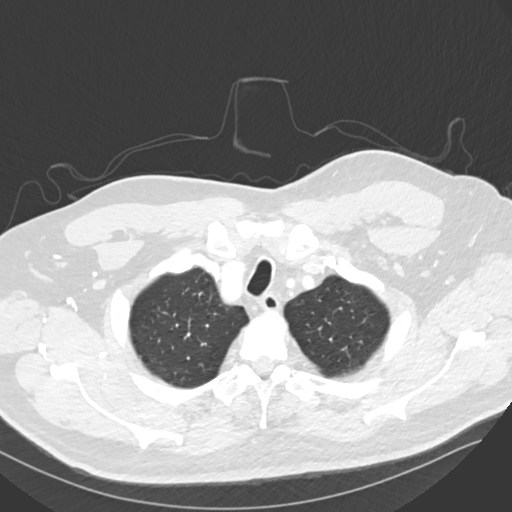
[im 145/158  lung]
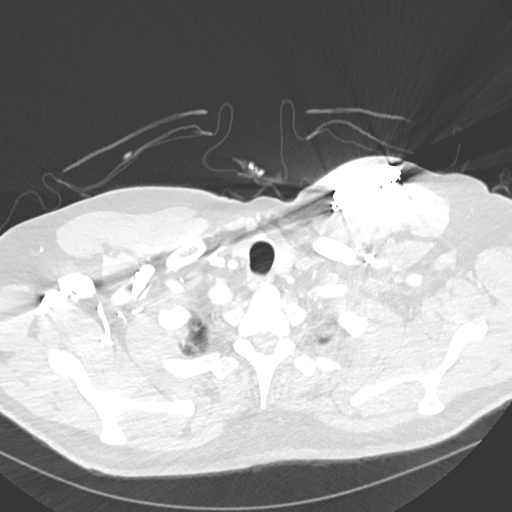

[Series 6: cor · coronal · 0.62mm/px · 3 of 163 slices shown]
[im 33/163  lung]
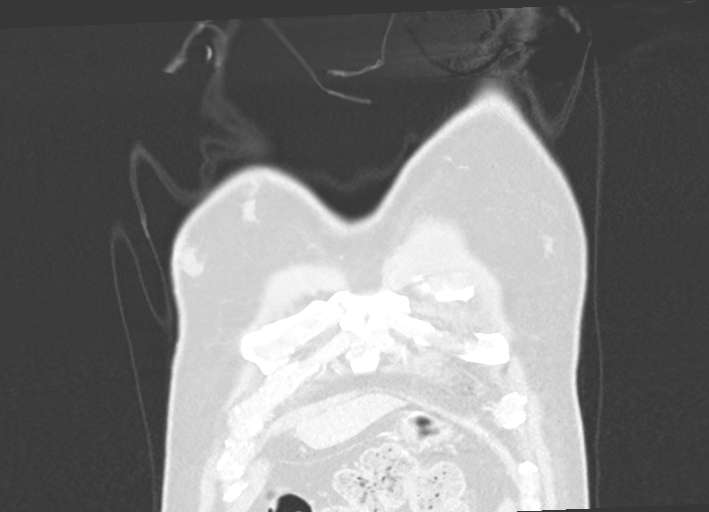
[im 65/163  lung]
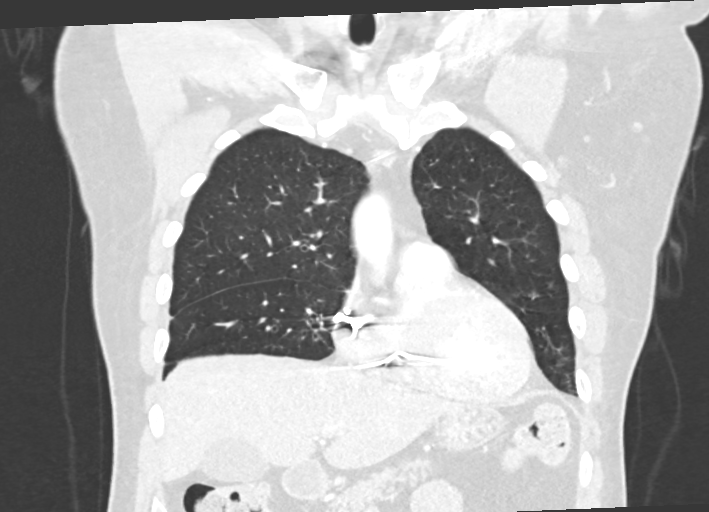
[im 98/163  lung]
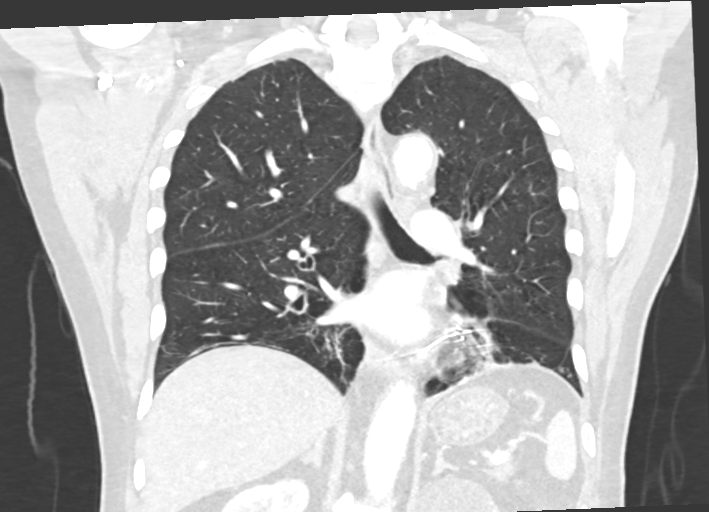

[14 of 36 positions shown; findings below may reference images not displayed]

FINDINGS: Cardiovascular: No thoracic aortic aneurysm or dissection.
Visualized great vessels appear unremarkable. There are foci of
aortic atherosclerosis. No evident pulmonary embolus. No appreciable
pericardial effusion or pericardial thickening. Pacemaker leads are
attached to the right atrium, right ventricle, and coronary sinus.

Mediastinum/Nodes: Visualized thyroid appears unremarkable. There
are scattered subcentimeter mediastinal lymph nodes without
adenopathy by size criteria evident. No appreciable esophageal
lesions.

Lungs/Pleura: Underlying centrilobular and paraseptal emphysematous
changes are noted. There is lower lobe bronchiectatic change,
similar to prior study. There is again noted consolidation in
portions of the left lower lobe, in part due to mucoid impaction.
Atelectatic changes also noted in the lung bases. No new opacity is
appreciable compared to the previous study. There are several
scattered areas of mild scarring. No pleural effusions are evident.

Upper Abdomen: There is incomplete visualization of a cyst arising
from the upper left kidney measuring 5.7 x 4.9 cm there is hepatic
steatosis. Visualized upper abdominal structures otherwise appear
unremarkable.

Musculoskeletal: There are foci of degenerative change in the
thoracic spine. No blastic or lytic bone lesions. Pacemaker present
anteriorly on the left.
IMPRESSION: 1. Underlying emphysematous changes, stable. Lower lobe
bronchiectatic change persists. Consolidation in portions of the
left lower lobe, primarily laterally, persists. This consolidation
appears largely due to mucoid impaction. No progression of opacity
in this area evident. Scattered areas of atelectasis in the lower
lung regions noted. No new consolidation.

2. No adenopathy evident by size criteria. Multiple subcentimeter
lymph nodes evident.

3. Aortic atherosclerosis. No thoracic aortic aneurysm or
dissection.

4. Pacemaker leads attached to right atrium, right ventricle, and
coronary sinus.

Aortic Atherosclerosis ([0X]-[0X]) and Emphysema ([0X]-[0X]).

## 2020-08-28 MED ORDER — IOHEXOL 300 MG/ML  SOLN
75.0000 mL | Freq: Once | INTRAMUSCULAR | Status: AC | PRN
  Administered 2020-08-28: 75 mL via INTRAVENOUS

## 2020-08-30 DIAGNOSIS — J449 Chronic obstructive pulmonary disease, unspecified: Secondary | ICD-10-CM | POA: Diagnosis not present

## 2020-08-30 DIAGNOSIS — J9601 Acute respiratory failure with hypoxia: Secondary | ICD-10-CM | POA: Diagnosis not present

## 2020-08-30 DIAGNOSIS — I5022 Chronic systolic (congestive) heart failure: Secondary | ICD-10-CM | POA: Diagnosis not present

## 2020-09-07 DIAGNOSIS — H6503 Acute serous otitis media, bilateral: Secondary | ICD-10-CM | POA: Diagnosis not present

## 2020-09-07 DIAGNOSIS — Z0001 Encounter for general adult medical examination with abnormal findings: Secondary | ICD-10-CM | POA: Diagnosis not present

## 2020-09-07 DIAGNOSIS — G894 Chronic pain syndrome: Secondary | ICD-10-CM | POA: Diagnosis not present

## 2020-09-07 DIAGNOSIS — H6122 Impacted cerumen, left ear: Secondary | ICD-10-CM | POA: Diagnosis not present

## 2020-09-07 DIAGNOSIS — R945 Abnormal results of liver function studies: Secondary | ICD-10-CM | POA: Diagnosis not present

## 2020-09-07 DIAGNOSIS — E1122 Type 2 diabetes mellitus with diabetic chronic kidney disease: Secondary | ICD-10-CM | POA: Diagnosis not present

## 2020-09-07 DIAGNOSIS — Z9981 Dependence on supplemental oxygen: Secondary | ICD-10-CM | POA: Diagnosis not present

## 2020-09-07 DIAGNOSIS — Z0189 Encounter for other specified special examinations: Secondary | ICD-10-CM | POA: Diagnosis not present

## 2020-09-10 ENCOUNTER — Ambulatory Visit (INDEPENDENT_AMBULATORY_CARE_PROVIDER_SITE_OTHER): Payer: Medicare HMO

## 2020-09-10 DIAGNOSIS — Z95810 Presence of automatic (implantable) cardiac defibrillator: Secondary | ICD-10-CM

## 2020-09-10 DIAGNOSIS — I5022 Chronic systolic (congestive) heart failure: Secondary | ICD-10-CM | POA: Diagnosis not present

## 2020-09-11 ENCOUNTER — Telehealth: Payer: Self-pay | Admitting: *Deleted

## 2020-09-11 NOTE — Progress Notes (Signed)
EPIC Encounter for ICM Monitoring  Patient Name: Roger Lowe. is a 75 y.o. male Date: 09/11/2020 Primary Care Physican: [name], MD Primary Cardiologist:Branch Electrophysiologist:Taylor 09/11/2020 Weight:232lbs (baseline for last 9 months)   Spoke with patient and reports feeling well at this time.  Denies fluid symptoms.    CorVue thoracic impedance suggesting normal fluid levels.  Prescribed: Furosemide40 mg take 1 tablet daily.  Labs: 08/03/2020 Creatinine 1.05, BUN 11, Potassium 4.2, Sodium 138 06/01/2020 Creatinine1.28, BUN15, Potassium3.9, Sodium136, GFR56->60 03/20/2020 Creatinine1.17, BUN6, Potassium 3.9, Sodium139, GFR>60  02/16/2020 Creatinine1.23, BUN12, Potassium4.2, [id number], GFR58->60 A complete set of results can be found in Results Review.  Recommendations:No changes and encouraged to call if experiencing any fluid symptoms.  Follow-up plan: ICM clinic phone appointment on2/14/2022. 91 day device clinic remote transmission3/05/2021.   EP/Cardiology Office Visits: Recall 6/17/2022with Dr. Lovena Le.   Copy of ICM check sent to Dr.Taylor  3 month ICM trend: 09/10/2020.    1 Year ICM trend:       [name], RN 09/11/2020 9:21 AM

## 2020-09-11 NOTE — Telephone Encounter (Signed)
Received critical lab results.   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 for Identification. DNA probe result negative for M. tuberculosis complex (M. tuberculosis, M. bovis, M. bovis BCG, M. africanum, M.  microti, and M. canetti)    RN confirmed this sample is being sent on for culture and sensitivities. Landis Gandy, RN

## 2020-09-11 NOTE — Telephone Encounter (Signed)
Thank you for the update, will wait for the identification of the species and discuss regarding tx options in next clinic visit in February.

## 2020-09-12 ENCOUNTER — Other Ambulatory Visit: Payer: Self-pay

## 2020-09-12 DIAGNOSIS — Z8701 Personal history of pneumonia (recurrent): Secondary | ICD-10-CM | POA: Diagnosis not present

## 2020-09-12 DIAGNOSIS — I10 Essential (primary) hypertension: Secondary | ICD-10-CM | POA: Diagnosis not present

## 2020-09-12 DIAGNOSIS — E782 Mixed hyperlipidemia: Secondary | ICD-10-CM | POA: Diagnosis not present

## 2020-09-12 DIAGNOSIS — I2510 Atherosclerotic heart disease of native coronary artery without angina pectoris: Secondary | ICD-10-CM | POA: Diagnosis not present

## 2020-09-12 DIAGNOSIS — M109 Gout, unspecified: Secondary | ICD-10-CM | POA: Diagnosis not present

## 2020-09-12 DIAGNOSIS — G43009 Migraine without aura, not intractable, without status migrainosus: Secondary | ICD-10-CM | POA: Diagnosis not present

## 2020-09-12 DIAGNOSIS — K219 Gastro-esophageal reflux disease without esophagitis: Secondary | ICD-10-CM | POA: Diagnosis not present

## 2020-09-12 DIAGNOSIS — G894 Chronic pain syndrome: Secondary | ICD-10-CM | POA: Diagnosis not present

## 2020-09-12 DIAGNOSIS — E1165 Type 2 diabetes mellitus with hyperglycemia: Secondary | ICD-10-CM | POA: Diagnosis not present

## 2020-09-13 DIAGNOSIS — I5022 Chronic systolic (congestive) heart failure: Secondary | ICD-10-CM | POA: Diagnosis not present

## 2020-09-13 DIAGNOSIS — J9601 Acute respiratory failure with hypoxia: Secondary | ICD-10-CM | POA: Diagnosis not present

## 2020-09-13 DIAGNOSIS — I5032 Chronic diastolic (congestive) heart failure: Secondary | ICD-10-CM | POA: Diagnosis not present

## 2020-09-19 LAB — CP MYCOBACTERIAL IDENTIFICATION

## 2020-09-19 LAB — MYCOBACTERIA,CULT W/FLUOROCHROME SMEAR
MICRO NUMBER:: 11294114
SPECIMEN QUALITY:: ADEQUATE

## 2020-09-24 LAB — MYCOBACTERIA,CULT W/FLUOROCHROME SMEAR
MICRO NUMBER:: 11294113
SMEAR:: NONE SEEN
SPECIMEN QUALITY:: ADEQUATE

## 2020-09-25 DIAGNOSIS — I5022 Chronic systolic (congestive) heart failure: Secondary | ICD-10-CM | POA: Diagnosis not present

## 2020-09-25 DIAGNOSIS — J449 Chronic obstructive pulmonary disease, unspecified: Secondary | ICD-10-CM | POA: Diagnosis not present

## 2020-09-25 DIAGNOSIS — J9601 Acute respiratory failure with hypoxia: Secondary | ICD-10-CM | POA: Diagnosis not present

## 2020-09-29 DIAGNOSIS — M109 Gout, unspecified: Secondary | ICD-10-CM | POA: Diagnosis not present

## 2020-09-29 DIAGNOSIS — I2510 Atherosclerotic heart disease of native coronary artery without angina pectoris: Secondary | ICD-10-CM | POA: Diagnosis not present

## 2020-09-29 DIAGNOSIS — G43009 Migraine without aura, not intractable, without status migrainosus: Secondary | ICD-10-CM | POA: Diagnosis not present

## 2020-09-29 DIAGNOSIS — Z8701 Personal history of pneumonia (recurrent): Secondary | ICD-10-CM | POA: Diagnosis not present

## 2020-09-29 DIAGNOSIS — K219 Gastro-esophageal reflux disease without esophagitis: Secondary | ICD-10-CM | POA: Diagnosis not present

## 2020-09-29 DIAGNOSIS — E1165 Type 2 diabetes mellitus with hyperglycemia: Secondary | ICD-10-CM | POA: Diagnosis not present

## 2020-09-29 DIAGNOSIS — I10 Essential (primary) hypertension: Secondary | ICD-10-CM | POA: Diagnosis not present

## 2020-09-29 DIAGNOSIS — E782 Mixed hyperlipidemia: Secondary | ICD-10-CM | POA: Diagnosis not present

## 2020-09-29 DIAGNOSIS — G894 Chronic pain syndrome: Secondary | ICD-10-CM | POA: Diagnosis not present

## 2020-10-02 DIAGNOSIS — Z0100 Encounter for examination of eyes and vision without abnormal findings: Secondary | ICD-10-CM | POA: Diagnosis not present

## 2020-10-02 DIAGNOSIS — R6889 Other general symptoms and signs: Secondary | ICD-10-CM | POA: Diagnosis not present

## 2020-10-02 DIAGNOSIS — H5200 Hypermetropia, unspecified eye: Secondary | ICD-10-CM | POA: Diagnosis not present

## 2020-10-04 ENCOUNTER — Encounter: Payer: Self-pay | Admitting: Infectious Diseases

## 2020-10-04 ENCOUNTER — Ambulatory Visit (INDEPENDENT_AMBULATORY_CARE_PROVIDER_SITE_OTHER): Payer: Medicare HMO | Admitting: Pulmonary Disease

## 2020-10-04 ENCOUNTER — Ambulatory Visit (INDEPENDENT_AMBULATORY_CARE_PROVIDER_SITE_OTHER): Payer: Medicare HMO | Admitting: Infectious Diseases

## 2020-10-04 ENCOUNTER — Other Ambulatory Visit: Payer: Self-pay

## 2020-10-04 VITALS — BP 112/78 | HR 96 | Temp 97.7°F | Ht 73.0 in | Wt 226.0 lb

## 2020-10-04 VITALS — BP 106/70 | HR 94 | Temp 98.1°F | Ht 73.0 in | Wt 236.0 lb

## 2020-10-04 DIAGNOSIS — J441 Chronic obstructive pulmonary disease with (acute) exacerbation: Secondary | ICD-10-CM | POA: Diagnosis not present

## 2020-10-04 DIAGNOSIS — J418 Mixed simple and mucopurulent chronic bronchitis: Secondary | ICD-10-CM

## 2020-10-04 DIAGNOSIS — A318 Other mycobacterial infections: Secondary | ICD-10-CM

## 2020-10-04 DIAGNOSIS — A319 Mycobacterial infection, unspecified: Secondary | ICD-10-CM

## 2020-10-04 DIAGNOSIS — Z85118 Personal history of other malignant neoplasm of bronchus and lung: Secondary | ICD-10-CM | POA: Diagnosis not present

## 2020-10-04 DIAGNOSIS — J449 Chronic obstructive pulmonary disease, unspecified: Secondary | ICD-10-CM | POA: Diagnosis not present

## 2020-10-04 MED ORDER — AZITHROMYCIN 250 MG PO TABS: 250.0000 mg | ORAL_TABLET | ORAL | 2 refills | Status: AC

## 2020-10-04 MED ORDER — SPIRIVA RESPIMAT 2.5 MCG/ACT IN AERS: 2.0000 | INHALATION_SPRAY | Freq: Every day | RESPIRATORY_TRACT | 3 refills | Status: DC

## 2020-10-04 NOTE — Patient Instructions (Signed)
Thank you for visiting Dr. Valeta Harms at [facility]. Today we recommend the following: Orders Placed This Encounter  Procedures  . AMB REFERRAL FOR DME   Meds ordered this encounter  Medications  . Tiotropium Bromide Monohydrate (SPIRIVA RESPIMAT) 2.5 MCG/ACT AERS    Sig: Inhale 2 puffs into the lungs daily.    Dispense:  12 g    Refill:  3    Order Specific Question:   Lot Number?    Answer:   [id number] F  . azithromycin (ZITHROMAX) 250 MG tablet    Sig: Take 1 tablet (250 mg total) by mouth every Monday, Wednesday, and Friday.    Dispense:  36 tablet    Refill:  2   Return in about 3 months (around 01/01/2021) for with APP or Dr. Valeta Harms.    Please do your part to reduce the spread of COVID-19.
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
## 2020-10-05 DIAGNOSIS — J449 Chronic obstructive pulmonary disease, unspecified: Secondary | ICD-10-CM | POA: Diagnosis not present

## 2020-10-09 DIAGNOSIS — J449 Chronic obstructive pulmonary disease, unspecified: Secondary | ICD-10-CM | POA: Diagnosis not present

## 2020-10-14 DIAGNOSIS — I5022 Chronic systolic (congestive) heart failure: Secondary | ICD-10-CM | POA: Diagnosis not present

## 2020-10-14 DIAGNOSIS — J9601 Acute respiratory failure with hypoxia: Secondary | ICD-10-CM | POA: Diagnosis not present

## 2020-10-14 DIAGNOSIS — I5032 Chronic diastolic (congestive) heart failure: Secondary | ICD-10-CM | POA: Diagnosis not present

## 2020-10-15 ENCOUNTER — Ambulatory Visit (INDEPENDENT_AMBULATORY_CARE_PROVIDER_SITE_OTHER): Payer: Medicare HMO

## 2020-10-15 DIAGNOSIS — Z95810 Presence of automatic (implantable) cardiac defibrillator: Secondary | ICD-10-CM | POA: Diagnosis not present

## 2020-10-15 DIAGNOSIS — I5022 Chronic systolic (congestive) heart failure: Secondary | ICD-10-CM

## 2020-10-19 NOTE — Progress Notes (Signed)
EPIC Encounter for ICM Monitoring  Patient Name: Roger Lowe. is a 75 y.o. male Date: 10/19/2020 Primary Care Physican: [name], MD Primary Cardiologist:Branch Electrophysiologist:Taylor 09/11/2020 Weight:232lbs (baseline for last 9 months)   Spoke with patient and reports feeling well at this time.  Denies fluid symptoms.    CorVue thoracic impedancesuggesting normal fluid levels.  Prescribed: Furosemide40 mg take 1 tablet daily.  Labs: 08/03/2020 Creatinine 1.05, BUN 11, Potassium 4.2, Sodium 138 06/01/2020 Creatinine1.28, BUN15, Potassium3.9, Sodium136, GFR56->60 03/20/2020 Creatinine1.17, BUN6, Potassium 3.9, Sodium139, GFR>60  02/16/2020 Creatinine1.23, BUN12, Potassium4.2, [id number], GFR58->60 A complete set of results can be found in Results Review.  Recommendations:No changes and encouraged to call if experiencing any fluid symptoms.  Follow-up plan: ICM clinic phone appointment on3/21/2022. 91 day device clinic remote transmission3/05/2021.   EP/Cardiology Office Visits: Recall 6/17/2022with Dr. Lovena Le.   Copy of ICM check sent to Dr.Taylor  3 month ICM trend: 10/18/2020.    1 Year ICM trend:       [name], RN 10/19/2020 11:58 AM

## 2020-10-21 DIAGNOSIS — K219 Gastro-esophageal reflux disease without esophagitis: Secondary | ICD-10-CM | POA: Insufficient documentation

## 2020-10-21 DIAGNOSIS — Z95810 Presence of automatic (implantable) cardiac defibrillator: Secondary | ICD-10-CM | POA: Insufficient documentation

## 2020-10-23 DIAGNOSIS — J9601 Acute respiratory failure with hypoxia: Secondary | ICD-10-CM | POA: Diagnosis not present

## 2020-10-23 DIAGNOSIS — J449 Chronic obstructive pulmonary disease, unspecified: Secondary | ICD-10-CM | POA: Diagnosis not present

## 2020-10-23 DIAGNOSIS — I5022 Chronic systolic (congestive) heart failure: Secondary | ICD-10-CM | POA: Diagnosis not present

## 2020-10-27 LAB — SUSCEPT, AFB RAPID
Amikacin: 8 ug/mL
Ceoxitin: 32 ug/mL
Ciprofloxacin: >4 ug/mL
Clarithromycin: >16 ug/mL
Doxycycline: >8 ug/mL
Imipenem: 16 ug/mL
Linezolid: 32 ug/mL
Moxifloxxacin: >4 ug/mL
Tigecycline: 0.5 ug/mL

## 2020-10-27 LAB — MYCOBACTERIA,CULT W/FLUOROCHROME SMEAR
MICRO NUMBER:: 11294112
SMEAR:: NONE SEEN
SPECIMEN QUALITY:: ADEQUATE

## 2020-10-27 LAB — CP MYCOBACTERIAL IDENTIFICATION

## 2020-10-29 DIAGNOSIS — K219 Gastro-esophageal reflux disease without esophagitis: Secondary | ICD-10-CM | POA: Diagnosis not present

## 2020-10-29 DIAGNOSIS — M109 Gout, unspecified: Secondary | ICD-10-CM | POA: Diagnosis not present

## 2020-10-29 DIAGNOSIS — E1165 Type 2 diabetes mellitus with hyperglycemia: Secondary | ICD-10-CM | POA: Diagnosis not present

## 2020-10-29 DIAGNOSIS — I2510 Atherosclerotic heart disease of native coronary artery without angina pectoris: Secondary | ICD-10-CM | POA: Diagnosis not present

## 2020-10-29 DIAGNOSIS — G894 Chronic pain syndrome: Secondary | ICD-10-CM | POA: Diagnosis not present

## 2020-10-29 DIAGNOSIS — I10 Essential (primary) hypertension: Secondary | ICD-10-CM | POA: Diagnosis not present

## 2020-10-29 DIAGNOSIS — G43009 Migraine without aura, not intractable, without status migrainosus: Secondary | ICD-10-CM | POA: Diagnosis not present

## 2020-10-29 DIAGNOSIS — E782 Mixed hyperlipidemia: Secondary | ICD-10-CM | POA: Diagnosis not present

## 2020-10-29 DIAGNOSIS — Z8701 Personal history of pneumonia (recurrent): Secondary | ICD-10-CM | POA: Diagnosis not present

## 2020-11-02 ENCOUNTER — Other Ambulatory Visit: Payer: Self-pay

## 2020-11-02 ENCOUNTER — Ambulatory Visit (HOSPITAL_COMMUNITY)
Admission: RE | Admit: 2020-11-02 | Discharge: 2020-11-02 | Disposition: A | Discharge status: Home / Self Care | Payer: Medicare HMO | Source: Ambulatory Visit | Attending: Family Medicine | Admitting: Family Medicine

## 2020-11-02 ENCOUNTER — Other Ambulatory Visit (HOSPITAL_COMMUNITY): Payer: Self-pay | Admitting: Family Medicine

## 2020-11-02 DIAGNOSIS — J069 Acute upper respiratory infection, unspecified: Secondary | ICD-10-CM | POA: Insufficient documentation

## 2020-11-02 DIAGNOSIS — R093 Abnormal sputum: Secondary | ICD-10-CM | POA: Diagnosis not present

## 2020-11-02 DIAGNOSIS — R059 Cough, unspecified: Secondary | ICD-10-CM | POA: Diagnosis not present

## 2020-11-02 DIAGNOSIS — Z9981 Dependence on supplemental oxygen: Secondary | ICD-10-CM | POA: Diagnosis not present

## 2020-11-02 DIAGNOSIS — J449 Chronic obstructive pulmonary disease, unspecified: Secondary | ICD-10-CM | POA: Diagnosis not present

## 2020-11-02 DIAGNOSIS — R0602 Shortness of breath: Secondary | ICD-10-CM | POA: Diagnosis not present

## 2020-11-02 IMAGING — DX DG CHEST 2V
2 series · 2 of 2 positions shown · non-contrast
Comparison: [DATE] CT plain film [DATE]

CLINICAL DATA: Acute respiratory disease. Possible pneumonia.
Shortness of breath. Productive cough.

EXAM:
CHEST - 2 VIEW

[chest pa]
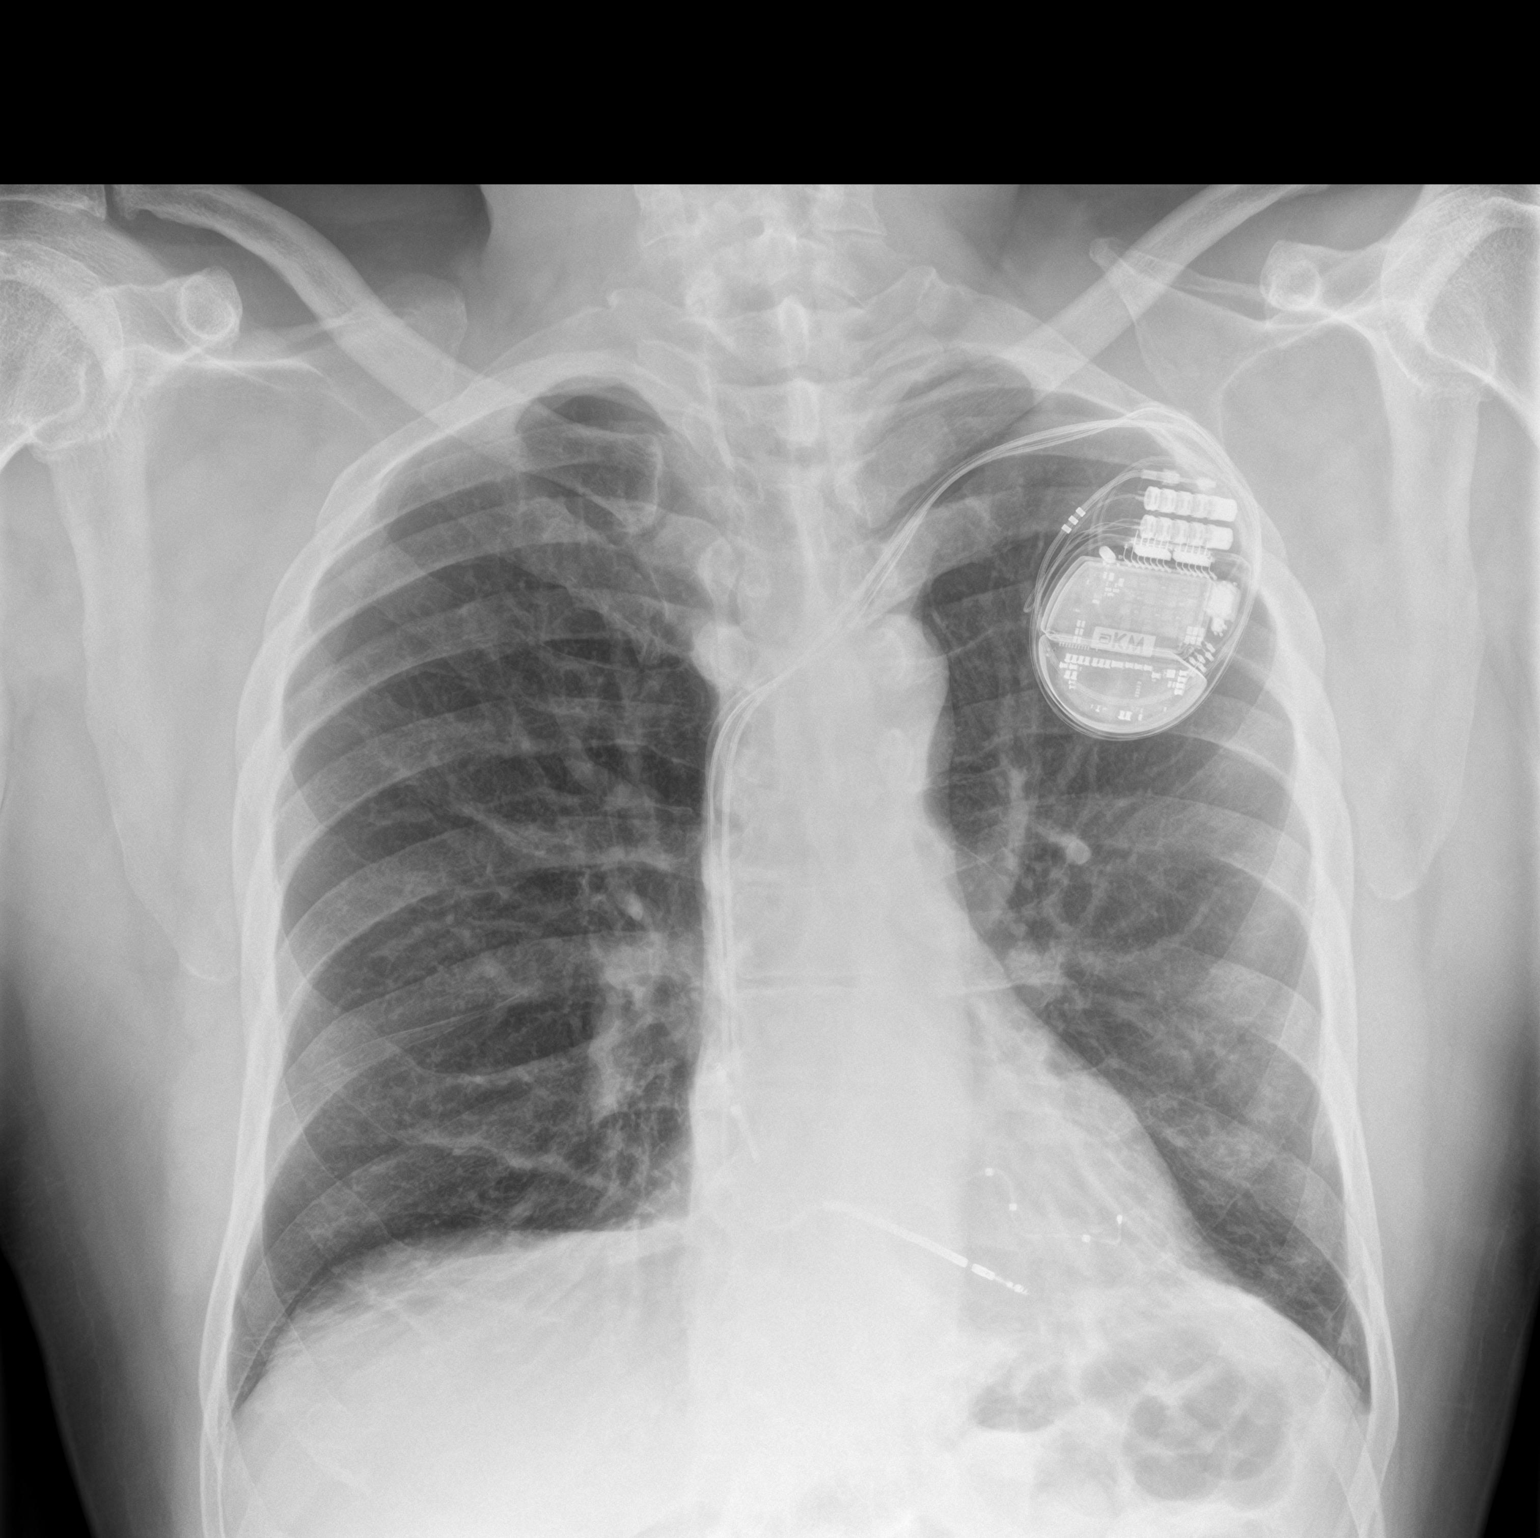

[chest lat]
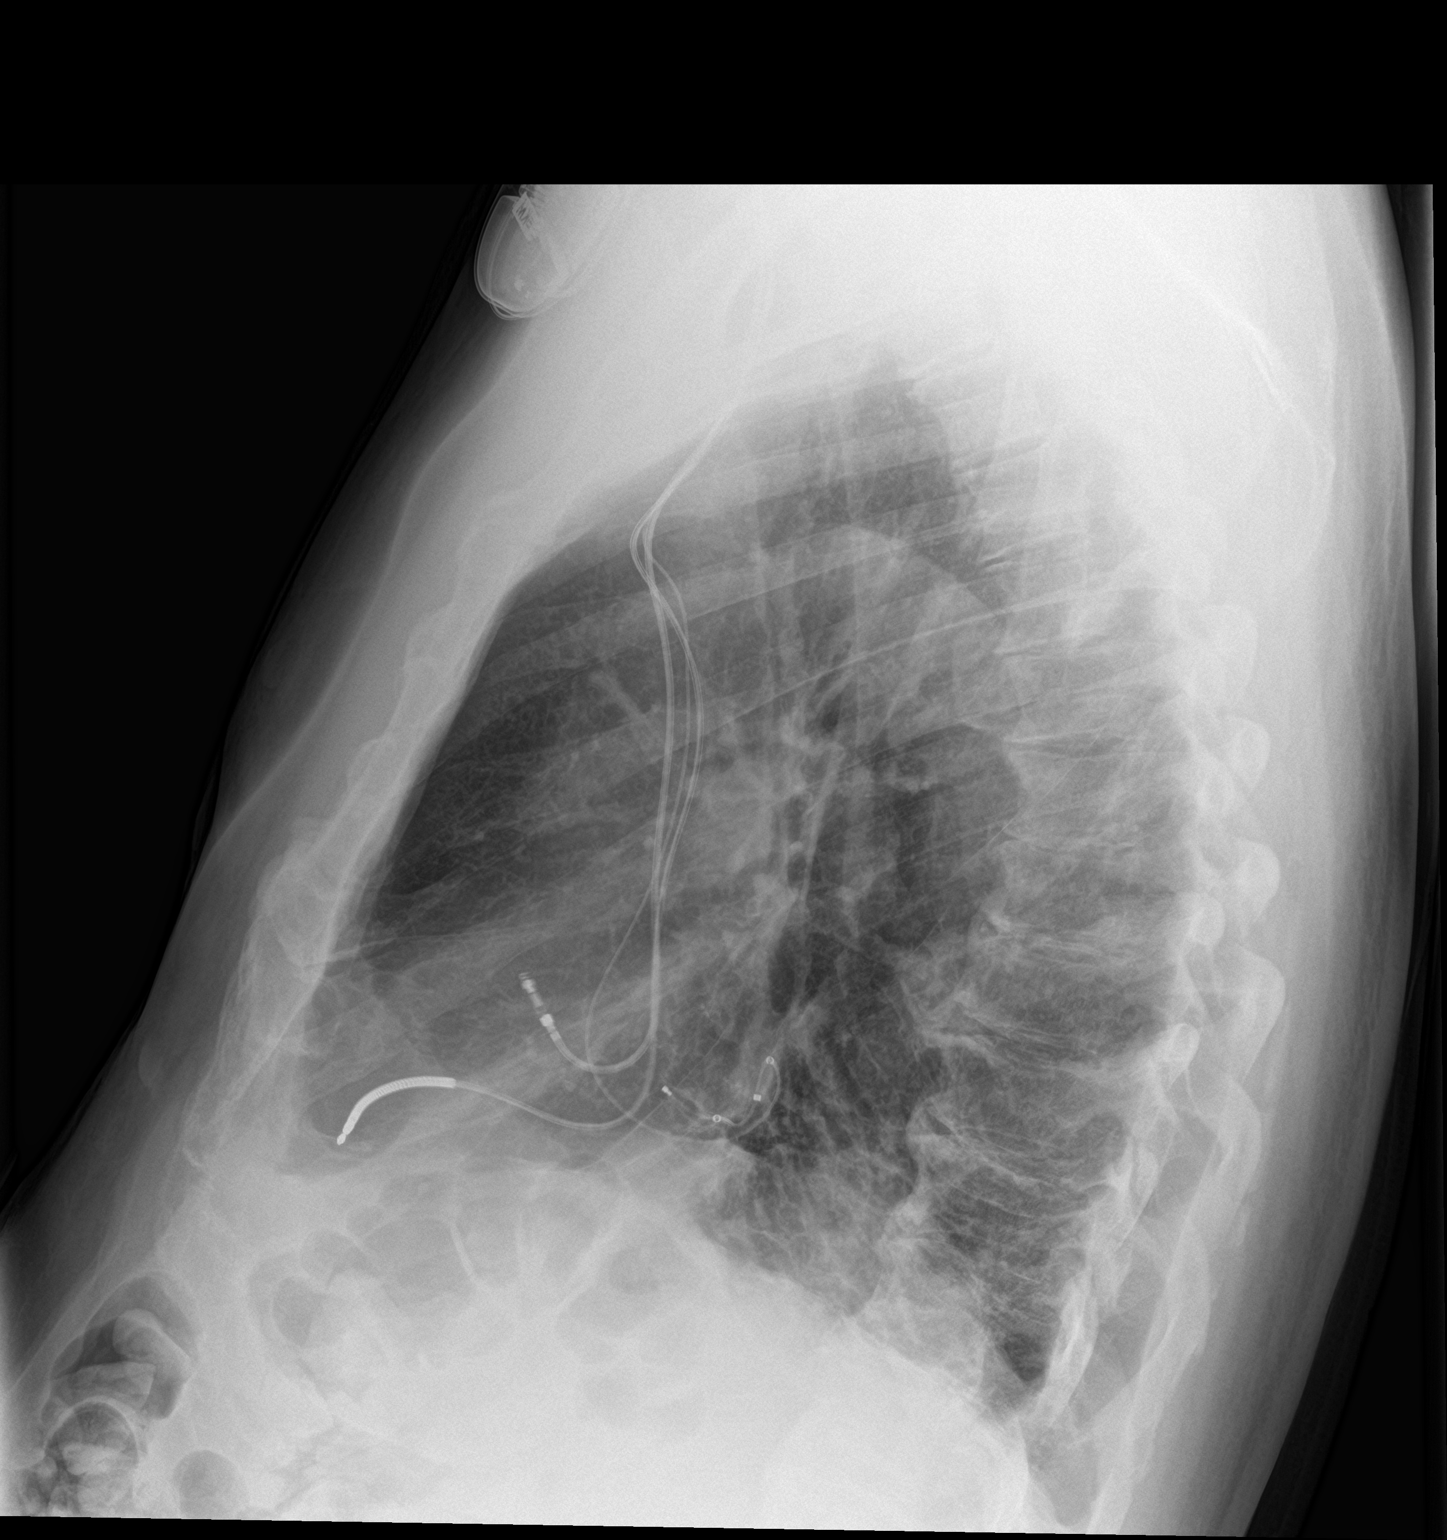

[2 of 2 positions shown; findings below may reference images not displayed]

FINDINGS: Pacer/AICD device. Midline trachea. Normal heart size.
Atherosclerosis in the transverse aorta. No pleural effusion or
pneumothorax. Left greater than right base pulmonary opacities are
similar to [DATE] and when compared to the [DATE] CT, likely
represent a combination of bibasilar bronchiectasis and left lower
lobe peribronchovascular airspace disease.
IMPRESSION: Similar bibasilar pulmonary opacities, likely the sequelae of
chronic infection or aspiration. At the left lung base, superimposed
acute or subacute infection cannot be excluded.

Aortic Atherosclerosis ([OT]-[OT]).

## 2020-11-07 ENCOUNTER — Ambulatory Visit (INDEPENDENT_AMBULATORY_CARE_PROVIDER_SITE_OTHER): Payer: Medicare HMO

## 2020-11-07 DIAGNOSIS — I428 Other cardiomyopathies: Secondary | ICD-10-CM | POA: Diagnosis not present

## 2020-11-07 NOTE — Progress Notes (Signed)
Please cancel his appointment scheduled for November 15, 2020. He has been referred to go to [facility].  Let him know to contact [facility] for follow up appointment once he has been seen at [facility].

## 2020-11-09 ENCOUNTER — Encounter: Payer: Self-pay | Admitting: Cardiology

## 2020-11-09 ENCOUNTER — Other Ambulatory Visit: Payer: Self-pay

## 2020-11-09 ENCOUNTER — Telehealth (INDEPENDENT_AMBULATORY_CARE_PROVIDER_SITE_OTHER): Payer: Medicare HMO | Admitting: Cardiology

## 2020-11-09 VITALS — BP 124/83 | HR 92 | Ht 72.0 in | Wt 236.0 lb

## 2020-11-09 DIAGNOSIS — I5022 Chronic systolic (congestive) heart failure: Secondary | ICD-10-CM

## 2020-11-09 DIAGNOSIS — I10 Essential (primary) hypertension: Secondary | ICD-10-CM

## 2020-11-09 DIAGNOSIS — I2510 Atherosclerotic heart disease of native coronary artery without angina pectoris: Secondary | ICD-10-CM | POA: Diagnosis not present

## 2020-11-09 NOTE — Patient Instructions (Signed)
Medication Instructions:  Your physician recommends that you continue on your current medications as directed. Please refer to the Current Medication list given to you today.  *If you need a refill on your cardiac medications before your next appointment, please call your pharmacy*   Lab Work: NONE   If you have labs (blood work) drawn today and your tests are completely normal, you will receive your results only by: . MyChart Message (if you have MyChart) OR . A paper copy in the mail If you have any lab test that is abnormal or we need to change your treatment, we will call you to review the results.   Testing/Procedures: NONE    Follow-Up: At [facility], you and your health needs are our priority.  As part of our continuing mission to provide you with exceptional heart care, we have created designated Provider Care Teams.  These Care Teams include your primary Cardiologist (physician) and Advanced Practice Providers (APPs -  Physician Assistants and Nurse Practitioners) who all work together to provide you with the care you need, when you need it.  We recommend signing up for the patient portal called "MyChart".  Sign up information is provided on this After Visit Summary.  MyChart is used to connect with patients for Virtual Visits (Telemedicine).  Patients are able to view lab/test results, encounter notes, upcoming appointments, etc.  Non-urgent messages can be sent to your provider as well.   To learn more about what you can do with MyChart, go to [email or web address].    Your next appointment:   6 month(s)  The format for your next appointment:   In Person  Provider:   Jonathan Branch, MD   Other Instructions Thank you for choosing [facility]!    
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
## 2020-11-10 LAB — CUP PACEART REMOTE DEVICE CHECK
Battery Remaining Longevity: 88 mo
Battery Remaining Percentage: 89 %
Battery Voltage: 2.96 V
Date Time Interrogation Session: 20220309020041
HighPow Impedance: 74 Ohm
Implantable Lead Implant Date: 20210308
Implantable Lead Implant Date: 20210308
Implantable Lead Implant Date: 20210308
Implantable Lead Location: 753858
Implantable Lead Location: 753859
Implantable Lead Location: 753860
Implantable Lead Model: 7122
Implantable Pulse Generator Implant Date: 20210308
Lead Channel Impedance Value: 390 Ohm
Lead Channel Impedance Value: 540 Ohm
Lead Channel Impedance Value: 900 Ohm
Lead Channel Pacing Threshold Amplitude: 0.625 V
Lead Channel Pacing Threshold Amplitude: 0.75 V
Lead Channel Pacing Threshold Amplitude: 1.25 V
Lead Channel Pacing Threshold Pulse Width: 0.5 ms
Lead Channel Pacing Threshold Pulse Width: 0.5 ms
Lead Channel Pacing Threshold Pulse Width: 0.5 ms
Lead Channel Sensing Intrinsic Amplitude: 11.7 mV
Lead Channel Sensing Intrinsic Amplitude: 4.2 mV
Lead Channel Setting Pacing Amplitude: 1.625
Lead Channel Setting Pacing Amplitude: 2 V
Lead Channel Setting Pacing Amplitude: 2.5 V
Lead Channel Setting Pacing Pulse Width: 0.5 ms
Lead Channel Setting Pacing Pulse Width: 0.5 ms
Lead Channel Setting Sensing Sensitivity: 0.5 mV
Pulse Gen Serial Number: 111019088

## 2020-11-11 DIAGNOSIS — J9601 Acute respiratory failure with hypoxia: Secondary | ICD-10-CM | POA: Diagnosis not present

## 2020-11-11 DIAGNOSIS — I5032 Chronic diastolic (congestive) heart failure: Secondary | ICD-10-CM | POA: Diagnosis not present

## 2020-11-11 DIAGNOSIS — I5022 Chronic systolic (congestive) heart failure: Secondary | ICD-10-CM | POA: Diagnosis not present

## 2020-11-12 DIAGNOSIS — A319 Mycobacterial infection, unspecified: Secondary | ICD-10-CM | POA: Diagnosis not present

## 2020-11-15 ENCOUNTER — Ambulatory Visit: Payer: Medicare HMO | Admitting: Infectious Diseases

## 2020-11-15 DIAGNOSIS — J449 Chronic obstructive pulmonary disease, unspecified: Secondary | ICD-10-CM | POA: Diagnosis not present

## 2020-11-15 DIAGNOSIS — J9601 Acute respiratory failure with hypoxia: Secondary | ICD-10-CM | POA: Diagnosis not present

## 2020-11-15 DIAGNOSIS — I5022 Chronic systolic (congestive) heart failure: Secondary | ICD-10-CM | POA: Diagnosis not present

## 2020-11-15 NOTE — Progress Notes (Signed)
Remote ICD transmission.   

## 2020-11-19 ENCOUNTER — Ambulatory Visit (INDEPENDENT_AMBULATORY_CARE_PROVIDER_SITE_OTHER): Payer: Medicare HMO

## 2020-11-19 DIAGNOSIS — Z95810 Presence of automatic (implantable) cardiac defibrillator: Secondary | ICD-10-CM

## 2020-11-19 DIAGNOSIS — H6122 Impacted cerumen, left ear: Secondary | ICD-10-CM | POA: Diagnosis not present

## 2020-11-19 DIAGNOSIS — E1122 Type 2 diabetes mellitus with diabetic chronic kidney disease: Secondary | ICD-10-CM | POA: Diagnosis not present

## 2020-11-19 DIAGNOSIS — Z0189 Encounter for other specified special examinations: Secondary | ICD-10-CM | POA: Diagnosis not present

## 2020-11-19 DIAGNOSIS — J069 Acute upper respiratory infection, unspecified: Secondary | ICD-10-CM | POA: Diagnosis not present

## 2020-11-19 DIAGNOSIS — R945 Abnormal results of liver function studies: Secondary | ICD-10-CM | POA: Diagnosis not present

## 2020-11-19 DIAGNOSIS — I5022 Chronic systolic (congestive) heart failure: Secondary | ICD-10-CM | POA: Diagnosis not present

## 2020-11-19 DIAGNOSIS — Z0001 Encounter for general adult medical examination with abnormal findings: Secondary | ICD-10-CM | POA: Diagnosis not present

## 2020-11-19 DIAGNOSIS — Z9981 Dependence on supplemental oxygen: Secondary | ICD-10-CM | POA: Diagnosis not present

## 2020-11-19 DIAGNOSIS — H6503 Acute serous otitis media, bilateral: Secondary | ICD-10-CM | POA: Diagnosis not present

## 2020-11-20 ENCOUNTER — Telehealth (INDEPENDENT_AMBULATORY_CARE_PROVIDER_SITE_OTHER): Payer: Medicare HMO | Admitting: Infectious Diseases

## 2020-11-20 ENCOUNTER — Encounter: Payer: Self-pay | Admitting: Infectious Diseases

## 2020-11-20 ENCOUNTER — Other Ambulatory Visit: Payer: Self-pay

## 2020-11-20 DIAGNOSIS — A319 Mycobacterial infection, unspecified: Secondary | ICD-10-CM | POA: Diagnosis not present

## 2020-11-20 DIAGNOSIS — A318 Other mycobacterial infections: Secondary | ICD-10-CM
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
## 2020-11-20 NOTE — Progress Notes (Signed)
EPIC Encounter for ICM Monitoring  Patient Name: Roger Lowe. is a 75 y.o. male Date: 11/20/2020 Primary Care Physican: [name], MD Primary Cardiologist:Branch Electrophysiologist:Taylor 1/11/2022Weight:232lbs (baseline for last 9 months)   Attempted call to patient and unable to reach.  Left message to return call. Transmission reviewed.   CorVue thoracic impedancesuggestingnormalfluid levels.  Prescribed: Furosemide40 mg take 1 tablet daily.  Labs: 08/03/2020 Creatinine 1.05, BUN 11, Potassium 4.2, Sodium 138 06/01/2020 Creatinine1.28, BUN15, Potassium3.9, Sodium136, GFR56->60 03/20/2020 Creatinine1.17, BUN6, Potassium 3.9, Sodium139, GFR>60  02/16/2020 Creatinine1.23, BUN12, Potassium4.2, [id number], GFR58->60 A complete set of results can be found in Results Review.  Recommendations:Unable to reach.    Follow-up plan: ICM clinic phone appointment on4/25/2022. 91 day device clinic remote transmission6/04/2021.   EP/Cardiology Office Visits: Recall 6/17/2022with Dr. Lovena Le.   Copy of ICM check sent to Dr.Taylor  3 month ICM trend: 11/19/2020.    1 Year ICM trend:       [name], RN 11/20/2020 12:39 PM

## 2020-11-21 DIAGNOSIS — K219 Gastro-esophageal reflux disease without esophagitis: Secondary | ICD-10-CM | POA: Diagnosis not present

## 2020-11-21 DIAGNOSIS — Z8701 Personal history of pneumonia (recurrent): Secondary | ICD-10-CM | POA: Diagnosis not present

## 2020-11-21 DIAGNOSIS — Z950 Presence of cardiac pacemaker: Secondary | ICD-10-CM | POA: Diagnosis not present

## 2020-11-21 DIAGNOSIS — Z8679 Personal history of other diseases of the circulatory system: Secondary | ICD-10-CM | POA: Diagnosis not present

## 2020-11-21 DIAGNOSIS — I10 Essential (primary) hypertension: Secondary | ICD-10-CM | POA: Diagnosis not present

## 2020-11-21 DIAGNOSIS — I25119 Atherosclerotic heart disease of native coronary artery with unspecified angina pectoris: Secondary | ICD-10-CM | POA: Diagnosis not present

## 2020-11-21 DIAGNOSIS — E1165 Type 2 diabetes mellitus with hyperglycemia: Secondary | ICD-10-CM | POA: Diagnosis not present

## 2020-11-21 DIAGNOSIS — Z9981 Dependence on supplemental oxygen: Secondary | ICD-10-CM | POA: Diagnosis not present

## 2020-11-21 DIAGNOSIS — N182 Chronic kidney disease, stage 2 (mild): Secondary | ICD-10-CM | POA: Diagnosis not present

## 2020-11-28 DIAGNOSIS — I10 Essential (primary) hypertension: Secondary | ICD-10-CM | POA: Diagnosis not present

## 2020-11-28 DIAGNOSIS — E1165 Type 2 diabetes mellitus with hyperglycemia: Secondary | ICD-10-CM | POA: Diagnosis not present

## 2020-11-30 DIAGNOSIS — J449 Chronic obstructive pulmonary disease, unspecified: Secondary | ICD-10-CM | POA: Diagnosis not present

## 2020-12-03 DIAGNOSIS — J449 Chronic obstructive pulmonary disease, unspecified: Secondary | ICD-10-CM | POA: Diagnosis not present

## 2020-12-12 DIAGNOSIS — I5032 Chronic diastolic (congestive) heart failure: Secondary | ICD-10-CM | POA: Diagnosis not present

## 2020-12-12 DIAGNOSIS — J9601 Acute respiratory failure with hypoxia: Secondary | ICD-10-CM | POA: Diagnosis not present

## 2020-12-12 DIAGNOSIS — I5022 Chronic systolic (congestive) heart failure: Secondary | ICD-10-CM | POA: Diagnosis not present

## 2020-12-14 DIAGNOSIS — J449 Chronic obstructive pulmonary disease, unspecified: Secondary | ICD-10-CM | POA: Diagnosis not present

## 2020-12-14 DIAGNOSIS — I5022 Chronic systolic (congestive) heart failure: Secondary | ICD-10-CM | POA: Diagnosis not present

## 2020-12-14 DIAGNOSIS — J9601 Acute respiratory failure with hypoxia: Secondary | ICD-10-CM | POA: Diagnosis not present

## 2020-12-17 ENCOUNTER — Other Ambulatory Visit: Payer: Self-pay | Admitting: Primary Care

## 2020-12-18 ENCOUNTER — Ambulatory Visit: Payer: Medicare HMO | Admitting: Urology

## 2020-12-21 ENCOUNTER — Other Ambulatory Visit: Payer: Self-pay | Admitting: Student

## 2020-12-21 NOTE — Telephone Encounter (Signed)
This is a [place] pt, Dr. Harl Bowie

## 2020-12-24 ENCOUNTER — Telehealth: Payer: Self-pay

## 2020-12-24 ENCOUNTER — Ambulatory Visit (INDEPENDENT_AMBULATORY_CARE_PROVIDER_SITE_OTHER): Payer: Medicare HMO

## 2020-12-24 DIAGNOSIS — Z95810 Presence of automatic (implantable) cardiac defibrillator: Secondary | ICD-10-CM | POA: Diagnosis not present

## 2020-12-24 DIAGNOSIS — I5022 Chronic systolic (congestive) heart failure: Secondary | ICD-10-CM

## 2020-12-24 NOTE — Progress Notes (Signed)
EPIC Encounter for ICM Monitoring  Patient Name: Roger Lowe. is a 75 y.o. male Date: 12/24/2020 Primary Care Physican: [name], MD Primary Cardiologist:Branch Electrophysiologist:Taylor 1/11/2022Weight:232lbs (baseline)   Attempted call to patient and unable to reach.  Left message to return call. Transmission reviewed.   CorVue thoracic impedancesuggestingpossible fluid accumulation starting 12/21/2020.  Prescribed:  Furosemide40 mg take 1 tablet daily. Spironolactone 25 mg take 1 tablet daily  Labs: 09/07/2020 Creatinine 1.15, BUN 8,   Potassium 3.9, Sodium 139, GFR 63-73 08/03/2020 Creatinine 1.05, BUN 11, Potassium 4.2, Sodium 138 06/01/2020 Creatinine1.28, BUN15, Potassium3.9, Sodium136, GFR56->60 03/20/2020 Creatinine1.17, BUN6, Potassium 3.9, Sodium139, GFR>60  02/16/2020 Creatinine1.23, BUN12, Potassium4.2, [id number], GFR58->60 A complete set of results can be found in Results Review.  Recommendations:Unable to reach.  Will advise patient to take extra Furosemide if patient returns call.  Follow-up plan: ICM clinic phone appointment on5/10/2020 to recheck fluid levels. 91 day device clinic remote transmission6/04/2021.   EP/Cardiology Office Visits: Recall 6/17/2022with Dr. Lovena Le.   Copy of ICM check sent to Dr.Taylor and Dr Harl Bowie for review.   3 month ICM trend: 12/24/2020.    1 Year ICM trend:       [name], RN 12/24/2020 12:35 PM

## 2020-12-24 NOTE — Telephone Encounter (Signed)
Remote ICM transmission received.  Attempted call to patient regarding ICM remote transmission and left message to return call   

## 2020-12-25 NOTE — Progress Notes (Signed)
Attempted 2nd ICM call and no answer.

## 2020-12-27 NOTE — Progress Notes (Signed)
LM to call back X 2

## 2020-12-27 NOTE — Progress Notes (Signed)
Pt c/o swelling in legs and hands with SOB on exertion - said his weight yesterday was 234lbs and has gained about 10lbs over the last week or so

## 2020-12-30 DIAGNOSIS — R220 Localized swelling, mass and lump, head: Secondary | ICD-10-CM | POA: Diagnosis not present

## 2020-12-30 DIAGNOSIS — K219 Gastro-esophageal reflux disease without esophagitis: Secondary | ICD-10-CM | POA: Diagnosis not present

## 2020-12-30 DIAGNOSIS — J069 Acute upper respiratory infection, unspecified: Secondary | ICD-10-CM | POA: Diagnosis not present

## 2020-12-30 DIAGNOSIS — E1165 Type 2 diabetes mellitus with hyperglycemia: Secondary | ICD-10-CM | POA: Diagnosis not present

## 2020-12-30 DIAGNOSIS — R600 Localized edema: Secondary | ICD-10-CM | POA: Diagnosis not present

## 2020-12-31 ENCOUNTER — Other Ambulatory Visit: Payer: Self-pay

## 2020-12-31 MED ORDER — SPIRIVA RESPIMAT 2.5 MCG/ACT IN AERS
2.0000 | INHALATION_SPRAY | Freq: Every day | RESPIRATORY_TRACT | 3 refills | Status: DC
Start: 2020-12-31 — End: 2021-10-24
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
## 2021-01-01 ENCOUNTER — Ambulatory Visit (INDEPENDENT_AMBULATORY_CARE_PROVIDER_SITE_OTHER): Payer: Medicare HMO

## 2021-01-01 ENCOUNTER — Telehealth (HOSPITAL_COMMUNITY): Payer: Self-pay | Admitting: *Deleted

## 2021-01-01 ENCOUNTER — Encounter: Payer: Self-pay | Admitting: Primary Care

## 2021-01-01 ENCOUNTER — Other Ambulatory Visit: Payer: Self-pay

## 2021-01-01 ENCOUNTER — Ambulatory Visit (INDEPENDENT_AMBULATORY_CARE_PROVIDER_SITE_OTHER): Payer: Medicare HMO | Admitting: Primary Care

## 2021-01-01 VITALS — BP 128/70 | HR 94 | Temp 97.5°F | Ht 72.0 in | Wt 239.6 lb

## 2021-01-01 DIAGNOSIS — J9611 Chronic respiratory failure with hypoxia: Secondary | ICD-10-CM

## 2021-01-01 DIAGNOSIS — J418 Mixed simple and mucopurulent chronic bronchitis: Secondary | ICD-10-CM

## 2021-01-01 DIAGNOSIS — J449 Chronic obstructive pulmonary disease, unspecified: Secondary | ICD-10-CM | POA: Diagnosis not present

## 2021-01-01 DIAGNOSIS — J189 Pneumonia, unspecified organism: Secondary | ICD-10-CM | POA: Diagnosis not present

## 2021-01-01 DIAGNOSIS — A318 Other mycobacterial infections: Secondary | ICD-10-CM

## 2021-01-01 DIAGNOSIS — I5022 Chronic systolic (congestive) heart failure: Secondary | ICD-10-CM

## 2021-01-01 DIAGNOSIS — A319 Mycobacterial infection, unspecified: Secondary | ICD-10-CM

## 2021-01-01 DIAGNOSIS — Z95810 Presence of automatic (implantable) cardiac defibrillator: Secondary | ICD-10-CM

## 2021-01-01 DIAGNOSIS — J9811 Atelectasis: Secondary | ICD-10-CM | POA: Diagnosis not present

## 2021-01-01 IMAGING — DX DG CHEST 2V
2 series · 2 of 2 positions shown · non-contrast
Comparison: Chest x-ray [DATE].

CLINICAL DATA: Follow-up left-sided pneumonia.

EXAM:
CHEST - 2 VIEW

[chest pa]
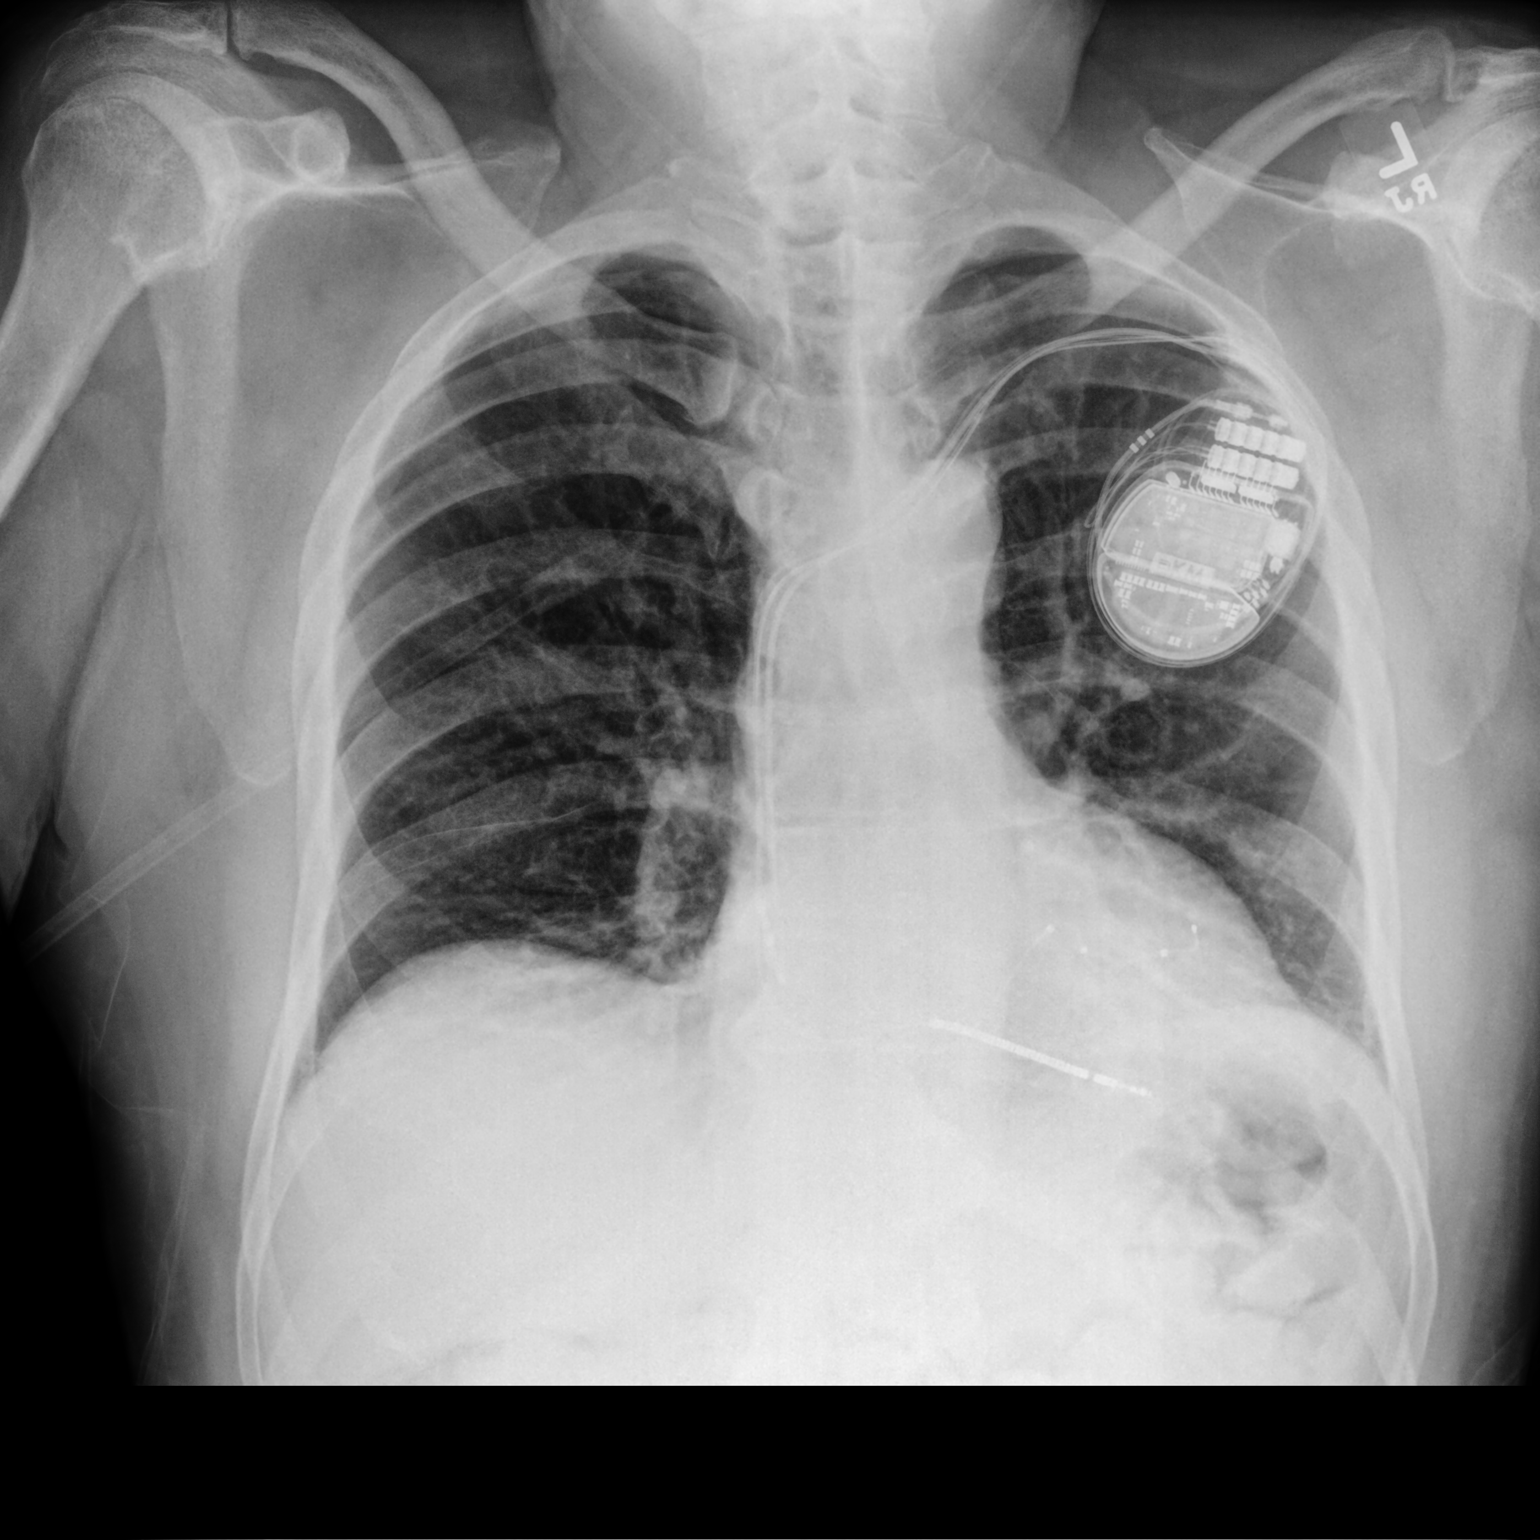

[chest lat]
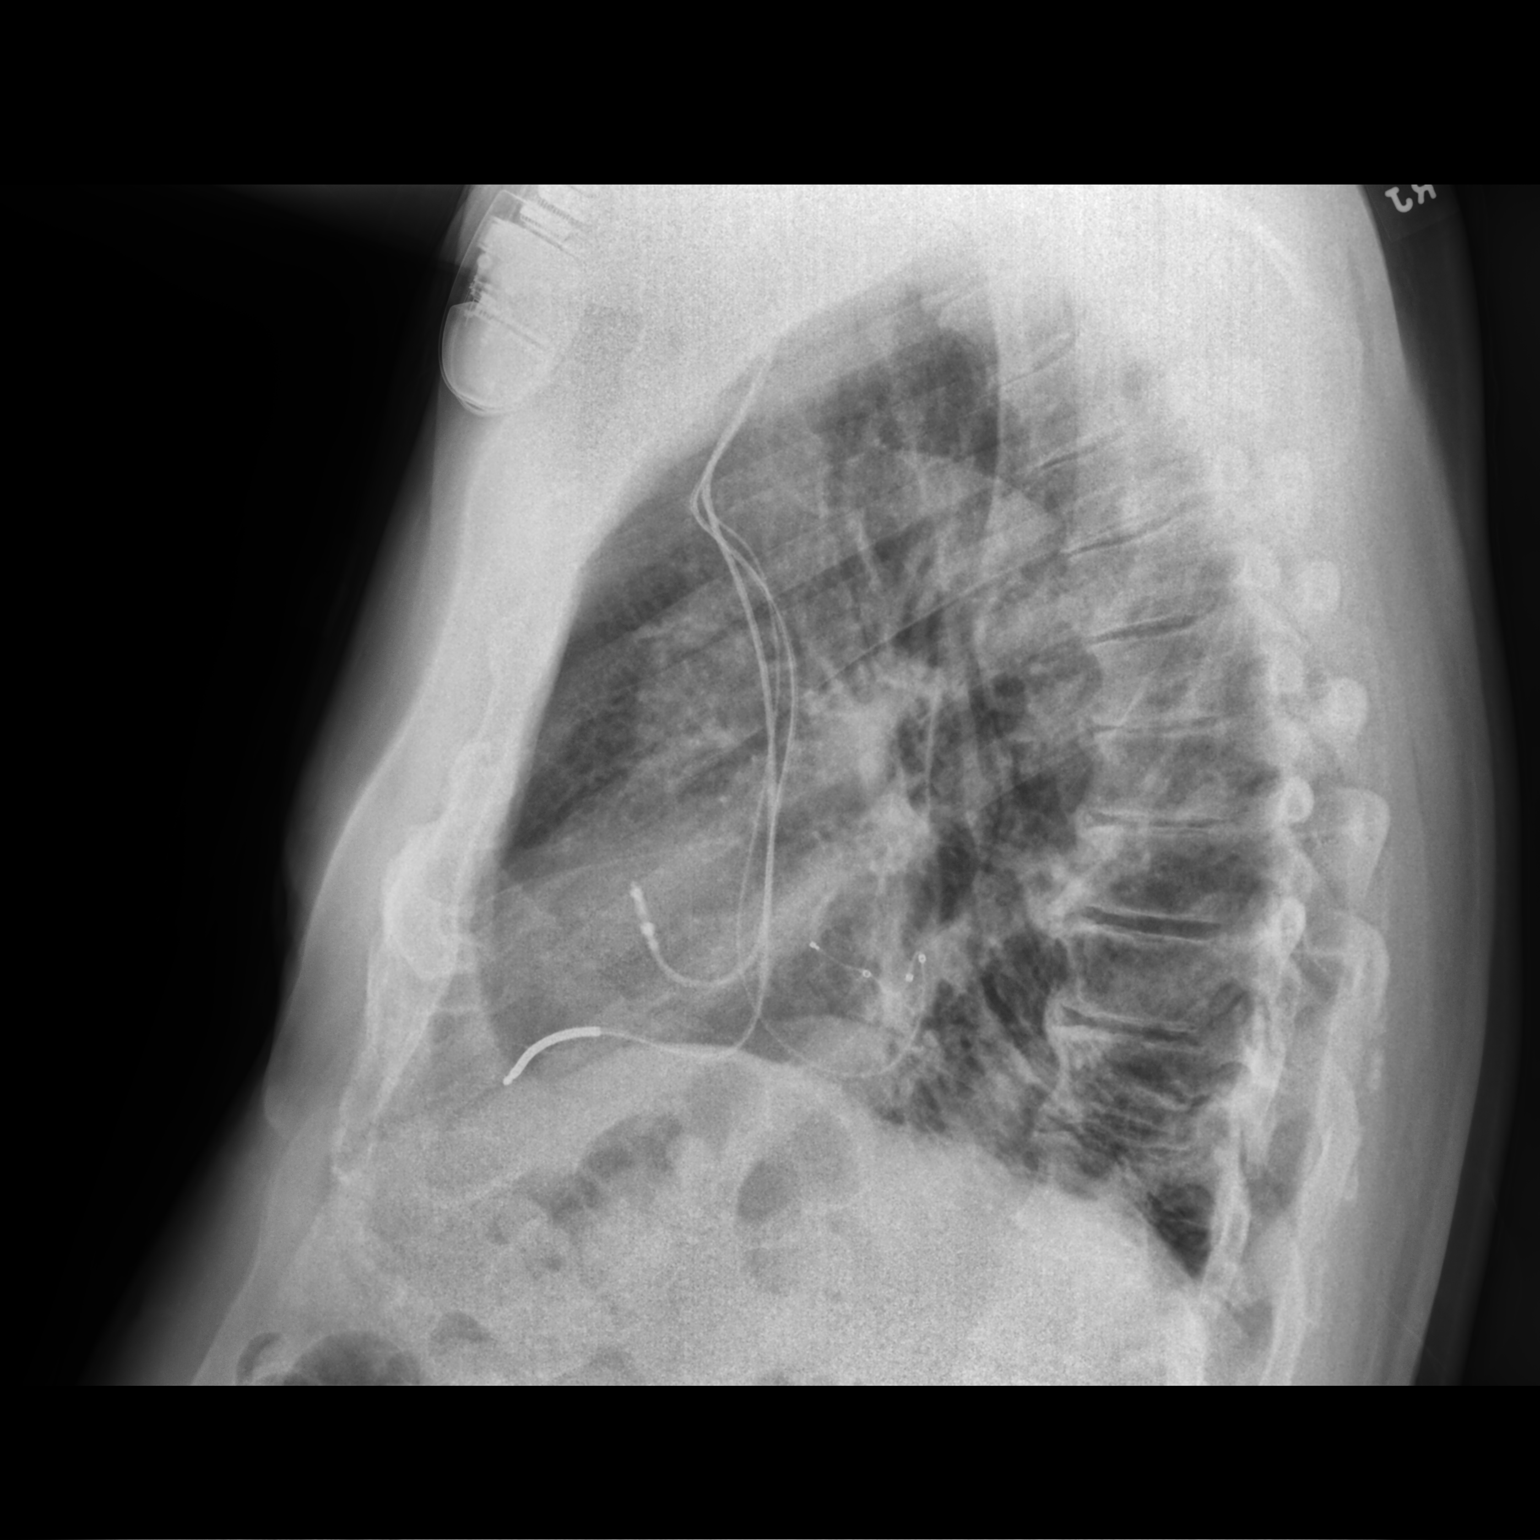

[2 of 2 positions shown; findings below may reference images not displayed]

FINDINGS: AICD noted in stable position. Heart size stable. Low lung volumes
with mild bibasilar atelectasis again noted. Mild left base
infiltrate again cannot be excluded. No interim change in
appearance. Mild new infiltrate in the right mid lung cannot be
excluded on today's exam. No pneumothorax.
IMPRESSION: 1.  AICD noted stable position.  Heart size stable.

2. Low lung volumes with mild bibasilar atelectasis again noted.
Mild left base infiltrate again cannot be excluded. No interim
change in appearance. Mild new infiltrate right mid lung cannot be
excluded.

## 2021-01-01 NOTE — Assessment & Plan Note (Addendum)
-   Reinforced importance of using Brovana/Pulmicort nebulizer twice daily  - Continue Azithromycin 250mg  MWF for prevention COPD exacerbations  - Referring to pulmonary rehab

## 2021-01-01 NOTE — Assessment & Plan Note (Addendum)
-   Recurrent PNA. Recently treated for CAP by PCP with Levaquin. Continues to have purulent cough - Recommend patient use therapy vest 1-2 times every day - Referring to speech therapy for hx aspiration/dysphagia  - Checking repeat CXR today

## 2021-01-01 NOTE — Progress Notes (Signed)
EPIC Encounter for ICM Monitoring  Patient Name: Roger Lowe. is a 75 y.o. male Date: 01/01/2021 Primary Care Physican: [name], MD Primary Cardiologist:Branch Electrophysiologist:Taylor 12/27/2020 Weight: 234 lbs  5/3/2022Weight:240lbs   Spoke with patient.  He reports weight continues to increase but weight is checked later in the day with clothes on so difficult to know how accurate it is for fluid weight.   Leg and hand swelling have improved but not resolved.  He states he feels okay.   CorVue thoracic impedancesuggestingfluid levels have returned to normal.  Prescribed:  Furosemide40 mg take 1 tablet daily. Spironolactone 25 mg take 1 tablet daily  Labs: 09/07/2020 Creatinine 1.15, BUN 8,   Potassium 3.9, Sodium 139, GFR 63-73 08/03/2020 Creatinine 1.05, BUN 11, Potassium 4.2, Sodium 138 06/01/2020 Creatinine1.28, BUN15, Potassium3.9, Sodium136, GFR56->60 03/20/2020 Creatinine1.17, BUN6, Potassium 3.9, Sodium139, GFR>60  02/16/2020 Creatinine1.23, BUN12, Potassium4.2, [id number], GFR58->60 A complete set of results can be found in Results Review.  Recommendations: Recommendation to limit salt intake.  Encouraged to call if experiencing any fluid symptoms.   Follow-up plan: ICM clinic phone appointment on5/31/022. 91 day device clinic remote transmission6/04/2021.   EP/Cardiology Office Visits: Recall 6/17/2022with Dr. Lovena Le.   Copy of ICM check sent to Dr.Taylor and Dr Harl Bowie to show updated report that fluid levels suggesting returning to normal.    3 month ICM trend: 01/01/2021.    1 Year ICM trend:       [name], RN 01/01/2021 1:45 PM

## 2021-01-01 NOTE — Patient Instructions (Addendum)
Recommendations: - Ensure you use Pulmicort/Brovana twice a day  - Continue Azithromycin MWF - Resume Therapy vest 1-2 times a day minimum - Use 3-4L oxygen, titrate to keep O2 between 88-92%  - Wear oxygen when showering  - Read and follow aspiration precautions   Referral: - Speech and swallow re: aspiration  - Pulmonary rehab re: COPD GOLD 3 - CXR (ordered)  Follow-up: - 3 months with Dr. Valeta Harms      Aspiration Precautions, Adult Aspiration is the breathing in (inhalation) of a liquid or other material into the lungs. Adults with neurologicalimpairments, swallowing difficulties (dysphagia), decreased gag reflex, or impaired physical mobility are at risk for aspiration. Things that can be inhaled into the lungs include:  Food.  Any type of liquid, such as drinks or saliva.  Stomach contents, such as vomit or stomach acid. Aspiration can cause pneumonia. You can take steps to reduce the risk of aspiration. What are the signs of aspiration? Signs of aspiration include:  Coughing: ? Coughing after swallowing food or liquids. ? Coughing up phlegm (sputum) that:  Is yellow, tan, or green.  Has pieces of food in it.  Smells bad. ? Coughing when lying down or having to sit up quickly after lying down. ? Having a hoarse, barky cough.  Trouble breathing. This may include: ? Breathing quickly. ? Breathing very slowly. ? Loud breathing. ? Rumbling sounds from the lungs while breathing.  Eating troubles, such as: ? Clearing the throat often while eating. ? Drooling while eating. ? A feeling of fullness in the throat or a feeling that something is stuck in the throat. ? Having a runny nose while eating. ? Watery eyes while eating. ? A pained look on the face while eating.  Speaking problems such as: ? Not being able to speak. ? A hoarse voice.  Choking often.  A change in skin color. The skin may look red or blue.  Fever.  Pain in the chest or back. What are  the complications of aspiration? Complications of aspiration include:  Losing weight because the person is not absorbing needed nutrients.  Loss of enjoyment and the social benefits of eating.  Choking.  Lung irritation, if someone aspirates acidic food or drinks.  Lung infection (pneumonia).  Lung abscess, which is a collection of infected liquid (pus) in the lungs. In serious cases, death can occur. What can I do to prevent aspiration? Caring for someone who has a feeding tube If you are caring for someone who has a feeding tube and cannot eat or drink safely through his or her mouth:  Keep the person in an upright position as much as possible.  Do not lay the person flat if he or she is getting continuous feedings. If you need to lay the person flat for any reason, turn the feeding pump off.  Check feeding tube residuals as told by the health care provider. Ask the health care provider what residual amount is too high. Caring for someone who can eat and drink safely by mouth If you are caring for someone who can eat and drink safely by mouth:  Have the person sit in an upright position when eating food or drinking fluids. This can be done in two ways: ? Have the person sit up in a chair. ? If sitting in a chair is not possible, position the person in bed so he or she is upright.  Remind the person to eat slowly and chew well. Make sure the person  is awake and alert while eating. Never put food or liquids in the mouth of a person who is not fully alert.  Do not distract the person. This is especially important for people with thinking or memory (cognitive) problems.  Allow foods to cool. Hot foods may be more difficult to swallow.  Provide small meals more frequently instead of three large meals. This may reduce fatigue during eating.  After the person has finishing eating: ? Check the person's mouth thoroughly for leftover food. ? Keep the person sitting upright for 30-45  minutes.  Do not serve food or drink within 2 hours of bedtime. General instructions Follow these general guidelines to prevent aspiration in someone who can eat and drink safely by mouth:  Feed small amounts of food. Do not force-feed.  For a person who is on a diet for swallowing difficulty (dysphagia diet), follow the recommended food and drink consistency. For example, you may be told to thicken a liquid using a commercial food thickening product.  Use as little water as possible when brushing the person's teeth or cleaning his or her mouth.  Provide oral care before and after meals.  Use adaptive devices, such as cut-out cups or other utensils, as told by the health care provider.  Crush pills and put them in soft food such as pudding or ice cream. Some pills should not be crushed. Check with the health care provider before crushing any medicine.  If someone is choking on food or an object, perform the Heimlich maneuver (abdominal thrusts).   Contact a health care provider if the person:  Has a feeding tube, and the feeding tube residual amount is too high.  Has a fever.  Tries to avoid food or water, such as refusing to eat, drink, or be fed, or is eating less than normal.  May have aspirated food or liquid.  Is showing warning signs, such as choking or coughing, when he or she eats or drinks. Get help right away if the person:  Has trouble breathing or starts to breathe quickly.  Is breathing very slowly or stops breathing.  Coughs a lot after eating or drinking.  Has a long-lasting (chronic) cough.  Coughs up thick, yellow, or tan phlegm.  Has symptoms of pneumonia, such as: ? Coughing a lot. ? Coughing up mucus with a bad smell or blood in it. ? Feeling short of breath. ? Complaining of chest pain. ? Sweating, fever, and chills. ? Feeling tired. ? Complaining of trouble breathing. ? Wheezing.  Cannot stop choking.  Is unable to breathe, turns blue,  faints, or seems confused. These symptoms may represent a serious problem that is an emergency. Do not wait to see if the symptoms will go away. Get medical help right away. Call your local emergency services (911 in the [place]  Summary  Aspiration is the breathing in (inhalation) of a liquid or material into the lungs. Things that can be inhaled into the lungs include food, liquids, saliva, or stomach contents.  Aspiration can cause pneumonia or choking.  One sign of aspiration is coughing after swallowing food or liquids.  Contact your health care provider if you notice signs of aspiration. This information is not intended to replace advice given to you by your health care provider. Make sure you discuss any questions you have with your health care provider. Document Revised: 04/24/2020 Document Reviewed: 08/22/2019 Elsevier Patient Education  [place] Oxygen Use, Adult When a medical condition  keeps you from getting enough oxygen, your health care provider may instruct you to take extra oxygen at home. Your health care provider will let you know:  When to take oxygen.  How long to take oxygen.  How quickly oxygen should be delivered (flow rate), in liters per minute (LPM or L/M). Home oxygen can be given through:  A mask.  A nasal cannula. This is a device or tube that goes in the nostrils.  A transtracheal catheter. This is a small, thin tube placed in the windpipe (trachea).  A breathing tube (tracheostomy tube) that is surgically placed in the windpipe. This may be used in severe cases. These devices are connected with tubing to an oxygen source, such as:  A tank. Tanks hold oxygen in gas form. They must be replaced when the oxygen is used up.  A liquid oxygen device. This holds oxygen in liquid form. Liquid oxygen is very cold. It must be replaced when the oxygen is used up.  An oxygen concentrator machine. This filters oxygen in the room. There are  two types of oxygen concentrator machines--stationary and portable. ? A stationary oxygen concentrator machine plugs into the main electricity supply at your home. You must have a backup cylinder of oxygen in case the power goes out. ? A portable oxygen concentrator machine is smaller in size and more lightweight. This machine uses battery supply and can be used outside the home. Work with your health care provider to find equipment that works best for you and your lifestyle. What are the risks? Delivery of supplemental oxygen is generally safe. However, some risks include:  Fire. This can happen if the oxygen is exposed to a heat source, flame, or spark.  Injury to skin. This can happen if liquid oxygen touches your skin.  Damage to the lungs or other organs. This can happen from getting too little or too much oxygen. Supplies needed: To use oxygen, you will need:  A mask, nasal cannula, transtracheal catheter, or tracheostomy.  An oxygen tank, a liquid oxygen device, or an oxygen concentrator.  The tape that your health care provider recommends (optional). Your health care provider may also recommend:  A humidifier to warm and moisten the oxygen delivered. This will depend on how much oxygen you need and the type of home oxygen device you use.  A pulse oximeter. This device measures the percentage of oxygen in your blood. How to use oxygen Your health care provider or a person from your [place] will show you how to use your oxygen device. Follow his or her instructions. The instructions may look something like this: 1. Wash your hands with soap and water. 2. If you use an oxygen concentrator, make sure it is plugged in. 3. Place one end of the tube into the port on the tank, device, or machine. 4. Place the mask over your nose and mouth. Or, place the nasal cannula and secure it with tape if instructed. If you use a tracheostomy or transtracheal catheter, connect it to  the oxygen source as directed. 5. Make sure the liter-flow setting on the machine is at the level prescribed by your health care provider. 6. Turn on the machine or adjust the knob on the tank or device to the correct liter-flow setting. 7. When you are done, turn off and unplug the machine, or turn the knob to OFF.   How to clean and care for the oxygen supplies Nasal cannula  Clean it with a  warm, wet cloth daily or as needed.  Wash it with a liquid soap once a week.  Rinse it thoroughly once or twice a week.  Air-dry it.  Replace it every 2-4 weeks.  If you have an infection, such as a cold or pneumonia, change the cannula when you get better. Mask  Replace it every 2-4 weeks.  If you have an infection, such as a cold or pneumonia, change the mask when you get better. Humidifier bottle  Wash the bottle between each refill: ? Wash it with soap and warm water. ? Rinse it thoroughly. ? Clean it and its top with a disinfectant cleaner. ? Air-dry it. ? Make sure it is dry before you refill it. Oxygen concentrator  Clean the air filter at least twice a week according to directions from your home medical equipment and service company.  Wipe down the cabinet every day. To do this: ? Unplug the unit. ? Wipe down the cabinet with a damp cloth. ? Dry the cabinet. Other equipment  Change any extra tubing every 1-3 months.  Follow instructions from your health care provider about taking care of any other equipment. Safety tips Fire safety tips  Keep your oxygen and oxygen supplies at least 6 ft (2 m) away from sources of heat, flames, and sparks at all times.  Do not allow smoking near your oxygen. Put up "no smoking" signs in your home. Avoid smoking areas when in public.  Do not use materials that can burn (are flammable) while you use oxygen. This includes: ? Petroleum jelly. ? Hair spray or other aerosol sprays. ? Rubbing alcohol. ? Hand sanitizer.  When you go to a  restaurant with portable oxygen, ask to be seated in the non-smoking section.  Keep a Data processing manager close by. Let your fire department know that you have oxygen in your home.  Test your home smoke detectors regularly.   Traveling  Secure your oxygen tank in the vehicle so that it does not move around. Follow instructions from your medical device company about how to safely secure your tank.  Make sure you have enough oxygen for the amount of time you will be away from home.  If you are planning to travel by public transportation (airplane, train, bus, or boat), contact the company to find out if it allows the use of an approved portable oxygen concentrator. You may also need documents from your health care provider and medical device company before you travel. General safety tips  If you use an oxygen cylinder, make sure it is in a stand or secured to an object that will not move (fixed object).  If you use liquid oxygen, make sure its container is kept upright at all times.  If you use an oxygen concentrator: ? Dance movement psychotherapist company. Make sure you are given priority service in the event that your power goes out. ? Avoid using extension cords if possible. Follow these instructions at home:  Use oxygen only as told by your health care provider.  Do not use alcohol or other drugs that make you relax (sedating drugs) unless instructed. They can slow down your breathing rate and make it hard to get in enough oxygen.  Know how and when to order a refill of oxygen.  Always keep a spare tank of oxygen. Plan ahead for holidays when you may not be able to get a prescription filled.  Use water-based lubricants on your lips or nostrils. Do not use oil-based products  like petroleum jelly.  To prevent skin irritation on your cheeks or behind your ears, tuck some gauze under the tubing. Where to find more information  American Lung Association: DiabeticMale.de Contact a health  care provider if:  You get headaches often.  You have a lasting cough.  You are restless or have anxiety.  You develop an illness that affects your breathing.  You cannot exercise at your regular level.  You have a fever.  You have persistent redness under your nose. Get help right away if:  You are confused.  You are sleepy all the time.  You have blue lips or fingernails.  You have difficult or irregular breathing that is getting worse.  You are struggling to breathe. These symptoms may represent a serious problem that is an emergency. Do not wait to see if the symptoms will go away. Get medical help right away. Call your local emergency services (911 in the [place] Do not drive yourself to the hospital. Summary  Your health care provider or a person from your [place] will show you how to use your oxygen device. Follow his or her instructions.  If you use an oxygen concentrator, make sure it is plugged in.  Make sure the liter-flow setting on the machine is at the level prescribed by your health care provider.  Use oxygen only as told by your health care provider.  Keep your oxygen and oxygen supplies at least 6 ft (2 m) away from sources of heat, flames, and sparks at all times. This information is not intended to replace advice given to you by your health care provider. Make sure you discuss any questions you have with your health care provider. Document Revised: 10/20/2019 Document Reviewed: 08/16/2019 Elsevier Patient Education  2021 [employer].

## 2021-01-01 NOTE — Assessment & Plan Note (Signed)
-   Following with Infectious disease. 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

## 2021-01-01 NOTE — Telephone Encounter (Signed)
Received referral from Dr. Valeta Harms for pulmonary rehab. Noted on review that pt resides in [place]. Called and left message inquiring his preference in location. [facility] verses [facility]. Call back phone number provided. Cherre Huger, BSN Cardiac and Training and development officer

## 2021-01-01 NOTE — Assessment & Plan Note (Addendum)
-   Continue 3-4L oxygen, goal O2 88-92% - Advised patient to use oxygen while showering

## 2021-01-01 NOTE — Assessment & Plan Note (Deleted)
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

## 2021-01-02 ENCOUNTER — Telehealth (HOSPITAL_COMMUNITY): Payer: Self-pay | Admitting: *Deleted

## 2021-01-02 NOTE — Telephone Encounter (Signed)
Roger Lowe returned call and would like to participate at [facility] for Pulmonary rehab.  Will route his pulmonary rehab referral to [facility] Pulmonary rehab staff. Cherre Huger, BSN Cardiac and Training and development officer

## 2021-01-03 NOTE — Progress Notes (Signed)
PCCM:  Thanks for seeing him.   Garner Nash, DO [facility] 01/03/2021 7:40 PM

## 2021-01-04 NOTE — Progress Notes (Signed)
Discussed imaging findings with Dr. Valeta Harms, he recommended holding off on further abx.

## 2021-01-06 DIAGNOSIS — J449 Chronic obstructive pulmonary disease, unspecified: Secondary | ICD-10-CM | POA: Diagnosis not present

## 2021-01-07 NOTE — Progress Notes (Signed)
Take lasix 40mg  bid x 3 days, then back to 40mg  daily,  and then call and update [place]  Roger Abts MD

## 2021-01-07 NOTE — Progress Notes (Signed)
Pt voiced understanding and will update [place] on Friday or Monday with symptoms

## 2021-01-11 DIAGNOSIS — I5022 Chronic systolic (congestive) heart failure: Secondary | ICD-10-CM | POA: Diagnosis not present

## 2021-01-11 DIAGNOSIS — I5032 Chronic diastolic (congestive) heart failure: Secondary | ICD-10-CM | POA: Diagnosis not present

## 2021-01-11 DIAGNOSIS — J9601 Acute respiratory failure with hypoxia: Secondary | ICD-10-CM | POA: Diagnosis not present

## 2021-01-14 DIAGNOSIS — J449 Chronic obstructive pulmonary disease, unspecified: Secondary | ICD-10-CM | POA: Diagnosis not present

## 2021-01-14 NOTE — Telephone Encounter (Signed)
lmtcb for pt.  

## 2021-01-15 ENCOUNTER — Telehealth: Payer: Self-pay | Admitting: Primary Care

## 2021-01-15 MED ORDER — PREDNISONE 10 MG PO TABS: 40.0000 mg | ORAL_TABLET | Freq: Every day | ORAL | 0 refills | Status: DC

## 2021-01-15 NOTE — Telephone Encounter (Signed)
Dr. 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   Reached out to patient via telephone.  Patient stated that that over the weekend he developed a productive cough with green sputum Sob is baseline.  Denies wheezing, chills, sweats or additional sx. He is using albuterol HFA once daily, spiriva BID and vest once daily with some relief in sx.  He felt that he had a slight fever on Saturday but he did not measure his temp.  He is not currently using OTC medications to help sx.  Taking azithro every M,W,F.  Fully vaccinated against covid and flu.   Dr. Valeta Harms, please advise. Thanks

## 2021-01-15 NOTE — Telephone Encounter (Signed)
Called and spoke with pt letting him know the recs stated by Dr.Icard and he verbalized understanding. Verified preferred pharmacy and sent Rx for prednisone to pharmacy for pt. Nothing further needed.

## 2021-01-15 NOTE — Telephone Encounter (Signed)
Lm x1 for pt.

## 2021-01-15 NOTE — Telephone Encounter (Signed)
Pt returning a phone call. Pt can be reached at [phone]

## 2021-01-15 NOTE — Telephone Encounter (Signed)
Ok to start prednisone 40mg  x 5 days.  Monitor symptoms and fevers I would like to avoid abx use with the history or mycobact. Abscessus  He probably needs to be covid tested for completeness eventhough he is vaccinated   [facility] 01/15/2021 10:47 AM

## 2021-01-29 ENCOUNTER — Ambulatory Visit (INDEPENDENT_AMBULATORY_CARE_PROVIDER_SITE_OTHER): Payer: Medicare HMO

## 2021-01-29 DIAGNOSIS — Z95810 Presence of automatic (implantable) cardiac defibrillator: Secondary | ICD-10-CM | POA: Diagnosis not present

## 2021-01-29 DIAGNOSIS — R220 Localized swelling, mass and lump, head: Secondary | ICD-10-CM | POA: Diagnosis not present

## 2021-01-29 DIAGNOSIS — I5022 Chronic systolic (congestive) heart failure: Secondary | ICD-10-CM

## 2021-01-29 DIAGNOSIS — J069 Acute upper respiratory infection, unspecified: Secondary | ICD-10-CM | POA: Insufficient documentation

## 2021-01-29 DIAGNOSIS — R600 Localized edema: Secondary | ICD-10-CM | POA: Diagnosis not present

## 2021-01-29 NOTE — Progress Notes (Signed)
EPIC Encounter for ICM Monitoring  Patient Name: Roger Lowe. is a 75 y.o. male Date: 01/29/2021 Primary Care Physican: [name], MD Primary Cardiologist:Branch Electrophysiologist:Taylor 5/3/2022Weight:240lbs 01/29/2021 Weight: 240 lbs   Spoke with patient.  He reports leg swelling over the last few days but is improving.  CorVue thoracic impedancesuggestingpossible fluid accumulation starting 01/26/2021.  Prescribed:  Furosemide40 mg take 1 tablet daily. Spironolactone 25 mg take 1 tablet daily  Labs: 09/07/2020 Creatinine 1.15, BUN 8, Potassium 3.9, Sodium 139, GFR 63-73 08/03/2020 Creatinine 1.05, BUN 11, Potassium 4.2, Sodium 138 06/01/2020 Creatinine1.28, BUN15, Potassium3.9, Sodium136, GFR56->60 03/20/2020 Creatinine1.17, BUN6, Potassium 3.9, Sodium139, GFR>60  02/16/2020 Creatinine1.23, BUN12, Potassium4.2, [id number], GFR58->60 A complete set of results can be found in Results Review.  Recommendations: Advised to take extra 20 mg Furosemide x 1 day if needed for leg swelling and then return to 1 tablet daily.  Follow-up plan: ICM clinic phone appointment on6/8/022 to recheck fluid levels. 91 day device clinic remote transmission6/04/2021.   EP/Cardiology Office Visits: Recall 6/17/2022with Dr. Lovena Le.   Copy of ICM check sent to Dr.Taylor.   3 month ICM trend: 01/29/2021.    1 Year ICM trend:       [name], RN 01/29/2021 12:12 PM

## 2021-02-05 DIAGNOSIS — M25551 Pain in right hip: Secondary | ICD-10-CM | POA: Insufficient documentation

## 2021-02-05 DIAGNOSIS — J019 Acute sinusitis, unspecified: Secondary | ICD-10-CM | POA: Insufficient documentation

## 2021-02-05 DIAGNOSIS — E1165 Type 2 diabetes mellitus with hyperglycemia: Secondary | ICD-10-CM | POA: Diagnosis not present

## 2021-02-05 DIAGNOSIS — R220 Localized swelling, mass and lump, head: Secondary | ICD-10-CM | POA: Diagnosis not present

## 2021-02-06 ENCOUNTER — Ambulatory Visit (INDEPENDENT_AMBULATORY_CARE_PROVIDER_SITE_OTHER): Payer: Medicare HMO

## 2021-02-06 DIAGNOSIS — Z95810 Presence of automatic (implantable) cardiac defibrillator: Secondary | ICD-10-CM

## 2021-02-06 DIAGNOSIS — I428 Other cardiomyopathies: Secondary | ICD-10-CM | POA: Diagnosis not present

## 2021-02-06 DIAGNOSIS — I5022 Chronic systolic (congestive) heart failure: Secondary | ICD-10-CM

## 2021-02-06 DIAGNOSIS — J449 Chronic obstructive pulmonary disease, unspecified: Secondary | ICD-10-CM | POA: Diagnosis not present

## 2021-02-06 LAB — CUP PACEART REMOTE DEVICE CHECK
Battery Remaining Longevity: 91 mo
Battery Remaining Percentage: 85 %
Battery Voltage: 2.96 V
Date Time Interrogation Session: 20220608020035
HighPow Impedance: 80 Ohm
Implantable Lead Implant Date: 20210308
Implantable Lead Implant Date: 20210308
Implantable Lead Implant Date: 20210308
Implantable Lead Location: 753858
Implantable Lead Location: 753859
Implantable Lead Location: 753860
Implantable Lead Model: 7122
Implantable Pulse Generator Implant Date: 20210308
Lead Channel Impedance Value: 410 Ohm
Lead Channel Impedance Value: 540 Ohm
Lead Channel Impedance Value: 930 Ohm
Lead Channel Pacing Threshold Amplitude: 0.625 V
Lead Channel Pacing Threshold Amplitude: 0.75 V
Lead Channel Pacing Threshold Amplitude: 1.25 V
Lead Channel Pacing Threshold Pulse Width: 0.5 ms
Lead Channel Pacing Threshold Pulse Width: 0.5 ms
Lead Channel Pacing Threshold Pulse Width: 0.5 ms
Lead Channel Sensing Intrinsic Amplitude: 11.7 mV
Lead Channel Sensing Intrinsic Amplitude: 4.2 mV
Lead Channel Setting Pacing Amplitude: 1.625
Lead Channel Setting Pacing Amplitude: 2 V
Lead Channel Setting Pacing Amplitude: 2.5 V
Lead Channel Setting Pacing Pulse Width: 0.5 ms
Lead Channel Setting Pacing Pulse Width: 0.5 ms
Lead Channel Setting Sensing Sensitivity: 0.5 mV
Pulse Gen Serial Number: 111019088

## 2021-02-08 NOTE — Progress Notes (Signed)
EPIC Encounter for ICM Monitoring  Patient Name: Holden Maniscalco. is a 75 y.o. male Date: 02/08/2021 Primary Care Physican: [name], MD Primary Cardiologist: Branch Electrophysiologist: Lovena Le 01/29/2021 Weight: 240 lbs                                                                  Spoke with patient.  He reports leg swelling resolved.   CorVue thoracic impedance suggesting fluid levels returned to normal after taking extra Furosemide   Prescribed: Furosemide 40 mg take 1 tablet daily. Spironolactone 25 mg take 1 tablet daily   Labs: 09/07/2020 Creatinine 1.15, BUN 8,   Potassium 3.9, Sodium 139, GFR 63-73 A complete set of results can be found in Results Review.   Recommendations:  No changes and encouraged to call if experiencing any fluid symptoms.   Follow-up plan: ICM clinic phone appointment on 03/01/2021.   91 day device clinic remote transmission 05/08/2021.     EP/Cardiology Office Visits:  Recall 02/15/2021 with Dr. Lovena Le.     Copy of ICM check sent to Dr. Lovena Le.    3 month ICM trend: 02/06/2021.    1 Year ICM trend:       [name], RN 02/08/2021 11:54 AM

## 2021-02-11 DIAGNOSIS — I5032 Chronic diastolic (congestive) heart failure: Secondary | ICD-10-CM | POA: Diagnosis not present

## 2021-02-11 DIAGNOSIS — I5022 Chronic systolic (congestive) heart failure: Secondary | ICD-10-CM | POA: Diagnosis not present

## 2021-02-11 DIAGNOSIS — J9601 Acute respiratory failure with hypoxia: Secondary | ICD-10-CM | POA: Diagnosis not present

## 2021-02-15 DIAGNOSIS — M545 Low back pain, unspecified: Secondary | ICD-10-CM | POA: Insufficient documentation

## 2021-02-15 DIAGNOSIS — J449 Chronic obstructive pulmonary disease, unspecified: Secondary | ICD-10-CM | POA: Diagnosis not present

## 2021-02-15 DIAGNOSIS — G8929 Other chronic pain: Secondary | ICD-10-CM | POA: Insufficient documentation

## 2021-02-19 DIAGNOSIS — M109 Gout, unspecified: Secondary | ICD-10-CM | POA: Insufficient documentation

## 2021-02-19 DIAGNOSIS — I509 Heart failure, unspecified: Secondary | ICD-10-CM

## 2021-02-20 DIAGNOSIS — I10 Essential (primary) hypertension: Secondary | ICD-10-CM | POA: Diagnosis not present

## 2021-02-20 DIAGNOSIS — E1165 Type 2 diabetes mellitus with hyperglycemia: Secondary | ICD-10-CM | POA: Diagnosis not present

## 2021-02-22 ENCOUNTER — Other Ambulatory Visit: Payer: Self-pay

## 2021-02-22 NOTE — Telephone Encounter (Signed)
Refill request sent in from pharmacy. Patient is requesting refill on Azithromycin 250 mg: Take 1 tablet every Monday, Wednesday and Friday.  Past OV 01/01/21  Dr. Valeta Harms is this ok to refill?   Please advise.  Thank you

## 2021-02-25 ENCOUNTER — Other Ambulatory Visit: Payer: Self-pay

## 2021-02-25 ENCOUNTER — Encounter (HOSPITAL_COMMUNITY)
Admission: RE | Admit: 2021-02-25 | Discharge: 2021-02-25 | Disposition: A | Discharge status: Home / Self Care | Payer: Medicare HMO | Source: Ambulatory Visit | Attending: Pulmonary Disease | Admitting: Pulmonary Disease

## 2021-02-25 ENCOUNTER — Encounter (HOSPITAL_COMMUNITY): Payer: Self-pay

## 2021-02-25 VITALS — BP 148/90 | HR 89 | Ht 72.0 in | Wt 226.4 lb

## 2021-02-25 DIAGNOSIS — J449 Chronic obstructive pulmonary disease, unspecified: Secondary | ICD-10-CM | POA: Insufficient documentation

## 2021-02-25 LAB — GLUCOSE, CAPILLARY
Glucose-Capillary: 99 mg/dL (ref 70–99)
Glucose-Capillary: 107 mg/dL — ABNORMAL HIGH (ref 70–99)
Glucose-Capillary: 108 mg/dL — ABNORMAL HIGH (ref 70–99)
Glucose-Capillary: 132 mg/dL — ABNORMAL HIGH (ref 70–99)

## 2021-02-25 MED ORDER — AZITHROMYCIN 250 MG PO TABS: ORAL_TABLET | ORAL | 5 refills | Status: DC

## 2021-02-25 NOTE — Telephone Encounter (Signed)
Medication safety alert is coming up. Just wanted to run this by you. Please advise   Thank you

## 2021-02-25 NOTE — Progress Notes (Signed)
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 shortness of breath with ADL's Yes    Intervention Provide education, individualized exercise plan and daily activity instruction to help decrease symptoms of SOB with activities of daily living.    Expected Outcomes Short Term: Improve cardiorespiratory fitness to achieve a reduction of symptoms when performing ADLs;Long Term: Be able to perform more ADLs without symptoms or delay the onset of symptoms    Increase knowledge of respiratory medications and ability to use respiratory devices properly  Yes    Intervention Provide education and demonstration as needed of appropriate use of medications, inhalers, and oxygen therapy.    Expected Outcomes Short Term: Achieves understanding of medications use.  Understands that oxygen is a medication prescribed by physician. Demonstrates appropriate use of inhaler and oxygen therapy.;Long  Term: Maintain appropriate use of medications, inhalers, and oxygen therapy.    Diabetes Yes    Intervention Provide education about signs/symptoms and action to take for hypo/hyperglycemia.;Provide education about proper nutrition, including hydration, and aerobic/resistive exercise prescription along with prescribed medications to achieve blood glucose in normal ranges: Fasting glucose 65-99 mg/dL    Expected Outcomes Short Term: Participant verbalizes understanding of the signs/symptoms and immediate care of hyper/hypoglycemia, proper foot care and importance of medication, aerobic/resistive exercise and nutrition plan for blood glucose control.;Long Term: Attainment of HbA1C < 7%.    Heart Failure Yes    Intervention Provide a combined exercise and nutrition program that is supplemented with education, support and counseling about heart failure. Directed toward relieving symptoms such as shortness of breath, decreased exercise tolerance, and extremity edema.    Expected Outcomes Improve functional capacity of life;Short term: Attendance in program 2-3 days a week with increased exercise capacity. Reported lower sodium intake. Reported increased fruit and vegetable intake. Reports medication compliance.;Short term: Daily weights obtained and reported for increase. Utilizing diuretic protocols set by physician.;Long term: Adoption of self-care skills and reduction of barriers for early signs and symptoms recognition and intervention leading to self-care maintenance.    Hypertension Yes    Intervention Provide education on lifestyle modifcations including regular physical activity/exercise, weight management, moderate sodium restriction and increased consumption of fresh fruit, vegetables, and low fat dairy, alcohol moderation, and smoking cessation.;Monitor prescription use  compliance.    Expected Outcomes Short Term: Continued assessment and intervention until BP is < 140/1mm HG in hypertensive participants. < 130/39mm HG in hypertensive participants with diabetes, heart failure or chronic kidney disease.;Long Term: Maintenance of blood pressure at goal levels.    Lipids Yes    Intervention Provide education and support for participant on nutrition & aerobic/resistive exercise along with prescribed medications to achieve LDL 70mg , HDL >40mg .    Expected Outcomes Short Term: Participant states understanding of desired cholesterol values and is compliant with medications prescribed. Participant is following exercise prescription and nutrition guidelines.;Long Term: Cholesterol controlled with medications as prescribed, with individualized exercise RX and with personalized nutrition plan. Value goals: LDL < 70mg , HDL > 40 mg.    Stress Yes    Intervention Offer individual and/or small group education and counseling on adjustment to heart disease, stress management and health-related lifestyle change. Teach and support self-help strategies.;Refer participants experiencing significant psychosocial distress to appropriate mental health specialists for further evaluation and treatment. When possible, include family members and significant others in education/counseling sessions.    Expected Outcomes Short Term: Participant demonstrates changes in health-related behavior, relaxation and other stress management skills, ability to obtain effective social support, and compliance with psychotropic medications if prescribed.;Long Term: Emotional wellbeing is indicated by absence of clinically significant psychosocial distress or social isolation.             Core Components/Risk Factors/Patient Goals Review:    Core Components/Risk Factors/Patient Goals at Discharge (Final Review):    ITP Comments:   Comments: Patient arrived for 1st visit/orientation/education at 1230.  Patient was referred to PR by Dr. Valeta Harms due to stage 3 severe COPD. During orientation advised patient on arrival and appointment times what to wear, what to do before, during and after exercise. Reviewed attendance and class policy.  Pt is scheduled to return Pulmonary Rehab on 03/12/2021 at 1045. Pt was advised to come to class 15 minutes before class starts.  Discussed RPE/Dpysnea scales. Patient participated in warm up stretches. Patient was  able to complete 6 minute walk test. Patient was measured for the equipment. Discussed equipment safety with patient. Took patient pre-anthropometric measurements. Patient finished visit at 1505.

## 2021-02-25 NOTE — Progress Notes (Signed)
Cardiac/Pulmonary Rehab Medication Review by a Pharmacist  Does the patient  feel that his/her medications are working for him/her?  yes  Has the patient been experiencing any side effects to the medications prescribed?  yes  Does the patient measure his/her own blood pressure or blood glucose at home?  yes   Does the patient have any problems obtaining medications due to transportation or finances?   no  Understanding of regimen: excellent Understanding of indications: excellent Potential of compliance: excellent  Questions asked to Determine Patient Understanding of Medication Regimen:  1. What is the name of the medication?  2. What is the medication used for?  3. When should it be taken?  4. How much should be taken?  5. How will you take it?  6. What side effects should you report?  Understanding Defined as: Excellent: All questions above are correct Good: Questions 1-4 are correct Fair: Questions 1-2 are correct  Poor: 1 or none of the above questions are correct   Pharmacist comments: Patient presents today for pulmonary rehab. We reviewed his medications and currently tolerating. He is compliant with regimen. Not currently taking entresto because they think it is causing him to cough. The cough has gotten better since he stopped. He weighs himself daily. He monitors is BP, O2 sats, and BS at least daily and more often as needed. He would like to have the energy and stamina to do things for himself and build things with his hands. What a delight to talk with. He will f/u with cardiology regarding Entresto ;otherwise, continue current regimen.  Thanks for the opportunity to participate in the care of this patient,  Isac Sarna, BS [place], [place] Clinical Pharmacist Pager [phone] 02/25/2021 1:50 PM

## 2021-02-26 DIAGNOSIS — K5900 Constipation, unspecified: Secondary | ICD-10-CM | POA: Insufficient documentation

## 2021-02-26 DIAGNOSIS — N1831 Chronic kidney disease, stage 3a: Secondary | ICD-10-CM | POA: Insufficient documentation

## 2021-02-26 DIAGNOSIS — D696 Thrombocytopenia, unspecified: Secondary | ICD-10-CM | POA: Insufficient documentation

## 2021-02-26 DIAGNOSIS — N182 Chronic kidney disease, stage 2 (mild): Secondary | ICD-10-CM | POA: Insufficient documentation

## 2021-02-26 DIAGNOSIS — J189 Pneumonia, unspecified organism: Secondary | ICD-10-CM | POA: Insufficient documentation

## 2021-02-26 DIAGNOSIS — I10 Essential (primary) hypertension: Secondary | ICD-10-CM | POA: Diagnosis present

## 2021-02-26 DIAGNOSIS — G894 Chronic pain syndrome: Secondary | ICD-10-CM | POA: Insufficient documentation

## 2021-02-26 DIAGNOSIS — G43009 Migraine without aura, not intractable, without status migrainosus: Secondary | ICD-10-CM | POA: Insufficient documentation

## 2021-02-27 ENCOUNTER — Other Ambulatory Visit: Payer: Self-pay | Admitting: Cardiology

## 2021-02-27 DIAGNOSIS — Z9981 Dependence on supplemental oxygen: Secondary | ICD-10-CM | POA: Diagnosis not present

## 2021-02-27 DIAGNOSIS — K219 Gastro-esophageal reflux disease without esophagitis: Secondary | ICD-10-CM | POA: Diagnosis not present

## 2021-02-27 DIAGNOSIS — M109 Gout, unspecified: Secondary | ICD-10-CM | POA: Diagnosis not present

## 2021-02-27 DIAGNOSIS — J189 Pneumonia, unspecified organism: Secondary | ICD-10-CM | POA: Diagnosis not present

## 2021-02-27 DIAGNOSIS — I2510 Atherosclerotic heart disease of native coronary artery without angina pectoris: Secondary | ICD-10-CM | POA: Diagnosis not present

## 2021-02-27 DIAGNOSIS — I10 Essential (primary) hypertension: Secondary | ICD-10-CM | POA: Diagnosis not present

## 2021-02-27 DIAGNOSIS — N1831 Chronic kidney disease, stage 3a: Secondary | ICD-10-CM | POA: Diagnosis not present

## 2021-02-27 DIAGNOSIS — I509 Heart failure, unspecified: Secondary | ICD-10-CM | POA: Diagnosis not present

## 2021-02-27 DIAGNOSIS — E1165 Type 2 diabetes mellitus with hyperglycemia: Secondary | ICD-10-CM | POA: Diagnosis not present

## 2021-02-27 DIAGNOSIS — I495 Sick sinus syndrome: Secondary | ICD-10-CM | POA: Diagnosis not present

## 2021-02-27 DIAGNOSIS — R269 Unspecified abnormalities of gait and mobility: Secondary | ICD-10-CM | POA: Diagnosis not present

## 2021-02-27 DIAGNOSIS — Z950 Presence of cardiac pacemaker: Secondary | ICD-10-CM | POA: Diagnosis not present

## 2021-02-28 DIAGNOSIS — K219 Gastro-esophageal reflux disease without esophagitis: Secondary | ICD-10-CM | POA: Diagnosis not present

## 2021-02-28 DIAGNOSIS — E1165 Type 2 diabetes mellitus with hyperglycemia: Secondary | ICD-10-CM | POA: Diagnosis not present

## 2021-02-28 NOTE — Progress Notes (Signed)
Remote ICD transmission.   

## 2021-03-01 ENCOUNTER — Telehealth: Payer: Self-pay

## 2021-03-01 ENCOUNTER — Ambulatory Visit (INDEPENDENT_AMBULATORY_CARE_PROVIDER_SITE_OTHER): Payer: Medicare HMO

## 2021-03-01 DIAGNOSIS — I5022 Chronic systolic (congestive) heart failure: Secondary | ICD-10-CM | POA: Diagnosis not present

## 2021-03-01 DIAGNOSIS — Z95810 Presence of automatic (implantable) cardiac defibrillator: Secondary | ICD-10-CM

## 2021-03-01 NOTE — Telephone Encounter (Signed)
Remote ICM transmission received.  Attempted call to patient regarding ICM remote transmission and left message per DPR to return call.   

## 2021-03-01 NOTE — Progress Notes (Signed)
EPIC Encounter for ICM Monitoring  Patient Name: Roger Matt. is a 75 y.o. male Date: 03/01/2021 Primary Care Physican: [name], MD Primary Cardiologist: Branch Electrophysiologist: Lovena Le 01/29/2021 Weight: 240 lbs                                                                  Attempted call to patient and unable to reach.  Left message to return call. Transmission reviewed.    CorVue thoracic impedance suggesting  possible fluid accumulation starting 02/23/2021 and returned to baseline normal on 02/28/2021.   Prescribed: Furosemide 40 mg take 1 tablet daily. Spironolactone 25 mg take 1 tablet daily   Labs: 09/07/2020 Creatinine 1.15, BUN 8,   Potassium 3.9, Sodium 139, GFR 63-73 A complete set of results can be found in Results Review.   Recommendations:  Unable to reach.     Follow-up plan: ICM clinic phone appointment on 04/01/2021.   91 day device clinic remote transmission 05/08/2021.     EP/Cardiology Office Visits:  03/28/2021 with Levell July, NP.  Recall 02/15/2021 with Dr. Lovena Le.     Copy of ICM check sent to Dr. Lovena Le.   Direct Trend view through 02/28/2021   3 month ICM trend: 02/27/2021.    1 Year ICM trend:       Roger Billings, RN 03/01/2021 4:04 PM

## 2021-03-03 ENCOUNTER — Other Ambulatory Visit: Payer: Self-pay | Admitting: Cardiology

## 2021-03-04 ENCOUNTER — Other Ambulatory Visit: Payer: Self-pay | Admitting: Cardiology

## 2021-03-05 NOTE — Telephone Encounter (Signed)
Prescription refill request for Xarelto received.  hx: dvt Last office visit: 08/13/2020 Weight: 102.7 kg  Age: 75 yo  Scr: 1.15, 09/08/2020 CrCl: 83 ml/min   Pt is on Xarelto 20 mg daily.  For  DVT prevention pt should be on Xarelto 10 mg daily.

## 2021-03-06 NOTE — Telephone Encounter (Signed)
Dr Harl Bowie, Please advise re. Dosage Thanks

## 2021-03-08 DIAGNOSIS — J449 Chronic obstructive pulmonary disease, unspecified: Secondary | ICD-10-CM | POA: Diagnosis not present

## 2021-03-12 ENCOUNTER — Encounter (HOSPITAL_COMMUNITY): Payer: Medicare HMO

## 2021-03-13 DIAGNOSIS — I5032 Chronic diastolic (congestive) heart failure: Secondary | ICD-10-CM | POA: Diagnosis not present

## 2021-03-13 DIAGNOSIS — J9601 Acute respiratory failure with hypoxia: Secondary | ICD-10-CM | POA: Diagnosis not present

## 2021-03-13 DIAGNOSIS — I5022 Chronic systolic (congestive) heart failure: Secondary | ICD-10-CM | POA: Diagnosis not present

## 2021-03-14 ENCOUNTER — Other Ambulatory Visit: Payer: Self-pay

## 2021-03-14 ENCOUNTER — Encounter (HOSPITAL_COMMUNITY)
Admission: RE | Admit: 2021-03-14 | Discharge: 2021-03-14 | Disposition: A | Discharge status: Home / Self Care | Payer: Medicare HMO | Source: Ambulatory Visit | Attending: Pulmonary Disease | Admitting: Pulmonary Disease

## 2021-03-14 DIAGNOSIS — J449 Chronic obstructive pulmonary disease, unspecified: Secondary | ICD-10-CM | POA: Diagnosis not present

## 2021-03-14 LAB — GLUCOSE, CAPILLARY: Glucose-Capillary: 125 mg/dL — ABNORMAL HIGH (ref 70–99)

## 2021-03-14 NOTE — Progress Notes (Signed)
Daily Session Note  Patient Details  Name: Roger Lowe. MRN: [id number] Date of Birth: [birth year] Referring Provider:   Flowsheet Row PULMONARY REHAB COPD ORIENTATION from 02/25/2021 in [place]  Referring Provider Dr. Valeta Harms       Encounter Date: 03/14/2021  Check In:  Session Check In - 03/14/21 1045       Check-In   Supervising physician immediately available to respond to emergencies [facility] MD immediately available    Physician(s) Dr. Domenic Polite    Location [facility]    Staff Present Aundra Dubin, RN, Bjorn Loser, MS, ACSM-CEP, Exercise Physiologist    Virtual Visit No    Medication changes reported     No    Fall or balance concerns reported    Yes    Comments He has fallen twice in the past two years. He reports that he feels dizzy often and falls due to this.    Tobacco Cessation No Change    Warm-up and Cool-down Performed as group-led instruction    Resistance Training Performed No    VAD Patient? No    PAD/SET Patient? No      Pain Assessment   Currently in Pain? Yes    Pain Score 7     Pain Location Back    Pain Orientation Lower    Pain Descriptors / Indicators Constant    Pain Type Chronic pain    Pain Radiating Towards Radiating to hips bilaterally    Pain Onset More than a month ago    Pain Frequency Constant    Pain Relieving Factors Hydrocodone    Effect of Pain on Daily Activities Limits mobility    Multiple Pain Sites No             Capillary Blood Glucose: Results for orders placed or performed during the hospital encounter of 03/14/21 (from the past 24 hour(s))  Glucose, capillary     Status: Abnormal   Collection Time: 03/14/21 10:51 AM  Result Value Ref Range   Glucose-Capillary 125 (H) 70 - 99 mg/dL      Social History   Tobacco Use  Smoking Status Former   Packs/day: 1.00   Years: 20.00   Pack years: 20.00   Types: Cigarettes   Start date: 1968   Quit date: 09/04/2009   Years  since quitting: 11.5  Smokeless Tobacco Never    Goals Met:  Proper associated with RPD/PD & O2 Sat Independence with exercise equipment Using PLB without cueing & demonstrates good technique Exercise tolerated well No report of cardiac concerns or symptoms Strength training completed today  Goals Unmet:  Not Applicable  Comments: Check out 1145.   Dr. Kathie Dike is Medical Director for [facility].

## 2021-03-19 ENCOUNTER — Other Ambulatory Visit: Payer: Self-pay

## 2021-03-19 ENCOUNTER — Encounter (HOSPITAL_COMMUNITY)
Admission: RE | Admit: 2021-03-19 | Discharge: 2021-03-19 | Disposition: A | Discharge status: Home / Self Care | Payer: Medicare HMO | Source: Ambulatory Visit | Attending: Pulmonary Disease | Admitting: Pulmonary Disease

## 2021-03-19 VITALS — Wt 229.5 lb

## 2021-03-19 DIAGNOSIS — J449 Chronic obstructive pulmonary disease, unspecified: Secondary | ICD-10-CM

## 2021-03-19 LAB — GLUCOSE, CAPILLARY: Glucose-Capillary: 119 mg/dL — ABNORMAL HIGH (ref 70–99)

## 2021-03-19 NOTE — Progress Notes (Signed)
Daily Session Note  Patient Details  Name: Roger Lowe. MRN: [id number] Date of Birth: [birth year] Referring Provider:   Flowsheet Row PULMONARY REHAB COPD ORIENTATION from 02/25/2021 in [place]  Referring Provider Dr. Valeta Harms       Encounter Date: 03/19/2021  Check In:  Session Check In - 03/19/21 1045       Check-In   Supervising physician immediately available to respond to emergencies [facility] MD immediately available    Physician(s) Dr. Domenic Polite    Location [facility]    Staff Present Geanie Cooley, RN;Dalton Kris Mouton, MS, ACSM-CEP, Exercise Physiologist    Virtual Visit No    Medication changes reported     No    Fall or balance concerns reported    Yes    Tobacco Cessation No Change    Warm-up and Cool-down Performed as group-led instruction    Resistance Training Performed Yes    VAD Patient? No    PAD/SET Patient? No      Pain Assessment   Pain Score 5     Pain Location Back    Pain Orientation Lower    Pain Descriptors / Indicators Constant    Pain Type Chronic pain    Pain Onset More than a month ago    Pain Frequency Constant    Pain Relieving Factors hasn't taken any pain meds today    Multiple Pain Sites No             Capillary Blood Glucose: Results for orders placed or performed during the hospital encounter of 03/19/21 (from the past 24 hour(s))  Glucose, capillary     Status: Abnormal   Collection Time: 03/19/21 10:41 AM  Result Value Ref Range   Glucose-Capillary 119 (H) 70 - 99 mg/dL      Social History   Tobacco Use  Smoking Status Former   Packs/day: 1.00   Years: 20.00   Pack years: 20.00   Types: Cigarettes   Start date: 1968   Quit date: 09/04/2009   Years since quitting: 11.5  Smokeless Tobacco Never    Goals Met:  Proper associated with RPD/PD & O2 Sat Independence with exercise equipment Exercise tolerated well No report of cardiac concerns or symptoms Strength training  completed today  Goals Unmet:  Not Applicable  Comments: check out @ 11:45   Dr. Kathie Dike is Medical Director for [facility] Pul[facility].

## 2021-03-20 ENCOUNTER — Other Ambulatory Visit: Payer: Self-pay | Admitting: Cardiology

## 2021-03-20 NOTE — Progress Notes (Signed)
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 Review & Psychosocial Screening:  Initial Psych Review & Screening - 02/25/21 1320       Initial Review   Current issues with Current Stress Concerns;Current Sleep Concerns    Source of Stress Concerns Family    Comments He is stressed about the  care of his grandchildren      [place]? Yes    Comments His support system is his three daughters. One lives in [place] and two other ones live in [place].      Barriers   Psychosocial barriers to participate in program The patient should benefit from training in stress management and relaxation.      Screening Interventions   Interventions Encouraged to exercise;To provide support and resources with identified psychosocial needs;Provide feedback about the scores to  participant    Expected Outcomes Short Term goal: Utilizing psychosocial counselor, staff and physician to assist with identification of specific Stressors or current issues interfering with healing process. Setting desired goal for each stressor or current issue identified.;Long Term Goal: Stressors or current issues are controlled or eliminated.;Short Term goal: Identification and review with participant of any Quality of Life or Depression concerns found by scoring the questionnaire.;Long Term goal: The participant improves quality of Life and PHQ9 Scores as seen by post scores and/or verbalization of changes             Quality of Life Scores:  Quality of Life - 02/25/21 1546       Quality of Life   Select Quality of Life      Quality of Life Scores   Health/Function Pre 6.94 %    Socioeconomic Pre 8.71 %    Psych/Spiritual Pre 6.43 %    Family Pre 13 %    GLOBAL Pre 8.1 %            Scores of 19 and below usually indicate a poorer quality of life in these areas.  A difference of  2-3 points is a clinically meaningful difference.  A difference of 2-3 points in the total score of the Quality of Life Index has been associated with significant improvement in overall quality of life, self-image, physical symptoms, and general health in studies assessing change in quality of life.   PHQ-9: Recent Review Flowsheet Data     Depression screen [facility] 2/9 02/25/2021 10/04/2020 08/03/2020   Decreased Interest 2 0 0   Down, Depressed, Hopeless 1 0 0   PHQ - 2 Score 3 0 0   Altered sleeping 3 - -   Tired, decreased energy 3 - -   Change in appetite 3 - -   Feeling bad or failure about yourself  2 - -   Trouble concentrating 2 - -   Moving slowly or fidgety/restless 2 - -   Suicidal thoughts 1 - -   PHQ-9 Score 19 - -   Difficult doing work/chores Somewhat difficult - -      Interpretation of Total Score  Total Score Depression Severity:  1-4 = Minimal  depression, 5-9 = Mild  depression, 10-14 = Moderate depression, 15-19 = Moderately severe depression, 20-27 = Severe depression   Psychosocial Evaluation and Intervention:  Psychosocial Evaluation - 02/25/21 1410       Psychosocial Evaluation & Interventions   Interventions Encouraged to exercise with the program and follow exercise prescription;Stress management education;Relaxation education    Comments Pt has no barriers to participating in pulmonary rehab. He reports that he is currently stressed and his stressors include how his grandchildren are being raised. His PHQ-9 score was a 19. I explained to him that this is a high score and offered to communicate with his PCP if he wanted a referral to a councelor. He declined. He states that he is currenlty unable to do any of his hobbies or things he enjoys due to his SOB. He is optimistic that the pulmonary rehab program will allow him to do some of these things again and will help him out psychosocially. His goals are to decrease his SOB and be able to return to some of his hobbies which include crafts. He is eager to start the program and is hopeful that this will increase his quality of life.    Expected Outcomes The patient's PHQ-9 levels will decrease at discharge and his psychosocial issues will be lessened.    Continue Psychosocial Services  Follow up required by staff             Psychosocial Re-Evaluation:  Psychosocial Re-Evaluation     [place] Name 03/13/21 1350             Psychosocial Re-Evaluation   Current issues with Current Stress Concerns;Current Sleep Concerns       Comments Patient new to the program and has not started yet. He plans to start tomorrow 03/14/21. We will continue to monitor.       Expected Outcomes Patient will have no psychosocial barriers identified.       Interventions Relaxation education;Stress management education;Encouraged to attend Pulmonary Rehabilitation for the exercise       Continue Psychosocial Services  No  Follow up required                Psychosocial Discharge (Final Psychosocial Re-Evaluation):  Psychosocial Re-Evaluation - 03/13/21 1350       Psychosocial Re-Evaluation   Current issues with Current Stress Concerns;Current Sleep Concerns    Comments Patient new to the program and has not started yet. He plans to start tomorrow 03/14/21. We will continue to monitor.    Expected Outcomes Patient will have no psychosocial barriers identified.    Interventions Relaxation education;Stress management education;Encouraged to attend Pulmonary Rehabilitation for the exercise    Continue Psychosocial Services  No Follow up required              Education: Education Goals: Education classes will be provided on a weekly basis, covering required topics. Participant will state understanding/return demonstration of topics presented.  Learning Barriers/Preferences:  Learning Barriers/Preferences - 02/25/21 1322       Learning Barriers/Preferences   Learning Barriers Sight    Learning Preferences Written Material             Education Topics: How Lungs Work and Diseases: - Discuss the anatomy of the lungs and diseases that can affect the lungs, such as COPD.   Exercise: -Discuss the importance of exercise, FITT principles of exercise, normal and abnormal responses to exercise, and how to exercise safely.   Environmental Irritants: -Discuss types of environmental irritants  and how to limit exposure to environmental irritants. Flowsheet Row PULMONARY REHAB CHRONIC OBSTRUCTIVE PULMONARY DISEASE from 03/14/2021 in [place]  Date 03/14/21  Educator DJ  Instruction Review Code 1- Verbalizes Understanding       Meds/Inhalers and oxygen: - Discuss respiratory medications, definition of an inhaler and oxygen, and the proper way to use an inhaler and oxygen.   Energy Saving Techniques: - Discuss methods to conserve energy and decrease shortness of  breath when performing activities of daily living.    Bronchial Hygiene / Breathing Techniques: - Discuss breathing mechanics, pursed-lip breathing technique,  proper posture, effective ways to clear airways, and other functional breathing techniques   Cleaning Equipment: - Provides group verbal and written instruction about the health risks of elevated stress, cause of high stress, and healthy ways to reduce stress.   Nutrition I: Fats: - Discuss the types of cholesterol, what cholesterol does to the body, and how cholesterol levels can be controlled.   Nutrition II: Labels: -Discuss the different components of food labels and how to read food labels.   Respiratory Infections: - Discuss the signs and symptoms of respiratory infections, ways to prevent respiratory infections, and the importance of seeking medical treatment when having a respiratory infection.   Stress I: Signs and Symptoms: - Discuss the causes of stress, how stress may lead to anxiety and depression, and ways to limit stress.   Stress II: Relaxation: -Discuss relaxation techniques to limit stress.   Oxygen for Home/Travel: - Discuss how to prepare for travel when on oxygen and proper ways to transport and store oxygen to ensure safety.   Knowledge Questionnaire Score:  Knowledge Questionnaire Score - 02/25/21 1326       Knowledge Questionnaire Score   Pre Score 15/18             Core Components/Risk Factors/Patient Goals at Admission:  Personal Goals and Risk Factors at Admission - 02/25/21 1327       Core Components/Risk Factors/Patient Goals on Admission    Weight Management Yes;Obesity;Weight Maintenance    Intervention Obesity: Provide education and appropriate resources to help participant work on and attain dietary goals.;Weight Management/Obesity: Establish reasonable short term and long term weight goals.;Weight Management: Provide education and appropriate resources to help participant  work on and attain dietary goals.;Weight Management: Develop a combined nutrition and exercise program designed to reach desired caloric intake, while maintaining appropriate intake of nutrient and fiber, sodium and fats, and appropriate energy expenditure required for the weight goal.    Expected Outcomes Short Term: Continue to assess and modify interventions until short term weight is achieved;Long Term: Adherence to nutrition and physical activity/exercise program aimed toward attainment of established weight goal;Weight Maintenance: Understanding of the daily nutrition guidelines, which includes 25-35% calories from fat, 7% or less cal from saturated fats, less than 200mg  cholesterol, less than 1.5gm of sodium, & 5 or more servings of fruits and vegetables daily;Weight Loss: Understanding of general recommendations for a balanced deficit meal plan, which promotes 1-2 lb weight loss per week and includes a negative energy balance of [phone] kcal/d;Understanding recommendations for meals to include 15-35% energy as protein, 25-35% energy from fat, 35-60% energy from carbohydrates, less than 200mg  of dietary cholesterol, 20-35 gm of total fiber daily;Understanding of distribution of calorie intake throughout the day with the consumption of 4-5 meals/snacks    Improve shortness of breath with ADL's Yes    Intervention Provide education, individualized exercise plan and daily activity instruction to  help decrease symptoms of SOB with activities of daily living.    Expected Outcomes Short Term: Improve cardiorespiratory fitness to achieve a reduction of symptoms when performing ADLs;Long Term: Be able to perform more ADLs without symptoms or delay the onset of symptoms    Increase knowledge of respiratory medications and ability to use respiratory devices properly  Yes    Intervention Provide education and demonstration as needed of appropriate use of medications, inhalers, and oxygen therapy.    Expected  Outcomes Short Term: Achieves understanding of medications use. Understands that oxygen is a medication prescribed by physician. Demonstrates appropriate use of inhaler and oxygen therapy.;Long Term: Maintain appropriate use of medications, inhalers, and oxygen therapy.    Diabetes Yes    Intervention Provide education about signs/symptoms and action to take for hypo/hyperglycemia.;Provide education about proper nutrition, including hydration, and aerobic/resistive exercise prescription along with prescribed medications to achieve blood glucose in normal ranges: Fasting glucose 65-99 mg/dL    Expected Outcomes Short Term: Participant verbalizes understanding of the signs/symptoms and immediate care of hyper/hypoglycemia, proper foot care and importance of medication, aerobic/resistive exercise and nutrition plan for blood glucose control.;Long Term: Attainment of HbA1C < 7%.    Heart Failure Yes    Intervention Provide a combined exercise and nutrition program that is supplemented with education, support and counseling about heart failure. Directed toward relieving symptoms such as shortness of breath, decreased exercise tolerance, and extremity edema.    Expected Outcomes Improve functional capacity of life;Short term: Attendance in program 2-3 days a week with increased exercise capacity. Reported lower sodium intake. Reported increased fruit and vegetable intake. Reports medication compliance.;Short term: Daily weights obtained and reported for increase. Utilizing diuretic protocols set by physician.;Long term: Adoption of self-care skills and reduction of barriers for early signs and symptoms recognition and intervention leading to self-care maintenance.    Hypertension Yes    Intervention Provide education on lifestyle modifcations including regular physical activity/exercise, weight management, moderate sodium restriction and increased consumption of fresh fruit, vegetables, and low fat dairy, alcohol  moderation, and smoking cessation.;Monitor prescription use compliance.    Expected Outcomes Short Term: Continued assessment and intervention until BP is < 140/65mm HG in hypertensive participants. < 130/61mm HG in hypertensive participants with diabetes, heart failure or chronic kidney disease.;Long Term: Maintenance of blood pressure at goal levels.    Lipids Yes    Intervention Provide education and support for participant on nutrition & aerobic/resistive exercise along with prescribed medications to achieve LDL 70mg , HDL >40mg .    Expected Outcomes Short Term: Participant states understanding of desired cholesterol values and is compliant with medications prescribed. Participant is following exercise prescription and nutrition guidelines.;Long Term: Cholesterol controlled with medications as prescribed, with individualized exercise RX and with personalized nutrition plan. Value goals: LDL < 70mg , HDL > 40 mg.    Stress Yes    Intervention Offer individual and/or small group education and counseling on adjustment to heart disease, stress management and health-related lifestyle change. Teach and support self-help strategies.;Refer participants experiencing significant psychosocial distress to appropriate mental health specialists for further evaluation and treatment. When possible, include family members and significant others in education/counseling sessions.    Expected Outcomes Short Term: Participant demonstrates changes in health-related behavior, relaxation and other stress management skills, ability to obtain effective social support, and compliance with psychotropic medications if prescribed.;Long Term: Emotional wellbeing is indicated by absence of clinically significant psychosocial distress or social isolation.  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 Valeta Harms. He will start the program 03/14/21. His personal goals for the program are to decrease his SOB and be able to return to his hobbies/crafts and ADL's. We will continue to monitor his progress as he works towards meeting these goals.       Expected Outcomes Patient will complete the program meeting both personal and program goals.                Core Components/Risk Factors/Patient Goals at Discharge (Final Review):   Goals and Risk Factor Review - 03/13/21 1355       Core Components/Risk Factors/Patient Goals Review   Personal Goals Review Weight Management/Obesity;Improve shortness of breath with ADL's    Review Patient was referred to PR with COPD stage III by Dr. Valeta Harms. He will start the program 03/14/21. His personal goals for the program are to decrease his SOB and be able to return to his hobbies/crafts and ADL's. We will continue to monitor his progress as he works towards meeting these goals.    Expected Outcomes Patient will complete the program meeting both personal and program goals.             ITP Comments:   Comments: ITP REVIEW Pt is making expected progress toward pulmonary rehab goals after completing 3 sessions. Recommend continued exercise, life style modification, education, and utilization of breathing techniques to increase stamina and strength and decrease shortness of breath with exertion.

## 2021-03-21 ENCOUNTER — Other Ambulatory Visit: Payer: Self-pay

## 2021-03-21 ENCOUNTER — Encounter (HOSPITAL_COMMUNITY)
Admission: RE | Admit: 2021-03-21 | Discharge: 2021-03-21 | Disposition: A | Discharge status: Home / Self Care | Payer: Medicare HMO | Source: Ambulatory Visit | Attending: Pulmonary Disease | Admitting: Pulmonary Disease

## 2021-03-21 DIAGNOSIS — J449 Chronic obstructive pulmonary disease, unspecified: Secondary | ICD-10-CM

## 2021-03-21 LAB — GLUCOSE, CAPILLARY: Glucose-Capillary: 130 mg/dL — ABNORMAL HIGH (ref 70–99)

## 2021-03-21 NOTE — Progress Notes (Signed)
Daily Session Note  Patient Details  Name: Roger Lowe. MRN: [id number] Date of Birth: 1945/12/01 Referring Provider:   Flowsheet Row PULMONARY REHAB COPD ORIENTATION from 02/25/2021 in [place]  Referring Provider Dr. Valeta Harms       Encounter Date: 03/21/2021  Check In:  Session Check In - 03/21/21 1045       Check-In   Supervising physician immediately available to respond to emergencies [facility] MD immediately available    Physician(s) Dr. Johnsie Cancel    Location [facility]    Staff Present Hoy Register, MS, ACSM-CEP, Exercise Physiologist;Other    Virtual Visit No    Medication changes reported     No    Fall or balance concerns reported    No    Tobacco Cessation No Change    Warm-up and Cool-down Performed as group-led instruction    Resistance Training Performed Yes    VAD Patient? No    PAD/SET Patient? No      Pain Assessment   Currently in Pain? Yes    Pain Score 4     Pain Location Back    Pain Orientation Lower    Pain Descriptors / Indicators Constant    Pain Type Chronic pain    Pain Radiating Towards radiates to hips bilaterally    Pain Onset More than a month ago    Pain Frequency Constant    Pain Relieving Factors hydrocodone    Effect of Pain on Daily Activities limits mobility    Multiple Pain Sites No             Capillary Blood Glucose: No results found for this or any previous visit (from the past 24 hour(s)).    Social History   Tobacco Use  Smoking Status Former   Packs/day: 1.00   Years: 20.00   Pack years: 20.00   Types: Cigarettes   Start date: 1968   Quit date: 09/04/2009   Years since quitting: 11.5  Smokeless Tobacco Never    Goals Met:  Independence with exercise equipment Exercise tolerated well No report of cardiac concerns or symptoms Strength training completed today  Goals Unmet:  Not Applicable  Comments: checkout time is 1145   Dr. Kathie Dike is Medical Director  for [facility] Pulmonary Re[facility].

## 2021-03-26 ENCOUNTER — Encounter (HOSPITAL_COMMUNITY)
Admission: RE | Admit: 2021-03-26 | Discharge: 2021-03-26 | Disposition: A | Discharge status: Home / Self Care | Payer: Medicare HMO | Source: Ambulatory Visit | Attending: Pulmonary Disease | Admitting: Pulmonary Disease

## 2021-03-26 ENCOUNTER — Other Ambulatory Visit: Payer: Self-pay

## 2021-03-26 DIAGNOSIS — J449 Chronic obstructive pulmonary disease, unspecified: Secondary | ICD-10-CM

## 2021-03-26 NOTE — Progress Notes (Signed)
Daily Session Note  Patient Details  Name: Roger Lowe. MRN: [id number] Date of Birth: 09-04-1945 Referring Provider:   Flowsheet Row PULMONARY REHAB COPD ORIENTATION from 02/25/2021 in [place]  Referring Provider Dr. Valeta Harms       Encounter Date: 03/26/2021  Check In:  Session Check In - 03/26/21 1055       Check-In   Supervising physician immediately available to respond to emergencies [facility] MD immediately available    Physician(s) Dr. Harl Bowie    Location [facility]    Staff Present Geanie Cooley, RN;Dalton Fletcher, MS, ACSM-CEP, Exercise Physiologist    Virtual Visit No    Medication changes reported     No    Fall or balance concerns reported    Yes    Comments using cane for balance.    Tobacco Cessation No Change    Warm-up and Cool-down Performed as group-led instruction    Resistance Training Performed Yes    VAD Patient? No    PAD/SET Patient? No      Pain Assessment   Currently in Pain? No/denies    Pain Score 5     Pain Location Back    Pain Orientation Lower    Pain Descriptors / Indicators Constant;Aching    Pain Type Chronic pain    Pain Onset More than a month ago    Pain Frequency Constant    Pain Relieving Factors hydrocodone    Effect of Pain on Daily Activities limits mobility    Multiple Pain Sites No             Capillary Blood Glucose: No results found for this or any previous visit (from the past 24 hour(s)).    Social History   Tobacco Use  Smoking Status Former   Packs/day: 1.00   Years: 20.00   Pack years: 20.00   Types: Cigarettes   Start date: 1968   Quit date: 09/04/2009   Years since quitting: 11.5  Smokeless Tobacco Never    Goals Met:  Proper associated with RPD/PD & O2 Sat Independence with exercise equipment Exercise tolerated well No report of cardiac concerns or symptoms Strength training completed today  Goals Unmet:  Not Applicable  Comments: check out @  11:45am   Dr. Kathie Dike is Medical Director for [facility] Pulmonary Rehab.
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
## 2021-03-28 ENCOUNTER — Other Ambulatory Visit: Payer: Self-pay

## 2021-03-28 ENCOUNTER — Ambulatory Visit: Payer: Medicare HMO | Admitting: Family Medicine

## 2021-03-28 ENCOUNTER — Encounter (HOSPITAL_COMMUNITY)
Admission: RE | Admit: 2021-03-28 | Discharge: 2021-03-28 | Disposition: A | Discharge status: Home / Self Care | Payer: Medicare HMO | Source: Ambulatory Visit | Attending: Pulmonary Disease | Admitting: Pulmonary Disease

## 2021-03-28 DIAGNOSIS — I2510 Atherosclerotic heart disease of native coronary artery without angina pectoris: Secondary | ICD-10-CM

## 2021-03-28 DIAGNOSIS — I5022 Chronic systolic (congestive) heart failure: Secondary | ICD-10-CM

## 2021-03-28 DIAGNOSIS — J449 Chronic obstructive pulmonary disease, unspecified: Secondary | ICD-10-CM | POA: Diagnosis not present

## 2021-03-28 DIAGNOSIS — I10 Essential (primary) hypertension: Secondary | ICD-10-CM

## 2021-03-28 NOTE — Progress Notes (Signed)
Daily Session Note  Patient Details  Name: Roger Lowe. MRN: [id number] Date of Birth: 1946/05/14 Referring Provider:   Flowsheet Row PULMONARY REHAB COPD ORIENTATION from 02/25/2021 in [place]  Referring Provider Dr. Valeta Harms       Encounter Date: 03/28/2021  Check In:  Session Check In - 03/28/21 1040       Check-In   Supervising physician immediately available to respond to emergencies [facility] MD immediately available    Physician(s) Dr. Harl Bowie    Location [facility]    Staff Present Geanie Cooley, RN;Dalton Fletcher, MS, ACSM-CEP, Exercise Physiologist    Virtual Visit No    Medication changes reported     No    Fall or balance concerns reported    Yes    Comments using cane for balance.    Tobacco Cessation No Change    Warm-up and Cool-down Performed as group-led instruction    Resistance Training Performed Yes    VAD Patient? No    PAD/SET Patient? No      Pain Assessment   Currently in Pain? No/denies    Pain Score 5     Pain Location Back    Pain Orientation Lower    Pain Descriptors / Indicators Aching    Pain Type Chronic pain    Pain Onset More than a month ago    Pain Frequency Constant    Pain Relieving Factors hydrocodone    Multiple Pain Sites No             Capillary Blood Glucose: No results found for this or any previous visit (from the past 24 hour(s)).    Social History   Tobacco Use  Smoking Status Former   Packs/day: 1.00   Years: 20.00   Pack years: 20.00   Types: Cigarettes   Start date: 1968   Quit date: 09/04/2009   Years since quitting: 11.5  Smokeless Tobacco Never    Goals Met:  [place] with exercise equipment Using PLB without cueing & demonstrates good technique Exercise tolerated well No report of cardiac concerns or symptoms Strength training completed today  Goals Unmet:  Not Applicable  Comments: check out @ 11:45am   Dr. Kathie Dike is Medical  Director for [facility].

## 2021-03-31 DIAGNOSIS — E1165 Type 2 diabetes mellitus with hyperglycemia: Secondary | ICD-10-CM | POA: Diagnosis not present

## 2021-03-31 DIAGNOSIS — K219 Gastro-esophageal reflux disease without esophagitis: Secondary | ICD-10-CM | POA: Diagnosis not present

## 2021-04-01 ENCOUNTER — Ambulatory Visit (INDEPENDENT_AMBULATORY_CARE_PROVIDER_SITE_OTHER): Payer: Medicare HMO

## 2021-04-01 DIAGNOSIS — R269 Unspecified abnormalities of gait and mobility: Secondary | ICD-10-CM | POA: Diagnosis not present

## 2021-04-01 DIAGNOSIS — I5022 Chronic systolic (congestive) heart failure: Secondary | ICD-10-CM

## 2021-04-01 DIAGNOSIS — Z95810 Presence of automatic (implantable) cardiac defibrillator: Secondary | ICD-10-CM | POA: Diagnosis not present

## 2021-04-01 DIAGNOSIS — I509 Heart failure, unspecified: Secondary | ICD-10-CM | POA: Diagnosis not present

## 2021-04-02 ENCOUNTER — Encounter (HOSPITAL_COMMUNITY): Payer: Medicare HMO

## 2021-04-03 NOTE — Progress Notes (Signed)
EPIC Encounter for ICM Monitoring  Patient Name: Roger Lowe. is a 75 y.o. male Date: 04/03/2021 Primary Care Physican: Roger Squibb, MD Primary Cardiologist: Branch Electrophysiologist: Roger Lowe 04/03/2021 Weight: 237 lbs                                                                  Spoke with patient and heart failure questions reviewed.  Pt asymptomatic for fluid accumulation.  He has been having some hip pain.     CorVue thoracic impedance normal but was suggesting  possible fluid accumulation starting 7/1 - 7/15.   Prescribed: Furosemide 40 mg take 1 tablet daily. Spironolactone 25 mg take 1 tablet daily   Labs: 09/07/2020 Creatinine 1.15, BUN 8,   Potassium 3.9, Sodium 139, GFR 63-73 A complete set of results can be found in Results Review.   Recommendations:  Pt reporting PCP stopped Entresto approximately 4 weeks ago due to severe cough.  Pt wanted Dr Harl Bowie to be aware in the event he needs a medication to replace entresto.    Follow-up plan: ICM clinic phone appointment on 05/09/2021.   91 day device clinic remote transmission 05/08/2021.     EP/Cardiology Office Visits:  04/19/2021 with Levell July, NP.  Recall 02/15/2021 with Dr. Lovena Lowe.     Copy of ICM check sent to Dr. Lovena Lowe and Dr Harl Bowie for Juluis Rainier regarding Delene Loll.    3 month ICM trend: 04/01/2021.    1 Year ICM trend:       [name], RN 04/03/2021 4:50 PM

## 2021-04-04 ENCOUNTER — Other Ambulatory Visit: Payer: Self-pay

## 2021-04-04 ENCOUNTER — Encounter (HOSPITAL_COMMUNITY)
Admission: RE | Admit: 2021-04-04 | Discharge: 2021-04-04 | Disposition: A | Discharge status: Home / Self Care | Payer: Medicare HMO | Source: Ambulatory Visit | Attending: Pulmonary Disease | Admitting: Pulmonary Disease

## 2021-04-04 DIAGNOSIS — J449 Chronic obstructive pulmonary disease, unspecified: Secondary | ICD-10-CM | POA: Diagnosis not present

## 2021-04-04 NOTE — Progress Notes (Signed)
Daily Session Note  Patient Details  Name: Roger Froio Jr. MRN: [id number] Date of Birth: 10/16/1945 Referring Provider:   Flowsheet Row PULMONARY REHAB COPD ORIENTATION from 02/25/2021 in [facility] [place] [facility]  Referring Provider Dr. Icard       Encounter Date: 04/04/2021  Check In:  Session Check In - 04/04/21 1045       Check-In   Supervising physician immediately available to respond to emergencies [facility] MD immediately available    Physician(s) Dr. McDowell    Location [facility]    Staff Present Debra Johnson, RN, BSN;Dalton Fletcher, MS, ACSM-CEP, Exercise Physiologist    Virtual Visit No    Medication changes reported     No    Fall or balance concerns reported    Yes    Comments using cane for balance.    Tobacco Cessation No Change    Warm-up and Cool-down Performed as group-led instruction    Resistance Training Performed Yes    VAD Patient? No    PAD/SET Patient? No      Pain Assessment   Currently in Pain? Yes    Pain Score 8     Pain Location Back    Pain Orientation Lower    Pain Descriptors / Indicators Aching    Pain Type Chronic pain    Pain Radiating Towards Radiates to hips bilaterally    Pain Onset More than a month ago    Pain Frequency Constant    Pain Relieving Factors Hydrocodone    Effect of Pain on Daily Activities Limits mobility at times.    Multiple Pain Sites No             Capillary Blood Glucose: No results found for this or any previous visit (from the past 24 hour(s)).    Social History   Tobacco Use  Smoking Status Former   Packs/day: 1.00   Years: 20.00   Pack years: 20.00   Types: Cigarettes   Start date: 1968   Quit date: 09/04/2009   Years since quitting: 11.5  Smokeless Tobacco Never    Goals Met:  Proper associated with RPD/PD & O2 Sat [place] with exercise equipment Using PLB without cueing & demonstrates good technique Exercise tolerated well No report of cardiac concerns  or symptoms Strength training completed today  Goals Unmet:  Not Applicable  Comments: Check out 1145.   Dr. Jehanzeb Memon is Medical Director for [facility]. 

## 2021-04-05 NOTE — Progress Notes (Signed)
Received: Today Branch, Alphonse Guild, MD  Silas Muff Panda, RN [facility] for now, has appt with out PA in 2 weeks will assess at that time   J BrancH MD

## 2021-04-08 DIAGNOSIS — J449 Chronic obstructive pulmonary disease, unspecified: Secondary | ICD-10-CM | POA: Diagnosis not present

## 2021-04-09 ENCOUNTER — Other Ambulatory Visit: Payer: Self-pay

## 2021-04-09 ENCOUNTER — Encounter (HOSPITAL_COMMUNITY)
Admission: RE | Admit: 2021-04-09 | Discharge: 2021-04-09 | Disposition: A | Discharge status: Home / Self Care | Payer: Medicare HMO | Source: Ambulatory Visit | Attending: Pulmonary Disease | Admitting: Pulmonary Disease

## 2021-04-09 DIAGNOSIS — J449 Chronic obstructive pulmonary disease, unspecified: Secondary | ICD-10-CM | POA: Diagnosis not present

## 2021-04-09 NOTE — Progress Notes (Signed)
Daily Session Note  Patient Details  Name: Roger Lowe. MRN: [id number] Date of Birth: [birth year] Referring Provider:   Flowsheet Row PULMONARY REHAB COPD ORIENTATION from 02/25/2021 in [facility]  Referring Provider Dr. Valeta Harms       Encounter Date: 04/09/2021  Check In:  Session Check In - 04/09/21 1110       Check-In   Supervising physician immediately available to respond to emergencies [facility] MD immediately available    Physician(s) Dr. Johnsie Cancel    Location [facility]    Staff Present Geanie Cooley, RN;Dalton Fletcher, MS, ACSM-CEP, Exercise Physiologist    Virtual Visit No    Medication changes reported     No    Fall or balance concerns reported    No    Comments using cane for balance.    Tobacco Cessation No Change    Warm-up and Cool-down Performed as group-led instruction    Resistance Training Performed Yes    VAD Patient? No    PAD/SET Patient? No      Pain Assessment   Currently in Pain? No/denies    Pain Score 5     Pain Location Back    Pain Orientation Lower    Pain Descriptors / Indicators Aching    Pain Type Chronic pain    Pain Onset More than a month ago    Pain Frequency Constant    Pain Relieving Factors took pain meds this am    Multiple Pain Sites No             Capillary Blood Glucose: No results found for this or any previous visit (from the past 24 hour(s)).    Social History   Tobacco Use  Smoking Status Former   Packs/day: 1.00   Years: 20.00   Pack years: 20.00   Types: Cigarettes   Start date: 1968   Quit date: 09/04/2009   Years since quitting: 11.6  Smokeless Tobacco Never    Goals Met:  Proper associated with RPD/PD & O2 Sat [place] with exercise equipment Using PLB without cueing & demonstrates good technique Exercise tolerated well No report of cardiac concerns or symptoms Strength training completed today  Goals Unmet:  Not Applicable  Comments: check out @  11:45am   Dr. Kathie Dike is Medical Director for [facility].

## 2021-04-09 NOTE — Progress Notes (Signed)
I have reviewed a Home Exercise Prescription with Roger Lowe. Roger Lowe is not currently exercising at home.  The patient was advised to walk, use resistance bands, and use hand weights 1-3 days a week for 30-45 minutes.  Roger Lowe and I discussed how to progress their exercise prescription.  The patient stated that their goals were to get strong enough to do his hobbies again.  The patient stated that they understand the exercise prescription.  We reviewed exercise guidelines, target heart rate during exercise, RPE Scale, weather conditions, NTG use, endpoints for exercise, warmup and cool down.  Patient is encouraged to come to me with any questions. I will continue to follow up with the patient to assist them with progression and safety.    Hoy Register, M.S., ACSM CEP

## 2021-04-11 ENCOUNTER — Encounter (HOSPITAL_COMMUNITY): Payer: Medicare HMO

## 2021-04-13 DIAGNOSIS — I5032 Chronic diastolic (congestive) heart failure: Secondary | ICD-10-CM | POA: Diagnosis not present

## 2021-04-13 DIAGNOSIS — J9601 Acute respiratory failure with hypoxia: Secondary | ICD-10-CM | POA: Diagnosis not present

## 2021-04-13 DIAGNOSIS — I5022 Chronic systolic (congestive) heart failure: Secondary | ICD-10-CM | POA: Diagnosis not present

## 2021-04-15 DIAGNOSIS — E1065 Type 1 diabetes mellitus with hyperglycemia: Secondary | ICD-10-CM | POA: Diagnosis not present

## 2021-04-16 ENCOUNTER — Other Ambulatory Visit: Payer: Self-pay

## 2021-04-16 ENCOUNTER — Encounter (HOSPITAL_COMMUNITY)
Admission: RE | Admit: 2021-04-16 | Discharge: 2021-04-16 | Disposition: A | Discharge status: Home / Self Care | Payer: Medicare HMO | Source: Ambulatory Visit | Attending: Pulmonary Disease | Admitting: Pulmonary Disease

## 2021-04-16 VITALS — Wt 229.7 lb

## 2021-04-16 DIAGNOSIS — J449 Chronic obstructive pulmonary disease, unspecified: Secondary | ICD-10-CM

## 2021-04-16 NOTE — Progress Notes (Signed)
Daily Session Note  Patient Details  Name: Roger Lowe. MRN: [id number] Date of Birth: August 05, 1946 Referring Provider:   Flowsheet Row PULMONARY REHAB COPD ORIENTATION from 02/25/2021 in [place]  Referring Provider Dr. Valeta Harms       Encounter Date: 04/16/2021  Check In:  Session Check In - 04/16/21 1045       Check-In   Supervising physician immediately available to respond to emergencies [facility] MD immediately available    Physician(s) Dr. Domenic Polite    Location [facility]    Staff Present Geanie Cooley, RN;Dalton Fletcher, MS, ACSM-CEP, Exercise Physiologist    Virtual Visit No    Medication changes reported     No    Fall or balance concerns reported    No    Comments using cane for balance.    Tobacco Cessation No Change    Warm-up and Cool-down Performed as group-led instruction    Resistance Training Performed Yes    VAD Patient? No    PAD/SET Patient? No      Pain Assessment   Currently in Pain? No/denies    Pain Score 4     Pain Location Back    Pain Orientation Lower    Pain Descriptors / Indicators Aching    Pain Type Chronic pain    Pain Onset More than a month ago    Pain Frequency Constant    Pain Relieving Factors took pain meds this am    Multiple Pain Sites No             Capillary Blood Glucose: No results found for this or any previous visit (from the past 24 hour(s)).    Social History   Tobacco Use  Smoking Status Former   Packs/day: 1.00   Years: 20.00   Pack years: 20.00   Types: Cigarettes   Start date: 1968   Quit date: 09/04/2009   Years since quitting: 11.6  Smokeless Tobacco Never    Goals Met:  Proper associated with RPD/PD & O2 Sat [place] with exercise equipment Exercise tolerated well No report of cardiac concerns or symptoms Strength training completed today  Goals Unmet:  Not Applicable  Comments: check out @ 11:45am   Dr. Kathie Dike is Medical Director  for [facility].

## 2021-04-17 DIAGNOSIS — J449 Chronic obstructive pulmonary disease, unspecified: Secondary | ICD-10-CM | POA: Diagnosis not present

## 2021-04-17 NOTE — Progress Notes (Signed)
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 to explain home exercise prescription to exercise independently Short Term: Able to explain program exercise prescription;Long Term: Able to explain home exercise prescription to exercise independently              Exercise Goals Re-Evaluation :  Exercise Goals Re-Evaluation     Row Name 03/19/21 1214 04/16/21 1230           Exercise Goal Re-Evaluation   Exercise Goals Review Increase Strength and Stamina;Increase Physical Activity;Able to understand and use rate of perceived exertion (RPE) scale;Able to understand and use Dyspnea scale;Knowledge and understanding of Target Heart Rate Range (THRR);Understanding of Exercise Prescription Increase Strength and Stamina;Increase Physical Activity;Able to understand and use rate of perceived exertion (RPE) scale;Able to understand and use Dyspnea scale;Knowledge and understanding of  Target Heart Rate Range (THRR);Understanding of Exercise Prescription      Comments Pt has completed 2 exercise sessions. He is limited due to his balance and back pain, but he maintains a positive attitude and is motivated to progress in the program. He is currently exercising at 1.7 METs on the stepper. Will continue to monitor and progress as able. Pt has completed 8 exercise sessions. His progress has been limited due to deconditioning and back pain. He has been progressing slowly. He is currently exercising at 1.8 METs on the stepper. Will continue to monitor and progress as able.      Expected Outcomes Through exercise at  rehab and at home, the patient will meet their stated goals. Through exercise at rehab and at home, the patient will meet their stated goals.               Discharge Exercise Prescription (Final Exercise Prescription Changes):  Exercise Prescription Changes - 04/16/21 1200       Response to Exercise   Blood Pressure (Admit) 134/70    Blood Pressure (Exercise) 140/80    Blood Pressure (Exit) 134/82    Heart Rate (Admit) 91 bpm    Heart Rate (Exercise) 100 bpm    Heart Rate (Exit) 83 bpm    Oxygen Saturation (Admit) 99 %    Oxygen Saturation (Exercise) 98 %    Oxygen Saturation (Exit) 100 %    Rating of Perceived Exertion (Exercise) 13    Perceived Dyspnea (Exercise) 13    Duration Continue with 30 min of aerobic exercise without signs/symptoms of physical distress.    Intensity THRR unchanged      Progression   Progression Continue to progress workloads to maintain intensity without signs/symptoms of physical distress.      Resistance Training   Training Prescription Yes    Weight 3 lbs    Reps 10-15    Time 10 Minutes      Oxygen   Oxygen Continuous    Liters 3      NuStep   Level 1    SPM 73    Minutes 39    METs 1.8             Nutrition:  Target Goals: Understanding of nutrition guidelines, daily intake of sodium 1500mg , cholesterol  200mg , calories 30% from fat and 7% or less from saturated fats, daily to have 5 or more servings of fruits and vegetables.  Biometrics:  Pre Biometrics - 02/25/21 1552       Pre Biometrics   Height 6' (1.829 m)    Weight 102.7 kg    Waist Circumference 45.5 inches    Hip Circumference 45.5 inches    Waist to Hip Ratio 1 %    BMI (Calculated) 30.7    Triceps Skinfold 20 mm    % Body Fat 32.2 %    Grip Strength 35.9 kg    Flexibility 0 in    Single Leg Stand 2 seconds              Nutrition Therapy Plan and Nutrition Goals:  Nutrition Therapy & Goals - 03/13/21 1354       Personal Nutrition Goals   Comments Patient scored 24 on his diet assessment. We offer 2 educational sessions on heart healthy nutrition with handouts and offer assistance with RD referral if patient is interested.      Intervention Plan   Intervention Nutrition handout(s) given to patient.             Nutrition Assessments:  Nutrition Assessments - 02/25/21 1319       MEDFICTS Scores   Pre Score 24            MEDIFICTS Score Key: ?70 Need to make dietary changes  40-70 Heart Healthy Diet ? 40 Therapeutic Level Cholesterol Diet   Picture Your Plate Scores: <41 Unhealthy dietary pattern with much room for improvement. 41-50 Dietary pattern unlikely to meet recommendations for good health and room for improvement. 51-60 More healthful dietary pattern, with some room for improvement.  >60 Healthy dietary pattern, although there may be some specific behaviors that could  be improved.    Nutrition Goals Re-Evaluation:   Nutrition Goals Discharge (Final Nutrition Goals Re-Evaluation):   Psychosocial: Target Goals: Acknowledge presence or absence of significant depression and/or stress, maximize coping skills, provide positive support system. Participant is able to verbalize types and ability to use techniques and skills needed for reducing stress and depression.  Initial Review &  Psychosocial Screening:  Initial Psych Review & Screening - 02/25/21 1320       Initial Review   Current issues with Current Stress Concerns;Current Sleep Concerns    Source of Stress Concerns Family    Comments He is stressed about the  care of his grandchildren      [place]? Yes    Comments His support system is his three daughters. One lives in [place] and two other ones live in [place].      Barriers   Psychosocial barriers to participate in program The patient should benefit from training in stress management and relaxation.      Screening Interventions   Interventions Encouraged to exercise;To provide support and resources with identified psychosocial needs;Provide feedback about the scores to participant    Expected Outcomes Short Term goal: Utilizing psychosocial counselor, staff and physician to assist with identification of specific Stressors or current issues interfering with healing process. Setting desired goal for each stressor or current issue identified.;Long Term Goal: Stressors or current issues are controlled or eliminated.;Short Term goal: Identification and review with participant of any Quality of Life or Depression concerns found by scoring the questionnaire.;Long Term goal: The participant improves quality of Life and PHQ9 Scores as seen by post scores and/or verbalization of changes             Quality of Life Scores:  Quality of Life - 02/25/21 1546       Quality of Life   Select Quality of Life      Quality of Life Scores   Health/Function Pre 6.94 %    Socioeconomic Pre 8.71 %    Psych/Spiritual Pre 6.43 %    Family Pre 13 %    GLOBAL Pre 8.1 %            Scores of 19 and below usually indicate a poorer quality of life in these areas.  A difference of  2-3 points is a clinically meaningful difference.  A difference of 2-3 points in the total score of the Quality of Life Index has been associated with  significant improvement in overall quality of life, self-image, physical symptoms, and general health in studies assessing change in quality of life.   PHQ-9: Recent Review Flowsheet Data     Depression screen [facility] 2/9 02/25/2021 10/04/2020 08/03/2020   Decreased Interest 2 0 0   Down, Depressed, Hopeless 1 0 0   PHQ - 2 Score 3 0 0   Altered sleeping 3 - -   Tired, decreased energy 3 - -   Change in appetite 3 - -   Feeling bad or failure about yourself  2 - -   Trouble concentrating 2 - -   Moving slowly or fidgety/restless 2 - -   Suicidal thoughts 1 - -   PHQ-9 Score 19 - -   Difficult doing work/chores Somewhat difficult - -      Interpretation of Total Score  Total Score Depression Severity:  1-4 = Minimal depression, 5-9 = Mild depression, 10-14 = Moderate depression, 15-19 = Moderately severe depression, 20-27 =  Severe depression   Psychosocial Evaluation and Intervention:  Psychosocial Evaluation - 02/25/21 1410       Psychosocial Evaluation & Interventions   Interventions Encouraged to exercise with the program and follow exercise prescription;Stress management education;Relaxation education    Comments Pt has no barriers to participating in pulmonary rehab. He reports that he is currently stressed and his stressors include how his grandchildren are being raised. His PHQ-9 score was a 19. I explained to him that this is a high score and offered to communicate with his PCP if he wanted a referral to a councelor. He declined. He states that he is currenlty unable to do any of his hobbies or things he enjoys due to his SOB. He is optimistic that the pulmonary rehab program will allow him to do some of these things again and will help him out psychosocially. His goals are to decrease his SOB and be able to return to some of his hobbies which include crafts. He is eager to start the program and is hopeful that this will increase his quality of life.    Expected Outcomes The patient's  PHQ-9 levels will decrease at discharge and his psychosocial issues will be lessened.    Continue Psychosocial Services  Follow up required by staff             Psychosocial Re-Evaluation:  Psychosocial Re-Evaluation     [place] Name 03/13/21 1350 04/10/21 0747           Psychosocial Re-Evaluation   Current issues with Current Stress Concerns;Current Sleep Concerns Current Stress Concerns;Current Sleep Concerns      Comments Patient new to the program and has not started yet. He plans to start tomorrow 03/14/21. We will continue to monitor. Patient continues to have no psychosocial barriers identified. He has completed 7 sessions. He demonstrates a positive outlook and demonstrates an interest in improving his health. We will continue to monitor.      Expected Outcomes Patient will have no psychosocial barriers identified. Patient will have no psychosocial barriers identified.      Interventions Relaxation education;Stress management education;Encouraged to attend Pulmonary Rehabilitation for the exercise Relaxation education;Stress management education;Encouraged to attend Pulmonary Rehabilitation for the exercise      Continue Psychosocial Services  No Follow up required No Follow up required             Initial Review   Source of Stress Concerns -- Family               Psychosocial Discharge (Final Psychosocial Re-Evaluation):  Psychosocial Re-Evaluation - 04/10/21 0747       Psychosocial Re-Evaluation   Current issues with Current Stress Concerns;Current Sleep Concerns    Comments Patient continues to have no psychosocial barriers identified. He has completed 7 sessions. He demonstrates a positive outlook and demonstrates an interest in improving his health. We will continue to monitor.    Expected Outcomes Patient will have no psychosocial barriers identified.    Interventions Relaxation education;Stress management education;Encouraged to attend Pulmonary Rehabilitation for  the exercise    Continue Psychosocial Services  No Follow up required      Initial Review   Source of Stress Concerns Family              Education: Education Goals: Education classes will be provided on a weekly basis, covering required topics. Participant will state understanding/return demonstration of topics presented.  Learning Barriers/Preferences:  Learning Barriers/Preferences - 02/25/21 1322  Learning Barriers/Preferences   Animal nutritionist Material             Education Topics: How Lungs Work and Diseases: - Discuss the anatomy of the lungs and diseases that can affect the lungs, such as COPD.   Exercise: -Discuss the importance of exercise, FITT principles of exercise, normal and abnormal responses to exercise, and how to exercise safely.   Environmental Irritants: -Discuss types of environmental irritants and how to limit exposure to environmental irritants. Flowsheet Row PULMONARY REHAB CHRONIC OBSTRUCTIVE PULMONARY DISEASE from 04/04/2021 in [place]  Date 03/14/21  Educator DJ  Instruction Review Code 1- Verbalizes Understanding       Meds/Inhalers and oxygen: - Discuss respiratory medications, definition of an inhaler and oxygen, and the proper way to use an inhaler and oxygen.   Energy Saving Techniques: - Discuss methods to conserve energy and decrease shortness of breath when performing activities of daily living.  Flowsheet Row PULMONARY REHAB CHRONIC OBSTRUCTIVE PULMONARY DISEASE from 04/04/2021 in [place]  Date 03/28/21  Educator pb  Instruction Review Code 1- Verbalizes Understanding       Bronchial Hygiene / Breathing Techniques: - Discuss breathing mechanics, pursed-lip breathing technique,  proper posture, effective ways to clear airways, and other functional breathing techniques Flowsheet Row PULMONARY REHAB CHRONIC OBSTRUCTIVE  PULMONARY DISEASE from 04/04/2021 in [place]  Date 04/04/21  Educator Handout  Instruction Review Code 1- Research scientist (medical): - Provides group verbal and written instruction about the health risks of elevated stress, cause of high stress, and healthy ways to reduce stress.   Nutrition I: Fats: - Discuss the types of cholesterol, what cholesterol does to the body, and how cholesterol levels can be controlled.   Nutrition II: Labels: -Discuss the different components of food labels and how to read food labels.   Respiratory Infections: - Discuss the signs and symptoms of respiratory infections, ways to prevent respiratory infections, and the importance of seeking medical treatment when having a respiratory infection.   Stress I: Signs and Symptoms: - Discuss the causes of stress, how stress may lead to anxiety and depression, and ways to limit stress.   Stress II: Relaxation: -Discuss relaxation techniques to limit stress.   Oxygen for Home/Travel: - Discuss how to prepare for travel when on oxygen and proper ways to transport and store oxygen to ensure safety.   Knowledge Questionnaire Score:  Knowledge Questionnaire Score - 02/25/21 1326       Knowledge Questionnaire Score   Pre Score 15/18             Core Components/Risk Factors/Patient Goals at Admission:  Personal Goals and Risk Factors at Admission - 02/25/21 1327       Core Components/Risk Factors/Patient Goals on Admission    Weight Management Yes;Obesity;Weight Maintenance    Intervention Obesity: Provide education and appropriate resources to help participant work on and attain dietary goals.;Weight Management/Obesity: Establish reasonable short term and long term weight goals.;Weight Management: Provide education and appropriate resources to help participant work on and attain dietary goals.;Weight Management: Develop a combined nutrition and  exercise program designed to reach desired caloric intake, while maintaining appropriate intake of nutrient and fiber, sodium and fats, and appropriate energy expenditure required for the weight goal.    Expected Outcomes Short Term: Continue to assess and modify interventions until short term weight is achieved;Long Term: Adherence to  nutrition and physical activity/exercise program aimed toward attainment of established weight goal;Weight Maintenance: Understanding of the daily nutrition guidelines, which includes 25-35% calories from fat, 7% or less cal from saturated fats, less than 200mg  cholesterol, less than 1.5gm of sodium, & 5 or more servings of fruits and vegetables daily;Weight Loss: Understanding of general recommendations for a balanced deficit meal plan, which promotes 1-2 lb weight loss per week and includes a negative energy balance of [phone] kcal/d;Understanding recommendations for meals to include 15-35% energy as protein, 25-35% energy from fat, 35-60% energy from carbohydrates, less than 200mg  of dietary cholesterol, 20-35 gm of total fiber daily;Understanding of distribution of calorie intake throughout the day with the consumption of 4-5 meals/snacks    Improve shortness of breath with ADL's Yes    Intervention Provide education, individualized exercise plan and daily activity instruction to help decrease symptoms of SOB with activities of daily living.    Expected Outcomes Short Term: Improve cardiorespiratory fitness to achieve a reduction of symptoms when performing ADLs;Long Term: Be able to perform more ADLs without symptoms or delay the onset of symptoms    Increase knowledge of respiratory medications and ability to use respiratory devices properly  Yes    Intervention Provide education and demonstration as needed of appropriate use of medications, inhalers, and oxygen therapy.    Expected Outcomes Short Term: Achieves understanding of medications use. Understands that oxygen  is a medication prescribed by physician. Demonstrates appropriate use of inhaler and oxygen therapy.;Long Term: Maintain appropriate use of medications, inhalers, and oxygen therapy.    Diabetes Yes    Intervention Provide education about signs/symptoms and action to take for hypo/hyperglycemia.;Provide education about proper nutrition, including hydration, and aerobic/resistive exercise prescription along with prescribed medications to achieve blood glucose in normal ranges: Fasting glucose 65-99 mg/dL    Expected Outcomes Short Term: Participant verbalizes understanding of the signs/symptoms and immediate care of hyper/hypoglycemia, proper foot care and importance of medication, aerobic/resistive exercise and nutrition plan for blood glucose control.;Long Term: Attainment of HbA1C < 7%.    Heart Failure Yes    Intervention Provide a combined exercise and nutrition program that is supplemented with education, support and counseling about heart failure. Directed toward relieving symptoms such as shortness of breath, decreased exercise tolerance, and extremity edema.    Expected Outcomes Improve functional capacity of life;Short term: Attendance in program 2-3 days a week with increased exercise capacity. Reported lower sodium intake. Reported increased fruit and vegetable intake. Reports medication compliance.;Short term: Daily weights obtained and reported for increase. Utilizing diuretic protocols set by physician.;Long term: Adoption of self-care skills and reduction of barriers for early signs and symptoms recognition and intervention leading to self-care maintenance.    Hypertension Yes    Intervention Provide education on lifestyle modifcations including regular physical activity/exercise, weight management, moderate sodium restriction and increased consumption of fresh fruit, vegetables, and low fat dairy, alcohol moderation, and smoking cessation.;Monitor prescription use compliance.    Expected  Outcomes Short Term: Continued assessment and intervention until BP is < 140/9mm HG in hypertensive participants. < 130/29mm HG in hypertensive participants with diabetes, heart failure or chronic kidney disease.;Long Term: Maintenance of blood pressure at goal levels.    Lipids Yes    Intervention Provide education and support for participant on nutrition & aerobic/resistive exercise along with prescribed medications to achieve LDL 70mg , HDL >40mg .    Expected Outcomes Short Term: Participant states understanding of desired cholesterol values and is compliant with medications prescribed. Participant is  following exercise prescription and nutrition guidelines.;Long Term: Cholesterol controlled with medications as prescribed, with individualized exercise RX and with personalized nutrition plan. Value goals: LDL < 70mg , HDL > 40 mg.    Stress Yes    Intervention Offer individual and/or small group education and counseling on adjustment to heart disease, stress management and health-related lifestyle change. Teach and support self-help strategies.;Refer participants experiencing significant psychosocial distress to appropriate mental health specialists for further evaluation and treatment. When possible, include family members and significant others in education/counseling sessions.    Expected Outcomes Short Term: Participant demonstrates changes in health-related behavior, relaxation and other stress management skills, ability to obtain effective social support, and compliance with psychotropic medications if prescribed.;Long Term: Emotional wellbeing is indicated by absence of clinically significant psychosocial distress or social isolation.             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 Valeta Harms. He will start the program 03/14/21. His personal goals for the program are to decrease his SOB and be able to return to his hobbies/crafts and ADL's. We will continue to monitor his progress as he works towards meeting these goals. Patient has completed 7 sessions losing 1 lb since last 30 day review. He is doing well in the program with consistent attendance. He exercises on 3L with saturations running 96-98%. He is hypertensive at times. We will continue to monitor this. His personal goals for the program are to decrease his SOB; return to his hobbies/crafts and ADL's. We will continue to monitor his progress as he works towards meeting these goals.      Expected Outcomes Patient will complete the program meeting both personal and program goals. Patient will complete the program meeting both personal and program goals.               Core Components/Risk Factors/Patient Goals at Discharge (Final Review):   Goals and Risk Factor Review - 04/10/21 0748       Core Components/Risk Factors/Patient Goals Review   Personal Goals Review Weight Management/Obesity;Improve shortness of breath with ADL's    Review Patient has completed 7 sessions losing 1 lb since last 30 day review. He is doing well in the program with consistent attendance. He exercises on 3L with saturations running 96-98%. He is hypertensive at times. We will continue to monitor this. His personal goals for the program are to decrease his SOB; return to his hobbies/crafts and ADL's. We will continue to monitor his progress as he works towards meeting these goals.    Expected Outcomes Patient will complete the program meeting both personal and program goals.             ITP Comments:   Comments: ITP REVIEW Pt is making expected progress toward pulmonary rehab  goals after completing 9 sessions. Recommend continued exercise, life style modification, education, and utilization of breathing techniques to increase stamina and strength and decrease shortness of breath with exertion.

## 2021-04-18 ENCOUNTER — Encounter: Payer: Self-pay | Admitting: *Deleted

## 2021-04-18 ENCOUNTER — Encounter (HOSPITAL_COMMUNITY): Payer: Medicare HMO

## 2021-04-18 DIAGNOSIS — E1165 Type 2 diabetes mellitus with hyperglycemia: Secondary | ICD-10-CM | POA: Diagnosis not present

## 2021-04-19 ENCOUNTER — Encounter: Payer: Self-pay | Admitting: Family Medicine

## 2021-04-19 ENCOUNTER — Ambulatory Visit (INDEPENDENT_AMBULATORY_CARE_PROVIDER_SITE_OTHER): Payer: Medicare HMO | Admitting: Family Medicine

## 2021-04-19 ENCOUNTER — Other Ambulatory Visit: Payer: Self-pay

## 2021-04-19 VITALS — BP 138/80 | HR 76 | Ht 72.0 in | Wt 233.0 lb

## 2021-04-19 DIAGNOSIS — Z95810 Presence of automatic (implantable) cardiac defibrillator: Secondary | ICD-10-CM | POA: Diagnosis not present

## 2021-04-19 DIAGNOSIS — I10 Essential (primary) hypertension: Secondary | ICD-10-CM | POA: Diagnosis not present

## 2021-04-19 DIAGNOSIS — I5022 Chronic systolic (congestive) heart failure: Secondary | ICD-10-CM | POA: Diagnosis not present

## 2021-04-19 DIAGNOSIS — I2510 Atherosclerotic heart disease of native coronary artery without angina pectoris: Secondary | ICD-10-CM

## 2021-04-19 MED ORDER — ISOSORBIDE MONONITRATE ER 30 MG PO TB24: 30.0000 mg | ORAL_TABLET | Freq: Every day | ORAL | 6 refills | Status: DC

## 2021-04-19 MED ORDER — HYDRALAZINE HCL 50 MG PO TABS: 50.0000 mg | ORAL_TABLET | Freq: Three times a day (TID) | ORAL | 3 refills | Status: DC
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
## 2021-04-19 NOTE — Patient Instructions (Addendum)
Medication Instructions:  Increase Hydralazine to 50mg  three times a day. Begin Imdur 30mg  daily. Continue all other medications.  Labwork: None  Testing/Procedures: None  Follow-Up: Your physician recommends that you schedule a follow-up appointment in: 6 months  Any Other Special Instructions Will Be Listed Below (If Applicable).  If you need a refill on your cardiac medications before your next appointment, please call your pharmacy.

## 2021-04-23 ENCOUNTER — Encounter (HOSPITAL_COMMUNITY)
Admission: RE | Admit: 2021-04-23 | Discharge: 2021-04-23 | Disposition: A | Discharge status: Home / Self Care | Payer: Medicare HMO | Source: Ambulatory Visit | Attending: Pulmonary Disease | Admitting: Pulmonary Disease

## 2021-04-23 ENCOUNTER — Other Ambulatory Visit: Payer: Self-pay

## 2021-04-23 DIAGNOSIS — J449 Chronic obstructive pulmonary disease, unspecified: Secondary | ICD-10-CM | POA: Diagnosis not present

## 2021-04-23 NOTE — Progress Notes (Signed)
Daily Session Note  Patient Details  Name: Roger Lowe. MRN: [id number] Date of Birth: [birth year] Referring Provider:   Flowsheet Row PULMONARY REHAB COPD ORIENTATION from 02/25/2021 in [place]  Referring Provider Dr. Valeta Harms       Encounter Date: 04/23/2021  Check In:  Session Check In - 04/23/21 1045       Check-In   Supervising physician immediately available to respond to emergencies [facility] MD immediately available    Physician(s) Dr. Harl Bowie    Location [facility]    Staff Present Geanie Cooley, RN;Dalton Fletcher, MS, ACSM-CEP, Exercise Physiologist    Virtual Visit No    Medication changes reported     No    Fall or balance concerns reported    No    Tobacco Cessation No Change    Warm-up and Cool-down Performed as group-led instruction    Resistance Training Performed Yes    VAD Patient? No    PAD/SET Patient? No      Pain Assessment   Currently in Pain? Yes    Pain Score 4     Pain Location Back    Pain Orientation Lower    Pain Descriptors / Indicators Aching    Pain Type Chronic pain    Pain Onset More than a month ago    Pain Frequency Constant    Multiple Pain Sites No             Capillary Blood Glucose: No results found for this or any previous visit (from the past 24 hour(s)).    Social History   Tobacco Use  Smoking Status Former   Packs/day: 1.00   Years: 20.00   Pack years: 20.00   Types: Cigarettes   Start date: 1968   Quit date: 09/04/2009   Years since quitting: 11.6  Smokeless Tobacco Never    Goals Met:  Proper associated with RPD/PD & O2 Sat Independence with exercise equipment Improved SOB with ADL's Exercise tolerated well No report of cardiac concerns or symptoms Strength training completed today  Goals Unmet:  Not Applicable  Comments: check out @ 11:45am   Dr. Kathie Dike is Medical Director for [facility] Pul[facility].

## 2021-04-25 ENCOUNTER — Encounter (HOSPITAL_COMMUNITY): Payer: Medicare HMO

## 2021-04-30 ENCOUNTER — Encounter (HOSPITAL_COMMUNITY): Payer: Medicare HMO

## 2021-05-02 ENCOUNTER — Encounter (HOSPITAL_COMMUNITY)
Admission: RE | Admit: 2021-05-02 | Discharge: 2021-05-02 | Disposition: A | Discharge status: Home / Self Care | Payer: Medicare HMO | Source: Ambulatory Visit | Attending: Pulmonary Disease | Admitting: Pulmonary Disease

## 2021-05-02 ENCOUNTER — Other Ambulatory Visit: Payer: Self-pay

## 2021-05-02 DIAGNOSIS — J449 Chronic obstructive pulmonary disease, unspecified: Secondary | ICD-10-CM | POA: Insufficient documentation

## 2021-05-02 NOTE — Progress Notes (Addendum)
Daily Session Note  Patient Details  Name: Roger Lowe. MRN: [id number] Date of Birth: May 18, 1946 Referring Provider:   Flowsheet Row PULMONARY REHAB COPD ORIENTATION from 02/25/2021 in [place]  Referring Provider Dr. Valeta Harms       Encounter Date: 05/02/2021  Check In:  Session Check In - 05/02/21 1045       Check-In   Supervising physician immediately available to respond to emergencies [facility] MD immediately available    Physician(s) Dr. Harl Bowie    Location [facility]    Staff Present Aundra Dubin, RN, Bjorn Loser, MS, ACSM-CEP, Exercise Physiologist    Virtual Visit No    Medication changes reported     Yes    Comments Cardiology added Imdur 30 mg daily and increased hydralazine to 50 mg BID last week.    Fall or balance concerns reported    Yes    Comments using cane for balance.    Tobacco Cessation No Change    Warm-up and Cool-down Performed as group-led instruction    Resistance Training Performed Yes    VAD Patient? No    PAD/SET Patient? No      Pain Assessment   Currently in Pain? No/denies    Multiple Pain Sites No             Capillary Blood Glucose: No results found for this or any previous visit (from the past 24 hour(s)).    Social History   Tobacco Use  Smoking Status Former   Packs/day: 1.00   Years: 20.00   Pack years: 20.00   Types: Cigarettes   Start date: 1968   Quit date: 09/04/2009   Years since quitting: 11.6  Smokeless Tobacco Never    Goals Met:  Proper associated with RPD/PD & O2 Sat Independence with exercise equipment Using PLB without cueing & demonstrates good technique Exercise tolerated well No report of concerns or symptoms today Strength training completed today  Goals Unmet:  Not Applicable  Comments: Check out 1145.   Dr. Kathie Dike is Medical Director for [facility] Pulmonary Re[facility].

## 2021-05-06 ENCOUNTER — Emergency Department (HOSPITAL_COMMUNITY): Payer: Medicare HMO

## 2021-05-06 ENCOUNTER — Inpatient Hospital Stay (HOSPITAL_COMMUNITY)
Admission: EM | Admit: 2021-05-06 | Discharge: 2021-05-09 | DRG: 378 | Disposition: A | Discharge status: Home / Self Care | Payer: Medicare HMO | Attending: Internal Medicine | Admitting: Internal Medicine

## 2021-05-06 ENCOUNTER — Other Ambulatory Visit: Payer: Self-pay

## 2021-05-06 ENCOUNTER — Encounter (HOSPITAL_COMMUNITY): Payer: Self-pay

## 2021-05-06 DIAGNOSIS — J441 Chronic obstructive pulmonary disease with (acute) exacerbation: Secondary | ICD-10-CM | POA: Diagnosis not present

## 2021-05-06 DIAGNOSIS — Z95810 Presence of automatic (implantable) cardiac defibrillator: Secondary | ICD-10-CM

## 2021-05-06 DIAGNOSIS — E785 Hyperlipidemia, unspecified: Secondary | ICD-10-CM | POA: Diagnosis not present

## 2021-05-06 DIAGNOSIS — K922 Gastrointestinal hemorrhage, unspecified: Secondary | ICD-10-CM | POA: Diagnosis not present

## 2021-05-06 DIAGNOSIS — J9611 Chronic respiratory failure with hypoxia: Secondary | ICD-10-CM | POA: Diagnosis present

## 2021-05-06 DIAGNOSIS — R079 Chest pain, unspecified: Secondary | ICD-10-CM | POA: Diagnosis present

## 2021-05-06 DIAGNOSIS — R000 Tachycardia, unspecified: Secondary | ICD-10-CM | POA: Diagnosis not present

## 2021-05-06 DIAGNOSIS — Z7952 Long term (current) use of systemic steroids: Secondary | ICD-10-CM

## 2021-05-06 DIAGNOSIS — Z87891 Personal history of nicotine dependence: Secondary | ICD-10-CM

## 2021-05-06 DIAGNOSIS — Z794 Long term (current) use of insulin: Secondary | ICD-10-CM | POA: Diagnosis not present

## 2021-05-06 DIAGNOSIS — Z20822 Contact with and (suspected) exposure to covid-19: Secondary | ICD-10-CM | POA: Diagnosis present

## 2021-05-06 DIAGNOSIS — K219 Gastro-esophageal reflux disease without esophagitis: Secondary | ICD-10-CM | POA: Diagnosis present

## 2021-05-06 DIAGNOSIS — E11649 Type 2 diabetes mellitus with hypoglycemia without coma: Secondary | ICD-10-CM | POA: Diagnosis not present

## 2021-05-06 DIAGNOSIS — B3781 Candidal esophagitis: Secondary | ICD-10-CM | POA: Diagnosis present

## 2021-05-06 DIAGNOSIS — D6859 Other primary thrombophilia: Secondary | ICD-10-CM | POA: Diagnosis present

## 2021-05-06 DIAGNOSIS — M19012 Primary osteoarthritis, left shoulder: Secondary | ICD-10-CM | POA: Diagnosis not present

## 2021-05-06 DIAGNOSIS — Z66 Do not resuscitate: Secondary | ICD-10-CM | POA: Diagnosis present

## 2021-05-06 DIAGNOSIS — I428 Other cardiomyopathies: Secondary | ICD-10-CM | POA: Diagnosis present

## 2021-05-06 DIAGNOSIS — I2782 Chronic pulmonary embolism: Secondary | ICD-10-CM | POA: Diagnosis present

## 2021-05-06 DIAGNOSIS — Z79899 Other long term (current) drug therapy: Secondary | ICD-10-CM

## 2021-05-06 DIAGNOSIS — E119 Type 2 diabetes mellitus without complications: Secondary | ICD-10-CM

## 2021-05-06 DIAGNOSIS — I5042 Chronic combined systolic (congestive) and diastolic (congestive) heart failure: Secondary | ICD-10-CM | POA: Diagnosis present

## 2021-05-06 DIAGNOSIS — Z86718 Personal history of other venous thrombosis and embolism: Secondary | ICD-10-CM

## 2021-05-06 DIAGNOSIS — Z85118 Personal history of other malignant neoplasm of bronchus and lung: Secondary | ICD-10-CM | POA: Diagnosis not present

## 2021-05-06 DIAGNOSIS — Z9221 Personal history of antineoplastic chemotherapy: Secondary | ICD-10-CM

## 2021-05-06 DIAGNOSIS — I2510 Atherosclerotic heart disease of native coronary artery without angina pectoris: Secondary | ICD-10-CM | POA: Diagnosis present

## 2021-05-06 DIAGNOSIS — K920 Hematemesis: Secondary | ICD-10-CM | POA: Diagnosis present

## 2021-05-06 DIAGNOSIS — K5900 Constipation, unspecified: Secondary | ICD-10-CM | POA: Diagnosis not present

## 2021-05-06 DIAGNOSIS — I272 Pulmonary hypertension, unspecified: Secondary | ICD-10-CM | POA: Diagnosis present

## 2021-05-06 DIAGNOSIS — R0789 Other chest pain: Secondary | ICD-10-CM | POA: Diagnosis not present

## 2021-05-06 DIAGNOSIS — J449 Chronic obstructive pulmonary disease, unspecified: Secondary | ICD-10-CM | POA: Diagnosis not present

## 2021-05-06 DIAGNOSIS — Z7901 Long term (current) use of anticoagulants: Secondary | ICD-10-CM

## 2021-05-06 DIAGNOSIS — J439 Emphysema, unspecified: Secondary | ICD-10-CM | POA: Diagnosis not present

## 2021-05-06 DIAGNOSIS — I10 Essential (primary) hypertension: Secondary | ICD-10-CM | POA: Diagnosis present

## 2021-05-06 DIAGNOSIS — R911 Solitary pulmonary nodule: Secondary | ICD-10-CM

## 2021-05-06 DIAGNOSIS — I5022 Chronic systolic (congestive) heart failure: Secondary | ICD-10-CM | POA: Diagnosis not present

## 2021-05-06 DIAGNOSIS — Z79891 Long term (current) use of opiate analgesic: Secondary | ICD-10-CM

## 2021-05-06 DIAGNOSIS — Z8249 Family history of ischemic heart disease and other diseases of the circulatory system: Secondary | ICD-10-CM

## 2021-05-06 DIAGNOSIS — I700 Atherosclerosis of aorta: Secondary | ICD-10-CM | POA: Diagnosis not present

## 2021-05-06 DIAGNOSIS — E1142 Type 2 diabetes mellitus with diabetic polyneuropathy: Secondary | ICD-10-CM | POA: Diagnosis present

## 2021-05-06 DIAGNOSIS — J479 Bronchiectasis, uncomplicated: Secondary | ICD-10-CM | POA: Diagnosis not present

## 2021-05-06 DIAGNOSIS — I252 Old myocardial infarction: Secondary | ICD-10-CM

## 2021-05-06 DIAGNOSIS — E7800 Pure hypercholesterolemia, unspecified: Secondary | ICD-10-CM | POA: Diagnosis present

## 2021-05-06 DIAGNOSIS — K2970 Gastritis, unspecified, without bleeding: Secondary | ICD-10-CM | POA: Diagnosis not present

## 2021-05-06 DIAGNOSIS — R053 Chronic cough: Secondary | ICD-10-CM | POA: Diagnosis not present

## 2021-05-06 DIAGNOSIS — M25552 Pain in left hip: Secondary | ICD-10-CM | POA: Diagnosis not present

## 2021-05-06 DIAGNOSIS — Z8673 Personal history of transient ischemic attack (TIA), and cerebral infarction without residual deficits: Secondary | ICD-10-CM | POA: Diagnosis not present

## 2021-05-06 DIAGNOSIS — K2971 Gastritis, unspecified, with bleeding: Secondary | ICD-10-CM | POA: Diagnosis present

## 2021-05-06 DIAGNOSIS — Z7951 Long term (current) use of inhaled steroids: Secondary | ICD-10-CM

## 2021-05-06 DIAGNOSIS — I110 Hypertensive heart disease with heart failure: Secondary | ICD-10-CM | POA: Diagnosis present

## 2021-05-06 DIAGNOSIS — Z9181 History of falling: Secondary | ICD-10-CM

## 2021-05-06 DIAGNOSIS — I509 Heart failure, unspecified: Secondary | ICD-10-CM

## 2021-05-06 DIAGNOSIS — R918 Other nonspecific abnormal finding of lung field: Secondary | ICD-10-CM | POA: Diagnosis not present

## 2021-05-06 DIAGNOSIS — E1122 Type 2 diabetes mellitus with diabetic chronic kidney disease: Secondary | ICD-10-CM | POA: Diagnosis not present

## 2021-05-06 DIAGNOSIS — K319 Disease of stomach and duodenum, unspecified: Secondary | ICD-10-CM | POA: Diagnosis not present

## 2021-05-06 DIAGNOSIS — K5909 Other constipation: Secondary | ICD-10-CM | POA: Diagnosis present

## 2021-05-06 DIAGNOSIS — Z9981 Dependence on supplemental oxygen: Secondary | ICD-10-CM

## 2021-05-06 DIAGNOSIS — Z923 Personal history of irradiation: Secondary | ICD-10-CM

## 2021-05-06 DIAGNOSIS — Z043 Encounter for examination and observation following other accident: Secondary | ICD-10-CM | POA: Diagnosis not present

## 2021-05-06 DIAGNOSIS — M5090 Cervical disc disorder, unspecified, unspecified cervical region: Secondary | ICD-10-CM | POA: Insufficient documentation

## 2021-05-06 LAB — BASIC METABOLIC PANEL
Anion gap: 6 (ref 5–15)
BUN: 15 mg/dL (ref 8–23)
CO2: 28 mmol/L (ref 22–32)
Calcium: 9.1 mg/dL (ref 8.9–10.3)
Chloride: 103 mmol/L (ref 98–111)
Creatinine, Ser: 1.33 mg/dL — ABNORMAL HIGH (ref 0.61–1.24)
GFR, Estimated: 56 mL/min — ABNORMAL LOW (ref >60)
Glucose, Bld: 110 mg/dL — ABNORMAL HIGH (ref 70–99)
Potassium: 3.8 mmol/L (ref 3.5–5.1)
Sodium: 137 mmol/L (ref 135–145)

## 2021-05-06 LAB — HEPATIC FUNCTION PANEL
ALT: 20 U/L (ref 0–44)
AST: 20 U/L (ref 15–41)
Albumin: 3.6 g/dL (ref 3.5–5.0)
Alkaline Phosphatase: 82 U/L (ref 38–126)
Bilirubin, Direct: 0.2 mg/dL (ref 0.0–0.2)
Indirect Bilirubin: 0.4 mg/dL (ref 0.3–0.9)
Total Bilirubin: 0.6 mg/dL (ref 0.3–1.2)
Total Protein: 7.1 g/dL (ref 6.5–8.1)

## 2021-05-06 LAB — CBC
HCT: 49.7 % (ref 39.0–52.0)
Hemoglobin: 15.7 g/dL (ref 13.0–17.0)
MCH: 30 pg (ref 26.0–34.0)
MCHC: 31.6 g/dL (ref 30.0–36.0)
MCV: 95 fL (ref 80.0–100.0)
Platelets: 177 10*3/uL (ref 150–400)
RBC: 5.23 MIL/uL (ref 4.22–5.81)
RDW: 14.6 % (ref 11.5–15.5)
WBC: 7.5 10*3/uL (ref 4.0–10.5)
nRBC: 0 % (ref 0.0–0.2)

## 2021-05-06 LAB — LIPASE, BLOOD: Lipase: 21 U/L (ref 11–51)

## 2021-05-06 LAB — TROPONIN I (HIGH SENSITIVITY)
Troponin I (High Sensitivity): 11 ng/L (ref <18)
Troponin I (High Sensitivity): 11 ng/L (ref <18)

## 2021-05-06 IMAGING — CT CT ABD-PELV W/ CM
3 of 6 series · 13 of 36 positions shown, 14 images · IV contrast (omnipaque)
Comparison: CT chest [DATE], [DATE]

CLINICAL DATA: Chest pain, hematemesis, gastrointestinal hemorrhage

EXAM:
CT CHEST, ABDOMEN, AND PELVIS WITH CONTRAST
TECHNIQUE: Multidetector CT imaging of the chest, abdomen and pelvis was
performed following the standard protocol during bolus
administration of intravenous contrast.
CONTRAST:  80mL OMNIPAQUE IOHEXOL 350 MG/ML SOLN

[Series 2: cap with · axial · 0.66mm/px · z∈[+852,+1507]mm · 3 of 132 slices shown, 4 images]
[im 1/132  mediastinal]
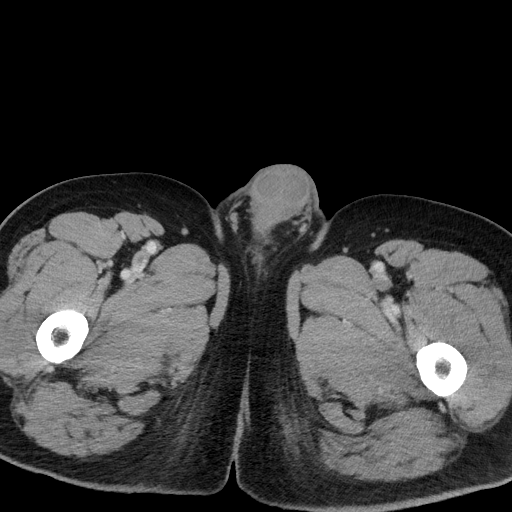
[im 1/132  lung]
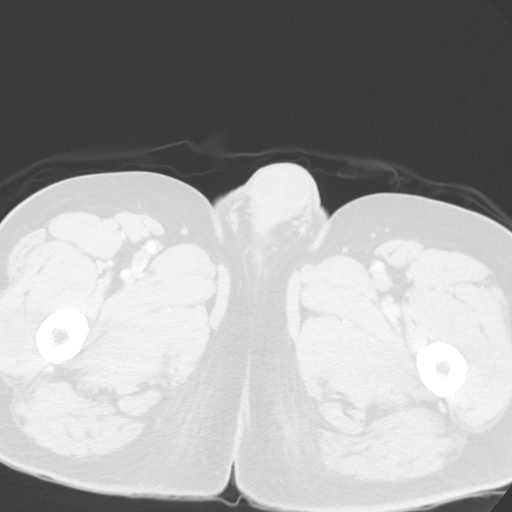
[im 66/132  lung]
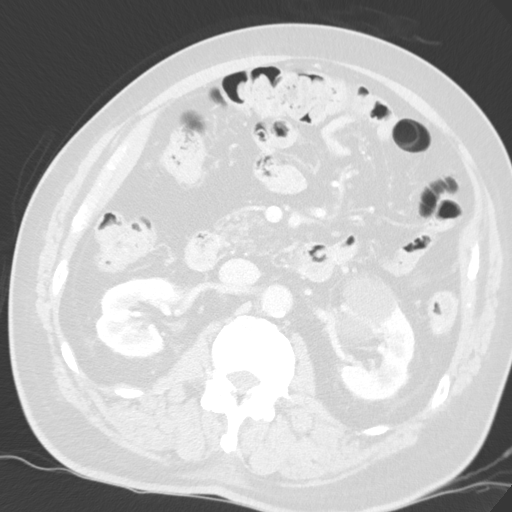
[im 132/132  lung]
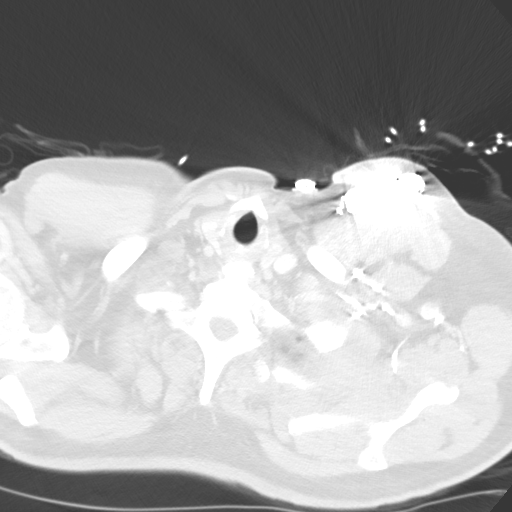

[Series 3: thins · axial · 0.66mm/px · z∈[+966,+1379]mm · 7 of 757 slices shown]
[im 80/757  lung]
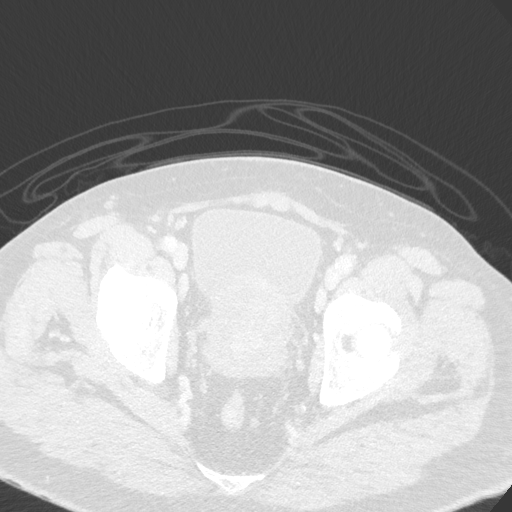
[im 160/757  lung]
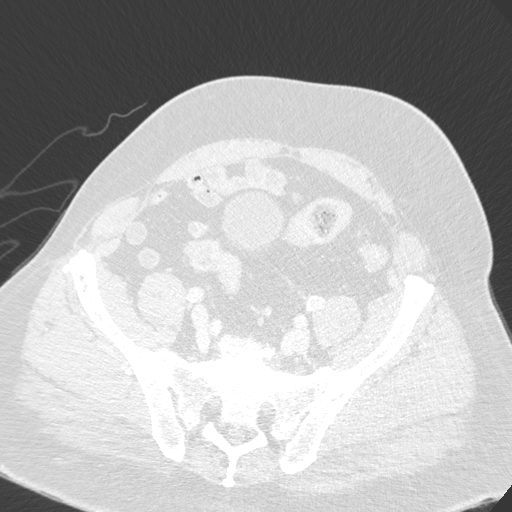
[im 239/757  lung]
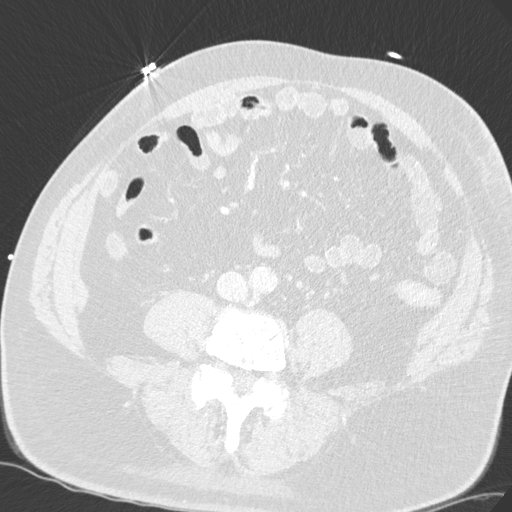
[im 319/757  lung]
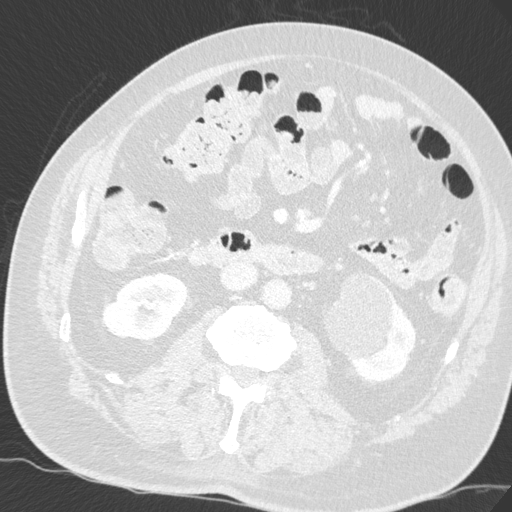
[im 438/757  lung]
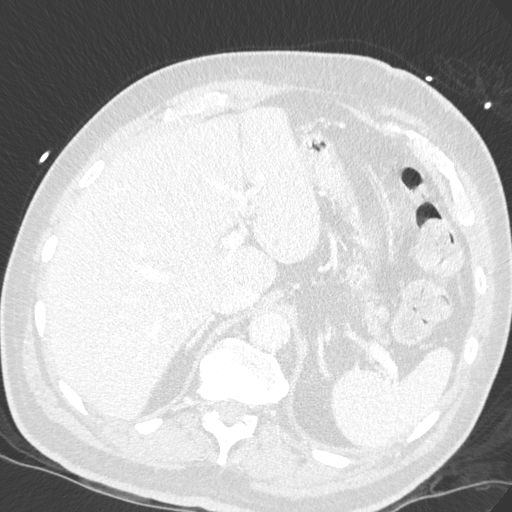
[im 518/757  lung]
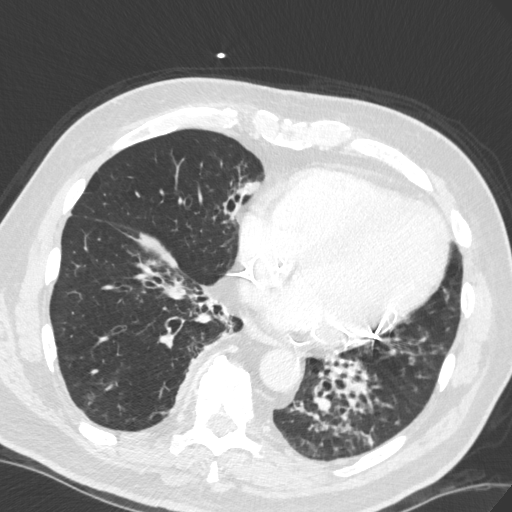
[im 597/757  lung]
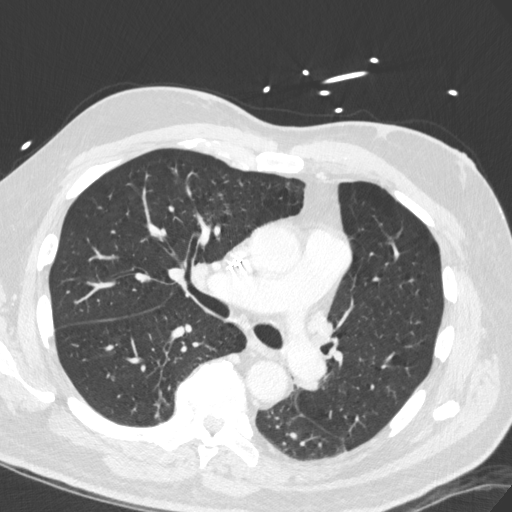

[Series 4: coronals · coronal · 0.75mm/px · 3 of 152 slices shown]
[im 31/152  lung]
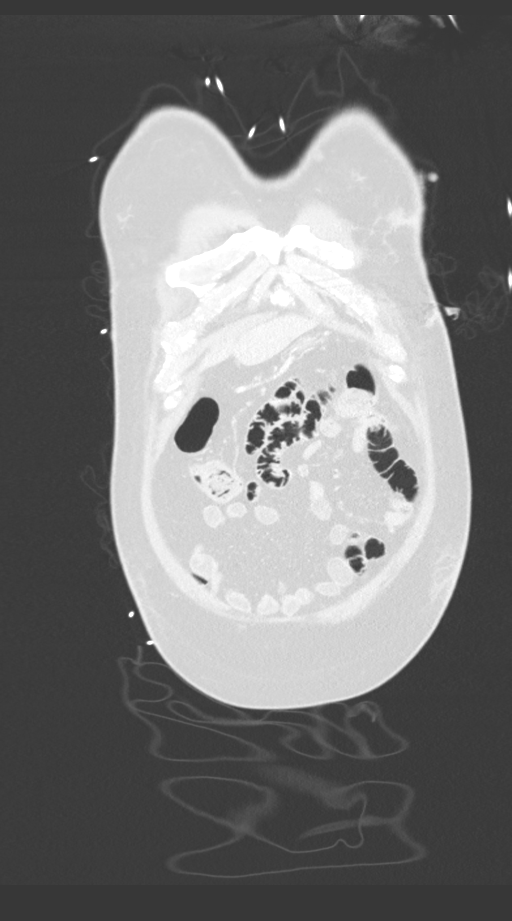
[im 61/152  lung]
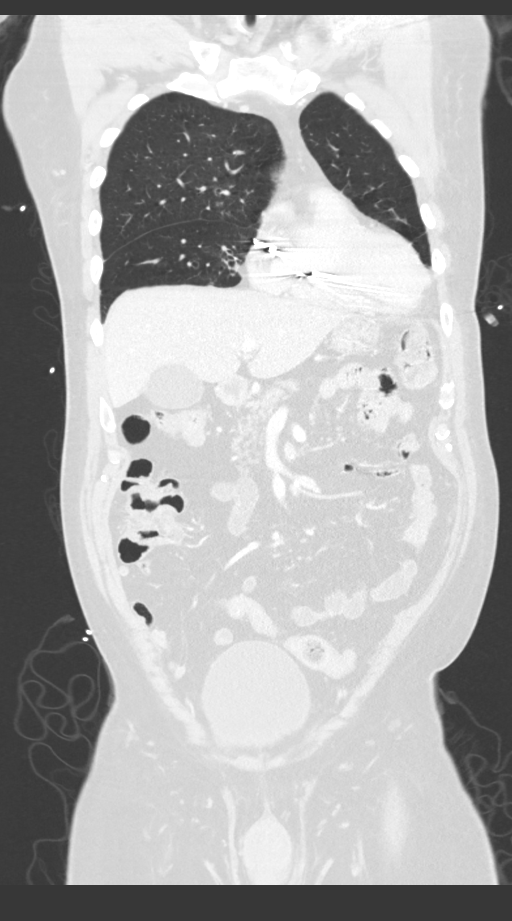
[im 91/152  lung]
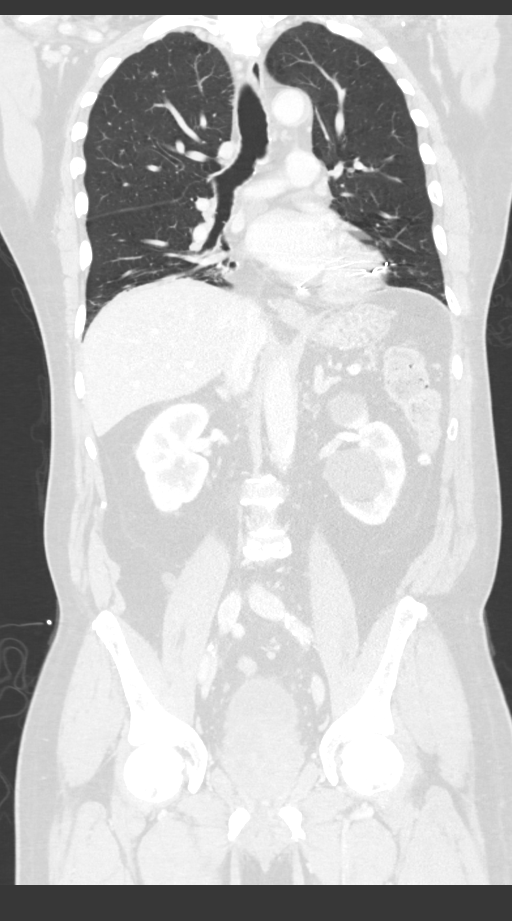

[13 of 36 positions shown; findings below may reference images not displayed]

FINDINGS: CT CHEST FINDINGS

Cardiovascular: No significant coronary artery calcification. Global
cardiac size within normal limits. Left subclavian pacemaker is in
place with leads within the right atrium, right ventricle toward the
apex, and left ventricular venous outflow. No pericardial effusion.
The central pulmonary arteries are of normal caliber. There is mild
dilation of the descending thoracic aorta, measuring 3.1 cm in
diameter at the level of the aortic isthmus. Distally, the
descending thoracic aorta measures 2.8 cm in diameter at the level
of the left atrium. The ascending aorta is not dilated.

Mediastinum/Nodes: A single enlarged aortopulmonary window lymph
node is seen measuring 14 mm in short axis diameter, axial image #
[DATE], nonspecific. This appears enlarged since prior examination. No
additional pathologic thoracic adenopathy. Visualized thyroid is
unremarkable. Esophagus is unremarkable.

Lungs/Pleura: Mild centrilobular emphysema. There is extensive
bibasilar cylindrical bronchiectasis, more severe within the left
lower lobe with associated consolidation and volume loss. There is
superimposed peribronchial nodularity within the left lower lobe and
basilar lingula which is stable from prior examination and likely
represents post inflammatory scarring.

A enlarging cavitary nodule is seen within the left lower lobe
measuring 13 mm x 15 mm at axial image # 83/7, previously measuring
8 mm x 12 mm and new from remote prior examination of [DATE]. An
enlarging primary bronchogenic neoplasm is favored.

No pneumothorax or pleural effusion.  No central obstructing lesion.

Musculoskeletal: No acute bone abnormality. No lytic or blastic bone
lesion.

CT ABDOMEN PELVIS FINDINGS

Hepatobiliary: No focal liver abnormality is seen. No gallstones,
gallbladder wall thickening, or biliary dilatation.

Pancreas: Unremarkable

Spleen: Unremarkable

Adrenals/Urinary Tract: The adrenal glands are unremarkable. The
kidneys are normal in size and position. Multiple simple cortical
cysts are seen within the left kidney. The kidneys are otherwise
unremarkable. The bladder is unremarkable.

Stomach/Bowel: Stomach is within normal limits. Appendix appears
normal. No evidence of bowel wall thickening, distention, or
inflammatory changes. No free intraperitoneal gas or fluid.

Vascular/Lymphatic: Moderate aortoiliac atherosclerotic
calcification. No aortic aneurysm. No pathologic adenopathy within
the abdomen and pelvis.

Reproductive: Marked prostatic enlargement noted.

Other: Tiny broad-based fat containing umbilical hernia. Rectum is
unremarkable.

Musculoskeletal: No acute bone abnormality. No lytic or blastic bone
lesion. Degenerative changes are seen within the lumbar spine.
IMPRESSION: Mean 14 mm cavitary nodule within the a left lower lobe
demonstrating progressive enlargement since remote prior
examinations suspicious for a primary bronchogenic neoplasm.
Consider one of the following in 3 months for both low-risk and
high-risk individuals: (a) repeat chest CT, (b) follow-up PET-CT, or
(c) tissue sampling. This recommendation follows the consensus
statement: Guidelines for Management of Incidental Pulmonary Nodules
Detected on CT Images: From the [HOSPITAL] [OD]; Radiology
[OD]; [DATE].

Mild emphysema.

Bibasilar bronchiectasis and stable peribronchial nodularity, likely
post inflammatory in nature.

Mild dilation of the a proximal descending thoracic aorta. Recommend
annual imaging followup by CTA or MRA. This recommendation follows
[OD] ACCF/AHA/AATS/ACR/ASA/SCA/GIORGI/GIORGI/GIORGI/GIORGI Guidelines for the
Diagnosis and Management of Patients with Thoracic Aortic Disease.
Circulation.[OD]; 121: [id number]. Aortic aneurysm NOS ([OD]-[OD])

No acute intra-abdominal pathology.

Aortic Atherosclerosis ([OD]-[OD]) and Emphysema ([OD]-[OD]).

## 2021-05-06 IMAGING — CT CT CHEST W/ CM
2 of 5 series · 11 of 36 positions shown, 13 images · IV contrast (Omnipaque or Isovue)
Comparison: CT chest [DATE], [DATE]

CLINICAL DATA: Chest pain, hematemesis, gastrointestinal hemorrhage

EXAM:
CT CHEST, ABDOMEN, AND PELVIS WITH CONTRAST
TECHNIQUE: Multidetector CT imaging of the chest, abdomen and pelvis was
performed following the standard protocol during bolus
administration of intravenous contrast.
CONTRAST:  80mL OMNIPAQUE IOHEXOL 350 MG/ML SOLN

[Series 2: cap with · axial · 0.66mm/px · z∈[+907,+1447]mm · 8 of 132 slices shown, 10 images]
[im 12/132  mediastinal]
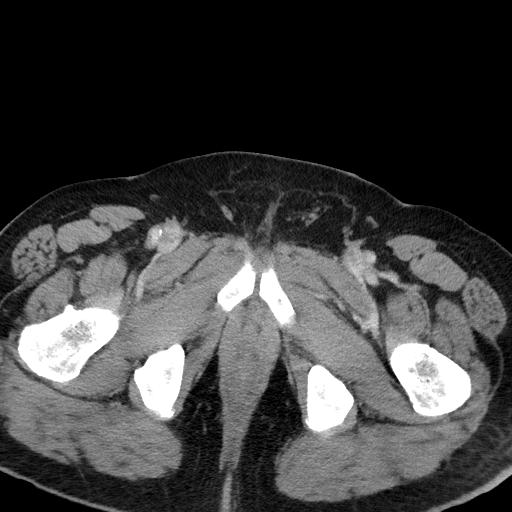
[im 12/132  lung]
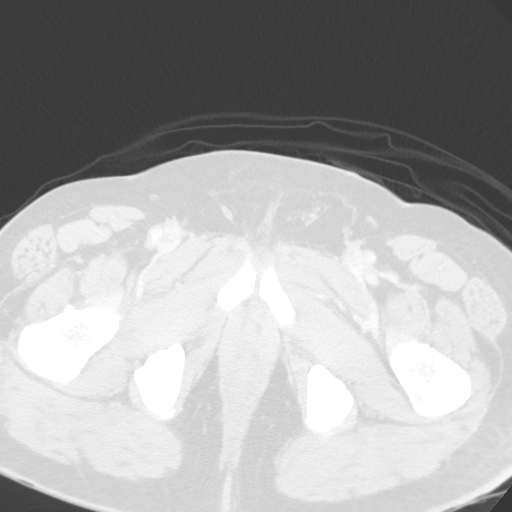
[im 24/132  lung]
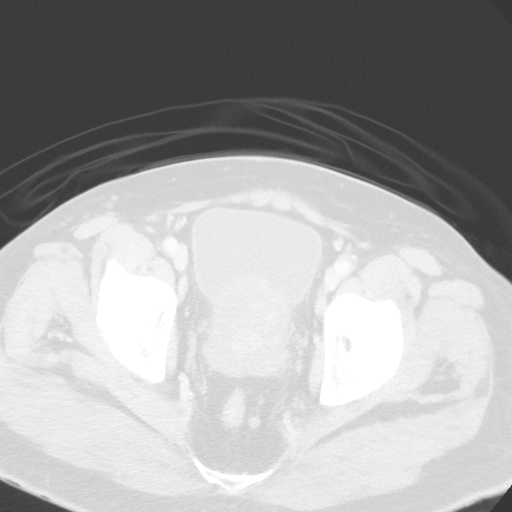
[im 48/132  lung]
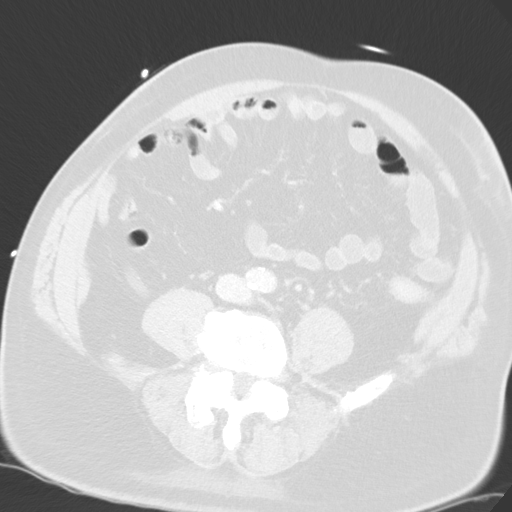
[im 60/132  lung]
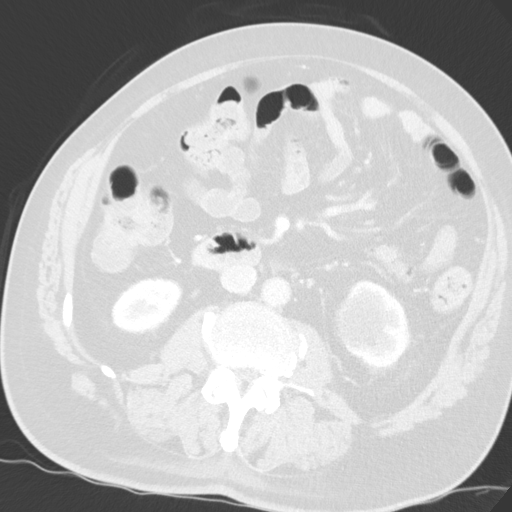
[im 72/132  mediastinal]
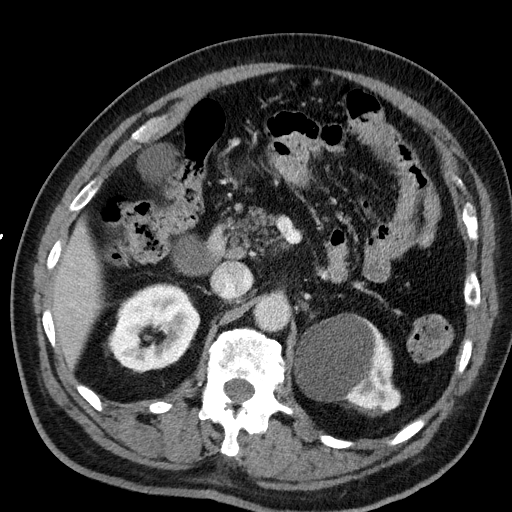
[im 72/132  lung]
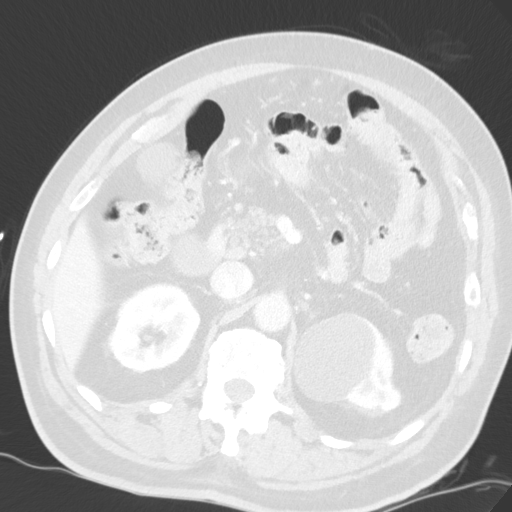
[im 84/132  lung]
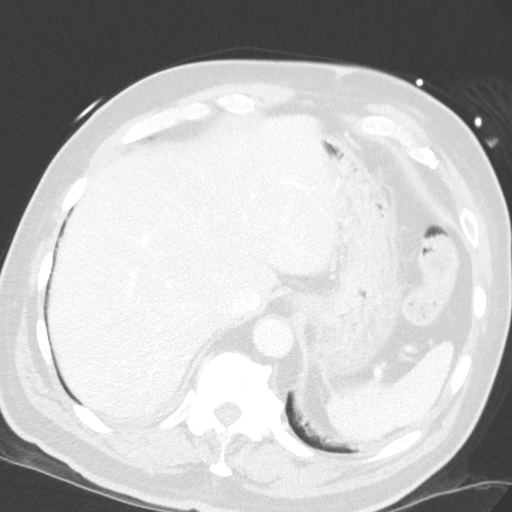
[im 108/132  lung]
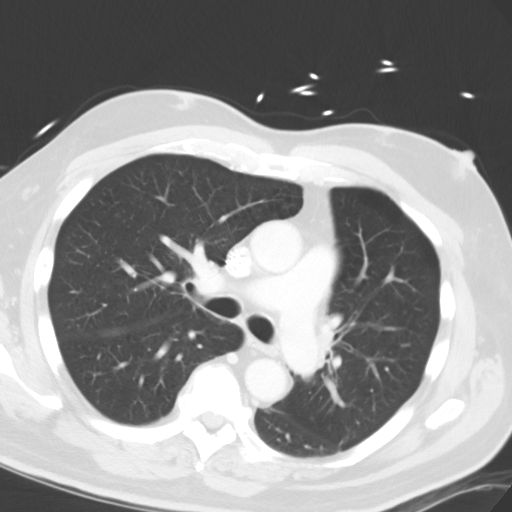
[im 120/132  lung]
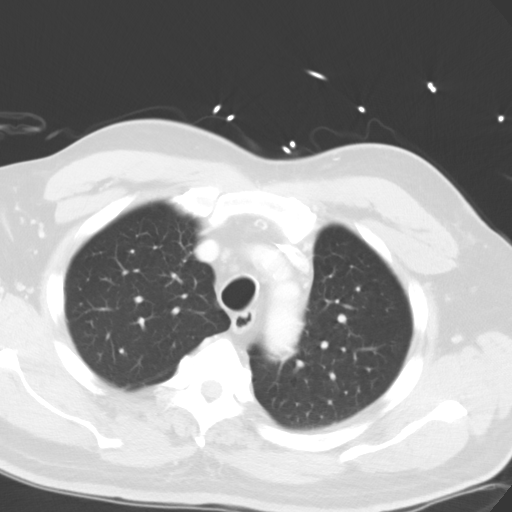

[Series 4: coronals · coronal · 0.75mm/px · 3 of 152 slices shown]
[im 31/152  lung]
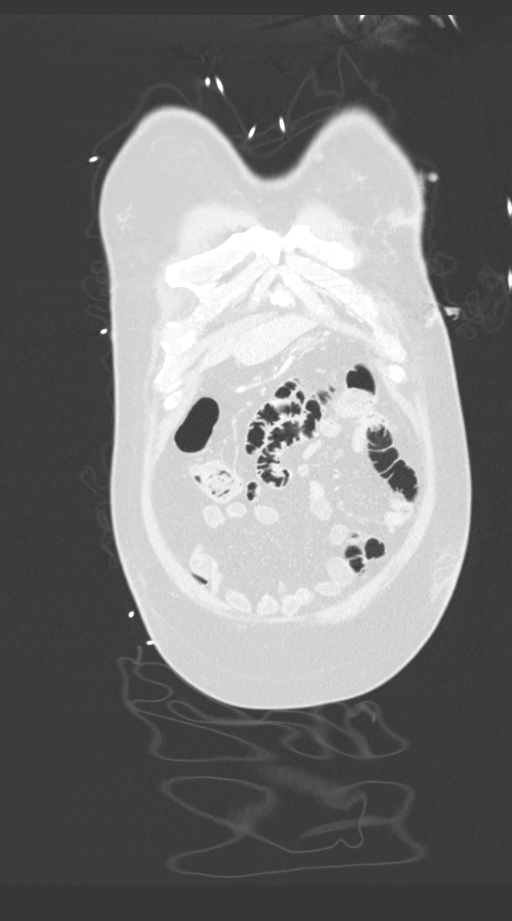
[im 61/152  lung]
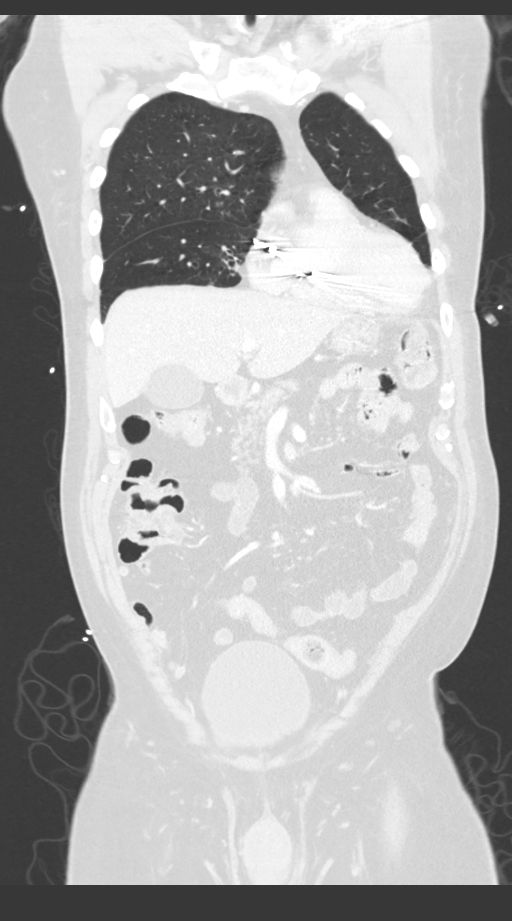
[im 91/152  lung]
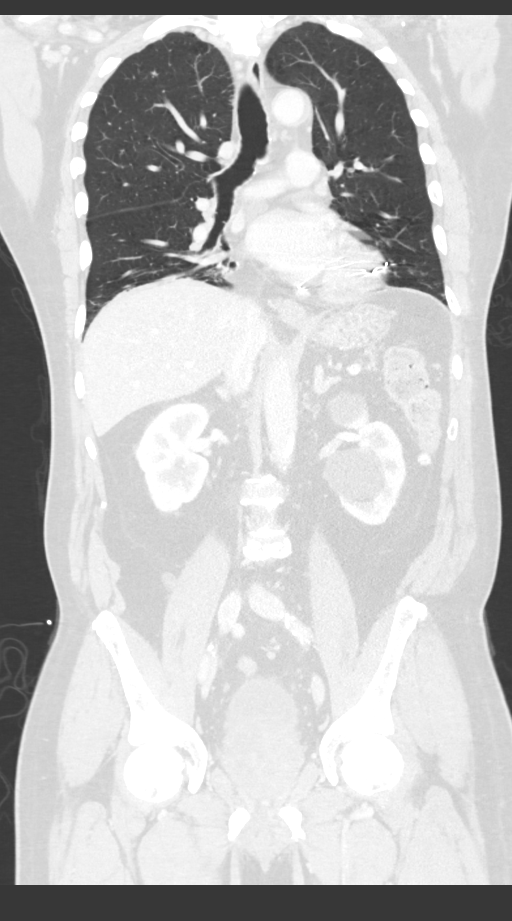

[11 of 36 positions shown; findings below may reference images not displayed]

FINDINGS: CT CHEST FINDINGS

Cardiovascular: No significant coronary artery calcification. Global
cardiac size within normal limits. Left subclavian pacemaker is in
place with leads within the right atrium, right ventricle toward the
apex, and left ventricular venous outflow. No pericardial effusion.
The central pulmonary arteries are of normal caliber. There is mild
dilation of the descending thoracic aorta, measuring 3.1 cm in
diameter at the level of the aortic isthmus. Distally, the
descending thoracic aorta measures 2.8 cm in diameter at the level
of the left atrium. The ascending aorta is not dilated.

Mediastinum/Nodes: A single enlarged aortopulmonary window lymph
node is seen measuring 14 mm in short axis diameter, axial image #
[DATE], nonspecific. This appears enlarged since prior examination. No
additional pathologic thoracic adenopathy. Visualized thyroid is
unremarkable. Esophagus is unremarkable.

Lungs/Pleura: Mild centrilobular emphysema. There is extensive
bibasilar cylindrical bronchiectasis, more severe within the left
lower lobe with associated consolidation and volume loss. There is
superimposed peribronchial nodularity within the left lower lobe and
basilar lingula which is stable from prior examination and likely
represents post inflammatory scarring.

A enlarging cavitary nodule is seen within the left lower lobe
measuring 13 mm x 15 mm at axial image # 83/7, previously measuring
8 mm x 12 mm and new from remote prior examination of [DATE]. An
enlarging primary bronchogenic neoplasm is favored.

No pneumothorax or pleural effusion.  No central obstructing lesion.

Musculoskeletal: No acute bone abnormality. No lytic or blastic bone
lesion.

CT ABDOMEN PELVIS FINDINGS

Hepatobiliary: No focal liver abnormality is seen. No gallstones,
gallbladder wall thickening, or biliary dilatation.

Pancreas: Unremarkable

Spleen: Unremarkable

Adrenals/Urinary Tract: The adrenal glands are unremarkable. The
kidneys are normal in size and position. Multiple simple cortical
cysts are seen within the left kidney. The kidneys are otherwise
unremarkable. The bladder is unremarkable.

Stomach/Bowel: Stomach is within normal limits. Appendix appears
normal. No evidence of bowel wall thickening, distention, or
inflammatory changes. No free intraperitoneal gas or fluid.

Vascular/Lymphatic: Moderate aortoiliac atherosclerotic
calcification. No aortic aneurysm. No pathologic adenopathy within
the abdomen and pelvis.

Reproductive: Marked prostatic enlargement noted.

Other: Tiny broad-based fat containing umbilical hernia. Rectum is
unremarkable.

Musculoskeletal: No acute bone abnormality. No lytic or blastic bone
lesion. Degenerative changes are seen within the lumbar spine.
IMPRESSION: Mean 14 mm cavitary nodule within the a left lower lobe
demonstrating progressive enlargement since remote prior
examinations suspicious for a primary bronchogenic neoplasm.
Consider one of the following in 3 months for both low-risk and
high-risk individuals: (a) repeat chest CT, (b) follow-up PET-CT, or
(c) tissue sampling. This recommendation follows the consensus
statement: Guidelines for Management of Incidental Pulmonary Nodules
Detected on CT Images: From the [HOSPITAL] [OD]; Radiology
[OD]; [DATE].

Mild emphysema.

Bibasilar bronchiectasis and stable peribronchial nodularity, likely
post inflammatory in nature.

Mild dilation of the a proximal descending thoracic aorta. Recommend
annual imaging followup by CTA or MRA. This recommendation follows
[OD] ACCF/AHA/AATS/ACR/ASA/SCA/GIORGI/GIORGI/GIORGI/GIORGI Guidelines for the
Diagnosis and Management of Patients with Thoracic Aortic Disease.
Circulation.[OD]; 121: [id number]. Aortic aneurysm NOS ([OD]-[OD])

No acute intra-abdominal pathology.

Aortic Atherosclerosis ([OD]-[OD]) and Emphysema ([OD]-[OD]).

## 2021-05-06 IMAGING — DX DG CHEST 2V
2 series · 2 of 2 positions shown · non-contrast
Comparison: Prior chest radiographs [DATE] and earlier.

CLINICAL DATA: Chest pain. Worsening cough for 2 weeks. Chronic
cough.

EXAM:
CHEST - 2 VIEW

[chest lat]
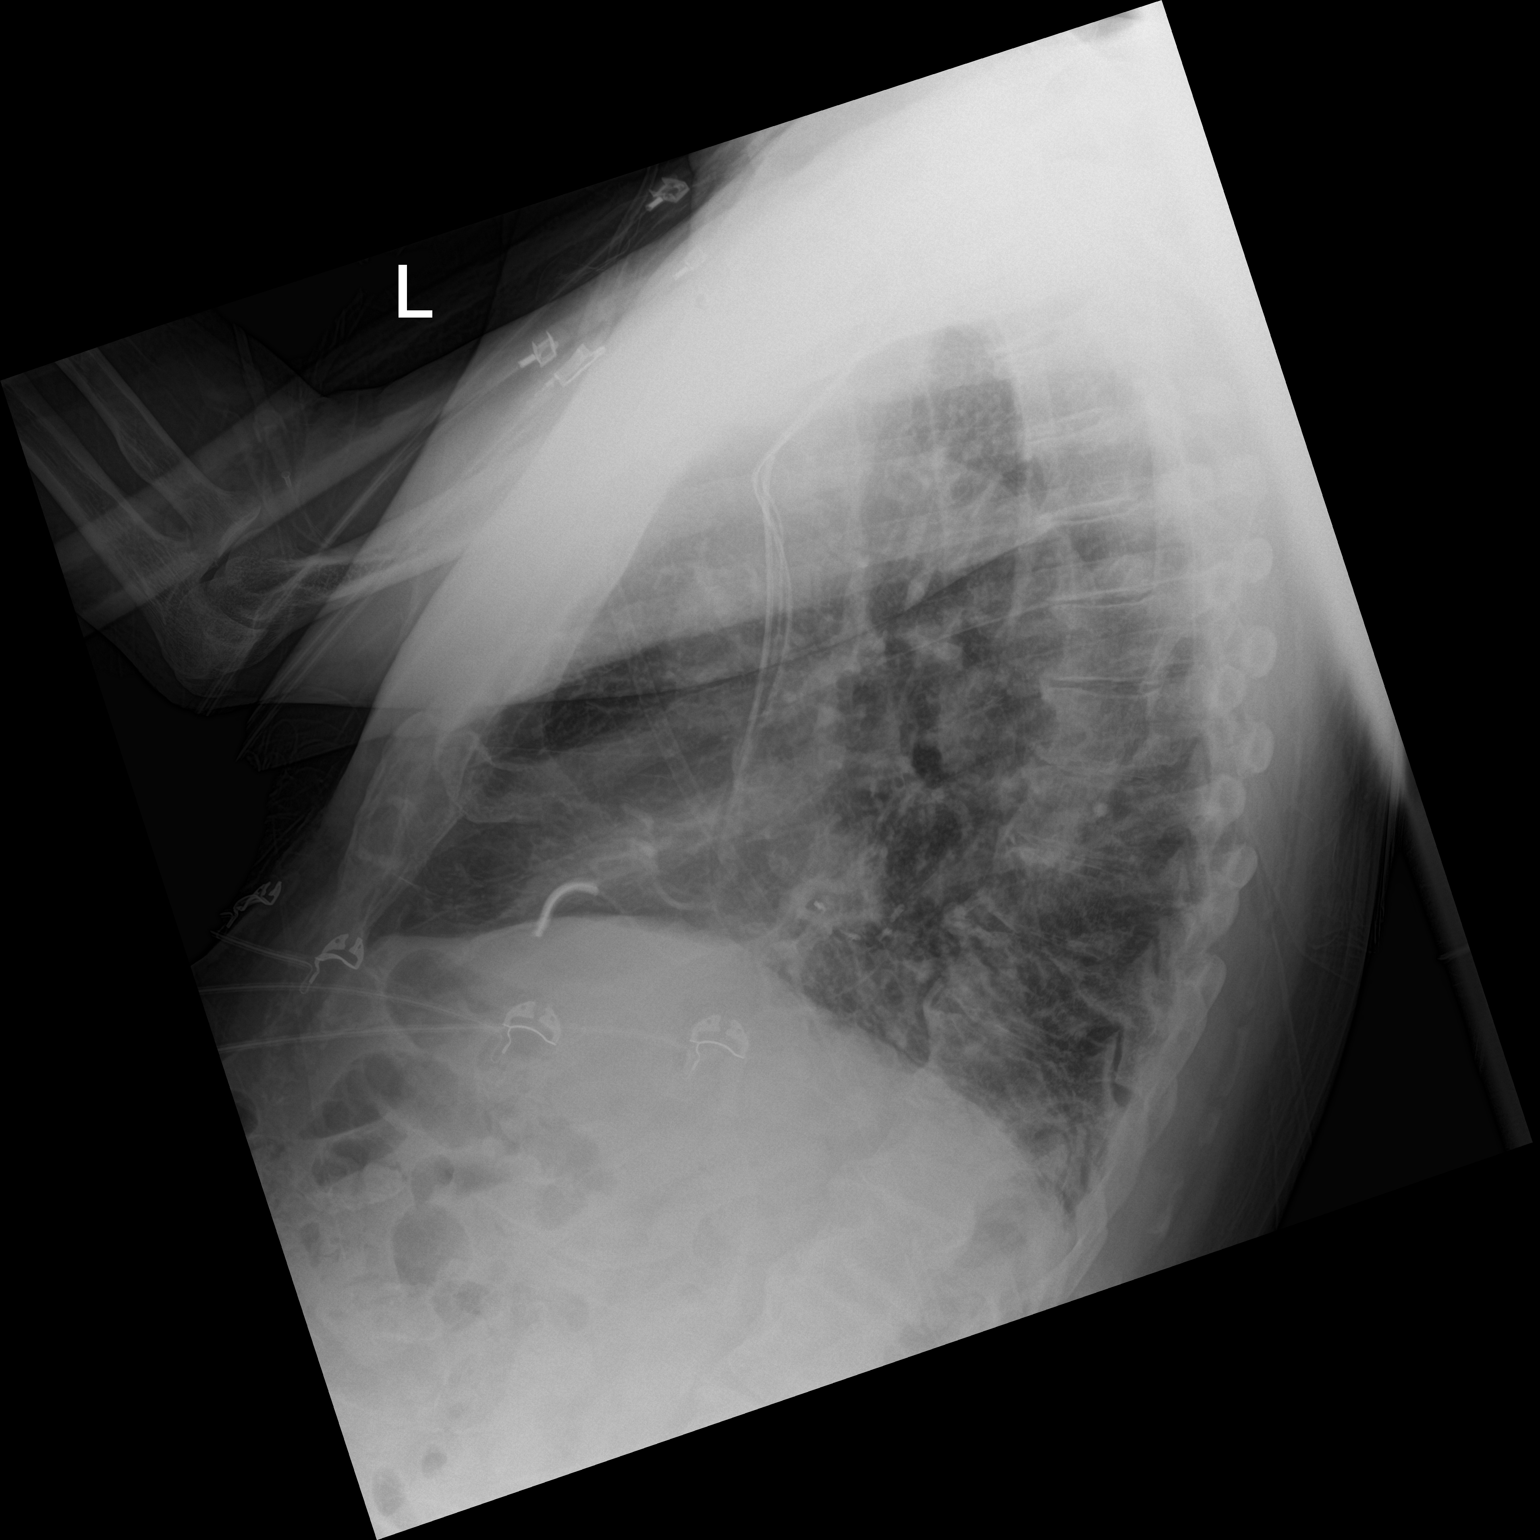

[chest ap]
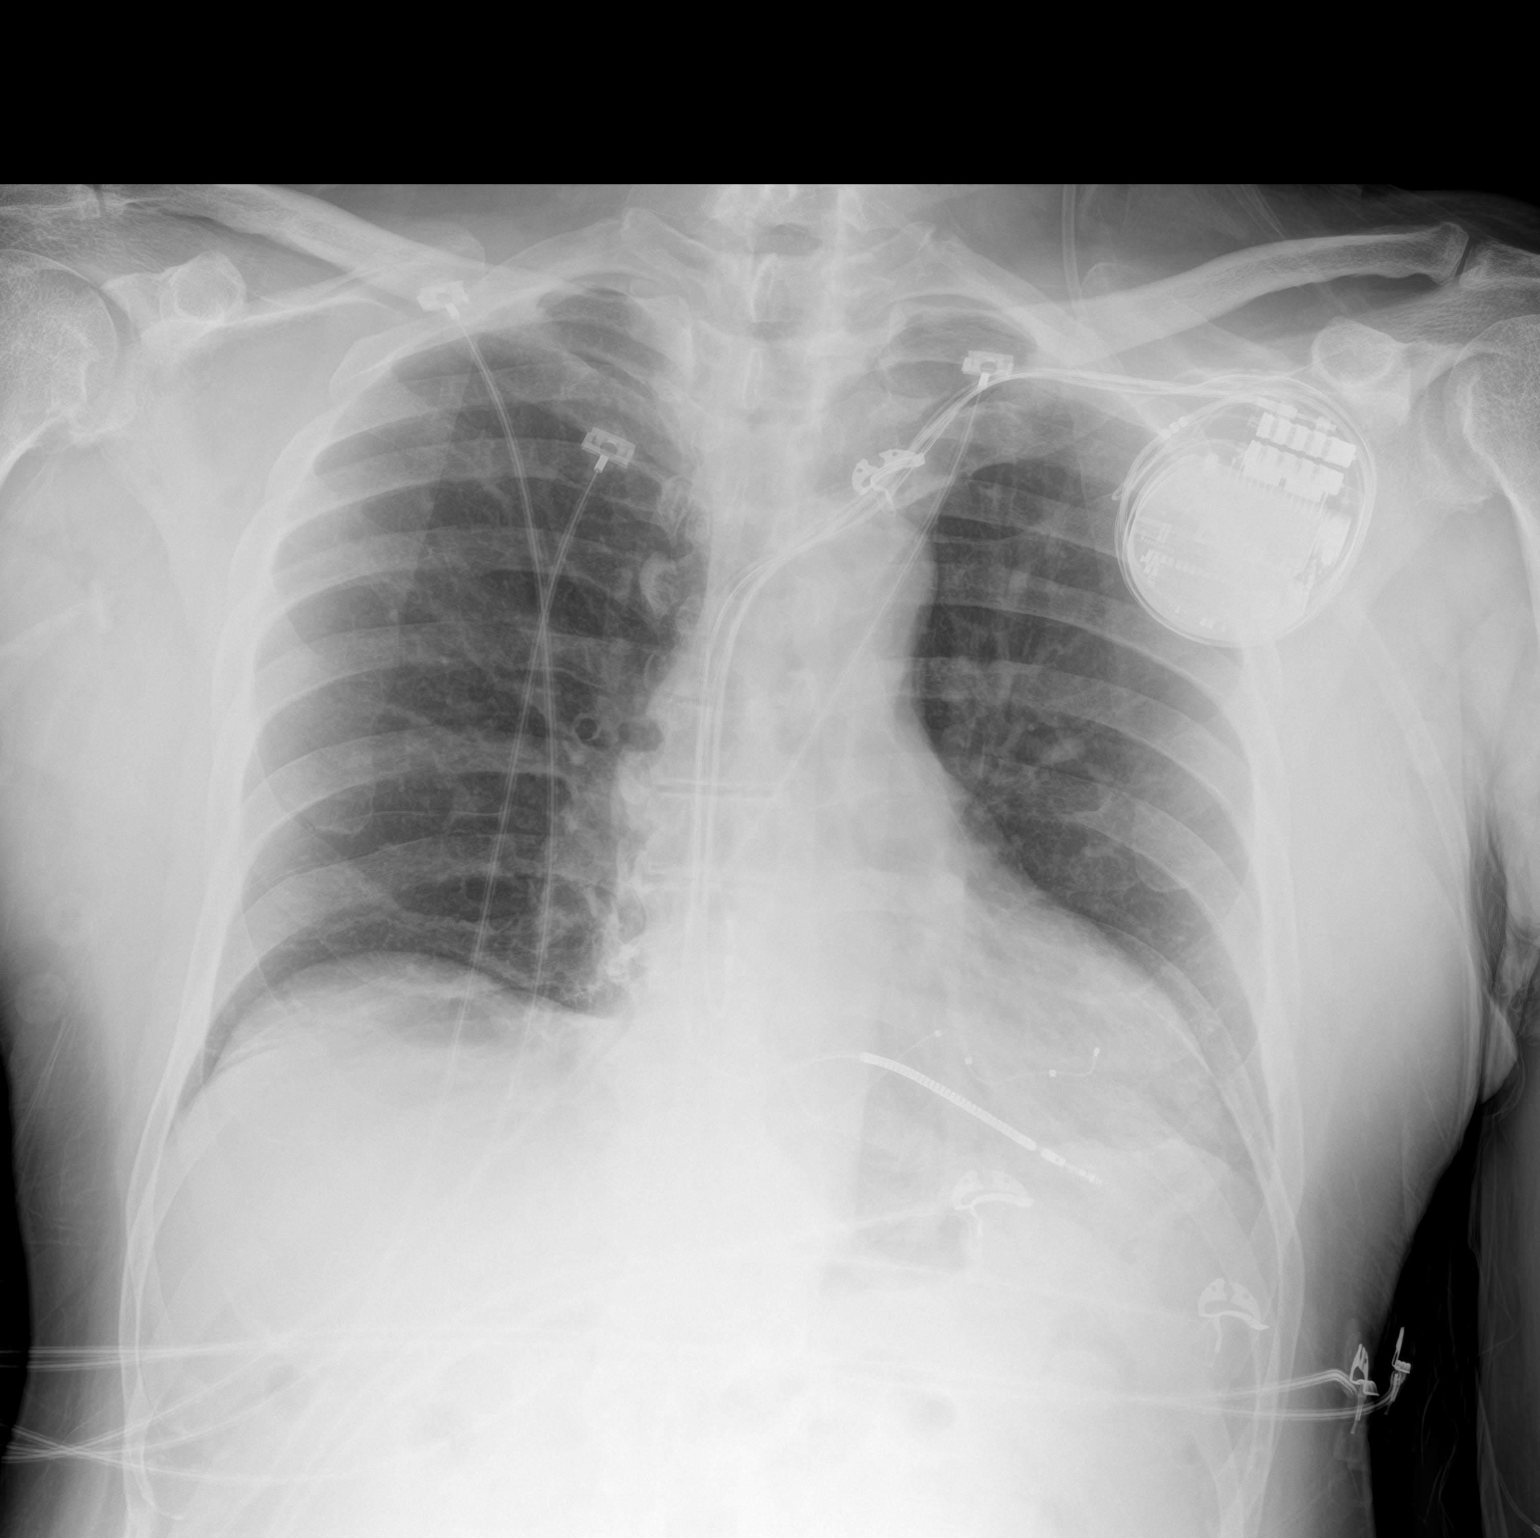

[2 of 2 positions shown; findings below may reference images not displayed]

FINDINGS: Left chest multi lead implantable cardiac device. Heart size at the
upper limits of normal. Mild ill-defined opacity within the left
lung base. The right lung is clear. No evidence of pleural effusion
or pneumothorax. No acute bony abnormality identified. Degenerative
changes of the spine.
IMPRESSION: Mild ill-defined opacity within the left lung base, which may
reflect atelectasis. Pneumonia is difficult to definitively exclude.
Clinical correlation is recommended. Additionally, consider
short-interval radiographic follow-up.

## 2021-05-06 IMAGING — DX DG HIP (WITH OR WITHOUT PELVIS) 2-3V*L*
3 series · 3 of 3 positions shown · non-contrast
Comparison: None

CLINICAL DATA: Fall, left hip pain

EXAM:
DG HIP (WITH OR WITHOUT PELVIS) 2-3V LEFT

[pelvis ap]
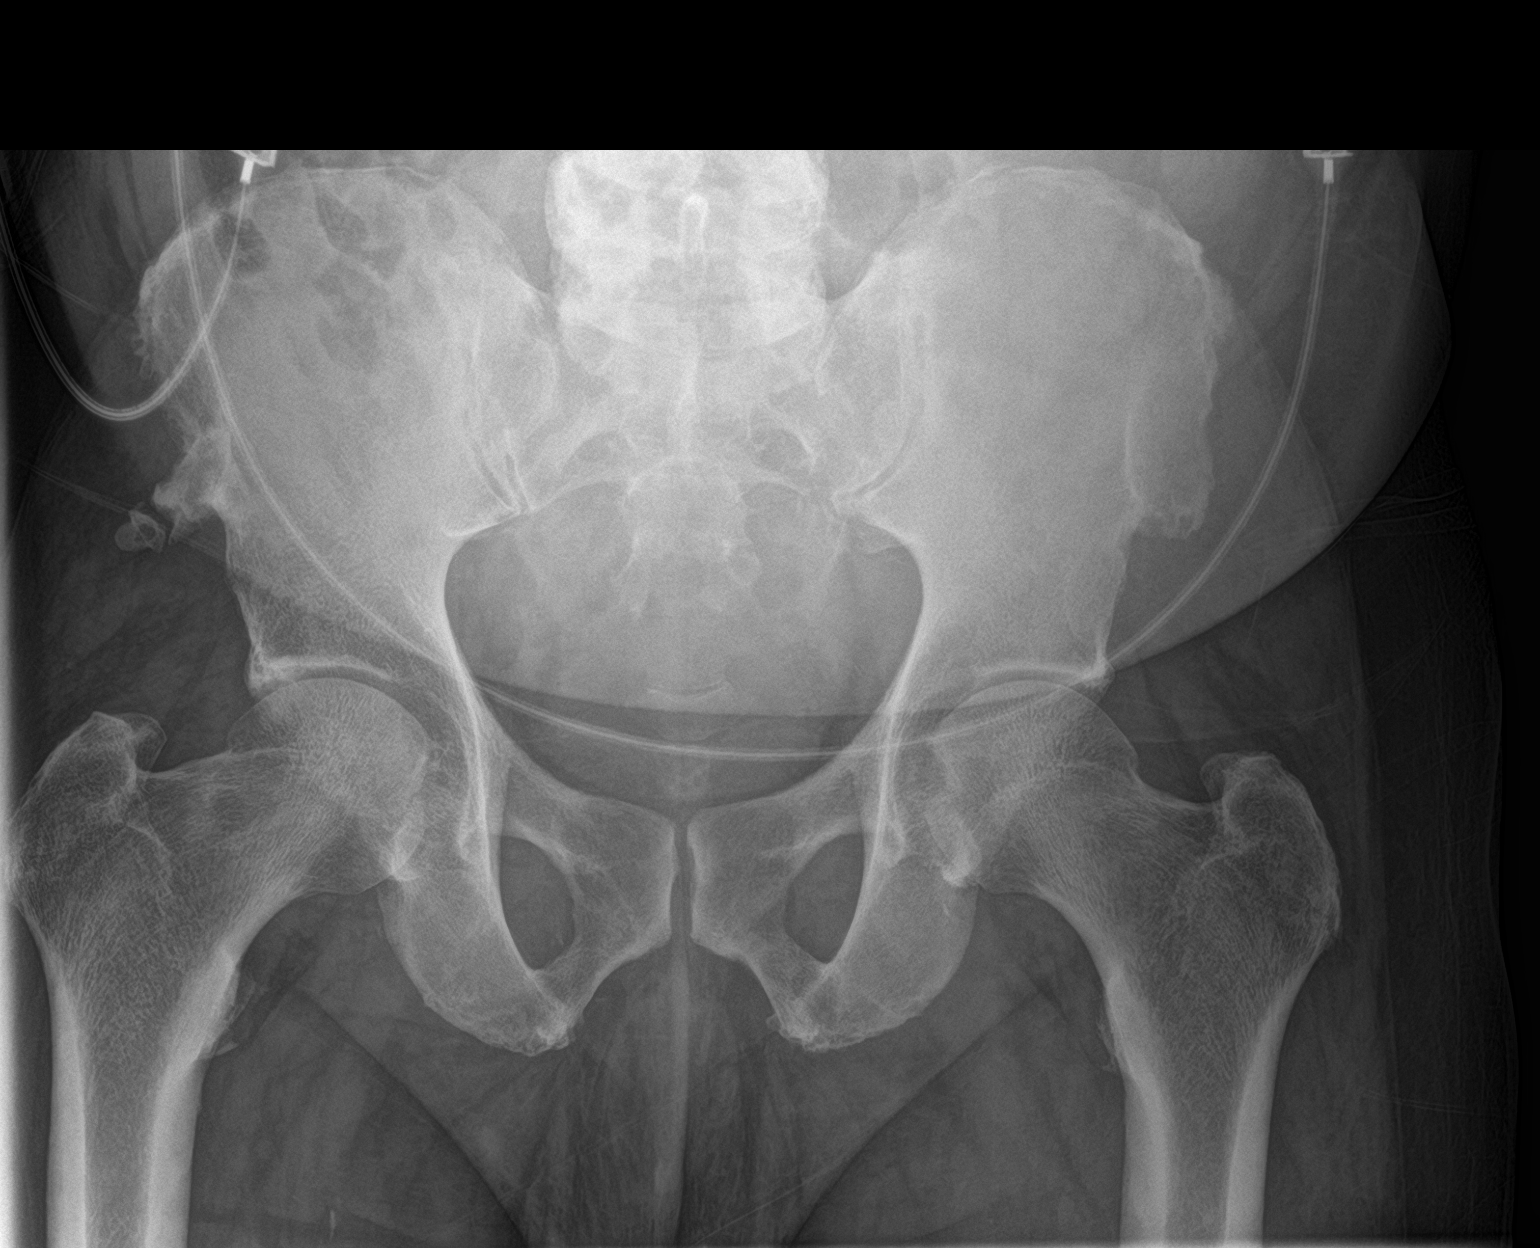

[hip ap]
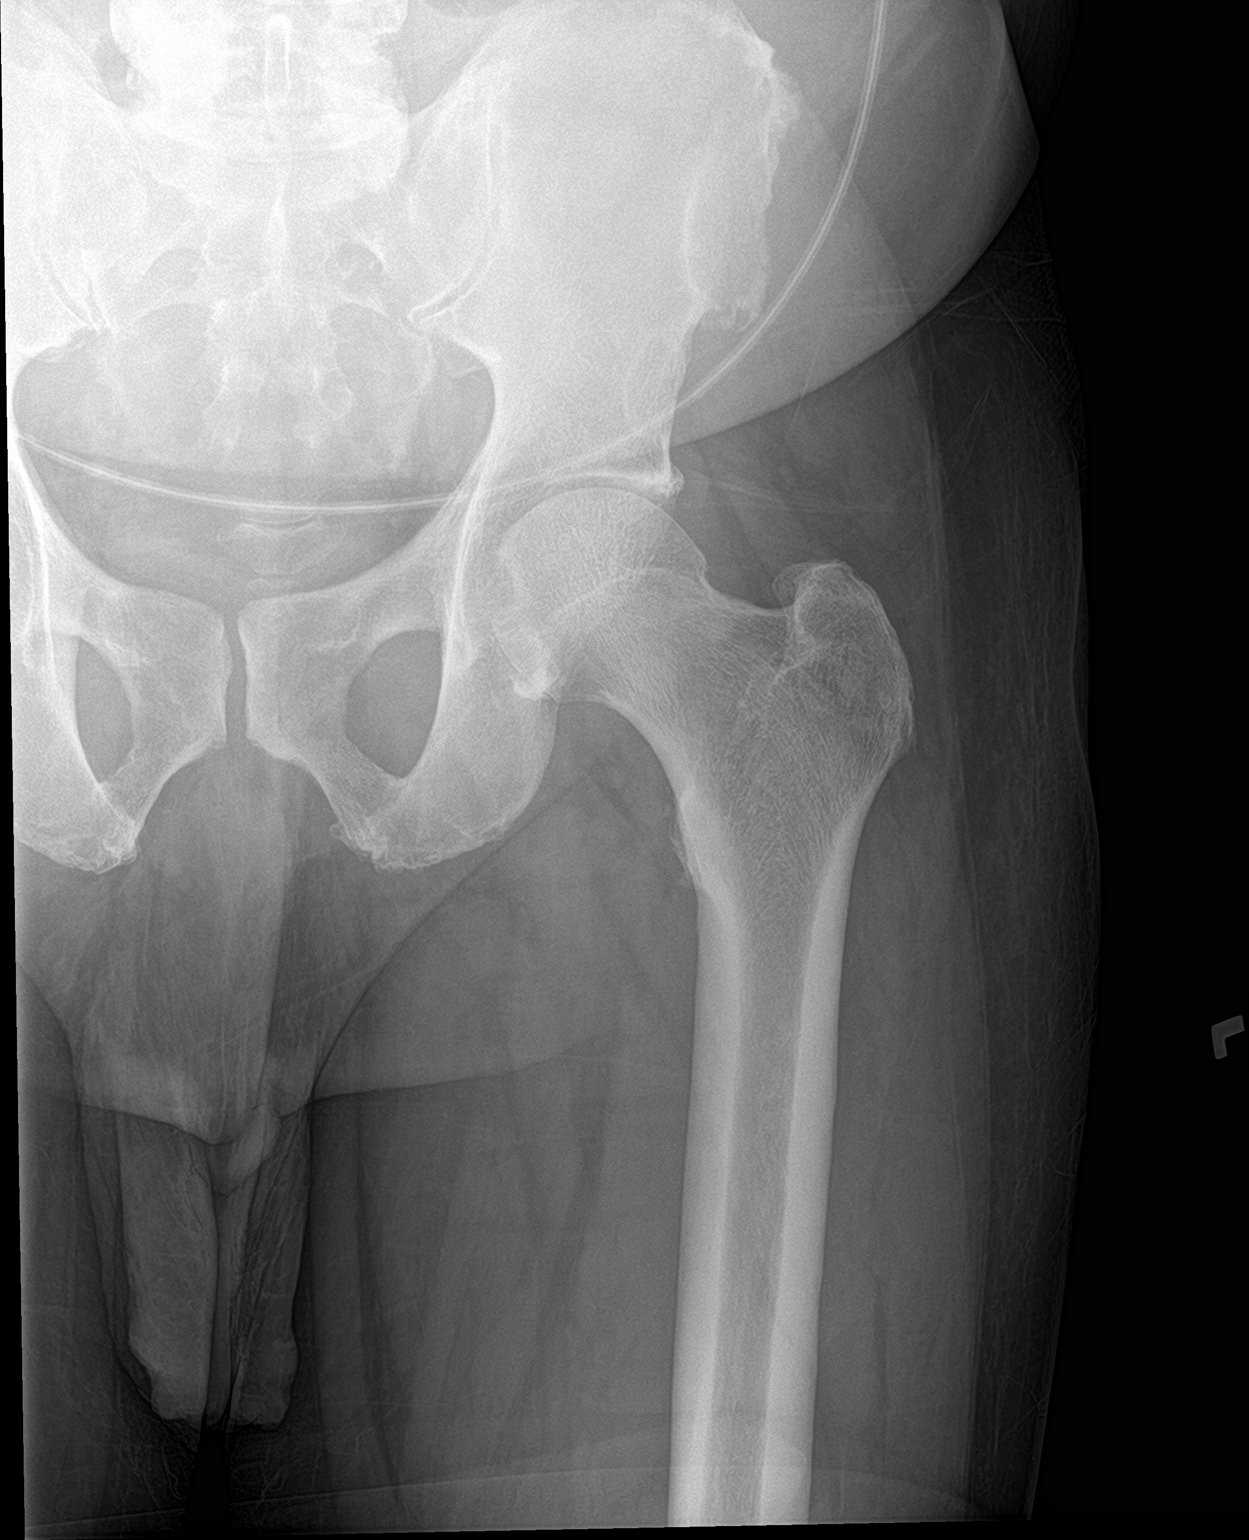

[hip lat]
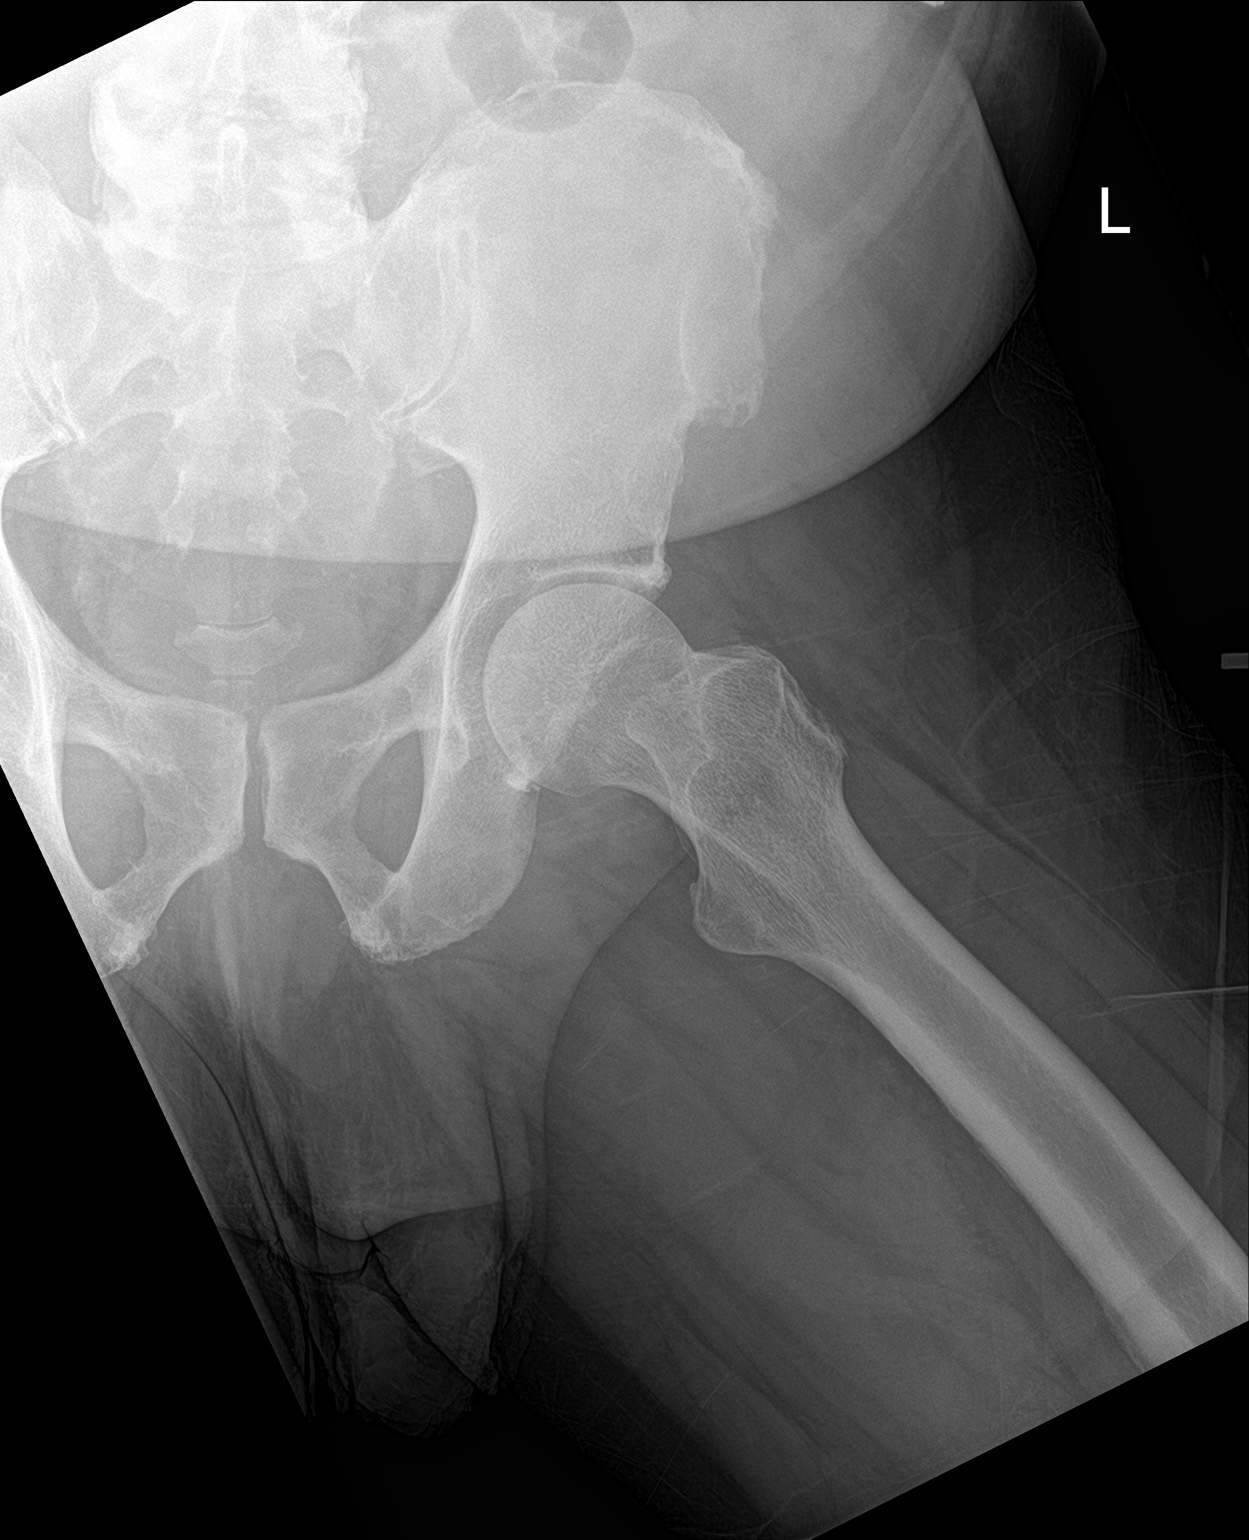

[3 of 3 positions shown; findings below may reference images not displayed]

FINDINGS: Mild symmetric degenerative changes in the hips with joint space
narrowing and spurring. No acute bony abnormality. Specifically, no
fracture, subluxation, or dislocation.
IMPRESSION: No acute bony abnormality.

## 2021-05-06 IMAGING — DX DG SHOULDER 2+V*L*
3 series · 3 of 3 positions shown · non-contrast
Comparison: None.

CLINICAL DATA: Fall

EXAM:
LEFT SHOULDER - 2+ VIEW

[shoulder grashey]
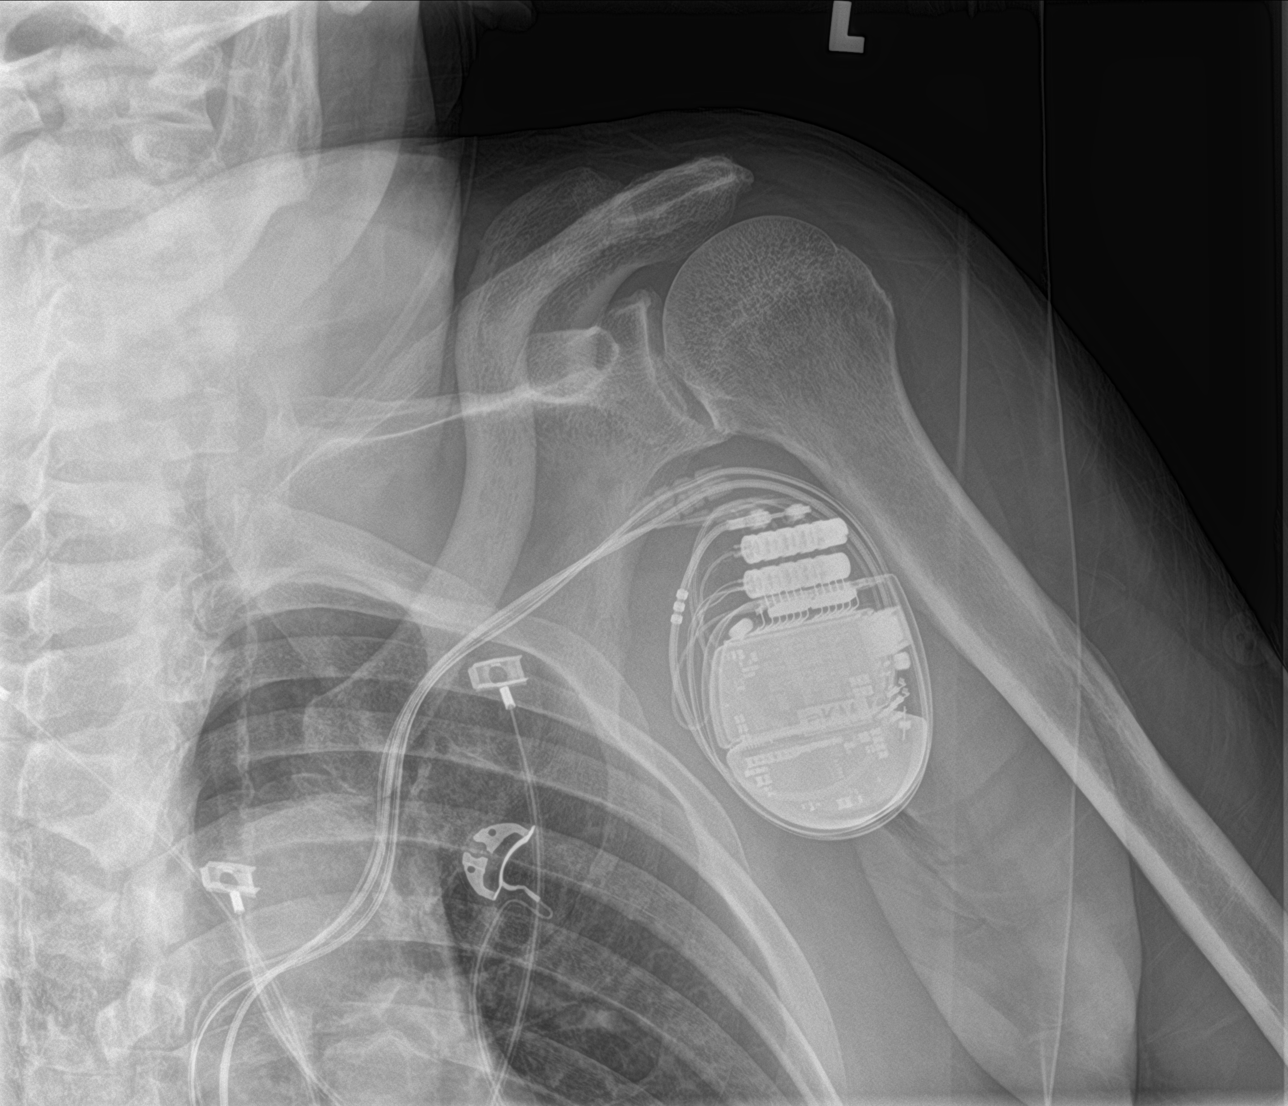

[shoulder y view]
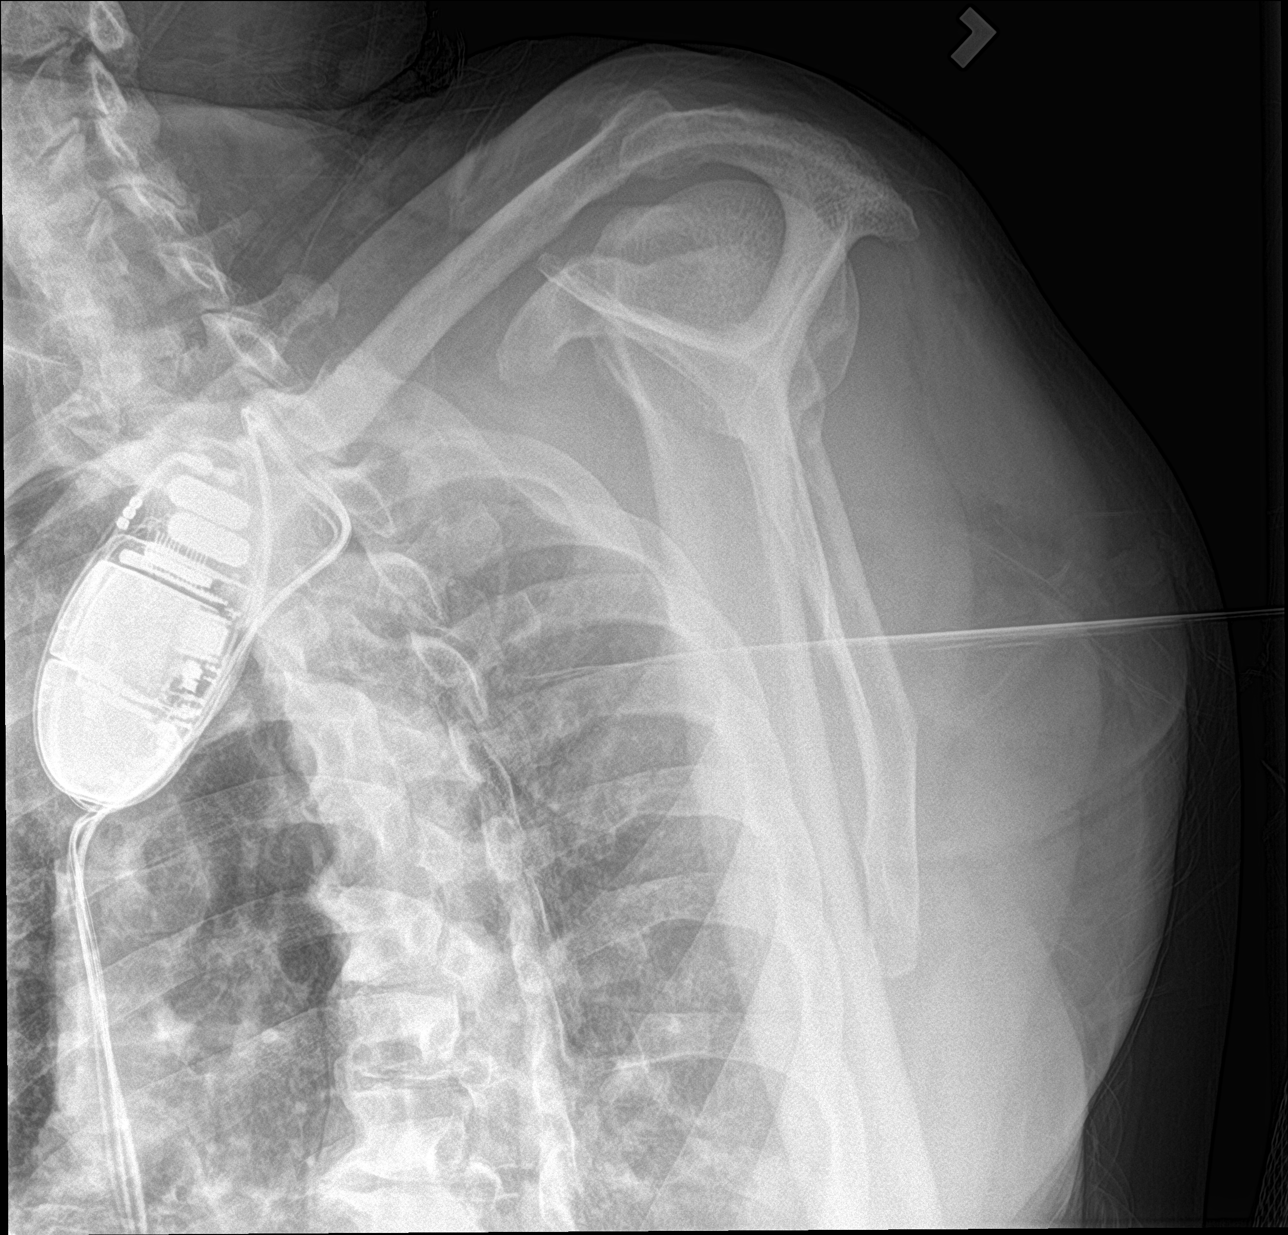

[shoulder ap]
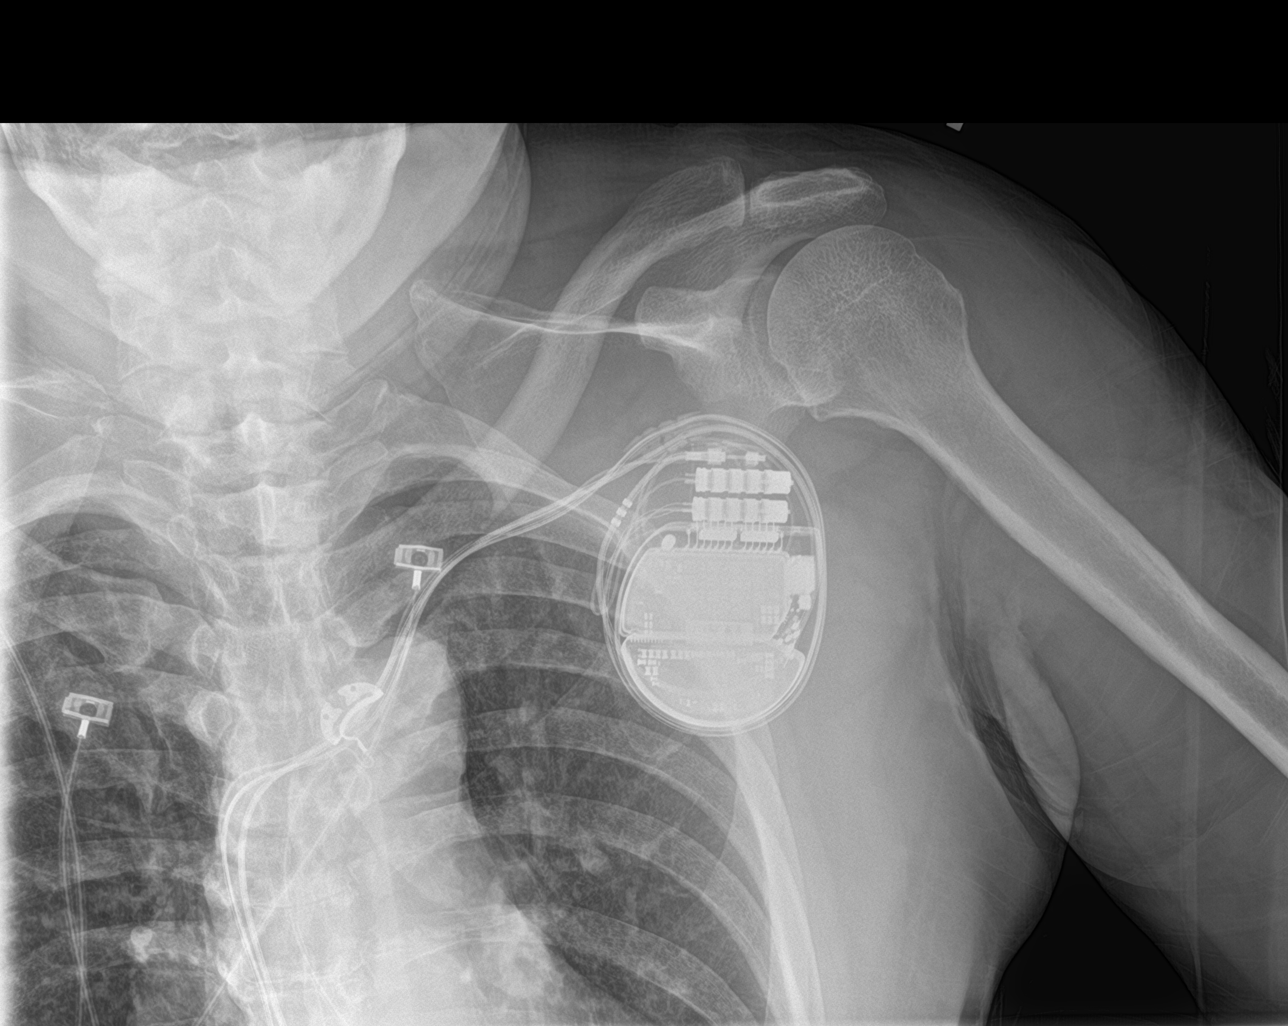

[3 of 3 positions shown; findings below may reference images not displayed]

FINDINGS: Degenerative changes in the AC joint with joint space narrowing and
spurring. Glenohumeral joint is maintained. No acute bony
abnormality. Specifically, no fracture, subluxation, or dislocation.
Soft tissues are intact.
IMPRESSION: Degenerative changes in the left AC joint. No acute bony
abnormality.

## 2021-05-06 MED ORDER — PANTOPRAZOLE SODIUM 40 MG IV SOLR
40.0000 mg | Freq: Once | INTRAVENOUS | Status: AC
  Administered 2021-05-06: 40 mg via INTRAVENOUS
  Filled 2021-05-06: qty 40

## 2021-05-06 MED ORDER — IOHEXOL 350 MG/ML SOLN
80.0000 mL | Freq: Once | INTRAVENOUS | Status: AC | PRN
  Administered 2021-05-06: 80 mL via INTRAVENOUS
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
## 2021-05-06 NOTE — ED Provider Notes (Signed)
  Provider Note MRN:  [id number]  Arrival date & time: 05/07/21    ED Course and Medical Decision Making  Assumed care from Dr. Laverta Baltimore at shift change.  Multiple episodes of hematemesis, anticoagulated, mild comorbidities, here with chest pain, also had a recent fall.  Awaiting CT imaging, anticipating admission.  CT revealing some evidence of lung cancer, admitted to medicine for further care.  Procedures  Final Clinical Impressions(s) / ED Diagnoses     ICD-10-CM   1. Hematemesis, presence of nausea not specified  K92.0       ED Discharge Orders     None       Discharge Instructions   None     Barth Kirks. Sedonia Small, [place] [email or web address]    Maudie Flakes, MD 05/07/21 [phone]

## 2021-05-06 NOTE — ED Triage Notes (Addendum)
Pt. States they have been having chest pain since yesterday. Pt. States they have taken two nitros today. Pt states they have had blood in their emesis. Pt. Has a defibrillator.

## 2021-05-07 ENCOUNTER — Encounter (HOSPITAL_COMMUNITY): Payer: Medicare HMO

## 2021-05-07 DIAGNOSIS — Z86718 Personal history of other venous thrombosis and embolism: Secondary | ICD-10-CM

## 2021-05-07 DIAGNOSIS — I10 Essential (primary) hypertension: Secondary | ICD-10-CM | POA: Diagnosis not present

## 2021-05-07 DIAGNOSIS — Z794 Long term (current) use of insulin: Secondary | ICD-10-CM

## 2021-05-07 DIAGNOSIS — K5900 Constipation, unspecified: Secondary | ICD-10-CM

## 2021-05-07 DIAGNOSIS — K920 Hematemesis: Secondary | ICD-10-CM | POA: Diagnosis not present

## 2021-05-07 DIAGNOSIS — R079 Chest pain, unspecified: Secondary | ICD-10-CM | POA: Diagnosis not present

## 2021-05-07 DIAGNOSIS — I5022 Chronic systolic (congestive) heart failure: Secondary | ICD-10-CM

## 2021-05-07 DIAGNOSIS — J449 Chronic obstructive pulmonary disease, unspecified: Secondary | ICD-10-CM

## 2021-05-07 DIAGNOSIS — R918 Other nonspecific abnormal finding of lung field: Secondary | ICD-10-CM | POA: Diagnosis not present

## 2021-05-07 DIAGNOSIS — J9611 Chronic respiratory failure with hypoxia: Secondary | ICD-10-CM

## 2021-05-07 DIAGNOSIS — R0789 Other chest pain: Secondary | ICD-10-CM

## 2021-05-07 DIAGNOSIS — E785 Hyperlipidemia, unspecified: Secondary | ICD-10-CM

## 2021-05-07 DIAGNOSIS — E1122 Type 2 diabetes mellitus with diabetic chronic kidney disease: Secondary | ICD-10-CM

## 2021-05-07 LAB — GLUCOSE, CAPILLARY
Glucose-Capillary: 72 mg/dL (ref 70–99)
Glucose-Capillary: 69 mg/dL — ABNORMAL LOW (ref 70–99)
Glucose-Capillary: 106 mg/dL — ABNORMAL HIGH (ref 70–99)
Glucose-Capillary: 84 mg/dL (ref 70–99)
Glucose-Capillary: 92 mg/dL (ref 70–99)
Glucose-Capillary: 83 mg/dL (ref 70–99)

## 2021-05-07 LAB — CBC
HCT: 48.1 % (ref 39.0–52.0)
Hemoglobin: 15.4 g/dL (ref 13.0–17.0)
MCH: 30.2 pg (ref 26.0–34.0)
MCHC: 32 g/dL (ref 30.0–36.0)
MCV: 94.3 fL (ref 80.0–100.0)
Platelets: 186 10*3/uL (ref 150–400)
RBC: 5.1 MIL/uL (ref 4.22–5.81)
RDW: 14.6 % (ref 11.5–15.5)
WBC: 7.9 10*3/uL (ref 4.0–10.5)
nRBC: 0 % (ref 0.0–0.2)

## 2021-05-07 LAB — BASIC METABOLIC PANEL
Anion gap: 8 (ref 5–15)
BUN: 15 mg/dL (ref 8–23)
CO2: 25 mmol/L (ref 22–32)
Calcium: 9.4 mg/dL (ref 8.9–10.3)
Chloride: 104 mmol/L (ref 98–111)
Creatinine, Ser: 1.17 mg/dL (ref 0.61–1.24)
GFR, Estimated: >60 mL/min (ref >60)
Glucose, Bld: 105 mg/dL — ABNORMAL HIGH (ref 70–99)
Potassium: 4.6 mmol/L (ref 3.5–5.1)
Sodium: 137 mmol/L (ref 135–145)

## 2021-05-07 LAB — HEMOGLOBIN A1C
Hgb A1c MFr Bld: 7.8 % — ABNORMAL HIGH (ref 4.8–5.6)
Mean Plasma Glucose: 177.16 mg/dL

## 2021-05-07 LAB — RESP PANEL BY RT-PCR (FLU A&B, COVID) ARPGX2
Influenza A by PCR: NEGATIVE
Influenza B by PCR: NEGATIVE
SARS Coronavirus 2 by RT PCR: NEGATIVE

## 2021-05-07 LAB — MRSA NEXT GEN BY PCR, NASAL: MRSA by PCR Next Gen: NOT DETECTED

## 2021-05-07 MED ORDER — ONDANSETRON HCL 4 MG/2ML IJ SOLN: 4.0000 mg | Freq: Four times a day (QID) | INTRAMUSCULAR | Status: DC | PRN

## 2021-05-07 MED ORDER — GABAPENTIN 100 MG PO CAPS
100.0000 mg | ORAL_CAPSULE | Freq: Three times a day (TID) | ORAL | Status: DC
  Administered 2021-05-07 – 2021-05-09 (×8): 100 mg via ORAL
  Filled 2021-05-07 (×8): qty 1

## 2021-05-07 MED ORDER — TIOTROPIUM BROMIDE MONOHYDRATE 2.5 MCG/ACT IN AERS: 2.0000 | INHALATION_SPRAY | Freq: Every day | RESPIRATORY_TRACT | Status: DC

## 2021-05-07 MED ORDER — CARVEDILOL 12.5 MG PO TABS
25.0000 mg | ORAL_TABLET | Freq: Two times a day (BID) | ORAL | Status: DC
  Administered 2021-05-07 – 2021-05-09 (×6): 25 mg via ORAL
  Filled 2021-05-07 (×7): qty 2

## 2021-05-07 MED ORDER — UMECLIDINIUM BROMIDE 62.5 MCG/INH IN AEPB
1.0000 | INHALATION_SPRAY | Freq: Every day | RESPIRATORY_TRACT | Status: DC
  Administered 2021-05-07 – 2021-05-09 (×3): 1 via RESPIRATORY_TRACT
  Filled 2021-05-07: qty 7

## 2021-05-07 MED ORDER — BUDESONIDE 0.5 MG/2ML IN SUSP
0.5000 mg | Freq: Every day | RESPIRATORY_TRACT | Status: DC
  Administered 2021-05-07 – 2021-05-09 (×3): 0.5 mg via RESPIRATORY_TRACT
  Filled 2021-05-07 (×4): qty 2

## 2021-05-07 MED ORDER — INSULIN DETEMIR 100 UNIT/ML ~~LOC~~ SOLN
20.0000 [IU] | Freq: Every day | SUBCUTANEOUS | Status: DC
  Administered 2021-05-07: 20 [IU] via SUBCUTANEOUS
  Filled 2021-05-07 (×3): qty 0.2

## 2021-05-07 MED ORDER — AMITRIPTYLINE HCL 25 MG PO TABS
50.0000 mg | ORAL_TABLET | Freq: Two times a day (BID) | ORAL | Status: DC
  Administered 2021-05-07 – 2021-05-09 (×6): 50 mg via ORAL
  Filled 2021-05-07 (×7): qty 2

## 2021-05-07 MED ORDER — ATORVASTATIN CALCIUM 40 MG PO TABS
40.0000 mg | ORAL_TABLET | Freq: Every day | ORAL | Status: DC
  Administered 2021-05-07 – 2021-05-09 (×3): 40 mg via ORAL
  Filled 2021-05-07 (×3): qty 1

## 2021-05-07 MED ORDER — SPIRONOLACTONE 25 MG PO TABS
25.0000 mg | ORAL_TABLET | Freq: Every day | ORAL | Status: DC
  Administered 2021-05-07 – 2021-05-09 (×3): 25 mg via ORAL
  Filled 2021-05-07 (×3): qty 1

## 2021-05-07 MED ORDER — MORPHINE SULFATE (PF) 2 MG/ML IV SOLN: 1.0000 mg | INTRAVENOUS | Status: DC | PRN

## 2021-05-07 MED ORDER — ONDANSETRON HCL 4 MG PO TABS: 4.0000 mg | ORAL_TABLET | Freq: Four times a day (QID) | ORAL | Status: DC | PRN

## 2021-05-07 MED ORDER — INSULIN ASPART 100 UNIT/ML IJ SOLN: 0.0000 [IU] | Freq: Every day | INTRAMUSCULAR | Status: DC

## 2021-05-07 MED ORDER — ISOSORBIDE MONONITRATE ER 60 MG PO TB24: 30.0000 mg | ORAL_TABLET | Freq: Every day | ORAL | Status: DC

## 2021-05-07 MED ORDER — DEXTROSE 50 % IV SOLN
12.5000 g | INTRAVENOUS | Status: AC
  Administered 2021-05-07: 12.5 g via INTRAVENOUS

## 2021-05-07 MED ORDER — ALBUTEROL SULFATE HFA 108 (90 BASE) MCG/ACT IN AERS
1.0000 | INHALATION_SPRAY | Freq: Four times a day (QID) | RESPIRATORY_TRACT | Status: DC | PRN
Start: 2021-05-07 — End: 2021-05-09

## 2021-05-07 MED ORDER — PANTOPRAZOLE SODIUM 40 MG IV SOLR
40.0000 mg | Freq: Two times a day (BID) | INTRAVENOUS | Status: DC
  Administered 2021-05-07 – 2021-05-09 (×5): 40 mg via INTRAVENOUS
  Filled 2021-05-07 (×5): qty 40

## 2021-05-07 MED ORDER — DEXTROSE 50 % IV SOLN
INTRAVENOUS | Status: AC
  Filled 2021-05-07: qty 50

## 2021-05-07 MED ORDER — HYDROCODONE-ACETAMINOPHEN 10-325 MG PO TABS
1.0000 | ORAL_TABLET | Freq: Four times a day (QID) | ORAL | Status: DC | PRN
Start: 2021-05-07 — End: 2021-05-09
  Administered 2021-05-07 – 2021-05-09 (×5): 1 via ORAL
  Filled 2021-05-07 (×5): qty 1

## 2021-05-07 MED ORDER — ACETAMINOPHEN 650 MG RE SUPP: 650.0000 mg | Freq: Four times a day (QID) | RECTAL | Status: DC | PRN

## 2021-05-07 MED ORDER — HYDRALAZINE HCL 25 MG PO TABS
50.0000 mg | ORAL_TABLET | Freq: Three times a day (TID) | ORAL | Status: DC
  Administered 2021-05-07 – 2021-05-08 (×3): 50 mg via ORAL
  Filled 2021-05-07 (×4): qty 2

## 2021-05-07 MED ORDER — MORPHINE SULFATE (PF) 2 MG/ML IV SOLN
2.0000 mg | INTRAVENOUS | Status: DC | PRN
Start: 2021-05-07 — End: 2021-05-07

## 2021-05-07 MED ORDER — ACETAMINOPHEN 325 MG PO TABS: 650.0000 mg | ORAL_TABLET | Freq: Four times a day (QID) | ORAL | Status: DC | PRN

## 2021-05-07 MED ORDER — POLYETHYLENE GLYCOL 3350 17 G PO PACK
17.0000 g | PACK | Freq: Every day | ORAL | Status: DC
  Administered 2021-05-07: 17 g via ORAL
  Filled 2021-05-07 (×2): qty 1

## 2021-05-07 MED ORDER — METHOCARBAMOL 500 MG PO TABS: 500.0000 mg | ORAL_TABLET | Freq: Three times a day (TID) | ORAL | Status: DC | PRN

## 2021-05-07 MED ORDER — INSULIN ASPART 100 UNIT/ML IJ SOLN
0.0000 [IU] | Freq: Three times a day (TID) | INTRAMUSCULAR | Status: DC
  Administered 2021-05-09: 3 [IU] via SUBCUTANEOUS

## 2021-05-07 MED ORDER — INSULIN DETEMIR 100 UNIT/ML ~~LOC~~ SOLN
35.0000 [IU] | Freq: Every day | SUBCUTANEOUS | Status: DC
  Administered 2021-05-07: 35 [IU] via SUBCUTANEOUS
  Filled 2021-05-07 (×2): qty 0.35

## 2021-05-07 NOTE — Consult Note (Addendum)
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 nausea/vomiting since that time. No similar symptoms in the past. Chronically on omeprazole daily which controls GERD well. No other significant GI symptoms. Denies abdominal pain, brbpr, or melena. Denies NSAIDs. Chronically on Xarelto with last dose morning of 9/5.  Intermittently on prednisone, last course about 2 months ago.  No prior EGD. Hg on admission 15.7 and stable today at 15.4. CT A/P with contrast with no acute findings. 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  Notably, patient denies hemoptysis.  Nausea/vomiting may have been related to acute gastroenteritis as symptoms resolved within 24 hours.  Hematemesis may be  secondary to Mallory-Weiss tear vs gastritis, PUD, AVMs.  Although his hemoglobin has remained stable, would recommend EGD this admission as he requires chronic anticoagulation.   Chest Pain: Resolved with nitroglycerine prior to admission. EKG without acute  findings. Troponin flat. Doubt this would hinder EGD. Cardiology on board.   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 Will start MiraLAX 17 g daily. May need prescriptive agent in the future.   Colon Cancer Screening: Needs complete colonoscopy.  Last colonoscopy in 2014 in [place], incomplete.  This can be completed on outpatient basis.  Plan: Clear liquid diet today. Continue to hold Xarelto. Last dose morning of 9/5. Likely EGD with propofol with Dr. Laural Golden tomorrow. The risks, benefits, and alternatives have been discussed with the patient in detail. The patient states understanding and desires to proceed.  Will discuss with Dr. Jenetta Downer. Continue IV PPI twice daily. Continue to monitor H/H daily and for overt GI bleeding. Start MiraLAX 17g daily. Needs complete colonoscopy on outpatient basis.   LOS: 0 days    05/07/2021, 7:47 AM   Aliene Altes, PA-C [facility]

## 2021-05-07 NOTE — Progress Notes (Signed)
ASSUMPTION OF CARE NOTE   05/07/2021 4:25 PM  Roger Lowe. was seen and examined.  The H&P by the admitting provider, orders, imaging was reviewed.  Please see new orders.  Will continue to follow.  Requested GI, cardio, oncology evaluations.    Impression / Plan   Chest pain - associated with hematemesis, resolved now.  Checked another HS troponin which has been reassuring.  Appreciate cardiology recommendations.  Follow clinical course.   Hematemesis - clears diet started, T&S, GI consult appreciated, planning for EGD on 9/7.    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   Chronic diastolic heart failure - resumed home carvedilol and spironolactone.   Type 2 DM - follow CBGs and continue SSI coverage.   Hyperlipidemia - resumed home statin therapy.   History of DVT/PE / acquired thrombophilia - resume home xarelto in setting of hematemesis.     Vitals:   05/07/21 0840 05/07/21 1010  BP:  (!) 153/87  Pulse:  80  Resp:    Temp:    SpO2: 100%     Results for orders placed or performed during the hospital encounter of 05/06/21  Resp Panel by RT-PCR (Flu A&B, Covid) Nasopharyngeal Swab   Specimen: Nasopharyngeal Swab; Nasopharyngeal(NP) swabs in vial transport medium  Result Value Ref Range   SARS Coronavirus 2 by RT PCR NEGATIVE NEGATIVE   Influenza A by PCR NEGATIVE NEGATIVE   Influenza B by PCR NEGATIVE NEGATIVE  Basic metabolic panel  Result Value Ref Range   Sodium 137 135 - 145 mmol/L   Potassium 3.8 3.5 - 5.1 mmol/L   Chloride 103 98 - 111 mmol/L   CO2 28 22 - 32 mmol/L   Glucose, Bld 110 (H) 70 - 99 mg/dL   BUN 15 8 - 23 mg/dL   Creatinine, Ser 1.33 (H) 0.61 - 1.24 mg/dL   Calcium 9.1 8.9 - 10.3 mg/dL   GFR, Estimated 56 (L) >60 mL/min   Anion gap 6 5 - 15  CBC  Result Value Ref Range   WBC 7.5 4.0 - 10.5 K/uL   RBC 5.23 4.22 - 5.81 MIL/uL   Hemoglobin 15.7 13.0 - 17.0 g/dL   HCT 49.7  39.0 - 52.0 %   MCV 95.0 80.0 - 100.0 fL   MCH 30.0 26.0 - 34.0 pg   MCHC 31.6 30.0 - 36.0 g/dL   RDW 14.6 11.5 - 15.5 %   Platelets 177 150 - 400 K/uL   nRBC 0.0 0.0 - 0.2 %  Hepatic function panel  Result Value Ref Range   Total Protein 7.1 6.5 - 8.1 g/dL   Albumin 3.6 3.5 - 5.0 g/dL   AST 20 15 - 41 U/L   ALT 20 0 - 44 U/L   Alkaline Phosphatase 82 38 - 126 U/L   Total Bilirubin 0.6 0.3 - 1.2 mg/dL   Bilirubin, Direct 0.2 0.0 - 0.2 mg/dL   Indirect Bilirubin 0.4 0.3 - 0.9 mg/dL  Lipase, blood  Result Value Ref Range   Lipase 21 11 - 51 U/L  CBC  Result Value Ref Range   WBC 7.9 4.0 - 10.5 K/uL   RBC 5.10 4.22 - 5.81 MIL/uL   Hemoglobin 15.4 13.0 - 17.0 g/dL   HCT 48.1 39.0 - 52.0 %   MCV 94.3 80.0 - 100.0 fL   MCH 30.2 26.0 - 34.0 pg   MCHC 32.0 30.0 - 36.0 g/dL   RDW  14.6 11.5 - 15.5 %   Platelets 186 150 - 400 K/uL   nRBC 0.0 0.0 - 0.2 %  Basic metabolic panel  Result Value Ref Range   Sodium 137 135 - 145 mmol/L   Potassium 4.6 3.5 - 5.1 mmol/L   Chloride 104 98 - 111 mmol/L   CO2 25 22 - 32 mmol/L   Glucose, Bld 105 (H) 70 - 99 mg/dL   BUN 15 8 - 23 mg/dL   Creatinine, Ser 1.17 0.61 - 1.24 mg/dL   Calcium 9.4 8.9 - 10.3 mg/dL   GFR, Estimated >60 >60 mL/min   Anion gap 8 5 - 15  Hemoglobin A1c  Result Value Ref Range   Hgb A1c MFr Bld 7.8 (H) 4.8 - 5.6 %   Mean Plasma Glucose 177.16 mg/dL  Glucose, capillary  Result Value Ref Range   Glucose-Capillary 72 70 - 99 mg/dL  Glucose, capillary  Result Value Ref Range   Glucose-Capillary 69 (L) 70 - 99 mg/dL  Glucose, capillary  Result Value Ref Range   Glucose-Capillary 106 (H) 70 - 99 mg/dL  Glucose, capillary  Result Value Ref Range   Glucose-Capillary 84 70 - 99 mg/dL  Troponin I (High Sensitivity)  Result Value Ref Range   Troponin I (High Sensitivity) 11 <18 ng/L  Troponin I (High Sensitivity)  Result Value Ref Range   Troponin I (High Sensitivity) 11 <18 ng/L   C. Wynetta Emery, MD [facility]   05/06/2021  7:36 PM How to contact the [facility] Attending or Consulting provider [place] or covering provider during after hours [place], for this patient?  Check the care team in [facility] and look for a) attending/consulting [facility] provider listed and b) the [facility] team listed Log into [email or web address] and use [place]'s universal password to access. If you do not have the password, please contact the hospital operator. Locate the [facility] provider you are looking for under [facility] and page to a number that you can be directly reached. If you still have difficulty reaching the provider, please page the [facility] (Director on Call) for the Hospitalists listed on amion for assistance.

## 2021-05-07 NOTE — Progress Notes (Signed)
Hypoglycemic Event  CBG: 69  Treatment: D50 25 mL (12.5 gm)  Symptoms: Sweaty  Follow-up CBG: ONGE:[id number] CBG Result:106  Possible Reasons for Event: Pt was NPO  Comments/MD notified: Irwin Brakeman MD    Clovis Fredrickson

## 2021-05-07 NOTE — H&P (Signed)
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 cysts are seen within the left kidney. The kidneys are otherwise unremarkable. The bladder is unremarkable. Stomach/Bowel: Stomach is within normal limits. Appendix appears normal. No evidence of bowel wall thickening, distention, or inflammatory changes. No free intraperitoneal gas or fluid. Vascular/Lymphatic: Moderate aortoiliac atherosclerotic calcification. No aortic aneurysm. No pathologic adenopathy within the abdomen and pelvis. Reproductive: Marked prostatic enlargement noted. Other: Tiny  broad-based fat containing umbilical hernia. Rectum is unremarkable. Musculoskeletal: No acute bone abnormality. No lytic or blastic bone lesion. Degenerative changes are seen within the lumbar spine. IMPRESSION: Mean 14 mm cavitary nodule within the a left lower lobe demonstrating progressive enlargement since remote prior examinations suspicious for a primary bronchogenic neoplasm. Consider one of the following in 3 months for both low-risk and high-risk individuals: (a) repeat chest CT, (b) follow-up PET-CT, or (c) tissue sampling. This recommendation follows the consensus statement: Guidelines for Management of Incidental Pulmonary Nodules Detected on CT Images: From the Fleischner Society 2017; Radiology 2017; 284:228-243. Mild emphysema. Bibasilar bronchiectasis and stable peribronchial nodularity, likely post inflammatory in nature. 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 Recommend annual imaging followup by CTA or MRA. This recommendation follows 2010 ACCF/AHA/AATS/ACR/ASA/SCA/SCAI/SIR/STS/SVM Guidelines for the Diagnosis and Management of Patients with Thoracic Aortic Disease. Circulation.2010; 121: J696-V893. Aortic aneurysm NOS (ICD10-I71.9) No acute intra-abdominal pathology. Aortic Atherosclerosis (ICD10-I70.0) and Emphysema (ICD10-J43.9). Electronically Signed   By: Linwood Dibbles.D.  On: 05/06/2021 23:45   CT ABDOMEN PELVIS W CONTRAST  Result Date: 05/06/2021 CLINICAL DATA:  Chest pain, hematemesis, gastrointestinal hemorrhage EXAM: CT CHEST, ABDOMEN, AND PELVIS WITH CONTRAST TECHNIQUE: Multidetector CT imaging of the chest, abdomen and pelvis was performed following the standard protocol during bolus administration of intravenous contrast. CONTRAST:  30mL OMNIPAQUE IOHEXOL 350 MG/ML SOLN COMPARISON:  CT chest 08/28/2020, 10/11/2019 FINDINGS: CT CHEST FINDINGS Cardiovascular: No significant coronary artery calcification. Global cardiac size within normal limits. 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 No pericardial effusion. The central pulmonary arteries are of normal caliber. There is mild dilation of the descending thoracic aorta, measuring 3.1 cm in diameter at the level of the aortic isthmus. Distally, the descending thoracic aorta measures 2.8 cm in diameter at the level of the left atrium. The ascending aorta is not dilated. Mediastinum/Nodes: A single enlarged aortopulmonary window lymph node is seen measuring 14 mm in short axis diameter, axial image # 22/2, nonspecific. This appears enlarged since prior examination. No additional pathologic thoracic adenopathy. Visualized thyroid is unremarkable. Esophagus is unremarkable. Lungs/Pleura: Mild centrilobular emphysema. There is extensive bibasilar cylindrical bronchiectasis, more severe within the left lower lobe with associated consolidation and volume loss. There is superimposed peribronchial nodularity within the left lower lobe and basilar lingula which is stable from prior examination and likely represents post inflammatory scarring. A enlarging cavitary nodule is seen within the left lower lobe measuring 13 mm x 15 mm at axial image # 83/7, previously measuring 8 mm x 12 mm and new from remote prior examination of 05/23/2019. An enlarging primary bronchogenic neoplasm is favored. No pneumothorax or pleural effusion.  No central obstructing lesion. Musculoskeletal: No acute bone abnormality. No lytic or blastic bone lesion. CT ABDOMEN PELVIS FINDINGS Hepatobiliary: No focal liver abnormality is seen. No gallstones, gallbladder wall thickening, or biliary dilatation. Pancreas: Unremarkable Spleen: Unremarkable Adrenals/Urinary Tract: The adrenal glands are unremarkable. The kidneys are normal in size and position. Multiple simple cortical cysts are seen within the left kidney. The kidneys are otherwise  unremarkable. The bladder is unremarkable. Stomach/Bowel: Stomach is within normal limits. Appendix appears normal. No evidence of bowel wall thickening, distention, or inflammatory changes. No free intraperitoneal gas or fluid. Vascular/Lymphatic: Moderate aortoiliac atherosclerotic calcification. No aortic aneurysm. No pathologic adenopathy within the abdomen and pelvis. Reproductive: Marked prostatic enlargement noted. Other: Tiny broad-based fat containing umbilical hernia. Rectum is unremarkable. Musculoskeletal: No acute bone abnormality. No lytic or blastic bone lesion. Degenerative changes are seen within the lumbar spine. IMPRESSION: Mean 14 mm cavitary nodule within the a left lower lobe demonstrating progressive enlargement since remote prior examinations suspicious for a primary bronchogenic neoplasm. Consider one of the following in 3 months for both low-risk and high-risk individuals: (a) repeat chest CT, (b) follow-up PET-CT, or (c) tissue sampling. This recommendation follows the consensus statement: Guidelines for Management of Incidental Pulmonary Nodules Detected on CT Images: From the Fleischner Society 2017; Radiology 2017; 284:228-243. Mild emphysema. Bibasilar bronchiectasis and stable peribronchial nodularity, likely post inflammatory in nature. 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 Recommend annual imaging followup by CTA or MRA. This recommendation follows 2010 ACCF/AHA/AATS/ACR/ASA/SCA/SCAI/SIR/STS/SVM Guidelines for the Diagnosis and Management of Patients with Thoracic Aortic Disease. Circulation.2010; 121: P619-J093. Aortic aneurysm NOS (ICD10-I71.9) No acute intra-abdominal pathology. Aortic Atherosclerosis (ICD10-I70.0) and Emphysema (ICD10-J43.9). Electronically Signed   By: Fidela Salisbury M.D.   On: 05/06/2021 23:45  DG Shoulder Left  Result Date: 05/06/2021 CLINICAL DATA:  Fall EXAM: LEFT SHOULDER - 2+ VIEW COMPARISON:  None. FINDINGS: Degenerative changes  in the [facility] joint with joint space narrowing and spurring. Glenohumeral joint is maintained. No acute bony abnormality. Specifically, no fracture, subluxation, or dislocation. Soft tissues are intact. 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 No acute bony abnormality. Electronically Signed   By: Rolm Baptise M.D.   On: 05/06/2021 22:31   DG Hip Unilat W or Wo Pelvis 2-3 Views Left  Result Date: 05/06/2021 CLINICAL DATA:  Fall, left hip pain EXAM: DG HIP (WITH OR WITHOUT PELVIS) 2-3V LEFT COMPARISON:  None FINDINGS: Mild symmetric degenerative changes in the hips with joint space narrowing and spurring. No acute bony abnormality. Specifically, no fracture, subluxation, or dislocation. IMPRESSION: No acute bony abnormality. Electronically Signed   By: Rolm Baptise M.D.   On: 05/06/2021 22:29    My personal review of EKG: Rhythm sinus tachycardia, Rate 103/min, QTc 430 ,no Acute ST changes   Assessment & Plan:    Active Problems:   Chest pain   Chronic hypoxemic respiratory failure (HCC)   Hyperlipidemia   Congestive heart failure (HCC)   Abnormal CT scan of lung   Essential hypertension   Type 2 diabetes mellitus (HCC)   Hematemesis   Hematemesis Hemoglobin is stable at 15.4 Reported 4 episodes of hematemesis at home Continue Protonix Consult GI possible EGD Hold Xarelto N.p.o. Continue to monitor Chest pain Relieved with nitro Troponin stable at 11, 11 EKG stable No recurrence of chest pain thus far obs on telemetry Abnormal CT scan of the lung Known history of lung cancer, known to have multiple nodules, follows with pulm New 14 mm cavitary nodule within the left lower lobe demonstrating progressive enlargement that is likely bronchogenic neoplasm Consult oncology Continue to monitor Chronic respiratory failure with hypoxia Continue oxygen supplementation with baseline 3 L nasal cannula Continue to monitor Congestive heart failure Continue spironolactone, Coreg,  statin-patient reports that he is no longer taking Imdur Not currently in exacerbation Diabetes mellitus type 2 Last hemoglobin A1c 7.1, update hemoglobin A1c Continue basal and correction scale insulin Currently n.p.o. Continue gabapentin for neuropathy Monitor CBGs Essential hypertension Continue hydralazine Hyperlipidemia Continue statin History of PE/DVT Holding Xarelto in the setting of possible bleed   DVT Prophylaxis-    SCDs   AM Labs Ordered, also please review Full Orders  Family Communication: No family at bedside Code Status: DNR  Admission status: Observation Time spent in minutes : [place] DO

## 2021-05-07 NOTE — H&P (View-Only) (Signed)
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 that time. No similar symptoms in the past. Chronically on omeprazole daily which controls GERD well. No other significant GI symptoms. Denies abdominal pain, brbpr, or melena. Denies NSAIDs. Chronically on Xarelto with last dose morning of 9/5.  Intermittently on prednisone, last course about 2 months ago.  No prior EGD. Hg on admission 15.7 and stable today at 15.4. CT A/P with contrast with no acute findings. 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  Notably, patient denies hemoptysis.  Nausea/vomiting may have been related to acute gastroenteritis as symptoms resolved within 24 hours.  Hematemesis may be  secondary to Mallory-Weiss tear vs gastritis, PUD, AVMs.  Although his hemoglobin has remained stable, would recommend EGD this admission as he requires chronic anticoagulation.   Chest Pain: Resolved with nitroglycerine prior to admission. EKG without acute  findings. Troponin flat. Doubt this would hinder EGD. Cardiology on board.   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 Will start MiraLAX 17 g daily. May need prescriptive agent in the future.   Colon Cancer Screening: Needs complete colonoscopy.  Last colonoscopy in 2014 in [place], incomplete.  This can be completed on outpatient basis.  Plan: Clear liquid diet today. Continue to hold Xarelto. Last dose morning of 9/5. Likely EGD with propofol with Dr. Laural Golden tomorrow. The risks, benefits, and alternatives have been discussed with the patient in detail. The patient states understanding and desires to proceed.  Will discuss with Dr. Jenetta Downer. Continue IV PPI twice daily. Continue to monitor H/H daily and for overt GI bleeding. Start MiraLAX 17g daily. Needs complete colonoscopy on outpatient basis.   LOS: 0 days    05/07/2021, 7:47 AM   Aliene Altes, PA-C [facility]

## 2021-05-07 NOTE — Consult Note (Addendum)
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 joint is maintained. No acute bony abnormality. Specifically, no fracture, subluxation, or dislocation. Soft tissues are intact. 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 No acute bony abnormality. Electronically Signed   By: Rolm Baptise M.D.   On: 05/06/2021 22:31   DG Hip Unilat W or Wo Pelvis 2-3 Views Left  Result Date: 05/06/2021 CLINICAL DATA:  Fall, left hip pain EXAM: DG HIP (WITH OR WITHOUT PELVIS) 2-3V LEFT COMPARISON:  None FINDINGS: Mild symmetric degenerative changes in the hips with joint space narrowing and spurring. No acute bony abnormality. Specifically, no  fracture, subluxation, or dislocation. IMPRESSION: No acute bony abnormality. Electronically Signed   By: Rolm Baptise M.D.   On: 05/06/2021 22:29     Assessment and Plan:   Chest pain in the setting of hematemesis relieved with NTG. Troponins negative and EKG  unchanged. With history of nonobstructive CAD on cath 2020 would not do further cardiac workup at this time. Needs GI/pulmonary.  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  COPD on O2, with left sided lung cancer s/p resection-CT 14 mm cavitary nodule LLL likely bronchogenic neoplasm  HTN-BP up on admission but now normal     Risk Assessment/Risk Scores:     HEAR Score (for undifferentiated chest pain):  HEAR Score: 6  New York Heart Association (NYHA) Functional Class NYHA Class II        For questions or updates, please contact [facility] Please consult [email or web address] for contact info under    Signed, Ermalinda Barrios, PA-C  05/07/2021 9:11 AM  Pt seen and examined   I agree with findings as noted above by Gerrianne Scale Pt is a 75 yo with known nonobstructive CAD by cath in 2016, CHF (s/p BIV ICD with normalization of LVEF), COPD, HTN Admitted with chest pain ain hemetemesis  Pt describes pain as pressure that was pleuritic   lasted a few hours on and off   On admit patient hypertensive   Pt currently pt pain free   BP is still high at times  Neck:  JVP is normal Lungs are rel clear Cardiac exam:   RRR  No S3  No murmurs     Abd is supple   Ext are without edema  Trop negative EKG without acute changes  CT scan as noted with mass  CP  Hx of nonobstructive CAD   I am not convinced  current CP is cardiac in origin  Pleuritic   Conge   Look for other causes  Pulmonary   BP is improving   Keep track today before adjusting meds    Pt s/p  BiV ICD  Volume status looks good   Follow    Dorris Carnes MD

## 2021-05-08 ENCOUNTER — Ambulatory Visit (INDEPENDENT_AMBULATORY_CARE_PROVIDER_SITE_OTHER): Payer: Medicare HMO

## 2021-05-08 ENCOUNTER — Encounter (HOSPITAL_COMMUNITY): Payer: Self-pay | Admitting: Family Medicine

## 2021-05-08 ENCOUNTER — Other Ambulatory Visit: Payer: Self-pay

## 2021-05-08 ENCOUNTER — Encounter (HOSPITAL_COMMUNITY)
Admission: EM | Disposition: A | Discharge status: Home / Self Care | Payer: Self-pay | Source: Home / Self Care | Attending: Internal Medicine

## 2021-05-08 ENCOUNTER — Inpatient Hospital Stay: Payer: Self-pay

## 2021-05-08 DIAGNOSIS — K2970 Gastritis, unspecified, without bleeding: Secondary | ICD-10-CM | POA: Diagnosis not present

## 2021-05-08 DIAGNOSIS — I5022 Chronic systolic (congestive) heart failure: Secondary | ICD-10-CM | POA: Diagnosis not present

## 2021-05-08 DIAGNOSIS — I428 Other cardiomyopathies: Secondary | ICD-10-CM | POA: Diagnosis present

## 2021-05-08 DIAGNOSIS — J9611 Chronic respiratory failure with hypoxia: Secondary | ICD-10-CM | POA: Diagnosis present

## 2021-05-08 DIAGNOSIS — K5909 Other constipation: Secondary | ICD-10-CM | POA: Diagnosis present

## 2021-05-08 DIAGNOSIS — R0789 Other chest pain: Secondary | ICD-10-CM | POA: Diagnosis not present

## 2021-05-08 DIAGNOSIS — Z20822 Contact with and (suspected) exposure to covid-19: Secondary | ICD-10-CM | POA: Diagnosis present

## 2021-05-08 DIAGNOSIS — R911 Solitary pulmonary nodule: Secondary | ICD-10-CM | POA: Diagnosis not present

## 2021-05-08 DIAGNOSIS — K219 Gastro-esophageal reflux disease without esophagitis: Secondary | ICD-10-CM | POA: Diagnosis present

## 2021-05-08 DIAGNOSIS — Z87891 Personal history of nicotine dependence: Secondary | ICD-10-CM | POA: Diagnosis not present

## 2021-05-08 DIAGNOSIS — Z8673 Personal history of transient ischemic attack (TIA), and cerebral infarction without residual deficits: Secondary | ICD-10-CM | POA: Diagnosis not present

## 2021-05-08 DIAGNOSIS — I2782 Chronic pulmonary embolism: Secondary | ICD-10-CM

## 2021-05-08 DIAGNOSIS — I5042 Chronic combined systolic (congestive) and diastolic (congestive) heart failure: Secondary | ICD-10-CM | POA: Diagnosis present

## 2021-05-08 DIAGNOSIS — I110 Hypertensive heart disease with heart failure: Secondary | ICD-10-CM | POA: Diagnosis present

## 2021-05-08 DIAGNOSIS — E7800 Pure hypercholesterolemia, unspecified: Secondary | ICD-10-CM | POA: Diagnosis present

## 2021-05-08 DIAGNOSIS — K920 Hematemesis: Secondary | ICD-10-CM | POA: Diagnosis not present

## 2021-05-08 DIAGNOSIS — E11649 Type 2 diabetes mellitus with hypoglycemia without coma: Secondary | ICD-10-CM | POA: Diagnosis not present

## 2021-05-08 DIAGNOSIS — Z66 Do not resuscitate: Secondary | ICD-10-CM | POA: Diagnosis present

## 2021-05-08 DIAGNOSIS — Z95810 Presence of automatic (implantable) cardiac defibrillator: Secondary | ICD-10-CM | POA: Diagnosis not present

## 2021-05-08 DIAGNOSIS — D6859 Other primary thrombophilia: Secondary | ICD-10-CM | POA: Diagnosis present

## 2021-05-08 DIAGNOSIS — K2971 Gastritis, unspecified, with bleeding: Secondary | ICD-10-CM | POA: Diagnosis present

## 2021-05-08 DIAGNOSIS — Z86718 Personal history of other venous thrombosis and embolism: Secondary | ICD-10-CM | POA: Diagnosis not present

## 2021-05-08 DIAGNOSIS — I2510 Atherosclerotic heart disease of native coronary artery without angina pectoris: Secondary | ICD-10-CM | POA: Diagnosis present

## 2021-05-08 DIAGNOSIS — B3781 Candidal esophagitis: Secondary | ICD-10-CM | POA: Diagnosis present

## 2021-05-08 DIAGNOSIS — Z8249 Family history of ischemic heart disease and other diseases of the circulatory system: Secondary | ICD-10-CM | POA: Diagnosis not present

## 2021-05-08 DIAGNOSIS — R918 Other nonspecific abnormal finding of lung field: Secondary | ICD-10-CM

## 2021-05-08 DIAGNOSIS — Z9181 History of falling: Secondary | ICD-10-CM | POA: Diagnosis not present

## 2021-05-08 DIAGNOSIS — E1142 Type 2 diabetes mellitus with diabetic polyneuropathy: Secondary | ICD-10-CM | POA: Diagnosis present

## 2021-05-08 DIAGNOSIS — I252 Old myocardial infarction: Secondary | ICD-10-CM | POA: Diagnosis not present

## 2021-05-08 DIAGNOSIS — Z85118 Personal history of other malignant neoplasm of bronchus and lung: Secondary | ICD-10-CM | POA: Diagnosis not present

## 2021-05-08 DIAGNOSIS — I272 Pulmonary hypertension, unspecified: Secondary | ICD-10-CM | POA: Diagnosis present

## 2021-05-08 LAB — CBC
HCT: 43.4 % (ref 39.0–52.0)
Hemoglobin: 13.5 g/dL (ref 13.0–17.0)
MCH: 29.6 pg (ref 26.0–34.0)
MCHC: 31.1 g/dL (ref 30.0–36.0)
MCV: 95.2 fL (ref 80.0–100.0)
Platelets: 162 10*3/uL (ref 150–400)
RBC: 4.56 MIL/uL (ref 4.22–5.81)
RDW: 14.3 % (ref 11.5–15.5)
WBC: 7.9 10*3/uL (ref 4.0–10.5)
nRBC: 0 % (ref 0.0–0.2)

## 2021-05-08 LAB — CUP PACEART REMOTE DEVICE CHECK
Battery Remaining Longevity: 88 mo
Battery Remaining Percentage: 82 %
Battery Voltage: 2.96 V
Date Time Interrogation Session: 20220907020312
HighPow Impedance: 74 Ohm
Implantable Lead Implant Date: 20210308
Implantable Lead Implant Date: 20210308
Implantable Lead Implant Date: 20210308
Implantable Lead Location: 753858
Implantable Lead Location: 753859
Implantable Lead Location: 753860
Implantable Lead Model: 7122
Implantable Pulse Generator Implant Date: 20210308
Lead Channel Impedance Value: 430 Ohm
Lead Channel Impedance Value: 530 Ohm
Lead Channel Impedance Value: 940 Ohm
Lead Channel Pacing Threshold Amplitude: 0.625 V
Lead Channel Pacing Threshold Amplitude: 0.75 V
Lead Channel Pacing Threshold Amplitude: 1.25 V
Lead Channel Pacing Threshold Pulse Width: 0.5 ms
Lead Channel Pacing Threshold Pulse Width: 0.5 ms
Lead Channel Pacing Threshold Pulse Width: 0.5 ms
Lead Channel Sensing Intrinsic Amplitude: 11.8 mV
Lead Channel Sensing Intrinsic Amplitude: 4.4 mV
Lead Channel Setting Pacing Amplitude: 1.625
Lead Channel Setting Pacing Amplitude: 2 V
Lead Channel Setting Pacing Amplitude: 2.5 V
Lead Channel Setting Pacing Pulse Width: 0.5 ms
Lead Channel Setting Pacing Pulse Width: 0.5 ms
Lead Channel Setting Sensing Sensitivity: 0.5 mV
Pulse Gen Serial Number: 111019088

## 2021-05-08 LAB — BASIC METABOLIC PANEL
Anion gap: 7 (ref 5–15)
BUN: 13 mg/dL (ref 8–23)
CO2: 27 mmol/L (ref 22–32)
Calcium: 9 mg/dL (ref 8.9–10.3)
Chloride: 105 mmol/L (ref 98–111)
Creatinine, Ser: 1.06 mg/dL (ref 0.61–1.24)
GFR, Estimated: >60 mL/min (ref >60)
Glucose, Bld: 74 mg/dL (ref 70–99)
Potassium: 4 mmol/L (ref 3.5–5.1)
Sodium: 139 mmol/L (ref 135–145)

## 2021-05-08 LAB — GLUCOSE, CAPILLARY
Glucose-Capillary: 78 mg/dL (ref 70–99)
Glucose-Capillary: 82 mg/dL (ref 70–99)
Glucose-Capillary: 71 mg/dL (ref 70–99)
Glucose-Capillary: 81 mg/dL (ref 70–99)
Glucose-Capillary: 71 mg/dL (ref 70–99)
Glucose-Capillary: 65 mg/dL — ABNORMAL LOW (ref 70–99)

## 2021-05-08 SURGERY — ESOPHAGOGASTRODUODENOSCOPY (EGD) WITH PROPOFOL
Anesthesia: General

## 2021-05-08 MED ORDER — SODIUM CHLORIDE 0.9 % IV SOLN: INTRAVENOUS | Status: DC

## 2021-05-08 MED ORDER — HYDRALAZINE HCL 25 MG PO TABS
75.0000 mg | ORAL_TABLET | Freq: Three times a day (TID) | ORAL | Status: DC
  Administered 2021-05-08 – 2021-05-09 (×4): 75 mg via ORAL
  Filled 2021-05-08 (×4): qty 3

## 2021-05-08 MED ORDER — SODIUM CHLORIDE 0.9% FLUSH
10.0000 mL | Freq: Two times a day (BID) | INTRAVENOUS | Status: DC
  Administered 2021-05-08 – 2021-05-09 (×2): 10 mL

## 2021-05-08 MED ORDER — DEXTROSE 50 % IV SOLN: 12.5000 g | Freq: Once | INTRAVENOUS | Status: DC

## 2021-05-08 MED ORDER — SODIUM CHLORIDE 0.9% FLUSH: 10.0000 mL | INTRAVENOUS | Status: DC | PRN

## 2021-05-08 MED ORDER — DEXTROSE 50 % IV SOLN
INTRAVENOUS | Status: AC
  Filled 2021-05-08: qty 50

## 2021-05-08 MED ORDER — CHLORHEXIDINE GLUCONATE CLOTH 2 % EX PADS
6.0000 | MEDICATED_PAD | Freq: Every day | CUTANEOUS | Status: DC
  Administered 2021-05-08 – 2021-05-09 (×2): 6 via TOPICAL

## 2021-05-08 NOTE — Consult Note (Signed)
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 nodularity, likely post inflammatory in nature. 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 Recommend annual imaging followup by CTA or MRA. This recommendation follows 2010 ACCF/AHA/AATS/ACR/ASA/SCA/SCAI/SIR/STS/SVM Guidelines for the Diagnosis and Management of Patients with Thoracic Aortic Disease. Circulation.2010; 121: J825-K539. Aortic aneurysm NOS (ICD10-I71.9) No acute intra-abdominal pathology. Aortic Atherosclerosis (ICD10-I70.0) and Emphysema (ICD10-J43.9). Electronically Signed   By: Fidela Salisbury M.D.   On: 05/06/2021 23:45   DG Shoulder Left  Result Date: 05/06/2021 CLINICAL DATA:  Fall EXAM: LEFT SHOULDER - 2+ VIEW COMPARISON:  None. FINDINGS: Degenerative changes in the [facility] joint with joint space narrowing and spurring. Glenohumeral joint is maintained. No acute bony  abnormality. Specifically, no fracture, subluxation, or dislocation. Soft tissues are intact. 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 No acute bony abnormality. Electronically Signed   By: Rolm Baptise M.D.   On: 05/06/2021 22:31   CUP PACEART REMOTE DEVICE CHECK  Result Date: 05/08/2021 Scheduled remote reviewed. Normal device function.  Next remote 91 days. LR  DG Hip Unilat W or Wo Pelvis 2-3 Views Left  Result Date: 05/06/2021 CLINICAL DATA:  Fall, left hip pain EXAM: DG HIP (WITH OR WITHOUT PELVIS) 2-3V LEFT COMPARISON:  None FINDINGS: Mild symmetric degenerative changes in the hips with joint space narrowing and spurring. No acute bony abnormality. Specifically, no fracture, subluxation, or dislocation. IMPRESSION: No acute bony abnormality. Electronically Signed   By: Rolm Baptise M.D.   On: 05/06/2021 22:29   [place] EKG SITE RITE  Result Date: 05/08/2021 If Site Rite image not attached, placement could not be confirmed due to current cardiac rhythm.       ASSESSMENT:  1.  Left lung nodule with AP window lymph node: - He reports that he had a history of left lung cancer, treated with chemo and radiation therapy in 1996 in [place]. - CT chest with contrast on 05/06/2021 showed 13 x 15 mm left lower lobe cavitary nodule, increased from previous measurement of 8 x 12 mm and new from September 2020 exam.  Single enlarged AP window lymph node measuring 14 mm in short axis.  2.  Social/family history: - He is retired and worked in [employer].  He was an ex-smoker, quit 14 years ago, 2 packs/day. - No major family history.  PLAN:  1.  Cavitary left lower lobe lung nodule and AP window lymph node: - Highly suggestive of malignancy. - Reviewed images with the patient and discussed treatment plan. - Follow-up with me in the office 1 to 2 weeks after discharge.  PET scan as an outpatient to aid in further management.  Pulmonary evaluation for navigational bronchoscopy and  biopsy.  2.  Hematemesis: - Xarelto on hold.  He will have EGD tomorrow.  3.  History  of DVT and pulmonary embolism: - He reportedly had history of pulmonary embolism in 2017 and has been on Xarelto since then.  4.  History of left lung cancer: - Reportedly treated with chemo and radiation therapy in [place].  All questions were answered. The patient knows to call the clinic with any problems, questions or concerns. We can certainly see the patient much sooner if necessary.    Roger Lowe

## 2021-05-08 NOTE — Progress Notes (Signed)
PROGRESS NOTE    Roger Lowe.  IHK:[id number] DOB: 1946/07/17 DOA: 05/06/2021 PCP: [name], MD   Brief Narrative:   Roger Lowe  is a 75 y.o. male, with history of CAD, diabetes mellitus type 2, DVT/PE on Xarelto, hypertension, history of left-sided lung cancer status postresection, CVA, CHF with EF of 30 to 35%, AICD presents ED with a chief complaint of chest pain and hematemesis.  He is currently awaiting EGD and was supposed to have done on 9/7 but had poor IV access.  Assessment & Plan:   Active Problems:   Chest pain   Chronic hypoxemic respiratory failure (HCC)   Hyperlipidemia   Congestive heart failure (HCC)   Abnormal CT scan of lung   Essential hypertension   Type 2 diabetes mellitus (HCC)   Hematemesis   Chest pain - associated with hematemesis, resolved now.  Checked another HS troponin which has been reassuring.  Appreciate cardiology recommendations with no plans for ischemic testing at this time.  Cardiology has signed off.  Hematemesis - clears diet started, T&S, GI consult appreciated, planning for EGD on 9/8 after PICC line placed.     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  Plan to follow-up with pulmonology outpatient for this.   Chronic diastolic heart failure - resumed home carvedilol and spironolactone.   Chronic hypoxemic respiratory failure-wears 2 L nasal cannula at home.   Type 2 DM - follow CBGs and continue SSI coverage.  With some hypoglycemia noted.   Hyperlipidemia - resumed home statin therapy.    History of DVT/PE / acquired thrombophilia - hold home xarelto in setting of hematemesis.     DVT prophylaxis: SCDs Code Status: DNR Family Communication: None at bedside Disposition Plan:  Status is: Inpatient  Remains inpatient appropriate because:Ongoing diagnostic testing needed not appropriate for outpatient work up, IV treatments appropriate due to intensity of  illness or inability to take PO, and Inpatient level of care appropriate due to severity of illness  Dispo: The patient is from: Home              Anticipated d/c is to: Home              Patient currently is not medically stable to d/c.   Difficult to place patient No   Consultants:  GI Cardiology  Procedures:  See below  Antimicrobials:  None   Subjective: Patient seen and evaluated today with no new acute complaints or concerns. No acute concerns or events noted overnight.  He denies any further hematemesis or abdominal pain today.  Objective: Vitals:   05/08/21 0756 05/08/21 0825 05/08/21 1314 05/08/21 1330  BP:  (!) 150/85 (!) 141/79   Pulse:   82   Resp:   13   Temp:   97.9 F (36.6 C)   TempSrc:   Oral   SpO2: 99%   96%  Weight:      Height:        Intake/Output Summary (Last 24 hours) at 05/08/2021 1516 Last data filed at 05/08/2021 0500 Gross per 24 hour  Intake 200 ml  Output 900 ml  Net -700 ml   Filed Weights   05/06/21 1950  Weight: 105.6 kg    Examination:  General exam: Appears calm and comfortable  Respiratory system: Clear to auscultation. Respiratory effort normal.  2 L nasal cannula. Cardiovascular system: S1 & S2 heard, RRR.  Gastrointestinal system: Abdomen is soft  Central nervous system: Alert and awake Extremities: No edema Skin: No significant lesions noted Psychiatry: Flat affect.    Data Reviewed: I have personally reviewed following labs and imaging studies  CBC: Recent Labs  Lab 05/06/21 1952 05/07/21 0253 05/08/21 0528  WBC 7.5 7.9 7.9  HGB 15.7 15.4 13.5  HCT 49.7 48.1 43.4  MCV 95.0 94.3 95.2  PLT 177 186 478   Basic Metabolic Panel: Recent Labs  Lab 05/06/21 1952 05/07/21 0253 05/08/21 0528  NA 137 137 139  K 3.8 4.6 4.0  CL 103 104 105  CO2 28 25 27   GLUCOSE 110* 105* 74  BUN 15 15 13   CREATININE 1.33* 1.17 1.06  CALCIUM 9.1 9.4 9.0   GFR: Estimated Creatinine Clearance: 78 mL/min (by C-G formula  based on SCr of 1.06 mg/dL). Liver Function Tests: Recent Labs  Lab 05/06/21 2144  AST 20  ALT 20  ALKPHOS 82  BILITOT 0.6  PROT 7.1  ALBUMIN 3.6   Recent Labs  Lab 05/06/21 2144  LIPASE 21   No results for input(s): AMMONIA in the last 168 hours. Coagulation Profile: No results for input(s): INR, PROTIME in the last 168 hours. Cardiac Enzymes: No results for input(s): CKTOTAL, CKMB, CKMBINDEX, TROPONINI in the last 168 hours. BNP (last 3 results) No results for input(s): PROBNP in the last 8760 hours. HbA1C: Recent Labs    05/07/21 0253  HGBA1C 7.8*   CBG: Recent Labs  Lab 05/07/21 2054 05/08/21 0314 05/08/21 0747 05/08/21 1157 05/08/21 1446  GLUCAP 83 78 82 71 81   Lipid Profile: No results for input(s): CHOL, HDL, LDLCALC, TRIG, CHOLHDL, LDLDIRECT in the last 72 hours. Thyroid Function Tests: No results for input(s): TSH, T4TOTAL, FREET4, T3FREE, THYROIDAB in the last 72 hours. Anemia Panel: No results for input(s): VITAMINB12, FOLATE, FERRITIN, TIBC, IRON, RETICCTPCT in the last 72 hours. Sepsis Labs: No results for input(s): PROCALCITON, LATICACIDVEN in the last 168 hours.  Recent Results (from the past 240 hour(s))  Resp Panel by RT-PCR (Flu A&B, Covid) Nasopharyngeal Swab     Status: None   Collection Time: 05/07/21 12:14 AM   Specimen: Nasopharyngeal Swab; Nasopharyngeal(NP) swabs in vial transport medium  Result Value Ref Range Status   SARS Coronavirus 2 by RT PCR NEGATIVE NEGATIVE Final    Comment: (NOTE) SARS-CoV-2 target nucleic acids are NOT DETECTED.  The SARS-CoV-2 RNA is generally detectable in upper respiratory specimens during the acute phase of infection. The lowest concentration of SARS-CoV-2 viral copies this assay can detect is 138 copies/mL. A negative result does not preclude SARS-Cov-2 infection and should not be used as the sole basis for treatment or other patient management decisions. A negative result may occur with   improper specimen collection/handling, submission of specimen other than nasopharyngeal swab, presence of viral mutation(s) within the areas targeted by this assay, and inadequate number of viral copies(<138 copies/mL). A negative result must be combined with clinical observations, patient history, and epidemiological information. The expected result is Negative.  Fact Sheet for Patients:  EntrepreneurPulse.com.au  Fact Sheet for Healthcare Providers:  IncredibleEmployment.be  This test is no t yet approved or cleared by the [place] FDA and  has been authorized for detection and/or diagnosis of SARS-CoV-2 by FDA under an Emergency Use Authorization (EUA). This EUA will remain  in effect (meaning this test can be used) for the duration of the COVID-19 declaration under Section 564(b)(1) of the Act, 21 U.S.C.section 360bbb-3(b)(1), unless the authorization is terminated  or  revoked sooner.       Influenza A by PCR NEGATIVE NEGATIVE Final   Influenza B by PCR NEGATIVE NEGATIVE Final    Comment: (NOTE) The Xpert Xpress SARS-CoV-2/FLU/RSV plus assay is intended as an aid in the diagnosis of influenza from Nasopharyngeal swab specimens and should not be used as a sole basis for treatment. Nasal washings and aspirates are unacceptable for Xpert Xpress SARS-CoV-2/FLU/RSV testing.  Fact Sheet for Patients: EntrepreneurPulse.com.au  Fact Sheet for Healthcare Providers: IncredibleEmployment.be  This test is not yet approved or cleared by the [place] FDA and has been authorized for detection and/or diagnosis of SARS-CoV-2 by FDA under an Emergency Use Authorization (EUA). This EUA will remain in effect (meaning this test can be used) for the duration of the COVID-19 declaration under Section 564(b)(1) of the Act, 21 U.S.C. section 360bbb-3(b)(1), unless the authorization is terminated  or revoked.  Performed at [facility], [address] [place], [place] [address]   MRSA Next Gen by PCR, Nasal     Status: None   Collection Time: 05/07/21  6:03 PM   Specimen: Nasal Mucosa; Nasal Swab  Result Value Ref Range Status   MRSA by PCR Next Gen NOT DETECTED NOT DETECTED Final    Comment: (NOTE) The GeneXpert MRSA Assay (FDA approved for NASAL specimens only), is one component of a comprehensive MRSA colonization surveillance program. It is not intended to diagnose MRSA infection nor to guide or monitor treatment for MRSA infections. Test performance is not FDA approved in patients less than 61 years old. Performed at [facility], [address], [place],  [address]          Radiology Studies: DG Chest 2 View  Result Date: 05/06/2021 CLINICAL DATA:  Chest pain. Worsening cough for 2 weeks. Chronic cough. EXAM: CHEST - 2 VIEW COMPARISON:  Prior chest radiographs 01/01/2021 and earlier. FINDINGS: Left chest multi lead implantable cardiac device. Heart size at the upper limits of normal. Mild ill-defined opacity within the left lung base. The right lung is clear. No evidence of pleural effusion or pneumothorax. No acute bony abnormality identified. Degenerative changes of the spine. IMPRESSION: Mild ill-defined opacity within the left lung base, which may reflect atelectasis. Pneumonia is difficult to definitively exclude. Clinical correlation is recommended. Additionally, consider short-interval radiographic follow-up. Electronically Signed   By: Kellie Simmering D.O.   On: 05/06/2021 20:35   CT Chest W Contrast  Result Date: 05/06/2021 CLINICAL DATA:  Chest pain, hematemesis, gastrointestinal hemorrhage EXAM: CT CHEST, ABDOMEN, AND PELVIS WITH CONTRAST TECHNIQUE: Multidetector CT imaging of the chest, abdomen and pelvis was performed following the standard protocol during bolus administration of intravenous contrast. CONTRAST:  15mL OMNIPAQUE IOHEXOL 350 MG/ML SOLN  COMPARISON:  CT chest 08/28/2020, 10/11/2019 FINDINGS: CT CHEST FINDINGS Cardiovascular: No significant coronary artery calcification. Global cardiac size within normal limits. Left subclavian pacemaker is in place with leads within the right atrium, right ventricle toward the apex, and left ventricular venous outflow. No pericardial effusion. The central pulmonary arteries are of normal caliber. There is mild dilation of the descending thoracic aorta, measuring 3.1 cm in diameter at the level of the aortic isthmus. Distally, the descending thoracic aorta measures 2.8 cm in diameter at the level of the left atrium. The ascending aorta is not dilated. Mediastinum/Nodes: A single enlarged aortopulmonary window lymph node is seen measuring 14 mm in short axis diameter, axial image # 22/2, nonspecific. This appears enlarged since prior examination. No additional pathologic thoracic adenopathy. Visualized  thyroid is unremarkable. Esophagus is unremarkable. Lungs/Pleura: Mild centrilobular emphysema. There is extensive bibasilar cylindrical bronchiectasis, more severe within the left lower lobe with associated consolidation and volume loss. There is superimposed peribronchial nodularity within the left lower lobe and basilar lingula which is stable from prior examination and likely represents post inflammatory scarring. A enlarging cavitary nodule is seen within the left lower lobe measuring 13 mm x 15 mm at axial image # 83/7, previously measuring 8 mm x 12 mm and new from remote prior examination of 05/23/2019. An enlarging primary bronchogenic neoplasm is favored. No pneumothorax or pleural effusion.  No central obstructing lesion. Musculoskeletal: No acute bone abnormality. No lytic or blastic bone lesion. CT ABDOMEN PELVIS FINDINGS Hepatobiliary: No focal liver abnormality is seen. No gallstones, gallbladder wall thickening, or biliary dilatation. Pancreas: Unremarkable Spleen: Unremarkable Adrenals/Urinary Tract:  The adrenal glands are unremarkable. The kidneys are normal in size and position. Multiple simple cortical cysts are seen within the left kidney. The kidneys are otherwise unremarkable. The bladder is unremarkable. Stomach/Bowel: Stomach is within normal limits. Appendix appears normal. No evidence of bowel wall thickening, distention, or inflammatory changes. No free intraperitoneal gas or fluid. Vascular/Lymphatic: Moderate aortoiliac atherosclerotic calcification. No aortic aneurysm. No pathologic adenopathy within the abdomen and pelvis. Reproductive: Marked prostatic enlargement noted. Other: Tiny broad-based fat containing umbilical hernia. Rectum is unremarkable. Musculoskeletal: No acute bone abnormality. No lytic or blastic bone lesion. Degenerative changes are seen within the lumbar spine. IMPRESSION: Mean 14 mm cavitary nodule within the a left lower lobe demonstrating progressive enlargement since remote prior examinations suspicious for a primary bronchogenic neoplasm. Consider one of the following in 3 months for both low-risk and high-risk individuals: (a) repeat chest CT, (b) follow-up PET-CT, or (c) tissue sampling. This recommendation follows the consensus statement: Guidelines for Management of Incidental Pulmonary Nodules Detected on CT Images: From the Fleischner Society 2017; Radiology 2017; 284:228-243. Mild emphysema. Bibasilar bronchiectasis and stable peribronchial nodularity, likely post inflammatory in nature. 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 Recommend annual imaging followup by CTA or MRA. This recommendation follows 2010 ACCF/AHA/AATS/ACR/ASA/SCA/SCAI/SIR/STS/SVM Guidelines for the Diagnosis and Management of Patients with Thoracic Aortic Disease. Circulation.2010; 121: E454-U981. Aortic aneurysm NOS (ICD10-I71.9) No acute intra-abdominal pathology. Aortic Atherosclerosis (ICD10-I70.0) and Emphysema (ICD10-J43.9). Electronically Signed   By: Fidela Salisbury M.D.    On: 05/06/2021 23:45   CT ABDOMEN PELVIS W CONTRAST  Result Date: 05/06/2021 CLINICAL DATA:  Chest pain, hematemesis, gastrointestinal hemorrhage EXAM: CT CHEST, ABDOMEN, AND PELVIS WITH CONTRAST TECHNIQUE: Multidetector CT imaging of the chest, abdomen and pelvis was performed following the standard protocol during bolus administration of intravenous contrast. CONTRAST:  64mL OMNIPAQUE IOHEXOL 350 MG/ML SOLN COMPARISON:  CT chest 08/28/2020, 10/11/2019 FINDINGS: CT CHEST FINDINGS Cardiovascular: No significant coronary artery calcification. Global cardiac size within normal limits. Left subclavian pacemaker is in place with leads within the right atrium, right ventricle toward the apex, and left ventricular venous outflow. No pericardial effusion. The central pulmonary arteries are of normal caliber. There is mild dilation of the descending thoracic aorta, measuring 3.1 cm in diameter at the level of the aortic isthmus. Distally, the descending thoracic aorta measures 2.8 cm in diameter at the level of the left atrium. The ascending aorta is not dilated. Mediastinum/Nodes: A single enlarged aortopulmonary window lymph node is seen measuring 14 mm in short axis diameter, axial image # 22/2, nonspecific. This appears enlarged since prior examination. No additional pathologic thoracic adenopathy. Visualized thyroid is unremarkable. Esophagus  is unremarkable. Lungs/Pleura: Mild centrilobular emphysema. There is extensive bibasilar cylindrical bronchiectasis, more severe within the left lower lobe with associated consolidation and volume loss. There is superimposed peribronchial nodularity within the left lower lobe and basilar lingula which is stable from prior examination and likely represents post inflammatory scarring. A enlarging cavitary nodule is seen within the left lower lobe measuring 13 mm x 15 mm at axial image # 83/7, previously measuring 8 mm x 12 mm and new from remote prior examination of  05/23/2019. An enlarging primary bronchogenic neoplasm is favored. No pneumothorax or pleural effusion.  No central obstructing lesion. Musculoskeletal: No acute bone abnormality. No lytic or blastic bone lesion. CT ABDOMEN PELVIS FINDINGS Hepatobiliary: No focal liver abnormality is seen. No gallstones, gallbladder wall thickening, or biliary dilatation. Pancreas: Unremarkable Spleen: Unremarkable Adrenals/Urinary Tract: The adrenal glands are unremarkable. The kidneys are normal in size and position. Multiple simple cortical cysts are seen within the left kidney. The kidneys are otherwise unremarkable. The bladder is unremarkable. Stomach/Bowel: Stomach is within normal limits. Appendix appears normal. No evidence of bowel wall thickening, distention, or inflammatory changes. No free intraperitoneal gas or fluid. Vascular/Lymphatic: Moderate aortoiliac atherosclerotic calcification. No aortic aneurysm. No pathologic adenopathy within the abdomen and pelvis. Reproductive: Marked prostatic enlargement noted. Other: Tiny broad-based fat containing umbilical hernia. Rectum is unremarkable. Musculoskeletal: No acute bone abnormality. No lytic or blastic bone lesion. Degenerative changes are seen within the lumbar spine. IMPRESSION: Mean 14 mm cavitary nodule within the a left lower lobe demonstrating progressive enlargement since remote prior examinations suspicious for a primary bronchogenic neoplasm. Consider one of the following in 3 months for both low-risk and high-risk individuals: (a) repeat chest CT, (b) follow-up PET-CT, or (c) tissue sampling. This recommendation follows the consensus statement: Guidelines for Management of Incidental Pulmonary Nodules Detected on CT Images: From the Fleischner Society 2017; Radiology 2017; 284:228-243. Mild emphysema. Bibasilar bronchiectasis and stable peribronchial nodularity, likely post inflammatory in nature. 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  Recommend annual imaging followup by CTA or MRA. This recommendation follows 2010 ACCF/AHA/AATS/ACR/ASA/SCA/SCAI/SIR/STS/SVM Guidelines for the Diagnosis and Management of Patients with Thoracic Aortic Disease. Circulation.2010; 121: N867-E720. Aortic aneurysm NOS (ICD10-I71.9) No acute intra-abdominal pathology. Aortic Atherosclerosis (ICD10-I70.0) and Emphysema (ICD10-J43.9). Electronically Signed   By: Fidela Salisbury M.D.   On: 05/06/2021 23:45   DG Shoulder Left  Result Date: 05/06/2021 CLINICAL DATA:  Fall EXAM: LEFT SHOULDER - 2+ VIEW COMPARISON:  None. FINDINGS: Degenerative changes in the [facility] joint with joint space narrowing and spurring. Glenohumeral joint is maintained. No acute bony abnormality. Specifically, no fracture, subluxation, or dislocation. Soft tissues are intact. 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 No acute bony abnormality. Electronically Signed   By: Rolm Baptise M.D.   On: 05/06/2021 22:31   CUP PACEART REMOTE DEVICE CHECK  Result Date: 05/08/2021 Scheduled remote reviewed. Normal device function.  Next remote 91 days. LR  DG Hip Unilat W or Wo Pelvis 2-3 Views Left  Result Date: 05/06/2021 CLINICAL DATA:  Fall, left hip pain EXAM: DG HIP (WITH OR WITHOUT PELVIS) 2-3V LEFT COMPARISON:  None FINDINGS: Mild symmetric degenerative changes in the hips with joint space narrowing and spurring. No acute bony abnormality. Specifically, no fracture, subluxation, or dislocation. IMPRESSION: No acute bony abnormality. Electronically Signed   By: Rolm Baptise M.D.   On: 05/06/2021 22:29   [place] EKG SITE RITE  Result Date: 05/08/2021 If Site Rite image not attached, placement could not  be confirmed due to current cardiac rhythm.       Scheduled Meds:  amitriptyline  50 mg Oral BID   atorvastatin  40 mg Oral Daily   budesonide  0.5 mg Nebulization Daily   carvedilol  25 mg Oral BID   dextrose  12.5 g Intravenous Once   dextrose       gabapentin  100 mg Oral TID    hydrALAZINE  75 mg Oral TID   insulin aspart  0-15 Units Subcutaneous TID WC   insulin aspart  0-5 Units Subcutaneous QHS   insulin detemir  20 Units Subcutaneous QHS   pantoprazole (PROTONIX) IV  40 mg Intravenous Q12H   polyethylene glycol  17 g Oral Daily   spironolactone  25 mg Oral Daily   umeclidinium bromide  1 puff Inhalation Daily    LOS: 0 days    Time spent: 35 minutes    Makaria Poarch Darleen Crocker, DO [facility]  If 7PM-7AM, please contact night-coverage [email or web address] 05/08/2021, 3:16 PM

## 2021-05-08 NOTE — Anesthesia Preprocedure Evaluation (Signed)
Anesthesia Evaluation  Patient identified by MRN, date of birth, ID band Patient awake    Reviewed: Allergy & Precautions, H&P , NPO status , Patient's Chart, lab work & pertinent test results, reviewed documented beta blocker date and time   Airway Mallampati: II  TM Distance: >3 FB Neck ROM: full    Dental no notable dental hx.    Pulmonary asthma , pneumonia, resolved, COPD, Patient abstained from smoking., former smoker,    Pulmonary exam normal breath sounds clear to auscultation       Cardiovascular Exercise Tolerance: Good hypertension, + CAD, + Past MI, +CHF and + DOE  + Cardiac Defibrillator  Rhythm:regular Rate:Normal     Neuro/Psych  Headaches, CVA, No Residual Symptoms negative psych ROS   GI/Hepatic Neg liver ROS, GERD  ,  Endo/Other  negative endocrine ROSdiabetes  Renal/GU CRFRenal disease  negative genitourinary   Musculoskeletal   Abdominal   Peds  Hematology negative hematology ROS (+)   Anesthesia Other Findings 1. Left ventricular ejection fraction, by estimation, is 55 to 60%. The  left ventricle has normal function. The left ventricle has no regional  wall motion abnormalities. There is mild left ventricular hypertrophy.  Left ventricular diastolic parameters  are indeterminate.  2. Right ventricular systolic function is normal. The right ventricular  size is normal.  3. The mitral valve is normal in structure. Trivial mitral valve  regurgitation. No evidence of mitral stenosis.  4. The aortic valve is tricuspid. There is mild calcification of the  aortic valve. There is mild thickening of the aortic valve. Aortic valve  regurgitation is not visualized. No aortic stenosis is present.  5. The inferior vena cava is normal in size with greater than 50%  respiratory variability, suggesting right atrial pressure of 3 mmHg.   Reproductive/Obstetrics negative OB ROS                              Anesthesia Physical Anesthesia Plan  ASA: 3 and emergent  Anesthesia Plan: General   Post-op Pain Management:    Induction:   PONV Risk Score and Plan: Propofol infusion  Airway Management Planned:   Additional Equipment:   Intra-op Plan:   Post-operative Plan:   Informed Consent: I have reviewed the patients History and Physical, chart, labs and discussed the procedure including the risks, benefits and alternatives for the proposed anesthesia with the patient or authorized representative who has indicated his/her understanding and acceptance.     Dental Advisory Given  Plan Discussed with: CRNA  Anesthesia Plan Comments:         Anesthesia Quick Evaluation

## 2021-05-08 NOTE — Care Management Obs Status (Signed)
[place] NOTIFICATION   Patient Details  Name: Roger Lowe. MRN: [id number] Date of Birth: September 13, 1945   Medicare Observation Status Notification Given:  Yes    Tommy Medal 05/08/2021, 12:08 PM

## 2021-05-08 NOTE — Progress Notes (Signed)
Peripherally Inserted Central Catheter Placement  The IV Nurse has discussed with the patient and/or persons authorized to consent for the patient, the purpose of this procedure and the potential benefits and risks involved with this procedure.  The benefits include less needle sticks, lab draws from the catheter, and the patient may be discharged home with the catheter. Risks include, but not limited to, infection, bleeding, blood clot (thrombus formation), and puncture of an artery; nerve damage and irregular heartbeat and possibility to perform a PICC exchange if needed/ordered by physician.  Alternatives to this procedure were also discussed.  Bard Power PICC patient education guide, fact sheet on infection prevention and patient information card has been provided to patient /or left at bedside.    PICC Placement Documentation  PICC Double Lumen 05/08/21 PICC Right Brachial 43 cm 0 cm (Active)  Indication for Insertion or Continuance of Line Poor Vasculature-patient has had multiple peripheral attempts or PIVs lasting less than 24 hours 05/08/21 1556  Exposed Catheter (cm) 0 cm 05/08/21 1556  Site Assessment Clean;Dry;Intact 05/08/21 1556  Lumen #1 Status Flushed;Blood return noted;Saline locked 05/08/21 1556  Lumen #2 Status Flushed;Blood return noted;Saline locked 05/08/21 1556  Dressing Type Transparent 05/08/21 1556  Dressing Status Clean;Dry;Intact 05/08/21 1556  Antimicrobial disc in place? Yes 05/08/21 1556  Dressing Change Due 05/15/21 05/08/21 1556       Roger Lowe 05/08/2021, 3:58 PM

## 2021-05-08 NOTE — Progress Notes (Signed)
Progress Note  Patient Name: Roger Lowe. Date of Encounter: 05/08/2021  [facility] Cardiologist: Carlyle Dolly, MD  Subjective   Slight transient chest pain this morning.   Inpatient Medications    Scheduled Meds:  amitriptyline  50 mg Oral BID   atorvastatin  40 mg Oral Daily   budesonide  0.5 mg Nebulization Daily   carvedilol  25 mg Oral BID   gabapentin  100 mg Oral TID   hydrALAZINE  50 mg Oral TID   insulin aspart  0-15 Units Subcutaneous TID WC   insulin aspart  0-5 Units Subcutaneous QHS   insulin detemir  20 Units Subcutaneous QHS   pantoprazole (PROTONIX) IV  40 mg Intravenous Q12H   polyethylene glycol  17 g Oral Daily   spironolactone  25 mg Oral Daily   umeclidinium bromide  1 puff Inhalation Daily   Continuous Infusions:  PRN Meds: acetaminophen **OR** acetaminophen, albuterol, HYDROcodone-acetaminophen, methocarbamol, morphine injection, ondansetron **OR** ondansetron (ZOFRAN) IV   Vital Signs    Vitals:   05/07/21 2053 05/08/21 0314 05/08/21 0756 05/08/21 0825  BP: (!) 155/88 134/84  (!) 150/85  Pulse: 72 76    Resp: 18 20    Temp: 97.6 F (36.4 C) 98 F (36.7 C)    TempSrc: Oral     SpO2: 97% 100% 99%   Weight:      Height:        Intake/Output Summary (Last 24 hours) at 05/08/2021 0901 Last data filed at 05/08/2021 0500 Gross per 24 hour  Intake 200 ml  Output 900 ml  Net -700 ml   Last 3 Weights 05/06/2021 04/19/2021 04/16/2021  Weight (lbs) 232 lb 12.9 oz 233 lb 229 lb 11.5 oz  Weight (kg) 105.6 kg 105.688 kg 104.2 kg      Telemetry    A sensed V paced - Personally Reviewed  ECG    N/a - Personally Reviewed  Physical Exam   GEN: No acute distress.   Neck: No JVD Cardiac: RRR, no murmurs, rubs, or gallops.  Respiratory: Clear to auscultation bilaterally. GI: Soft, nontender, non-distended  MS: No edema; No deformity. Neuro:  Nonfocal  Psych: Normal affect   Labs    High Sensitivity Troponin:   Recent Labs  Lab  05/06/21 1952 05/06/21 2144  TROPONINIHS 11 11      Chemistry Recent Labs  Lab 05/06/21 1952 05/06/21 2144 05/07/21 0253 05/08/21 0528  NA 137  --  137 139  K 3.8  --  4.6 4.0  CL 103  --  104 105  CO2 28  --  25 27  GLUCOSE 110*  --  105* 74  BUN 15  --  15 13  CREATININE 1.33*  --  1.17 1.06  CALCIUM 9.1  --  9.4 9.0  PROT  --  7.1  --   --   ALBUMIN  --  3.6  --   --   AST  --  20  --   --   ALT  --  20  --   --   ALKPHOS  --  82  --   --   BILITOT  --  0.6  --   --   GFRNONAA 56*  --  >60 >60  ANIONGAP 6  --  8 7     Hematology Recent Labs  Lab 05/06/21 1952 05/07/21 0253 05/08/21 0528  WBC 7.5 7.9 7.9  RBC 5.23 5.10 4.56  HGB 15.7 15.4 13.5  HCT 49.7 48.1 43.4  MCV 95.0 94.3 95.2  MCH 30.0 30.2 29.6  MCHC 31.6 32.0 31.1  RDW 14.6 14.6 14.3  PLT 177 186 162    BNPNo results for input(s): BNP, PROBNP in the last 168 hours.   DDimer No results for input(s): DDIMER in the last 168 hours.   Radiology    DG Chest 2 View  Result Date: 05/06/2021 CLINICAL DATA:  Chest pain. Worsening cough for 2 weeks. Chronic cough. EXAM: CHEST - 2 VIEW COMPARISON:  Prior chest radiographs 01/01/2021 and earlier. FINDINGS: Left chest multi lead implantable cardiac device. Heart size at the upper limits of normal. Mild ill-defined opacity within the left lung base. The right lung is clear. No evidence of pleural effusion or pneumothorax. No acute bony abnormality identified. Degenerative changes of the spine. IMPRESSION: Mild ill-defined opacity within the left lung base, which may reflect atelectasis. Pneumonia is difficult to definitively exclude. Clinical correlation is recommended. Additionally, consider short-interval radiographic follow-up. Electronically Signed   By: Kellie Simmering D.O.   On: 05/06/2021 20:35   CT Chest W Contrast  Result Date: 05/06/2021 CLINICAL DATA:  Chest pain, hematemesis, gastrointestinal hemorrhage EXAM: CT CHEST, ABDOMEN, AND PELVIS WITH CONTRAST  TECHNIQUE: Multidetector CT imaging of the chest, abdomen and pelvis was performed following the standard protocol during bolus administration of intravenous contrast. CONTRAST:  24mL OMNIPAQUE IOHEXOL 350 MG/ML SOLN COMPARISON:  CT chest 08/28/2020, 10/11/2019 FINDINGS: CT CHEST FINDINGS Cardiovascular: No significant coronary artery calcification. Global cardiac size within normal limits. Left subclavian pacemaker is in place with leads within the right atrium, right ventricle toward the apex, and left ventricular venous outflow. No pericardial effusion. The central pulmonary arteries are of normal caliber. There is mild dilation of the descending thoracic aorta, measuring 3.1 cm in diameter at the level of the aortic isthmus. Distally, the descending thoracic aorta measures 2.8 cm in diameter at the level of the left atrium. The ascending aorta is not dilated. Mediastinum/Nodes: A single enlarged aortopulmonary window lymph node is seen measuring 14 mm in short axis diameter, axial image # 22/2, nonspecific. This appears enlarged since prior examination. No additional pathologic thoracic adenopathy. Visualized thyroid is unremarkable. Esophagus is unremarkable. Lungs/Pleura: Mild centrilobular emphysema. There is extensive bibasilar cylindrical bronchiectasis, more severe within the left lower lobe with associated consolidation and volume loss. There is superimposed peribronchial nodularity within the left lower lobe and basilar lingula which is stable from prior examination and likely represents post inflammatory scarring. A enlarging cavitary nodule is seen within the left lower lobe measuring 13 mm x 15 mm at axial image # 83/7, previously measuring 8 mm x 12 mm and new from remote prior examination of 05/23/2019. An enlarging primary bronchogenic neoplasm is favored. No pneumothorax or pleural effusion.  No central obstructing lesion. Musculoskeletal: No acute bone abnormality. No lytic or blastic bone  lesion. CT ABDOMEN PELVIS FINDINGS Hepatobiliary: No focal liver abnormality is seen. No gallstones, gallbladder wall thickening, or biliary dilatation. Pancreas: Unremarkable Spleen: Unremarkable Adrenals/Urinary Tract: The adrenal glands are unremarkable. The kidneys are normal in size and position. Multiple simple cortical cysts are seen within the left kidney. The kidneys are otherwise unremarkable. The bladder is unremarkable. Stomach/Bowel: Stomach is within normal limits. Appendix appears normal. No evidence of bowel wall thickening, distention, or inflammatory changes. No free intraperitoneal gas or fluid. Vascular/Lymphatic: Moderate aortoiliac atherosclerotic calcification. No aortic aneurysm. No pathologic adenopathy within the abdomen and pelvis. Reproductive: Marked prostatic enlargement noted. Other: Tiny broad-based fat  containing umbilical hernia. Rectum is unremarkable. Musculoskeletal: No acute bone abnormality. No lytic or blastic bone lesion. Degenerative changes are seen within the lumbar spine. IMPRESSION: Mean 14 mm cavitary nodule within the a left lower lobe demonstrating progressive enlargement since remote prior examinations suspicious for a primary bronchogenic neoplasm. Consider one of the following in 3 months for both low-risk and high-risk individuals: (a) repeat chest CT, (b) follow-up PET-CT, or (c) tissue sampling. This recommendation follows the consensus statement: Guidelines for Management of Incidental Pulmonary Nodules Detected on CT Images: From the Fleischner Society 2017; Radiology 2017; 284:228-243. Mild emphysema. Bibasilar bronchiectasis and stable peribronchial nodularity, likely post inflammatory in nature. 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 Recommend annual imaging followup by CTA or MRA. This recommendation follows 2010 ACCF/AHA/AATS/ACR/ASA/SCA/SCAI/SIR/STS/SVM Guidelines for the Diagnosis and Management of Patients with Thoracic Aortic  Disease. Circulation.2010; 121: U045-W098. Aortic aneurysm NOS (ICD10-I71.9) No acute intra-abdominal pathology. Aortic Atherosclerosis (ICD10-I70.0) and Emphysema (ICD10-J43.9). Electronically Signed   By: Fidela Salisbury M.D.   On: 05/06/2021 23:45   CT ABDOMEN PELVIS W CONTRAST  Result Date: 05/06/2021 CLINICAL DATA:  Chest pain, hematemesis, gastrointestinal hemorrhage EXAM: CT CHEST, ABDOMEN, AND PELVIS WITH CONTRAST TECHNIQUE: Multidetector CT imaging of the chest, abdomen and pelvis was performed following the standard protocol during bolus administration of intravenous contrast. CONTRAST:  68mL OMNIPAQUE IOHEXOL 350 MG/ML SOLN COMPARISON:  CT chest 08/28/2020, 10/11/2019 FINDINGS: CT CHEST FINDINGS Cardiovascular: No significant coronary artery calcification. Global cardiac size within normal limits. Left subclavian pacemaker is in place with leads within the right atrium, right ventricle toward the apex, and left ventricular venous outflow. No pericardial effusion. The central pulmonary arteries are of normal caliber. There is mild dilation of the descending thoracic aorta, measuring 3.1 cm in diameter at the level of the aortic isthmus. Distally, the descending thoracic aorta measures 2.8 cm in diameter at the level of the left atrium. The ascending aorta is not dilated. Mediastinum/Nodes: A single enlarged aortopulmonary window lymph node is seen measuring 14 mm in short axis diameter, axial image # 22/2, nonspecific. This appears enlarged since prior examination. No additional pathologic thoracic adenopathy. Visualized thyroid is unremarkable. Esophagus is unremarkable. Lungs/Pleura: Mild centrilobular emphysema. There is extensive bibasilar cylindrical bronchiectasis, more severe within the left lower lobe with associated consolidation and volume loss. There is superimposed peribronchial nodularity within the left lower lobe and basilar lingula which is stable from prior examination and likely  represents post inflammatory scarring. A enlarging cavitary nodule is seen within the left lower lobe measuring 13 mm x 15 mm at axial image # 83/7, previously measuring 8 mm x 12 mm and new from remote prior examination of 05/23/2019. An enlarging primary bronchogenic neoplasm is favored. No pneumothorax or pleural effusion.  No central obstructing lesion. Musculoskeletal: No acute bone abnormality. No lytic or blastic bone lesion. CT ABDOMEN PELVIS FINDINGS Hepatobiliary: No focal liver abnormality is seen. No gallstones, gallbladder wall thickening, or biliary dilatation. Pancreas: Unremarkable Spleen: Unremarkable Adrenals/Urinary Tract: The adrenal glands are unremarkable. The kidneys are normal in size and position. Multiple simple cortical cysts are seen within the left kidney. The kidneys are otherwise unremarkable. The bladder is unremarkable. Stomach/Bowel: Stomach is within normal limits. Appendix appears normal. No evidence of bowel wall thickening, distention, or inflammatory changes. No free intraperitoneal gas or fluid. Vascular/Lymphatic: Moderate aortoiliac atherosclerotic calcification. No aortic aneurysm. No pathologic adenopathy within the abdomen and pelvis. Reproductive: Marked prostatic enlargement noted. Other: Tiny broad-based fat containing umbilical hernia. Rectum  is unremarkable. Musculoskeletal: No acute bone abnormality. No lytic or blastic bone lesion. Degenerative changes are seen within the lumbar spine. IMPRESSION: Mean 14 mm cavitary nodule within the a left lower lobe demonstrating progressive enlargement since remote prior examinations suspicious for a primary bronchogenic neoplasm. Consider one of the following in 3 months for both low-risk and high-risk individuals: (a) repeat chest CT, (b) follow-up PET-CT, or (c) tissue sampling. This recommendation follows the consensus statement: Guidelines for Management of Incidental Pulmonary Nodules Detected on CT Images: From the  Fleischner Society 2017; Radiology 2017; 284:228-243. Mild emphysema. Bibasilar bronchiectasis and stable peribronchial nodularity, likely post inflammatory in nature. 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 Recommend annual imaging followup by CTA or MRA. This recommendation follows 2010 ACCF/AHA/AATS/ACR/ASA/SCA/SCAI/SIR/STS/SVM Guidelines for the Diagnosis and Management of Patients with Thoracic Aortic Disease. Circulation.2010; 121: O350-K938. Aortic aneurysm NOS (ICD10-I71.9) No acute intra-abdominal pathology. Aortic Atherosclerosis (ICD10-I70.0) and Emphysema (ICD10-J43.9). Electronically Signed   By: Fidela Salisbury M.D.   On: 05/06/2021 23:45   DG Shoulder Left  Result Date: 05/06/2021 CLINICAL DATA:  Fall EXAM: LEFT SHOULDER - 2+ VIEW COMPARISON:  None. FINDINGS: Degenerative changes in the [facility] joint with joint space narrowing and spurring. Glenohumeral joint is maintained. No acute bony abnormality. Specifically, no fracture, subluxation, or dislocation. Soft tissues are intact. 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 No acute bony abnormality. Electronically Signed   By: Rolm Baptise M.D.   On: 05/06/2021 22:31   DG Hip Unilat W or Wo Pelvis 2-3 Views Left  Result Date: 05/06/2021 CLINICAL DATA:  Fall, left hip pain EXAM: DG HIP (WITH OR WITHOUT PELVIS) 2-3V LEFT COMPARISON:  None FINDINGS: Mild symmetric degenerative changes in the hips with joint space narrowing and spurring. No acute bony abnormality. Specifically, no fracture, subluxation, or dislocation. IMPRESSION: No acute bony abnormality. Electronically Signed   By: Rolm Baptise M.D.   On: 05/06/2021 22:29    Cardiac Studies     Patient Profile     Roger Lowe. is a 75 y.o. male with a hx of nonobstructive CAD, NICM who is being seen 05/07/2021 for the evaluation of chest pain at the request of Dr. Wynetta Emery.  Assessment & Plan    1.CAD/chest pain - 2016 cath [facility]: nonobstructive disease -  10/2018 cath [name]: nonobstructive disease  -trops neg this admission, EKG with chronic changes - presented with N/V, hematemesis. From notes on admission pain thought to be more pleuritic.  - atypical chest pain without objective evidence of ischemia with nonobstructive cath 2020 admitted with N/V hematemsis, no plans for ischemic testing at this time  2. Chronic systolic HF - LVEF 18-29% in 2020, by 08/2020 echo had normalized to 55-60% - no evidence of fluid [place] - continue home CHF regimen - from last cards note with PA Leonides Sake reported pcp had stopped his entresto, appears it was due to cough.  - wigh high bp's room to titrate hydral, increase to 75mg  tid  3. Cavitary lung nodule - per primary team, concerning for bronchogenic neoplasm  No contraindication to GI procedures from cardiac standpoint.   No further cardiology recs at this time, we will sign of inpatient care  For questions or updates, please contact [place] Please consult [email or web address] for contact info under        Signed, Carlyle Dolly, MD  05/08/2021, 9:01 AM

## 2021-05-08 NOTE — Progress Notes (Signed)
Subjective:  Patient states he has not vomited today.  He had 4 episodes of hematemesis prior to admission.  He denies nausea.  He is hungry.  He is still complains of periumbilical abdominal pain.  He denies hemoptysis.  At  Current Medications:  Current Facility-Administered Medications:    acetaminophen (TYLENOL) tablet 650 mg, 650 mg, Oral, Q6H PRN **OR** acetaminophen (TYLENOL) suppository 650 mg, 650 mg, Rectal, Q6H PRN, Zierle-Ghosh, Asia B, DO   albuterol (VENTOLIN HFA) 108 (90 Base) MCG/ACT inhaler 1-2 puff, 1-2 puff, Inhalation, Q6H PRN, Zierle-Ghosh, Asia B, DO   amitriptyline (ELAVIL) tablet 50 mg, 50 mg, Oral, BID, Zierle-Ghosh, Asia B, DO, 50 mg at 05/08/21 0823   atorvastatin (LIPITOR) tablet 40 mg, 40 mg, Oral, Daily, Zierle-Ghosh, Asia B, DO, 40 mg at 05/08/21 0825   budesonide (PULMICORT) nebulizer solution 0.5 mg, 0.5 mg, Nebulization, Daily, Zierle-Ghosh, Asia B, DO, 0.5 mg at 05/08/21 0756   carvedilol (COREG) tablet 25 mg, 25 mg, Oral, BID, Zierle-Ghosh, Asia B, DO, 25 mg at 05/08/21 1103   Chlorhexidine Gluconate Cloth 2 % PADS 6 each, 6 each, Topical, Daily, Manuella Ghazi, Pratik D, DO, 6 each at 05/08/21 1644   Chlorhexidine Gluconate Cloth 2 % PADS 6 each, 6 each, Topical, Daily, Manuella Ghazi, Pratik D, DO, 6 each at 05/08/21 1644   dextrose 50 % solution 12.5 g, 12.5 g, Intravenous, Once, Manuella Ghazi, Pratik D, DO   dextrose 50 % solution, , , ,    gabapentin (NEURONTIN) capsule 100 mg, 100 mg, Oral, TID, Zierle-Ghosh, Asia B, DO, 100 mg at 05/08/21 1648   hydrALAZINE (APRESOLINE) tablet 75 mg, 75 mg, Oral, TID, Arnoldo Lenis, MD, 75 mg at 05/08/21 1648   HYDROcodone-acetaminophen (NORCO) 10-325 MG per tablet 1 tablet, 1 tablet, Oral, Q6H PRN, Zierle-Ghosh, Asia B, DO, 1 tablet at 05/07/21 2057   insulin aspart (novoLOG) injection 0-15 Units, 0-15 Units, Subcutaneous, TID WC, Zierle-Ghosh, Asia B, DO   insulin aspart (novoLOG) injection 0-5 Units, 0-5 Units, Subcutaneous, QHS,  Zierle-Ghosh, Asia B, DO   insulin detemir (LEVEMIR) injection 20 Units, 20 Units, Subcutaneous, QHS, Johnson, Clanford L, MD, 20 Units at 05/07/21 2200   methocarbamol (ROBAXIN) tablet 500 mg, 500 mg, Oral, Q8H PRN, Zierle-Ghosh, Asia B, DO   morphine 2 MG/ML injection 1 mg, 1 mg, Intravenous, Q4H PRN, Johnson, Clanford L, MD   ondansetron (ZOFRAN) tablet 4 mg, 4 mg, Oral, Q6H PRN **OR** ondansetron (ZOFRAN) injection 4 mg, 4 mg, Intravenous, Q6H PRN, Zierle-Ghosh, Asia B, DO   pantoprazole (PROTONIX) injection 40 mg, 40 mg, Intravenous, Q12H, Zierle-Ghosh, Asia B, DO, 40 mg at 05/08/21 1036   polyethylene glycol (MIRALAX / GLYCOLAX) packet 17 g, 17 g, Oral, Daily, Harper, Kristen S, PA-C, 17 g at 05/07/21 1017   sodium chloride flush (NS) 0.9 % injection 10-40 mL, 10-40 mL, Intracatheter, Q12H, Shah, Pratik D, DO   sodium chloride flush (NS) 0.9 % injection 10-40 mL, 10-40 mL, Intracatheter, PRN, Manuella Ghazi, Pratik D, DO   spironolactone (ALDACTONE) tablet 25 mg, 25 mg, Oral, Daily, Zierle-Ghosh, Asia B, DO, 25 mg at 05/08/21 1035   umeclidinium bromide (INCRUSE ELLIPTA) 62.5 MCG/INH 1 puff, 1 puff, Inhalation, Daily, Zierle-Ghosh, Asia B, DO, 1 puff at 05/08/21 0756   Objective: Blood pressure (!) 152/84, pulse 82, temperature 97.9 F (36.6 C), temperature source Oral, resp. rate 13, height 6' (1.829 m), weight 105.6 kg, SpO2 96 %. Patient is alert and in no acute distress.  Labs/studies Results:   CBC Latest Ref  Rng & Units 05/08/2021 05/07/2021 05/06/2021  WBC 4.0 - 10.5 K/uL 7.9 7.9 7.5  Hemoglobin 13.0 - 17.0 g/dL 13.5 15.4 15.7  Hematocrit 39.0 - 52.0 % 43.4 48.1 49.7  Platelets 150 - 400 K/uL 162 186 177    CMP Latest Ref Rng & Units 05/08/2021 05/07/2021 05/06/2021  Glucose 70 - 99 mg/dL 74 105(H) 110(H)  BUN 8 - 23 mg/dL 13 15 15   Creatinine 0.61 - 1.24 mg/dL 1.06 1.17 1.33(H)  Sodium 135 - 145 mmol/L 139 137 137  Potassium 3.5 - 5.1 mmol/L 4.0 4.6 3.8  Chloride 98 - 111 mmol/L 105 104 103   CO2 22 - 32 mmol/L 27 25 28   Calcium 8.9 - 10.3 mg/dL 9.0 9.4 9.1  Total Protein 6.5 - 8.1 g/dL - - 7.1  Total Bilirubin 0.3 - 1.2 mg/dL - - 0.6  Alkaline Phos 38 - 126 U/L - - 82  AST 15 - 41 U/L - - 20  ALT 0 - 44 U/L - - 20    Hepatic Function Latest Ref Rng & Units 05/06/2021 08/03/2020 02/01/2019  Total Protein 6.5 - 8.1 g/dL 7.1 7.3 8.3(H)  Albumin 3.5 - 5.0 g/dL 3.6 - 4.0  AST 15 - 41 U/L 20 15 16   ALT 0 - 44 U/L 20 11 15   Alk Phosphatase 38 - 126 U/L 82 - 88  Total Bilirubin 0.3 - 1.2 mg/dL 0.6 0.4 0.6  Bilirubin, Direct 0.0 - 0.2 mg/dL 0.2 - -   Assessment:  #1.  Hematemesis.  It remains to be seen if he has peptic ulcer disease or Mallory-Weiss tear in setting of multiple episodes of vomiting.  Given lung findings one also has to worry about spurious hematemesis from swallowed blood.  He could also have reflux esophagitis given history of chest pain although he has other reasons to have chest pain. Patient was scheduled to undergo EGD today but it was postponed because his blood glucose was 70 and he did not have IV access.  He is scheduled for procedure to be done by Dr. Abbey Chatters tomorrow.  #3.  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  Patient is being evaluated by Dr. Delton Coombes of oncology service.  #4.  Chronic respiratory failure.  Patient appears to be at baseline.  #5.  History of DVT and pulmonary embolism.  Anticoagulant on hold.  Plan  Esophagogastroduodenoscopy by Dr. Abbey Chatters on 05/09/2021 under monitored anesthesia care.

## 2021-05-08 NOTE — Progress Notes (Signed)
In the pre-op area, we have tried numerous times, including with ultrasound to obtain IV access, but were unsuccessful.  I've discussed this case with Dr. Laural Golden, and because the procedure is not emergent, we are transferring the patient back to the inpatient floor.  I would recommend a PICC line.    In addition, the patient is relatively hypoglycemic, and without IV access the preferred treatment is oral intake.  So, we will give him juice by mouth, and he will be NPO after midnight for an EGD with Dr. Abbey Chatters tomorrow.

## 2021-05-08 NOTE — Evaluation (Signed)
Physical Therapy Evaluation Patient Details Name: Roger Lowe. MRN: [id number] DOB: 03-20-1946 Today's Date: 05/08/2021   History of Present Illness  Roger Lowe  is a 75 y.o. male, with history of CAD, diabetes mellitus type 2, DVT/PE on Xarelto, hypertension, history of left-sided lung cancer status postresection, CVA, CHF with EF of 30 to 35%, AICD presents ED with a chief complaint of chest pain and hematemesis.  Patient reports that he has had chest pain on and off for 3 or 4 days.  It has been worse over the last 24 hours.  He took 1 nitro and it improved but was not resolved, 20 minutes later he took another nitro, and then the pain went away.  He has not noticed any provocative factors for his chest pain.  He describes it as a pressure on the left side, moderate intensity.  Patient reports that the pain is nonexertional.  He did not have palpitations.  He has been nauseous and throwing up.  Patient reports that his last normal meal was about 3 days ago as he has had no appetite.  He has thrown up a total of 4 times.  He reports gushes of blood in his emesis.  Patient reports he has never had that happen to him before.  He does not have abdominal pain.  Patient believes that his nausea and vomiting is a separate issue from his chest pain.  Patient also complains of productive cough with dark green sputum that is chronic.  He reports he has seen multiple specialists.  He is on prophylactic Zithromax 3 times a week.  He reports he had been on Zithromax 500 mg 3 times a week, and he felt better, but he has been reduced to 250 mg 3 times a week.  Patient's blood pressure is quite high in the ED, he reports that he has been taking all of his medications.  He does report that he has not been taking Imdur as his doctor told him to stop it after he had recurrent headaches with it.  Patient has a finding on CT suspicious for malignancy.  Patient reports that he has had lung cancer in the past.  He reports some  kind of resection in the 1990s, but he does not know any of the details.  At that time he had chemo and radiation.  Lastly patient complains of chronic constipation.  He reports sometimes he goes 2 weeks without a normal bowel movement.  He still denies abdominal pain.   Clinical Impression  Patient functioning near baseline for functional mobility and gait.  Plan:  Patient discharged from physical therapy to care of nursing for ambulation daily as tolerated for length of stay.      Follow Up Recommendations No PT follow up    Equipment Recommendations  None recommended by PT    Recommendations for Other Services       Precautions / Restrictions Precautions Precautions: None Restrictions Weight Bearing Restrictions: No      Mobility  Bed Mobility Overal bed mobility: Modified Independent                  Transfers Overall transfer level: Modified independent                  Ambulation/Gait Ambulation/Gait assistance: Modified independent (Device/Increase time) Gait Distance (Feet): 50 Feet Assistive device: None Gait Pattern/deviations: WFL(Within Functional Limits) Gait velocity: slightly decreased   General Gait Details: grossly WFL demonstrating good return for ambulating in  room while on 3 LPM O2  Stairs            Wheelchair Mobility    Modified Rankin (Stroke Patients Only)       Balance Overall balance assessment: No apparent balance deficits (not formally assessed)                                           Pertinent Vitals/Pain Pain Assessment: 0-10 Pain Score: 7  Pain Location: Chronic right shoulder, back, and neck Pain Descriptors / Indicators: Aching;Sore Pain Intervention(s): Limited activity within patient's tolerance;Monitored during session;Repositioned    Home Living Family/patient expects to be discharged to:: Private residence Living Arrangements: Other relatives Available Help at Discharge:  Family Type of Home: House Home Access: Stairs to enter Entrance Stairs-Rails: Left Entrance Stairs-Number of Steps: 2 Home Layout: One level Home Equipment: Wheelchair - power;Grab bars - tub/shower      Prior Function Level of Independence: Independent with assistive device(s)         Comments: household ambulator without AD, uses power wheelchair for longer distances     Hand Dominance   Dominant Hand: Right    Extremity/Trunk Assessment   Upper Extremity Assessment Upper Extremity Assessment: Overall WFL for tasks assessed    Lower Extremity Assessment Lower Extremity Assessment: Overall WFL for tasks assessed    Cervical / Trunk Assessment Cervical / Trunk Assessment: Normal  Communication   Communication: No difficulties  Cognition Arousal/Alertness: Awake/alert Behavior During Therapy: WFL for tasks assessed/performed Overall Cognitive Status: Within Functional Limits for tasks assessed                                        General Comments      Exercises     Assessment/Plan    PT Assessment Patent does not need any further PT services  PT Problem List         PT Treatment Interventions      PT Goals (Current goals can be found in the Care Plan section)  Acute Rehab PT Goals Patient Stated Goal: return home with family to assist PT Goal Formulation: With patient Time For Goal Achievement: 05/08/21 Potential to Achieve Goals: Good    Frequency     Barriers to discharge        Co-evaluation               AM-PAC PT "6 Clicks" Mobility  Outcome Measure Help needed turning from your back to your side while in a flat bed without using bedrails?: None Help needed moving from lying on your back to sitting on the side of a flat bed without using bedrails?: None Help needed moving to and from a bed to a chair (including a wheelchair)?: None Help needed standing up from a chair using your arms (e.g., wheelchair or bedside  chair)?: None Help needed to walk in hospital room?: None Help needed climbing 3-5 steps with a railing? : None 6 Click Score: 24    End of Session Equipment Utilized During Treatment: Oxygen Activity Tolerance: Patient tolerated treatment well Patient left: in chair;with call bell/phone within reach;with nursing/sitter in room Nurse Communication: Mobility status PT Visit Diagnosis: Unsteadiness on feet (R26.81);Other abnormalities of gait and mobility (R26.89);Muscle weakness (generalized) (M62.81)    Time: 1040-1103 PT  Time Calculation (min) (ACUTE ONLY): 23 min   Charges:   PT Evaluation $PT Eval Moderate Complexity: 1 Mod PT Treatments $Therapeutic Activity: 23-37 mins        11:38 AM, 05/08/21 Lonell Grandchild, MPT Physical Therapist with [facility] [phone] office [phone] mobile phone

## 2021-05-09 ENCOUNTER — Encounter (HOSPITAL_COMMUNITY)
Admission: EM | Disposition: A | Discharge status: Home / Self Care | Payer: Self-pay | Source: Home / Self Care | Attending: Internal Medicine

## 2021-05-09 ENCOUNTER — Encounter (HOSPITAL_COMMUNITY): Payer: Self-pay | Admitting: Internal Medicine

## 2021-05-09 ENCOUNTER — Observation Stay (HOSPITAL_COMMUNITY): Payer: Medicare HMO | Admitting: Certified Registered"

## 2021-05-09 ENCOUNTER — Ambulatory Visit (INDEPENDENT_AMBULATORY_CARE_PROVIDER_SITE_OTHER): Payer: Medicare HMO

## 2021-05-09 ENCOUNTER — Other Ambulatory Visit (HOSPITAL_COMMUNITY): Payer: Self-pay

## 2021-05-09 ENCOUNTER — Encounter (HOSPITAL_COMMUNITY): Payer: Medicare HMO

## 2021-05-09 DIAGNOSIS — C3490 Malignant neoplasm of unspecified part of unspecified bronchus or lung: Secondary | ICD-10-CM

## 2021-05-09 DIAGNOSIS — Z95810 Presence of automatic (implantable) cardiac defibrillator: Secondary | ICD-10-CM | POA: Diagnosis not present

## 2021-05-09 DIAGNOSIS — K2970 Gastritis, unspecified, without bleeding: Secondary | ICD-10-CM

## 2021-05-09 DIAGNOSIS — I5022 Chronic systolic (congestive) heart failure: Secondary | ICD-10-CM

## 2021-05-09 DIAGNOSIS — B3781 Candidal esophagitis: Secondary | ICD-10-CM

## 2021-05-09 HISTORY — PX: ESOPHAGOGASTRODUODENOSCOPY (EGD) WITH PROPOFOL: SHX5813

## 2021-05-09 HISTORY — PX: BIOPSY: SHX5522

## 2021-05-09 LAB — BASIC METABOLIC PANEL
Anion gap: 10 (ref 5–15)
BUN: 13 mg/dL (ref 8–23)
CO2: 25 mmol/L (ref 22–32)
Calcium: 9.3 mg/dL (ref 8.9–10.3)
Chloride: 101 mmol/L (ref 98–111)
Creatinine, Ser: 1.07 mg/dL (ref 0.61–1.24)
GFR, Estimated: >60 mL/min (ref >60)
Glucose, Bld: 90 mg/dL (ref 70–99)
Potassium: 4 mmol/L (ref 3.5–5.1)
Sodium: 136 mmol/L (ref 135–145)

## 2021-05-09 LAB — GLUCOSE, CAPILLARY
Glucose-Capillary: 80 mg/dL (ref 70–99)
Glucose-Capillary: 90 mg/dL (ref 70–99)
Glucose-Capillary: 94 mg/dL (ref 70–99)
Glucose-Capillary: 75 mg/dL (ref 70–99)
Glucose-Capillary: 152 mg/dL — ABNORMAL HIGH (ref 70–99)

## 2021-05-09 LAB — CBC
HCT: 44.3 % (ref 39.0–52.0)
Hemoglobin: 14 g/dL (ref 13.0–17.0)
MCH: 29.7 pg (ref 26.0–34.0)
MCHC: 31.6 g/dL (ref 30.0–36.0)
MCV: 94.1 fL (ref 80.0–100.0)
Platelets: 183 10*3/uL (ref 150–400)
RBC: 4.71 MIL/uL (ref 4.22–5.81)
RDW: 14.3 % (ref 11.5–15.5)
WBC: 7.9 10*3/uL (ref 4.0–10.5)
nRBC: 0 % (ref 0.0–0.2)

## 2021-05-09 LAB — KOH PREP: KOH Prep: NONE SEEN

## 2021-05-09 LAB — MAGNESIUM: Magnesium: 2 mg/dL (ref 1.7–2.4)

## 2021-05-09 SURGERY — ESOPHAGOGASTRODUODENOSCOPY (EGD) WITH PROPOFOL
Anesthesia: General

## 2021-05-09 MED ORDER — PROPOFOL 10 MG/ML IV BOLUS
INTRAVENOUS | Status: DC | PRN
  Administered 2021-05-09: 40 mg via INTRAVENOUS
  Administered 2021-05-09: 100 mg via INTRAVENOUS

## 2021-05-09 MED ORDER — PHENYLEPHRINE 40 MCG/ML (10ML) SYRINGE FOR IV PUSH (FOR BLOOD PRESSURE SUPPORT)
PREFILLED_SYRINGE | INTRAVENOUS | Status: DC | PRN
  Administered 2021-05-09 (×2): 80 ug via INTRAVENOUS

## 2021-05-09 MED ORDER — LACTATED RINGERS IV SOLN: INTRAVENOUS | Status: DC | PRN

## 2021-05-09 MED ORDER — PANTOPRAZOLE SODIUM 40 MG PO TBEC: 40.0000 mg | DELAYED_RELEASE_TABLET | Freq: Two times a day (BID) | ORAL | 3 refills | Status: DC

## 2021-05-09 MED ORDER — LIDOCAINE HCL (CARDIAC) PF 100 MG/5ML IV SOSY
PREFILLED_SYRINGE | INTRAVENOUS | Status: DC | PRN
Start: 2021-05-09 — End: 2021-05-09
  Administered 2021-05-09: 50 mg via INTRAVENOUS

## 2021-05-09 MED ORDER — PHENYLEPHRINE 40 MCG/ML (10ML) SYRINGE FOR IV PUSH (FOR BLOOD PRESSURE SUPPORT)
PREFILLED_SYRINGE | INTRAVENOUS | Status: AC
  Filled 2021-05-09: qty 10

## 2021-05-09 MED ORDER — STERILE WATER FOR IRRIGATION IR SOLN
Status: DC | PRN
  Administered 2021-05-09: 100 mL

## 2021-05-09 MED ORDER — LACTATED RINGERS IV SOLN: INTRAVENOUS | Status: DC

## 2021-05-09 MED ORDER — SODIUM CHLORIDE 0.9 % IV SOLN
INTRAVENOUS | Status: DC
Start: 2021-05-09 — End: 2021-05-09

## 2021-05-09 NOTE — Progress Notes (Signed)
PICC removed from RUA.  No bleeding. Length 43 cm.  Gauze with vasoline and clear dressing placed

## 2021-05-09 NOTE — Interval H&P Note (Signed)
History and Physical Interval Note:  05/09/2021 11:41 AM  Roger Lowe.  has presented today for surgery, with the diagnosis of hematemesis.  The various methods of treatment have been discussed with the patient and family. After consideration of risks, benefits and other options for treatment, the patient has consented to  Procedure(s): ESOPHAGOGASTRODUODENOSCOPY (EGD) WITH PROPOFOL (N/A) as a surgical intervention.  The patient's history has been reviewed, patient examined, no change in status, stable for surgery.  I have reviewed the patient's chart and labs.  Questions were answered to the patient's satisfaction.     Eloise Harman

## 2021-05-09 NOTE — Transfer of Care (Signed)
Immediate Anesthesia Transfer of Care Note  Patient: Roger Lowe.  Procedure(s) Performed: ESOPHAGOGASTRODUODENOSCOPY (EGD) WITH PROPOFOL BIOPSY  Patient Location: PACU  Anesthesia Type:General  Level of Consciousness: drowsy  Airway & Oxygen Therapy: Patient Spontanous Breathing and Patient connected to nasal cannula oxygen  Post-op Assessment: Report given to RN and Post -op Vital signs reviewed and stable  Post vital signs: Reviewed and stable  Last Vitals:  Vitals Value Taken Time  BP    Temp    Pulse    Resp    SpO2      Last Pain:  Vitals:   05/09/21 1150  TempSrc:   PainSc: 0-No pain         Complications: No notable events documented.

## 2021-05-09 NOTE — Progress Notes (Signed)
Nsg Discharge Note  Admit Date:  05/06/2021 Discharge date: 05/09/2021   [name] to be D/C'd Home per MD order.  AVS completed.  Copy for chart, and copy for patient signed, and dated. Patient/caregiver able to verbalize understanding.  Discharge Medication: Allergies as of 05/09/2021   No Known Allergies      Medication List     STOP taking these medications    isosorbide mononitrate 30 MG 24 hr tablet Commonly known as: IMDUR   nystatin 100000 UNIT/ML suspension Commonly known as: MYCOSTATIN   omeprazole 40 MG capsule Commonly known as: PRILOSEC   predniSONE 10 MG tablet Commonly known as: DELTASONE       TAKE these medications    albuterol 108 (90 Base) MCG/ACT inhaler Commonly known as: VENTOLIN HFA Inhale 1-2 puffs into the lungs every 6 (six) hours as needed for shortness of breath or wheezing.   allopurinol 300 MG tablet Commonly known as: ZYLOPRIM Take 300 mg by mouth daily.   amitriptyline 50 MG tablet Commonly known as: ELAVIL Take 50 mg by mouth 2 (two) times daily.   atorvastatin 40 MG tablet Commonly known as: LIPITOR 40 mg daily.   azithromycin 250 MG tablet Commonly known as: Zithromax Take 1 tablet by mouth every Monday, Wednesday and Friday   Brovana 15 MCG/2ML Nebu Generic drug: arformoterol USE 1 VIAL  IN  NEBULIZER TWICE  DAILY - Morning And Evening What changed: See the new instructions.   budesonide 0.5 MG/2ML nebulizer solution Commonly known as: PULMICORT USE 1 VIAL  IN  NEBULIZER TWICE  DAILY - Rinse Mouth After Treatment What changed: See the new instructions.   carvedilol 25 MG tablet Commonly known as: COREG TAKE 1 TABLET TWICE DAILY What changed: when to take this   ergocalciferol 1.25 MG (50000 UT) capsule Commonly known as: VITAMIN D2 Take 50,000 Units by mouth once a week. Tuesday   Farxiga 5 MG Tabs tablet Generic drug: dapagliflozin propanediol Take 5 mg by mouth daily.   furosemide 40 MG tablet Commonly  known as: LASIX Take 40 mg by mouth daily.   gabapentin 100 MG capsule Commonly known as: NEURONTIN Take 100 mg by mouth 3 (three) times daily.   hydrALAZINE 50 MG tablet Commonly known as: APRESOLINE Take 1 tablet (50 mg total) by mouth 3 (three) times daily.   HYDROcodone-acetaminophen 10-325 MG tablet Commonly known as: NORCO Take 1 tablet by mouth every 6 (six) hours as needed for moderate pain.   insulin detemir 100 UNIT/ML FlexPen Commonly known as: Levemir FlexTouch Inject 45 Units into the skin at bedtime.   ipratropium-albuterol 0.5-2.5 (3) MG/3ML Soln Commonly known as: DUONEB USE 1 VIAL IN NEBULIZER EVERY 6 HOURS - And As Needed (For Rescue -MAX 30 DOSES PER MONTH) What changed:  how much to take how to take this when to take this reasons to take this additional instructions   latanoprost 0.005 % ophthalmic solution Commonly known as: XALATAN Place 1 drop into both eyes daily.   methocarbamol 500 MG tablet Commonly known as: ROBAXIN Take 500 mg by mouth every 8 (eight) hours as needed for muscle spasms.   nitroGLYCERIN 0.4 MG SL tablet Commonly known as: NITROSTAT PLACE 1 TABLET UNDER THE TONGUE EVERY 5 (FIVE) MINUTES AS NEEDED FOR CHEST PAIN  AS DIRECTED What changed: See the new instructions.   NovoLOG FlexPen 100 UNIT/ML FlexPen Generic drug: insulin aspart Inject 25-40 Units into the skin in the morning, at noon, and at bedtime. Sliding scale  OVER THE COUNTER MEDICATION Compression vest   OXYGEN Inhale 2-4 L into the lungs continuous.   pantoprazole 40 MG tablet Commonly known as: Protonix Take 1 tablet (40 mg total) by mouth 2 (two) times daily.   ReliOn Pen Needles 31G X 6 MM Misc Generic drug: Insulin Pen Needle USE 1 PEN NEEDLE 4 TIMES DAILY   rivaroxaban 20 MG Tabs tablet Commonly known as: XARELTO Take 1 tablet (20 mg total) by mouth every morning. What changed: when to take this   Spiriva Respimat 2.5 MCG/ACT Aers Generic drug:  Tiotropium Bromide Monohydrate Inhale 2 puffs into the lungs daily.   spironolactone 25 MG tablet Commonly known as: ALDACTONE TAKE 1 TABLET (25 MG TOTAL) BY MOUTH DAILY.        Discharge Assessment: Vitals:   05/09/21 1215 05/09/21 1245  BP: 121/72 129/73  Pulse: 97 90  Resp: 20 20  Temp:  98.4 F (36.9 C)  SpO2: 95% 100%   Skin clean, dry and intact without evidence of skin break down, no evidence of skin tears noted. IV catheter discontinued intact. Site without signs and symptoms of complications - no redness or edema noted at insertion site, patient denies c/o pain - only slight tenderness at site.  Dressing with slight pressure applied.  D/c Instructions-Education: Discharge instructions given to patient/family with verbalized understanding. D/c education completed with patient/family including follow up instructions, medication list, d/c activities limitations if indicated, with other d/c instructions as indicated by MD - patient able to verbalize understanding, all questions fully answered. Patient instructed to return to ED, call 911, or call MD for any changes in condition.  Patient escorted via [place], and D/C home via private auto.  Clovis Fredrickson, LPN 02/07/4502 8:88 PM

## 2021-05-09 NOTE — Anesthesia Postprocedure Evaluation (Signed)
Anesthesia Post Note  Patient: Roger Lowe.  Procedure(s) Performed: ESOPHAGOGASTRODUODENOSCOPY (EGD) WITH PROPOFOL BIOPSY  Patient location during evaluation: Phase II Anesthesia Type: General Level of consciousness: awake Pain management: pain level controlled Vital Signs Assessment: post-procedure vital signs reviewed and stable Respiratory status: spontaneous breathing and respiratory function stable Cardiovascular status: blood pressure returned to baseline and stable Postop Assessment: no headache and no apparent nausea or vomiting Anesthetic complications: no Comments: Late entry   No notable events documented.   Last Vitals:  Vitals:   05/09/21 1215 05/09/21 1245  BP: 121/72 129/73  Pulse: 97 90  Resp: 20 20  Temp:  36.9 C  SpO2: 95% 100%    Last Pain:  Vitals:   05/09/21 1245  TempSrc: Oral  PainSc:                  Louann Sjogren

## 2021-05-09 NOTE — Op Note (Signed)
[facility] Patient Name: Roger Lowe Procedure Date: 05/09/2021 11:52 AM MRN: [id number] Date of Birth: 1946/02/16 Attending MD: Elon Alas. Abbey Chatters DO CSN: [id number] Age: 75 Admit Type: Outpatient Procedure:                Upper GI endoscopy Indications:              Hematemesis Providers:                Elon Alas. Abbey Chatters, DO, Caprice Kluver, Aram Candela Referring MD:              Medicines:                See the Anesthesia note for documentation of the                            administered medications Complications:            No immediate complications. Estimated Blood Loss:     Estimated blood loss was minimal. Procedure:                Pre-Anesthesia Assessment:                           - The anesthesia plan was to use monitored                            anesthesia care (MAC).                           After obtaining informed consent, the endoscope was                            passed under direct vision. Throughout the                            procedure, the patient's blood pressure, pulse, and                            oxygen saturations were monitored continuously. The                            [id number]-[id number] ([id number]) scope was introduced through the                            mouth, and advanced to the second part of duodenum.                            The upper GI endoscopy was accomplished without                            difficulty. The patient tolerated the procedure                            well. Scope In: 11:53:35 AM Scope Out: 11:58:46 AM Total Procedure Duration: 0 hours 5 minutes 11 seconds  Findings:      Non-severe esophagitis with no bleeding was found in the entire  esophagus. Cells for cytology were obtained by brushing.      Diffuse moderate inflammation characterized by erythema was found in the       entire examined stomach. Biopsies were taken with a cold forceps for       Helicobacter pylori testing.      The duodenal bulb, first  portion of the duodenum and second portion of       the duodenum were normal. Impression:               - Non-severe candidiasis esophagitis with no                            bleeding. Cells for cytology obtained.                           - Gastritis. Biopsied.                           - Normal duodenal bulb, first portion of the                            duodenum and second portion of the duodenum. Moderate Sedation:      Per Anesthesia Care Recommendation:           - Return patient to hospital ward for ongoing care.                           - Candidal esophagitis versus food debris found in                            the esophagus. Cytology taken with brushings.                            Diffuse gastritis noted on exam as well. Biopsies                            taken to rule out H. pylori. Continue on twice                            daily PPI for 8 to 12 weeks. Okay to switch to p.o.                            No active or stigmata of bleeding identified on                            today's exam. Okay to resume Xarelto tomorrow. Okay                            to place on regular diet. Treat for candida if                            cytology positive. GI to continue to follow. Procedure Code(s):        --- Professional ---  79024, Esophagogastroduodenoscopy, flexible,                            transoral; with biopsy, single or multiple Diagnosis Code(s):        --- Professional ---                           B37.81, Candidal esophagitis                           K29.70, Gastritis, unspecified, without bleeding                           K92.0, Hematemesis CPT copyright 2019 [employer]. All rights reserved. The codes documented in this report are preliminary and upon coder review may  be revised to meet current compliance requirements. Elon Alas. Abbey Chatters, DO [place] Abbey Chatters, DO 05/09/2021 12:05:57 PM This report has been signed  electronically. Number of Addenda: 0

## 2021-05-09 NOTE — Discharge Summary (Signed)
Physician Discharge Summary  Roger Lowe. BMW:[id number] DOB: 1946-05-19 DOA: 05/06/2021  PCP: [name], MD  Admit date: 05/06/2021  Discharge date: 05/09/2021  Admitted From:Home  Disposition:  Home  Recommendations for Outpatient Follow-up:  Follow up with PCP in 1-2 weeks GI to follow-up with cytology and treat for Candida if positive Continue on PPI twice daily as prescribed for 12 weeks per GI recommendations Okay to resume Xarelto 9/9 Continue other home medications as prior  Home Health: None  Equipment/Devices: Has home 2 L nasal cannula oxygen  Discharge Condition:Stable  CODE STATUS: DNR  Diet recommendation: Heart Healthy/carb modified  Brief/Interim Summary:  Roger Lowe  is a 75 y.o. male, with history of CAD, diabetes mellitus type 2, DVT/PE on Xarelto, hypertension, history of left-sided lung cancer status postresection, CVA, CHF with EF of 30 to 35%, AICD presents ED with a chief complaint of chest pain and hematemesis.  He has undergone EGD on 9/8 with no acute bleed noted, but he does have diffuse gastritis as well as questionable Candida esophagitis versus food debris.  Cytology brushings have been sampled from EGD with results still pending and GI plans to follow-up for treatment if positive for Candida.  In the meantime he is recommended to remain on PPI twice daily and may resume Xarelto starting 9/9.  He was also noted to have a cavitary left lower lung nodule concerning for bronchogenic neoplasm and has been seen by oncology with plans to follow-up in the next 1-2 weeks for PET/CT as well as outpatient referral to pulmonology for navigational bronchoscopy.  No other acute events noted throughout the course of the stay and he has not had any further episodes of hematemesis.  Hemoglobin has remained stable.  Discharge Diagnoses:  Active Problems:   Chest pain   Chronic hypoxemic respiratory failure (HCC)   Hyperlipidemia   Congestive heart failure (HCC)    Abnormal CT scan of lung   Essential hypertension   Type 2 diabetes mellitus (HCC)   Hematemesis   Nodule of lower lobe of left lung   Chronic pulmonary embolism (HCC)  Principal discharge diagnosis: Hematemesis with diffuse gastritis and possible Candida esophagitis.  New finding of cavitary left lower lung nodule concerning for bronchogenic neoplasm.  Discharge Instructions  Discharge Instructions     Ambulatory referral to Gastroenterology   Complete by: As directed    Diet - low sodium heart healthy   Complete by: As directed    Increase activity slowly   Complete by: As directed       Allergies as of 05/09/2021   No Known Allergies      Medication List     STOP taking these medications    isosorbide mononitrate 30 MG 24 hr tablet Commonly known as: IMDUR   nystatin 100000 UNIT/ML suspension Commonly known as: MYCOSTATIN   omeprazole 40 MG capsule Commonly known as: PRILOSEC   predniSONE 10 MG tablet Commonly known as: DELTASONE       TAKE these medications    albuterol 108 (90 Base) MCG/ACT inhaler Commonly known as: VENTOLIN HFA Inhale 1-2 puffs into the lungs every 6 (six) hours as needed for shortness of breath or wheezing.   allopurinol 300 MG tablet Commonly known as: ZYLOPRIM Take 300 mg by mouth daily.   amitriptyline 50 MG tablet Commonly known as: ELAVIL Take 50 mg by mouth 2 (two) times daily.   atorvastatin 40 MG tablet Commonly known as: LIPITOR 40 mg daily.   azithromycin 250  MG tablet Commonly known as: Zithromax Take 1 tablet by mouth every Monday, Wednesday and Friday   Brovana 15 MCG/2ML Nebu Generic drug: arformoterol USE 1 VIAL  IN  NEBULIZER TWICE  DAILY - Morning And Evening What changed: See the new instructions.   budesonide 0.5 MG/2ML nebulizer solution Commonly known as: PULMICORT USE 1 VIAL  IN  NEBULIZER TWICE  DAILY - Rinse Mouth After Treatment What changed: See the new instructions.   carvedilol 25 MG  tablet Commonly known as: COREG TAKE 1 TABLET TWICE DAILY What changed: when to take this   ergocalciferol 1.25 MG (50000 UT) capsule Commonly known as: VITAMIN D2 Take 50,000 Units by mouth once a week. Tuesday   Farxiga 5 MG Tabs tablet Generic drug: dapagliflozin propanediol Take 5 mg by mouth daily.   furosemide 40 MG tablet Commonly known as: LASIX Take 40 mg by mouth daily.   gabapentin 100 MG capsule Commonly known as: NEURONTIN Take 100 mg by mouth 3 (three) times daily.   hydrALAZINE 50 MG tablet Commonly known as: APRESOLINE Take 1 tablet (50 mg total) by mouth 3 (three) times daily.   HYDROcodone-acetaminophen 10-325 MG tablet Commonly known as: NORCO Take 1 tablet by mouth every 6 (six) hours as needed for moderate pain.   insulin detemir 100 UNIT/ML FlexPen Commonly known as: Levemir FlexTouch Inject 45 Units into the skin at bedtime.   ipratropium-albuterol 0.5-2.5 (3) MG/3ML Soln Commonly known as: DUONEB USE 1 VIAL IN NEBULIZER EVERY 6 HOURS - And As Needed (For Rescue -MAX 30 DOSES PER MONTH) What changed:  how much to take how to take this when to take this reasons to take this additional instructions   latanoprost 0.005 % ophthalmic solution Commonly known as: XALATAN Place 1 drop into both eyes daily.   methocarbamol 500 MG tablet Commonly known as: ROBAXIN Take 500 mg by mouth every 8 (eight) hours as needed for muscle spasms.   nitroGLYCERIN 0.4 MG SL tablet Commonly known as: NITROSTAT PLACE 1 TABLET UNDER THE TONGUE EVERY 5 (FIVE) MINUTES AS NEEDED FOR CHEST PAIN  AS DIRECTED What changed: See the new instructions.   NovoLOG FlexPen 100 UNIT/ML FlexPen Generic drug: insulin aspart Inject 25-40 Units into the skin in the morning, at noon, and at bedtime. Sliding scale   OVER THE COUNTER MEDICATION Compression vest   OXYGEN Inhale 2-4 L into the lungs continuous.   pantoprazole 40 MG tablet Commonly known as: Protonix Take 1  tablet (40 mg total) by mouth 2 (two) times daily.   ReliOn Pen Needles 31G X 6 MM Misc Generic drug: Insulin Pen Needle USE 1 PEN NEEDLE 4 TIMES DAILY   rivaroxaban 20 MG Tabs tablet Commonly known as: XARELTO Take 1 tablet (20 mg total) by mouth every morning. What changed: when to take this   Spiriva Respimat 2.5 MCG/ACT Aers Generic drug: Tiotropium Bromide Monohydrate Inhale 2 puffs into the lungs daily.   spironolactone 25 MG tablet Commonly known as: ALDACTONE TAKE 1 TABLET (25 MG TOTAL) BY MOUTH DAILY.        Follow-up Information     [name], MD. Schedule an appointment as soon as possible for a visit in 1 week(s).   Specialty: Internal Medicine Contact information: [place] [address] [phone]         [place]. Go to.   Contact information: [address] [place] [phone]  No Known Allergies  Consultations: GI Oncology   Procedures/Studies: DG Chest 2 View  Result Date: 05/06/2021 CLINICAL DATA:  Chest pain. Worsening cough for 2 weeks. Chronic cough. EXAM: CHEST - 2 VIEW COMPARISON:  Prior chest radiographs 01/01/2021 and earlier. FINDINGS: Left chest multi lead implantable cardiac device. Heart size at the upper limits of normal. Mild ill-defined opacity within the left lung base. The right lung is clear. No evidence of pleural effusion or pneumothorax. No acute bony abnormality identified. Degenerative changes of the spine. IMPRESSION: Mild ill-defined opacity within the left lung base, which may reflect atelectasis. Pneumonia is difficult to definitively exclude. Clinical correlation is recommended. Additionally, consider short-interval radiographic follow-up. Electronically Signed   By: Kellie Simmering D.O.   On: 05/06/2021 20:35   CT Chest W Contrast  Result Date: 05/06/2021 CLINICAL DATA:  Chest pain, hematemesis, gastrointestinal hemorrhage  EXAM: CT CHEST, ABDOMEN, AND PELVIS WITH CONTRAST TECHNIQUE: Multidetector CT imaging of the chest, abdomen and pelvis was performed following the standard protocol during bolus administration of intravenous contrast. CONTRAST:  63mL OMNIPAQUE IOHEXOL 350 MG/ML SOLN COMPARISON:  CT chest 08/28/2020, 10/11/2019 FINDINGS: CT CHEST FINDINGS Cardiovascular: No significant coronary artery calcification. Global cardiac size within normal limits. Left subclavian pacemaker is in place with leads within the right atrium, right ventricle toward the apex, and left ventricular venous outflow. No pericardial effusion. The central pulmonary arteries are of normal caliber. There is mild dilation of the descending thoracic aorta, measuring 3.1 cm in diameter at the level of the aortic isthmus. Distally, the descending thoracic aorta measures 2.8 cm in diameter at the level of the left atrium. The ascending aorta is not dilated. Mediastinum/Nodes: A single enlarged aortopulmonary window lymph node is seen measuring 14 mm in short axis diameter, axial image # 22/2, nonspecific. This appears enlarged since prior examination. No additional pathologic thoracic adenopathy. Visualized thyroid is unremarkable. Esophagus is unremarkable. Lungs/Pleura: Mild centrilobular emphysema. There is extensive bibasilar cylindrical bronchiectasis, more severe within the left lower lobe with associated consolidation and volume loss. There is superimposed peribronchial nodularity within the left lower lobe and basilar lingula which is stable from prior examination and likely represents post inflammatory scarring. A enlarging cavitary nodule is seen within the left lower lobe measuring 13 mm x 15 mm at axial image # 83/7, previously measuring 8 mm x 12 mm and new from remote prior examination of 05/23/2019. An enlarging primary bronchogenic neoplasm is favored. No pneumothorax or pleural effusion.  No central obstructing lesion. Musculoskeletal: No acute  bone abnormality. No lytic or blastic bone lesion. CT ABDOMEN PELVIS FINDINGS Hepatobiliary: No focal liver abnormality is seen. No gallstones, gallbladder wall thickening, or biliary dilatation. Pancreas: Unremarkable Spleen: Unremarkable Adrenals/Urinary Tract: The adrenal glands are unremarkable. The kidneys are normal in size and position. Multiple simple cortical cysts are seen within the left kidney. The kidneys are otherwise unremarkable. The bladder is unremarkable. Stomach/Bowel: Stomach is within normal limits. Appendix appears normal. No evidence of bowel wall thickening, distention, or inflammatory changes. No free intraperitoneal gas or fluid. Vascular/Lymphatic: Moderate aortoiliac atherosclerotic calcification. No aortic aneurysm. No pathologic adenopathy within the abdomen and pelvis. Reproductive: Marked prostatic enlargement noted. Other: Tiny broad-based fat containing umbilical hernia. Rectum is unremarkable. Musculoskeletal: No acute bone abnormality. No lytic or blastic bone lesion. Degenerative changes are seen within the lumbar spine. IMPRESSION: Mean 14 mm cavitary nodule within the a left lower lobe demonstrating progressive enlargement since remote prior examinations suspicious for a primary bronchogenic neoplasm. Consider one  of the following in 3 months for both low-risk and high-risk individuals: (a) repeat chest CT, (b) follow-up PET-CT, or (c) tissue sampling. This recommendation follows the consensus statement: Guidelines for Management of Incidental Pulmonary Nodules Detected on CT Images: From the Fleischner Society 2017; Radiology 2017; 284:228-243. Mild emphysema. Bibasilar bronchiectasis and stable peribronchial nodularity, likely post inflammatory in nature. 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 Recommend annual imaging followup by CTA or MRA. This recommendation follows 2010 ACCF/AHA/AATS/ACR/ASA/SCA/SCAI/SIR/STS/SVM Guidelines for the Diagnosis and  Management of Patients with Thoracic Aortic Disease. Circulation.2010; 121: O841-Y606. Aortic aneurysm NOS (ICD10-I71.9) No acute intra-abdominal pathology. Aortic Atherosclerosis (ICD10-I70.0) and Emphysema (ICD10-J43.9). Electronically Signed   By: Fidela Salisbury M.D.   On: 05/06/2021 23:45   CT ABDOMEN PELVIS W CONTRAST  Result Date: 05/06/2021 CLINICAL DATA:  Chest pain, hematemesis, gastrointestinal hemorrhage EXAM: CT CHEST, ABDOMEN, AND PELVIS WITH CONTRAST TECHNIQUE: Multidetector CT imaging of the chest, abdomen and pelvis was performed following the standard protocol during bolus administration of intravenous contrast. CONTRAST:  62mL OMNIPAQUE IOHEXOL 350 MG/ML SOLN COMPARISON:  CT chest 08/28/2020, 10/11/2019 FINDINGS: CT CHEST FINDINGS Cardiovascular: No significant coronary artery calcification. Global cardiac size within normal limits. Left subclavian pacemaker is in place with leads within the right atrium, right ventricle toward the apex, and left ventricular venous outflow. No pericardial effusion. The central pulmonary arteries are of normal caliber. There is mild dilation of the descending thoracic aorta, measuring 3.1 cm in diameter at the level of the aortic isthmus. Distally, the descending thoracic aorta measures 2.8 cm in diameter at the level of the left atrium. The ascending aorta is not dilated. Mediastinum/Nodes: A single enlarged aortopulmonary window lymph node is seen measuring 14 mm in short axis diameter, axial image # 22/2, nonspecific. This appears enlarged since prior examination. No additional pathologic thoracic adenopathy. Visualized thyroid is unremarkable. Esophagus is unremarkable. Lungs/Pleura: Mild centrilobular emphysema. There is extensive bibasilar cylindrical bronchiectasis, more severe within the left lower lobe with associated consolidation and volume loss. There is superimposed peribronchial nodularity within the left lower lobe and basilar lingula which is  stable from prior examination and likely represents post inflammatory scarring. A enlarging cavitary nodule is seen within the left lower lobe measuring 13 mm x 15 mm at axial image # 83/7, previously measuring 8 mm x 12 mm and new from remote prior examination of 05/23/2019. An enlarging primary bronchogenic neoplasm is favored. No pneumothorax or pleural effusion.  No central obstructing lesion. Musculoskeletal: No acute bone abnormality. No lytic or blastic bone lesion. CT ABDOMEN PELVIS FINDINGS Hepatobiliary: No focal liver abnormality is seen. No gallstones, gallbladder wall thickening, or biliary dilatation. Pancreas: Unremarkable Spleen: Unremarkable Adrenals/Urinary Tract: The adrenal glands are unremarkable. The kidneys are normal in size and position. Multiple simple cortical cysts are seen within the left kidney. The kidneys are otherwise unremarkable. The bladder is unremarkable. Stomach/Bowel: Stomach is within normal limits. Appendix appears normal. No evidence of bowel wall thickening, distention, or inflammatory changes. No free intraperitoneal gas or fluid. Vascular/Lymphatic: Moderate aortoiliac atherosclerotic calcification. No aortic aneurysm. No pathologic adenopathy within the abdomen and pelvis. Reproductive: Marked prostatic enlargement noted. Other: Tiny broad-based fat containing umbilical hernia. Rectum is unremarkable. Musculoskeletal: No acute bone abnormality. No lytic or blastic bone lesion. Degenerative changes are seen within the lumbar spine. IMPRESSION: Mean 14 mm cavitary nodule within the a left lower lobe demonstrating progressive enlargement since remote prior examinations suspicious for a primary bronchogenic neoplasm. Consider one of the following in  3 months for both low-risk and high-risk individuals: (a) repeat chest CT, (b) follow-up PET-CT, or (c) tissue sampling. This recommendation follows the consensus statement: Guidelines for Management of Incidental Pulmonary  Nodules Detected on CT Images: From the Fleischner Society 2017; Radiology 2017; 284:228-243. Mild emphysema. Bibasilar bronchiectasis and stable peribronchial nodularity, likely post inflammatory in nature. 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 Recommend annual imaging followup by CTA or MRA. This recommendation follows 2010 ACCF/AHA/AATS/ACR/ASA/SCA/SCAI/SIR/STS/SVM Guidelines for the Diagnosis and Management of Patients with Thoracic Aortic Disease. Circulation.2010; 121: L935-T017. Aortic aneurysm NOS (ICD10-I71.9) No acute intra-abdominal pathology. Aortic Atherosclerosis (ICD10-I70.0) and Emphysema (ICD10-J43.9). Electronically Signed   By: Fidela Salisbury M.D.   On: 05/06/2021 23:45   DG Shoulder Left  Result Date: 05/06/2021 CLINICAL DATA:  Fall EXAM: LEFT SHOULDER - 2+ VIEW COMPARISON:  None. FINDINGS: Degenerative changes in the [facility] joint with joint space narrowing and spurring. Glenohumeral joint is maintained. No acute bony abnormality. Specifically, no fracture, subluxation, or dislocation. Soft tissues are intact. 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 No acute bony abnormality. Electronically Signed   By: Rolm Baptise M.D.   On: 05/06/2021 22:31   CUP PACEART REMOTE DEVICE CHECK  Result Date: 05/08/2021 Scheduled remote reviewed. Normal device function.  Next remote 91 days. LR  DG Hip Unilat W or Wo Pelvis 2-3 Views Left  Result Date: 05/06/2021 CLINICAL DATA:  Fall, left hip pain EXAM: DG HIP (WITH OR WITHOUT PELVIS) 2-3V LEFT COMPARISON:  None FINDINGS: Mild symmetric degenerative changes in the hips with joint space narrowing and spurring. No acute bony abnormality. Specifically, no fracture, subluxation, or dislocation. IMPRESSION: No acute bony abnormality. Electronically Signed   By: Rolm Baptise M.D.   On: 05/06/2021 22:29   [place] EKG SITE RITE  Result Date: 05/08/2021 If Site Rite image not attached, placement could not be confirmed due to current  cardiac rhythm.    Discharge Exam: Vitals:   05/09/21 1205 05/09/21 1215  BP: 107/67 121/72  Pulse: 99 97  Resp: 15 20  Temp: 98.4 F (36.9 C)   SpO2: 95% 95%   Vitals:   05/09/21 0850 05/09/21 1108 05/09/21 1205 05/09/21 1215  BP: (!) 160/89 (!) 153/81 107/67 121/72  Pulse:  100 99 97  Resp:  15 15 20   Temp:  97.7 F (36.5 C) 98.4 F (36.9 C)   TempSrc:  Oral    SpO2:  97% 95% 95%  Weight:      Height:        General: Pt is alert, awake, not in acute distress Cardiovascular: RRR, S1/S2 +, no rubs, no gallops Respiratory: CTA bilaterally, no wheezing, no rhonchi, currently on 2 L nasal cannula Abdominal: Soft, NT, ND, bowel sounds + Extremities: no edema, no cyanosis    The results of significant diagnostics from this hospitalization (including imaging, microbiology, ancillary and laboratory) are listed below for reference.     Microbiology: Recent Results (from the past 240 hour(s))  Resp Panel by RT-PCR (Flu A&B, Covid) Nasopharyngeal Swab     Status: None   Collection Time: 05/07/21 12:14 AM   Specimen: Nasopharyngeal Swab; Nasopharyngeal(NP) swabs in vial transport medium  Result Value Ref Range Status   SARS Coronavirus 2 by RT PCR NEGATIVE NEGATIVE Final    Comment: (NOTE) SARS-CoV-2 target nucleic acids are NOT DETECTED.  The SARS-CoV-2 RNA is generally detectable in upper respiratory specimens during the acute phase of infection. The lowest concentration of SARS-CoV-2 viral copies this  assay can detect is 138 copies/mL. A negative result does not preclude SARS-Cov-2 infection and should not be used as the sole basis for treatment or other patient management decisions. A negative result may occur with  improper specimen collection/handling, submission of specimen other than nasopharyngeal swab, presence of viral mutation(s) within the areas targeted by this assay, and inadequate number of viral copies(<138 copies/mL). A negative result must be combined  with clinical observations, patient history, and epidemiological information. The expected result is Negative.  Fact Sheet for Patients:  EntrepreneurPulse.com.au  Fact Sheet for Healthcare Providers:  IncredibleEmployment.be  This test is no t yet approved or cleared by the [place] FDA and  has been authorized for detection and/or diagnosis of SARS-CoV-2 by FDA under an Emergency Use Authorization (EUA). This EUA will remain  in effect (meaning this test can be used) for the duration of the COVID-19 declaration under Section 564(b)(1) of the Act, 21 U.S.C.section 360bbb-3(b)(1), unless the authorization is terminated  or revoked sooner.       Influenza A by PCR NEGATIVE NEGATIVE Final   Influenza B by PCR NEGATIVE NEGATIVE Final    Comment: (NOTE) The Xpert Xpress SARS-CoV-2/FLU/RSV plus assay is intended as an aid in the diagnosis of influenza from Nasopharyngeal swab specimens and should not be used as a sole basis for treatment. Nasal washings and aspirates are unacceptable for Xpert Xpress SARS-CoV-2/FLU/RSV testing.  Fact Sheet for Patients: EntrepreneurPulse.com.au  Fact Sheet for Healthcare Providers: IncredibleEmployment.be  This test is not yet approved or cleared by the [place] FDA and has been authorized for detection and/or diagnosis of SARS-CoV-2 by FDA under an Emergency Use Authorization (EUA). This EUA will remain in effect (meaning this test can be used) for the duration of the COVID-19 declaration under Section 564(b)(1) of the Act, 21 U.S.C. section 360bbb-3(b)(1), unless the authorization is terminated or revoked.  Performed at [facility], [address], [place], [place] [address]   MRSA Next Gen by PCR, Nasal     Status: None   Collection Time: 05/07/21  6:03 PM   Specimen: Nasal Mucosa; Nasal Swab  Result Value Ref Range Status   MRSA by PCR Next Gen NOT DETECTED  NOT DETECTED Final    Comment: (NOTE) The GeneXpert MRSA Assay (FDA approved for NASAL specimens only), is one component of a comprehensive MRSA colonization surveillance program. It is not intended to diagnose MRSA infection nor to guide or monitor treatment for MRSA infections. Test performance is not FDA approved in patients less than 6 years old. Performed at [facility], [address] [place], [place] [address]      Labs: BNP (last 3 results) Recent Labs    06/01/20 1216  BNP 70.9   Basic Metabolic Panel: Recent Labs  Lab 05/06/21 1952 05/07/21 0253 05/08/21 0528 05/09/21 0550  NA 137 137 139 136  K 3.8 4.6 4.0 4.0  CL 103 104 105 101  CO2 28 25 27 25   GLUCOSE 110* 105* 74 90  BUN 15 15 13 13   CREATININE 1.33* 1.17 1.06 1.07  CALCIUM 9.1 9.4 9.0 9.3  MG  --   --   --  2.0   Liver Function Tests: Recent Labs  Lab 05/06/21 2144  AST 20  ALT 20  ALKPHOS 82  BILITOT 0.6  PROT 7.1  ALBUMIN 3.6   Recent Labs  Lab 05/06/21 2144  LIPASE 21   No results for input(s): AMMONIA in the last 168 hours. CBC: Recent Labs  Lab 05/06/21 1952 05/07/21 0253 05/08/21 0528 05/09/21 0550  WBC 7.5 7.9 7.9 7.9  HGB 15.7 15.4 13.5 14.0  HCT 49.7 48.1 43.4 44.3  MCV 95.0 94.3 95.2 94.1  PLT 177 186 162 183   Cardiac Enzymes: No results for input(s): CKTOTAL, CKMB, CKMBINDEX, TROPONINI in the last 168 hours. BNP: Invalid input(s): POCBNP CBG: Recent Labs  Lab 05/08/21 2050 05/09/21 0313 05/09/21 0732 05/09/21 1116 05/09/21 1222  GLUCAP 65* 80 90 94 75   D-Dimer No results for input(s): DDIMER in the last 72 hours. Hgb A1c Recent Labs    05/07/21 0253  HGBA1C 7.8*   Lipid Profile No results for input(s): CHOL, HDL, LDLCALC, TRIG, CHOLHDL, LDLDIRECT in the last 72 hours. Thyroid function studies No results for input(s): TSH, T4TOTAL, T3FREE, THYROIDAB in the last 72 hours.  Invalid input(s): FREET3 Anemia work up No results for input(s):  VITAMINB12, FOLATE, FERRITIN, TIBC, IRON, RETICCTPCT in the last 72 hours. Urinalysis    Component Value Date/Time   COLORURINE YELLOW 02/01/2019 1348   APPEARANCEUR Clear 06/26/2020 1017   LABSPEC 1.016 02/01/2019 1348   PHURINE 6.0 02/01/2019 1348   GLUCOSEU Negative 06/26/2020 1017   HGBUR NEGATIVE 02/01/2019 1348   BILIRUBINUR Negative 06/26/2020 1017   KETONESUR NEGATIVE 02/01/2019 1348   PROTEINUR 1+ (A) 06/26/2020 1017   PROTEINUR NEGATIVE 02/01/2019 1348   UROBILINOGEN 0.2 12/20/2019 1356   NITRITE Negative 06/26/2020 1017   NITRITE NEGATIVE 02/01/2019 1348   LEUKOCYTESUR Negative 06/26/2020 1017   LEUKOCYTESUR NEGATIVE 02/01/2019 1348   Sepsis Labs Invalid input(s): PROCALCITONIN,  WBC,  LACTICIDVEN Microbiology Recent Results (from the past 240 hour(s))  Resp Panel by RT-PCR (Flu A&B, Covid) Nasopharyngeal Swab     Status: None   Collection Time: 05/07/21 12:14 AM   Specimen: Nasopharyngeal Swab; Nasopharyngeal(NP) swabs in vial transport medium  Result Value Ref Range Status   SARS Coronavirus 2 by RT PCR NEGATIVE NEGATIVE Final    Comment: (NOTE) SARS-CoV-2 target nucleic acids are NOT DETECTED.  The SARS-CoV-2 RNA is generally detectable in upper respiratory specimens during the acute phase of infection. The lowest concentration of SARS-CoV-2 viral copies this assay can detect is 138 copies/mL. A negative result does not preclude SARS-Cov-2 infection and should not be used as the sole basis for treatment or other patient management decisions. A negative result may occur with  improper specimen collection/handling, submission of specimen other than nasopharyngeal swab, presence of viral mutation(s) within the areas targeted by this assay, and inadequate number of viral copies(<138 copies/mL). A negative result must be combined with clinical observations, patient history, and epidemiological information. The expected result is Negative.  Fact Sheet for  Patients:  EntrepreneurPulse.com.au  Fact Sheet for Healthcare Providers:  IncredibleEmployment.be  This test is no t yet approved or cleared by the [place] FDA and  has been authorized for detection and/or diagnosis of SARS-CoV-2 by FDA under an Emergency Use Authorization (EUA). This EUA will remain  in effect (meaning this test can be used) for the duration of the COVID-19 declaration under Section 564(b)(1) of the Act, 21 U.S.C.section 360bbb-3(b)(1), unless the authorization is terminated  or revoked sooner.       Influenza A by PCR NEGATIVE NEGATIVE Final   Influenza B by PCR NEGATIVE NEGATIVE Final    Comment: (NOTE) The Xpert Xpress SARS-CoV-2/FLU/RSV plus assay is intended as an aid in the diagnosis of influenza from Nasopharyngeal swab specimens and should not be used as a sole basis for treatment.  Nasal washings and aspirates are unacceptable for Xpert Xpress SARS-CoV-2/FLU/RSV testing.  Fact Sheet for Patients: EntrepreneurPulse.com.au  Fact Sheet for Healthcare Providers: IncredibleEmployment.be  This test is not yet approved or cleared by the [place] FDA and has been authorized for detection and/or diagnosis of SARS-CoV-2 by FDA under an Emergency Use Authorization (EUA). This EUA will remain in effect (meaning this test can be used) for the duration of the COVID-19 declaration under Section 564(b)(1) of the Act, 21 U.S.C. section 360bbb-3(b)(1), unless the authorization is terminated or revoked.  Performed at [facility], [address] [place], [place] [address]   MRSA Next Gen by PCR, Nasal     Status: None   Collection Time: 05/07/21  6:03 PM   Specimen: Nasal Mucosa; Nasal Swab  Result Value Ref Range Status   MRSA by PCR Next Gen NOT DETECTED NOT DETECTED Final    Comment: (NOTE) The GeneXpert MRSA Assay (FDA approved for NASAL specimens only), is one component of a  comprehensive MRSA colonization surveillance program. It is not intended to diagnose MRSA infection nor to guide or monitor treatment for MRSA infections. Test performance is not FDA approved in patients less than 57 years old. Performed at [facility], [address], [place], [place] [address]      Time coordinating discharge: 35 minutes  SIGNED:   Rodena Goldmann, DO [facility] 05/09/2021, 12:30 PM  If 7PM-7AM, please contact night-coverage [email or web address]

## 2021-05-09 NOTE — Progress Notes (Signed)
PET scan ordered per Dr. Delton Coombes verbal order.

## 2021-05-10 ENCOUNTER — Telehealth: Payer: Self-pay

## 2021-05-10 ENCOUNTER — Encounter (HOSPITAL_COMMUNITY): Payer: Self-pay | Admitting: Internal Medicine

## 2021-05-10 DIAGNOSIS — J449 Chronic obstructive pulmonary disease, unspecified: Secondary | ICD-10-CM | POA: Diagnosis not present

## 2021-05-10 LAB — SURGICAL PATHOLOGY

## 2021-05-10 NOTE — Telephone Encounter (Signed)
Remote ICM transmission received.  Attempted call to patient regarding ICM remote transmission and left message per DPR to return call.   

## 2021-05-10 NOTE — Progress Notes (Signed)
EPIC Encounter for ICM Monitoring  Patient Name: Roger Lowe. is a 75 y.o. male Date: 05/10/2021 Primary Care Physican: [name], MD Primary Cardiologist: Branch Electrophysiologist: Lovena Le 04/03/2021 Weight: 237 lbs                                                                  Attempted call to patient and unable to reach.  Left message to return call. Transmission reviewed.  Hospitalized 9/5-9/8.   CorVue thoracic impedance suggesting possible dryness starting 9/5 but was suggesting  possible fluid accumulation starting 8/17-9/1.   Prescribed: Furosemide 40 mg take 1 tablet daily. Spironolactone 25 mg take 1 tablet daily   Labs: 09/07/2020 Creatinine 1.15, BUN 8,   Potassium 3.9, Sodium 139, GFR 63-73 A complete set of results can be found in Results Review.   Recommendations:  Unable to reach.     Follow-up plan: ICM clinic phone appointment on 05/20/2021 to recheck fluid levels.   91 day device clinic remote transmission 08/07/2021.     EP/Cardiology Office Visits:  05/22/2021 with Dr Harl Bowie.   Recall 02/15/2021 with Dr. Lovena Le.     Copy of ICM check sent to Dr. Lovena Le.   3 month ICM trend: 05/09/2021.    1 Year ICM trend:       Roger Billings, RN 05/10/2021 3:29 PM

## 2021-05-13 NOTE — Progress Notes (Signed)
Spoke with patient.  He reports hospitalization for 3 days 9/5-9/8 and during that time it was discovered lung cancer has returned.  He reports the physician thinks the cancer was caught early and has oncology appointment to discuss treatment on  9/22.  He has not had any fluid or dehydration symptoms and feeling okay.  Will recheck fluid levels 9/19.  No changes and encouraged to call if experiencing any fluid symptoms.

## 2021-05-14 ENCOUNTER — Encounter (HOSPITAL_COMMUNITY): Payer: Medicare HMO

## 2021-05-14 DIAGNOSIS — I5032 Chronic diastolic (congestive) heart failure: Secondary | ICD-10-CM | POA: Diagnosis not present

## 2021-05-14 DIAGNOSIS — J9601 Acute respiratory failure with hypoxia: Secondary | ICD-10-CM | POA: Diagnosis not present

## 2021-05-14 DIAGNOSIS — I5022 Chronic systolic (congestive) heart failure: Secondary | ICD-10-CM | POA: Diagnosis not present

## 2021-05-15 DIAGNOSIS — E1065 Type 1 diabetes mellitus with hyperglycemia: Secondary | ICD-10-CM | POA: Diagnosis not present

## 2021-05-15 NOTE — Progress Notes (Signed)
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 muscular endurance and strength as measured by increased METs and functional capacity (6MWT)    Able to understand and use rate of perceived exertion (RPE) scale Yes Yes Yes Yes    Intervention Provide education and explanation on how to use RPE scale Provide education and explanation on how to use RPE scale Provide education and explanation on how to use RPE scale Provide education and explanation on how to use RPE scale    Expected Outcomes Short Term: Able to use RPE daily in rehab to express subjective intensity level;Long Term:  Able to use RPE to guide intensity level when exercising independently Short Term: Able to use RPE daily in rehab to express subjective intensity level;Long Term:  Able to use RPE to guide intensity level when exercising independently Short Term: Able to use RPE daily in rehab to express subjective intensity level;Long Term:   Able to use RPE to guide intensity level when exercising independently Short Term: Able to use RPE daily in rehab to express subjective intensity level;Long Term:  Able to use RPE to guide intensity level when exercising independently    Able to understand and use Dyspnea scale Yes Yes Yes Yes    Intervention Provide education and explanation on how to use Dyspnea scale  Provide education and explanation on how to use Dyspnea scale Provide education and explanation on how to use Dyspnea scale Provide education and explanation on how to use Dyspnea scale    Expected Outcomes Short Term: Able to use Dyspnea scale daily in rehab to express subjective sense of shortness of breath during exertion;Long Term: Able to use Dyspnea scale to guide intensity level when exercising independently Short Term: Able to use Dyspnea scale daily in rehab to express subjective sense of shortness of breath during exertion;Long Term: Able to use Dyspnea scale to guide intensity level when exercising independently Short Term: Able to use Dyspnea scale daily in rehab to express subjective sense of shortness of breath during exertion;Long Term: Able to use Dyspnea scale to guide intensity level when exercising independently Short Term: Able to use Dyspnea scale daily in rehab to express subjective sense of shortness of breath during exertion;Long Term: Able to use Dyspnea scale to guide intensity level when exercising independently    Knowledge and understanding of Target Heart Rate Range (THRR) Yes Yes Yes Yes    Intervention Provide education and explanation of THRR including how the numbers were predicted and where they are located for reference Provide education and explanation of THRR including how the numbers were predicted and where they are located for reference Provide education and explanation of THRR including how the numbers were predicted and where they are located for reference Provide education and explanation of THRR  including how the numbers were predicted and where they are located for reference    Expected Outcomes Short Term: Able to state/look up THRR;Long Term: Able to use THRR to govern intensity when exercising independently;Short Term: Able to use daily as guideline for intensity in rehab Short Term: Able to state/look up THRR;Long Term: Able to use THRR to govern intensity when exercising independently;Short Term: Able to use daily as guideline for intensity in rehab Short Term: Able to state/look up THRR;Long Term: Able to use THRR to govern intensity when exercising independently;Short Term: Able to use daily as guideline for intensity in rehab Short Term: Able to state/look up THRR;Long Term: Able to use THRR to govern intensity when exercising independently;Short Term: Able to use daily as guideline for intensity in rehab    Understanding of Exercise Prescription Yes Yes Yes Yes    Intervention Provide education, explanation, and written materials on patient's individual exercise prescription Provide education, explanation, and written materials on patient's individual exercise prescription Provide education, explanation, and written materials on patient's individual exercise prescription Provide education, explanation, and written materials on patient's individual exercise prescription    Expected Outcomes Short Term: Able to explain program exercise prescription;Long Term: Able to explain home exercise prescription to exercise independently Short Term: Able to explain program exercise prescription;Long Term: Able to explain home exercise prescription to exercise independently Short Term: Able to explain program exercise prescription;Long Term: Able to explain home exercise prescription to exercise independently Short Term: Able to explain program exercise prescription;Long Term: Able to explain home exercise prescription to exercise independently             Exercise Goals Re-Evaluation :  Exercise  Goals Re-Evaluation     Row Name 03/19/21 1214 04/16/21 1230 05/14/21 1615         Exercise Goal Re-Evaluation   Exercise Goals Review Increase Strength and Stamina;Increase Physical Activity;Able to understand and use rate of perceived exertion (RPE) scale;Able to understand and use Dyspnea scale;Knowledge and understanding of Target Heart Rate Range (THRR);Understanding  of Exercise Prescription Increase Strength and Stamina;Increase Physical Activity;Able to understand and use rate of perceived exertion (RPE) scale;Able to understand and use Dyspnea scale;Knowledge and understanding of Target Heart Rate Range (THRR);Understanding of Exercise Prescription Increase Strength and Stamina;Increase Physical Activity;Able to understand and use rate of perceived exertion (RPE) scale;Able to understand and use Dyspnea scale;Knowledge and understanding of Target Heart Rate Range (THRR);Understanding of Exercise Prescription     Comments Pt has completed 2 exercise sessions. He is limited due to his balance and back pain, but he maintains a positive attitude and is motivated to progress in the program. He is currently exercising at 1.7 METs on the stepper. Will continue to monitor and progress as able. Pt has completed 8 exercise sessions. His progress has been limited due to deconditioning and back pain. He has been progressing slowly. He is currently exercising at 1.8 METs on the stepper. Will continue to monitor and progress as able. Pt has completed 10 exercise sessions. His attendance has been poor due to health problems. During a recent hospitalization, it was discovered that his lung cancer has returned. It is unclear at this time if he will be able to continue on with the program. He was last exercising at 2.1 METs on the stepper. Will continue to monitor and progress as able.     Expected Outcomes Through exercise at rehab and at home, the patient will meet their stated goals. Through exercise at rehab and  at home, the patient will meet their stated goals. Through exercise at rehab and at home, the patient will meet their stated goals.              Discharge Exercise Prescription (Final Exercise Prescription Changes):  Exercise Prescription Changes - 05/02/21 1045       Response to Exercise   Blood Pressure (Admit) 150/82    Blood Pressure (Exercise) 150/72    Blood Pressure (Exit) 150/80    Heart Rate (Admit) 92 bpm    Heart Rate (Exercise) 92 bpm    Heart Rate (Exit) 85 bpm    Oxygen Saturation (Admit) 97 %    Oxygen Saturation (Exercise) 99 %    Oxygen Saturation (Exit) 100 %    Rating of Perceived Exertion (Exercise) 13    Perceived Dyspnea (Exercise) 13    Duration Continue with 30 min of aerobic exercise without signs/symptoms of physical distress.    Intensity THRR unchanged      Progression   Progression Continue to progress workloads to maintain intensity without signs/symptoms of physical distress.      Resistance Training   Training Prescription Yes    Weight 3 lbs    Reps 10-15    Time 10 Minutes      Oxygen   Oxygen Continuous    Liters 3      NuStep   Level 1    SPM 76    Minutes 39    METs 2.1             Nutrition:  Target Goals: Understanding of nutrition guidelines, daily intake of sodium 1500mg , cholesterol 200mg , calories 30% from fat and 7% or less from saturated fats, daily to have 5 or more servings of fruits and vegetables.  Biometrics:  Pre Biometrics - 02/25/21 1552       Pre Biometrics   Height 6' (1.829 m)    Weight 102.7 kg    Waist Circumference 45.5 inches    Hip Circumference 45.5 inches  Waist to Hip Ratio 1 %    BMI (Calculated) 30.7    Triceps Skinfold 20 mm    % Body Fat 32.2 %    Grip Strength 35.9 kg    Flexibility 0 in    Single Leg Stand 2 seconds              Nutrition Therapy Plan and Nutrition Goals:  Nutrition Therapy & Goals - 03/13/21 1354       Personal Nutrition Goals   Comments  Patient scored 24 on his diet assessment. We offer 2 educational sessions on heart healthy nutrition with handouts and offer assistance with RD referral if patient is interested.      Intervention Plan   Intervention Nutrition handout(s) given to patient.             Nutrition Assessments:  Nutrition Assessments - 02/25/21 1319       MEDFICTS Scores   Pre Score 24            MEDIFICTS Score Key: ?70 Need to make dietary changes  40-70 Heart Healthy Diet ? 40 Therapeutic Level Cholesterol Diet   Picture Your Plate Scores: <03 Unhealthy dietary pattern with much room for improvement. 41-50 Dietary pattern unlikely to meet recommendations for good health and room for improvement. 51-60 More healthful dietary pattern, with some room for improvement.  >60 Healthy dietary pattern, although there may be some specific behaviors that could be improved.    Nutrition Goals Re-Evaluation:   Nutrition Goals Discharge (Final Nutrition Goals Re-Evaluation):   Psychosocial: Target Goals: Acknowledge presence or absence of significant depression and/or stress, maximize coping skills, provide positive support system. Participant is able to verbalize types and ability to use techniques and skills needed for reducing stress and depression.  Initial Review & Psychosocial Screening:  Initial Psych Review & Screening - 02/25/21 1320       Initial Review   Current issues with Current Stress Concerns;Current Sleep Concerns    Source of Stress Concerns Family    Comments He is stressed about the  care of his grandchildren      [place]? Yes    Comments His support system is his three daughters. One lives in [place] and two other ones live in [place].      Barriers   Psychosocial barriers to participate in program The patient should benefit from training in stress management and relaxation.      Screening Interventions   Interventions Encouraged  to exercise;To provide support and resources with identified psychosocial needs;Provide feedback about the scores to participant    Expected Outcomes Short Term goal: Utilizing psychosocial counselor, staff and physician to assist with identification of specific Stressors or current issues interfering with healing process. Setting desired goal for each stressor or current issue identified.;Long Term Goal: Stressors or current issues are controlled or eliminated.;Short Term goal: Identification and review with participant of any Quality of Life or Depression concerns found by scoring the questionnaire.;Long Term goal: The participant improves quality of Life and PHQ9 Scores as seen by post scores and/or verbalization of changes             Quality of Life Scores:  Quality of Life - 02/25/21 1546       Quality of Life   Select Quality of Life      Quality of Life Scores   Health/Function Pre 6.94 %    Socioeconomic Pre 8.71 %  Psych/Spiritual Pre 6.43 %    Family Pre 13 %    GLOBAL Pre 8.1 %            Scores of 19 and below usually indicate a poorer quality of life in these areas.  A difference of  2-3 points is a clinically meaningful difference.  A difference of 2-3 points in the total score of the Quality of Life Index has been associated with significant improvement in overall quality of life, self-image, physical symptoms, and general health in studies assessing change in quality of life.   PHQ-9: Recent Review Flowsheet Data     Depression screen [facility] 2/9 02/25/2021 10/04/2020 08/03/2020   Decreased Interest 2 0 0   Down, Depressed, Hopeless 1 0 0   PHQ - 2 Score 3 0 0   Altered sleeping 3 - -   Tired, decreased energy 3 - -   Change in appetite 3 - -   Feeling bad or failure about yourself  2 - -   Trouble concentrating 2 - -   Moving slowly or fidgety/restless 2 - -   Suicidal thoughts 1 - -   PHQ-9 Score 19 - -   Difficult doing work/chores Somewhat difficult - -       Interpretation of Total Score  Total Score Depression Severity:  1-4 = Minimal depression, 5-9 = Mild depression, 10-14 = Moderate depression, 15-19 = Moderately severe depression, 20-27 = Severe depression   Psychosocial Evaluation and Intervention:  Psychosocial Evaluation - 02/25/21 1410       Psychosocial Evaluation & Interventions   Interventions Encouraged to exercise with the program and follow exercise prescription;Stress management education;Relaxation education    Comments Pt has no barriers to participating in pulmonary rehab. He reports that he is currently stressed and his stressors include how his grandchildren are being raised. His PHQ-9 score was a 19. I explained to him that this is a high score and offered to communicate with his PCP if he wanted a referral to a councelor. He declined. He states that he is currenlty unable to do any of his hobbies or things he enjoys due to his SOB. He is optimistic that the pulmonary rehab program will allow him to do some of these things again and will help him out psychosocially. His goals are to decrease his SOB and be able to return to some of his hobbies which include crafts. He is eager to start the program and is hopeful that this will increase his quality of life.    Expected Outcomes The patient's PHQ-9 levels will decrease at discharge and his psychosocial issues will be lessened.    Continue Psychosocial Services  Follow up required by staff             Psychosocial Re-Evaluation:  Psychosocial Re-Evaluation     Wann Name 03/13/21 1350 04/10/21 0747 05/09/21 0909         Psychosocial Re-Evaluation   Current issues with Current Stress Concerns;Current Sleep Concerns Current Stress Concerns;Current Sleep Concerns Current Stress Concerns;Current Sleep Concerns     Comments Patient new to the program and has not started yet. He plans to start tomorrow 03/14/21. We will continue to monitor. Patient continues to have no  psychosocial barriers identified. He has completed 7 sessions. He demonstrates a positive outlook and demonstrates an interest in improving his health. We will continue to monitor. Patient continues to have no psychosocial barriers identified. He has completed 10 sessions. He continues to demonstrate  a positive outlook and an interest in improving his health. We will continue to monitor.     Expected Outcomes Patient will have no psychosocial barriers identified. Patient will have no psychosocial barriers identified. Patient will have no psychosocial barriers identified.     Interventions Relaxation education;Stress management education;Encouraged to attend Pulmonary Rehabilitation for the exercise Relaxation education;Stress management education;Encouraged to attend Pulmonary Rehabilitation for the exercise Relaxation education;Stress management education;Encouraged to attend Pulmonary Rehabilitation for the exercise     Continue Psychosocial Services  No Follow up required No Follow up required No Follow up required           Initial Review   Source of Stress Concerns -- Family --              Psychosocial Discharge (Final Psychosocial Re-Evaluation):  Psychosocial Re-Evaluation - 05/09/21 0909       Psychosocial Re-Evaluation   Current issues with Current Stress Concerns;Current Sleep Concerns    Comments Patient continues to have no psychosocial barriers identified. He has completed 10 sessions. He continues to demonstrate a positive outlook and an interest in improving his health. We will continue to monitor.    Expected Outcomes Patient will have no psychosocial barriers identified.    Interventions Relaxation education;Stress management education;Encouraged to attend Pulmonary Rehabilitation for the exercise    Continue Psychosocial Services  No Follow up required              Education: Education Goals: Education classes will be provided on a weekly basis, covering required  topics. Participant will state understanding/return demonstration of topics presented.  Learning Barriers/Preferences:  Learning Barriers/Preferences - 02/25/21 1322       Learning Barriers/Preferences   Learning Barriers Sight    Learning Preferences Written Material             Education Topics: How Lungs Work and Diseases: - Discuss the anatomy of the lungs and diseases that can affect the lungs, such as COPD.   Exercise: -Discuss the importance of exercise, FITT principles of exercise, normal and abnormal responses to exercise, and how to exercise safely.   Environmental Irritants: -Discuss types of environmental irritants and how to limit exposure to environmental irritants. Flowsheet Row PULMONARY REHAB CHRONIC OBSTRUCTIVE PULMONARY DISEASE from 05/02/2021 in [place]  Date 03/14/21  Educator DJ  Instruction Review Code 1- Verbalizes Understanding       Meds/Inhalers and oxygen: - Discuss respiratory medications, definition of an inhaler and oxygen, and the proper way to use an inhaler and oxygen.   Energy Saving Techniques: - Discuss methods to conserve energy and decrease shortness of breath when performing activities of daily living.  Flowsheet Row PULMONARY REHAB CHRONIC OBSTRUCTIVE PULMONARY DISEASE from 05/02/2021 in [place]  Date 03/28/21  Educator pb  Instruction Review Code 1- Verbalizes Understanding       Bronchial Hygiene / Breathing Techniques: - Discuss breathing mechanics, pursed-lip breathing technique,  proper posture, effective ways to clear airways, and other functional breathing techniques Flowsheet Row PULMONARY REHAB CHRONIC OBSTRUCTIVE PULMONARY DISEASE from 05/02/2021 in [place]  Date 04/04/21  Educator Handout  Instruction Review Code 1- Research scientist (medical): - Provides group verbal and written instruction about the health risks  of elevated stress, cause of high stress, and healthy ways to reduce stress.   Nutrition I: Fats: - Discuss the types of cholesterol, what cholesterol does to the body, and how cholesterol levels  can be controlled.   Nutrition II: Labels: -Discuss the different components of food labels and how to read food labels.   Respiratory Infections: - Discuss the signs and symptoms of respiratory infections, ways to prevent respiratory infections, and the importance of seeking medical treatment when having a respiratory infection. Flowsheet Row PULMONARY REHAB CHRONIC OBSTRUCTIVE PULMONARY DISEASE from 05/02/2021 in [place]  Date 05/02/21  Educator DJ  Instruction Review Code 1- Verbalizes Understanding       Stress I: Signs and Symptoms: - Discuss the causes of stress, how stress may lead to anxiety and depression, and ways to limit stress.   Stress II: Relaxation: -Discuss relaxation techniques to limit stress.   Oxygen for Home/Travel: - Discuss how to prepare for travel when on oxygen and proper ways to transport and store oxygen to ensure safety.   Knowledge Questionnaire Score:  Knowledge Questionnaire Score - 02/25/21 1326       Knowledge Questionnaire Score   Pre Score 15/18             Core Components/Risk Factors/Patient Goals at Admission:  Personal Goals and Risk Factors at Admission - 02/25/21 1327       Core Components/Risk Factors/Patient Goals on Admission    Weight Management Yes;Obesity;Weight Maintenance    Intervention Obesity: Provide education and appropriate resources to help participant work on and attain dietary goals.;Weight Management/Obesity: Establish reasonable short term and long term weight goals.;Weight Management: Provide education and appropriate resources to help participant work on and attain dietary goals.;Weight Management: Develop a combined nutrition and exercise program designed to reach desired caloric  intake, while maintaining appropriate intake of nutrient and fiber, sodium and fats, and appropriate energy expenditure required for the weight goal.    Expected Outcomes Short Term: Continue to assess and modify interventions until short term weight is achieved;Long Term: Adherence to nutrition and physical activity/exercise program aimed toward attainment of established weight goal;Weight Maintenance: Understanding of the daily nutrition guidelines, which includes 25-35% calories from fat, 7% or less cal from saturated fats, less than 200mg  cholesterol, less than 1.5gm of sodium, & 5 or more servings of fruits and vegetables daily;Weight Loss: Understanding of general recommendations for a balanced deficit meal plan, which promotes 1-2 lb weight loss per week and includes a negative energy balance of [phone] kcal/d;Understanding recommendations for meals to include 15-35% energy as protein, 25-35% energy from fat, 35-60% energy from carbohydrates, less than 200mg  of dietary cholesterol, 20-35 gm of total fiber daily;Understanding of distribution of calorie intake throughout the day with the consumption of 4-5 meals/snacks    Improve shortness of breath with ADL's Yes    Intervention Provide education, individualized exercise plan and daily activity instruction to help decrease symptoms of SOB with activities of daily living.    Expected Outcomes Short Term: Improve cardiorespiratory fitness to achieve a reduction of symptoms when performing ADLs;Long Term: Be able to perform more ADLs without symptoms or delay the onset of symptoms    Increase knowledge of respiratory medications and ability to use respiratory devices properly  Yes    Intervention Provide education and demonstration as needed of appropriate use of medications, inhalers, and oxygen therapy.    Expected Outcomes Short Term: Achieves understanding of medications use. Understands that oxygen is a medication prescribed by physician.  Demonstrates appropriate use of inhaler and oxygen therapy.;Long Term: Maintain appropriate use of medications, inhalers, and oxygen therapy.    Diabetes Yes    Intervention Provide education about signs/symptoms  and action to take for hypo/hyperglycemia.;Provide education about proper nutrition, including hydration, and aerobic/resistive exercise prescription along with prescribed medications to achieve blood glucose in normal ranges: Fasting glucose 65-99 mg/dL    Expected Outcomes Short Term: Participant verbalizes understanding of the signs/symptoms and immediate care of hyper/hypoglycemia, proper foot care and importance of medication, aerobic/resistive exercise and nutrition plan for blood glucose control.;Long Term: Attainment of HbA1C < 7%.    Heart Failure Yes    Intervention Provide a combined exercise and nutrition program that is supplemented with education, support and counseling about heart failure. Directed toward relieving symptoms such as shortness of breath, decreased exercise tolerance, and extremity edema.    Expected Outcomes Improve functional capacity of life;Short term: Attendance in program 2-3 days a week with increased exercise capacity. Reported lower sodium intake. Reported increased fruit and vegetable intake. Reports medication compliance.;Short term: Daily weights obtained and reported for increase. Utilizing diuretic protocols set by physician.;Long term: Adoption of self-care skills and reduction of barriers for early signs and symptoms recognition and intervention leading to self-care maintenance.    Hypertension Yes    Intervention Provide education on lifestyle modifcations including regular physical activity/exercise, weight management, moderate sodium restriction and increased consumption of fresh fruit, vegetables, and low fat dairy, alcohol moderation, and smoking cessation.;Monitor prescription use compliance.    Expected Outcomes Short Term: Continued assessment  and intervention until BP is < 140/47mm HG in hypertensive participants. < 130/67mm HG in hypertensive participants with diabetes, heart failure or chronic kidney disease.;Long Term: Maintenance of blood pressure at goal levels.    Lipids Yes    Intervention Provide education and support for participant on nutrition & aerobic/resistive exercise along with prescribed medications to achieve LDL 70mg , HDL >40mg .    Expected Outcomes Short Term: Participant states understanding of desired cholesterol values and is compliant with medications prescribed. Participant is following exercise prescription and nutrition guidelines.;Long Term: Cholesterol controlled with medications as prescribed, with individualized exercise RX and with personalized nutrition plan. Value goals: LDL < 70mg , HDL > 40 mg.    Stress Yes    Intervention Offer individual and/or small group education and counseling on adjustment to heart disease, stress management and health-related lifestyle change. Teach and support self-help strategies.;Refer participants experiencing significant psychosocial distress to appropriate mental health specialists for further evaluation and treatment. When possible, include family members and significant others in education/counseling sessions.    Expected Outcomes Short Term: Participant demonstrates changes in health-related behavior, relaxation and other stress management skills, ability to obtain effective social support, and compliance with psychotropic medications if prescribed.;Long Term: Emotional wellbeing is indicated by absence of clinically significant psychosocial distress or social isolation.             Core Components/Risk Factors/Patient Goals Review:   Goals and Risk Factor Review     Row Name 03/13/21 1355 04/10/21 0748 05/09/21 0910         Core Components/Risk Factors/Patient Goals Review   Personal Goals Review Weight Management/Obesity;Improve shortness of breath with ADL's  Weight Management/Obesity;Improve shortness of breath with ADL's Weight Management/Obesity;Improve shortness of breath with ADL's     Review Patient was referred to [facility] with COPD stage III by Dr. Valeta Harms. He will start the program 03/14/21. His personal goals for the program are to decrease his SOB and be able to return to his hobbies/crafts and ADL's. We will continue to monitor his progress as he works towards meeting these goals. Patient has completed 7 sessions losing 1 lb  since last 30 day review. He is doing well in the program with consistent attendance. He exercises on 3L with saturations running 96-98%. He is hypertensive at times. We will continue to monitor this. His personal goals for the program are to decrease his SOB; return to his hobbies/crafts and ADL's. We will continue to monitor his progress as he works towards meeting these goals. Patient has completed 10 sessions gaining 2 KG since last 30 day review. He has been doing well in the program with consistent attendance and progressions. He is currently hosptialized with hematemsis. He was scheduled to have an ECG 9/7 but was postponed due to glucose 70 mg/dl. He saw his cardilogist 8/19 for routine check. He increased hydralazine to 75 mg bid due to elevated blood pressure. His blood pressure has been hypertensive in rehab. He exercises on 3L saturating at 98 to 99% during exercise. His personal goals continues to be to decrease his SOB; and be able to return to his hobbies/crafts and his ADL's. We will continue monitor his progress in the hosptial.     Expected Outcomes Patient will complete the program meeting both personal and program goals. Patient will complete the program meeting both personal and program goals. Patient will complete the program meeting both personal and program goals.              Core Components/Risk Factors/Patient Goals at Discharge (Final Review):   Goals and Risk Factor Review - 05/09/21 0910       Core  Components/Risk Factors/Patient Goals Review   Personal Goals Review Weight Management/Obesity;Improve shortness of breath with ADL's    Review Patient has completed 10 sessions gaining 2 KG since last 30 day review. He has been doing well in the program with consistent attendance and progressions. He is currently hosptialized with hematemsis. He was scheduled to have an ECG 9/7 but was postponed due to glucose 70 mg/dl. He saw his cardilogist 8/19 for routine check. He increased hydralazine to 75 mg bid due to elevated blood pressure. His blood pressure has been hypertensive in rehab. He exercises on 3L saturating at 98 to 99% during exercise. His personal goals continues to be to decrease his SOB; and be able to return to his hobbies/crafts and his ADL's. We will continue monitor his progress in the hosptial.    Expected Outcomes Patient will complete the program meeting both personal and program goals.             ITP Comments:   Comments: ITP REVIEW Pt is making expected progress toward pulmonary rehab goals after completing 11 sessions. Recommend continued exercise, life style modification, education, and utilization of breathing techniques to increase stamina and strength and decrease shortness of breath with exertion.

## 2021-05-16 ENCOUNTER — Encounter (HOSPITAL_COMMUNITY)
Admission: RE | Admit: 2021-05-16 | Discharge: 2021-05-16 | Disposition: A | Discharge status: Home / Self Care | Payer: Medicare HMO | Source: Ambulatory Visit | Attending: Pulmonary Disease | Admitting: Pulmonary Disease

## 2021-05-16 ENCOUNTER — Other Ambulatory Visit: Payer: Self-pay

## 2021-05-16 DIAGNOSIS — J449 Chronic obstructive pulmonary disease, unspecified: Secondary | ICD-10-CM

## 2021-05-16 NOTE — Progress Notes (Signed)
Remote ICD transmission.   

## 2021-05-16 NOTE — Progress Notes (Signed)
Daily Session Note  Patient Details  Name: Roger Lowe. MRN: [id number] Date of Birth: September 05, 1945 Referring Provider:   Flowsheet Row PULMONARY REHAB COPD ORIENTATION from 02/25/2021 in [place]  Referring Provider Dr. Valeta Harms       Encounter Date: 05/16/2021  Check In:  Session Check In - 05/16/21 1045       Check-In   Supervising physician immediately available to respond to emergencies [facility] MD immediately available    Physician(s) Dr. Domenic Polite    Location [facility]    Staff Present Hoy Register, MS, ACSM-CEP, Exercise Physiologist;Other    Virtual Visit No    Medication changes reported     No    Fall or balance concerns reported    No    Tobacco Cessation No Change    Warm-up and Cool-down Performed as group-led instruction    Resistance Training Performed Yes    VAD Patient? No    PAD/SET Patient? No      Pain Assessment   Currently in Pain? Yes    Pain Score 8     Pain Location Back    Pain Orientation Lower    Pain Descriptors / Indicators Constant    Pain Type Chronic pain    Pain Onset More than a month ago    Pain Frequency Constant    Aggravating Factors  WB/movement    Pain Relieving Factors hydrocodone    Multiple Pain Sites No             Capillary Blood Glucose: No results found for this or any previous visit (from the past 24 hour(s)).    Social History   Tobacco Use  Smoking Status Former   Packs/day: 1.00   Years: 20.00   Pack years: 20.00   Types: Cigarettes   Start date: 1968   Quit date: 09/04/2009   Years since quitting: 11.7  Smokeless Tobacco Never    Goals Met:  Independence with exercise equipment Exercise tolerated well No report of concerns or symptoms today Strength training completed today  Goals Unmet:  Not Applicable  Comments: check out 11:45   Dr. Kathie Dike is Medical Director for [facility].

## 2021-05-20 ENCOUNTER — Ambulatory Visit (INDEPENDENT_AMBULATORY_CARE_PROVIDER_SITE_OTHER): Payer: Medicare HMO

## 2021-05-20 DIAGNOSIS — Z95810 Presence of automatic (implantable) cardiac defibrillator: Secondary | ICD-10-CM

## 2021-05-20 DIAGNOSIS — I5022 Chronic systolic (congestive) heart failure: Secondary | ICD-10-CM

## 2021-05-20 NOTE — Progress Notes (Signed)
EPIC Encounter for ICM Monitoring  Patient Name: Roger Lowe. is a 75 y.o. male Date: 05/20/2021 Primary Care Physican: [name], MD Primary Cardiologist: Branch Electrophysiologist: Lovena Le 04/03/2021 Weight: 237 lbs                                                                  Spoke with patient and heart failure questions reviewed.  Pt asymptomatic for fluid accumulation and feeling well.  He has oncologist appointment 9/22 due to lung cancer has returned.   CorVue thoracic impedance suggesting fluid levels have returned to normal.   Prescribed: Furosemide 40 mg take 1 tablet daily. Spironolactone 25 mg take 1 tablet daily   Labs: 09/07/2020 Creatinine 1.15, BUN 8,   Potassium 3.9, Sodium 139, GFR 63-73 A complete set of results can be found in Results Review.   Recommendations:  No changes and encouraged to call if experiencing any fluid symptoms.   Follow-up plan: ICM clinic phone appointment on 06/17/2021.   91 day device clinic remote transmission 08/07/2021.     EP/Cardiology Office Visits:  05/22/2021 with Dr Harl Bowie.   Recall 02/15/2021 with Dr. Lovena Le.     Copy of ICM check sent to Dr. Lovena Le.    3 month ICM trend: 05/20/2021.    1 Year ICM trend:       Roger Billings, RN 05/20/2021 3:43 PM

## 2021-05-21 ENCOUNTER — Encounter (HOSPITAL_COMMUNITY): Payer: Medicare HMO

## 2021-05-22 ENCOUNTER — Ambulatory Visit: Payer: Medicare HMO | Admitting: Cardiology

## 2021-05-23 ENCOUNTER — Encounter (HOSPITAL_COMMUNITY): Payer: Medicare HMO

## 2021-05-23 ENCOUNTER — Encounter (HOSPITAL_COMMUNITY)
Admission: RE | Admit: 2021-05-23 | Discharge: 2021-05-23 | Disposition: A | Discharge status: Home / Self Care | Payer: Medicare HMO | Source: Ambulatory Visit | Attending: Hematology | Admitting: Hematology

## 2021-05-23 ENCOUNTER — Ambulatory Visit: Payer: Medicare HMO | Admitting: Infectious Diseases

## 2021-05-23 ENCOUNTER — Ambulatory Visit (INDEPENDENT_AMBULATORY_CARE_PROVIDER_SITE_OTHER): Payer: Medicare HMO | Admitting: Infectious Diseases

## 2021-05-23 ENCOUNTER — Other Ambulatory Visit: Payer: Self-pay

## 2021-05-23 VITALS — BP 149/85 | HR 85 | Wt 227.0 lb

## 2021-05-23 DIAGNOSIS — A319 Mycobacterial infection, unspecified: Secondary | ICD-10-CM

## 2021-05-23 DIAGNOSIS — C799 Secondary malignant neoplasm of unspecified site: Secondary | ICD-10-CM | POA: Diagnosis not present

## 2021-05-23 DIAGNOSIS — J320 Chronic maxillary sinusitis: Secondary | ICD-10-CM | POA: Diagnosis not present

## 2021-05-23 DIAGNOSIS — R911 Solitary pulmonary nodule: Secondary | ICD-10-CM

## 2021-05-23 DIAGNOSIS — C3490 Malignant neoplasm of unspecified part of unspecified bronchus or lung: Secondary | ICD-10-CM | POA: Insufficient documentation

## 2021-05-23 DIAGNOSIS — Z2239 Carrier of other specified bacterial diseases: Secondary | ICD-10-CM

## 2021-05-23 DIAGNOSIS — I2510 Atherosclerotic heart disease of native coronary artery without angina pectoris: Secondary | ICD-10-CM | POA: Diagnosis not present

## 2021-05-23 DIAGNOSIS — A318 Other mycobacterial infections: Secondary | ICD-10-CM

## 2021-05-23 IMAGING — CT NM PET TUM IMG INITIAL (PI) SKULL BASE T - THIGH
7 series · 23 of 25 positions shown · non-contrast
Comparison: Comparison made with previous CT of the abdomen and
pelvis as well as CT of the chest from [DATE].

CLINICAL DATA: Initial treatment strategy for non-small cell lung
cancer staging in a 75-year-old male.

EXAM:
NUCLEAR MEDICINE PET SKULL BASE TO THIGH
TECHNIQUE: 11.75 mCi F-18 FDG was injected intravenously. Full-ring PET imaging
was performed from the skull base to thigh after the radiotracer. CT
data was obtained and used for attenuation correction and anatomic
localization.
Fasting blood glucose: 112 mg/dl

[Series 3: ctac · axial · 3.0mm · 0.98mm/px · z∈[-954,-78]mm · 5 of 293 slices shown]
[im 1/293]
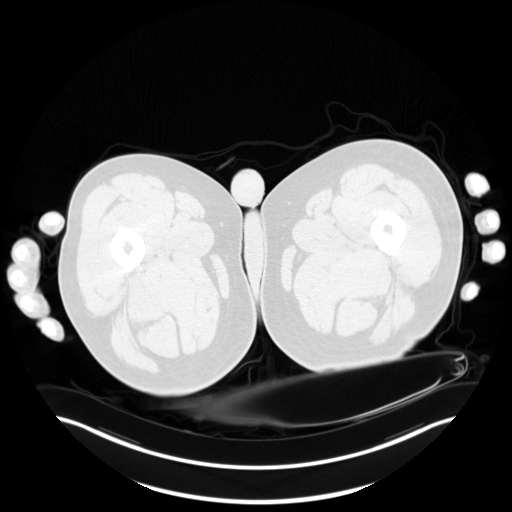
[im 74/293]
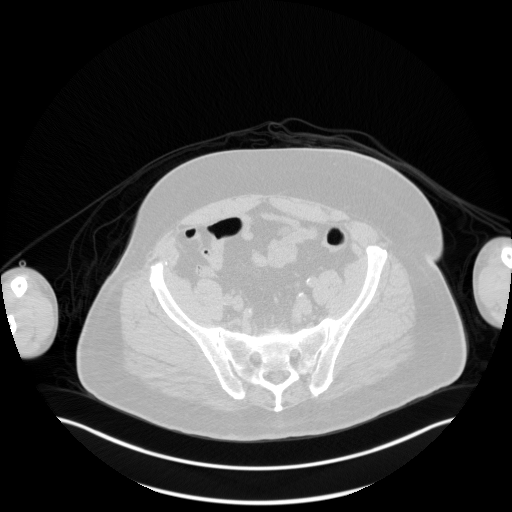
[im 147/293]
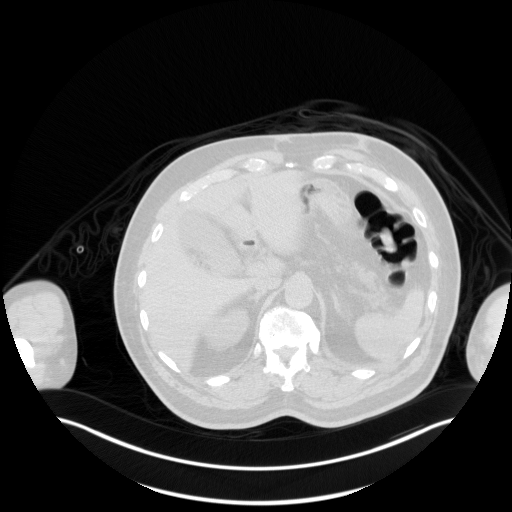
[im 220/293]
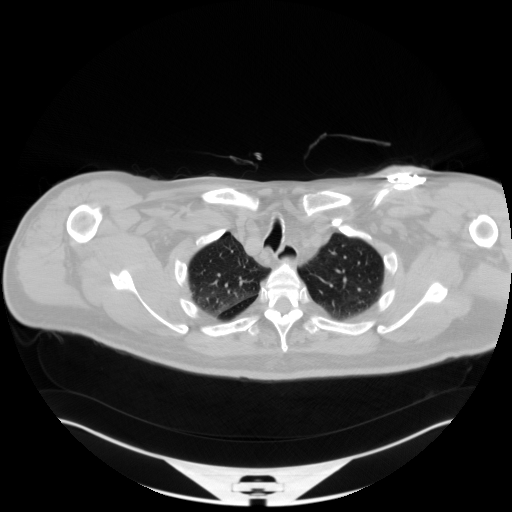
[im 293/293  brain]
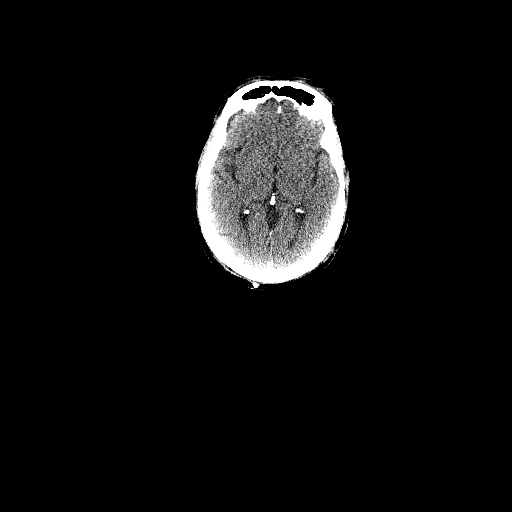

[Series 4: pet ac · axial · 3.0mm · 4.11mm/px · z∈[-954,-78]mm · 3 of 293 slices shown]
[im 1/293]
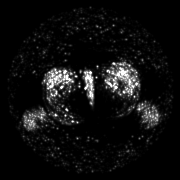
[im 195/293]
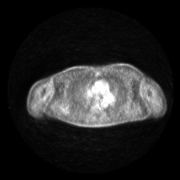
[im 293/293]
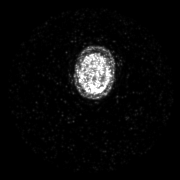

[Series 5: pet nac · axial · 3.0mm · 4.11mm/px · z∈[-954,-78]mm · 4 of 293 slices shown]
[im 1/293]
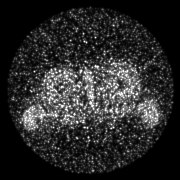
[im 98/293]
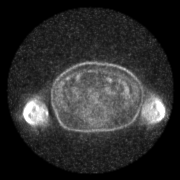
[im 195/293]
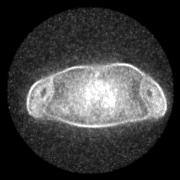
[im 293/293]
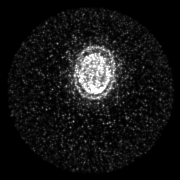

[Series 7: ct lung · axial · 3.0mm · 0.98mm/px · z∈[-517,-233]mm · 3 of 238 slices shown]
[im 1/238]
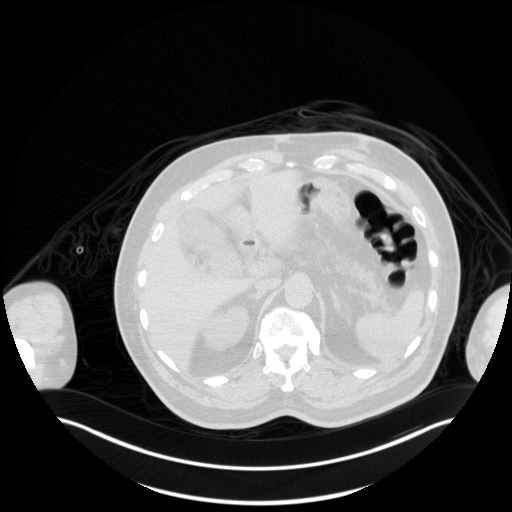
[im 119/238]
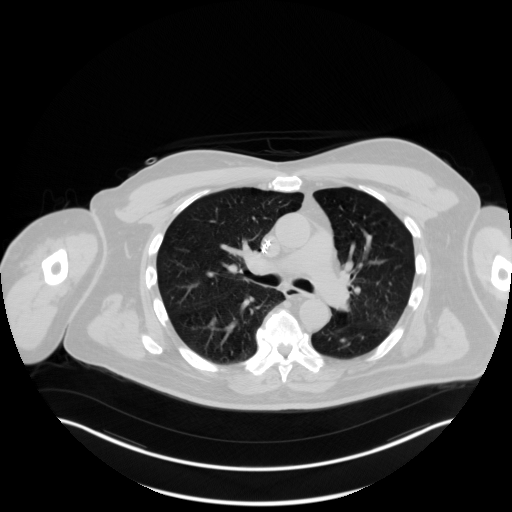
[im 238/238]
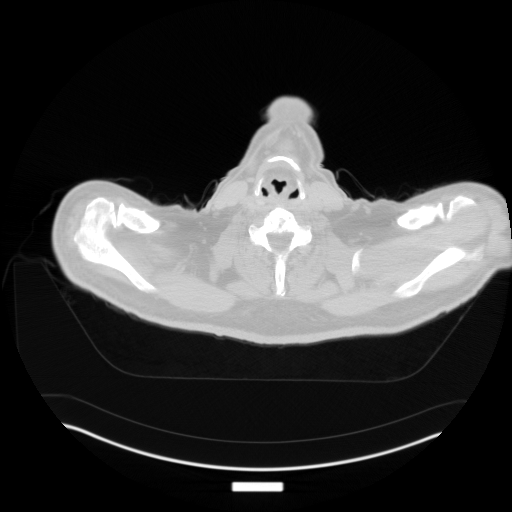

[Series 605: fused tra · 5 of 438 slices shown]
[im 1/438]
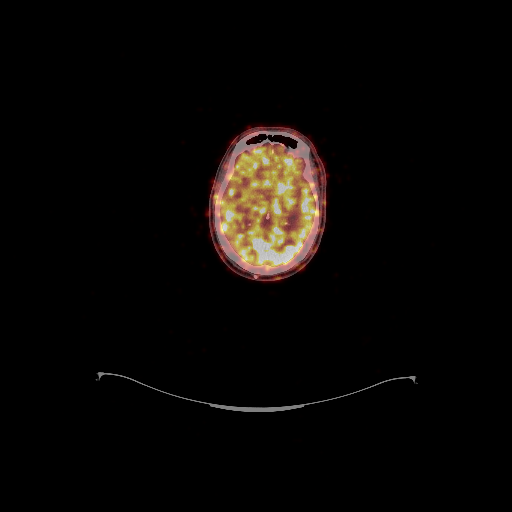
[im 88/438]
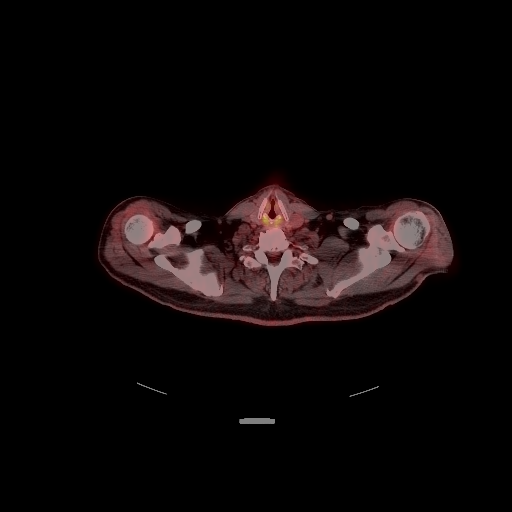
[im 263/438]
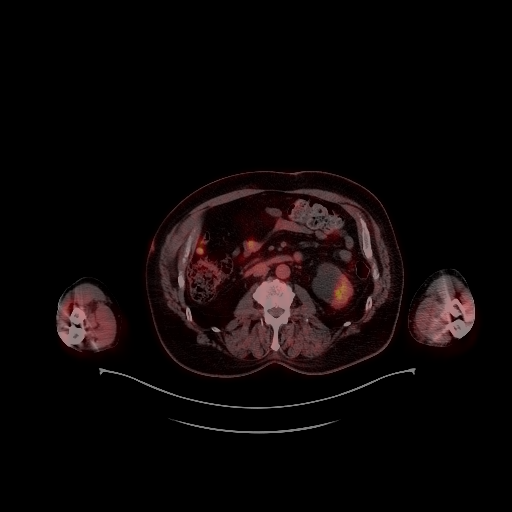
[im 350/438]
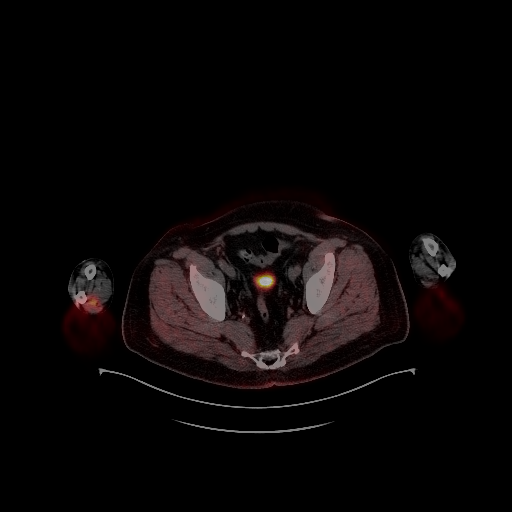
[im 438/438]
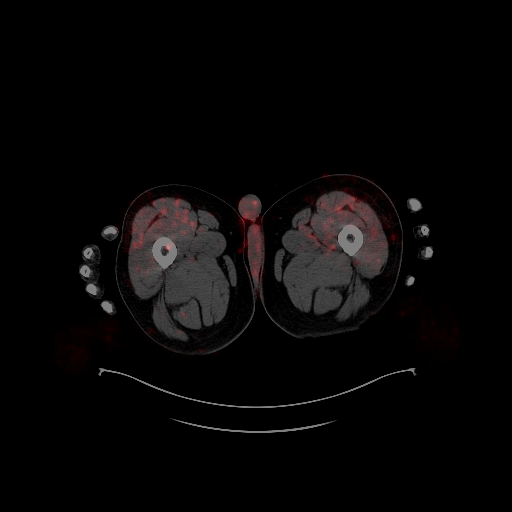

[Series 606: fused cor · 2 of 172 slices shown]
[im 1/172]
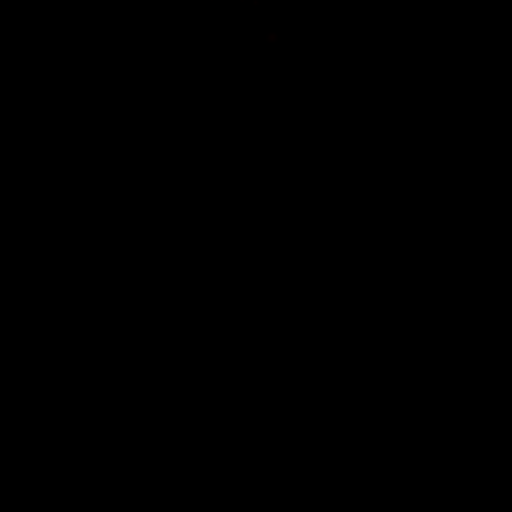
[im 172/172]
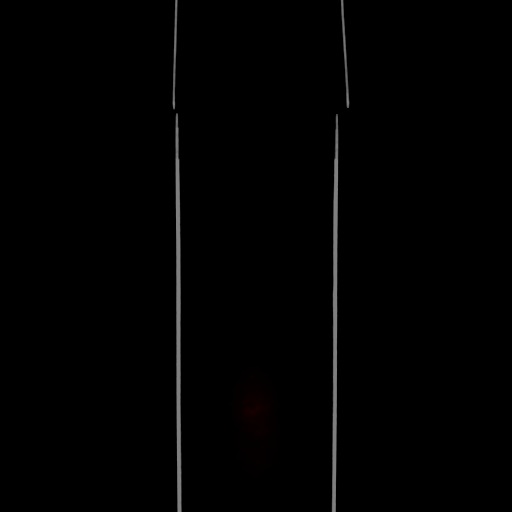

[Series 607: mip cine · coronal · 1.82mm/px · 1 of 48 slices shown]
[im 1/48]
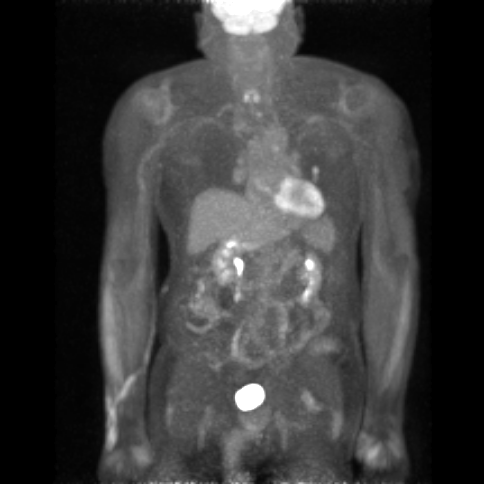

[23 of 25 positions shown; findings below may reference images not displayed]

FINDINGS: Mediastinal blood pool activity: SUV max

Liver activity: SUV max NA

NECK: Small posterior triangle lymph node (image 35/3) 7 mm with a
maximum SUV of 2.75. Contralateral posterior triangle lymph node
smaller and with FDG uptake and SUV values less than mediastinal
blood pool.

Incidental CT findings: Bilateral maxillary sinus disease.

CHEST: Partially cavitary LEFT lower lobe nodule displays moderate
FDG uptake and no change since recent imaging, abutting and
distorting the major fissure in the superior segment of the LEFT
lower lobe. Maximum SUV in this area 4.67. Basilar atelectasis. No
additional suspicious nodule. AP window lymph node/LEFT paratracheal
lymph node with a maximum SUV of 4.17 (image 90/3) quite small at 9
mm.

Adjacent lymph node without increased metabolic activity. No LEFT
hilar lymph nodes with increased metabolic activity.

Incidental CT findings: Dual lead pacer defibrillator, leads in the
RIGHT heart undo that multi lead pacer defibrillator power pack over
the LEFT chest. Calcified coronary artery disease and generalized
atherosclerosis. Heart size mildly enlarged no substantial
pericardial effusion. Limited assessment of cardiovascular
structures given lack of intravenous contrast.

Multiple areas of stranding in the subcutaneous fat along the chest
wall and another area in the subcutaneous fat of the back (image
71/3)

Anterior chest wall areas in the chest medial to the nipple
bilaterally on image 121 of series 3. Other small areas as well.

Basilar airspace disease bilaterally similar to previous imaging
associated with some bronchiectasis on the LEFT.

ABDOMEN/PELVIS: No abnormal hypermetabolic activity within the
liver, pancreas, adrenal glands, or spleen. No hypermetabolic lymph
nodes in the abdomen or pelvis.

Incidental CT findings: LEFT renal cysts. Aortic atherosclerosis
without aneurysm. No acute abdominal process. Prostatomegaly.

SKELETON: No focal hypermetabolic activity to suggest skeletal
metastasis. Increased metabolic activity over the LEFT and RIGHT
abdomen likely due to injection sites.

Incidental CT findings: Spinal degenerative changes without acute or
destructive bone process.
IMPRESSION: Hypermetabolic LEFT lower lobe nodule abutting the fissure
persistent/enlarging since at least [DATE] suspicious for
small bronchogenic neoplasm.

Mildly hypermetabolic AP window lymph node is suspicious for
ipsilateral mediastinal nodal involvement. This would be difficult
to distinguish from reactive lymph nodes in the chest.

Other areas of airspace disease in the LEFT lung base show waxing
and waning features, compatible with infectious or inflammatory
changes. Correlate with any risk factors for or history of
aspiration. These findings are similar to the most recent chest CT.

Small lymph nodes on the RIGHT and LEFT at level VA are nonspecific
and would be an unusual location for isolated metastases beyond the
chest, favor reactive changes. Consider clinical follow-up and or
focused ultrasound follow-up for assessment as warranted.

Multiple areas of nodularity in the soft tissues of the anterior
chest in the subcutaneous fat. Correlate with direct clinical
inspection for any signs of cutaneous lesions, also single area over
the RIGHT back.

Aortic Atherosclerosis ([KS]-[KS]).

## 2021-05-23 MED ORDER — FLUDEOXYGLUCOSE F - 18 (FDG) INJECTION
11.7500 | Freq: Once | INTRAVENOUS | Status: AC | PRN
  Administered 2021-05-23: 11.75 via INTRAVENOUS
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
## 2021-05-24 DIAGNOSIS — E1165 Type 2 diabetes mellitus with hyperglycemia: Secondary | ICD-10-CM | POA: Diagnosis not present

## 2021-05-24 DIAGNOSIS — I10 Essential (primary) hypertension: Secondary | ICD-10-CM | POA: Diagnosis not present

## 2021-05-28 ENCOUNTER — Encounter (HOSPITAL_COMMUNITY): Payer: Medicare HMO

## 2021-05-30 ENCOUNTER — Encounter (HOSPITAL_COMMUNITY): Payer: Medicare HMO

## 2021-05-30 ENCOUNTER — Other Ambulatory Visit: Payer: Self-pay

## 2021-05-30 ENCOUNTER — Other Ambulatory Visit: Payer: Medicare HMO

## 2021-05-30 DIAGNOSIS — N1831 Chronic kidney disease, stage 3a: Secondary | ICD-10-CM | POA: Diagnosis not present

## 2021-05-30 DIAGNOSIS — Z9981 Dependence on supplemental oxygen: Secondary | ICD-10-CM | POA: Diagnosis not present

## 2021-05-30 DIAGNOSIS — R269 Unspecified abnormalities of gait and mobility: Secondary | ICD-10-CM | POA: Diagnosis not present

## 2021-05-30 DIAGNOSIS — I509 Heart failure, unspecified: Secondary | ICD-10-CM | POA: Diagnosis not present

## 2021-05-30 DIAGNOSIS — Z950 Presence of cardiac pacemaker: Secondary | ICD-10-CM | POA: Diagnosis not present

## 2021-05-30 DIAGNOSIS — I2510 Atherosclerotic heart disease of native coronary artery without angina pectoris: Secondary | ICD-10-CM | POA: Diagnosis not present

## 2021-05-30 DIAGNOSIS — K219 Gastro-esophageal reflux disease without esophagitis: Secondary | ICD-10-CM | POA: Diagnosis not present

## 2021-05-30 DIAGNOSIS — E1165 Type 2 diabetes mellitus with hyperglycemia: Secondary | ICD-10-CM | POA: Diagnosis not present

## 2021-05-30 DIAGNOSIS — A318 Other mycobacterial infections: Secondary | ICD-10-CM

## 2021-05-30 DIAGNOSIS — I10 Essential (primary) hypertension: Secondary | ICD-10-CM | POA: Diagnosis not present

## 2021-05-30 DIAGNOSIS — J189 Pneumonia, unspecified organism: Secondary | ICD-10-CM | POA: Diagnosis not present

## 2021-05-30 DIAGNOSIS — A319 Mycobacterial infection, unspecified: Secondary | ICD-10-CM | POA: Diagnosis not present

## 2021-05-30 DIAGNOSIS — Z0001 Encounter for general adult medical examination with abnormal findings: Secondary | ICD-10-CM | POA: Diagnosis not present

## 2021-05-30 NOTE — Addendum Note (Signed)
Addended by: Caffie Pinto on: 05/30/2021 03:40 PM   Modules accepted: Orders

## 2021-05-31 DIAGNOSIS — J449 Chronic obstructive pulmonary disease, unspecified: Secondary | ICD-10-CM | POA: Diagnosis not present

## 2021-06-01 NOTE — Progress Notes (Signed)
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 subluxation, or dislocation. IMPRESSION: No acute bony abnormality. Electronically Signed   By: Rolm Baptise M.D.   On: 05/06/2021 22:29   [place] EKG SITE RITE  Result Date: 05/08/2021 If Site Rite image not attached, placement could not be confirmed due to current cardiac rhythm.    ASSESSMENT:  1.  Left lung nodule with AP window lymph node: - He reports that he had a history of left lung cancer, treated with chemo and radiation therapy in 1996 in [place]. - CT chest with contrast on 05/06/2021 showed 13 x 15 mm left lower lobe cavitary nodule, increased from previous measurement of 8 x 12 mm and new from September 2020 exam.  Single enlarged AP window lymph node measuring 14 mm in short axis.  2.  Social/family history: - He is retired and worked in [employer].  He was an ex-smoker, quit 14 years ago, 2 packs/day. - He lives at home with his brother.  He uses electric wheelchair to get around the house secondary to dyspnea on exertion and back and hip pain.  No family history of malignancies.   PLAN:  1.  Cavitary left lower lobe lung nodule and AP window lymph node: -  We  have reviewed PET CT scan images which showed partially cavitary left lower lobe nodule with SUV 4.67.  AP window lymph node with SUV 4.17, size 9 mm.  Small lymph nodes in the right and left level 5A are nonspecific. - Recommend MRI of the brain with and without contrast to complete work-up. - He has a follow-up with Dr. Jamey Ripa on 06/12/2021. - Recommend bronchoscopy and biopsy. - RTC after biopsy.  2.  Hematemesis: - Xarelto was restarted.  He does not report any hemoptysis or hematemesis at this time.  3.  History of DVT and pulmonary embolism: - History of pulmonary embolism around 2017.  He has been on Xarelto since then.  4.  History of left lung cancer: - He was treated with chemo and radiation therapy 1995 in [place].   Orders placed this encounter:  No orders of the defined types were placed in this encounter.    Derek Jack, MD [place] [phone]   I, Thana Ates, am acting as a scribe for Dr. Derek Jack.  I, Derek Jack MD, have reviewed the above documentation for accuracy and completeness, and I agree with the above.

## 2021-06-03 ENCOUNTER — Other Ambulatory Visit: Payer: Self-pay

## 2021-06-03 ENCOUNTER — Inpatient Hospital Stay (HOSPITAL_COMMUNITY): Payer: Medicare HMO | Attending: Hematology | Admitting: Hematology

## 2021-06-03 VITALS — BP 157/95 | HR 88 | Temp 97.8°F | Resp 16 | Wt 233.7 lb

## 2021-06-03 DIAGNOSIS — R918 Other nonspecific abnormal finding of lung field: Secondary | ICD-10-CM | POA: Insufficient documentation

## 2021-06-03 DIAGNOSIS — Z86711 Personal history of pulmonary embolism: Secondary | ICD-10-CM | POA: Diagnosis not present

## 2021-06-03 DIAGNOSIS — C3490 Malignant neoplasm of unspecified part of unspecified bronchus or lung: Secondary | ICD-10-CM | POA: Diagnosis not present

## 2021-06-03 DIAGNOSIS — Z7901 Long term (current) use of anticoagulants: Secondary | ICD-10-CM | POA: Diagnosis not present

## 2021-06-03 DIAGNOSIS — Z86718 Personal history of other venous thrombosis and embolism: Secondary | ICD-10-CM | POA: Insufficient documentation

## 2021-06-03 DIAGNOSIS — Z23 Encounter for immunization: Secondary | ICD-10-CM | POA: Diagnosis not present

## 2021-06-03 DIAGNOSIS — K920 Hematemesis: Secondary | ICD-10-CM | POA: Diagnosis not present

## 2021-06-03 DIAGNOSIS — Z85118 Personal history of other malignant neoplasm of bronchus and lung: Secondary | ICD-10-CM | POA: Diagnosis not present

## 2021-06-03 MED ORDER — INFLUENZA VAC A&B SA ADJ QUAD 0.5 ML IM PRSY
0.5000 mL | PREFILLED_SYRINGE | Freq: Once | INTRAMUSCULAR | Status: AC
  Administered 2021-06-03: 0.5 mL via INTRAMUSCULAR
  Filled 2021-06-03: qty 0.5

## 2021-06-03 NOTE — Patient Instructions (Addendum)
[place] at [facility] Discharge Instructions  You were seen today by Dr. Delton Coombes. He went over your recent results and scans. You will be scheduled for a MRI scan of your brain prior to your next visit. Dr. Delton Coombes will see you back in 1 month for labs and follow up.   Thank you for choosing [place] at [facility] to provide your oncology and hematology care.  To afford each patient quality time with our provider, please arrive at least 15 minutes before your scheduled appointment time.   If you have a lab appointment with the [place] please come in thru the Main Entrance and check in at the main information desk  You need to re-schedule your appointment should you arrive 10 or more minutes late.  We strive to give you quality time with our providers, and arriving late affects you and other patients whose appointments are after yours.  Also, if you no show three or more times for appointments you may be dismissed from the clinic at the providers discretion.     Again, thank you for choosing [facility].  Our hope is that these requests will decrease the amount of time that you wait before being seen by our physicians.       _____________________________________________________________  Should you have questions after your visit to [facility], please contact our office at [phone] between the hours of 8:00 a.m. and 4:30 p.m.  Voicemails left after 4:00 p.m. will not be returned until the following business day.  For prescription refill requests, have your pharmacy contact our office and allow 72 hours.    Cancer Center Support Programs:   > Cancer Support Group  2nd Tuesday of the month 1pm-2pm, Journey Room

## 2021-06-03 NOTE — Progress Notes (Signed)
Roger Lowe. presents today for injection per the provider's orders.  Fluad administration without incident; injection site WNL; see MAR for injection details.  Patient tolerated procedure well and without incident. Patient remained stable the entire visit. No questions or complaints noted at this time. Patient discharged ambulatory and in stable condition.

## 2021-06-04 ENCOUNTER — Encounter (HOSPITAL_COMMUNITY): Payer: Medicare HMO

## 2021-06-06 ENCOUNTER — Encounter (HOSPITAL_COMMUNITY): Payer: Medicare HMO

## 2021-06-06 NOTE — Addendum Note (Signed)
Encounter addended by: Philis Kendall on: 06/06/2021 10:43 AM  Actions taken: Episode resolved, Flowsheet accepted, Clinical Note Signed

## 2021-06-06 NOTE — Progress Notes (Signed)
Discharge Progress Report  Patient Details  Name: Roger Lowe. MRN: [id number] Date of Birth: 05/17/1946 Referring Provider:   Flowsheet Row PULMONARY REHAB COPD ORIENTATION from 02/25/2021 in [place]  Referring Provider Dr. Valeta Harms        Number of Visits: 12  Reason for Discharge:  Early Exit:  Personal: Patient's lung cancer has returned and needs to go through treatment for this.  Smoking History:  Social History   Tobacco Use  Smoking Status Former   Packs/day: 1.00   Years: 20.00   Pack years: 20.00   Types: Cigarettes   Start date: 1968   Quit date: 09/04/2009   Years since quitting: 11.7  Smokeless Tobacco Never    Diagnosis:  Stage 3 severe COPD by GOLD classification ([place])  ADL [facility]SD:  Pulmonary Assessment Scores     Row Name 02/25/21 1317         ADL UCSD   ADL Phase Entry     SOB Score total 103           CAT Score   CAT Score 38           mMRC Score   mMRC Score 4              Initial Exercise Prescription:  Initial Exercise Prescription - 02/25/21 1500       Date of Initial Exercise RX and Referring Provider   Date 02/25/21    Referring Provider Dr. Valeta Harms    Expected Discharge Date 07/11/21      Oxygen   Oxygen Continuous    Liters 3      NuStep   Level 1    SPM 80    Minutes 39      Prescription Details   Frequency (times per week) 2    Duration Progress to 30 minutes of continuous aerobic without signs/symptoms of physical distress      Intensity   THRR 40-80% of Max Heartrate 59-118    Ratings of Perceived Exertion 11-13    Perceived Dyspnea 0-4      Resistance Training   Training Prescription Yes    Weight 3 lbs    Reps 10-15             Discharge Exercise Prescription (Final Exercise Prescription Changes):  Exercise Prescription Changes - 05/16/21 1045       Response to Exercise   Blood Pressure (Admit) 150/76    Blood Pressure (Exercise) 182/84    Blood Pressure (Exit)  148/90    Heart Rate (Admit) 90 bpm    Heart Rate (Exercise) 91 bpm    Heart Rate (Exit) 84 bpm    Oxygen Saturation (Admit) 97 %    Oxygen Saturation (Exercise) 100 %    Oxygen Saturation (Exit) 100 %    Rating of Perceived Exertion (Exercise) 13    Perceived Dyspnea (Exercise) 13    Duration Continue with 30 min of aerobic exercise without signs/symptoms of physical distress.    Intensity THRR unchanged      Progression   Progression Continue to progress workloads to maintain intensity without signs/symptoms of physical distress.      Resistance Training   Training Prescription Yes    Weight 3    Reps 10-15    Time 10 Minutes      Oxygen   Oxygen Continuous    Liters 3      NuStep   Level 1    SPM 73  Minutes 39    METs 1.8             Functional Capacity:  6 Minute Walk     Row Name 02/25/21 1547         6 Minute Walk   Phase Initial     Distance 600 feet     Walk Time 6 minutes     # of Rest Breaks 0     MPH 1.14     METS 1.89     RPE 13     Perceived Dyspnea  15     VO2 Peak 6.62     Symptoms No     Resting HR 89 bpm     Resting BP 148/90     Resting Oxygen Saturation  97 %     Exercise Oxygen Saturation  during 6 min walk 94 %     Max Ex. HR 116 bpm     Max Ex. BP 172/88     2 Minute Post BP 150/80           Interval HR   1 Minute HR 104     2 Minute HR 111     3 Minute HR 112     4 Minute HR 115     5 Minute HR 116     6 Minute HR 113     2 Minute Post HR 99     Interval Heart Rate? Yes           Interval Oxygen   Interval Oxygen? Yes     Baseline Oxygen Saturation % 97 %     1 Minute Oxygen Saturation % 98 %     1 Minute Liters of Oxygen 3 L     2 Minute Oxygen Saturation % 99 %     2 Minute Liters of Oxygen 3 L     3 Minute Oxygen Saturation % 98 %     3 Minute Liters of Oxygen 3 L     4 Minute Oxygen Saturation % 99 %     4 Minute Liters of Oxygen 3 L     5 Minute Oxygen Saturation % 97 %     5 Minute Liters of Oxygen  3 L     6 Minute Oxygen Saturation % 94 %     6 Minute Liters of Oxygen 3 L     2 Minute Post Oxygen Saturation % 97 %     2 Minute Post Liters of Oxygen 3 L              Psychological, QOL, Others - Outcomes: PHQ 2/9: Depression screen [facility] 2/9 05/23/2021 02/25/2021 10/04/2020 08/03/2020  Decreased Interest 0 2 0 0  Down, Depressed, Hopeless 0 1 0 0  PHQ - 2 Score 0 3 0 0  Altered sleeping - 3 - -  Tired, decreased energy - 3 - -  Change in appetite - 3 - -  Feeling bad or failure about yourself  - 2 - -  Trouble concentrating - 2 - -  Moving slowly or fidgety/restless - 2 - -  Suicidal thoughts - 1 - -  PHQ-9 Score - 19 - -  Difficult doing work/chores - Somewhat difficult - -    Quality of Life:  Quality of Life - 02/25/21 1546       Quality of Life   Select Quality of Life      Quality of Life Scores  Health/Function Pre 6.94 %    Socioeconomic Pre 8.71 %    Psych/Spiritual Pre 6.43 %    Family Pre 13 %    GLOBAL Pre 8.1 %             Personal Goals: Goals established at orientation with interventions provided to work toward goal.  Personal Goals and Risk Factors at Admission - 02/25/21 1327       Core Components/Risk Factors/Patient Goals on Admission    Weight Management Yes;Obesity;Weight Maintenance    Intervention Obesity: Provide education and appropriate resources to help participant work on and attain dietary goals.;Weight Management/Obesity: Establish reasonable short term and long term weight goals.;Weight Management: Provide education and appropriate resources to help participant work on and attain dietary goals.;Weight Management: Develop a combined nutrition and exercise program designed to reach desired caloric intake, while maintaining appropriate intake of nutrient and fiber, sodium and fats, and appropriate energy expenditure required for the weight goal.    Expected Outcomes Short Term: Continue to assess and modify interventions until short  term weight is achieved;Long Term: Adherence to nutrition and physical activity/exercise program aimed toward attainment of established weight goal;Weight Maintenance: Understanding of the daily nutrition guidelines, which includes 25-35% calories from fat, 7% or less cal from saturated fats, less than 264m cholesterol, less than 1.5gm of sodium, & 5 or more servings of fruits and vegetables daily;Weight Loss: Understanding of general recommendations for a balanced deficit meal plan, which promotes 1-2 lb weight loss per week and includes a negative energy balance of [phone] kcal/d;Understanding recommendations for meals to include 15-35% energy as protein, 25-35% energy from fat, 35-60% energy from carbohydrates, less than 2020mof dietary cholesterol, 20-35 gm of total fiber daily;Understanding of distribution of calorie intake throughout the day with the consumption of 4-5 meals/snacks    Improve shortness of breath with ADL's Yes    Intervention Provide education, individualized exercise plan and daily activity instruction to help decrease symptoms of SOB with activities of daily living.    Expected Outcomes Short Term: Improve cardiorespiratory fitness to achieve a reduction of symptoms when performing ADLs;Long Term: Be able to perform more ADLs without symptoms or delay the onset of symptoms    Increase knowledge of respiratory medications and ability to use respiratory devices properly  Yes    Intervention Provide education and demonstration as needed of appropriate use of medications, inhalers, and oxygen therapy.    Expected Outcomes Short Term: Achieves understanding of medications use. Understands that oxygen is a medication prescribed by physician. Demonstrates appropriate use of inhaler and oxygen therapy.;Long Term: Maintain appropriate use of medications, inhalers, and oxygen therapy.    Diabetes Yes    Intervention Provide education about signs/symptoms and action to take for  hypo/hyperglycemia.;Provide education about proper nutrition, including hydration, and aerobic/resistive exercise prescription along with prescribed medications to achieve blood glucose in normal ranges: Fasting glucose 65-99 mg/dL    Expected Outcomes Short Term: Participant verbalizes understanding of the signs/symptoms and immediate care of hyper/hypoglycemia, proper foot care and importance of medication, aerobic/resistive exercise and nutrition plan for blood glucose control.;Long Term: Attainment of HbA1C < 7%.    Heart Failure Yes    Intervention Provide a combined exercise and nutrition program that is supplemented with education, support and counseling about heart failure. Directed toward relieving symptoms such as shortness of breath, decreased exercise tolerance, and extremity edema.    Expected Outcomes Improve functional capacity of life;Short term: Attendance in program 2-3 days a week with  increased exercise capacity. Reported lower sodium intake. Reported increased fruit and vegetable intake. Reports medication compliance.;Short term: Daily weights obtained and reported for increase. Utilizing diuretic protocols set by physician.;Long term: Adoption of self-care skills and reduction of barriers for early signs and symptoms recognition and intervention leading to self-care maintenance.    Hypertension Yes    Intervention Provide education on lifestyle modifcations including regular physical activity/exercise, weight management, moderate sodium restriction and increased consumption of fresh fruit, vegetables, and low fat dairy, alcohol moderation, and smoking cessation.;Monitor prescription use compliance.    Expected Outcomes Short Term: Continued assessment and intervention until BP is < 140/68mm HG in hypertensive participants. < 130/61mm HG in hypertensive participants with diabetes, heart failure or chronic kidney disease.;Long Term: Maintenance of blood pressure at goal levels.    Lipids  Yes    Intervention Provide education and support for participant on nutrition & aerobic/resistive exercise along with prescribed medications to achieve LDL '70mg'$ , HDL >$Remo'40mg'NQPTd$ .    Expected Outcomes Short Term: Participant states understanding of desired cholesterol values and is compliant with medications prescribed. Participant is following exercise prescription and nutrition guidelines.;Long Term: Cholesterol controlled with medications as prescribed, with individualized exercise RX and with personalized nutrition plan. Value goals: LDL < $Rem'70mg'iAsM$ , HDL > 40 mg.    Stress Yes    Intervention Offer individual and/or small group education and counseling on adjustment to heart disease, stress management and health-related lifestyle change. Teach and support self-help strategies.;Refer participants experiencing significant psychosocial distress to appropriate mental health specialists for further evaluation and treatment. When possible, include family members and significant others in education/counseling sessions.    Expected Outcomes Short Term: Participant demonstrates changes in health-related behavior, relaxation and other stress management skills, ability to obtain effective social support, and compliance with psychotropic medications if prescribed.;Long Term: Emotional wellbeing is indicated by absence of clinically significant psychosocial distress or social isolation.              Personal Goals Discharge:  Goals and Risk Factor Review     Row Name 03/13/21 1355 04/10/21 0748 05/09/21 0910         Core Components/Risk Factors/Patient Goals Review   Personal Goals Review Weight Management/Obesity;Improve shortness of breath with ADL's Weight Management/Obesity;Improve shortness of breath with ADL's Weight Management/Obesity;Improve shortness of breath with ADL's     Review Patient was referred to [facility] with COPD stage III by Dr. Valeta Harms. He will start the program 03/14/21. His personal goals for the  program are to decrease his SOB and be able to return to his hobbies/crafts and ADL's. We will continue to monitor his progress as he works towards meeting these goals. Patient has completed 7 sessions losing 1 lb since last 30 day review. He is doing well in the program with consistent attendance. He exercises on 3L with saturations running 96-98%. He is hypertensive at times. We will continue to monitor this. His personal goals for the program are to decrease his SOB; return to his hobbies/crafts and ADL's. We will continue to monitor his progress as he works towards meeting these goals. Patient has completed 10 sessions gaining 2 KG since last 30 day review. He has been doing well in the program with consistent attendance and progressions. He is currently hosptialized with hematemsis. He was scheduled to have an ECG 9/7 but was postponed due to glucose 70 mg/dl. He saw his cardilogist 8/19 for routine check. He increased hydralazine to 75 mg bid due to elevated blood pressure. His blood  pressure has been hypertensive in rehab. He exercises on 3L saturating at 98 to 99% during exercise. His personal goals continues to be to decrease his SOB; and be able to return to his hobbies/crafts and his ADL's. We will continue monitor his progress in the hosptial.     Expected Outcomes Patient will complete the program meeting both personal and program goals. Patient will complete the program meeting both personal and program goals. Patient will complete the program meeting both personal and program goals.              Exercise Goals and Review:  Exercise Goals     Row Name 02/25/21 1552 03/19/21 1214 04/16/21 1230 05/14/21 1614       Exercise Goals   Increase Physical Activity Yes Yes Yes Yes    Intervention Provide advice, education, support and counseling about physical activity/exercise needs.;Develop an individualized exercise prescription for aerobic and resistive training based on initial evaluation  findings, risk stratification, comorbidities and participant's personal goals. Provide advice, education, support and counseling about physical activity/exercise needs.;Develop an individualized exercise prescription for aerobic and resistive training based on initial evaluation findings, risk stratification, comorbidities and participant's personal goals. Provide advice, education, support and counseling about physical activity/exercise needs.;Develop an individualized exercise prescription for aerobic and resistive training based on initial evaluation findings, risk stratification, comorbidities and participant's personal goals. Provide advice, education, support and counseling about physical activity/exercise needs.;Develop an individualized exercise prescription for aerobic and resistive training based on initial evaluation findings, risk stratification, comorbidities and participant's personal goals.    Expected Outcomes Short Term: Attend rehab on a regular basis to increase amount of physical activity.;Long Term: Add in home exercise to make exercise part of routine and to increase amount of physical activity.;Long Term: Exercising regularly at least 3-5 days a week. Short Term: Attend rehab on a regular basis to increase amount of physical activity.;Long Term: Add in home exercise to make exercise part of routine and to increase amount of physical activity.;Long Term: Exercising regularly at least 3-5 days a week. Short Term: Attend rehab on a regular basis to increase amount of physical activity.;Long Term: Add in home exercise to make exercise part of routine and to increase amount of physical activity.;Long Term: Exercising regularly at least 3-5 days a week. Short Term: Attend rehab on a regular basis to increase amount of physical activity.;Long Term: Add in home exercise to make exercise part of routine and to increase amount of physical activity.;Long Term: Exercising regularly at least 3-5 days a  week.    Increase Strength and Stamina Yes Yes Yes Yes    Intervention Provide advice, education, support and counseling about physical activity/exercise needs.;Develop an individualized exercise prescription for aerobic and resistive training based on initial evaluation findings, risk stratification, comorbidities and participant's personal goals. Provide advice, education, support and counseling about physical activity/exercise needs.;Develop an individualized exercise prescription for aerobic and resistive training based on initial evaluation findings, risk stratification, comorbidities and participant's personal goals. Provide advice, education, support and counseling about physical activity/exercise needs.;Develop an individualized exercise prescription for aerobic and resistive training based on initial evaluation findings, risk stratification, comorbidities and participant's personal goals. Provide advice, education, support and counseling about physical activity/exercise needs.;Develop an individualized exercise prescription for aerobic and resistive training based on initial evaluation findings, risk stratification, comorbidities and participant's personal goals.    Expected Outcomes Short Term: Increase workloads from initial exercise prescription for resistance, speed, and METs.;Short Term: Perform resistance training exercises routinely during  rehab and add in resistance training at home;Long Term: Improve cardiorespiratory fitness, muscular endurance and strength as measured by increased METs and functional capacity (6MWT) Short Term: Increase workloads from initial exercise prescription for resistance, speed, and METs.;Short Term: Perform resistance training exercises routinely during rehab and add in resistance training at home;Long Term: Improve cardiorespiratory fitness, muscular endurance and strength as measured by increased METs and functional capacity (6MWT) Short Term: Increase workloads  from initial exercise prescription for resistance, speed, and METs.;Short Term: Perform resistance training exercises routinely during rehab and add in resistance training at home;Long Term: Improve cardiorespiratory fitness, muscular endurance and strength as measured by increased METs and functional capacity (6MWT) Short Term: Increase workloads from initial exercise prescription for resistance, speed, and METs.;Short Term: Perform resistance training exercises routinely during rehab and add in resistance training at home;Long Term: Improve cardiorespiratory fitness, muscular endurance and strength as measured by increased METs and functional capacity (6MWT)    Able to understand and use rate of perceived exertion (RPE) scale Yes Yes Yes Yes    Intervention Provide education and explanation on how to use RPE scale Provide education and explanation on how to use RPE scale Provide education and explanation on how to use RPE scale Provide education and explanation on how to use RPE scale    Expected Outcomes Short Term: Able to use RPE daily in rehab to express subjective intensity level;Long Term:  Able to use RPE to guide intensity level when exercising independently Short Term: Able to use RPE daily in rehab to express subjective intensity level;Long Term:  Able to use RPE to guide intensity level when exercising independently Short Term: Able to use RPE daily in rehab to express subjective intensity level;Long Term:  Able to use RPE to guide intensity level when exercising independently Short Term: Able to use RPE daily in rehab to express subjective intensity level;Long Term:  Able to use RPE to guide intensity level when exercising independently    Able to understand and use Dyspnea scale Yes Yes Yes Yes    Intervention Provide education and explanation on how to use Dyspnea scale Provide education and explanation on how to use Dyspnea scale Provide education and explanation on how to use Dyspnea scale  Provide education and explanation on how to use Dyspnea scale    Expected Outcomes Short Term: Able to use Dyspnea scale daily in rehab to express subjective sense of shortness of breath during exertion;Long Term: Able to use Dyspnea scale to guide intensity level when exercising independently Short Term: Able to use Dyspnea scale daily in rehab to express subjective sense of shortness of breath during exertion;Long Term: Able to use Dyspnea scale to guide intensity level when exercising independently Short Term: Able to use Dyspnea scale daily in rehab to express subjective sense of shortness of breath during exertion;Long Term: Able to use Dyspnea scale to guide intensity level when exercising independently Short Term: Able to use Dyspnea scale daily in rehab to express subjective sense of shortness of breath during exertion;Long Term: Able to use Dyspnea scale to guide intensity level when exercising independently    Knowledge and understanding of Target Heart Rate Range (THRR) Yes Yes Yes Yes    Intervention Provide education and explanation of THRR including how the numbers were predicted and where they are located for reference Provide education and explanation of THRR including how the numbers were predicted and where they are located for reference Provide education and explanation of THRR including how the numbers  were predicted and where they are located for reference Provide education and explanation of THRR including how the numbers were predicted and where they are located for reference    Expected Outcomes Short Term: Able to state/look up THRR;Long Term: Able to use THRR to govern intensity when exercising independently;Short Term: Able to use daily as guideline for intensity in rehab Short Term: Able to state/look up THRR;Long Term: Able to use THRR to govern intensity when exercising independently;Short Term: Able to use daily as guideline for intensity in rehab Short Term: Able to state/look up  THRR;Long Term: Able to use THRR to govern intensity when exercising independently;Short Term: Able to use daily as guideline for intensity in rehab Short Term: Able to state/look up THRR;Long Term: Able to use THRR to govern intensity when exercising independently;Short Term: Able to use daily as guideline for intensity in rehab    Understanding of Exercise Prescription Yes Yes Yes Yes    Intervention Provide education, explanation, and written materials on patient's individual exercise prescription Provide education, explanation, and written materials on patient's individual exercise prescription Provide education, explanation, and written materials on patient's individual exercise prescription Provide education, explanation, and written materials on patient's individual exercise prescription    Expected Outcomes Short Term: Able to explain program exercise prescription;Long Term: Able to explain home exercise prescription to exercise independently Short Term: Able to explain program exercise prescription;Long Term: Able to explain home exercise prescription to exercise independently Short Term: Able to explain program exercise prescription;Long Term: Able to explain home exercise prescription to exercise independently Short Term: Able to explain program exercise prescription;Long Term: Able to explain home exercise prescription to exercise independently             Exercise Goals Re-Evaluation:  Exercise Goals Re-Evaluation     Row Name 03/19/21 1214 04/16/21 1230 05/14/21 1615         Exercise Goal Re-Evaluation   Exercise Goals Review Increase Strength and Stamina;Increase Physical Activity;Able to understand and use rate of perceived exertion (RPE) scale;Able to understand and use Dyspnea scale;Knowledge and understanding of Target Heart Rate Range (THRR);Understanding of Exercise Prescription Increase Strength and Stamina;Increase Physical Activity;Able to understand and use rate of  perceived exertion (RPE) scale;Able to understand and use Dyspnea scale;Knowledge and understanding of Target Heart Rate Range (THRR);Understanding of Exercise Prescription Increase Strength and Stamina;Increase Physical Activity;Able to understand and use rate of perceived exertion (RPE) scale;Able to understand and use Dyspnea scale;Knowledge and understanding of Target Heart Rate Range (THRR);Understanding of Exercise Prescription     Comments Pt has completed 2 exercise sessions. He is limited due to his balance and back pain, but he maintains a positive attitude and is motivated to progress in the program. He is currently exercising at 1.7 METs on the stepper. Will continue to monitor and progress as able. Pt has completed 8 exercise sessions. His progress has been limited due to deconditioning and back pain. He has been progressing slowly. He is currently exercising at 1.8 METs on the stepper. Will continue to monitor and progress as able. Pt has completed 10 exercise sessions. His attendance has been poor due to health problems. During a recent hospitalization, it was discovered that his lung cancer has returned. It is unclear at this time if he will be able to continue on with the program. He was last exercising at 2.1 METs on the stepper. Will continue to monitor and progress as able.     Expected Outcomes Through exercise at rehab and  at home, the patient will meet their stated goals. Through exercise at rehab and at home, the patient will meet their stated goals. Through exercise at rehab and at home, the patient will meet their stated goals.              Nutrition & Weight - Outcomes:  Pre Biometrics - 02/25/21 1552       Pre Biometrics   Height 6' (1.829 m)    Weight 226 lb 6.6 oz (102.7 kg)    Waist Circumference 45.5 inches    Hip Circumference 45.5 inches    Waist to Hip Ratio 1 %    BMI (Calculated) 30.7    Triceps Skinfold 20 mm    % Body Fat 32.2 %    Grip Strength 35.9 kg     Flexibility 0 in    Single Leg Stand 2 seconds              Nutrition:  Nutrition Therapy & Goals - 03/13/21 1354       Personal Nutrition Goals   Comments Patient scored 24 on his diet assessment. We offer 2 educational sessions on heart healthy nutrition with handouts and offer assistance with RD referral if patient is interested.      Intervention Plan   Intervention Nutrition handout(s) given to patient.             Nutrition Discharge:  Nutrition Assessments - 02/25/21 1319       MEDFICTS Scores   Pre Score 24             Education Questionnaire Score:  Knowledge Questionnaire Score - 02/25/21 1326       Knowledge Questionnaire Score   Pre Score 15/18             Goals reviewed with patient; copy given to patient. Pt discharged from pulmonary rehab after 12 sessions. His attendance was poor due to health problems and severe back/lower extremity pain. His lung cancer has returned and he will need to go through treatment for this. I have encouraged him to get another referral for pulmonary rehab from his [place] provider after his treatments. His MET levels ranged from 1.7-2.1 while he was in the program.

## 2021-06-08 DIAGNOSIS — J449 Chronic obstructive pulmonary disease, unspecified: Secondary | ICD-10-CM | POA: Diagnosis not present

## 2021-06-11 ENCOUNTER — Encounter (HOSPITAL_COMMUNITY): Payer: Medicare HMO

## 2021-06-12 ENCOUNTER — Ambulatory Visit (INDEPENDENT_AMBULATORY_CARE_PROVIDER_SITE_OTHER): Payer: Medicare HMO | Admitting: Pulmonary Disease

## 2021-06-12 ENCOUNTER — Other Ambulatory Visit: Payer: Self-pay

## 2021-06-12 ENCOUNTER — Encounter: Payer: Self-pay | Admitting: Pulmonary Disease

## 2021-06-12 VITALS — BP 116/82 | HR 94 | Temp 97.6°F | Ht 72.0 in | Wt 233.8 lb

## 2021-06-12 DIAGNOSIS — R942 Abnormal results of pulmonary function studies: Secondary | ICD-10-CM

## 2021-06-12 DIAGNOSIS — J418 Mixed simple and mucopurulent chronic bronchitis: Secondary | ICD-10-CM | POA: Diagnosis not present

## 2021-06-12 DIAGNOSIS — A318 Other mycobacterial infections: Secondary | ICD-10-CM

## 2021-06-12 DIAGNOSIS — R911 Solitary pulmonary nodule: Secondary | ICD-10-CM

## 2021-06-12 DIAGNOSIS — J449 Chronic obstructive pulmonary disease, unspecified: Secondary | ICD-10-CM | POA: Diagnosis not present

## 2021-06-12 DIAGNOSIS — J9611 Chronic respiratory failure with hypoxia: Secondary | ICD-10-CM

## 2021-06-12 DIAGNOSIS — I5022 Chronic systolic (congestive) heart failure: Secondary | ICD-10-CM

## 2021-06-12 DIAGNOSIS — Z85118 Personal history of other malignant neoplasm of bronchus and lung: Secondary | ICD-10-CM
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
## 2021-06-12 NOTE — Patient Instructions (Signed)
Thank you for visiting Dr. Valeta Harms at [facility]. Today we recommend the following:  Orders Placed This Encounter  Procedures   Procedural/ Surgical Case Request: VIDEO BRONCHOSCOPY WITH ENDOBRONCHIAL NAVIGATION   CT Super D Chest Wo Contrast   Ambulatory referral to Pulmonology   Bronchoscopy to be scheduled on 07/09/2021  Return in about 5 weeks (around 07/17/2021) for with APP or Dr. Valeta Harms.    Please do your part to reduce the spread of COVID-19.
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
## 2021-06-13 ENCOUNTER — Telehealth: Payer: Self-pay | Admitting: Pulmonary Disease

## 2021-06-13 ENCOUNTER — Encounter (HOSPITAL_COMMUNITY): Payer: Medicare HMO

## 2021-06-13 DIAGNOSIS — I5022 Chronic systolic (congestive) heart failure: Secondary | ICD-10-CM | POA: Diagnosis not present

## 2021-06-13 DIAGNOSIS — I5032 Chronic diastolic (congestive) heart failure: Secondary | ICD-10-CM | POA: Diagnosis not present

## 2021-06-13 DIAGNOSIS — J9601 Acute respiratory failure with hypoxia: Secondary | ICD-10-CM | POA: Diagnosis not present

## 2021-06-13 NOTE — Telephone Encounter (Signed)
Pt has been made aware of the following:  CT - 11/2 @ 1:30 at [facility] (requested disk to [facility], Attn:  Vista Lawman) ENB - 11/15 @ 9:15, check in by 6:45 AM Stop Louanna Raw on 11/5  Gave to [employer] for [place].

## 2021-06-14 DIAGNOSIS — E1065 Type 1 diabetes mellitus with hyperglycemia: Secondary | ICD-10-CM | POA: Diagnosis not present

## 2021-06-17 ENCOUNTER — Ambulatory Visit (INDEPENDENT_AMBULATORY_CARE_PROVIDER_SITE_OTHER): Payer: Medicare HMO

## 2021-06-17 DIAGNOSIS — Z95810 Presence of automatic (implantable) cardiac defibrillator: Secondary | ICD-10-CM

## 2021-06-17 DIAGNOSIS — I5022 Chronic systolic (congestive) heart failure: Secondary | ICD-10-CM | POA: Diagnosis not present

## 2021-06-18 ENCOUNTER — Encounter (HOSPITAL_COMMUNITY): Payer: Medicare HMO

## 2021-06-19 ENCOUNTER — Telehealth: Payer: Self-pay

## 2021-06-19 NOTE — Telephone Encounter (Signed)
Remote ICM transmission received.  Attempted call to patient regarding ICM remote transmission and person answering the phone stated he was sleeping.

## 2021-06-19 NOTE — Progress Notes (Signed)
EPIC Encounter for ICM Monitoring  Patient Name: Roger Lowe. is a 75 y.o. male Date: 06/19/2021 Primary Care Physican: [name], MD Primary Cardiologist: Branch Electrophysiologist: Lovena Le 04/03/2021 Weight: 237 lbs                                                                  Attempted call to patient and unable to reach.  Transmission reviewed.    CorVue thoracic impedance normal but suggesting possible fluid accumulation from 9/30-10/7 and dryness from 10/11-10/17   Prescribed: Furosemide 40 mg take 1 tablet daily. Spironolactone 25 mg take 1 tablet daily   Labs: 05/09/2021 Creatinine 1.07, BUN 13, Potassium 4.0, Sodium 136, GFR >60 05/08/2021 Creatinine 1.06, BUN 13, Potassium 4.0, Sodium 139, GFR >60  05/07/2021 Creatinine 1.17, BUN 15, Potassium 4.6, Sodium 137, GFR >60  05/06/2021 Creatinine 1.33, BUN 15, Potassium 3.8, Sodium 137, GFR 56  A complete set of results can be found in Results Review.   Recommendations:  Unable to reach.     Follow-up plan: ICM clinic phone appointment on 07/29/2021.   91 day device clinic remote transmission 08/07/2021.     EP/Cardiology Office Visits:  10/24/2021 with Dr Harl Bowie.   Recall 02/15/2021 with Dr. Lovena Le.     Copy of ICM check sent to Dr. Lovena Le.     3 month ICM trend: 06/18/2021.    1 Year ICM trend:       [name], RN 06/19/2021 10:41 AM

## 2021-06-20 ENCOUNTER — Encounter (HOSPITAL_COMMUNITY): Payer: Medicare HMO

## 2021-06-24 DIAGNOSIS — J449 Chronic obstructive pulmonary disease, unspecified: Secondary | ICD-10-CM | POA: Diagnosis not present

## 2021-06-25 ENCOUNTER — Encounter (HOSPITAL_COMMUNITY): Payer: Medicare HMO

## 2021-06-27 ENCOUNTER — Encounter (HOSPITAL_COMMUNITY): Payer: Medicare HMO

## 2021-06-29 DIAGNOSIS — R269 Unspecified abnormalities of gait and mobility: Secondary | ICD-10-CM | POA: Diagnosis not present

## 2021-06-29 DIAGNOSIS — I509 Heart failure, unspecified: Secondary | ICD-10-CM | POA: Diagnosis not present

## 2021-07-01 ENCOUNTER — Other Ambulatory Visit (HOSPITAL_COMMUNITY): Payer: Medicare HMO

## 2021-07-01 ENCOUNTER — Ambulatory Visit (HOSPITAL_COMMUNITY): Payer: Medicare HMO | Admitting: Hematology

## 2021-07-01 DIAGNOSIS — E1165 Type 2 diabetes mellitus with hyperglycemia: Secondary | ICD-10-CM | POA: Diagnosis not present

## 2021-07-01 DIAGNOSIS — K219 Gastro-esophageal reflux disease without esophagitis: Secondary | ICD-10-CM | POA: Diagnosis not present

## 2021-07-02 ENCOUNTER — Encounter (HOSPITAL_COMMUNITY): Payer: Medicare HMO

## 2021-07-03 ENCOUNTER — Other Ambulatory Visit: Payer: Self-pay

## 2021-07-03 ENCOUNTER — Ambulatory Visit (HOSPITAL_COMMUNITY)
Admission: RE | Admit: 2021-07-03 | Discharge: 2021-07-03 | Disposition: A | Discharge status: Home / Self Care | Payer: Medicare HMO | Source: Ambulatory Visit | Attending: Pulmonary Disease | Admitting: Pulmonary Disease

## 2021-07-03 DIAGNOSIS — R942 Abnormal results of pulmonary function studies: Secondary | ICD-10-CM | POA: Insufficient documentation

## 2021-07-03 DIAGNOSIS — I700 Atherosclerosis of aorta: Secondary | ICD-10-CM | POA: Diagnosis not present

## 2021-07-03 DIAGNOSIS — R911 Solitary pulmonary nodule: Secondary | ICD-10-CM | POA: Diagnosis not present

## 2021-07-03 IMAGING — CT CT CHEST SUPER D W/O CM
2 of 4 series · 15 of 36 positions shown, 18 images · non-contrast
Comparison: CT [DATE].

CLINICAL DATA: Suspected pulmonary neoplasm.  Upcoming biopsy.

EXAM:
CT CHEST WITHOUT CONTRAST
TECHNIQUE: Multidetector CT imaging of the chest was performed using thin slice
collimation for electromagnetic bronchoscopy planning purposes,
without intravenous contrast.

[Series 3: super d-thins · axial · 0.65mm/px · z∈[+1188,+1464]mm · 12 of 387 slices shown, 15 images]
[im 21/387  mediastinal]
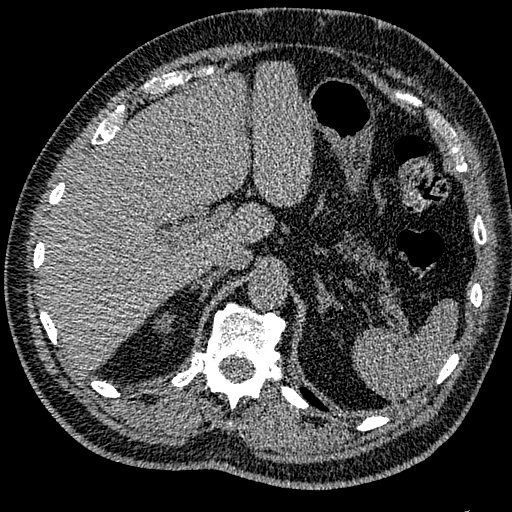
[im 21/387  lung]
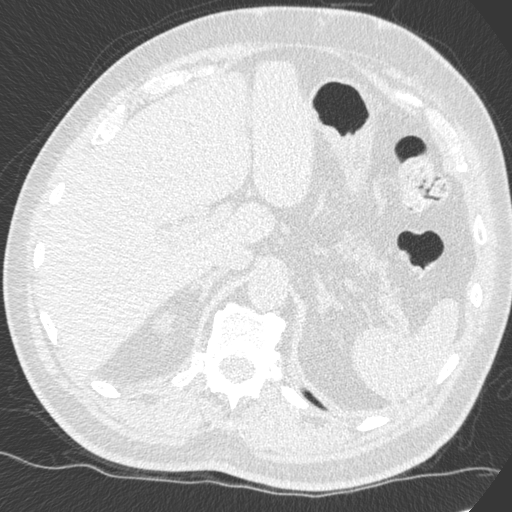
[im 61/387  lung]
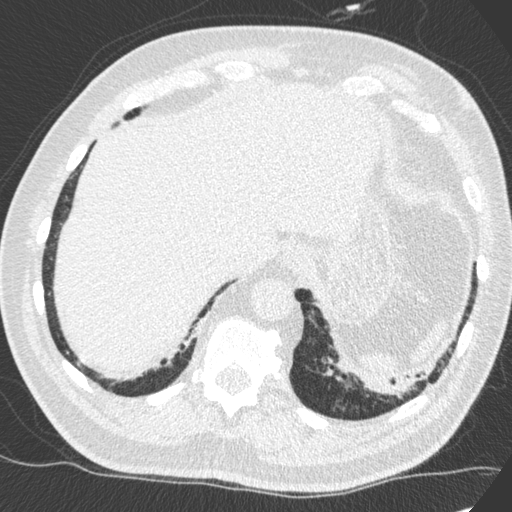
[im 82/387  lung]
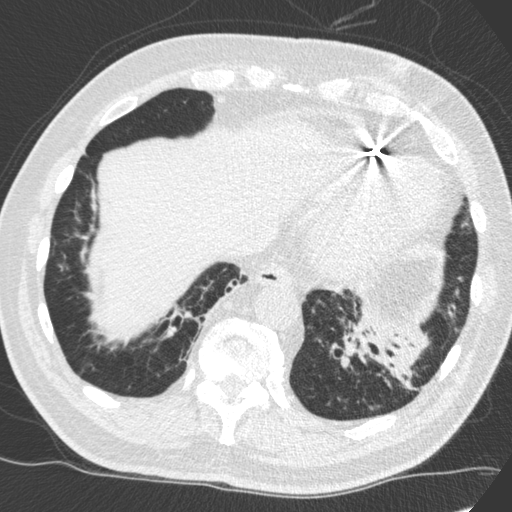
[im 122/387  lung]
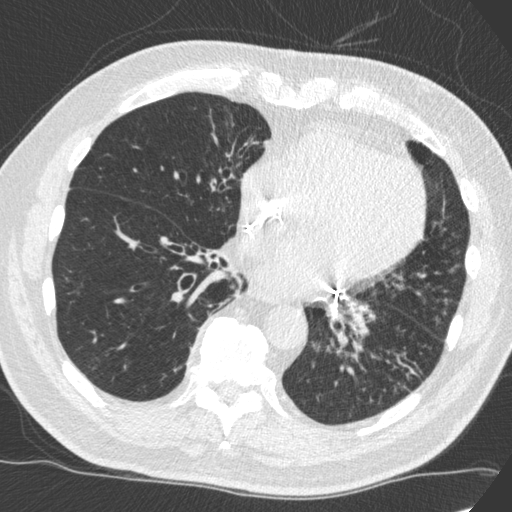
[im 143/387  mediastinal]
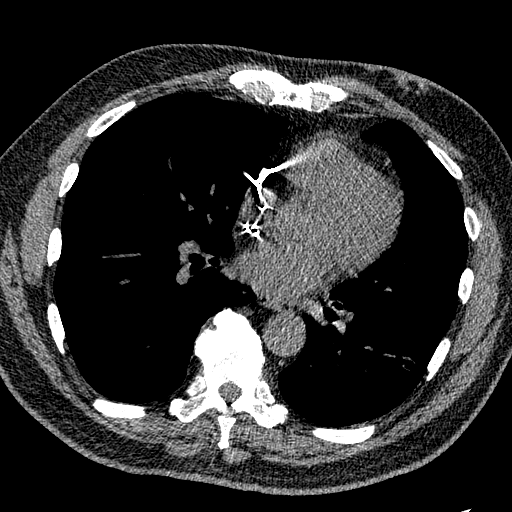
[im 143/387  lung]
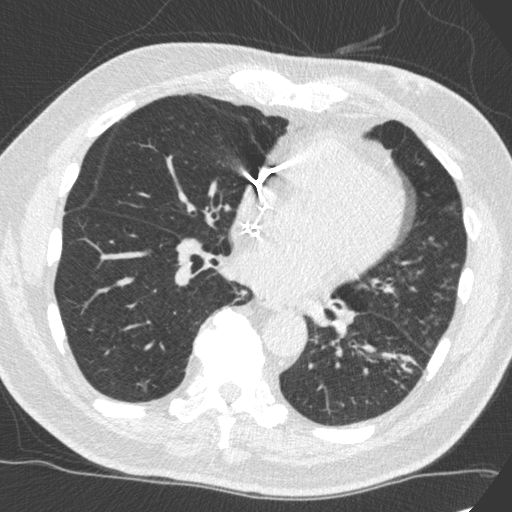
[im 183/387  lung]
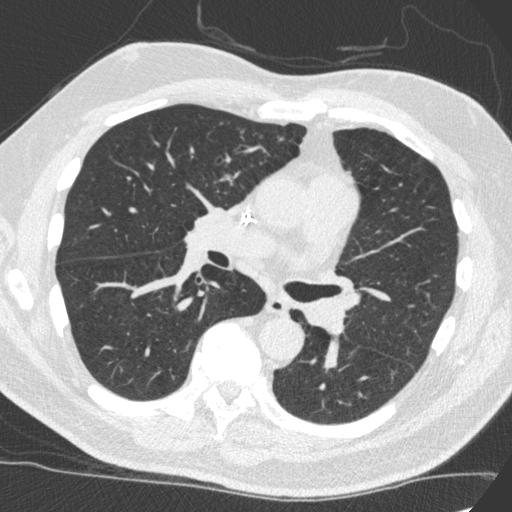
[im 204/387  lung]
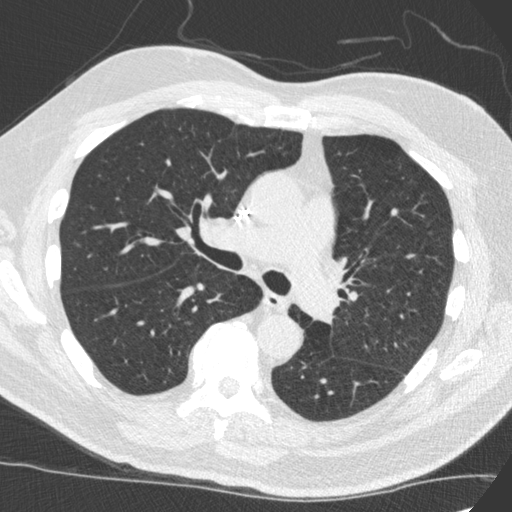
[im 244/387  lung]
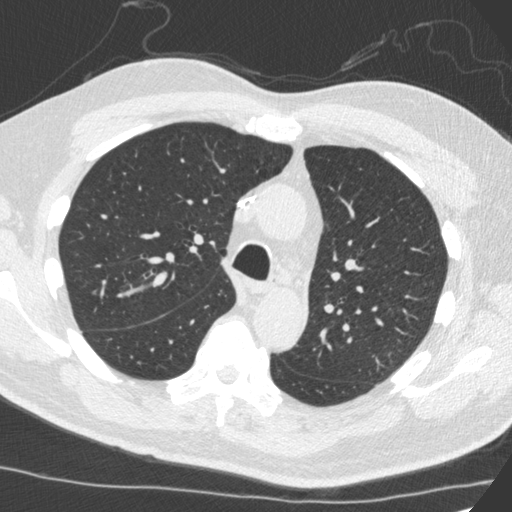
[im 265/387  mediastinal]
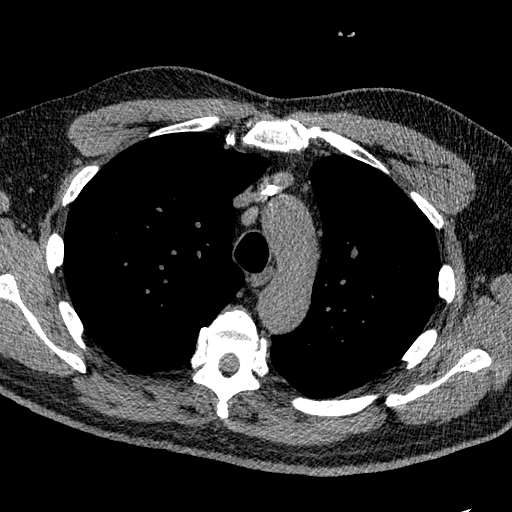
[im 265/387  lung]
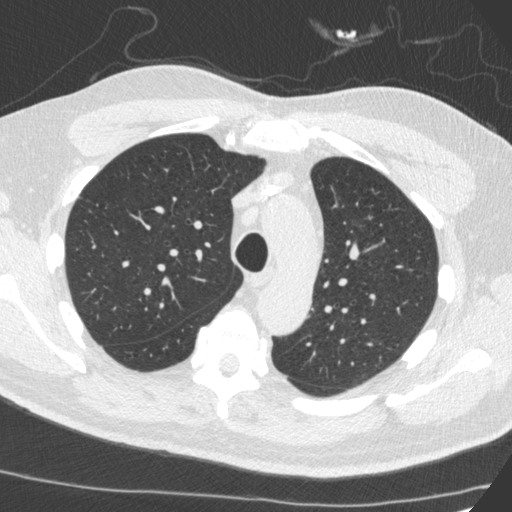
[im 305/387  lung]
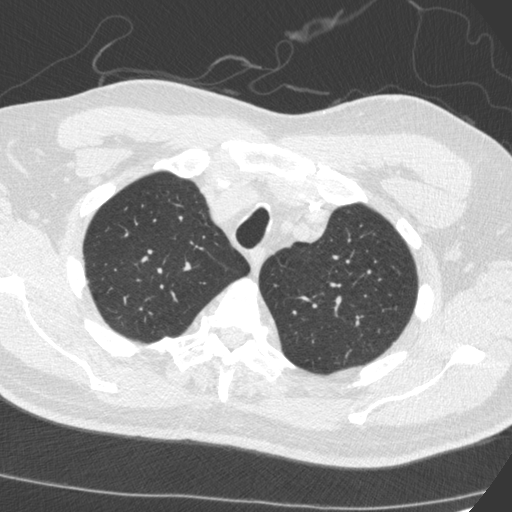
[im 326/387  lung]
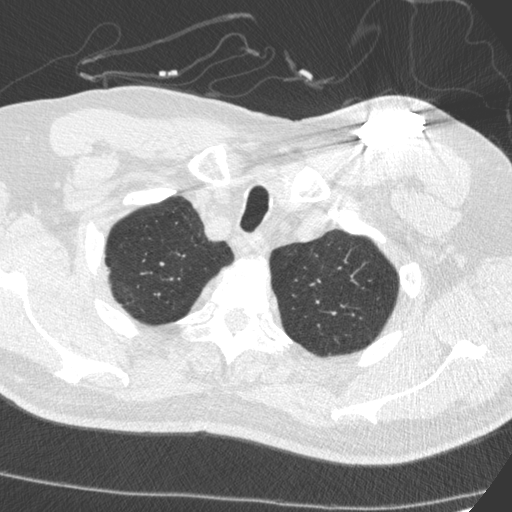
[im 366/387  lung]
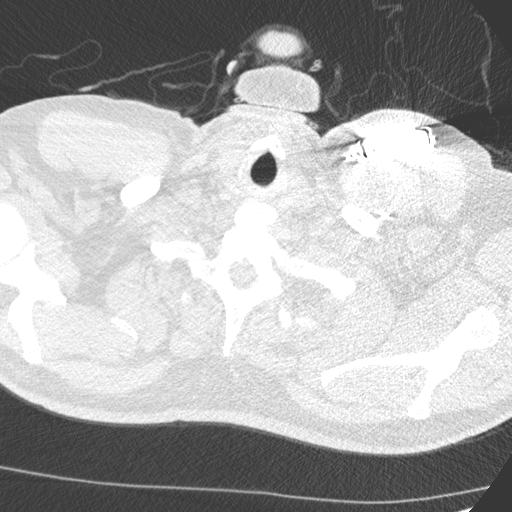

[Series 5: coronal · coronal · 0.61mm/px · 3 of 151 slices shown]
[im 31/151  lung]
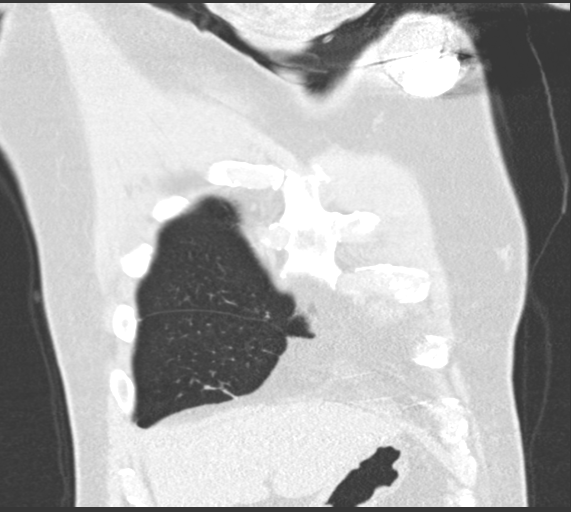
[im 61/151  lung]
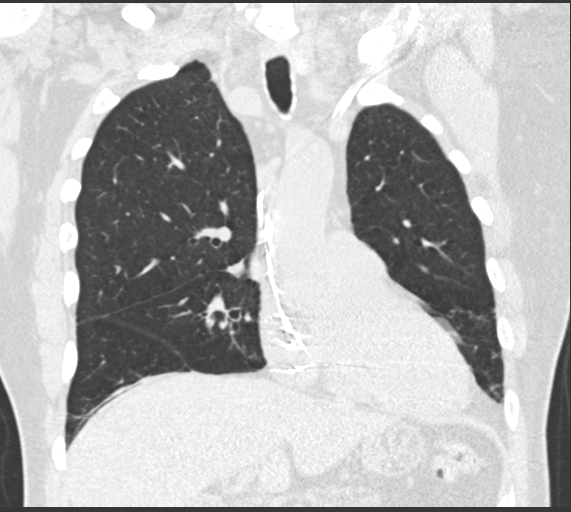
[im 91/151  lung]
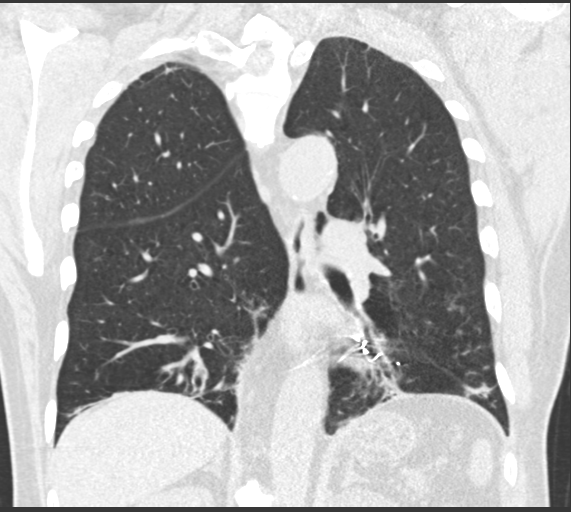

[15 of 36 positions shown; findings below may reference images not displayed]

FINDINGS: Cardiovascular: LEFT-sided dual lead pacer defibrillator in place.
Calcified aortic atherosclerosis. No aneurysmal dilation. Normal
heart size without substantial pericardial effusion. Normal caliber
of central pulmonary vasculature. Limited assessment of
cardiovascular structures given lack of intravenous contrast.

Mediastinum/Nodes: Top-normal AP window lymph node at 11 mm. This
previously measured approximately 14 mm. This measured 8 mm in
[DATE]. (Image 64/2) not currently pathologically enlarged
by CT criteria. No adenopathy in the chest with mildly patulous
esophagus.

Lungs/Pleura: Spiculated nodule along the major fissure in the LEFT
lower lobe (image 95/4) this measures 14 x 13 mm.

Signs of bronchial wall thickening to the lung bases and septal
thickening and ground-glass with similar appearance also associated
with cylindrical bronchiectatic changes. No dense consolidation or
substantial change from recent imaging.

Upper Abdomen: Incidental imaging of upper abdominal contents shows
no acute process.

Musculoskeletal: No suspicious bone lesions. Nodular densities in
the subcutaneous fat of the anterior chest wall. These are unchanged
compared to recent imaging.
IMPRESSION: Spiculated nodule along the major fissure in the LEFT lower lobe
measures 14 x 13 mm. Findings are concerning for neoplasm.

Top-normal AP window lymph node at 11 mm. This showed mild to
moderate increased metabolic activity on recent PET and may
represent ipsilateral nodal involvement.

Signs of bronchial wall thickening and ground-glass with similar
appearance to the lung bases and cylindrical bronchiectatic changes.
Findings related to chronic infection or aspiration.

Subcutaneous fat nodularity as on recent PET and prior CT. Correlate
with any signs of cutaneous lesions.

Aortic atherosclerosis.

## 2021-07-04 ENCOUNTER — Encounter (HOSPITAL_COMMUNITY): Payer: Medicare HMO

## 2021-07-05 ENCOUNTER — Other Ambulatory Visit: Payer: Self-pay | Admitting: Pulmonary Disease

## 2021-07-05 LAB — SARS CORONAVIRUS 2 (TAT 6-24 HRS): SARS Coronavirus 2: NEGATIVE

## 2021-07-06 ENCOUNTER — Encounter (HOSPITAL_COMMUNITY): Payer: Self-pay | Admitting: Pulmonary Disease

## 2021-07-06 NOTE — Progress Notes (Addendum)
PCP Allyn Kenner Cardiologist: Dr. Harl Bowie Electrophysiologist: Crissie Sickles  Last dose Louanna Raw 07/05/2021 Fasting sugars 145-160, checks sugars 3x a day ICD-[facility] Home oxygen 2 liters Clear liquids til 0615 day of surgery  ** PLEASE check your blood sugar the morning of your surgery when you wake up and every 2 hours until you get to the Short Stay unit.  If your blood sugar is less than 70 mg/dL, you will need to treat for low blood sugar: Do not take insulin. Treat a low blood sugar (less than 70 mg/dL) with  cup of clear juice (cranberry or apple), 4 glucose tablets, OR glucose gel. Recheck blood sugar in 15 minutes after treatment (to make sure it is greater than 70 mg/dL). If your blood sugar is not greater than 70 mg/dL on recheck, call [phone] for further instructions.   Notified [facility], spoke with Mickel Baas, gave her pt's date and time for surgery. She stated if we need her support reach out day of surgery.

## 2021-07-08 ENCOUNTER — Encounter: Payer: Self-pay | Admitting: Internal Medicine

## 2021-07-08 NOTE — Anesthesia Preprocedure Evaluation (Addendum)
Anesthesia Evaluation  Patient identified by MRN, date of birth, ID band Patient awake    Reviewed: Allergy & Precautions, NPO status , Patient's Chart, lab work & pertinent test results, reviewed documented beta blocker date and time   Airway Mallampati: III  TM Distance: >3 FB Neck ROM: Limited   Comment: Limited neck extension Dental  (+) Edentulous Upper, Edentulous Lower   Pulmonary shortness of breath, with exertion and at rest, asthma , COPD (2-4LPM  continuous),  COPD inhaler and oxygen dependent, Patient abstained from smoking., former smoker, PE (2016, [facility]- last dose 5d ago) Lung nodule Former smoker, quit 2011, 20 pack year history Inhalers used yesterday    Pulmonary exam normal breath sounds clear to auscultation       Cardiovascular hypertension, Pt. on medications and Pt. on home beta blockers + CAD, + Past MI, +CHF (hx NICM, LVEF has normalized) and + DOE  Normal cardiovascular exam+ Cardiac Defibrillator ( biventricular AICD placed March 2021)  Rhythm:Regular Rate:Normal  history of nonobstructive CAD, and ICM (normalization of EF to 55-60% by echo 08/2020)  Cath 2020: ? Mid LM lesion is 10% stenosed. ? Prox Cx to Mid Cx lesion is 25% stenosed. ? Prox LAD lesion is 25% stenosed. ? There is moderate left ventricular systolic dysfunction. ? There is no aortic valve stenosis. ? Hemodynamic findings consistent with mild pulmonary hypertension. ? Ao sat 98%, PA sat 72%, PA mean 33 mm Hg, PCWP mean 31 mm Hg; CO 5.99 L/min; CI 2.63 Nonobstructive CAD. Continue medical therapy for nonischemic cardiomyopathy.     Echo 2021:  1. Left ventricular ejection fraction, by estimation, is 55 to 60%. The  left ventricle has normal function. The left ventricle has no regional  wall motion abnormalities. There is mild left ventricular hypertrophy.  Left ventricular diastolic parameters  are indeterminate.  2. Right  ventricular systolic function is normal. The right ventricular  size is normal.  3. The mitral valve is normal in structure. Trivial mitral valve  regurgitation. No evidence of mitral stenosis.  4. The aortic valve is tricuspid. There is mild calcification of the  aortic valve. There is mild thickening of the aortic valve. Aortic valve  regurgitation is not visualized. No aortic stenosis is present.  5. The inferior vena cava is normal in size with greater than 50%  respiratory variability, suggesting right atrial pressure of 3 mmHg.    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, last one 2013, no residual weakness), No Residual Symptoms negative psych ROS   GI/Hepatic Neg liver ROS, GERD  Medicated and Controlled,  Endo/Other  diabetes, Well Controlled, Type 2, Insulin Dependenta1c 7.8, FS this AM 97 Insulin 1/2 normal dose last night   Renal/GU CRFRenal disease  negative genitourinary   Musculoskeletal  (+) Arthritis , Osteoarthritis,    Abdominal (+) + obese,   Peds  Hematology negative hematology ROS (+)   Anesthesia Other Findings Chronic percocet   Reproductive/Obstetrics negative OB ROS                          Anesthesia Physical Anesthesia Plan  ASA: 4  Anesthesia Plan: General   Post-op Pain Management:    Induction: Intravenous  PONV Risk Score and Plan: 3 and Ondansetron, Dexamethasone and Treatment may vary due to age or medical condition  Airway Management Planned: Oral ETT  Additional Equipment: None  Intra-op Plan:   Post-operative Plan: Extubation in OR  Informed Consent: I  have reviewed the patients History and Physical, chart, labs and discussed the procedure including the risks, benefits and alternatives for the proposed anesthesia with the patient or authorized representative who has indicated his/her understanding and acceptance.     Dental advisory given  Plan Discussed with: CRNA  Anesthesia Plan  Comments: (Will place magnet over ICD )     Anesthesia Quick Evaluation

## 2021-07-08 NOTE — Progress Notes (Signed)
Anesthesia Chart Review: Same day workup  Follows with cardiology for history of nonobstructive CAD, and ICM (normalization of EF to 55-60% by echo 08/2020), DVT/PE on Xarelto, HTN. LVEF 20-25% in 2020, by 08/2020 echo had normalized to 55-60%.  He had biventricular AICD placed March 2021.  Last seen by cardiology 05/08/2021 during admission for hematemesis, stable at that time from cardiac standpoint and was cleared to undergo any GI procedures that were felt necessary.  Follows with neurology for history of lung cancer s/p left-sided resection, severe COPD, chronic hypoxemic respiratory failure (on continuous O2 2 to 4 L). He is currently on triple therapy inhaler regimen also azithromycin Monday Wednesday Friday.  He now has hypermetabolic enlarging left lower lobe nodule.  Seen by pulmonologist Dr. Valeta Harms 06/12/2021.  Per note, "I will reach out to his cardiologist Dr. Harl Bowie to make sure he did not think there would be any issues holding anticoagulation. I think the patient has enough lung function to consider general anesthesia.  Also looks like he is doing okay from a cardiac standpoint and has had general anesthesia for EGD in the past."  IDDM2, last A1c 7.8 on 05/07/21.  Pt will need DOS labs and eval.  EKG 05/06/21: Sinus tachycardia. Rate 103. Abnormal lateral Q waves. Minimal ST depression, lateral leads  CT chest 07/03/2021: IMPRESSION: Spiculated nodule along the major fissure in the LEFT lower lobe measures 14 x 13 mm. Findings are concerning for neoplasm.   Top-normal AP window lymph node at 11 mm. This showed mild to moderate increased metabolic activity on recent PET and may represent ipsilateral nodal involvement.   Signs of bronchial wall thickening and ground-glass with similar appearance to the lung bases and cylindrical bronchiectatic changes. Findings related to chronic infection or aspiration.   Subcutaneous fat nodularity as on recent PET and prior CT. Correlate with any  signs of cutaneous lesions.   Aortic atherosclerosis.  TTE 08/17/20:  1. Left ventricular ejection fraction, by estimation, is 55 to 60%. The  left ventricle has normal function. The left ventricle has no regional  wall motion abnormalities. There is mild left ventricular hypertrophy.  Left ventricular diastolic parameters  are indeterminate.   2. Right ventricular systolic function is normal. The right ventricular  size is normal.   3. The mitral valve is normal in structure. Trivial mitral valve  regurgitation. No evidence of mitral stenosis.   4. The aortic valve is tricuspid. There is mild calcification of the  aortic valve. There is mild thickening of the aortic valve. Aortic valve  regurgitation is not visualized. No aortic stenosis is present.   5. The inferior vena cava is normal in size with greater than 50%  respiratory variability, suggesting right atrial pressure of 3 mmHg.   R/L Cath 11/08/18: Mid LM lesion is 10% stenosed. Prox Cx to Mid Cx lesion is 25% stenosed. Prox LAD lesion is 25% stenosed. There is moderate left ventricular systolic dysfunction. There is no aortic valve stenosis. Hemodynamic findings consistent with mild pulmonary hypertension. Ao sat 98%, PA sat 72%, PA mean 33 mm Hg, PCWP mean 31 mm Hg; CO 5.99 L/min; CI 2.63   Nonobstructive CAD.   Continue medical therapy for nonischemic cardiomyopathy.     [facility] Short Stay Center/Anesthesiology Phone [phone] 07/08/2021 12:51 PM

## 2021-07-09 ENCOUNTER — Encounter (HOSPITAL_COMMUNITY)
Admission: RE | Disposition: A | Discharge status: Home / Self Care | Payer: Self-pay | Source: Ambulatory Visit | Attending: Pulmonary Disease

## 2021-07-09 ENCOUNTER — Ambulatory Visit (HOSPITAL_COMMUNITY): Payer: Medicare HMO

## 2021-07-09 ENCOUNTER — Ambulatory Visit (HOSPITAL_COMMUNITY): Payer: Medicare HMO | Admitting: Physician Assistant

## 2021-07-09 ENCOUNTER — Ambulatory Visit (HOSPITAL_COMMUNITY)
Admission: RE | Admit: 2021-07-09 | Discharge: 2021-07-09 | Disposition: A | Discharge status: Home / Self Care | Payer: Medicare HMO | Source: Ambulatory Visit | Attending: Pulmonary Disease | Admitting: Pulmonary Disease

## 2021-07-09 ENCOUNTER — Encounter (HOSPITAL_COMMUNITY): Payer: Self-pay | Admitting: Pulmonary Disease

## 2021-07-09 ENCOUNTER — Other Ambulatory Visit: Payer: Self-pay

## 2021-07-09 ENCOUNTER — Encounter (HOSPITAL_COMMUNITY): Payer: Medicare HMO

## 2021-07-09 DIAGNOSIS — I509 Heart failure, unspecified: Secondary | ICD-10-CM | POA: Diagnosis not present

## 2021-07-09 DIAGNOSIS — R590 Localized enlarged lymph nodes: Secondary | ICD-10-CM | POA: Diagnosis not present

## 2021-07-09 DIAGNOSIS — Z419 Encounter for procedure for purposes other than remedying health state, unspecified: Secondary | ICD-10-CM

## 2021-07-09 DIAGNOSIS — Z95810 Presence of automatic (implantable) cardiac defibrillator: Secondary | ICD-10-CM | POA: Insufficient documentation

## 2021-07-09 DIAGNOSIS — I428 Other cardiomyopathies: Secondary | ICD-10-CM | POA: Insufficient documentation

## 2021-07-09 DIAGNOSIS — Z9981 Dependence on supplemental oxygen: Secondary | ICD-10-CM | POA: Insufficient documentation

## 2021-07-09 DIAGNOSIS — I700 Atherosclerosis of aorta: Secondary | ICD-10-CM | POA: Insufficient documentation

## 2021-07-09 DIAGNOSIS — I252 Old myocardial infarction: Secondary | ICD-10-CM | POA: Diagnosis not present

## 2021-07-09 DIAGNOSIS — Z85118 Personal history of other malignant neoplasm of bronchus and lung: Secondary | ICD-10-CM | POA: Insufficient documentation

## 2021-07-09 DIAGNOSIS — J449 Chronic obstructive pulmonary disease, unspecified: Secondary | ICD-10-CM | POA: Insufficient documentation

## 2021-07-09 DIAGNOSIS — R942 Abnormal results of pulmonary function studies: Secondary | ICD-10-CM

## 2021-07-09 DIAGNOSIS — J479 Bronchiectasis, uncomplicated: Secondary | ICD-10-CM | POA: Diagnosis not present

## 2021-07-09 DIAGNOSIS — Z9889 Other specified postprocedural states: Secondary | ICD-10-CM

## 2021-07-09 DIAGNOSIS — R918 Other nonspecific abnormal finding of lung field: Secondary | ICD-10-CM | POA: Insufficient documentation

## 2021-07-09 DIAGNOSIS — N189 Chronic kidney disease, unspecified: Secondary | ICD-10-CM | POA: Insufficient documentation

## 2021-07-09 DIAGNOSIS — J9811 Atelectasis: Secondary | ICD-10-CM | POA: Diagnosis not present

## 2021-07-09 DIAGNOSIS — I2510 Atherosclerotic heart disease of native coronary artery without angina pectoris: Secondary | ICD-10-CM | POA: Insufficient documentation

## 2021-07-09 DIAGNOSIS — Z8673 Personal history of transient ischemic attack (TIA), and cerebral infarction without residual deficits: Secondary | ICD-10-CM | POA: Insufficient documentation

## 2021-07-09 DIAGNOSIS — E119 Type 2 diabetes mellitus without complications: Secondary | ICD-10-CM | POA: Insufficient documentation

## 2021-07-09 DIAGNOSIS — Z87891 Personal history of nicotine dependence: Secondary | ICD-10-CM | POA: Diagnosis not present

## 2021-07-09 DIAGNOSIS — K219 Gastro-esophageal reflux disease without esophagitis: Secondary | ICD-10-CM | POA: Insufficient documentation

## 2021-07-09 DIAGNOSIS — M1990 Unspecified osteoarthritis, unspecified site: Secondary | ICD-10-CM | POA: Insufficient documentation

## 2021-07-09 DIAGNOSIS — I130 Hypertensive heart and chronic kidney disease with heart failure and stage 1 through stage 4 chronic kidney disease, or unspecified chronic kidney disease: Secondary | ICD-10-CM | POA: Insufficient documentation

## 2021-07-09 DIAGNOSIS — J441 Chronic obstructive pulmonary disease with (acute) exacerbation: Secondary | ICD-10-CM | POA: Diagnosis not present

## 2021-07-09 DIAGNOSIS — E7800 Pure hypercholesterolemia, unspecified: Secondary | ICD-10-CM | POA: Diagnosis not present

## 2021-07-09 DIAGNOSIS — J9611 Chronic respiratory failure with hypoxia: Secondary | ICD-10-CM | POA: Insufficient documentation

## 2021-07-09 DIAGNOSIS — R911 Solitary pulmonary nodule: Secondary | ICD-10-CM | POA: Insufficient documentation

## 2021-07-09 HISTORY — DX: Dyspnea, unspecified: R06.00

## 2021-07-09 HISTORY — PX: VIDEO BRONCHOSCOPY WITH ENDOBRONCHIAL NAVIGATION: SHX6175

## 2021-07-09 HISTORY — PX: BRONCHIAL BRUSHINGS: SHX5108

## 2021-07-09 HISTORY — PX: BRONCHIAL BIOPSY: SHX5109

## 2021-07-09 HISTORY — DX: Angina pectoris, unspecified: I20.9

## 2021-07-09 HISTORY — PX: FIDUCIAL MARKER PLACEMENT: SHX6858

## 2021-07-09 HISTORY — DX: Presence of automatic (implantable) cardiac defibrillator: Z95.810

## 2021-07-09 HISTORY — PX: BRONCHIAL NEEDLE ASPIRATION BIOPSY: SHX5106

## 2021-07-09 LAB — BASIC METABOLIC PANEL
Anion gap: 7 (ref 5–15)
BUN: 7 mg/dL — ABNORMAL LOW (ref 8–23)
CO2: 27 mmol/L (ref 22–32)
Calcium: 9.2 mg/dL (ref 8.9–10.3)
Chloride: 101 mmol/L (ref 98–111)
Creatinine, Ser: 1.22 mg/dL (ref 0.61–1.24)
GFR, Estimated: >60 mL/min (ref >60)
Glucose, Bld: 125 mg/dL — ABNORMAL HIGH (ref 70–99)
Potassium: 4.5 mmol/L (ref 3.5–5.1)
Sodium: 135 mmol/L (ref 135–145)

## 2021-07-09 LAB — GLUCOSE, CAPILLARY
Glucose-Capillary: 97 mg/dL (ref 70–99)
Glucose-Capillary: 112 mg/dL — ABNORMAL HIGH (ref 70–99)
Glucose-Capillary: 126 mg/dL — ABNORMAL HIGH (ref 70–99)

## 2021-07-09 IMAGING — DX DG CHEST 1V PORT
1 series · 1 of 1 positions shown · non-contrast
Comparison: Chest x-ray dated [DATE].

CLINICAL DATA: Status post bronchoscopy.

EXAM:
PORTABLE CHEST 1 VIEW

[chest]
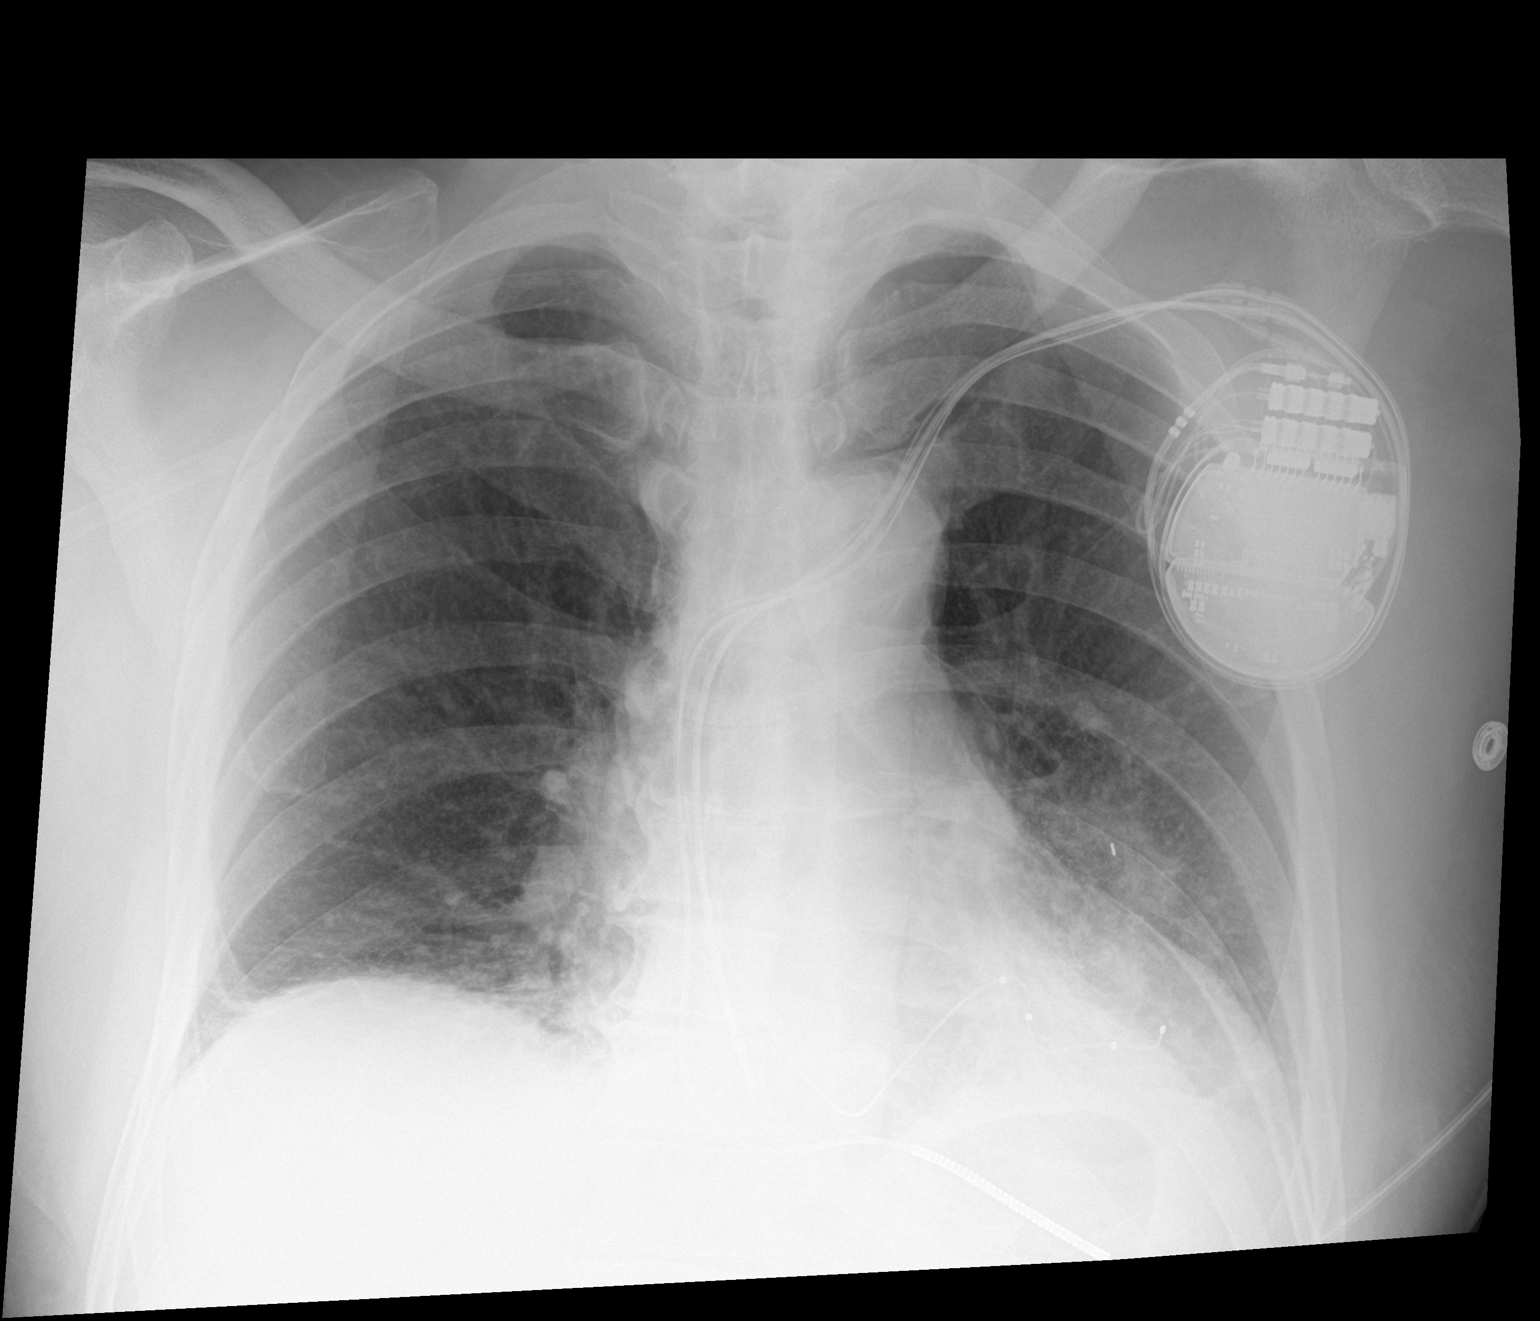

[1 of 1 positions shown; findings below may reference images not displayed]

FINDINGS: Unchanged left chest wall AICD. Normal heart size. Low lung volumes
with mild bibasilar atelectasis superimposed on chronic left greater
than right lower lobe bronchiectasis and scarring. New fiducial
marker in the left mid to lower lung with mild surrounding opacity.
No pleural effusion or pneumothorax. No acute osseous abnormality.
IMPRESSION: 1. New fiducial marker in the left mid to lower lung with mild
surrounding opacity, nonspecific, but potentially a small amount of
post-procedure hemorrhage. No pneumothorax.

## 2021-07-09 SURGERY — VIDEO BRONCHOSCOPY WITH ENDOBRONCHIAL NAVIGATION
Anesthesia: General | Laterality: Left

## 2021-07-09 MED ORDER — PHENYLEPHRINE HCL-NACL 20-0.9 MG/250ML-% IV SOLN
INTRAVENOUS | Status: DC | PRN
  Administered 2021-07-09: 50 ug/min via INTRAVENOUS

## 2021-07-09 MED ORDER — ONDANSETRON HCL 4 MG/2ML IJ SOLN
INTRAMUSCULAR | Status: DC | PRN
  Administered 2021-07-09: 4 mg via INTRAVENOUS

## 2021-07-09 MED ORDER — SUCCINYLCHOLINE CHLORIDE 200 MG/10ML IV SOSY
PREFILLED_SYRINGE | INTRAVENOUS | Status: DC | PRN
  Administered 2021-07-09: 120 mg via INTRAVENOUS

## 2021-07-09 MED ORDER — FENTANYL CITRATE (PF) 100 MCG/2ML IJ SOLN: 25.0000 ug | INTRAMUSCULAR | Status: DC | PRN

## 2021-07-09 MED ORDER — DEXAMETHASONE SODIUM PHOSPHATE 10 MG/ML IJ SOLN
INTRAMUSCULAR | Status: DC | PRN
Start: 2021-07-09 — End: 2021-07-09
  Administered 2021-07-09: 10 mg via INTRAVENOUS

## 2021-07-09 MED ORDER — PHENYLEPHRINE 40 MCG/ML (10ML) SYRINGE FOR IV PUSH (FOR BLOOD PRESSURE SUPPORT)
PREFILLED_SYRINGE | INTRAVENOUS | Status: DC | PRN
  Administered 2021-07-09 (×3): 80 ug via INTRAVENOUS

## 2021-07-09 MED ORDER — ROCURONIUM BROMIDE 10 MG/ML (PF) SYRINGE
PREFILLED_SYRINGE | INTRAVENOUS | Status: DC | PRN
  Administered 2021-07-09: 40 mg via INTRAVENOUS
  Administered 2021-07-09: 10 mg via INTRAVENOUS

## 2021-07-09 MED ORDER — SUGAMMADEX SODIUM 200 MG/2ML IV SOLN
INTRAVENOUS | Status: DC | PRN
  Administered 2021-07-09: 200 mg via INTRAVENOUS

## 2021-07-09 MED ORDER — CARVEDILOL 12.5 MG PO TABS
25.0000 mg | ORAL_TABLET | Freq: Once | ORAL | Status: AC
  Administered 2021-07-09: 25 mg via ORAL
  Filled 2021-07-09: qty 2

## 2021-07-09 MED ORDER — LIDOCAINE 2% (20 MG/ML) 5 ML SYRINGE
INTRAMUSCULAR | Status: DC | PRN
  Administered 2021-07-09: 60 mg via INTRAVENOUS

## 2021-07-09 MED ORDER — PROPOFOL 10 MG/ML IV BOLUS
INTRAVENOUS | Status: DC | PRN
  Administered 2021-07-09: 150 mg via INTRAVENOUS

## 2021-07-09 MED ORDER — MIDAZOLAM HCL 2 MG/2ML IJ SOLN
INTRAMUSCULAR | Status: DC | PRN
  Administered 2021-07-09: 2 mg via INTRAVENOUS

## 2021-07-09 MED ORDER — CHLORHEXIDINE GLUCONATE 0.12 % MT SOLN
15.0000 mL | Freq: Once | OROMUCOSAL | Status: AC
  Administered 2021-07-09: 15 mL via OROMUCOSAL
  Filled 2021-07-09: qty 15

## 2021-07-09 MED ORDER — FENTANYL CITRATE (PF) 100 MCG/2ML IJ SOLN
INTRAMUSCULAR | Status: DC | PRN
  Administered 2021-07-09: 100 ug via INTRAVENOUS

## 2021-07-09 MED ORDER — ONDANSETRON HCL 4 MG/2ML IJ SOLN: 4.0000 mg | Freq: Once | INTRAMUSCULAR | Status: DC | PRN

## 2021-07-09 MED ORDER — LACTATED RINGERS IV SOLN: INTRAVENOUS | Status: DC

## 2021-07-09 SURGICAL SUPPLY — 46 items
ADAPTER BRONCH F/PENTAX (ADAPTER) ×4 IMPLANT
ADAPTER VALVE BIOPSY EBUS (MISCELLANEOUS) IMPLANT
ADPTR VALVE BIOPSY EBUS (MISCELLANEOUS)
BRUSH CYTOL CELLEBRITY 1.5X140 (MISCELLANEOUS) ×4 IMPLANT
BRUSH SUPERTRAX BIOPSY (INSTRUMENTS) IMPLANT
BRUSH SUPERTRAX NDL-TIP CYTO (INSTRUMENTS) ×4 IMPLANT
CANISTER SUCT 3000ML PPV (MISCELLANEOUS) ×4 IMPLANT
CHANNEL WORK EXTEND EDGE 180 (KITS) IMPLANT
CHANNEL WORK EXTEND EDGE 45 (KITS) IMPLANT
CHANNEL WORK EXTEND EDGE 90 (KITS) IMPLANT
CONT SPEC 4OZ CLIKSEAL STRL BL (MISCELLANEOUS) ×4 IMPLANT
COVER BACK TABLE 60X90IN (DRAPES) ×4 IMPLANT
FILTER STRAW FLUID ASPIR (MISCELLANEOUS) IMPLANT
FORCEPS BIOP SUPERTRX PREMAR (INSTRUMENTS) ×4 IMPLANT
GAUZE SPONGE 4X4 12PLY STRL (GAUZE/BANDAGES/DRESSINGS) ×4 IMPLANT
GLOVE SURG SS PI 7.5 STRL IVOR (GLOVE) ×8 IMPLANT
GOWN STRL REUS W/ TWL LRG LVL3 (GOWN DISPOSABLE) ×4 IMPLANT
GOWN STRL REUS W/TWL LRG LVL3 (GOWN DISPOSABLE) ×4
KIT CLEAN ENDO COMPLIANCE (KITS) ×4 IMPLANT
KIT LOCATABLE GUIDE (CANNULA) IMPLANT
KIT MARKER FIDUCIAL DELIVERY (KITS) IMPLANT
KIT PROCEDURE EDGE 180 (KITS) IMPLANT
KIT PROCEDURE EDGE 45 (KITS) IMPLANT
KIT PROCEDURE EDGE 90 (KITS) IMPLANT
KIT TURNOVER KIT B (KITS) ×4 IMPLANT
MARKER SKIN DUAL TIP RULER LAB (MISCELLANEOUS) ×4 IMPLANT
NEEDLE SUPERTRX PREMARK BIOPSY (NEEDLE) ×4 IMPLANT
NS IRRIG 1000ML POUR BTL (IV SOLUTION) ×4 IMPLANT
OIL SILICONE PENTAX (PARTS (SERVICE/REPAIRS)) ×4 IMPLANT
PAD ARMBOARD 7.5X6 YLW CONV (MISCELLANEOUS) ×8 IMPLANT
PATCHES PATIENT (LABEL) ×12 IMPLANT
SOL ANTI FOG 6CC (MISCELLANEOUS) ×2 IMPLANT
SOLUTION ANTI FOG 6CC (MISCELLANEOUS) ×2
SYR 20CC LL (SYRINGE) ×4 IMPLANT
SYR 20ML ECCENTRIC (SYRINGE) ×4 IMPLANT
SYR 50ML SLIP (SYRINGE) ×4 IMPLANT
TOWEL OR 17X24 6PK STRL BLUE (TOWEL DISPOSABLE) ×4 IMPLANT
TRAP SPECIMEN MUCOUS 40CC (MISCELLANEOUS) IMPLANT
TUBE CONNECTING 20'X1/4 (TUBING) ×1
TUBE CONNECTING 20X1/4 (TUBING) ×3 IMPLANT
UNDERPAD 30X30 (UNDERPADS AND DIAPERS) ×4 IMPLANT
VALVE BIOPSY  SINGLE USE (MISCELLANEOUS) ×2
VALVE BIOPSY SINGLE USE (MISCELLANEOUS) ×2 IMPLANT
VALVE SUCTION BRONCHIO DISP (MISCELLANEOUS) ×4 IMPLANT
WATER STERILE IRR 1000ML POUR (IV SOLUTION) ×4 IMPLANT
superlock fiducial marker ×4 IMPLANT

## 2021-07-09 NOTE — Anesthesia Postprocedure Evaluation (Signed)
Anesthesia Post Note  Patient: Roger Lowe.  Procedure(s) Performed: VIDEO BRONCHOSCOPY WITH ENDOBRONCHIAL NAVIGATION (Left) BRONCHIAL BIOPSIES BRONCHIAL BRUSHINGS BRONCHIAL NEEDLE ASPIRATION BIOPSIES FIDUCIAL MARKER PLACEMENT     Patient location during evaluation: PACU Anesthesia Type: General Level of consciousness: awake and alert Pain management: pain level controlled Vital Signs Assessment: post-procedure vital signs reviewed and stable Respiratory status: spontaneous breathing, nonlabored ventilation, respiratory function stable and patient connected to nasal cannula oxygen Cardiovascular status: blood pressure returned to baseline and stable Postop Assessment: no apparent nausea or vomiting Anesthetic complications: no   No notable events documented.  Last Vitals:  Vitals:   07/09/21 1120 07/09/21 1135  BP: 125/76 (!) 104/55  Pulse: 85 90  Resp: 17 16  Temp: 36.5 C   SpO2: 93% 97%    Last Pain:  Vitals:   07/09/21 1120  TempSrc:   PainSc: Asleep                 Belenda Cruise P George Alcantar

## 2021-07-09 NOTE — Discharge Instructions (Signed)
Flexible Bronchoscopy, Care After This sheet gives you information about how to care for yourself after your test. Your doctor may also give you more specific instructions. If you have problems or questions, contact your doctor. Follow these instructions at home: Eating and drinking Do not eat or drink anything (not even water) for 2 hours after your test, or until your numbing medicine (local anesthetic) wears off. When your numbness is gone and your cough and gag reflexes have come back, you may: Eat only soft foods. Slowly drink liquids. The day after the test, go back to your normal diet. Driving Do not drive for 24 hours if you were given a medicine to help you relax (sedative). Do not drive or use heavy machinery while taking prescription pain medicine. General instructions  Take over-the-counter and prescription medicines only as told by your doctor. Return to your normal activities as told. Ask what activities are safe for you. Do not use any products that have nicotine or tobacco in them. This includes cigarettes and e-cigarettes. If you need help quitting, ask your doctor. Keep all follow-up visits as told by your doctor. This is important. It is very important if you had a tissue sample (biopsy) taken. Get help right away if: You have shortness of breath that gets worse. You get light-headed. You feel like you are going to pass out (faint). You have chest pain. You cough up: More than a little blood. More blood than before. Summary Do not eat or drink anything (not even water) for 2 hours after your test, or until your numbing medicine wears off. Do not use cigarettes. Do not use e-cigarettes. Get help right away if you have chest pain.  This information is not intended to replace advice given to you by your health care provider. Make sure you discuss any questions you have with your health care provider. Document Released: 06/15/2009 Document Revised: 07/31/2017 Document  Reviewed: 09/05/2016 Elsevier Patient Education  2020 [employer].

## 2021-07-09 NOTE — Progress Notes (Signed)
Device sheet not back from Cardiologist. Notified Dr. Tawanna Cooler. No new orders.

## 2021-07-09 NOTE — Transfer of Care (Signed)
Immediate Anesthesia Transfer of Care Note  Patient: Roger Lowe.  Procedure(s) Performed: VIDEO BRONCHOSCOPY WITH ENDOBRONCHIAL NAVIGATION (Left) BRONCHIAL BIOPSIES BRONCHIAL BRUSHINGS BRONCHIAL NEEDLE ASPIRATION BIOPSIES FIDUCIAL MARKER PLACEMENT  Patient Location: PACU  Anesthesia Type:General  Level of Consciousness: drowsy and patient cooperative  Airway & Oxygen Therapy: Patient Spontanous Breathing and Patient connected to nasal cannula oxygen  Post-op Assessment: Report given to RN, Post -op Vital signs reviewed and stable and Patient moving all extremities  Post vital signs: Reviewed and stable  Last Vitals:  Vitals Value Taken Time  BP 125/76 07/09/21 1119  Temp    Pulse 87 07/09/21 1120  Resp 17 07/09/21 1120  SpO2 94 % 07/09/21 1120  Vitals shown include unvalidated device data.  Last Pain:  Vitals:   07/09/21 0744  TempSrc:   PainSc: 0-No pain         Complications: No notable events documented.

## 2021-07-09 NOTE — Interval H&P Note (Signed)
History and Physical Interval Note:  07/09/2021 10:02 AM  Roger Lowe.  has presented today for surgery, with the diagnosis of lung nodule.  The various methods of treatment have been discussed with the patient and family. After consideration of risks, benefits and other options for treatment, the patient has consented to  Procedure(s) with comments: [place] (Left) - ION w/ fiducial placement as a surgical intervention.  The patient's history has been reviewed, patient examined, no change in status, stable for surgery.  I have reviewed the patient's chart and labs.  Questions were answered to the patient's satisfaction.     [place]

## 2021-07-09 NOTE — Anesthesia Procedure Notes (Signed)
Procedure Name: Intubation Date/Time: 07/09/2021 10:14 AM Performed by: Moshe Salisbury, CRNA Pre-anesthesia Checklist: Patient identified, Emergency Drugs available, Suction available and Patient being monitored Patient Re-evaluated:Patient Re-evaluated prior to induction Oxygen Delivery Method: Circle System Utilized Preoxygenation: Pre-oxygenation with 100% oxygen Induction Type: IV induction Ventilation: Mask ventilation without difficulty Laryngoscope Size: Mac and 4 Grade View: Grade I Tube type: Oral Tube size: 8.5 mm Number of attempts: 1 Airway Equipment and Method: Stylet Placement Confirmation: ETT inserted through vocal cords under direct vision, positive ETCO2 and breath sounds checked- equal and bilateral Secured at: 21 cm Tube secured with: Tape Dental Injury: Teeth and Oropharynx as per pre-operative assessment

## 2021-07-09 NOTE — Op Note (Signed)
Video Bronchoscopy with Robotic Assisted Bronchoscopic Navigation   Date of Operation: 07/09/2021   Pre-op Diagnosis: LLL Lung nodule   Post-op Diagnosis: LLL Lung nodule   Surgeon: Garner Nash, DO   Assistants: None   Anesthesia: General endotracheal anesthesia  Operation: Flexible video fiberoptic bronchoscopy with robotic assistance and biopsies.  Estimated Blood Loss: Minimal  Complications: None  Indications and History: Roger Lowe. is a 75 y.o. male with history of pulmonary nodule left lower lobe. The risks, benefits, complications, treatment options and expected outcomes were discussed with the patient.  The possibilities of pneumothorax, pneumonia, reaction to medication, pulmonary aspiration, perforation of a viscus, bleeding, failure to diagnose a condition and creating a complication requiring transfusion or operation were discussed with the patient who freely signed the consent.    Description of Procedure: The patient was seen in the Preoperative Area, was examined and was deemed appropriate to proceed.  The patient was taken to [facility] endoscopy room 3, identified as Roger Lowe. and the procedure verified as Flexible Video Fiberoptic Bronchoscopy.  A Time Out was held and the above information confirmed.   Prior to the date of the procedure a high-resolution CT scan of the chest was performed. Utilizing ION software program a virtual tracheobronchial tree was generated to allow the creation of distinct navigation pathways to the patient's parenchymal abnormalities. After being taken to the operating room general anesthesia was initiated and the patient  was orally intubated. The video fiberoptic bronchoscope was introduced via the endotracheal tube and a general inspection was performed which showed normal right and left lung anatomy, aspiration of the bilateral mainstems was completed to remove any remaining secretions. Robotic catheter inserted into patient's endotracheal  tube.   Target #1 left lower lobe pulmonary nodule: The distinct navigation pathways prepared prior to this procedure were then utilized to navigate to patient's lesion identified on CT scan. The robotic catheter was secured into place and the vision probe was withdrawn.  Lesion location was approximated using fluoroscopy and radial endobronchial ultrasound for peripheral targeting.  Lesion was identified with 3D cone beam CT imaging and catheter was articulated for 2 lung lesion confirmation under direct imaging. Under fluoroscopic guidance transbronchial needle brushings for cytology as well as microbiology, transbronchial needle biopsies, and transbronchial forceps biopsies were performed to be sent for cytology and pathology.  Following tissue sampling a single fiducial was placed under fluoroscopic guidance using a fiducial catheter wire and delivery kit. A bronchioalveolar lavage was performed in the left lower lobe and sent for culture. Patient has a very small 57m AP window node that was not visible under EBUS.   At the end of the procedure a general airway inspection was performed and there was no evidence of active bleeding. The bronchoscope was removed.  The patient tolerated the procedure well. There was no significant blood loss and there were no obvious complications. A post-procedural chest x-ray is pending.  Samples Target #1: 1. Transbronchial needle brushings from left lower lobe 2. Transbronchial Wang needle biopsies from left lower lobe 3. Transbronchial forceps biopsies from left lower lobe 4. Bronchoalveolar lavage from left lower lobe  Plans:  The patient will be discharged from the PACU to home when recovered from anesthesia and after chest x-ray is reviewed. We will review the cytology, pathology and microbiology results with the patient when they become available. Outpatient followup will be with BGarner Nash DO.  BGarner Nash DO [facility] 07/09/2021 11:34 AM

## 2021-07-10 ENCOUNTER — Encounter (HOSPITAL_COMMUNITY): Payer: Self-pay | Admitting: Pulmonary Disease

## 2021-07-10 LAB — ACID FAST SMEAR (AFB, MYCOBACTERIA): Acid Fast Smear: NEGATIVE

## 2021-07-11 ENCOUNTER — Encounter (HOSPITAL_COMMUNITY): Payer: Medicare HMO

## 2021-07-11 LAB — CYTOLOGY - NON PAP

## 2021-07-14 DIAGNOSIS — E1065 Type 1 diabetes mellitus with hyperglycemia: Secondary | ICD-10-CM | POA: Diagnosis not present

## 2021-07-14 DIAGNOSIS — J9601 Acute respiratory failure with hypoxia: Secondary | ICD-10-CM | POA: Diagnosis not present

## 2021-07-14 DIAGNOSIS — I5022 Chronic systolic (congestive) heart failure: Secondary | ICD-10-CM | POA: Diagnosis not present

## 2021-07-14 DIAGNOSIS — I5032 Chronic diastolic (congestive) heart failure: Secondary | ICD-10-CM | POA: Diagnosis not present

## 2021-07-14 LAB — AEROBIC/ANAEROBIC CULTURE W GRAM STAIN (SURGICAL/DEEP WOUND): Culture: NO GROWTH

## 2021-07-15 LAB — MYCOBACTERIA,CULT W/FLUOROCHROME SMEAR
MICRO NUMBER:: 12444745
MICRO NUMBER:: 12444746
SMEAR:: NONE SEEN
SMEAR:: NONE SEEN
SPECIMEN QUALITY:: ADEQUATE
SPECIMEN QUALITY:: ADEQUATE

## 2021-07-16 ENCOUNTER — Inpatient Hospital Stay (HOSPITAL_COMMUNITY): Payer: Medicare HMO

## 2021-07-16 ENCOUNTER — Ambulatory Visit (HOSPITAL_COMMUNITY): Payer: Medicare HMO | Admitting: Hematology

## 2021-07-17 ENCOUNTER — Other Ambulatory Visit: Payer: Self-pay

## 2021-07-17 ENCOUNTER — Ambulatory Visit (INDEPENDENT_AMBULATORY_CARE_PROVIDER_SITE_OTHER): Payer: Medicare HMO | Admitting: Pulmonary Disease

## 2021-07-17 ENCOUNTER — Encounter: Payer: Self-pay | Admitting: Pulmonary Disease

## 2021-07-17 VITALS — BP 140/100 | HR 102 | Temp 97.7°F | Ht 72.0 in | Wt 232.6 lb

## 2021-07-17 DIAGNOSIS — A318 Other mycobacterial infections: Secondary | ICD-10-CM | POA: Diagnosis not present

## 2021-07-17 DIAGNOSIS — J9611 Chronic respiratory failure with hypoxia: Secondary | ICD-10-CM

## 2021-07-17 DIAGNOSIS — R911 Solitary pulmonary nodule: Secondary | ICD-10-CM

## 2021-07-17 DIAGNOSIS — I5022 Chronic systolic (congestive) heart failure: Secondary | ICD-10-CM

## 2021-07-17 DIAGNOSIS — Z85118 Personal history of other malignant neoplasm of bronchus and lung: Secondary | ICD-10-CM | POA: Diagnosis not present

## 2021-07-17 DIAGNOSIS — R942 Abnormal results of pulmonary function studies: Secondary | ICD-10-CM

## 2021-07-17 DIAGNOSIS — J449 Chronic obstructive pulmonary disease, unspecified: Secondary | ICD-10-CM | POA: Diagnosis not present
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
## 2021-07-17 NOTE — Patient Instructions (Signed)
Thank you for visiting Dr. Valeta Harms at [facility]. Today we recommend the following:  Orders Placed This Encounter  Procedures   CT Super D Chest Wo Contrast   Repeat CT scan of the chest in 3 months. We will call you and let you know if there is any change in your culture results that are still pending from the lab.  Return in about 3 months (around 10/17/2021) for with Eric Form, NP, or Dr. Valeta Harms.    Please do your part to reduce the spread of COVID-19.

## 2021-07-22 ENCOUNTER — Ambulatory Visit (HOSPITAL_COMMUNITY)
Admission: RE | Admit: 2021-07-22 | Discharge: 2021-07-22 | Disposition: A | Discharge status: Home / Self Care | Payer: Medicare HMO | Source: Ambulatory Visit | Attending: Hematology | Admitting: Hematology

## 2021-07-22 ENCOUNTER — Other Ambulatory Visit: Payer: Self-pay

## 2021-07-22 DIAGNOSIS — R410 Disorientation, unspecified: Secondary | ICD-10-CM | POA: Diagnosis not present

## 2021-07-22 DIAGNOSIS — G319 Degenerative disease of nervous system, unspecified: Secondary | ICD-10-CM | POA: Diagnosis not present

## 2021-07-22 DIAGNOSIS — C3490 Malignant neoplasm of unspecified part of unspecified bronchus or lung: Secondary | ICD-10-CM | POA: Insufficient documentation

## 2021-07-22 IMAGING — MR MR HEAD WO/W CM
10 of 13 series · 34 of 48 positions shown · IV contrast (Yes   MULTIHANCE)
Comparison: MR head [DATE].

CLINICAL DATA: Non-small cell lung cancer, pre-treatment staging.
Disoriented and confused at times.

EXAM:
MRI HEAD WITHOUT AND WITH CONTRAST
TECHNIQUE: Multiplanar, multiecho pulse sequences of the brain and surrounding
structures were obtained without and with intravenous contrast.
CONTRAST:  10mL GADAVIST GADOBUTROL 1 MMOL/ML IV SOLN

[Series 3: DWI · axial · 3.0mm · 1.09mm/px · z∈[-23,+135]mm · 8 of 108 slices shown (1 of 4)]
[im 1/108]
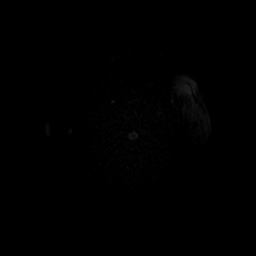
[im 16/108]
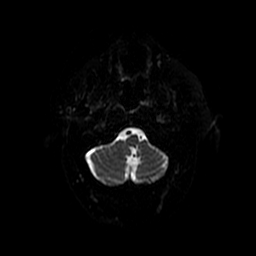
[im 31/108]
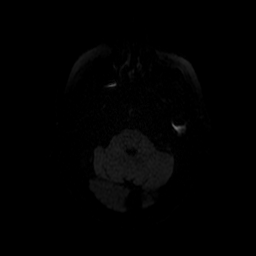
[im 46/108]
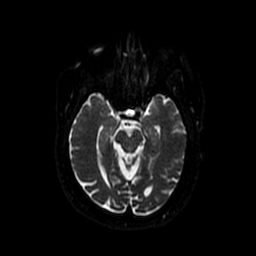
[im 62/108]
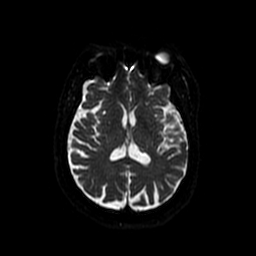
[im 77/108]
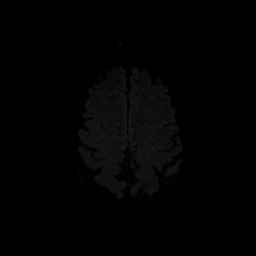
[im 92/108]
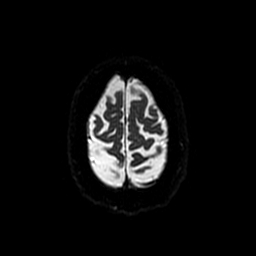
[im 108/108]
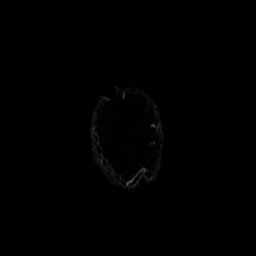

[Series 4: DWI · coronal · 5.0mm · 1.09mm/px · 6 of 74 slices shown (2 of 4)]
[im 1/74]
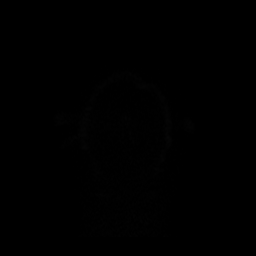
[im 15/74]
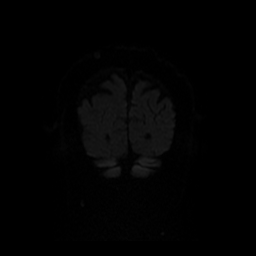
[im 30/74]
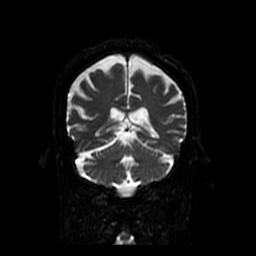
[im 44/74]
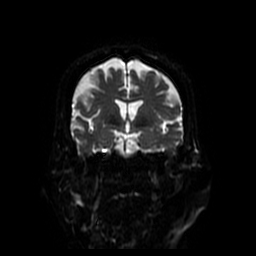
[im 59/74]
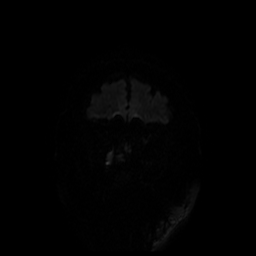
[im 74/74]
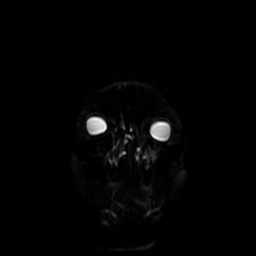

[Series 6: T2 · axial · 5.0mm · 0.47mm/px · z∈[-32,+130]mm · 2 of 28 slices shown]
[im 1/28]
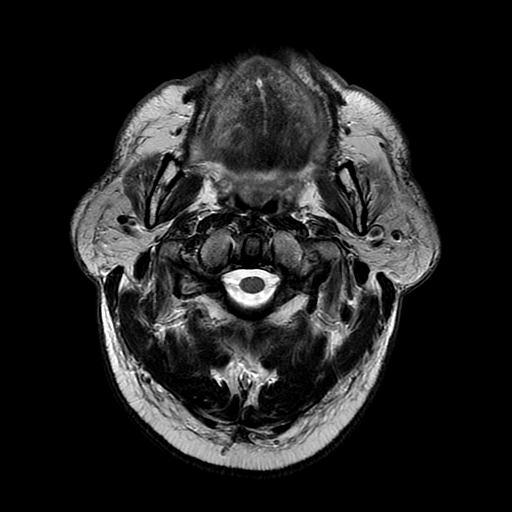
[im 28/28]
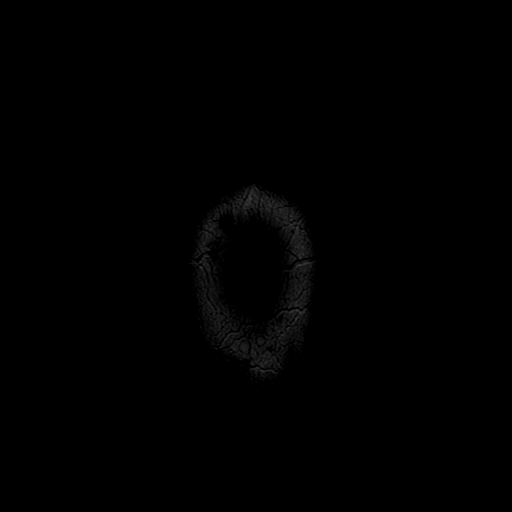

[Series 7: FLAIR · axial · 3.0mm · 0.47mm/px · z∈[-32,+130]mm · 2 of 28 slices shown]
[im 1/28]
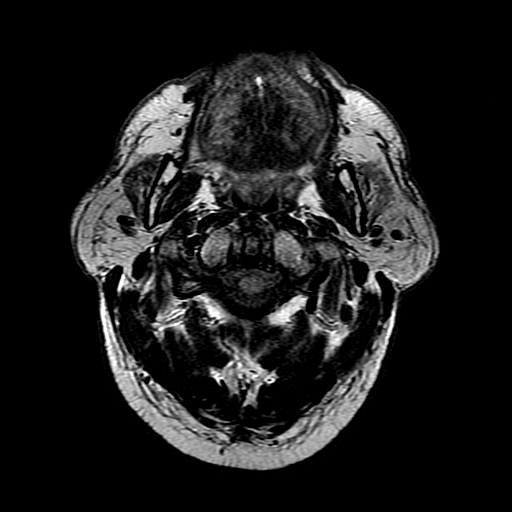
[im 28/28]
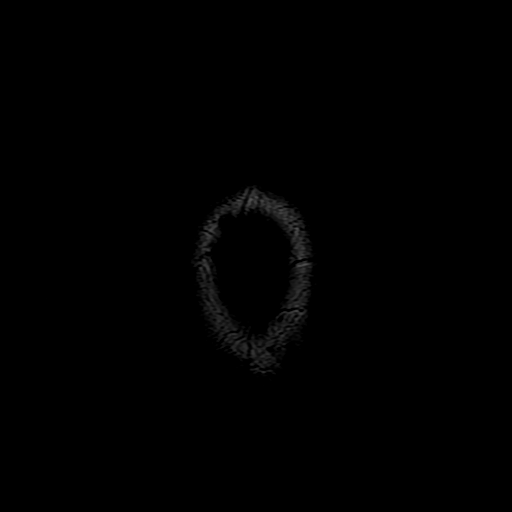

[Series 10: T2 post-contrast · coronal · 5.0mm · 0.43mm/px · 1 of 28 slices shown]
[im 1/28]
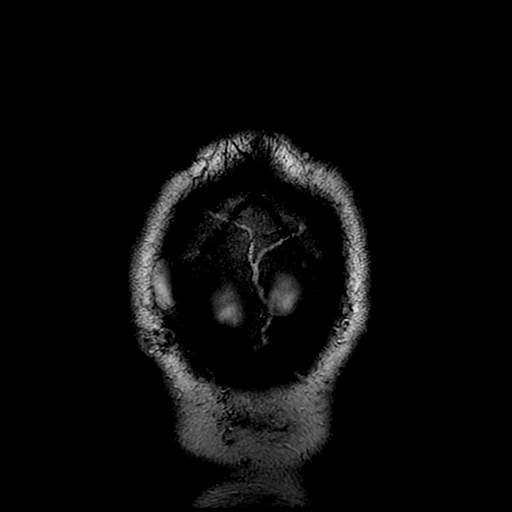

[Series 11: T1 post-contrast · axial · 3.0mm · 0.47mm/px · z∈[-33,+131]mm · 4 of 56 slices shown (1 of 3)]
[im 1/56]
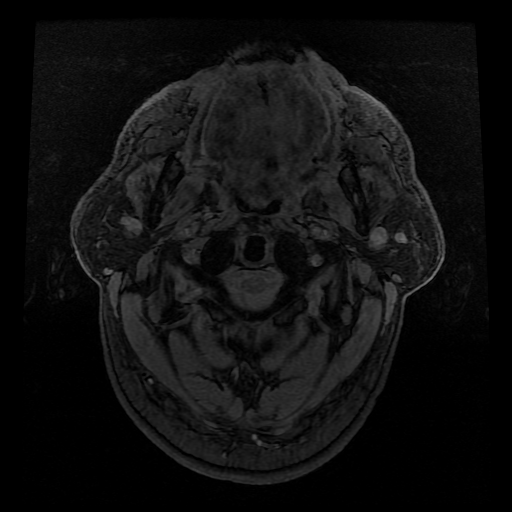
[im 19/56]
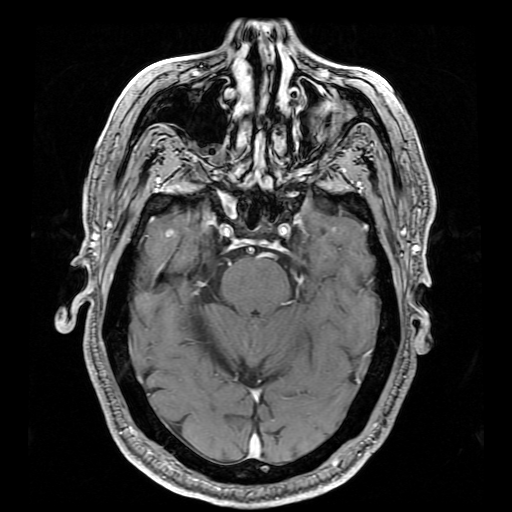
[im 37/56]
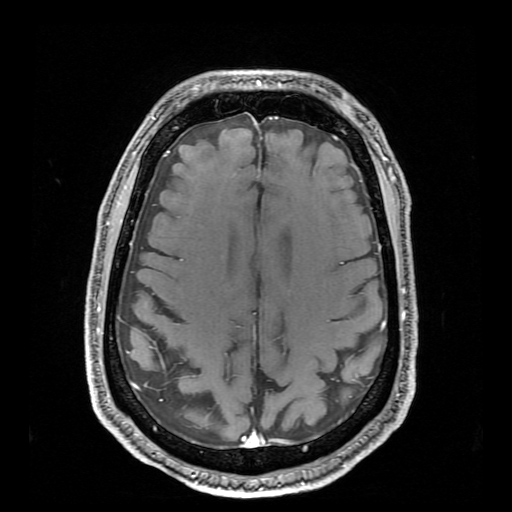
[im 56/56]
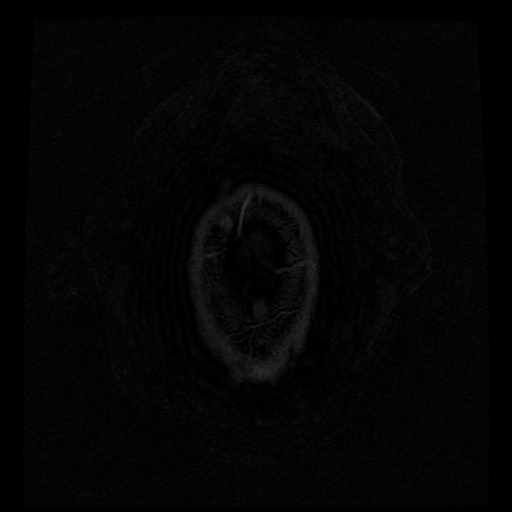

[Series 12: T1 post-contrast · coronal · 5.0mm · 0.43mm/px · 2 of 28 slices shown (2 of 3)]
[im 1/28]
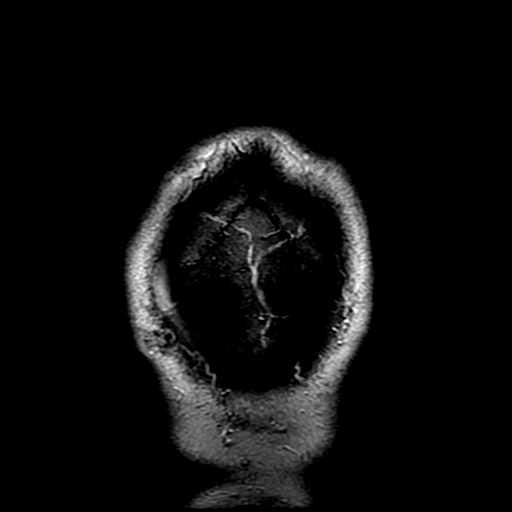
[im 28/28]
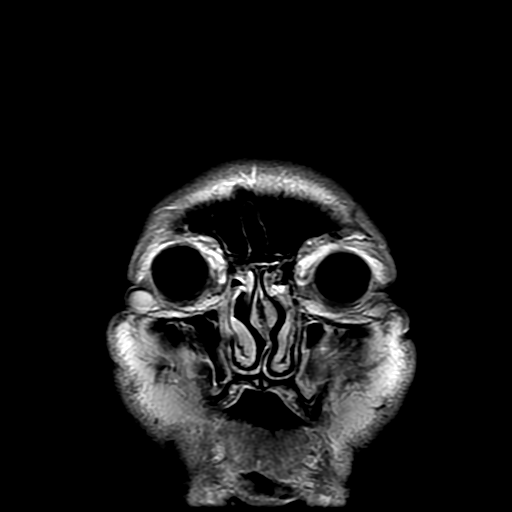

[Series 13: T1 post-contrast · sagittal · 5.0mm · 0.47mm/px · 2 of 26 slices shown (3 of 3)]
[im 1/26]
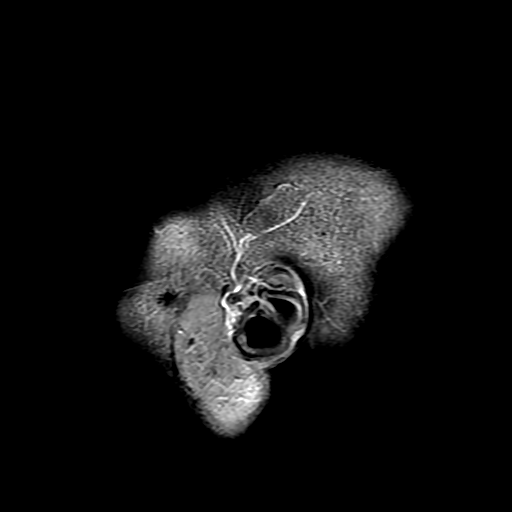
[im 26/26]
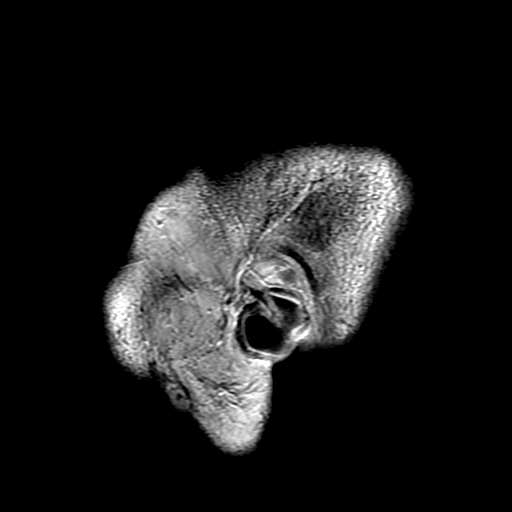

[Series 300: DWI · axial · 3.0mm · 1.09mm/px · z∈[-23,+135]mm · 4 of 54 slices shown (3 of 4)]
[im 1/54]
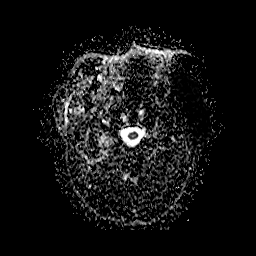
[im 18/54]
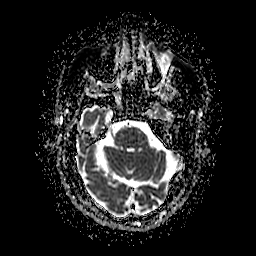
[im 36/54]
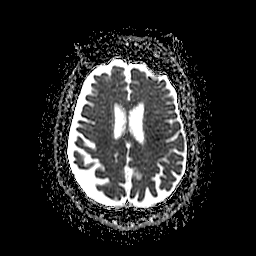
[im 54/54]
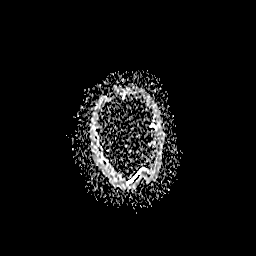

[Series 400: DWI · coronal · 5.0mm · 1.09mm/px · 3 of 37 slices shown (4 of 4)]
[im 1/37]
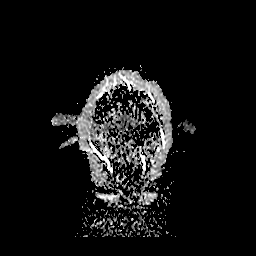
[im 19/37]
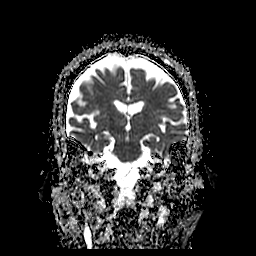
[im 37/37]
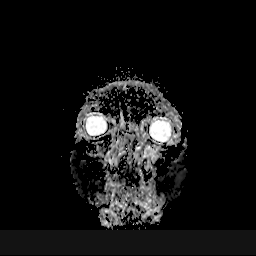

[34 of 48 positions shown; findings below may reference images not displayed]

FINDINGS: Brain: There is no evidence of an acute infarct, intracranial
hemorrhage, mass, or midline shift. There is moderate cerebral
atrophy. Asymmetric extra-axial CSF over the right cerebral
convexity is unchanged and is predominantly due to subarachnoid
space enlargement from cerebral atrophy although there may also be a
coexistent thin subdural hygroma without mass effect. Chronic
lacunar infarcts are again noted in the right caudate nucleus and
cerebellum. No abnormal enhancement is identified.

Vascular: Major intracranial vascular flow voids are preserved.

Skull and upper cervical spine: Unremarkable bone marrow signal.

Sinuses/Orbits: Bilateral cataract extraction. Mild-to-moderate
mucosal thickening in the paranasal sinuses. Clear mastoid air
cells.

Other: None.
IMPRESSION: No evidence of intracranial metastases.

## 2021-07-22 MED ORDER — GADOBUTROL 1 MMOL/ML IV SOLN
10.0000 mL | Freq: Once | INTRAVENOUS | Status: AC | PRN
  Administered 2021-07-22: 10 mL via INTRAVENOUS

## 2021-07-24 ENCOUNTER — Other Ambulatory Visit: Payer: Self-pay | Admitting: Cardiology

## 2021-07-29 ENCOUNTER — Ambulatory Visit (INDEPENDENT_AMBULATORY_CARE_PROVIDER_SITE_OTHER): Payer: Medicare HMO

## 2021-07-29 DIAGNOSIS — I5022 Chronic systolic (congestive) heart failure: Secondary | ICD-10-CM | POA: Diagnosis not present

## 2021-07-29 DIAGNOSIS — Z95810 Presence of automatic (implantable) cardiac defibrillator: Secondary | ICD-10-CM | POA: Diagnosis not present

## 2021-07-31 DIAGNOSIS — J449 Chronic obstructive pulmonary disease, unspecified: Secondary | ICD-10-CM | POA: Diagnosis not present
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
## 2021-07-31 NOTE — Progress Notes (Signed)
EPIC Encounter for ICM Monitoring  Patient Name: Roger Lowe. is a 75 y.o. male Date: 07/31/2021 Primary Care Physican: [name], MD Primary Cardiologist: Harl Bowie Electrophysiologist: Lovena Le BiV Pacing: >99% 07/31/2021 Weight: 232 lbs            Spoke with patient and heart failure questions reviewed.  Pt asymptomatic for fluid accumulation and feeling well.   CorVue thoracic impedance normal.   Prescribed: Furosemide 40 mg take 1 tablet daily. Spironolactone 25 mg take 1 tablet daily   Labs: 05/09/2021 Creatinine 1.07, BUN 13, Potassium 4.0, Sodium 136, GFR >60 05/08/2021 Creatinine 1.06, BUN 13, Potassium 4.0, Sodium 139, GFR >60  05/07/2021 Creatinine 1.17, BUN 15, Potassium 4.6, Sodium 137, GFR >60  05/06/2021 Creatinine 1.33, BUN 15, Potassium 3.8, Sodium 137, GFR 56  A complete set of results can be found in Results Review.   Recommendations:  No changes and encouraged to call if experiencing any fluid symptoms.   Follow-up plan: ICM clinic phone appointment on 09/03/2021.   91 day device clinic remote transmission 08/07/2021.     EP/Cardiology Office Visits:  10/24/2021 with Dr Harl Bowie.   Recall 02/15/2021 with Dr. Lovena Le.     Copy of ICM check sent to Dr. Lovena Le.    3 month ICM trend: 07/29/2021.    12-14 Month ICM trend:       [name], RN 07/31/2021 9:56 AM

## 2021-08-01 ENCOUNTER — Other Ambulatory Visit: Payer: Self-pay

## 2021-08-01 ENCOUNTER — Inpatient Hospital Stay (HOSPITAL_COMMUNITY): Payer: Medicare HMO

## 2021-08-01 ENCOUNTER — Inpatient Hospital Stay (HOSPITAL_COMMUNITY): Payer: Medicare HMO | Attending: Hematology | Admitting: Hematology

## 2021-08-01 ENCOUNTER — Encounter (HOSPITAL_COMMUNITY): Payer: Self-pay

## 2021-08-01 VITALS — BP 145/90 | HR 97 | Temp 97.6°F | Resp 18 | Ht 72.0 in | Wt 231.5 lb

## 2021-08-01 DIAGNOSIS — R911 Solitary pulmonary nodule: Secondary | ICD-10-CM | POA: Diagnosis not present

## 2021-08-01 DIAGNOSIS — Z85118 Personal history of other malignant neoplasm of bronchus and lung: Secondary | ICD-10-CM | POA: Insufficient documentation

## 2021-08-01 DIAGNOSIS — C3490 Malignant neoplasm of unspecified part of unspecified bronchus or lung: Secondary | ICD-10-CM | POA: Diagnosis not present

## 2021-08-01 NOTE — Patient Instructions (Signed)
[place] at [facility] Discharge Instructions   You were seen and examined today by Dr. Delton Coombes. Follow up with Dr. Valeta Harms as scheduled. Return in 14 weeks as scheduled. Please call the clinic if you have not had CT scan done prior to your next visit at [facility].    Thank you for choosing [place] at [facility] to provide your oncology and hematology care.  To afford each patient quality time with our provider, please arrive at least 15 minutes before your scheduled appointment time.   If you have a lab appointment with the [facility] please come in thru the Main Entrance and check in at the main information desk.  You need to re-schedule your appointment should you arrive 10 or more minutes late.  We strive to give you quality time with our providers, and arriving late affects you and other patients whose appointments are after yours.  Also, if you no show three or more times for appointments you may be dismissed from the clinic at the providers discretion.     Again, thank you for choosing [facility].  Our hope is that these requests will decrease the amount of time that you wait before being seen by our physicians.       _____________________________________________________________  Should you have questions after your visit to [facility], please contact our office at [phone] and follow the prompts.  Our office hours are 8:00 a.m. and 4:30 p.m. Monday - Friday.  Please note that voicemails left after 4:00 p.m. may not be returned until the following business day.  We are closed weekends and major holidays.  You do have access to a nurse 24-7, just call the main number to the clinic [phone] and do not press any options, hold on the line and a nurse will answer the phone.    For prescription refill requests, have your pharmacy contact our office and allow 72 hours.    Due to Covid,  you will need to wear a mask upon entering the hospital. If you do not have a mask, a mask will be given to you at the Main Entrance upon arrival. For doctor visits, patients may have 1 support person age 39 or older with them. For treatment visits, patients can not have anyone with them due to social distancing guidelines and our immunocompromised population.

## 2021-08-07 ENCOUNTER — Ambulatory Visit (INDEPENDENT_AMBULATORY_CARE_PROVIDER_SITE_OTHER): Payer: Medicare HMO

## 2021-08-07 DIAGNOSIS — I428 Other cardiomyopathies: Secondary | ICD-10-CM

## 2021-08-07 LAB — CUP PACEART REMOTE DEVICE CHECK
Battery Remaining Longevity: 74 mo
Battery Remaining Percentage: 80 %
Battery Voltage: 2.96 V
Brady Statistic AP VP Percent: 1 %
Brady Statistic AP VS Percent: 1 %
Brady Statistic AS VP Percent: 99 %
Brady Statistic AS VS Percent: 1 %
Brady Statistic RA Percent Paced: 1 %
Date Time Interrogation Session: 20221207020508
HighPow Impedance: 70 Ohm
Implantable Lead Implant Date: 20210308
Implantable Lead Implant Date: 20210308
Implantable Lead Implant Date: 20210308
Implantable Lead Location: 753858
Implantable Lead Location: 753859
Implantable Lead Location: 753860
Implantable Lead Model: 7122
Implantable Pulse Generator Implant Date: 20210308
Lead Channel Impedance Value: 390 Ohm
Lead Channel Impedance Value: 510 Ohm
Lead Channel Impedance Value: 880 Ohm
Lead Channel Pacing Threshold Amplitude: 0.625 V
Lead Channel Pacing Threshold Amplitude: 0.75 V
Lead Channel Pacing Threshold Amplitude: 1.25 V
Lead Channel Pacing Threshold Pulse Width: 0.5 ms
Lead Channel Pacing Threshold Pulse Width: 0.5 ms
Lead Channel Pacing Threshold Pulse Width: 0.5 ms
Lead Channel Sensing Intrinsic Amplitude: 11.8 mV
Lead Channel Sensing Intrinsic Amplitude: 4.1 mV
Lead Channel Setting Pacing Amplitude: 1.625
Lead Channel Setting Pacing Amplitude: 2 V
Lead Channel Setting Pacing Amplitude: 2.5 V
Lead Channel Setting Pacing Pulse Width: 0.5 ms
Lead Channel Setting Pacing Pulse Width: 0.5 ms
Lead Channel Setting Sensing Sensitivity: 0.5 mV
Pulse Gen Serial Number: 111019088

## 2021-08-09 LAB — FUNGUS CULTURE WITH STAIN

## 2021-08-09 LAB — FUNGUS CULTURE RESULT

## 2021-08-09 LAB — FUNGAL ORGANISM REFLEX

## 2021-08-13 ENCOUNTER — Encounter: Payer: Self-pay | Admitting: Urology

## 2021-08-13 ENCOUNTER — Ambulatory Visit (INDEPENDENT_AMBULATORY_CARE_PROVIDER_SITE_OTHER): Payer: Medicare HMO | Admitting: Urology

## 2021-08-13 ENCOUNTER — Other Ambulatory Visit: Payer: Self-pay

## 2021-08-13 VITALS — BP 167/88 | HR 97

## 2021-08-13 DIAGNOSIS — I5032 Chronic diastolic (congestive) heart failure: Secondary | ICD-10-CM | POA: Diagnosis not present

## 2021-08-13 DIAGNOSIS — N138 Other obstructive and reflux uropathy: Secondary | ICD-10-CM | POA: Diagnosis not present

## 2021-08-13 DIAGNOSIS — R972 Elevated prostate specific antigen [PSA]: Secondary | ICD-10-CM | POA: Diagnosis not present

## 2021-08-13 DIAGNOSIS — I5022 Chronic systolic (congestive) heart failure: Secondary | ICD-10-CM | POA: Diagnosis not present

## 2021-08-13 DIAGNOSIS — E1065 Type 1 diabetes mellitus with hyperglycemia: Secondary | ICD-10-CM | POA: Diagnosis not present

## 2021-08-13 DIAGNOSIS — N401 Enlarged prostate with lower urinary tract symptoms: Secondary | ICD-10-CM

## 2021-08-13 DIAGNOSIS — J9601 Acute respiratory failure with hypoxia: Secondary | ICD-10-CM | POA: Diagnosis not present

## 2021-08-13 LAB — URINALYSIS, ROUTINE W REFLEX MICROSCOPIC
Bilirubin, UA: NEGATIVE
Ketones, UA: NEGATIVE
Leukocytes,UA: NEGATIVE
Nitrite, UA: NEGATIVE
RBC, UA: NEGATIVE
Specific Gravity, UA: 1.015 (ref 1.005–1.030)
Urobilinogen, Ur: 0.2 mg/dL (ref 0.2–1.0)
pH, UA: 5.5 (ref 5.0–7.5)

## 2021-08-13 LAB — MICROSCOPIC EXAMINATION
Bacteria, UA: NONE SEEN
Epithelial Cells (non renal): NONE SEEN /hpf (ref 0–10)
RBC, Urine: NONE SEEN /hpf (ref 0–2)
Renal Epithel, UA: NONE SEEN /hpf
WBC, UA: NONE SEEN /hpf (ref 0–5)
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
## 2021-08-13 NOTE — Progress Notes (Signed)
Urological Symptom Review  Patient is experiencing the following symptoms: Frequent urination   Review of Systems  Gastrointestinal (upper)  : Nausea  Gastrointestinal (lower) : Constipation  Constitutional : Fatigue  Skin: Negative for skin symptoms  Eyes: Blurred vision  Ear/Nose/Throat : Sore throat Sinus problems  Hematologic/Lymphatic: Negative for Hematologic/Lymphatic symptoms  Cardiovascular : Negative for cardiovascular symptoms  Respiratory : Cough Shortness of breath  Endocrine: Negative for endocrine symptoms  Musculoskeletal: Back pain Joint pain  Neurological: Headaches  Psychologic: Negative for psychiatric symptoms

## 2021-08-14 LAB — PSA: Prostate Specific Ag, Serum: 5.6 ng/mL — ABNORMAL HIGH (ref 0.0–4.0)

## 2021-08-15 NOTE — Progress Notes (Signed)
Remote ICD transmission.   

## 2021-08-19 ENCOUNTER — Other Ambulatory Visit (HOSPITAL_COMMUNITY): Payer: Self-pay | Admitting: Family Medicine

## 2021-08-19 ENCOUNTER — Ambulatory Visit (HOSPITAL_COMMUNITY)
Admission: RE | Admit: 2021-08-19 | Discharge: 2021-08-19 | Disposition: A | Discharge status: Home / Self Care | Payer: Medicare HMO | Source: Ambulatory Visit | Attending: Family Medicine | Admitting: Family Medicine

## 2021-08-19 ENCOUNTER — Other Ambulatory Visit: Payer: Self-pay

## 2021-08-19 DIAGNOSIS — K5900 Constipation, unspecified: Secondary | ICD-10-CM

## 2021-08-19 DIAGNOSIS — R1084 Generalized abdominal pain: Secondary | ICD-10-CM | POA: Diagnosis not present

## 2021-08-19 DIAGNOSIS — I2510 Atherosclerotic heart disease of native coronary artery without angina pectoris: Secondary | ICD-10-CM | POA: Diagnosis not present

## 2021-08-19 DIAGNOSIS — I10 Essential (primary) hypertension: Secondary | ICD-10-CM | POA: Diagnosis not present

## 2021-08-19 IMAGING — DX DG ABDOMEN 1V
2 series · 2 of 2 positions shown · non-contrast
Comparison: [DATE]

CLINICAL DATA: Generalized abdominal pain, constipation for 7
months, history of lung cancer

EXAM:
ABDOMEN - 1 VIEW

[abdomen kub (1 of 2)]
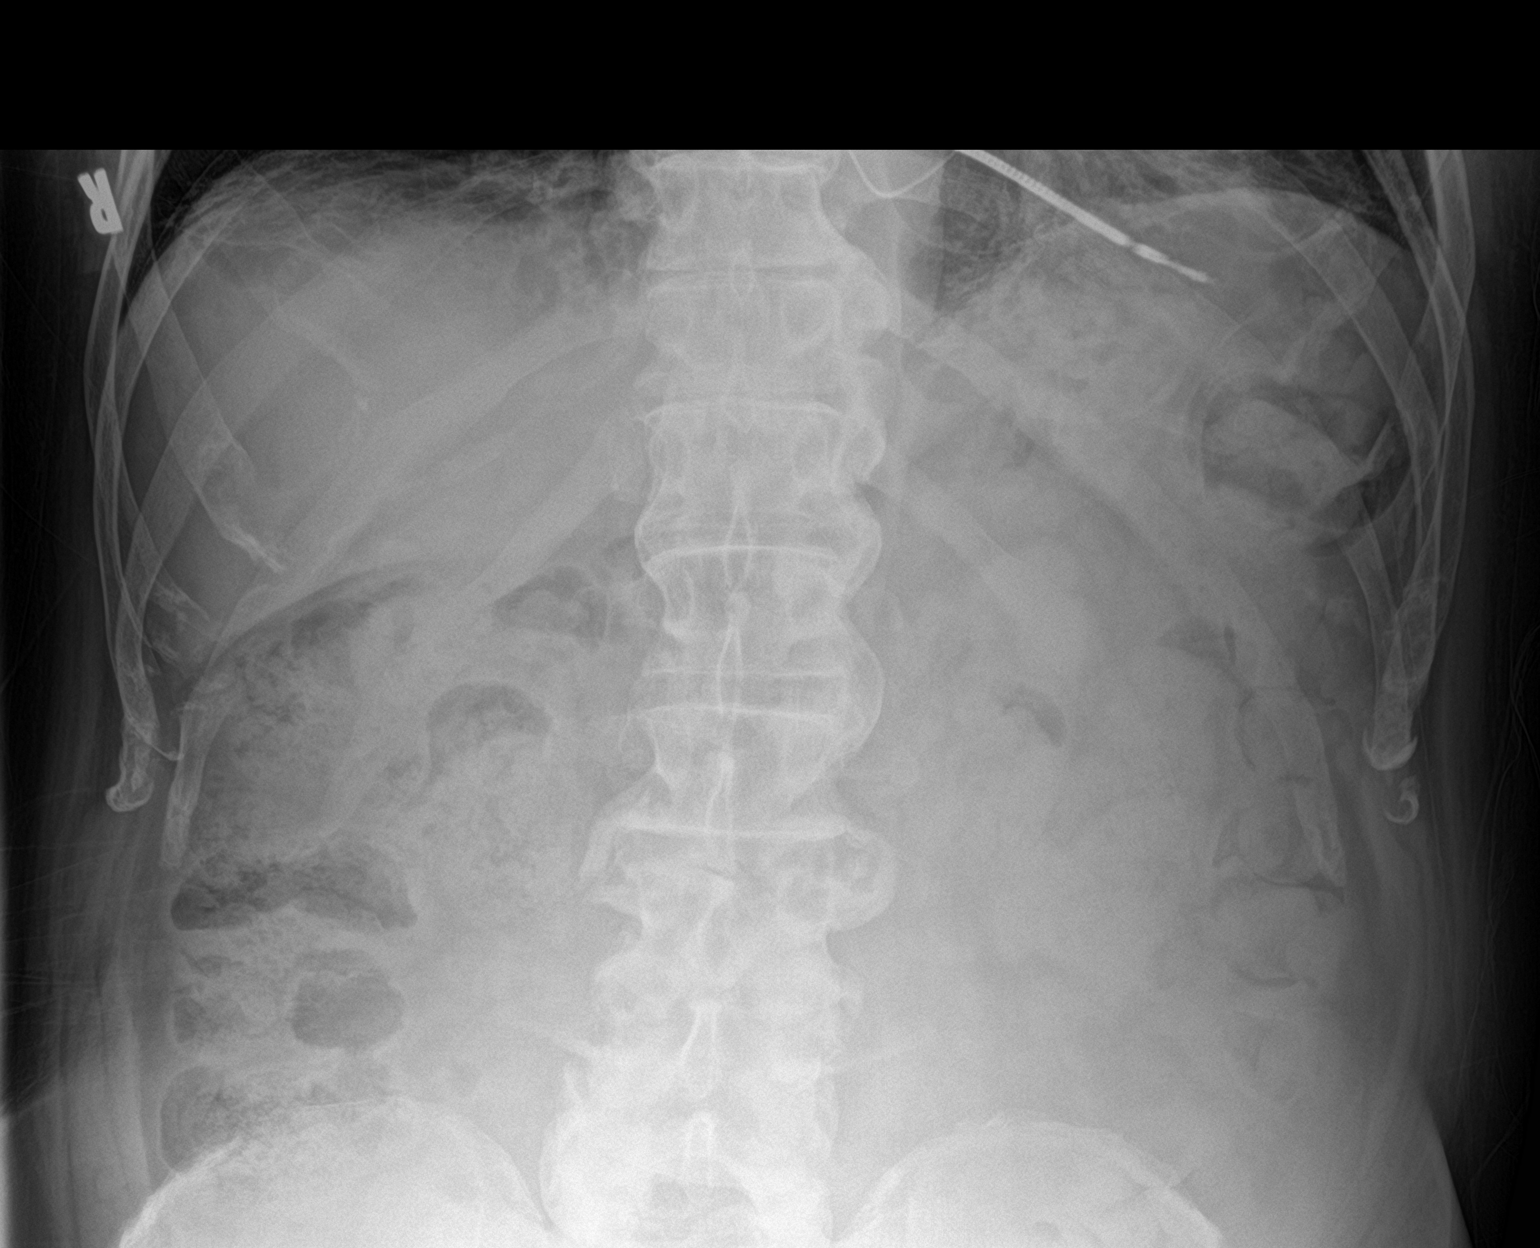

[abdomen kub (2 of 2)]
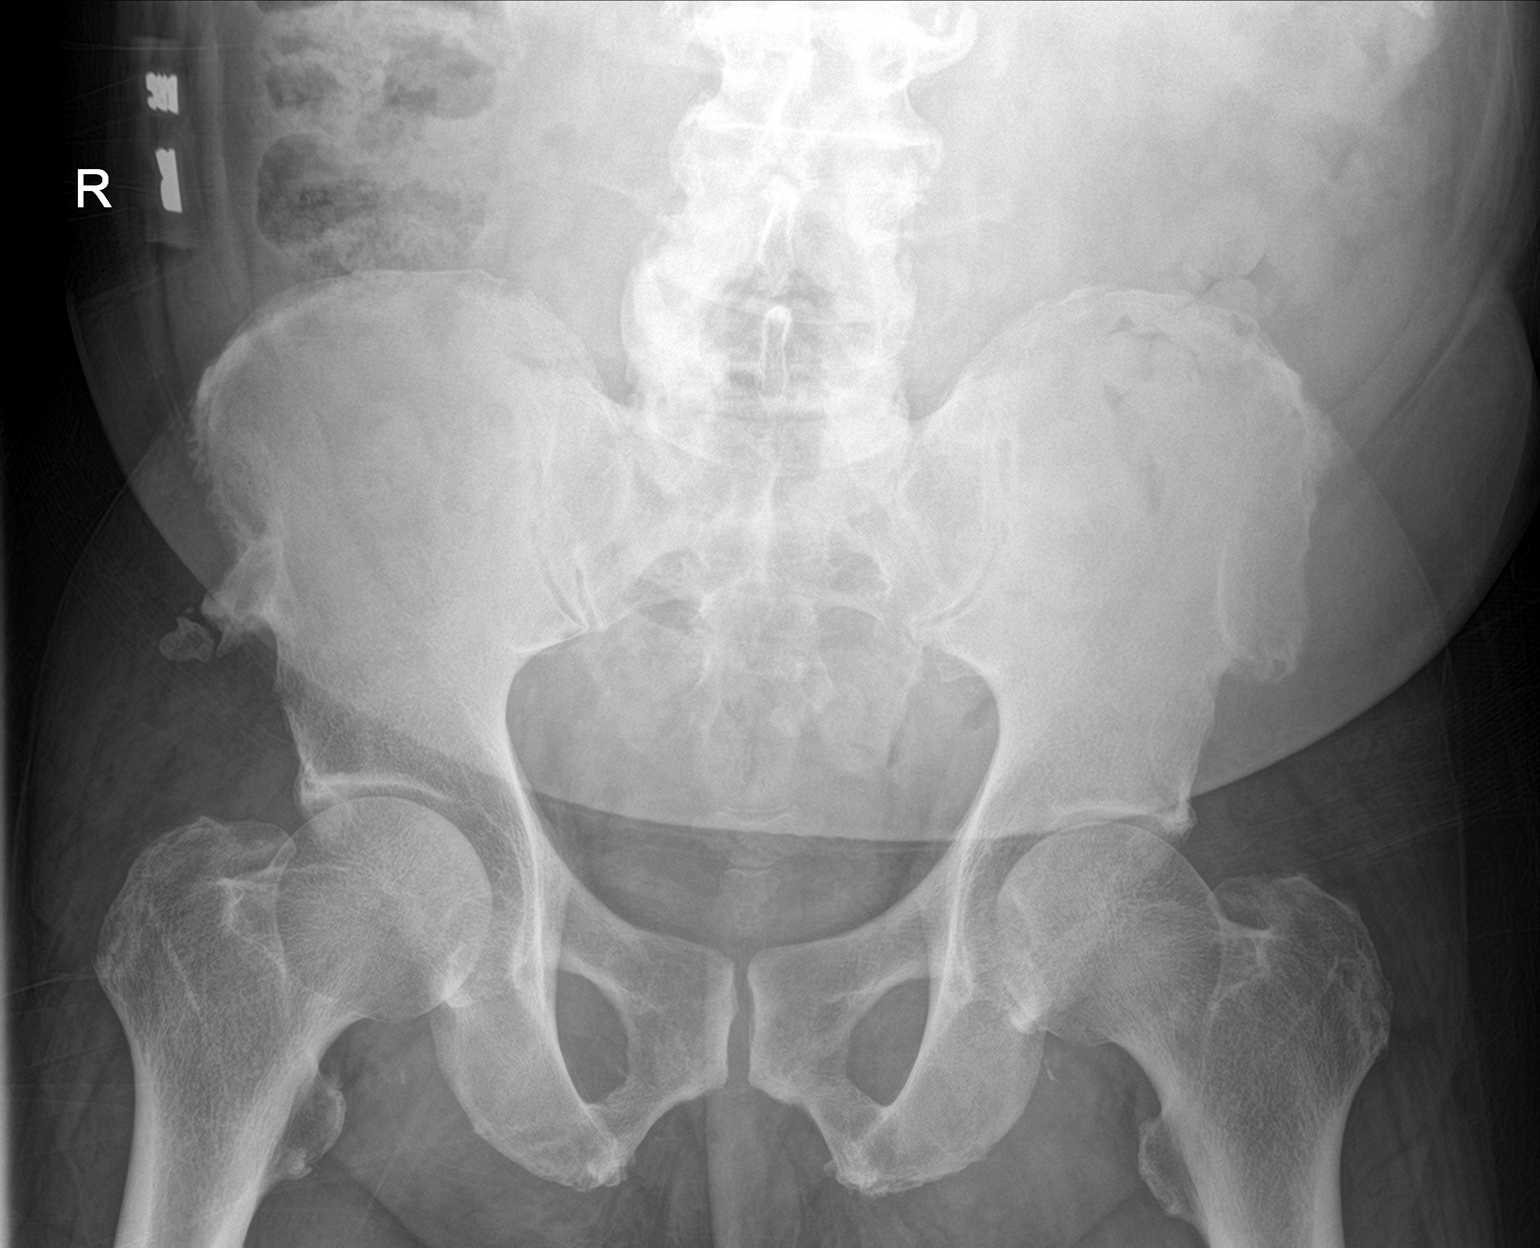

[2 of 2 positions shown; findings below may reference images not displayed]

FINDINGS: 2 supine frontal views of the abdomen and pelvis are obtained. No
bowel obstruction or ileus. Moderate retained stool throughout the
colon consistent with given history of constipation. No masses or
abnormal calcifications. Multi lead AICD overlies the cardiac
silhouette. Chronic scarring at the lung bases. Stable spondylosis.
IMPRESSION: 1. Moderate fecal retention consistent with constipation. No
obstruction or ileus.

## 2021-08-22 LAB — ACID FAST CULTURE WITH REFLEXED SENSITIVITIES (MYCOBACTERIA): Acid Fast Culture: NEGATIVE

## 2021-08-23 NOTE — Telephone Encounter (Signed)
Error - no note needed.  Roger Lowe, PT

## 2021-08-29 DIAGNOSIS — J449 Chronic obstructive pulmonary disease, unspecified: Secondary | ICD-10-CM | POA: Diagnosis not present

## 2021-08-30 DIAGNOSIS — E782 Mixed hyperlipidemia: Secondary | ICD-10-CM | POA: Diagnosis not present

## 2021-08-30 DIAGNOSIS — I10 Essential (primary) hypertension: Secondary | ICD-10-CM | POA: Diagnosis not present

## 2021-09-03 ENCOUNTER — Ambulatory Visit (INDEPENDENT_AMBULATORY_CARE_PROVIDER_SITE_OTHER): Payer: Medicare HMO

## 2021-09-03 DIAGNOSIS — Z95810 Presence of automatic (implantable) cardiac defibrillator: Secondary | ICD-10-CM

## 2021-09-03 DIAGNOSIS — I10 Essential (primary) hypertension: Secondary | ICD-10-CM | POA: Diagnosis not present

## 2021-09-03 DIAGNOSIS — E1165 Type 2 diabetes mellitus with hyperglycemia: Secondary | ICD-10-CM | POA: Diagnosis not present

## 2021-09-03 DIAGNOSIS — I5022 Chronic systolic (congestive) heart failure: Secondary | ICD-10-CM

## 2021-09-05 DIAGNOSIS — I2510 Atherosclerotic heart disease of native coronary artery without angina pectoris: Secondary | ICD-10-CM | POA: Diagnosis not present

## 2021-09-05 DIAGNOSIS — M109 Gout, unspecified: Secondary | ICD-10-CM | POA: Diagnosis not present

## 2021-09-05 DIAGNOSIS — J189 Pneumonia, unspecified organism: Secondary | ICD-10-CM | POA: Diagnosis not present

## 2021-09-05 DIAGNOSIS — E1165 Type 2 diabetes mellitus with hyperglycemia: Secondary | ICD-10-CM | POA: Diagnosis not present

## 2021-09-05 DIAGNOSIS — Z950 Presence of cardiac pacemaker: Secondary | ICD-10-CM | POA: Diagnosis not present

## 2021-09-05 DIAGNOSIS — N1831 Chronic kidney disease, stage 3a: Secondary | ICD-10-CM | POA: Diagnosis not present

## 2021-09-05 DIAGNOSIS — Z9981 Dependence on supplemental oxygen: Secondary | ICD-10-CM | POA: Diagnosis not present

## 2021-09-05 DIAGNOSIS — D696 Thrombocytopenia, unspecified: Secondary | ICD-10-CM | POA: Diagnosis not present

## 2021-09-05 DIAGNOSIS — K219 Gastro-esophageal reflux disease without esophagitis: Secondary | ICD-10-CM | POA: Diagnosis not present

## 2021-09-05 DIAGNOSIS — I10 Essential (primary) hypertension: Secondary | ICD-10-CM | POA: Diagnosis not present

## 2021-09-06 ENCOUNTER — Telehealth: Payer: Self-pay

## 2021-09-06 NOTE — Progress Notes (Signed)
EPIC Encounter for ICM Monitoring  Patient Name: Roger Lowe. is a 77 y.o. male Date: 09/06/2021 Primary Care Physican: [name], MD Primary Cardiologist: Harl Bowie Electrophysiologist: Lovena Le BiV Pacing: >99% 07/31/2021 Weight: 232 lbs            Attempted call to patient and unable to reach.  Left message to return call. Transmission reviewed.    CorVue thoracic impedance normal but was suggesting possible dryness from 08/20/2021-09/01/2021.   Prescribed: Furosemide 40 mg take 1 tablet daily. Spironolactone 25 mg take 1 tablet daily Farxiga 5 mg take 1 tablet daily   Labs: 07/09/2021 Creatinine 1.22, BUN 7,   Potassium 4.5, Sodium 135, GFR >60 05/09/2021 Creatinine 1.07, BUN 13, Potassium 4.0, Sodium 136, GFR >60 05/08/2021 Creatinine 1.06, BUN 13, Potassium 4.0, Sodium 139, GFR >60  05/07/2021 Creatinine 1.17, BUN 15, Potassium 4.6, Sodium 137, GFR >60  05/06/2021 Creatinine 1.33, BUN 15, Potassium 3.8, Sodium 137, GFR 56  A complete set of results can be found in Results Review.   Recommendations:  Unable to reach.     Follow-up plan: ICM clinic phone appointment on 10/07/2021.   91 day device clinic remote transmission 11/06/2021.     EP/Cardiology Office Visits:  10/24/2021 with Dr Harl Bowie.   Recall 02/15/2021 with Dr. Lovena Le.     Copy of ICM check sent to Dr. Lovena Le.    3 month ICM trend: 09/03/2021.    12-14 Month ICM trend:     [name], RN 09/06/2021 9:40 AM

## 2021-09-06 NOTE — Telephone Encounter (Signed)
Remote ICM transmission received.  Attempted call to patient regarding ICM remote transmission and left message to return call   

## 2021-09-09 ENCOUNTER — Other Ambulatory Visit: Payer: Self-pay | Admitting: Family Medicine

## 2021-09-09 ENCOUNTER — Other Ambulatory Visit (HOSPITAL_COMMUNITY): Payer: Self-pay | Admitting: Family Medicine

## 2021-09-09 ENCOUNTER — Telehealth: Payer: Self-pay | Admitting: Pulmonary Disease

## 2021-09-09 DIAGNOSIS — R55 Syncope and collapse: Secondary | ICD-10-CM

## 2021-09-09 MED ORDER — AZITHROMYCIN 250 MG PO TABS: ORAL_TABLET | ORAL | 5 refills | Status: DC

## 2021-09-09 NOTE — Telephone Encounter (Signed)
Called and spoke with patient. He stated that he has been without his azithromycin for the past 10 days and needs a refill. Confirmed his pharmacy. RX has been sent to the pharmacy and patient is aware.   Nothing further needed at time of call.

## 2021-09-09 NOTE — Telephone Encounter (Signed)
Duplicate encounter. Please see other encounter from earlier today.

## 2021-09-12 ENCOUNTER — Other Ambulatory Visit (HOSPITAL_COMMUNITY): Payer: Self-pay | Admitting: Family Medicine

## 2021-09-12 DIAGNOSIS — E1065 Type 1 diabetes mellitus with hyperglycemia: Secondary | ICD-10-CM | POA: Diagnosis not present

## 2021-09-12 DIAGNOSIS — R55 Syncope and collapse: Secondary | ICD-10-CM

## 2021-09-13 DIAGNOSIS — I5032 Chronic diastolic (congestive) heart failure: Secondary | ICD-10-CM | POA: Diagnosis not present

## 2021-09-13 DIAGNOSIS — I5022 Chronic systolic (congestive) heart failure: Secondary | ICD-10-CM | POA: Diagnosis not present

## 2021-09-13 DIAGNOSIS — J9601 Acute respiratory failure with hypoxia: Secondary | ICD-10-CM | POA: Diagnosis not present

## 2021-09-19 ENCOUNTER — Emergency Department (HOSPITAL_COMMUNITY)
Admission: EM | Admit: 2021-09-19 | Discharge: 2021-09-19 | Disposition: A | Discharge status: Home / Self Care | Payer: Medicare HMO | Attending: Emergency Medicine | Admitting: Emergency Medicine

## 2021-09-19 ENCOUNTER — Other Ambulatory Visit: Payer: Self-pay

## 2021-09-19 ENCOUNTER — Emergency Department (HOSPITAL_COMMUNITY): Payer: Medicare HMO

## 2021-09-19 ENCOUNTER — Encounter (HOSPITAL_COMMUNITY): Payer: Self-pay | Admitting: Emergency Medicine

## 2021-09-19 DIAGNOSIS — Z79899 Other long term (current) drug therapy: Secondary | ICD-10-CM | POA: Diagnosis not present

## 2021-09-19 DIAGNOSIS — I130 Hypertensive heart and chronic kidney disease with heart failure and stage 1 through stage 4 chronic kidney disease, or unspecified chronic kidney disease: Secondary | ICD-10-CM | POA: Diagnosis not present

## 2021-09-19 DIAGNOSIS — Z794 Long term (current) use of insulin: Secondary | ICD-10-CM | POA: Insufficient documentation

## 2021-09-19 DIAGNOSIS — M25522 Pain in left elbow: Secondary | ICD-10-CM | POA: Diagnosis not present

## 2021-09-19 DIAGNOSIS — W01198A Fall on same level from slipping, tripping and stumbling with subsequent striking against other object, initial encounter: Secondary | ICD-10-CM | POA: Insufficient documentation

## 2021-09-19 DIAGNOSIS — S79912A Unspecified injury of left hip, initial encounter: Secondary | ICD-10-CM | POA: Insufficient documentation

## 2021-09-19 DIAGNOSIS — R059 Cough, unspecified: Secondary | ICD-10-CM | POA: Insufficient documentation

## 2021-09-19 DIAGNOSIS — R079 Chest pain, unspecified: Secondary | ICD-10-CM | POA: Diagnosis not present

## 2021-09-19 DIAGNOSIS — M778 Other enthesopathies, not elsewhere classified: Secondary | ICD-10-CM | POA: Diagnosis not present

## 2021-09-19 DIAGNOSIS — N189 Chronic kidney disease, unspecified: Secondary | ICD-10-CM | POA: Insufficient documentation

## 2021-09-19 DIAGNOSIS — S0990XA Unspecified injury of head, initial encounter: Secondary | ICD-10-CM | POA: Diagnosis not present

## 2021-09-19 DIAGNOSIS — Y92009 Unspecified place in unspecified non-institutional (private) residence as the place of occurrence of the external cause: Secondary | ICD-10-CM | POA: Diagnosis not present

## 2021-09-19 DIAGNOSIS — E1122 Type 2 diabetes mellitus with diabetic chronic kidney disease: Secondary | ICD-10-CM | POA: Insufficient documentation

## 2021-09-19 DIAGNOSIS — R000 Tachycardia, unspecified: Secondary | ICD-10-CM | POA: Diagnosis not present

## 2021-09-19 DIAGNOSIS — Z7901 Long term (current) use of anticoagulants: Secondary | ICD-10-CM | POA: Insufficient documentation

## 2021-09-19 DIAGNOSIS — Z20822 Contact with and (suspected) exposure to covid-19: Secondary | ICD-10-CM | POA: Diagnosis not present

## 2021-09-19 DIAGNOSIS — J449 Chronic obstructive pulmonary disease, unspecified: Secondary | ICD-10-CM | POA: Diagnosis not present

## 2021-09-19 DIAGNOSIS — I509 Heart failure, unspecified: Secondary | ICD-10-CM | POA: Diagnosis not present

## 2021-09-19 DIAGNOSIS — M2578 Osteophyte, vertebrae: Secondary | ICD-10-CM | POA: Diagnosis not present

## 2021-09-19 DIAGNOSIS — M25559 Pain in unspecified hip: Secondary | ICD-10-CM

## 2021-09-19 DIAGNOSIS — S59902A Unspecified injury of left elbow, initial encounter: Secondary | ICD-10-CM | POA: Diagnosis not present

## 2021-09-19 DIAGNOSIS — R0789 Other chest pain: Secondary | ICD-10-CM | POA: Diagnosis not present

## 2021-09-19 DIAGNOSIS — I672 Cerebral atherosclerosis: Secondary | ICD-10-CM | POA: Diagnosis not present

## 2021-09-19 DIAGNOSIS — W19XXXA Unspecified fall, initial encounter: Secondary | ICD-10-CM

## 2021-09-19 DIAGNOSIS — R509 Fever, unspecified: Secondary | ICD-10-CM | POA: Diagnosis not present

## 2021-09-19 DIAGNOSIS — M25552 Pain in left hip: Secondary | ICD-10-CM | POA: Diagnosis not present

## 2021-09-19 DIAGNOSIS — R5383 Other fatigue: Secondary | ICD-10-CM | POA: Insufficient documentation

## 2021-09-19 DIAGNOSIS — J320 Chronic maxillary sinusitis: Secondary | ICD-10-CM | POA: Diagnosis not present

## 2021-09-19 DIAGNOSIS — J9811 Atelectasis: Secondary | ICD-10-CM | POA: Diagnosis not present

## 2021-09-19 LAB — CBC
HCT: 43.8 % (ref 39.0–52.0)
Hemoglobin: 13.9 g/dL (ref 13.0–17.0)
MCH: 29.5 pg (ref 26.0–34.0)
MCHC: 31.7 g/dL (ref 30.0–36.0)
MCV: 93 fL (ref 80.0–100.0)
Platelets: 208 10*3/uL (ref 150–400)
RBC: 4.71 MIL/uL (ref 4.22–5.81)
RDW: 13.6 % (ref 11.5–15.5)
WBC: 9.2 10*3/uL (ref 4.0–10.5)
nRBC: 0 % (ref 0.0–0.2)

## 2021-09-19 LAB — TROPONIN I (HIGH SENSITIVITY)
Troponin I (High Sensitivity): 10 ng/L (ref <18)
Troponin I (High Sensitivity): 9 ng/L (ref <18)

## 2021-09-19 LAB — RESP PANEL BY RT-PCR (FLU A&B, COVID) ARPGX2
Influenza A by PCR: NEGATIVE
Influenza B by PCR: NEGATIVE
SARS Coronavirus 2 by RT PCR: NEGATIVE

## 2021-09-19 LAB — BASIC METABOLIC PANEL
Anion gap: 9 (ref 5–15)
BUN: 14 mg/dL (ref 8–23)
CO2: 23 mmol/L (ref 22–32)
Calcium: 9.4 mg/dL (ref 8.9–10.3)
Chloride: 105 mmol/L (ref 98–111)
Creatinine, Ser: 1.27 mg/dL — ABNORMAL HIGH (ref 0.61–1.24)
GFR, Estimated: 60 mL/min — ABNORMAL LOW (ref >60)
Glucose, Bld: 154 mg/dL — ABNORMAL HIGH (ref 70–99)
Potassium: 4.2 mmol/L (ref 3.5–5.1)
Sodium: 137 mmol/L (ref 135–145)

## 2021-09-19 IMAGING — CT CT CERVICAL SPINE W/O CM
3 of 4 series · 13 of 33 positions shown, 16 images · non-contrast
Comparison: [DATE]

CLINICAL DATA: Fell last night with trauma to the head and neck.
Anticoagulated.



[Series 9: sagittal bone · sagittal · 0.37mm/px · 5 of 61 slices shown, 6 images]
[im 21/61  bone]
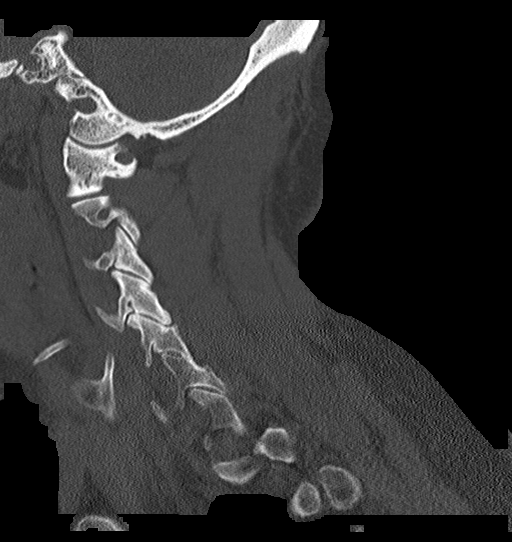
[im 26/61  bone]
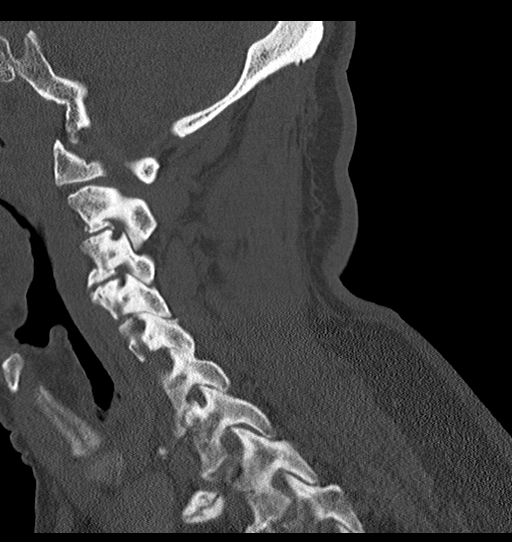
[im 31/61  soft-tissue]
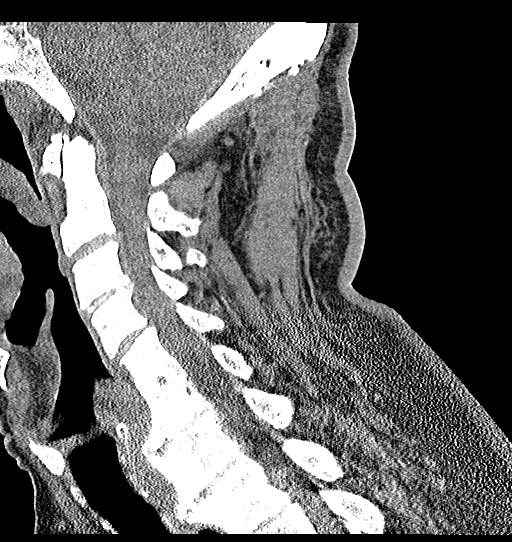
[im 31/61  bone]
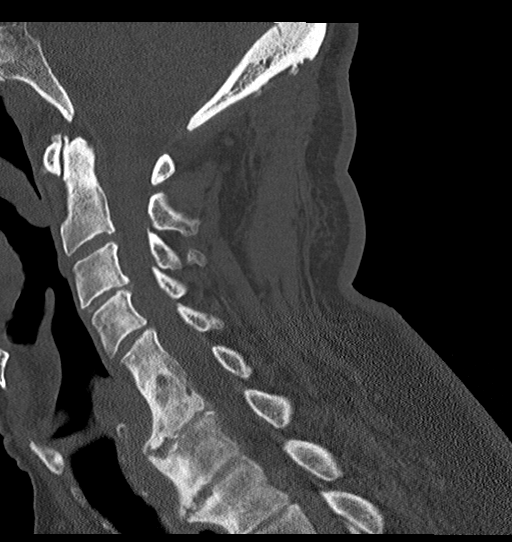
[im 36/61  bone]
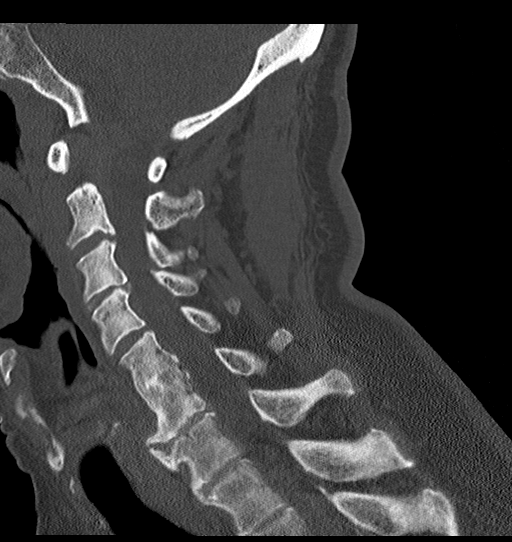
[im 41/61  bone]
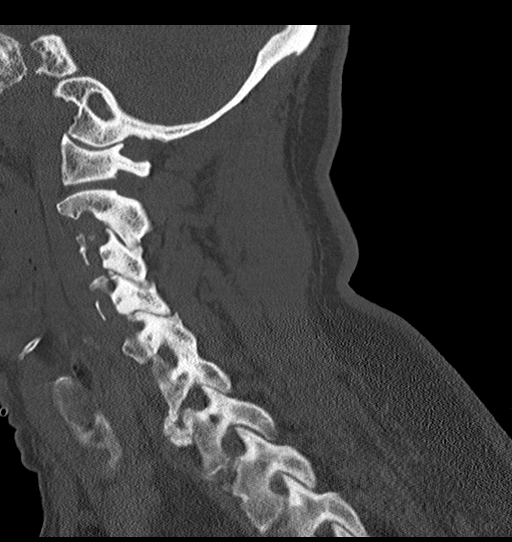

[Series 10: coronal bone · coronal · 0.28mm/px · 3 of 72 slices shown]
[im 18/72  bone]
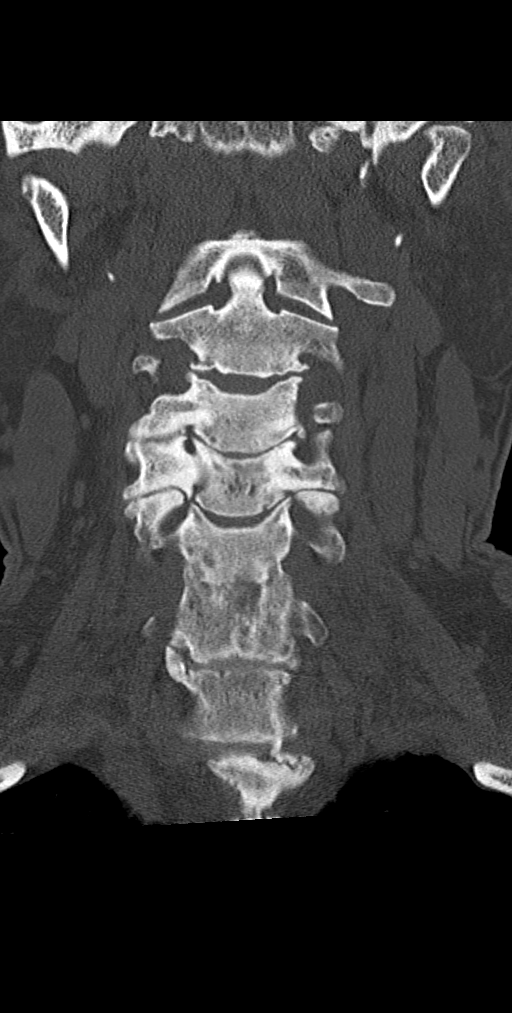
[im 30/72  bone]
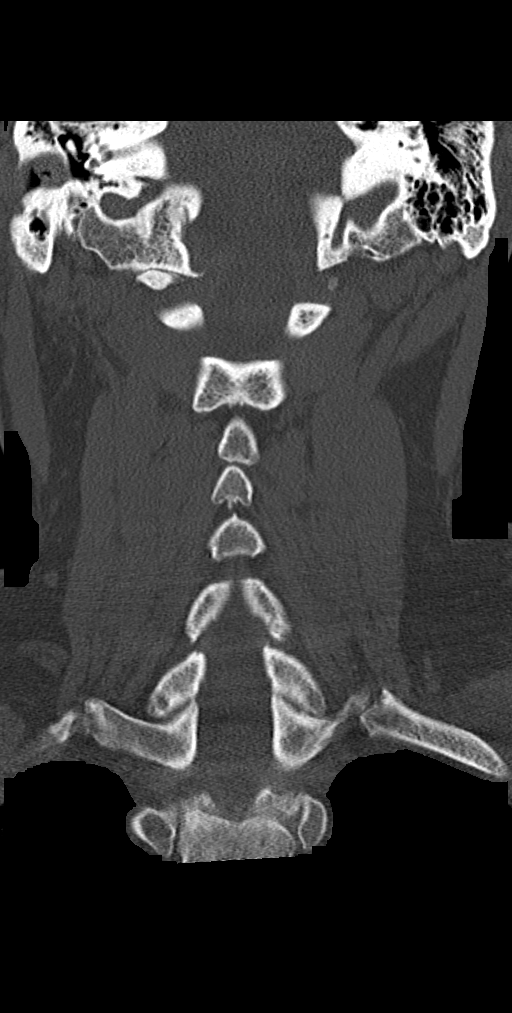
[im 42/72  bone]
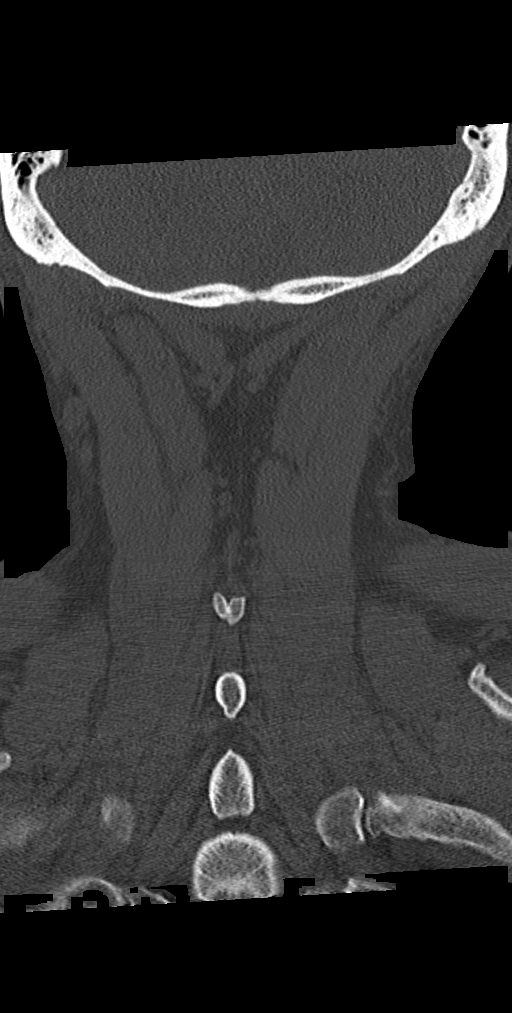

[Series 11: orthogonal axials · axial · 0.21mm/px · z∈[+1502,+1610]mm · 5 of 88 slices shown, 7 images]
[im 15/88  soft-tissue]
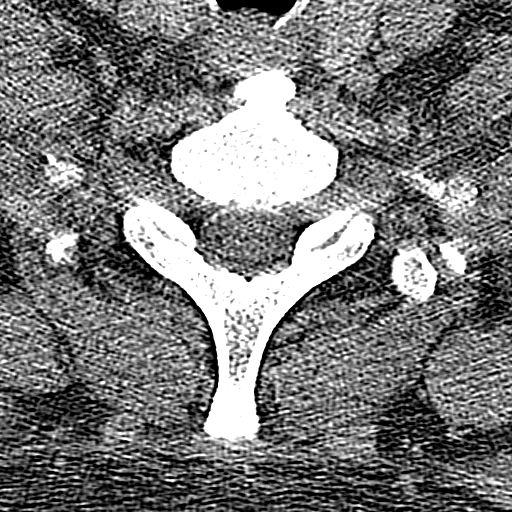
[im 15/88  bone]
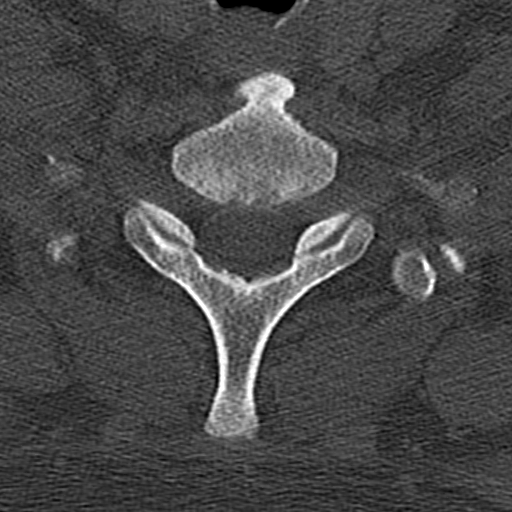
[im 30/88  bone]
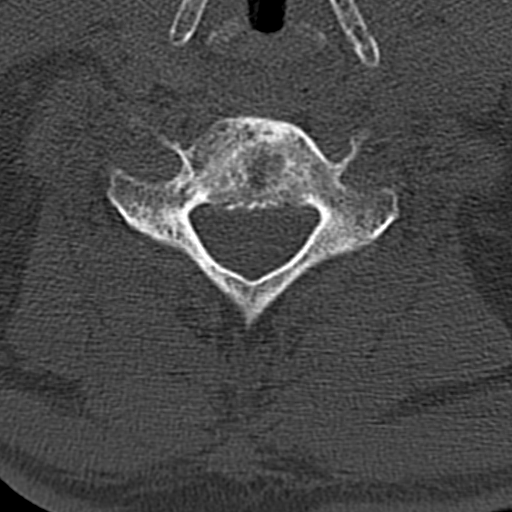
[im 44/88  bone]
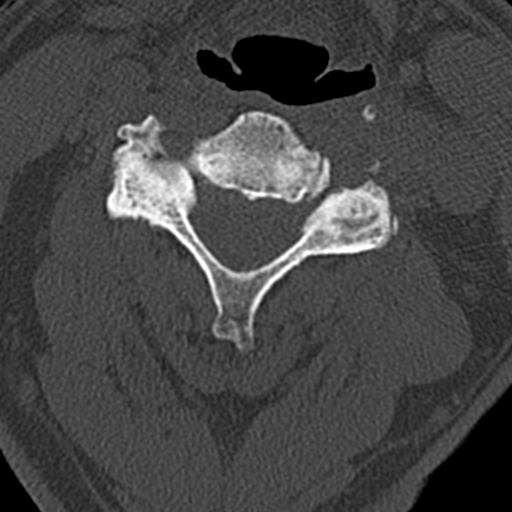
[im 59/88  bone]
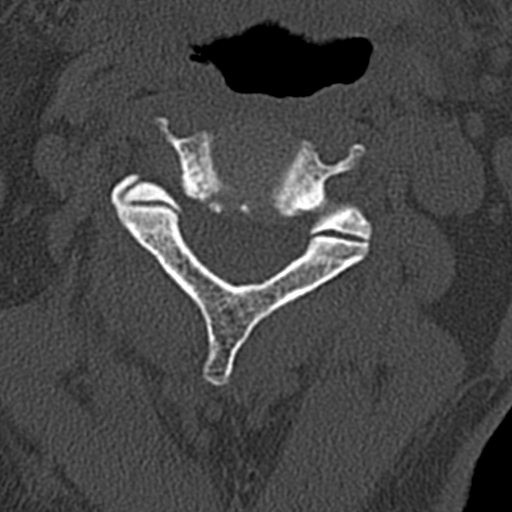
[im 73/88  soft-tissue]
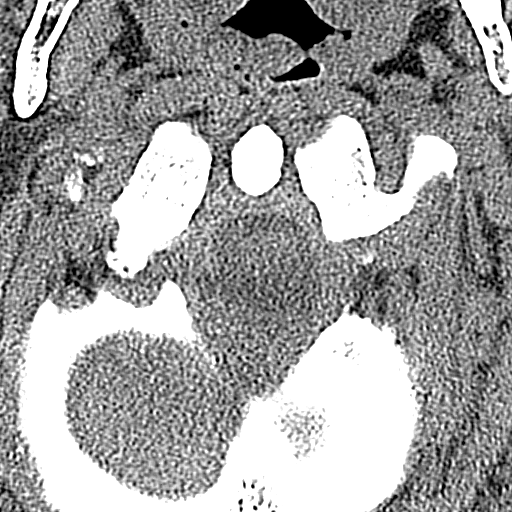
[im 73/88  bone]
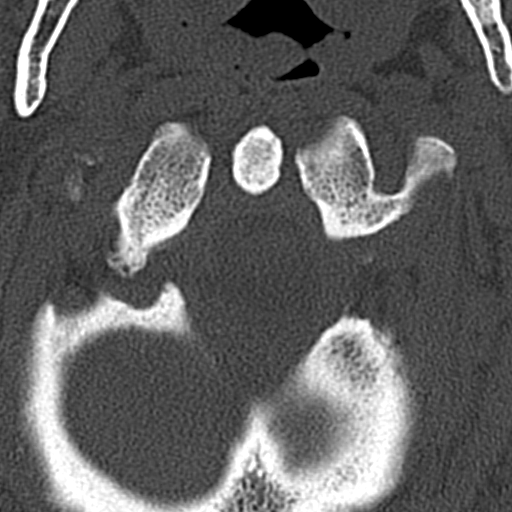

[13 of 33 positions shown; findings below may reference images not displayed]

FINDINGS: Alignment: No traumatic malalignment.

Skull base and vertebrae: No fracture or focal bone lesion. Chronic
vertebral body fusion at C5-6. Prominent bridging anterior
osteophytes at C6-7 and C7-T1.

Soft tissues and spinal canal: No evidence of soft tissue injury.

Disc levels: The foramen magnum is widely patent. There is ordinary
mild osteoarthritis of the C1-2 articulation but no encroachment
upon the neural structures.

C2-3: Uncovertebral osteophytes with mild bilateral foraminal
narrowing.

C3-4: Shallow calcified disc protrusion with mild canal stenosis.
Bilateral bony foraminal stenosis.

C4-5: Bilateral bony foraminal stenosis right worse than left.

C5-6: Chronic fusion.  Sufficient patency of the canal and foramina.

C6-7: Mild foraminal narrowing on the left.

C7-T1: Canal and foramina sufficiently patent.

Upper chest: Negative

Other: None
IMPRESSION: No acute or traumatic finding. Chronic fusion at C5-6. Degenerative
changes above and below that as outlined above.
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
## 2021-09-19 IMAGING — DX DG HIP (WITH OR WITHOUT PELVIS) 2-3V*L*
3 series · 3 of 3 positions shown · non-contrast
Comparison: None.

CLINICAL DATA: Fall, pain

EXAM:
DG HIP (WITH OR WITHOUT PELVIS) 2-3V LEFT

[pelvis ap]
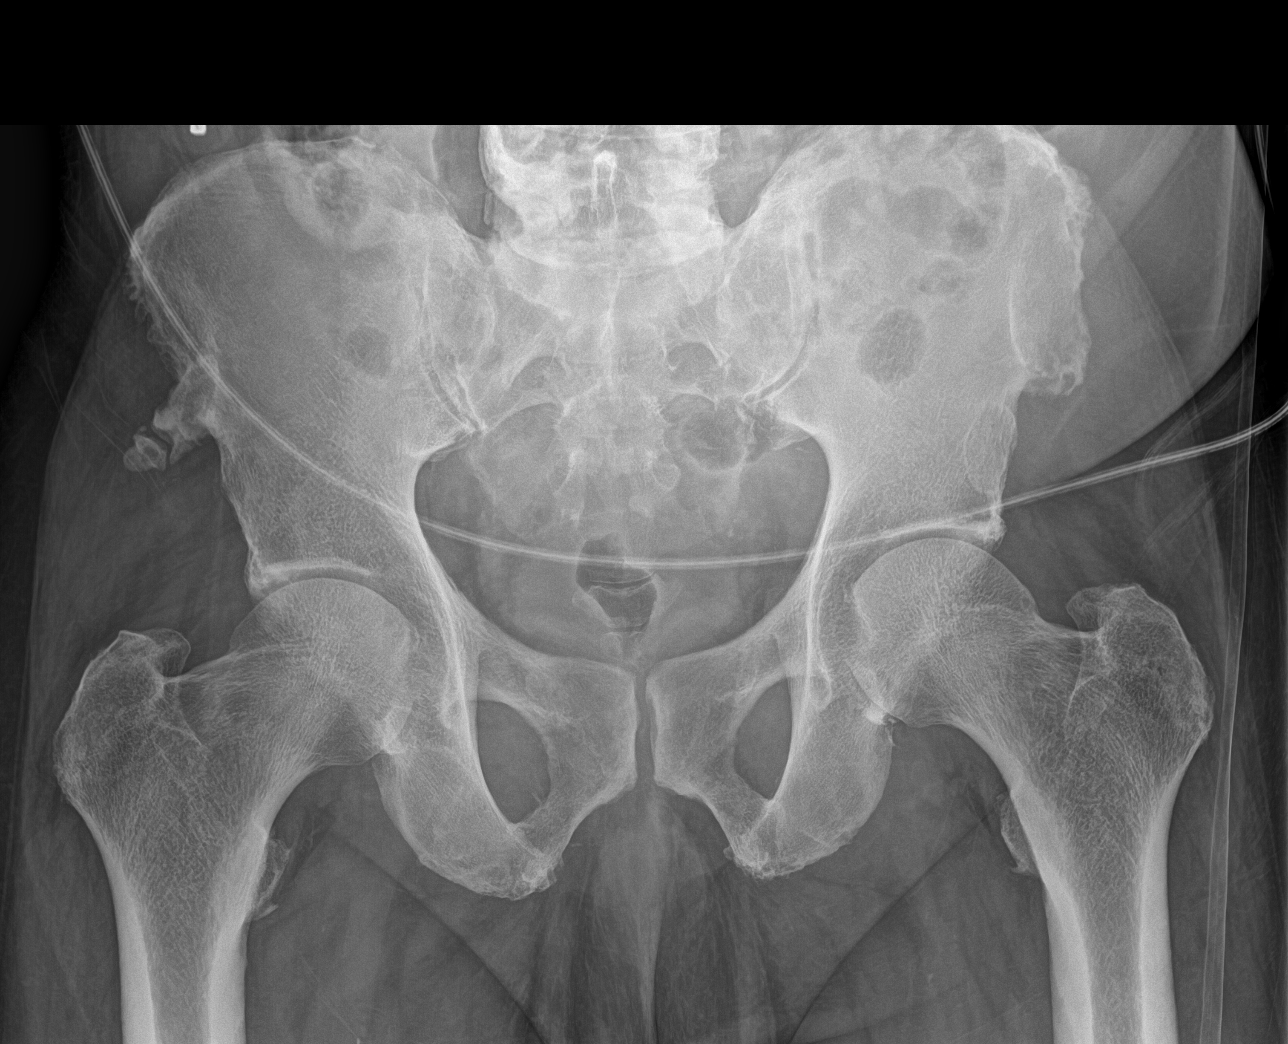

[hip ap]
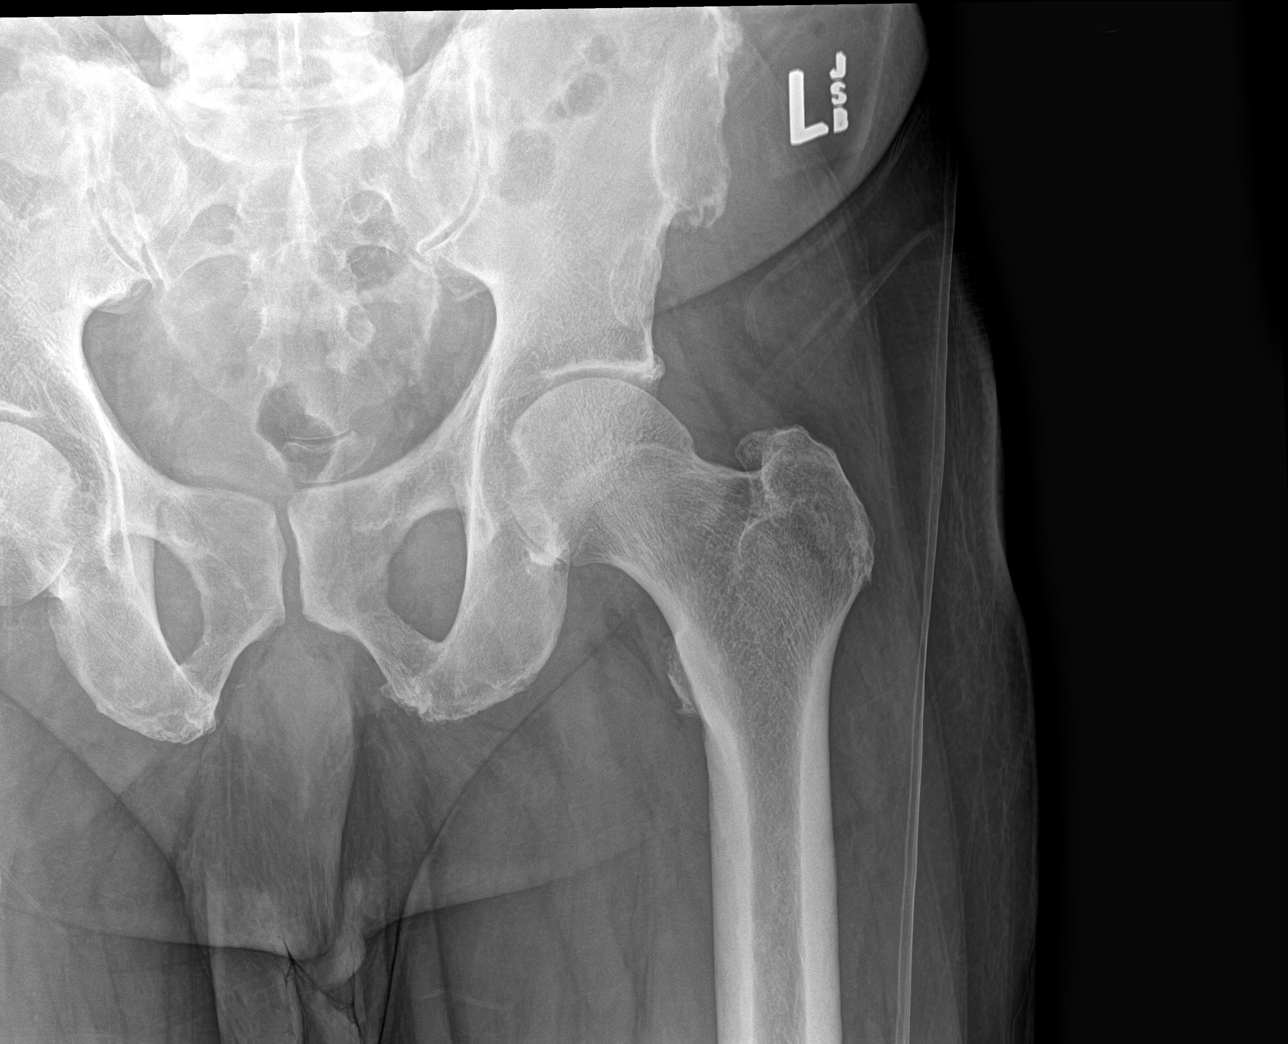

[hip frog leg]
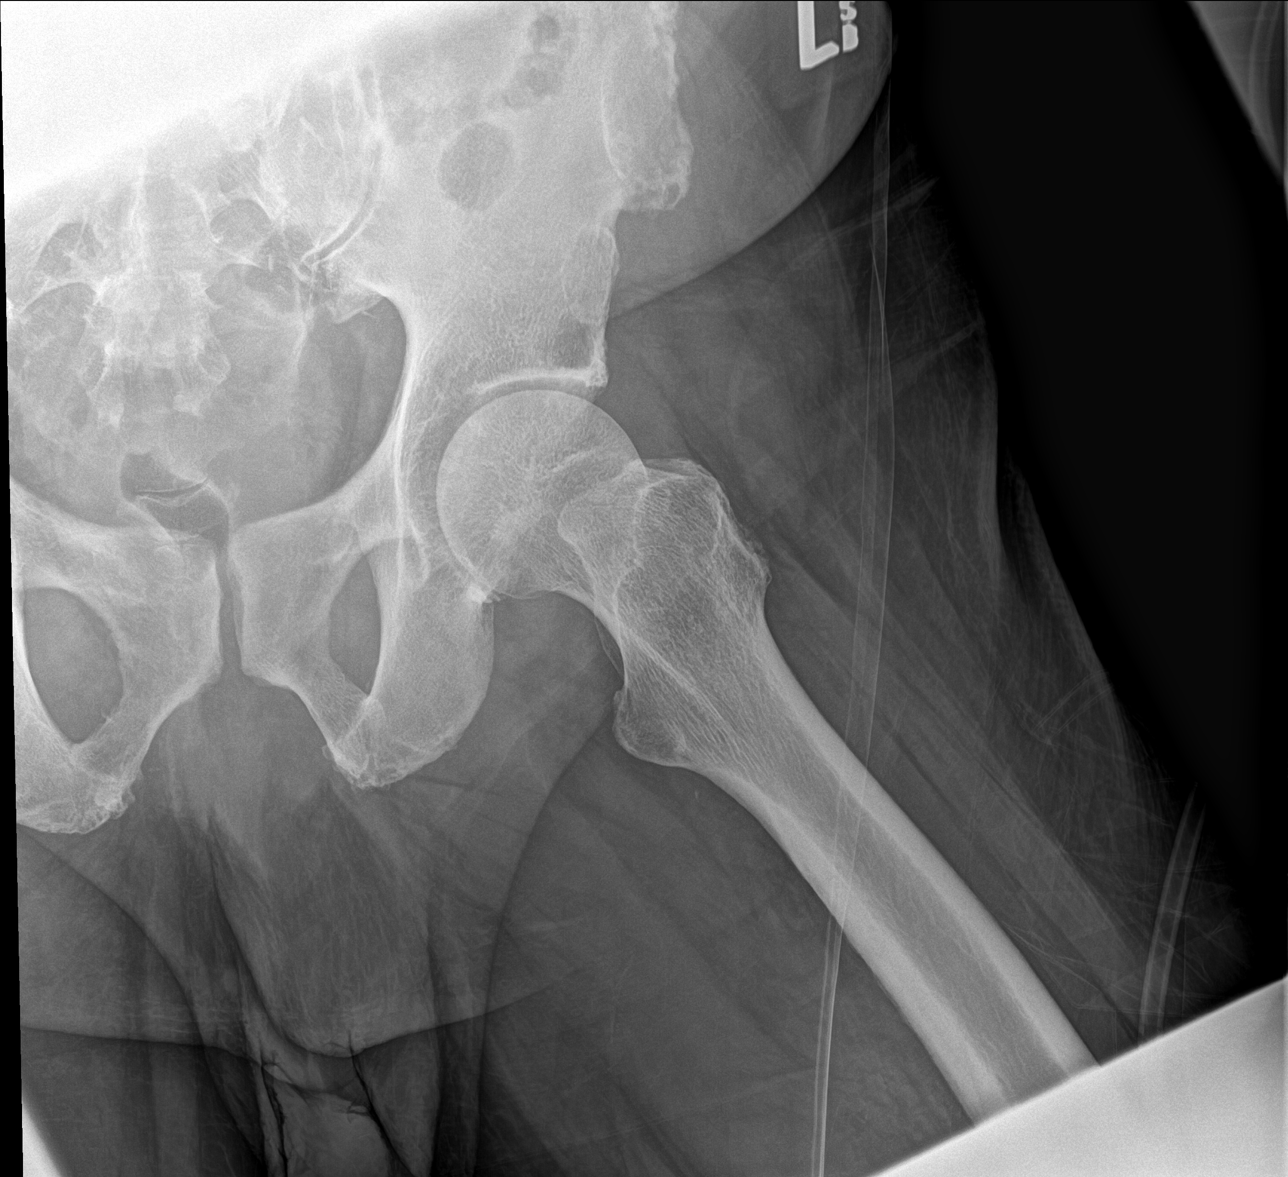

[3 of 3 positions shown; findings below may reference images not displayed]

FINDINGS: There is no evidence of displaced hip fracture or dislocation. There
is no evidence of arthropathy or other focal bone abnormality.
Enthesopathic change about the bilateral iliac bones. Nonobstructive
pattern of overlying bowel gas.
IMPRESSION: No fracture or dislocation of the left hip or included pelvis.
Please note that plain radiographs are significantly insensitive for
hip and pelvic fracture.

## 2021-09-19 IMAGING — DX DG ELBOW COMPLETE 3+V*L*
4 series · 4 of 4 positions shown · non-contrast
Comparison: None.

CLINICAL DATA: Fell with elbow pain.

EXAM:
LEFT ELBOW - COMPLETE 3+ VIEW

[elbow ap]
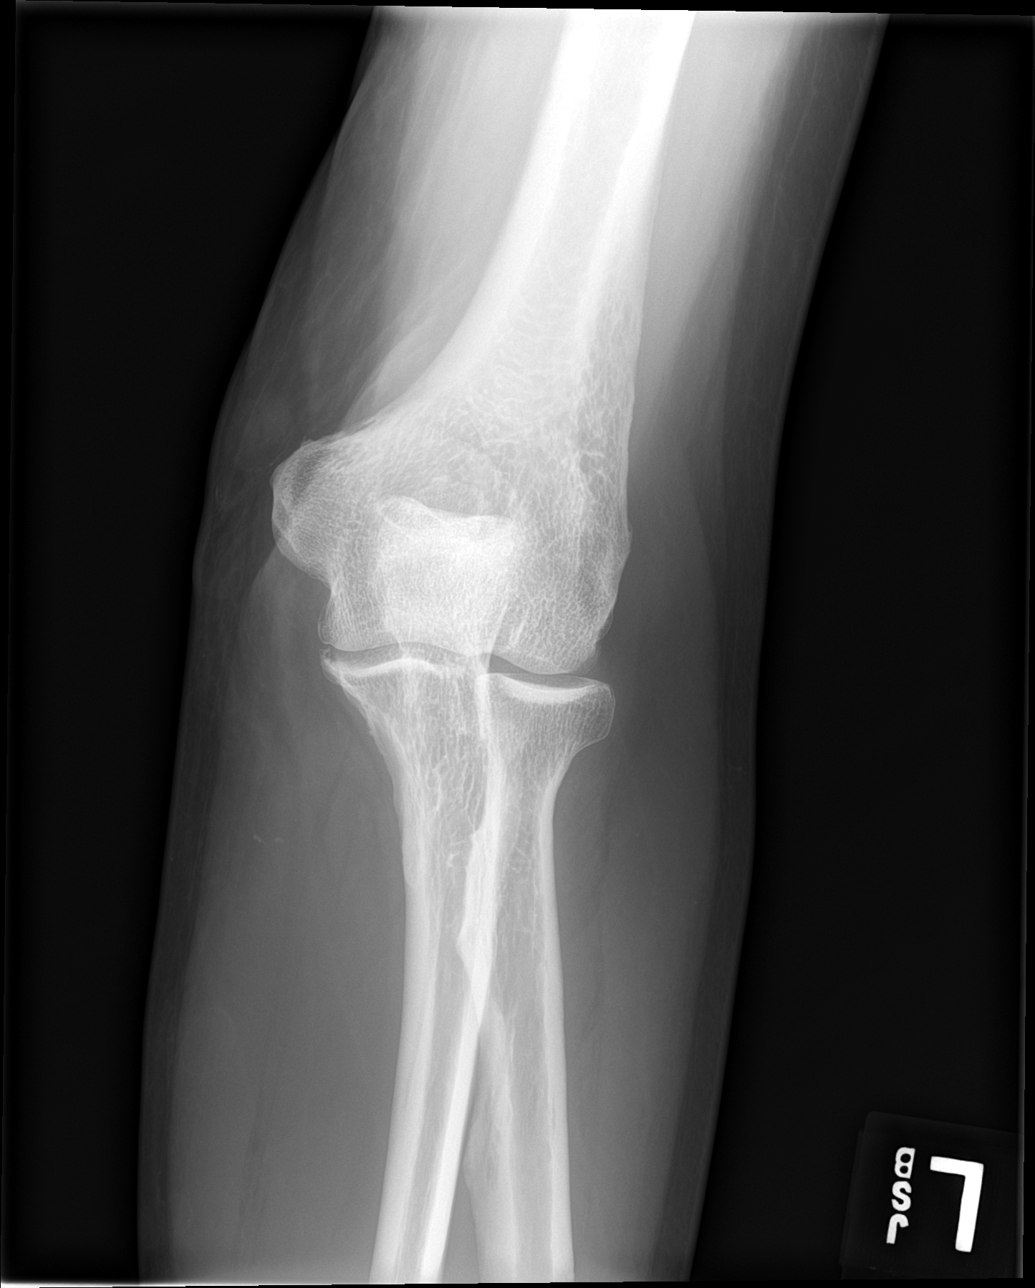

[elbow obl (1 of 2)]
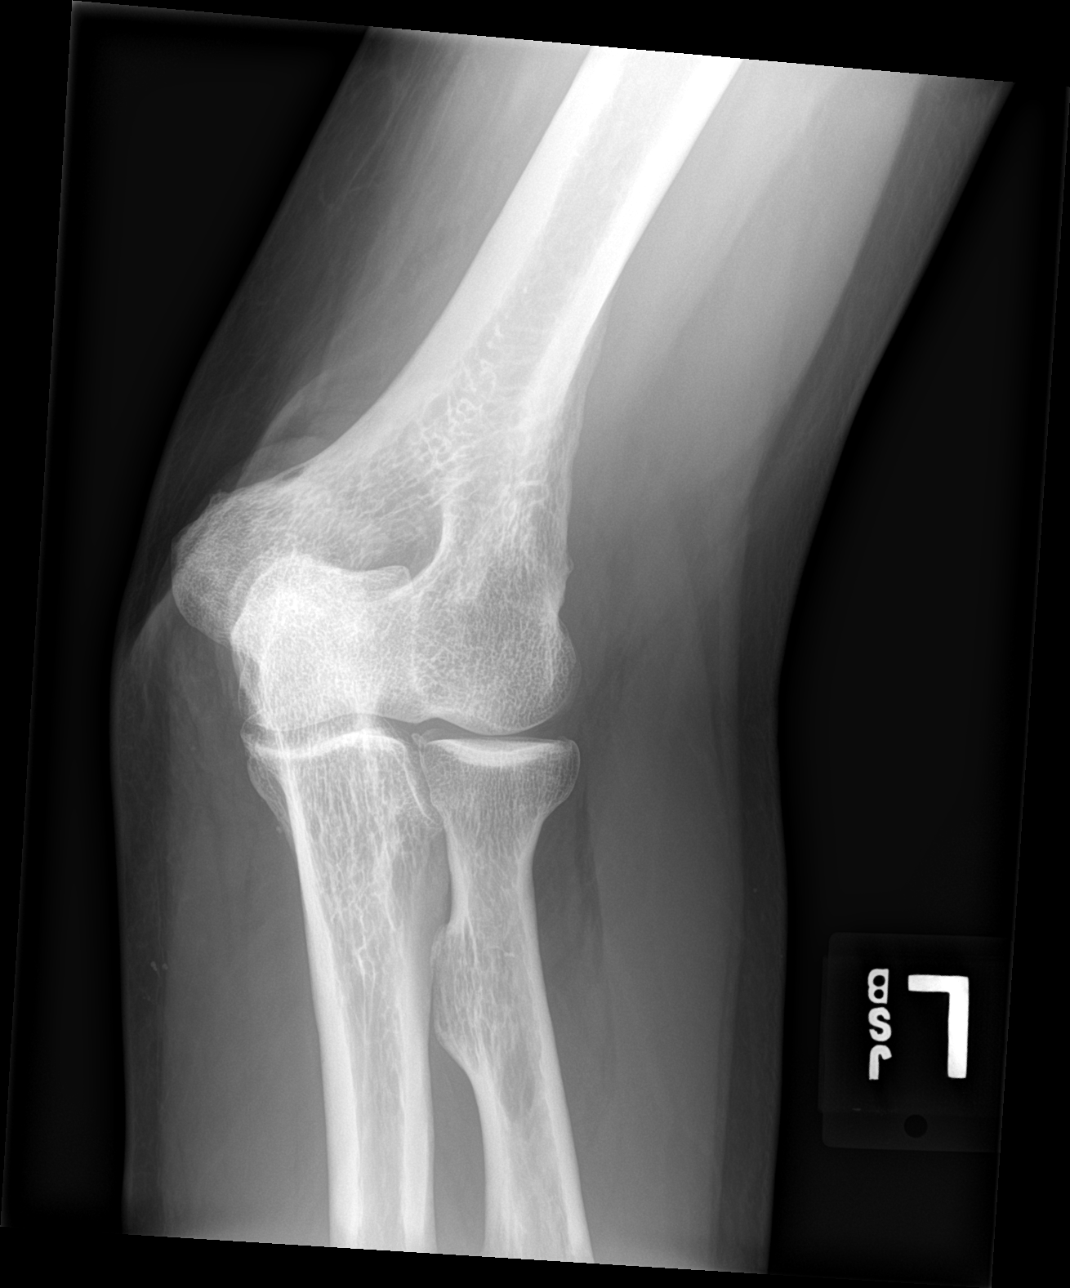

[elbow obl (2 of 2)]
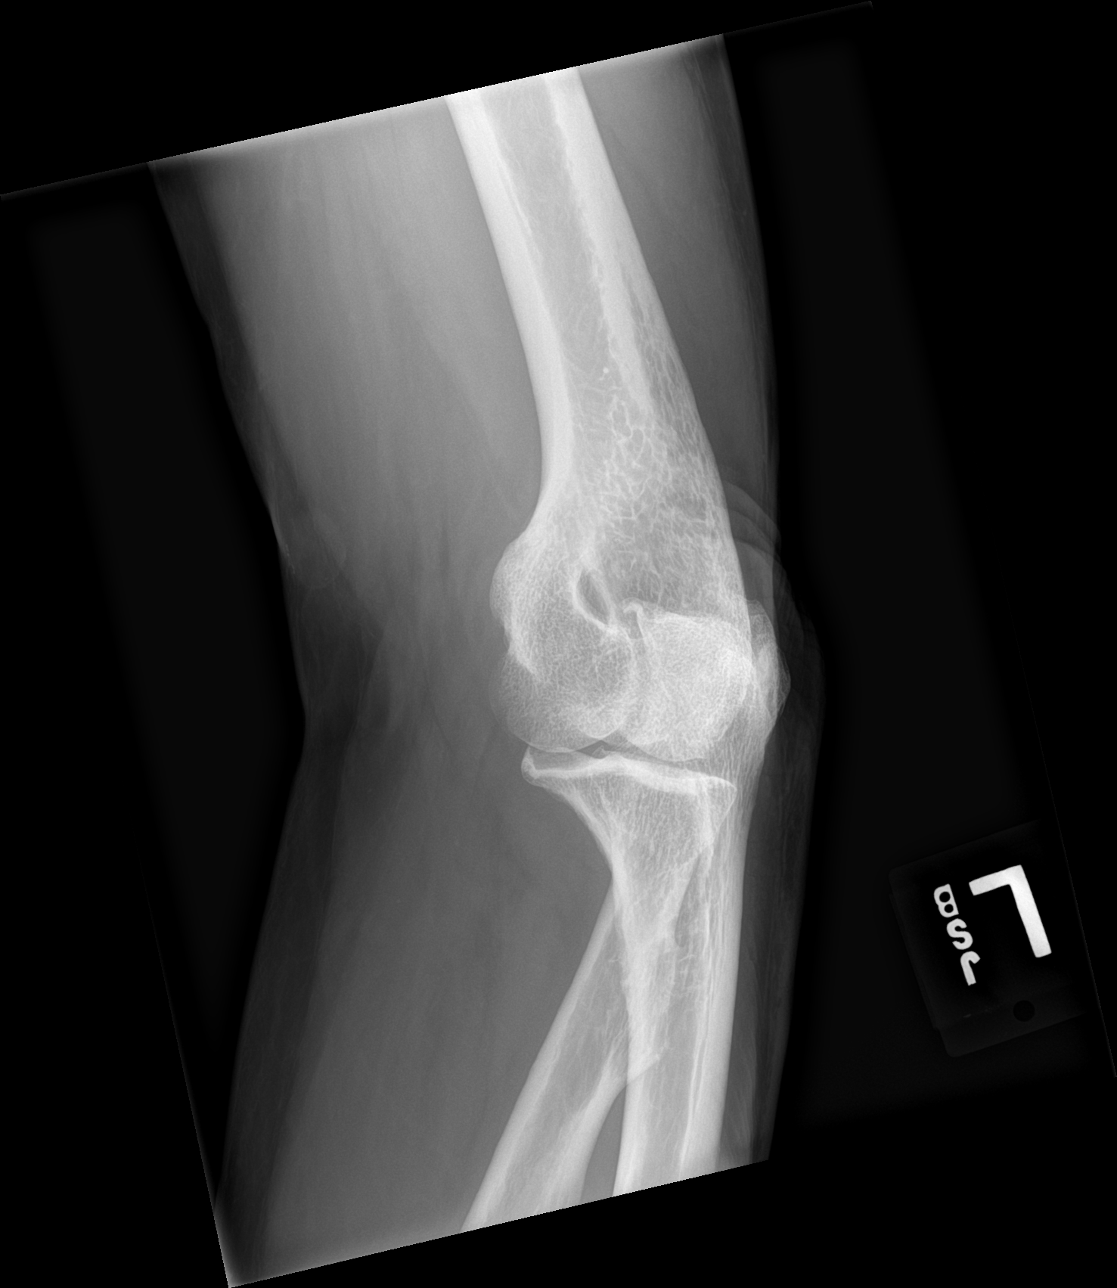

[elbow lat]
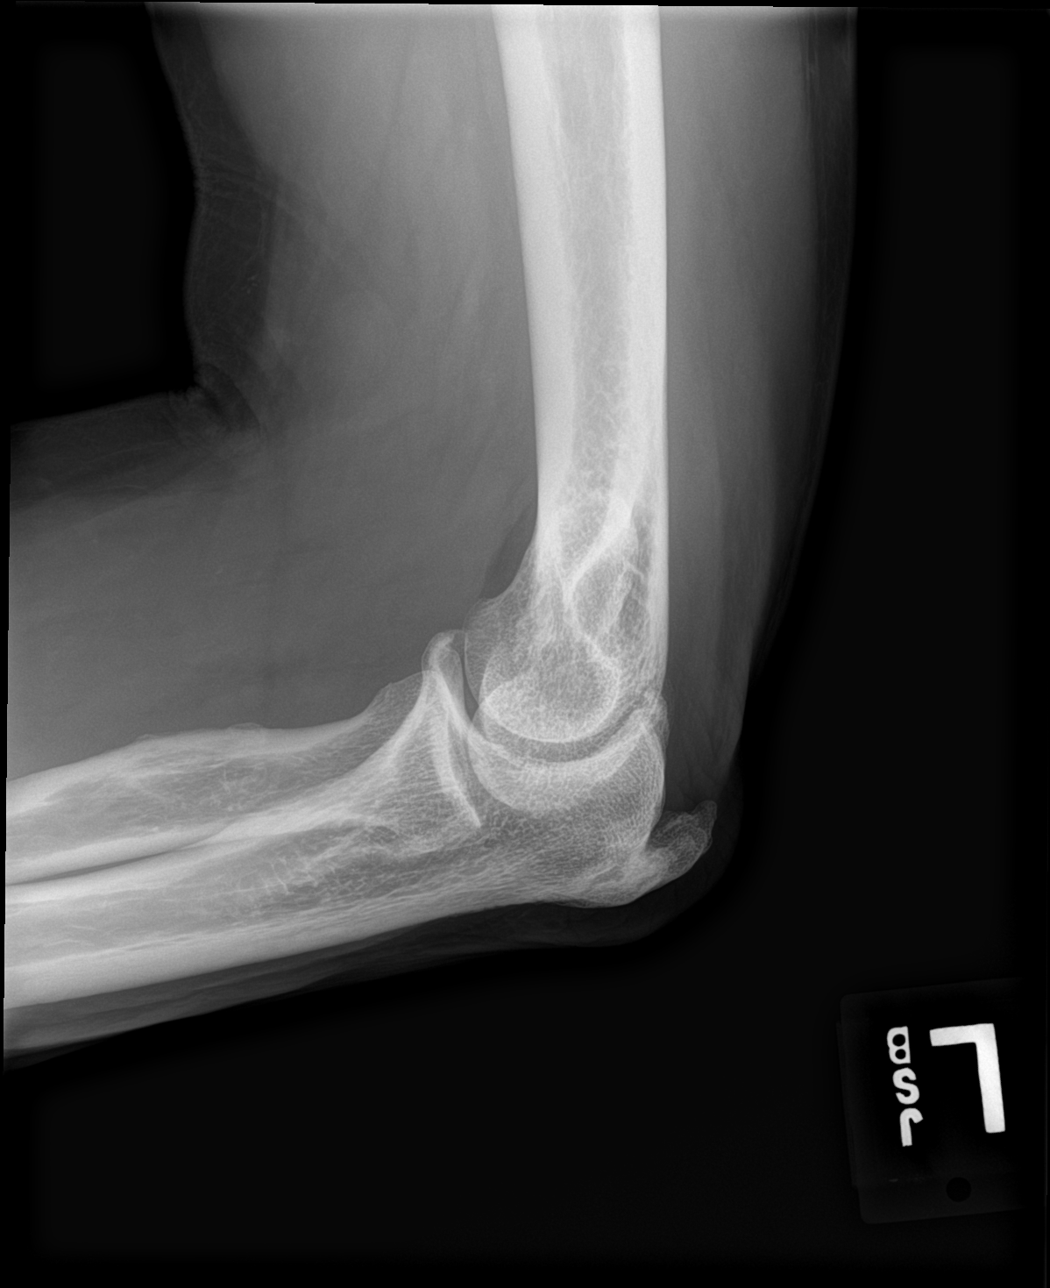

[4 of 4 positions shown; findings below may reference images not displayed]

FINDINGS: No evidence of fracture or dislocation. Prominent olecranon spur
incidentally noted.
IMPRESSION: No acute or traumatic finding.  Prominent olecranon spur.

## 2021-09-19 IMAGING — DX DG CHEST 2V
2 series · 2 of 2 positions shown · non-contrast
Comparison: [DATE]

CLINICAL DATA: Chest pain, fall

EXAM:
CHEST - 2 VIEW

[chest lat]
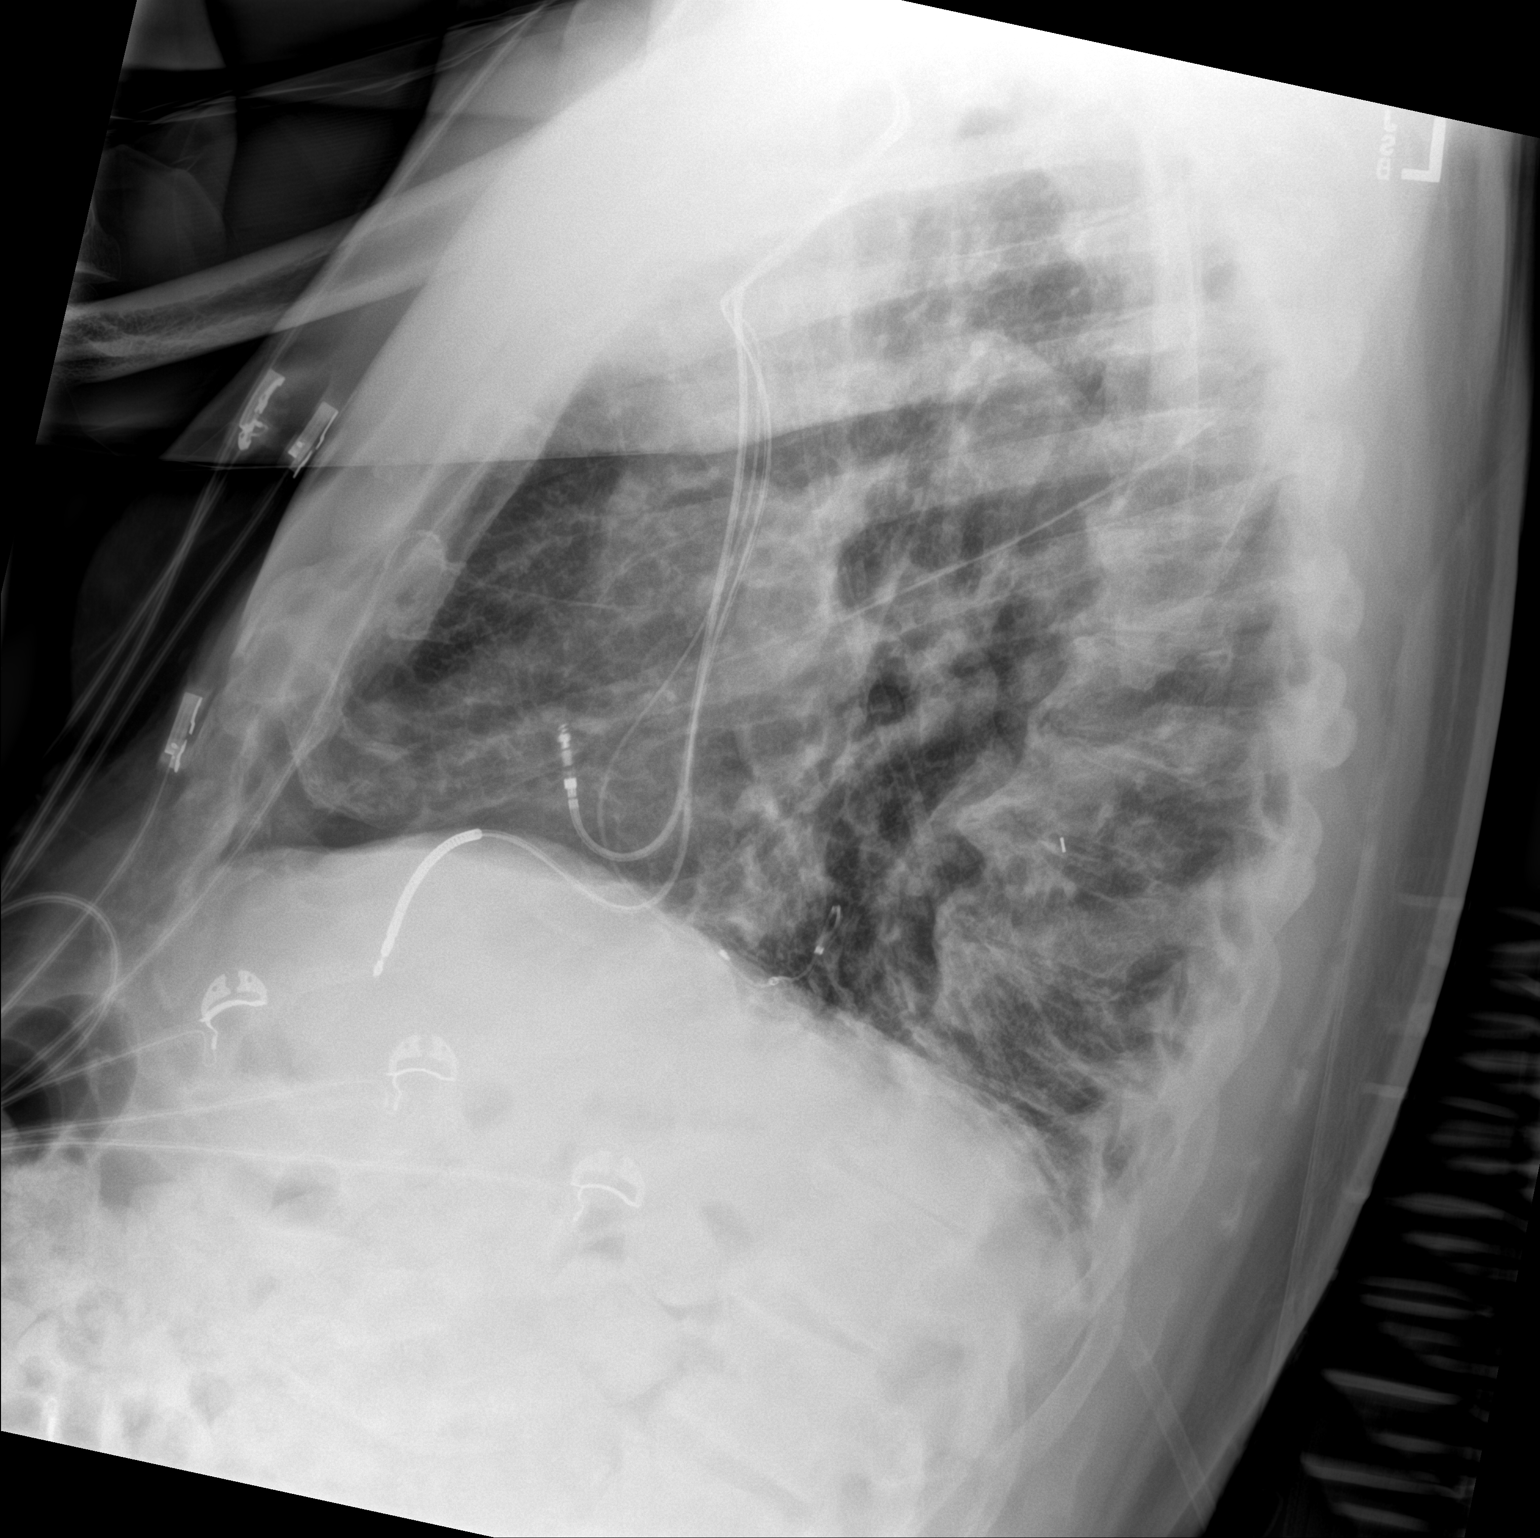

[chest ap]
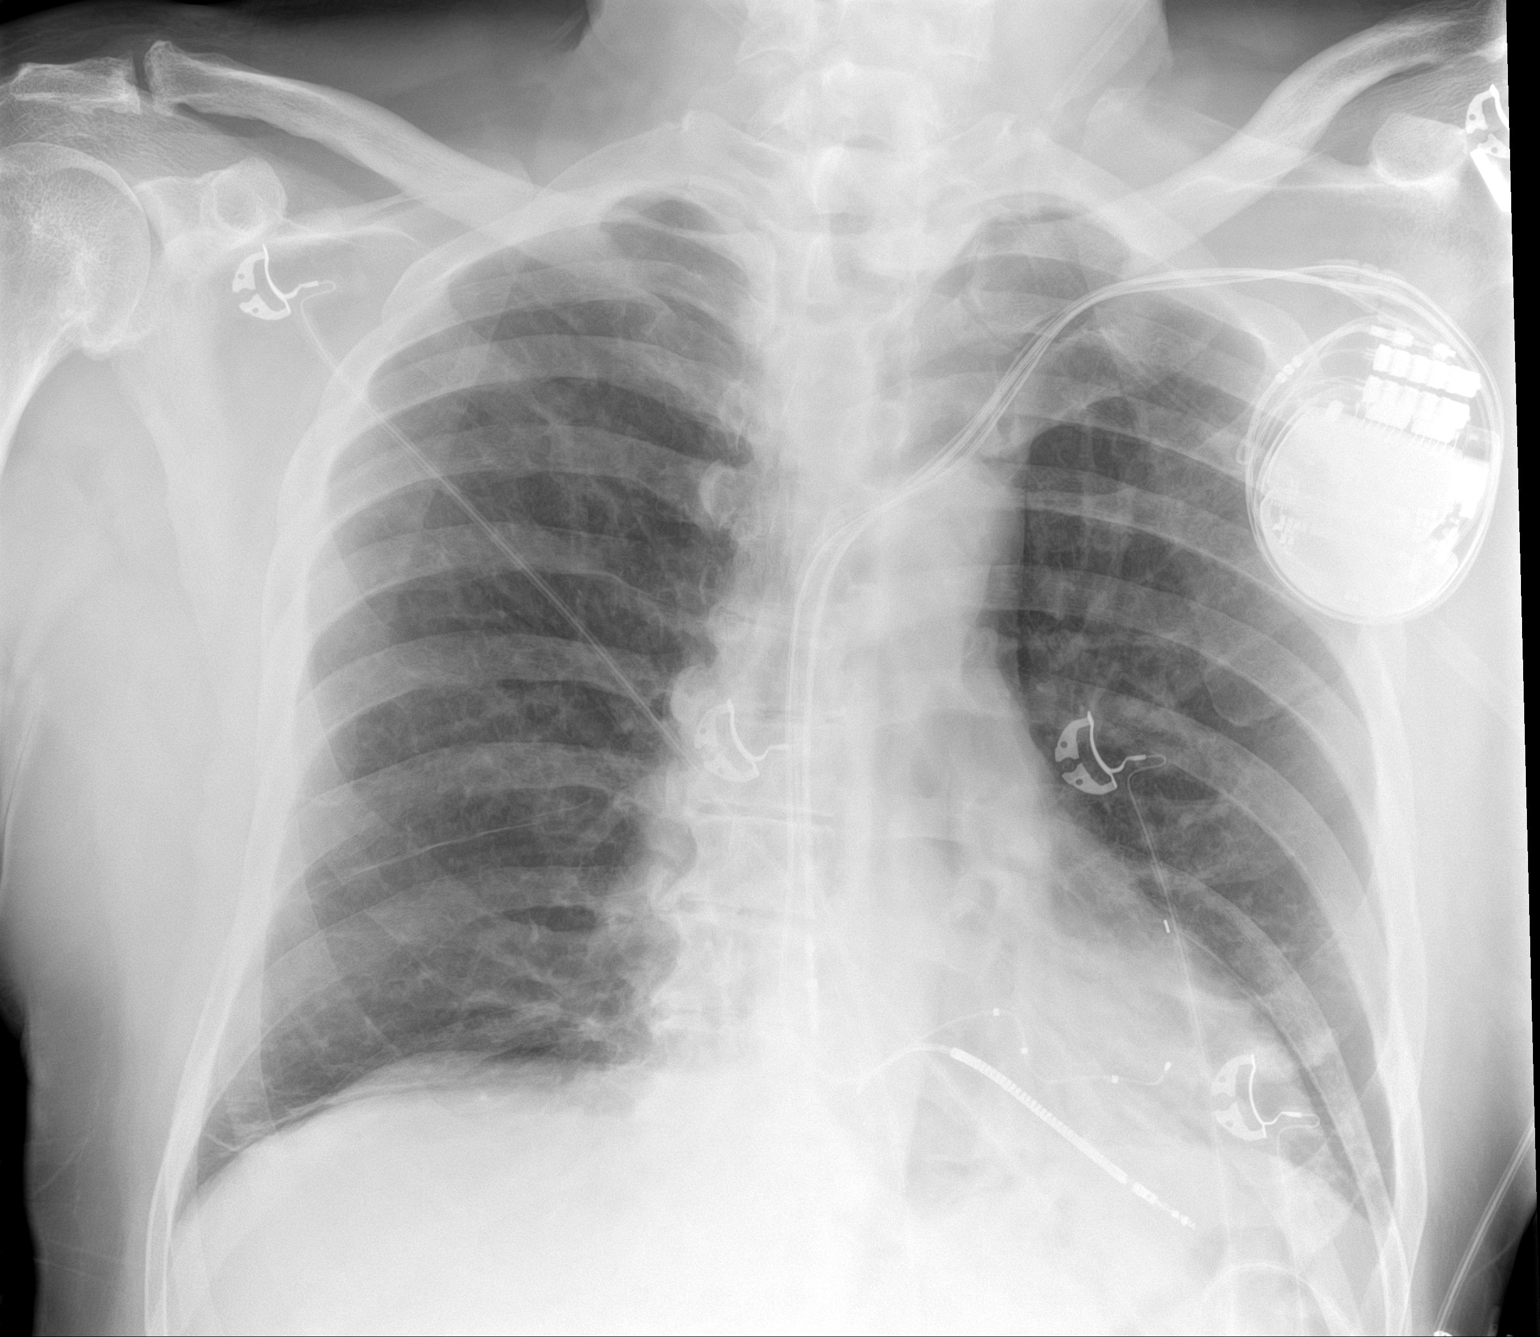

[2 of 2 positions shown; findings below may reference images not displayed]

FINDINGS: Redemonstrated left chest wall AICD with leads in the right atrium,
right ventricle, and coronary sinus, unchanged. Normal cardiac and
mediastinal contours. No new focal pulmonary opacity. Redemonstrated
bilateral lower lung scarring and atelectasis. Redemonstrated left
lower lung fiducial marker. No acute osseous abnormality. No
definite displaced rib fracture. Degenerative changes in the right
shoulder.
IMPRESSION: No active cardiopulmonary disease.

## 2021-09-19 NOTE — ED Triage Notes (Addendum)
Pt to the ED with chest pain that began last night. Pt states he took  2 nitro and the pain subsided.  Pt also got up and had a fall in the middle of the night. Pt states he hit his head when he fell and he take xarelto. Pt is complaining of left hip and elbow pain.  Pt states he has had a productive cough for the past several weeks with green sputum.

## 2021-09-19 NOTE — ED Notes (Signed)
Patient transported to X-ray 

## 2021-09-19 NOTE — Discharge Instructions (Signed)
Continue your current medications.  The x-rays and laboratory test were reassuring today.  No signs of any x-rays or dislocations.  No signs of any internal bleeding.  Your heart tests were normal.  Return as needed for worsening symptoms

## 2021-09-19 NOTE — ED Provider Notes (Signed)
[facility] Provider Note   CSN: [id number] Arrival date & time: 09/19/21  9629     History  Chief Complaint  Patient presents with   Chest Pain   Fall    [address] Roger Shankar. is a 76 y.o. male.   Chest Pain Associated symptoms: fatigue   Associated symptoms: no abdominal pain   Fall Associated symptoms include chest pain. Pertinent negatives include no abdominal pain.   Patient has a history of pneumonia, hypertension, ED, diabetes, heart failure, chronic kidney disease who is on chronic anticoagulation with Xarelto.  Patient states he has had some nonproductive cough for couple of weeks.  He has noticed some green sputum production.  Patient has also noticed decreased taste.  He thinks he may have had fevers but has not measured his temperature.  He has not been eating well.  He has not had any nausea or vomiting and has been able to drink.  Patient states last night he had an episode of chest pain.  He took 2 nitroglycerin and the pain resolved.  Is not any pain since.  Patient however did fall last night when he was going to get a glass of water after taking the nitroglycerin.  Patient thinks he might of stumbled and tripped.  He ended up injuring his elbow and his hip.  He did not lose consciousness.  Symptoms are not severe and he is able to walk.  Home Medications Prior to Admission medications   Medication Sig Start Date End Date Taking? Authorizing Provider  albuterol (VENTOLIN HFA) 108 (90 Base) MCG/ACT inhaler Inhale 1-2 puffs into the lungs every 6 (six) hours as needed for shortness of breath or wheezing. 03/19/20   [provider]  allopurinol (ZYLOPRIM) 300 MG tablet Take 300 mg by mouth daily.     [provider]  amitriptyline (ELAVIL) 50 MG tablet Take 50 mg by mouth 2 (two) times daily.     [provider]  atorvastatin (LIPITOR) 40 MG tablet Take 40 mg by mouth at bedtime. 09/12/20   [provider]  azithromycin  (ZITHROMAX) 250 MG tablet Take 1 tablet by mouth every Monday, Wednesday and Friday 09/09/21   Icard, Leory Plowman L, DO  BROVANA 15 MCG/2ML NEBU USE 1 VIAL  IN  NEBULIZER TWICE  DAILY - Morning And Evening Patient taking differently: Take 15 mcg by nebulization in the morning and at bedtime. 12/17/20   Icard, Bradley L, DO  budesonide (PULMICORT) 0.5 MG/2ML nebulizer solution USE 1 VIAL  IN  NEBULIZER TWICE  DAILY - Rinse Mouth After Treatment Patient taking differently: Take 0.5 mg by nebulization in the morning and at bedtime. 12/17/20   Icard, Octavio Graves, DO  carvedilol (COREG) 25 MG tablet TAKE 1 TABLET TWICE DAILY Patient taking differently: Take 25 mg by mouth in the morning and at bedtime. 03/21/21   Arnoldo Lenis, MD  ergocalciferol (VITAMIN D2) 1.25 MG (50000 UT) capsule Take 50,000 Units by mouth every Tuesday.    [provider]  FARXIGA 5 MG TABS tablet Take 5 mg by mouth daily. 09/12/20   [provider]  furosemide (LASIX) 20 MG tablet Take 40 mg by mouth daily.    [provider]  gabapentin (NEURONTIN) 100 MG capsule Take 100 mg by mouth 3 (three) times daily.  10/28/19   [provider]  hydrALAZINE (APRESOLINE) 50 MG tablet Take 1 tablet (50 mg total) by mouth 3 (three) times daily. 04/19/21   Luiz Blare  Brooke Bonito., NP  Insulin Detemir (LEVEMIR FLEXTOUCH) 100 UNIT/ML Pen Inject 45 Units into the skin at bedtime. 11/03/18   Strader, Fransisco Hertz, PA-C  ipratropium-albuterol (DUONEB) 0.5-2.5 (3) MG/3ML SOLN USE 1 VIAL IN NEBULIZER EVERY 6 HOURS - And As Needed (For Rescue -MAX 30 DOSES PER MONTH) 12/17/20   Icard, Octavio Graves, DO  isosorbide mononitrate (IMDUR) 30 MG 24 hr tablet isosorbide mononitrate ER 30 mg tablet,extended release 24 hr  TAKE 1 TABLET BY MOUTH ONCE DAILY    [provider]  methocarbamol (ROBAXIN) 750 MG tablet Take 750 mg by mouth 2 (two) times daily as needed for muscle spasms. 03/27/21   [provider]  nitroGLYCERIN  (NITROSTAT) 0.4 MG SL tablet PLACE 1 TABLET UNDER THE TONGUE EVERY 5 (FIVE) MINUTES AS NEEDED FOR CHEST PAIN  AS DIRECTED 08/06/20   Strader, Fransisco Hertz, PA-C  NOVOLOG FLEXPEN 100 UNIT/ML FlexPen Inject 25-40 Units into the skin 3 (three) times daily with meals. Sliding scale 02/21/20   [provider]  OVER THE COUNTER MEDICATION Compression vest     [provider]  oxyCODONE-acetaminophen (PERCOCET) 10-325 MG tablet Take 1 tablet by mouth every 6 (six) hours as needed for pain.    [provider]  OXYGEN Inhale 2-4 L into the lungs continuous.    [provider]  pantoprazole (PROTONIX) 40 MG tablet Take 1 tablet (40 mg total) by mouth 2 (two) times daily. 05/09/21 05/09/22  Manuella Ghazi, Pratik D, DO  RELION PEN NEEDLES 31G X 6 MM MISC USE 1 PEN NEEDLE 4 TIMES DAILY 08/12/19   [provider]  rivaroxaban (XARELTO) 20 MG TABS tablet Take 1 tablet (20 mg total) by mouth every morning. 02/03/20   Arnoldo Lenis, MD  sodium chloride (OCEAN) 0.65 % SOLN nasal spray Place 1 spray into both nostrils as needed for congestion.    [provider]  spironolactone (ALDACTONE) 25 MG tablet TAKE 1 TABLET (25 MG TOTAL) BY MOUTH DAILY. 12/21/20 12/16/21  Arnoldo Lenis, MD  Tiotropium Bromide Monohydrate (SPIRIVA RESPIMAT) 2.5 MCG/ACT AERS Inhale 2 puffs into the lungs daily. 12/31/20   Garner Nash, DO      Allergies    Sacubitril-valsartan    Review of Systems   Review of Systems  Constitutional:  Positive for fatigue.  Cardiovascular:  Positive for chest pain.  Gastrointestinal:  Negative for abdominal pain.   Physical Exam Updated Vital Signs BP 134/80    Pulse (!) 107    Temp 98.8 F (37.1 C) (Oral)    Resp 18    Ht 1.829 m (6')    Wt 105 kg    SpO2 96%    BMI 31.39 kg/m  Physical Exam Vitals and nursing note reviewed.  Constitutional:      General: He is not in acute distress.    Appearance: He is well-developed.  HENT:     Head: Normocephalic  and atraumatic.     Right Ear: External ear normal.     Left Ear: External ear normal.  Eyes:     General: No scleral icterus.       Right eye: No discharge.        Left eye: No discharge.     Conjunctiva/sclera: Conjunctivae normal.  Neck:     Trachea: No tracheal deviation.  Cardiovascular:     Rate and Rhythm: Regular rhythm. Tachycardia present.  Pulmonary:     Effort: Pulmonary effort is normal. No respiratory distress.  Breath sounds: Normal breath sounds. No stridor. No wheezing or rales.  Abdominal:     General: Bowel sounds are normal. There is no distension.     Palpations: Abdomen is soft.     Tenderness: There is no abdominal tenderness. There is no guarding or rebound.  Musculoskeletal:        General: No tenderness or deformity.     Cervical back: Neck supple.     Right lower leg: No edema.     Left lower leg: No edema.     Comments: Mild tenderness palpation left elbow and left hip, full range of motion without discomfort, no effusions, no spinal tenderness cervical thoracic or lumbar  Skin:    General: Skin is warm and dry.     Findings: No rash.  Neurological:     General: No focal deficit present.     Mental Status: He is alert.     Cranial Nerves: No cranial nerve deficit (no facial droop, extraocular movements intact, no slurred speech).     Sensory: No sensory deficit.     Motor: No abnormal muscle tone or seizure activity.     Coordination: Coordination normal.  Psychiatric:        Mood and Affect: Mood normal.    ED Results / Procedures / Treatments   Labs (all labs ordered are listed, but only abnormal results are displayed) Labs Reviewed  BASIC METABOLIC PANEL - Abnormal; Notable for the following components:      Result Value   Glucose, Bld 154 (*)    Creatinine, Ser 1.27 (*)    GFR, Estimated 60 (*)    All other components within normal limits  RESP PANEL BY RT-PCR (FLU A&B, COVID) ARPGX2  CBC  TROPONIN I (HIGH SENSITIVITY)  TROPONIN I  (HIGH SENSITIVITY)    EKG EKG Interpretation  Date/Time:  Thursday September 19 2021 10:42:17 EST Ventricular Rate:  108 PR Interval:  187 QRS Duration: 113 QT Interval:  358 QTC Calculation: 480 R Axis:   -31 Text Interpretation: Sinus tachycardia Borderline IVCD with LAD Inferior infarct, old No significant change since last tracing Confirmed by Dorie Rank [phone]) on 09/19/2021 10:45:38 AM  Radiology DG Chest 2 View  Result Date: 09/19/2021 CLINICAL DATA:  Chest pain, fall EXAM: CHEST - 2 VIEW COMPARISON:  07/09/2021 FINDINGS: Redemonstrated left chest wall AICD with leads in the right atrium, right ventricle, and coronary sinus, unchanged. Normal cardiac and mediastinal contours. No new focal pulmonary opacity. Redemonstrated bilateral lower lung scarring and atelectasis. Redemonstrated left lower lung fiducial marker. No acute osseous abnormality. No definite displaced rib fracture. Degenerative changes in the right shoulder. IMPRESSION: No active cardiopulmonary disease. Electronically Signed   By: Merilyn Baba M.D.   On: 09/19/2021 11:24   DG Elbow Complete Left  Result Date: 09/19/2021 CLINICAL DATA:  [place] [address] with elbow pain. EXAM: LEFT ELBOW - COMPLETE 3+ VIEW COMPARISON:  None. FINDINGS: No evidence of fracture or dislocation. Prominent olecranon spur incidentally noted. IMPRESSION: No acute or traumatic finding.  Prominent olecranon spur. Electronically Signed   By: Nelson Chimes M.D.   On: 09/19/2021 11:24   CT Head Wo Contrast  Result Date: 09/19/2021 CLINICAL DATA:  Head trauma, moderate to severe. Fell last night. Anticoagulated. EXAM: CT HEAD WITHOUT CONTRAST TECHNIQUE: Contiguous axial images were obtained from the base of the skull through the vertex without intravenous contrast. RADIATION DOSE REDUCTION: This exam was performed according to the departmental dose-optimization program which includes automated exposure control, adjustment  of the mA and/or kV according to patient  size and/or use of iterative reconstruction technique. COMPARISON:  07/22/2021 FINDINGS: Brain: Age related volume loss. 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 No evidence of old or acute supra tentorial focal infarction, mass lesion, hemorrhage, hydrocephalus or extra-axial collection. Vascular: There is atherosclerotic calcification of the major vessels at the base of the brain. Skull: Negative.  No fracture. Sinuses/Orbits: Chronic inflammatory changes of the paranasal sinuses, particularly affecting the left maxillary sinus presently. Orbits negative. Other: Cerumen impaction of the external auditory canals. IMPRESSION: No acute traumatic finding. No skull fracture. No intracranial injury. Age related volume loss. Inflammatory changes of the sinuses, particularly affecting the left maxillary sinus presently. Cerumen impaction of the external auditory canals. Electronically Signed   By: Nelson Chimes M.D.   On: 09/19/2021 11:27   CT Cervical Spine Wo Contrast  Result Date: 09/19/2021 CLINICAL DATA:  [place] last night with trauma to the head and neck. Anticoagulated. EXAM: CT CERVICAL SPINE WITHOUT CONTRAST TECHNIQUE: Multidetector CT imaging of the cervical spine was performed without intravenous contrast. Multiplanar CT image reconstructions were also generated. RADIATION DOSE REDUCTION: This exam was performed according to the departmental dose-optimization program which includes automated exposure control, adjustment of the mA and/or kV according to patient size and/or use of iterative reconstruction technique. COMPARISON:  03/21/2020 FINDINGS: Alignment: No traumatic malalignment. Skull base and vertebrae: No fracture or focal bone lesion. Chronic vertebral body fusion at C5-6. Prominent bridging anterior osteophytes at C6-7 and C7-T1. Soft tissues and spinal canal: No evidence of soft tissue injury. Disc levels: The foramen magnum is widely patent. There is ordinary mild osteoarthritis of the C1-2  articulation but no encroachment upon the neural structures. C2-3: Uncovertebral osteophytes with mild bilateral foraminal narrowing. C3-4: Shallow calcified disc protrusion with mild canal stenosis. Bilateral bony foraminal stenosis. C4-5: Bilateral bony foraminal stenosis right worse than left. C5-6: Chronic fusion.  Sufficient patency of the canal and foramina. C6-7: Mild foraminal narrowing on the left. C7-T1: Canal and foramina sufficiently patent. Upper chest: Negative Other: None IMPRESSION: No acute or traumatic finding. Chronic fusion at C5-6. Degenerative changes above and below that as outlined above. Electronically Signed   By: Nelson Chimes M.D.   On: 09/19/2021 11:30   DG Hip Unilat With Pelvis 2-3 Views Left  Result Date: 09/19/2021 CLINICAL DATA:  Fall, pain EXAM: DG HIP (WITH OR WITHOUT PELVIS) 2-3V LEFT COMPARISON:  None. FINDINGS: There is no evidence of displaced hip fracture or dislocation. There is no evidence of arthropathy or other focal bone abnormality. Enthesopathic change about the bilateral iliac bones. Nonobstructive pattern of overlying bowel gas. IMPRESSION: No fracture or dislocation of the left hip or included pelvis. Please note that plain radiographs are significantly insensitive for hip and pelvic fracture. Electronically Signed   By: Delanna Ahmadi M.D.   On: 09/19/2021 11:35    Procedures Procedures    Medications Ordered in ED Medications - No data to display  ED Course/ Medical Decision Making/ A&P Clinical Course as of 09/19/21 1302  Thu Sep 19, 2021  1247 Troponin I (High Sensitivity) Serial troponins normal [JK]  8119 Basic metabolic panel(!) Metabolic panel shows slight increase in creatinine, similar to previous values [JK]  1248 Resp Panel by RT-PCR (Flu A&B, Covid) Nasopharyngeal Swab COVID and flu are negative [JK]  1249 Radiology images and reports reviewed.  No evidence of fracture in the hip elbow or chest.  CT scans of the head and neck without  acute  findings [JK]  1302 DG Hip Unilat With Pelvis 2-3 Views Left [JK]    Clinical Course User Index [JK] Dorie Rank, MD                           Medical Decision Making Amount and/or Complexity of Data Reviewed Independent Historian: caregiver Labs: ordered. Decision-making details documented in ED Course. Radiology: ordered and independent interpretation performed. Decision-making details documented in ED Course.   Fall Patient had mechanical fall at home.  Evaluated for the possibility of serious head injury considering his anticoagulant use.  CT scan fortunately reassuring.  X-rays also ordered of his elbow and hip but no signs of any acute fracture.  Patient able to ambulate and he does have some chronic hip issues so I doubt occult fracture.  Do not feel that additional imaging such as CT MRI necessary at this time.  Chest pain Patient does have history of cardiac disease.  He did take nitroglycerin last evening.  Fortunately ED work-up is reassuring.  No signs of acute coronary syndrome with normal EKG and serial troponins.  Patient is not having any shortness of breath.  X-ray does not show evidence of pneumonia pneumothorax or other acute etiology.  Discussed findings with the patient.  Appears appropriate for discharge and outpatient management.  No indication for hospitalization at this time.  Patient is comfortable with this plan.       Final Clinical Impression(s) / ED Diagnoses Final diagnoses:  Fall in home, initial encounter  Chest pain, unspecified type  Minor head injury, initial encounter  Hip pain  Left elbow pain    Rx / DC Orders ED Discharge Orders     None         Dorie Rank, MD 09/19/21 1305

## 2021-09-23 ENCOUNTER — Telehealth: Payer: Self-pay | Admitting: Acute Care

## 2021-09-24 MED ORDER — AZITHROMYCIN 500 MG PO TABS: 500.0000 mg | ORAL_TABLET | ORAL | 2 refills | Status: DC

## 2021-09-24 NOTE — Addendum Note (Signed)
Addended by: Valerie Salts on: 09/24/2021 12:19 PM   Modules accepted: Orders

## 2021-09-25 NOTE — Telephone Encounter (Signed)
Provider sent in a new prescription.

## 2021-09-26 ENCOUNTER — Other Ambulatory Visit: Payer: Self-pay

## 2021-09-26 ENCOUNTER — Ambulatory Visit (INDEPENDENT_AMBULATORY_CARE_PROVIDER_SITE_OTHER): Payer: Medicare HMO | Admitting: Infectious Diseases

## 2021-09-26 VITALS — BP 121/78 | HR 103

## 2021-09-26 DIAGNOSIS — R911 Solitary pulmonary nodule: Secondary | ICD-10-CM | POA: Diagnosis not present

## 2021-09-26 DIAGNOSIS — A318 Other mycobacterial infections: Secondary | ICD-10-CM | POA: Diagnosis not present
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
## 2021-09-27 NOTE — Telephone Encounter (Signed)
Left voicemail for patient to call back to discuss CT appointment and ov. We have called 3 times regarding his appointment 2/16 and sent a mychart message, but was never rescheduled. I went ahead and rescheduled patient for 2/17 at 9am.

## 2021-10-02 ENCOUNTER — Encounter (HOSPITAL_COMMUNITY): Payer: Self-pay

## 2021-10-02 ENCOUNTER — Other Ambulatory Visit: Payer: Self-pay

## 2021-10-02 ENCOUNTER — Emergency Department (HOSPITAL_COMMUNITY)
Admission: EM | Admit: 2021-10-02 | Discharge: 2021-10-02 | Disposition: A | Discharge status: Home / Self Care | Payer: Medicare HMO | Attending: Emergency Medicine | Admitting: Emergency Medicine

## 2021-10-02 DIAGNOSIS — R1310 Dysphagia, unspecified: Secondary | ICD-10-CM | POA: Diagnosis not present

## 2021-10-02 DIAGNOSIS — Z85118 Personal history of other malignant neoplasm of bronchus and lung: Secondary | ICD-10-CM | POA: Insufficient documentation

## 2021-10-02 DIAGNOSIS — R627 Adult failure to thrive: Secondary | ICD-10-CM | POA: Diagnosis not present

## 2021-10-02 DIAGNOSIS — R634 Abnormal weight loss: Secondary | ICD-10-CM | POA: Insufficient documentation

## 2021-10-02 DIAGNOSIS — R5381 Other malaise: Secondary | ICD-10-CM

## 2021-10-02 DIAGNOSIS — Z7901 Long term (current) use of anticoagulants: Secondary | ICD-10-CM | POA: Insufficient documentation

## 2021-10-02 DIAGNOSIS — Z794 Long term (current) use of insulin: Secondary | ICD-10-CM | POA: Diagnosis not present

## 2021-10-02 DIAGNOSIS — E119 Type 2 diabetes mellitus without complications: Secondary | ICD-10-CM | POA: Diagnosis not present

## 2021-10-02 LAB — COMPREHENSIVE METABOLIC PANEL
ALT: 39 U/L (ref 0–44)
AST: 34 U/L (ref 15–41)
Albumin: 3.4 g/dL — ABNORMAL LOW (ref 3.5–5.0)
Alkaline Phosphatase: 83 U/L (ref 38–126)
Anion gap: 7 (ref 5–15)
BUN: 19 mg/dL (ref 8–23)
CO2: 24 mmol/L (ref 22–32)
Calcium: 9.7 mg/dL (ref 8.9–10.3)
Chloride: 102 mmol/L (ref 98–111)
Creatinine, Ser: 1.42 mg/dL — ABNORMAL HIGH (ref 0.61–1.24)
GFR, Estimated: 52 mL/min — ABNORMAL LOW (ref >60)
Glucose, Bld: 126 mg/dL — ABNORMAL HIGH (ref 70–99)
Potassium: 5.1 mmol/L (ref 3.5–5.1)
Sodium: 133 mmol/L — ABNORMAL LOW (ref 135–145)
Total Bilirubin: 0.5 mg/dL (ref 0.3–1.2)
Total Protein: 9.6 g/dL — ABNORMAL HIGH (ref 6.5–8.1)

## 2021-10-02 LAB — CBC WITH DIFFERENTIAL/PLATELET
Abs Immature Granulocytes: 0.06 10*3/uL (ref 0.00–0.07)
Basophils Absolute: 0.1 10*3/uL (ref 0.0–0.1)
Basophils Relative: 1 %
Eosinophils Absolute: 1.2 10*3/uL — ABNORMAL HIGH (ref 0.0–0.5)
Eosinophils Relative: 12 %
HCT: 47.4 % (ref 39.0–52.0)
Hemoglobin: 14.9 g/dL (ref 13.0–17.0)
Immature Granulocytes: 1 %
Lymphocytes Relative: 16 %
Lymphs Abs: 1.7 10*3/uL (ref 0.7–4.0)
MCH: 29.3 pg (ref 26.0–34.0)
MCHC: 31.4 g/dL (ref 30.0–36.0)
MCV: 93.3 fL (ref 80.0–100.0)
Monocytes Absolute: 1 10*3/uL (ref 0.1–1.0)
Monocytes Relative: 10 %
Neutro Abs: 6.3 10*3/uL (ref 1.7–7.7)
Neutrophils Relative %: 60 %
Platelets: 324 10*3/uL (ref 150–400)
RBC: 5.08 MIL/uL (ref 4.22–5.81)
RDW: 13.7 % (ref 11.5–15.5)
WBC: 10.4 10*3/uL (ref 4.0–10.5)
nRBC: 0 % (ref 0.0–0.2)

## 2021-10-02 MED ORDER — SODIUM CHLORIDE 0.9 % IV BOLUS: 500.0000 mL | Freq: Once | INTRAVENOUS | Status: DC

## 2021-10-02 NOTE — ED Provider Notes (Signed)
[facility] Provider Note   CSN: [id number] Arrival date & time: 10/02/21  1711     History  No chief complaint on file.   Roger Lowe. is a 76 y.o. male.  HPI Patient states he is here because of weight loss, and difficulty swallowing.  This occurs with both food and liquids but mostly food.  He has difficulty describing the exact problem with swallowing.  He chews and swallows but then cannot eat anymore.  He does not describe frank vomiting, choking or aspiration symptoms.  He states that he went to his PCP today who directed him here for further evaluation.  He was evaluated in the ED after fall, about 2 weeks ago.  That time he had imaging that was reassuring, metabolic evaluation was discharged.  He has history of lung cancer.  He is followed closely by infectious disease, for Mycobacterium disease.  Most recently evaluated by them on 09/26/2021.  At that time no acute interventions or medication changes were undertaken.    Home Medications Prior to Admission medications   Medication Sig Start Date End Date Taking? Authorizing Provider  albuterol (VENTOLIN HFA) 108 (90 Base) MCG/ACT inhaler Inhale 1-2 puffs into the lungs every 6 (six) hours as needed for shortness of breath or wheezing. 03/19/20   [provider]  allopurinol (ZYLOPRIM) 300 MG tablet Take 300 mg by mouth daily.     [provider]  amitriptyline (ELAVIL) 50 MG tablet Take 50 mg by mouth 2 (two) times daily.     [provider]  atorvastatin (LIPITOR) 40 MG tablet Take 40 mg by mouth at bedtime. 09/12/20   [provider]  azithromycin (ZITHROMAX) 500 MG tablet Take 1 tablet (500 mg total) by mouth 3 (three) times a week. Only on Mondays, Wednesdays and Fridays only. 09/25/21   Icard, Octavio Graves, DO  BROVANA 15 MCG/2ML NEBU USE 1 VIAL  IN  NEBULIZER TWICE  DAILY - Morning And Evening Patient taking differently: Take 15 mcg by nebulization in the morning and at  bedtime. 12/17/20   Icard, Bradley L, DO  budesonide (PULMICORT) 0.5 MG/2ML nebulizer solution USE 1 VIAL  IN  NEBULIZER TWICE  DAILY - Rinse Mouth After Treatment Patient taking differently: Take 0.5 mg by nebulization in the morning and at bedtime. 12/17/20   Icard, Octavio Graves, DO  carvedilol (COREG) 25 MG tablet TAKE 1 TABLET TWICE DAILY Patient taking differently: Take 25 mg by mouth in the morning and at bedtime. 03/21/21   Arnoldo Lenis, MD  ergocalciferol (VITAMIN D2) 1.25 MG (50000 UT) capsule Take 50,000 Units by mouth every Tuesday.    [provider]  FARXIGA 5 MG TABS tablet Take 5 mg by mouth daily. 09/12/20   [provider]  furosemide (LASIX) 20 MG tablet Take 40 mg by mouth daily.    [provider]  gabapentin (NEURONTIN) 100 MG capsule Take 100 mg by mouth 3 (three) times daily.  10/28/19   [provider]  hydrALAZINE (APRESOLINE) 50 MG tablet Take 1 tablet (50 mg total) by mouth 3 (three) times daily. 04/19/21   Verta Ellen., NP  Insulin Detemir (LEVEMIR FLEXTOUCH) 100 UNIT/ML Pen Inject 45 Units into the skin at bedtime. 11/03/18   Strader, Fransisco Hertz, PA-C  ipratropium-albuterol (DUONEB) 0.5-2.5 (3) MG/3ML SOLN USE 1 VIAL IN NEBULIZER EVERY 6 HOURS - And As Needed (For Rescue -MAX 30 DOSES PER MONTH) 12/17/20   Icard, Octavio Graves, DO  isosorbide mononitrate (IMDUR) 30 MG 24 hr tablet isosorbide mononitrate ER 30 mg tablet,extended release 24 hr  TAKE 1 TABLET BY MOUTH ONCE DAILY    [provider]  methocarbamol (ROBAXIN) 750 MG tablet Take 750 mg by mouth 2 (two) times daily as needed for muscle spasms. 03/27/21   [provider]  nitroGLYCERIN (NITROSTAT) 0.4 MG SL tablet PLACE 1 TABLET UNDER THE TONGUE EVERY 5 (FIVE) MINUTES AS NEEDED FOR CHEST PAIN  AS DIRECTED 08/06/20   Strader, Fransisco Hertz, PA-C  NOVOLOG FLEXPEN 100 UNIT/ML FlexPen Inject 25-40 Units into the skin 3 (three) times daily with meals. Sliding scale 02/21/20    [provider]  OVER THE COUNTER MEDICATION Compression vest     [provider]  oxyCODONE-acetaminophen (PERCOCET) 10-325 MG tablet Take 1 tablet by mouth every 6 (six) hours as needed for pain.    [provider]  OXYGEN Inhale 2-4 L into the lungs continuous.    [provider]  pantoprazole (PROTONIX) 40 MG tablet Take 1 tablet (40 mg total) by mouth 2 (two) times daily. 05/09/21 05/09/22  Manuella Ghazi, Pratik D, DO  RELION PEN NEEDLES 31G X 6 MM MISC USE 1 PEN NEEDLE 4 TIMES DAILY 08/12/19   [provider]  rivaroxaban (XARELTO) 20 MG TABS tablet Take 1 tablet (20 mg total) by mouth every morning. 02/03/20   Arnoldo Lenis, MD  sodium chloride (OCEAN) 0.65 % SOLN nasal spray Place 1 spray into both nostrils as needed for congestion.    [provider]  spironolactone (ALDACTONE) 25 MG tablet TAKE 1 TABLET (25 MG TOTAL) BY MOUTH DAILY. 12/21/20 12/16/21  Arnoldo Lenis, MD  Tiotropium Bromide Monohydrate (SPIRIVA RESPIMAT) 2.5 MCG/ACT AERS Inhale 2 puffs into the lungs daily. 12/31/20   Garner Nash, DO      Allergies    Sacubitril-valsartan    Review of Systems   Review of Systems  Physical Exam Updated Vital Signs BP 128/77 (BP Location: Left Arm)    Pulse (!) 110    Temp 97.6 F (36.4 C) (Oral)    Resp 18    Ht 6' (1.829 m)    Wt 95.3 kg    SpO2 96%    BMI 28.48 kg/m  Physical Exam Vitals and nursing note reviewed.  Constitutional:      General: He is not in acute distress.    Appearance: He is well-developed. He is not ill-appearing or diaphoretic.  HENT:     Head: Normocephalic and atraumatic.     Right Ear: External ear normal.     Left Ear: External ear normal.  Eyes:     Conjunctiva/sclera: Conjunctivae normal.     Pupils: Pupils are equal, round, and reactive to light.  Neck:     Trachea: Phonation normal.  Cardiovascular:     Rate and Rhythm: Normal rate.  Pulmonary:     Effort: Pulmonary effort is normal.   Abdominal:     General: There is no distension.     Palpations: Abdomen is soft.     Tenderness: There is no abdominal tenderness. There is no guarding.  Musculoskeletal:        General: Normal range of motion.     Cervical back: Normal range of motion and neck supple.  Skin:    General: Skin is warm and dry.  Neurological:     Mental Status: He is alert and oriented to person, place, and time.     Cranial Nerves:  No cranial nerve deficit.     Sensory: No sensory deficit.     Motor: No abnormal muscle tone.     Coordination: Coordination normal.  Psychiatric:        Mood and Affect: Mood normal.        Behavior: Behavior normal.        Thought Content: Thought content normal.        Judgment: Judgment normal.    ED Results / Procedures / Treatments   Labs (all labs ordered are listed, but only abnormal results are displayed) Labs Reviewed  CBC WITH DIFFERENTIAL/PLATELET - Abnormal; Notable for the following components:      Result Value   Eosinophils Absolute 1.2 (*)    All other components within normal limits  COMPREHENSIVE METABOLIC PANEL - Abnormal; Notable for the following components:   Sodium 133 (*)    Glucose, Bld 126 (*)    Creatinine, Ser 1.42 (*)    Total Protein 9.6 (*)    Albumin 3.4 (*)    GFR, Estimated 52 (*)    All other components within normal limits    EKG None  Radiology No results found.  Procedures Procedures    Medications Ordered in ED Medications - No data to display  ED Course/ Medical Decision Making/ A&P Clinical Course as of 10/02/21 2225  Wed Oct 02, 2021  2223 After the patient imaging, to include CT chest abdomen pelvis for possible exacerbation of known prior lung cancer.  He declined.  He states that his doctor told him to be admitted but understands that we might not have a reason to do that.  I discussed the mild renal insufficiency with him and encouraged him to drink a lot of fluids.  He called his brother, get him and  take him home. [EW]    Clinical Course User Index [EW] Daleen Bo, MD                           Medical Decision Making He is presenting for evaluation of difficulty eating which is nonspecific and reported weight loss.  He has however vigorous and appears comfortable on initial evaluation.  He states his PCP directed him here but there is no documentation of contact with the ED services, and he does not present with paperwork following his office visit today.  Problems Addressed: Malaise: undiagnosed new problem with uncertain prognosis    Details: The patient is presenting with nonspecific symptoms requiring ED evaluation.  Amount and/or Complexity of Data Reviewed Labs: ordered.    Details: CBC, metabolic panel-normal except sodium low, glucose high, creatinine high, total protein low, albumin low, GFR low  Risk Decision regarding hospitalization. Risk Details: The patient is presenting with nonspecific symptoms and has a reassuring evaluation.  He was offered evaluation for possible recurrence of lung cancer.  He declined CT imaging.  The patient is lucid with good decision-making capacity.  There is no indication for hospitalization at this time.  He is referred back to his PCP and may need referral to ENT for evaluation of the possible swallowing disorder.           Final Clinical Impression(s) / ED Diagnoses Final diagnoses:  None    Rx / DC Orders ED Discharge Orders     None         Daleen Bo, MD 10/03/21 1224

## 2021-10-02 NOTE — ED Notes (Signed)
Patient declined to receive fluids.

## 2021-10-02 NOTE — Discharge Instructions (Signed)
The testing today shows only that your kidney function is slightly worse than previously.  To improve this, try to drink extra fluids, water or nutritional supplements such as Ensure.  Make sure you are taking your medicines as directed.  Try to eat 3 meals a day.  Consider asking your primary care doctor to refer you to a doctor who can evaluate your swallowing discomfort.  This is a ENT doctor.  Return here if your condition worsens or you have other concerns that require evaluation.

## 2021-10-02 NOTE — ED Notes (Signed)
Patient states he was sent by his PCP to be admitted. Patient states he hasn't been able to taste or eat anything in a while. Patient doe not complain of any chest or abdominal pain at this time.

## 2021-10-02 NOTE — ED Notes (Signed)
Patient stated his primary care called around 3pm for him to be admitted. Patient declined IV start and fluids stating his veins were so bad and he can hydrate at home.

## 2021-10-02 NOTE — ED Triage Notes (Signed)
Pt sent by PCP for weight loss of 40lbs in one month. Pt has been unable to eat. Pt states when he tries to eat, he starts coughing and throws up.

## 2021-10-07 ENCOUNTER — Ambulatory Visit (INDEPENDENT_AMBULATORY_CARE_PROVIDER_SITE_OTHER): Payer: Medicare HMO

## 2021-10-07 DIAGNOSIS — Z95810 Presence of automatic (implantable) cardiac defibrillator: Secondary | ICD-10-CM

## 2021-10-07 DIAGNOSIS — I5022 Chronic systolic (congestive) heart failure: Secondary | ICD-10-CM | POA: Diagnosis not present

## 2021-10-07 NOTE — Telephone Encounter (Signed)
Mychart message sent by pt: Roger Lowe.  P [facility] (supporting Mabe, Beatris Ship, CMA) 2 days ago   Dr. Nile Riggs I want to thank you from the bottom of my heart for changing back to original dose it has just these few days have made a big difference in my life I am slowly progressing to a better state I am progressing in a positive way feeling much better thank you again you have no idea how appreciative I am looking forward to my next appointment       Routing to Dr. Valeta Harms as an Juluis Rainier.

## 2021-10-09 DIAGNOSIS — E1165 Type 2 diabetes mellitus with hyperglycemia: Secondary | ICD-10-CM | POA: Diagnosis not present

## 2021-10-09 DIAGNOSIS — J449 Chronic obstructive pulmonary disease, unspecified: Secondary | ICD-10-CM | POA: Diagnosis not present

## 2021-10-09 DIAGNOSIS — R627 Adult failure to thrive: Secondary | ICD-10-CM | POA: Diagnosis not present

## 2021-10-09 NOTE — Progress Notes (Signed)
EPIC Encounter for ICM Monitoring  Patient Name: Roger Lowe. is a 76 y.o. male Date: 10/09/2021 Primary Care Physican: [name], MD Primary Cardiologist: Harl Bowie Electrophysiologist: Lovena Le BiV Pacing: >99% 10/09/2021 Weight: 198 lbs           Spoke with patient and heart failure questions reviewed.  Pt asymptomatic for fluid accumulation.  Reports feeling well at this time and voices no complaints.  He went ER for wt loss, unable to eat and drink but is feeling better.    CorVue thoracic impedance suggesting normal fluid levels.   Prescribed: Furosemide 40 mg take 1 tablet daily. Spironolactone 25 mg take 1 tablet daily Farxiga 5 mg take 1 tablet daily   Labs: 10/02/2021 Creatinine 1.42, BUN 19, Potassium 5.1, Sodium 133, GFR 52 09/19/2021 Creatinine 1.27, BUN 14, Potassium 4.2, Sodium 137, GFR 60  07/09/2021 Creatinine 1.22, BUN 7,   Potassium 4.5, Sodium 135, GFR >60 A complete set of results can be found in Results Review.   Recommendations:  No changes and encouraged to call if experiencing any fluid symptoms.   Follow-up plan: ICM clinic phone appointment on 11/11/2021.   91 day device clinic remote transmission 11/06/2021.     EP/Cardiology Office Visits:  10/24/2021 with Dr Harl Bowie.   Recall 02/15/2021 with Dr. Lovena Le.     Copy of ICM check sent to Dr. Lovena Le.     3 month ICM trend: 10/07/2021.    12-14 Month ICM trend:     [name], RN 10/09/2021 3:26 PM

## 2021-10-12 DIAGNOSIS — E1065 Type 1 diabetes mellitus with hyperglycemia: Secondary | ICD-10-CM | POA: Diagnosis not present

## 2021-10-14 ENCOUNTER — Ambulatory Visit (HOSPITAL_COMMUNITY)
Admission: RE | Admit: 2021-10-14 | Discharge: 2021-10-14 | Disposition: A | Discharge status: Home / Self Care | Payer: Medicare HMO | Source: Ambulatory Visit | Attending: Pulmonary Disease | Admitting: Pulmonary Disease

## 2021-10-14 ENCOUNTER — Other Ambulatory Visit: Payer: Self-pay

## 2021-10-14 DIAGNOSIS — I5032 Chronic diastolic (congestive) heart failure: Secondary | ICD-10-CM | POA: Diagnosis not present

## 2021-10-14 DIAGNOSIS — J449 Chronic obstructive pulmonary disease, unspecified: Secondary | ICD-10-CM | POA: Insufficient documentation

## 2021-10-14 DIAGNOSIS — I5022 Chronic systolic (congestive) heart failure: Secondary | ICD-10-CM | POA: Diagnosis not present

## 2021-10-14 DIAGNOSIS — R911 Solitary pulmonary nodule: Secondary | ICD-10-CM | POA: Insufficient documentation

## 2021-10-14 DIAGNOSIS — J9811 Atelectasis: Secondary | ICD-10-CM | POA: Diagnosis not present

## 2021-10-14 DIAGNOSIS — J479 Bronchiectasis, uncomplicated: Secondary | ICD-10-CM | POA: Diagnosis not present

## 2021-10-14 DIAGNOSIS — J439 Emphysema, unspecified: Secondary | ICD-10-CM | POA: Diagnosis not present

## 2021-10-14 DIAGNOSIS — I700 Atherosclerosis of aorta: Secondary | ICD-10-CM | POA: Diagnosis not present

## 2021-10-14 DIAGNOSIS — J9601 Acute respiratory failure with hypoxia: Secondary | ICD-10-CM | POA: Diagnosis not present

## 2021-10-14 IMAGING — CT CT CHEST SUPER D W/O CM
3 of 6 series · 13 of 36 positions shown, 15 images · non-contrast
Comparison: [DATE]

CLINICAL DATA: Follow-up severe COPD and pulmonary nodule.

EXAM:
CT CHEST WITHOUT CONTRAST
TECHNIQUE: Multidetector CT imaging of the chest was performed using thin slice
collimation for electromagnetic bronchoscopy planning purposes,
without intravenous contrast.
RADIATION DOSE REDUCTION: This exam was performed according to the
departmental dose-optimization program which includes automated
exposure control, adjustment of the mA and/or kV according to
patient size and/or use of iterative reconstruction technique.

[Series 2: thorax · axial · 0.63mm/px · z∈[+90,+198]mm · 2 of 162 slices shown]
[im 54/162  lung]
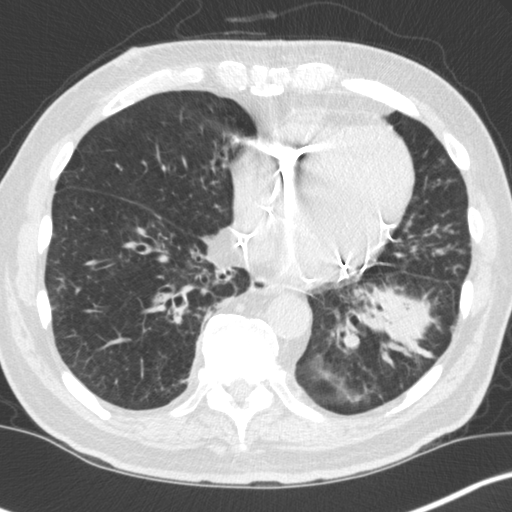
[im 108/162  lung]
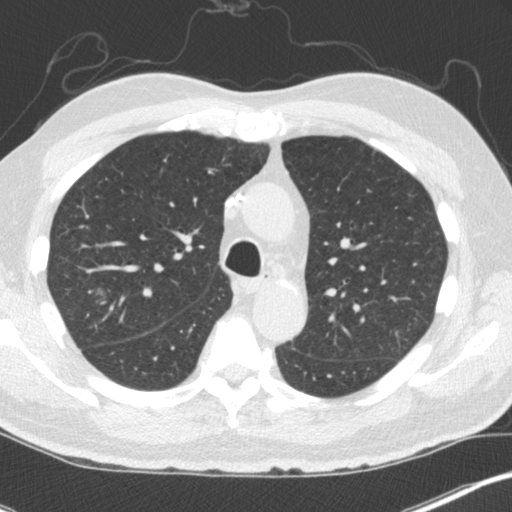

[Series 3: super d-thins · axial · 0.63mm/px · z∈[+18,+270]mm · 8 of 405 slices shown, 10 images]
[im 45/405  mediastinal]
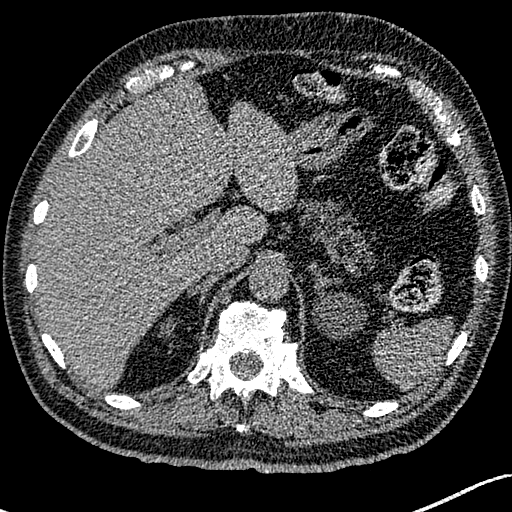
[im 45/405  lung]
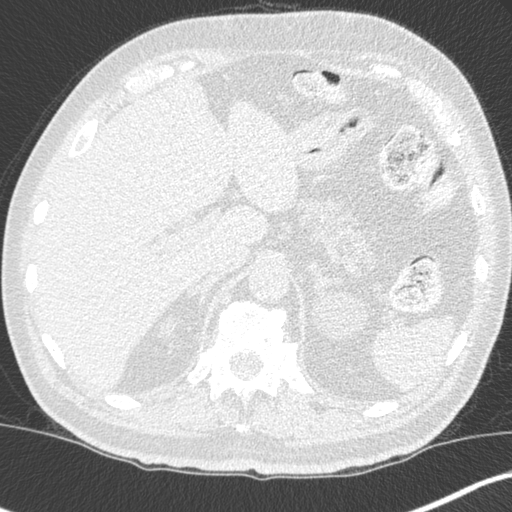
[im 90/405  lung]
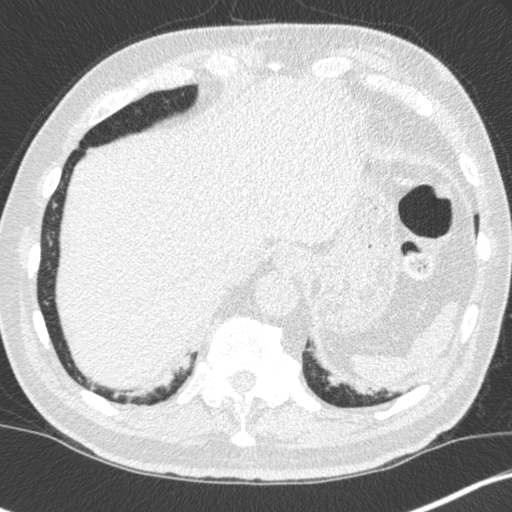
[im 135/405  lung]
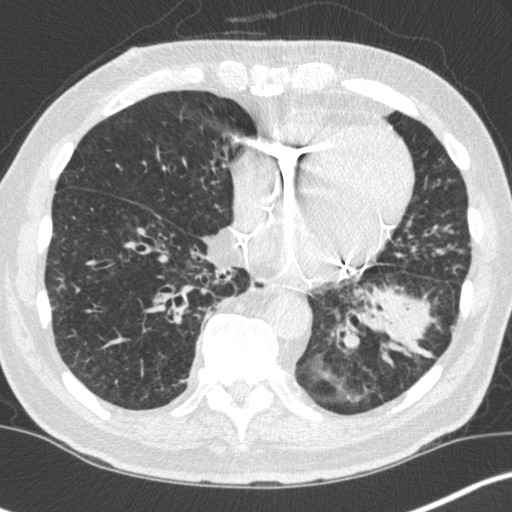
[im 180/405  lung]
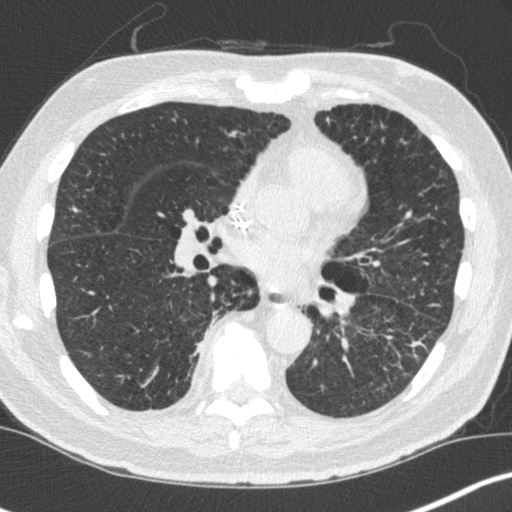
[im 225/405  mediastinal]
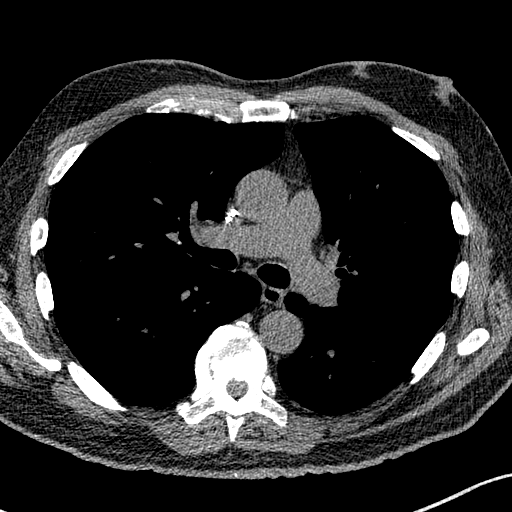
[im 225/405  lung]
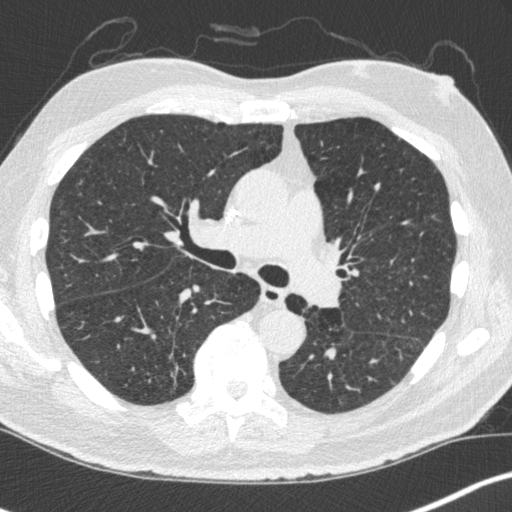
[im 270/405  lung]
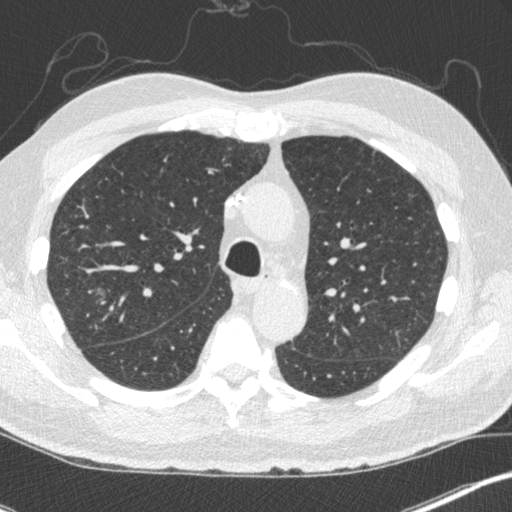
[im 315/405  lung]
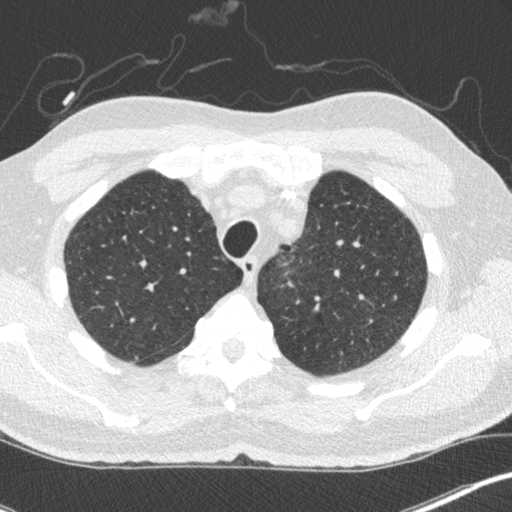
[im 360/405  lung]
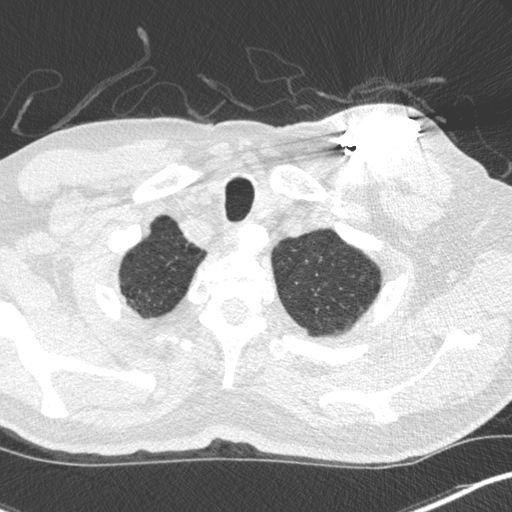

[Series 5: coronal · coronal · 0.63mm/px · 3 of 150 slices shown]
[im 30/150  lung]
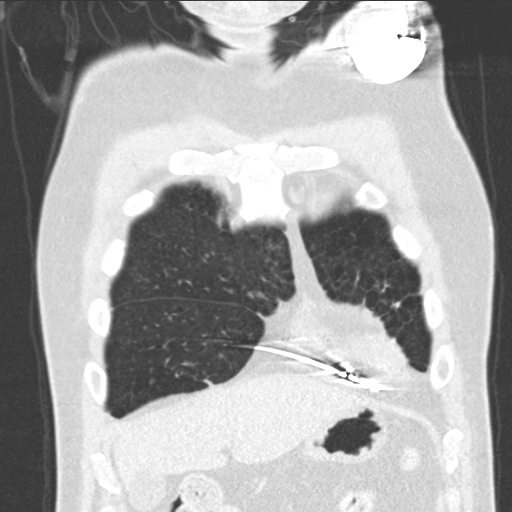
[im 60/150  lung]
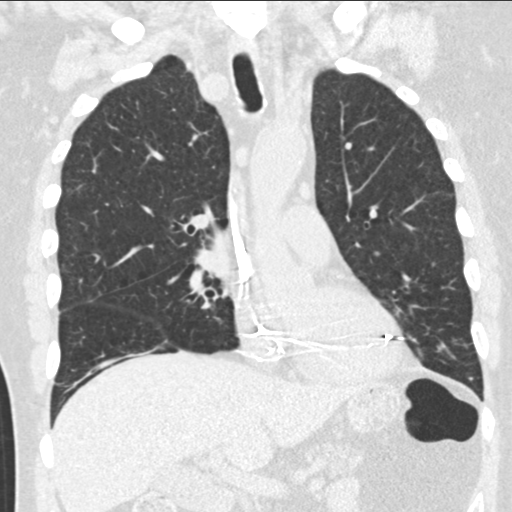
[im 90/150  lung]
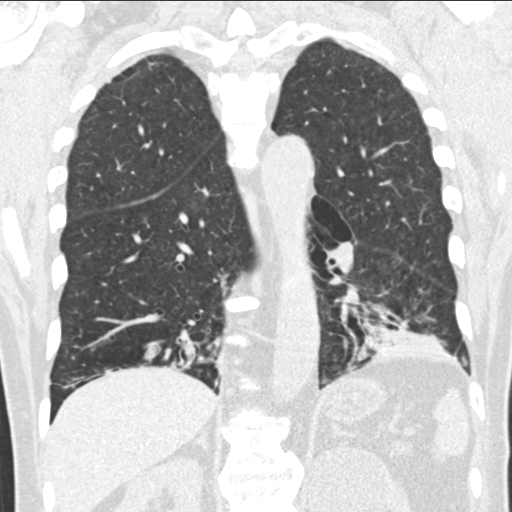

[13 of 36 positions shown; findings below may reference images not displayed]

FINDINGS: Cardiovascular: Normal heart size. No pericardial effusion. Aortic
atherosclerosis. There is a left chest wall ICD with leads in the
right atrial appendage, coronary sinus and right ventricle.

Mediastinum/Nodes: Normal appearance of the thyroid gland. The
trachea appears patent and is midline. Normal appearance of the
esophagus. No enlarged axillary or supraclavicular lymph nodes.
Prominent low left paratracheal lymph node measures 1.1 cm and is
unchanged from the previous exam, image 63/2.

Lungs/Pleura: Mild to moderate centrilobular and paraseptal
emphysema. Diffuse bronchial wall thickening.

Bilateral lower lung zone predominant cylindrical bronchiectasis is
identified with surrounding areas of ground-glass attenuation and
thickening of the peribronchovascular interstitium. Scattered areas
of mucous plugging within dilated airways noted bilaterally. Chronic
atelectasis and volume loss within the posterior left lower lobe is
again noted, image 109/4.

Again seen is a spiculated nodule within the superior segment of the
left lower lobe abutting the oblique fissure. This measures 1.3 x
1.5 cm, image 83/4. Not significantly changed compared with
[DATE].

Upper Abdomen: No acute abnormality. Cyst in the upper pole of the
left kidney measures 6.1 cm, image 156/2.

Musculoskeletal: Spondylosis identified within the thoracic spine.
No acute or suspicious osseous findings.
IMPRESSION: 1. No significant change in size of FDG avid nodule within the
superior segment of left lower lobe which abuts the oblique fissure.
2. Bilateral, lower lung zone predominant cylindrical bronchiectasis
with surrounding inflammatory changes. Imaging findings are
compatible with chronic inflammation/infection and/or recurrent
aspiration.
3. Diffuse bronchial wall thickening with emphysema, as above;
imaging findings suggestive of underlying COPD.
4. Aortic Atherosclerosis ([HX]-[HX]) and Emphysema ([HX]-[HX]).

## 2021-10-14 MED ORDER — ISOSORBIDE MONONITRATE ER 30 MG PO TB24: ORAL_TABLET | ORAL | 3 refills | Status: DC

## 2021-10-14 NOTE — Telephone Encounter (Signed)
Imdur 30 mg 1 po qd #30, RF:3 to [facility], has f/u with Dr.Branch scheduled.

## 2021-10-15 DIAGNOSIS — Z0100 Encounter for examination of eyes and vision without abnormal findings: Secondary | ICD-10-CM | POA: Diagnosis not present

## 2021-10-15 DIAGNOSIS — H5200 Hypermetropia, unspecified eye: Secondary | ICD-10-CM | POA: Diagnosis not present

## 2021-10-17 ENCOUNTER — Ambulatory Visit: Payer: Medicare HMO | Admitting: Acute Care

## 2021-10-18 ENCOUNTER — Ambulatory Visit (INDEPENDENT_AMBULATORY_CARE_PROVIDER_SITE_OTHER): Payer: Medicare HMO | Admitting: Acute Care

## 2021-10-18 ENCOUNTER — Other Ambulatory Visit: Payer: Self-pay

## 2021-10-18 ENCOUNTER — Encounter: Payer: Self-pay | Admitting: Acute Care

## 2021-10-18 ENCOUNTER — Telehealth: Payer: Self-pay | Admitting: Acute Care

## 2021-10-18 VITALS — BP 130/87 | HR 102 | Temp 98.1°F | Ht 72.0 in | Wt 208.4 lb

## 2021-10-18 DIAGNOSIS — J470 Bronchiectasis with acute lower respiratory infection: Secondary | ICD-10-CM

## 2021-10-18 DIAGNOSIS — J449 Chronic obstructive pulmonary disease, unspecified: Secondary | ICD-10-CM | POA: Diagnosis not present

## 2021-10-18 DIAGNOSIS — Z85118 Personal history of other malignant neoplasm of bronchus and lung: Secondary | ICD-10-CM | POA: Diagnosis not present

## 2021-10-18 DIAGNOSIS — T17928A Food in respiratory tract, part unspecified causing other injury, initial encounter: Secondary | ICD-10-CM

## 2021-10-18 DIAGNOSIS — Z87891 Personal history of nicotine dependence: Secondary | ICD-10-CM | POA: Diagnosis not present

## 2021-10-18 DIAGNOSIS — Z2239 Carrier of other specified bacterial diseases: Secondary | ICD-10-CM | POA: Diagnosis not present

## 2021-10-18 NOTE — Telephone Encounter (Signed)
Please call patient Monday and ask him if he has every had a swallow study. If he has not, please schedule with speech therapy. Also, check to see if patient has a flutter valve. If not please see if we can get one through the [place] office for his family to pick up, or send in a prescription for one to a drug store in [place]. He needs to use 4 times daily 4 blows at a time for chest congestion.  Thanks

## 2021-10-18 NOTE — Patient Instructions (Addendum)
It is good to see you today.  So glad you are feeling better.  Your CT scan shows that the nodule we have been monitoring has been stable since November. This is good news. We will repeat a CT Chest in 3 or 6 months, we will check with Dr. Valeta Harms on timing and let you know.  Continue you Azithromycin as you have been instructed by infectious disease Continue your breathing medications as you have been doing. Brovana plus Pulmicort, Spiriva Respimat 2 puffs once a day Wear oxygen to keep your oxygen saturations > 88% at all times.  Swallow study if he has not already had one Flutter valve 3-4 blows up to 5 times a day.  Follow up with Dr. Valeta Harms or Judson Roch NP after follow up CT Chest.  Please contact office for sooner follow up if symptoms do not improve or worsen or seek emergency care
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
## 2021-10-21 ENCOUNTER — Telehealth: Payer: Self-pay | Admitting: Acute Care

## 2021-10-21 DIAGNOSIS — R911 Solitary pulmonary nodule: Secondary | ICD-10-CM

## 2021-10-21 NOTE — Telephone Encounter (Signed)
I received a call from the speech therapy and they had to have a diagnosis code of GERD before they would pay for his speech therapy.y I tried all the codes we have and all they would pay for was GERD. This is the only code on his list that would be covered for insurance purposes.

## 2021-10-21 NOTE — Telephone Encounter (Signed)
I called the patient and asked him to call back with results.

## 2021-10-21 NOTE — Telephone Encounter (Signed)
Spoke with pt who states he has not had a swallow study done and placed order for Speech Therapy. Pt stated that he doesn't currently have a flutter valve. Pt will pick up flutter valve in [place] office. Flutter valve instructions were given to pt on this call. Pt stated understanding. ST order placed as well. Nothing further needed at this time.

## 2021-10-21 NOTE — Telephone Encounter (Signed)
Please call patient and let him know I spoke with Dr. Valeta Harms, and we will do a 6 month follow up CT to continue to monitor the nodule of concern, just to make sure it remains stable. I have  ordered the  6 month follow up CT Chest without contrast. .  Thanks so much

## 2021-10-21 NOTE — Telephone Encounter (Signed)
I called Crystal to call back about an additional diagnosis code. I left the office number to call back to give her the additional diagnosis code.

## 2021-10-21 NOTE — Addendum Note (Signed)
Addended by: Dierdre Highman on: 10/21/2021 09:25 AM   Modules accepted: Orders

## 2021-10-22 ENCOUNTER — Other Ambulatory Visit (HOSPITAL_COMMUNITY): Payer: Self-pay | Admitting: Specialist

## 2021-10-22 DIAGNOSIS — T17928S Food in respiratory tract, part unspecified causing other injury, sequela: Secondary | ICD-10-CM

## 2021-10-22 DIAGNOSIS — K219 Gastro-esophageal reflux disease without esophagitis: Secondary | ICD-10-CM

## 2021-10-22 NOTE — Telephone Encounter (Signed)
Noted  

## 2021-10-24 ENCOUNTER — Encounter: Payer: Self-pay | Admitting: Cardiology

## 2021-10-24 ENCOUNTER — Ambulatory Visit (INDEPENDENT_AMBULATORY_CARE_PROVIDER_SITE_OTHER): Payer: Medicare HMO | Admitting: Cardiology

## 2021-10-24 ENCOUNTER — Other Ambulatory Visit: Payer: Self-pay | Admitting: *Deleted

## 2021-10-24 VITALS — BP 106/70 | HR 104 | Ht 72.0 in | Wt 210.2 lb

## 2021-10-24 DIAGNOSIS — I2510 Atherosclerotic heart disease of native coronary artery without angina pectoris: Secondary | ICD-10-CM

## 2021-10-24 DIAGNOSIS — I5022 Chronic systolic (congestive) heart failure: Secondary | ICD-10-CM | POA: Diagnosis not present

## 2021-10-24 DIAGNOSIS — I10 Essential (primary) hypertension: Secondary | ICD-10-CM

## 2021-10-24 MED ORDER — SPIRIVA RESPIMAT 2.5 MCG/ACT IN AERS: 2.0000 | INHALATION_SPRAY | Freq: Every day | RESPIRATORY_TRACT | 3 refills | Status: DC

## 2021-10-24 NOTE — Patient Instructions (Signed)
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
## 2021-10-25 NOTE — Telephone Encounter (Signed)
Patient has an OV and order has been placed for the future CT.

## 2021-10-28 ENCOUNTER — Other Ambulatory Visit: Payer: Self-pay | Admitting: Primary Care

## 2021-10-28 ENCOUNTER — Ambulatory Visit (HOSPITAL_COMMUNITY): Payer: Medicare HMO | Admitting: Speech Pathology

## 2021-10-29 ENCOUNTER — Encounter (HOSPITAL_COMMUNITY): Payer: Self-pay | Admitting: Speech Pathology

## 2021-10-29 ENCOUNTER — Ambulatory Visit (HOSPITAL_COMMUNITY)
Admission: RE | Admit: 2021-10-29 | Discharge: 2021-10-29 | Disposition: A | Discharge status: Home / Self Care | Payer: Medicare HMO | Source: Ambulatory Visit | Attending: Acute Care | Admitting: Acute Care

## 2021-10-29 ENCOUNTER — Other Ambulatory Visit: Payer: Self-pay

## 2021-10-29 ENCOUNTER — Ambulatory Visit (HOSPITAL_COMMUNITY): Payer: Medicare HMO | Attending: Acute Care | Admitting: Speech Pathology

## 2021-10-29 DIAGNOSIS — R1312 Dysphagia, oropharyngeal phase: Secondary | ICD-10-CM | POA: Diagnosis not present

## 2021-10-29 DIAGNOSIS — K219 Gastro-esophageal reflux disease without esophagitis: Secondary | ICD-10-CM | POA: Diagnosis not present

## 2021-10-29 DIAGNOSIS — T17928S Food in respiratory tract, part unspecified causing other injury, sequela: Secondary | ICD-10-CM | POA: Insufficient documentation

## 2021-10-29 DIAGNOSIS — R1310 Dysphagia, unspecified: Secondary | ICD-10-CM | POA: Diagnosis not present

## 2021-10-29 IMAGING — RF DG SWALLOWING FUNCTION
1 series · 1 of 1 positions shown · non-contrast
Comparison: None

CLINICAL DATA: Dysphagia. Recurrent pneumonia

EXAM:
MODIFIED BARIUM SWALLOW
TECHNIQUE: Different consistencies of barium were administered orally to the
patient by the Speech Pathologist. Imaging of the pharynx was
performed in the lateral projection. The radiologist was present in
the fluoroscopy room for this study, providing personal supervision.
FLUOROSCOPY:
Fluoroscopy Time:  3 minutes 18 seconds
Radiation Exposure Index (if provided by the fluoroscopic device):
61.7 mGy
Number of Acquired Spot Images: multiple fluoroscopic screen
captures

[Series 1: cp_standard · 0.25mm/px · 1 of 1 slices shown]
[im 1/1]
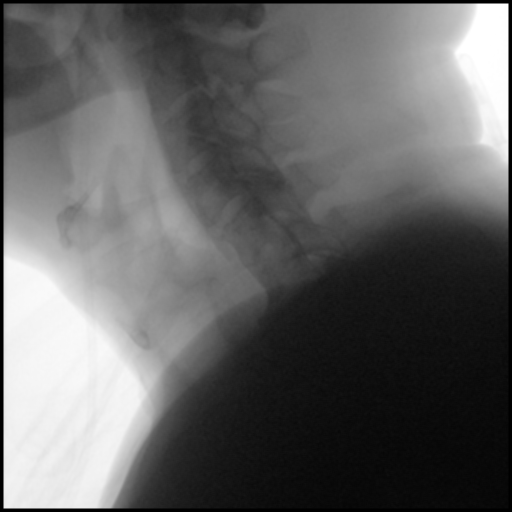

[1 of 1 positions shown; findings below may reference images not displayed]

FINDINGS: Thin barium, nectar consistency, pure a, and mixed consistency were
evaluated.

No swallowing dysfunction identified.

No laryngeal penetration, aspiration or significant residuals.

12.5 mm diameter barium tablet passed from oral cavity to stomach
without obstruction.
IMPRESSION: Normal exam.

Please refer to the Speech Pathologists report for complete details
and recommendations.

## 2021-10-29 IMAGING — RF DG SWALLOWING FUNCTION
12 of 14 series · 13 of 24 positions shown · non-contrast
Comparison: None

CLINICAL DATA: Dysphagia. Recurrent pneumonia

EXAM:
MODIFIED BARIUM SWALLOW
TECHNIQUE: Different consistencies of barium were administered orally to the
patient by the Speech Pathologist. Imaging of the pharynx was
performed in the lateral projection. The radiologist was present in
the fluoroscopy room for this study, providing personal supervision.
FLUOROSCOPY:
Fluoroscopy Time:  3 minutes 18 seconds
Radiation Exposure Index (if provided by the fluoroscopic device):
61.7 mGy
Number of Acquired Spot Images: multiple fluoroscopic screen
captures

[Series 1: before po · 1 of 32 frames shown]
[frame 5/32]
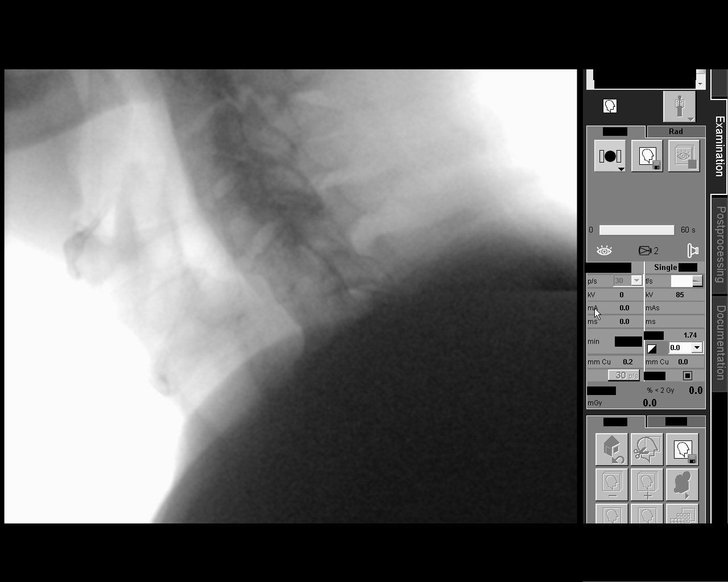

[Series 2: tsp thin 1 · 1 of 356 frames shown]
[frame 179/356]
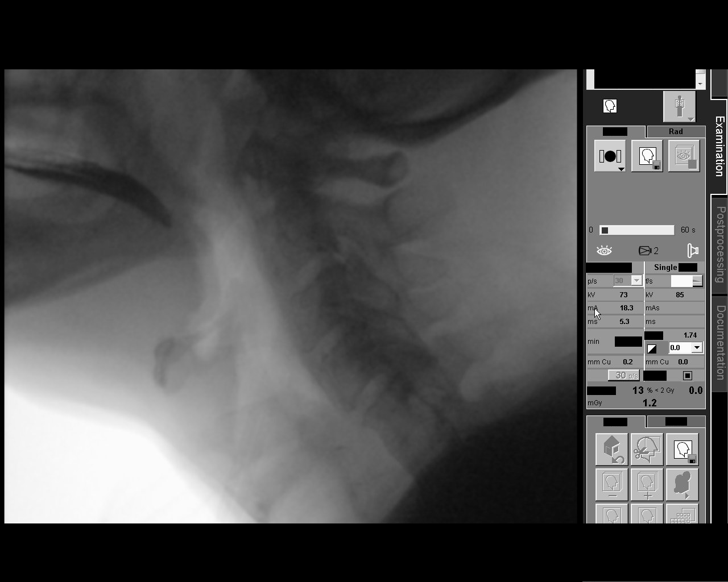

[Series 3: tsp thin 2 · 1 of 271 frames shown]
[frame 136/271]
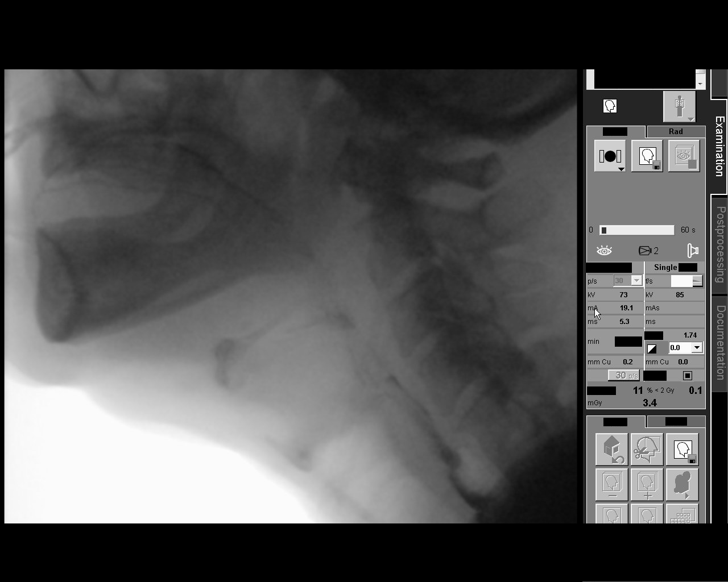

[Series 4: cup thin 1 · 1 of 353 frames shown]
[frame 301/353]
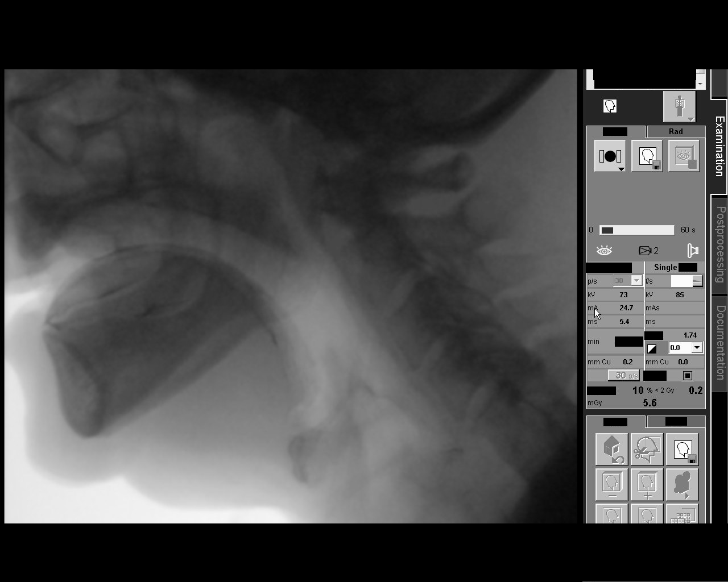

[Series 5: cup thin 2 · 1 of 286 frames shown]
[frame 244/286]
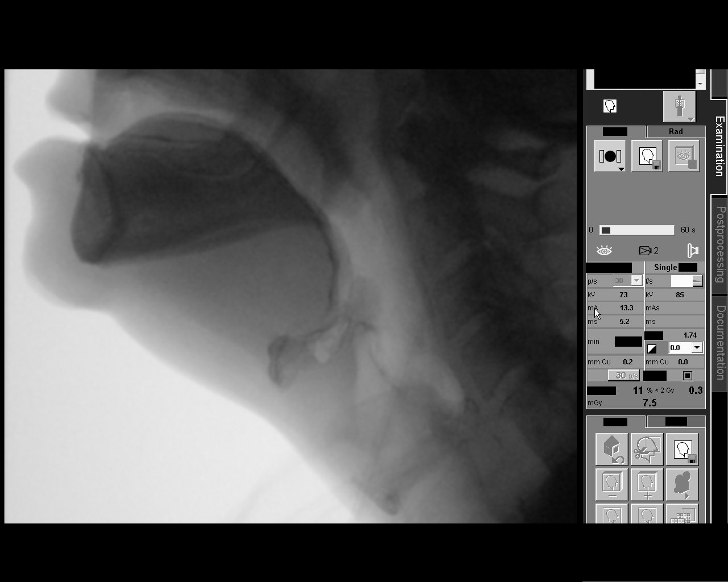

[Series 7: puree 2 · 1 of 654 frames shown]
[frame 99/654]
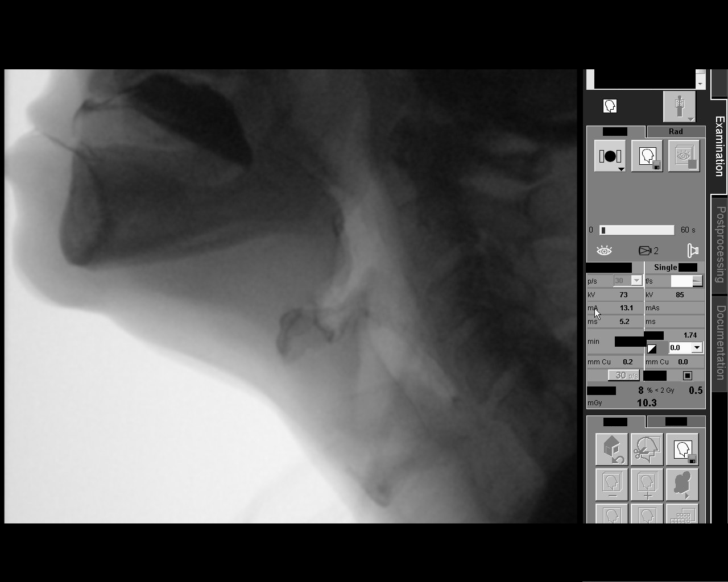

[Series 8: puree 3 · 2 of 528 frames shown]
[frame 265/528]
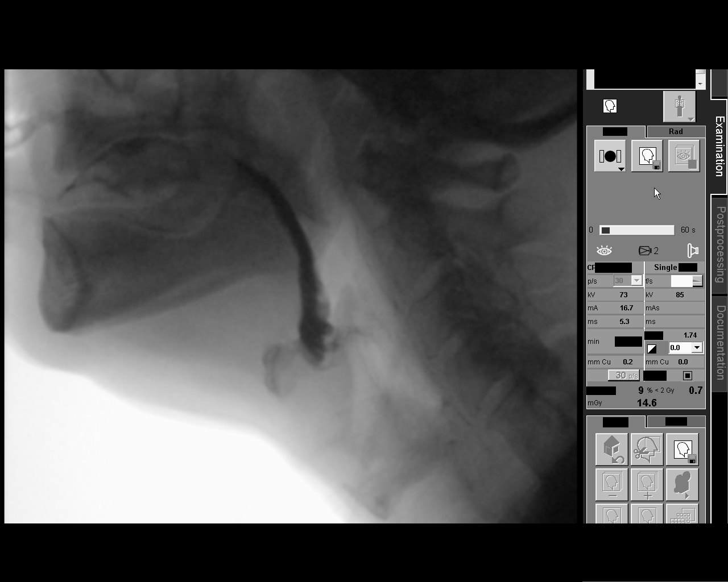
[frame 449/528]
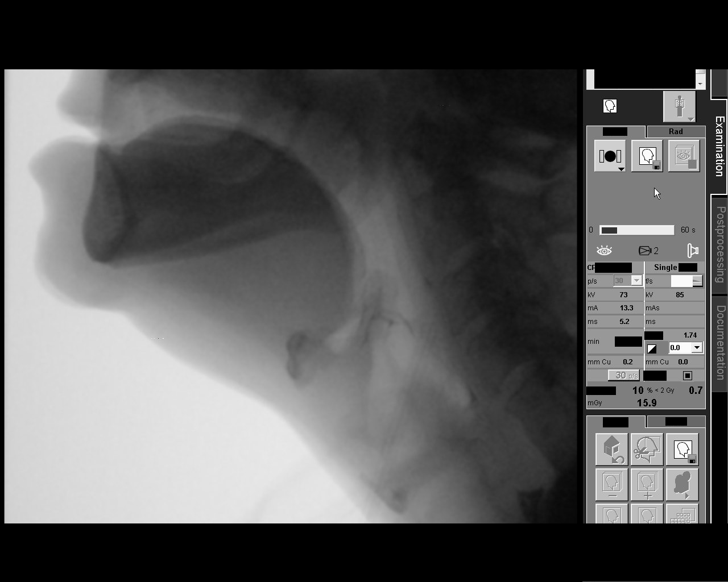

[Series 10: pill with cup thin · 1 of 382 frames shown]
[frame 58/382]
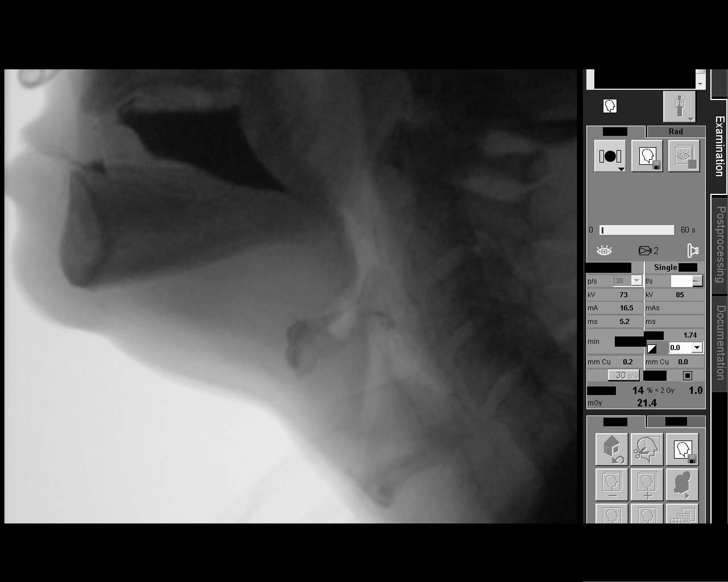

[Series 11: sweep · 1 of 186 frames shown (1 of 2)]
[frame 94/186]
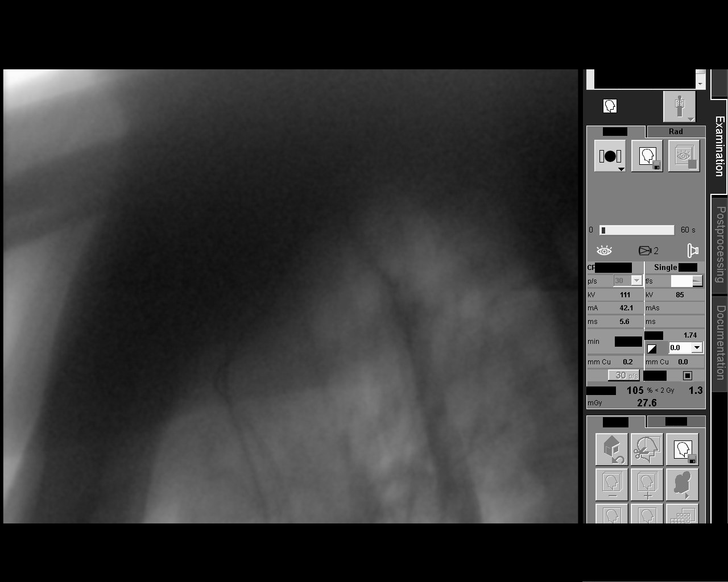

[Series 12: sweep · 1 of 163 frames shown (2 of 2)]
[frame 50/163]
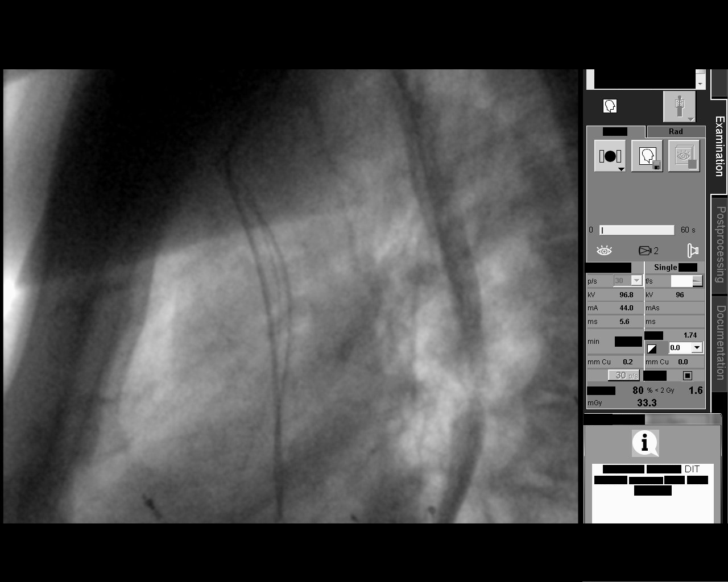

[Series 14: straw thin · 1 of 677 frames shown]
[frame 339/677]
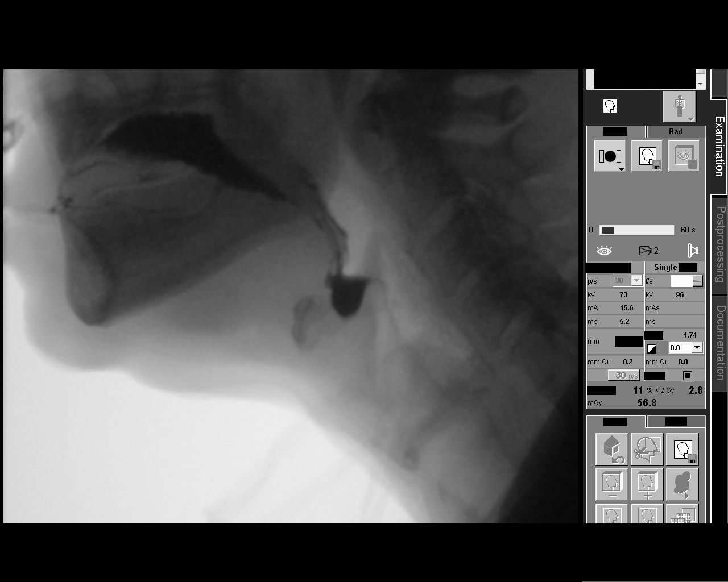

[Series 15: cup ntl · 1 of 589 frames shown]
[frame 562/589]
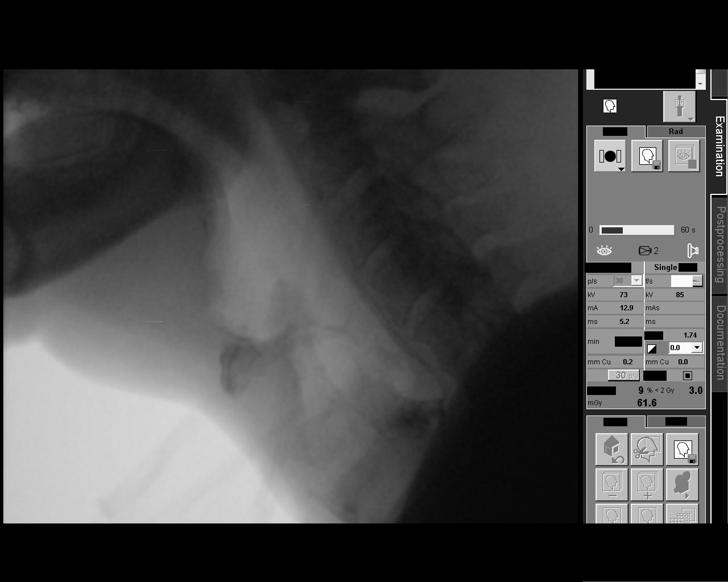

[13 of 24 positions shown; findings below may reference images not displayed]

FINDINGS: Thin barium, nectar consistency, pure a, and mixed consistency were
evaluated.

No swallowing dysfunction identified.

No laryngeal penetration, aspiration or significant residuals.

12.5 mm diameter barium tablet passed from oral cavity to stomach
without obstruction.
IMPRESSION: Normal exam.

Please refer to the Speech Pathologists report for complete details
and recommendations.
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
## 2021-10-30 DIAGNOSIS — J449 Chronic obstructive pulmonary disease, unspecified: Secondary | ICD-10-CM | POA: Diagnosis not present

## 2021-11-07 ENCOUNTER — Inpatient Hospital Stay (HOSPITAL_COMMUNITY): Payer: Medicare HMO | Attending: Hematology | Admitting: Hematology

## 2021-11-07 ENCOUNTER — Other Ambulatory Visit: Payer: Self-pay

## 2021-11-07 VITALS — BP 133/85 | HR 93 | Temp 96.2°F | Resp 20 | Ht 69.76 in | Wt 207.8 lb

## 2021-11-07 DIAGNOSIS — Z87891 Personal history of nicotine dependence: Secondary | ICD-10-CM | POA: Diagnosis not present

## 2021-11-07 DIAGNOSIS — Z79899 Other long term (current) drug therapy: Secondary | ICD-10-CM | POA: Insufficient documentation

## 2021-11-07 DIAGNOSIS — C3490 Malignant neoplasm of unspecified part of unspecified bronchus or lung: Secondary | ICD-10-CM

## 2021-11-07 DIAGNOSIS — Z85118 Personal history of other malignant neoplasm of bronchus and lung: Secondary | ICD-10-CM | POA: Insufficient documentation

## 2021-11-07 DIAGNOSIS — Z794 Long term (current) use of insulin: Secondary | ICD-10-CM | POA: Diagnosis not present

## 2021-11-07 DIAGNOSIS — Z7951 Long term (current) use of inhaled steroids: Secondary | ICD-10-CM | POA: Diagnosis not present

## 2021-11-07 DIAGNOSIS — Z7984 Long term (current) use of oral hypoglycemic drugs: Secondary | ICD-10-CM | POA: Insufficient documentation

## 2021-11-07 DIAGNOSIS — Z923 Personal history of irradiation: Secondary | ICD-10-CM | POA: Insufficient documentation

## 2021-11-07 DIAGNOSIS — Z86718 Personal history of other venous thrombosis and embolism: Secondary | ICD-10-CM | POA: Diagnosis not present

## 2021-11-07 DIAGNOSIS — Z7901 Long term (current) use of anticoagulants: Secondary | ICD-10-CM | POA: Insufficient documentation

## 2021-11-07 DIAGNOSIS — Z9221 Personal history of antineoplastic chemotherapy: Secondary | ICD-10-CM | POA: Insufficient documentation

## 2021-11-07 DIAGNOSIS — Z86711 Personal history of pulmonary embolism: Secondary | ICD-10-CM | POA: Diagnosis not present

## 2021-11-07 NOTE — Progress Notes (Signed)
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 bronchiectasis with surrounding inflammatory changes. Imaging findings are compatible with chronic inflammation/infection and/or recurrent aspiration. 3. Diffuse bronchial wall thickening with emphysema, as above; imaging findings suggestive of underlying COPD. 4. Aortic Atherosclerosis (ICD10-I70.0) and Emphysema (ICD10-J43.9). Electronically Signed   By: Kerby Moors M.D.   On: 10/15/2021 08:49     ASSESSMENT:  1.  Left lung nodule with AP window lymph node: - He reports that he had a history of left lung cancer, treated with chemo and radiation therapy in 1996 in [place]. - CT chest with contrast on 05/06/2021 showed 13 x 15 mm left lower lobe cavitary nodule, increased from previous measurement of 8 x 12 mm and new from September 2020 exam.  Single enlarged AP window lymph node measuring 14 mm in short axis. - We have reviewed PET CT scan images which showed partially cavitary left lower lobe nodule with SUV 4.67.  AP window lymph node with SUV 4.17, size 9 mm.  Small  lymph nodes in the right and left level 5A are nonspecific. - Bronchoscopy and biopsy on 07/09/2021 by Dr. Valeta Harms.  Left lower lobe biopsy was negative.  Lavage was negative.  aFP was negative.  2.  Social/family history: - He is retired and worked in [employer].  He was an ex-smoker, quit 14 years ago, 2 packs/day. - He lives at home with his brother.  He uses electric wheelchair to get around the house secondary to dyspnea on exertion and back and hip pain.  No family history of malignancies.   PLAN:  1.  Cavitary left lower lobe lung nodule and AP window lymph node: - He was evaluated by ID and treatment for Mycobacterium abscessus was not recommended. - He reports a greenish expectoration which is chronic.  He lost about 24 pounds in the last 3 to 4 months.  He reports that he is starting to eat better at this time.  He is also drinking 2 ensures per day.  He was told to increase it to 3 ensures per day. - I have reviewed CT images from 10/14/2021 which did not show any significant change in size of the left lower lobe superior segment nodule measuring 1.5 x 1.3 cm. - He will have a CT scan in mid May.  RTC in 3 months. - I reviewed his labs.  Total protein is slightly elevated.  We will check SPEP at next visit.  2.  History of DVT and pulmonary embolism: - Pulmonary embolism diagnosed around 2017.  On Xarelto since then.  3.  History of left lung cancer: - He was treated with chemo and radiation in 1995 in [place].   Orders placed this encounter:  Orders Placed This Encounter  Procedures   CBC with Differential/Platelet   Comprehensive metabolic panel   Protein electrophoresis, serum     Derek Jack, MD [place] [phone]   I, Thana Ates, am acting as a scribe for Dr. Derek Jack.  I, Derek Jack MD, have reviewed the above documentation for accuracy and completeness, and I agree with the above.

## 2021-11-07 NOTE — Patient Instructions (Addendum)
[place] at [facility] ?Discharge Instructions ? ?You were seen and examined today by Dr. Delton Coombes. He reviewed your most recent labs and scans and everything looks stable except your one of your protein levels is elevated. Increase insure to 3 daily. Please keep follow up appointments as scheduled in 3 months. ? ? ?Thank you for choosing [place] at [facility] to provide your oncology and hematology care.  To afford each patient quality time with our provider, please arrive at least 15 minutes before your scheduled appointment time.  ? ?If you have a lab appointment with the [facility] please come in thru the Main Entrance and check in at the main information desk. ? ?You need to re-schedule your appointment should you arrive 10 or more minutes late.  We strive to give you quality time with our providers, and arriving late affects you and other patients whose appointments are after yours.  Also, if you no show three or more times for appointments you may be dismissed from the clinic at the providers discretion.     ?Again, thank you for choosing [facility].  Our hope is that these requests will decrease the amount of time that you wait before being seen by our physicians.       ?_____________________________________________________________ ? ?Should you have questions after your visit to [facility], please contact our office at [phone] and follow the prompts.  Our office hours are 8:00 a.m. and 4:30 p.m. Monday - Friday.  Please note that voicemails left after 4:00 p.m. may not be returned until the following business day.  We are closed weekends and major holidays.  You do have access to a nurse 24-7, just call the main number to the clinic [phone] and do not press any options, hold on the line and a nurse will answer the phone.   ? ?For prescription refill requests, have your pharmacy contact our office and allow 72  hours.   ? ?Due to Covid, you will need to wear a mask upon entering the hospital. If you do not have a mask, a mask will be given to you at the Main Entrance upon arrival. For doctor visits, patients may have 1 support person age 17 or older with them. For treatment visits, patients can not have anyone with them due to social distancing guidelines and our immunocompromised population.  ? ?  ?

## 2021-11-11 ENCOUNTER — Ambulatory Visit (INDEPENDENT_AMBULATORY_CARE_PROVIDER_SITE_OTHER): Payer: Medicare HMO

## 2021-11-11 DIAGNOSIS — J9601 Acute respiratory failure with hypoxia: Secondary | ICD-10-CM | POA: Diagnosis not present

## 2021-11-11 DIAGNOSIS — I5032 Chronic diastolic (congestive) heart failure: Secondary | ICD-10-CM | POA: Diagnosis not present

## 2021-11-11 DIAGNOSIS — I5022 Chronic systolic (congestive) heart failure: Secondary | ICD-10-CM | POA: Diagnosis not present

## 2021-11-11 DIAGNOSIS — E1065 Type 1 diabetes mellitus with hyperglycemia: Secondary | ICD-10-CM | POA: Diagnosis not present

## 2021-11-11 DIAGNOSIS — Z95810 Presence of automatic (implantable) cardiac defibrillator: Secondary | ICD-10-CM | POA: Diagnosis not present

## 2021-11-12 ENCOUNTER — Encounter: Payer: Self-pay | Admitting: Cardiology

## 2021-11-12 NOTE — Telephone Encounter (Signed)
error 

## 2021-11-12 NOTE — Progress Notes (Signed)
EPIC Encounter for ICM Monitoring ? ?Patient Name: Roger Lowe. is a 76 y.o. male ?Date: 11/12/2021 ?Primary Care Physican: [name], MD ?Primary Cardiologist: Harl Bowie ?Electrophysiologist: Lovena Le ?BiV Pacing: >99% ?10/09/2021 Weight: 198 lbs ?   ?       Spoke with patient and heart failure questions reviewed.  Pt asymptomatic for fluid accumulation.  Reports feeling well at this time and voices no complaints.  He reports eating chinese food over the weekend.  ?  ?CorVue thoracic impedance suggesting possible fluid accumulation starting 3/11 and also from 2/16-2/27. ?  ?Prescribed: ?Furosemide 40 mg take 1 tablet daily. ?Spironolactone 25 mg take 1 tablet daily ?Farxiga 5 mg take 1 tablet daily ?  ?Labs: ?10/02/2021 Creatinine 1.42, BUN 19, Potassium 5.1, Sodium 133, GFR 52 ?09/19/2021 Creatinine 1.27, BUN 14, Potassium 4.2, Sodium 137, GFR 60  ?07/09/2021 Creatinine 1.22, BUN 7,   Potassium 4.5, Sodium 135, GFR >60 ?A complete set of results can be found in Results Review. ?  ?Recommendations:  Encouraged to limit salt and to call if he develops any fluid symptoms.  ?  ?Follow-up plan: ICM clinic phone appointment on 11/20/2021 to recheck fluid levels.   91 day device clinic remote transmission 02/05/2022.   ?  ?EP/Cardiology Office Visits:  05/01/2022 with Dr Harl Bowie.   Recall 02/15/2021 with Dr. Lovena Le.   ?  ?Copy of ICM check sent to Dr. Lovena Le.    ?  ?3 month ICM trend: 11/11/2021. ? ? ? ?12-14 Month ICM trend:  ? ? ? ?[name], RN ?11/12/2021 ?1:26 PM ? ?

## 2021-11-18 ENCOUNTER — Ambulatory Visit (INDEPENDENT_AMBULATORY_CARE_PROVIDER_SITE_OTHER): Payer: Medicare HMO

## 2021-11-18 DIAGNOSIS — I428 Other cardiomyopathies: Secondary | ICD-10-CM

## 2021-11-19 LAB — CUP PACEART REMOTE DEVICE CHECK
Battery Remaining Longevity: 71 mo
Battery Remaining Percentage: 76 %
Battery Voltage: 2.96 V
Brady Statistic AP VP Percent: 1 %
Brady Statistic AP VS Percent: 1 %
Brady Statistic AS VP Percent: 99 %
Brady Statistic AS VS Percent: 1 %
Brady Statistic RA Percent Paced: 1 %
Date Time Interrogation Session: 20230319112406
HighPow Impedance: 72 Ohm
Implantable Lead Implant Date: 20210308
Implantable Lead Implant Date: 20210308
Implantable Lead Implant Date: 20210308
Implantable Lead Location: 753858
Implantable Lead Location: 753859
Implantable Lead Location: 753860
Implantable Lead Model: 7122
Implantable Pulse Generator Implant Date: 20210308
Lead Channel Impedance Value: 410 Ohm
Lead Channel Impedance Value: 530 Ohm
Lead Channel Impedance Value: 890 Ohm
Lead Channel Pacing Threshold Amplitude: 0.625 V
Lead Channel Pacing Threshold Amplitude: 0.75 V
Lead Channel Pacing Threshold Amplitude: 1.25 V
Lead Channel Pacing Threshold Pulse Width: 0.5 ms
Lead Channel Pacing Threshold Pulse Width: 0.5 ms
Lead Channel Pacing Threshold Pulse Width: 0.5 ms
Lead Channel Sensing Intrinsic Amplitude: 11.8 mV
Lead Channel Sensing Intrinsic Amplitude: 4.1 mV
Lead Channel Setting Pacing Amplitude: 1.625
Lead Channel Setting Pacing Amplitude: 2 V
Lead Channel Setting Pacing Amplitude: 2.5 V
Lead Channel Setting Pacing Pulse Width: 0.5 ms
Lead Channel Setting Pacing Pulse Width: 0.5 ms
Lead Channel Setting Sensing Sensitivity: 0.5 mV
Pulse Gen Serial Number: 111019088

## 2021-11-20 ENCOUNTER — Ambulatory Visit (INDEPENDENT_AMBULATORY_CARE_PROVIDER_SITE_OTHER): Payer: Medicare HMO

## 2021-11-20 DIAGNOSIS — I5022 Chronic systolic (congestive) heart failure: Secondary | ICD-10-CM

## 2021-11-20 DIAGNOSIS — Z95810 Presence of automatic (implantable) cardiac defibrillator: Secondary | ICD-10-CM

## 2021-11-20 NOTE — Progress Notes (Signed)
EPIC Encounter for ICM Monitoring ? ?Patient Name: Roger Lowe. is a 76 y.o. male ?Date: 11/20/2021 ?Primary Care Physican: [name], MD ?Primary Cardiologist: Harl Bowie ?Electrophysiologist: Lovena Le ?BiV Pacing: >99% ?10/09/2021 Weight: 198 lbs ?11/20/2021 Weight: 207 lbs ?   ?       Spoke with patient and heart failure questions reviewed.  Pt asymptomatic for fluid accumulation.  Reports feeling well at this time and voices no complaints.  He said he did eat gumbo over the weekend causing a little more fluid accumulation.  ?  ?CorVue thoracic impedance suggesting fluid levels returned to normal. ?  ?Prescribed: ?Furosemide 40 mg take 1 tablet daily. ?Spironolactone 25 mg take 1 tablet daily ?Farxiga 5 mg take 1 tablet daily ?  ?Labs: ?10/02/2021 Creatinine 1.42, BUN 19, Potassium 5.1, Sodium 133, GFR 52 ?09/19/2021 Creatinine 1.27, BUN 14, Potassium 4.2, Sodium 137, GFR 60  ?07/09/2021 Creatinine 1.22, BUN 7,   Potassium 4.5, Sodium 135, GFR >60 ?A complete set of results can be found in Results Review. ?  ?Recommendations:  Encouraged to limit salt and to call if he develops any fluid symptoms.  ?  ?Follow-up plan: ICM clinic phone appointment on 12/16/2021.   91 day device clinic remote transmission 02/05/2022.   ?  ?EP/Cardiology Office Visits:  05/01/2022 with Dr Harl Bowie.   Recall 02/15/2021 with Dr. Lovena Le.   ?  ?Copy of ICM check sent to Dr. Lovena Le.    ? ?3 month ICM trend: 11/20/2021. ? ? ? ?12-14 Month ICM trend:  ? ? ? ?[name], RN ?11/20/2021 ?10:01 AM ? ?

## 2021-11-21 DIAGNOSIS — J449 Chronic obstructive pulmonary disease, unspecified: Secondary | ICD-10-CM | POA: Diagnosis not present

## 2021-11-26 NOTE — Progress Notes (Signed)
Remote ICD transmission.   

## 2021-12-10 DIAGNOSIS — I10 Essential (primary) hypertension: Secondary | ICD-10-CM | POA: Diagnosis not present

## 2021-12-10 DIAGNOSIS — E1165 Type 2 diabetes mellitus with hyperglycemia: Secondary | ICD-10-CM | POA: Diagnosis not present

## 2021-12-11 DIAGNOSIS — E1065 Type 1 diabetes mellitus with hyperglycemia: Secondary | ICD-10-CM | POA: Diagnosis not present

## 2021-12-12 DIAGNOSIS — I5022 Chronic systolic (congestive) heart failure: Secondary | ICD-10-CM | POA: Diagnosis not present

## 2021-12-12 DIAGNOSIS — I5032 Chronic diastolic (congestive) heart failure: Secondary | ICD-10-CM | POA: Diagnosis not present

## 2021-12-12 DIAGNOSIS — J9601 Acute respiratory failure with hypoxia: Secondary | ICD-10-CM | POA: Diagnosis not present

## 2021-12-17 DIAGNOSIS — I700 Atherosclerosis of aorta: Secondary | ICD-10-CM | POA: Diagnosis not present

## 2021-12-17 DIAGNOSIS — J449 Chronic obstructive pulmonary disease, unspecified: Secondary | ICD-10-CM | POA: Insufficient documentation

## 2021-12-17 DIAGNOSIS — J189 Pneumonia, unspecified organism: Secondary | ICD-10-CM | POA: Diagnosis not present

## 2021-12-17 DIAGNOSIS — I10 Essential (primary) hypertension: Secondary | ICD-10-CM | POA: Diagnosis not present

## 2021-12-17 DIAGNOSIS — M109 Gout, unspecified: Secondary | ICD-10-CM | POA: Diagnosis not present

## 2021-12-17 DIAGNOSIS — K219 Gastro-esophageal reflux disease without esophagitis: Secondary | ICD-10-CM | POA: Diagnosis not present

## 2021-12-17 DIAGNOSIS — I495 Sick sinus syndrome: Secondary | ICD-10-CM | POA: Diagnosis not present

## 2021-12-17 DIAGNOSIS — E1165 Type 2 diabetes mellitus with hyperglycemia: Secondary | ICD-10-CM | POA: Diagnosis not present

## 2021-12-17 DIAGNOSIS — I2510 Atherosclerotic heart disease of native coronary artery without angina pectoris: Secondary | ICD-10-CM | POA: Diagnosis not present

## 2021-12-17 DIAGNOSIS — Z950 Presence of cardiac pacemaker: Secondary | ICD-10-CM | POA: Diagnosis not present

## 2021-12-17 DIAGNOSIS — N1831 Chronic kidney disease, stage 3a: Secondary | ICD-10-CM | POA: Diagnosis not present

## 2021-12-17 DIAGNOSIS — D6859 Other primary thrombophilia: Secondary | ICD-10-CM | POA: Diagnosis not present

## 2021-12-17 DIAGNOSIS — Z9981 Dependence on supplemental oxygen: Secondary | ICD-10-CM | POA: Diagnosis not present

## 2021-12-18 ENCOUNTER — Telehealth: Payer: Self-pay

## 2021-12-18 ENCOUNTER — Ambulatory Visit (INDEPENDENT_AMBULATORY_CARE_PROVIDER_SITE_OTHER): Payer: Medicare HMO

## 2021-12-18 DIAGNOSIS — I5022 Chronic systolic (congestive) heart failure: Secondary | ICD-10-CM | POA: Diagnosis not present

## 2021-12-18 DIAGNOSIS — Z95810 Presence of automatic (implantable) cardiac defibrillator: Secondary | ICD-10-CM

## 2021-12-18 NOTE — Progress Notes (Signed)
EPIC Encounter for ICM Monitoring ? ?Patient Name: Roger Lowe. is a 76 y.o. male ?Date: 12/18/2021 ?Primary Care Physican: [name], MD ?Primary Cardiologist: Harl Bowie ?Electrophysiologist: Lovena Le ?BiV Pacing: >99% ?10/09/2021 Weight: 198 lbs ?11/20/2021 Weight: 207 lbs ?   ?       Attempted call to patient and unable to reach.  Left message to return call. Transmission reviewed.  ?  ?CorVue thoracic impedance suggesting possible fluid accumulation starting 4/13 and returned to baseline 4/19.  Impedance suggesting possible dryness 4/3-4/12. ?  ?Prescribed: ?Furosemide 40 mg take 1 tablet daily. ?Spironolactone 25 mg take 1 tablet daily ?Farxiga 5 mg take 1 tablet daily ?  ?Labs: ?12/13/2021 Creatinine 1.53, BUN 13, Potassium 5.3, Sodium 136, GFR 48 ?10/02/2021 Creatinine 1.42, BUN 19, Potassium 5.1, Sodium 133, GFR 52 ?09/19/2021 Creatinine 1.27, BUN 14, Potassium 4.2, Sodium 137, GFR 60  ?07/09/2021 Creatinine 1.22, BUN 7,   Potassium 4.5, Sodium 135, GFR >60 ?A complete set of results can be found in Results Review. ?  ?Recommendations:  Unable to reach.    ?  ?Follow-up plan: ICM clinic phone appointment on 01/20/2022.   91 day device clinic remote transmission 02/17/2022.   ?  ?EP/Cardiology Office Visits:  05/01/2022 with Dr Harl Bowie.   Recall 02/15/2021 with Dr. Lovena Le.   ?  ?Copy of ICM check sent to Dr. Lovena Le.    ? ?3 month ICM trend: 12/18/2021. ? ? ? ?12-14 Month ICM trend:  ? ? ? ?[name], RN ?12/18/2021 ?7:38 AM ? ?

## 2021-12-18 NOTE — Telephone Encounter (Signed)
Remote ICM transmission received.  Attempted call to patient regarding ICM remote transmission and left message to return call   

## 2021-12-21 ENCOUNTER — Other Ambulatory Visit: Payer: Self-pay | Admitting: Primary Care

## 2021-12-29 DIAGNOSIS — N1831 Chronic kidney disease, stage 3a: Secondary | ICD-10-CM | POA: Diagnosis not present

## 2021-12-29 DIAGNOSIS — I129 Hypertensive chronic kidney disease with stage 1 through stage 4 chronic kidney disease, or unspecified chronic kidney disease: Secondary | ICD-10-CM | POA: Diagnosis not present

## 2021-12-29 DIAGNOSIS — E1165 Type 2 diabetes mellitus with hyperglycemia: Secondary | ICD-10-CM | POA: Diagnosis not present

## 2021-12-29 DIAGNOSIS — E785 Hyperlipidemia, unspecified: Secondary | ICD-10-CM | POA: Diagnosis not present

## 2021-12-31 DIAGNOSIS — J449 Chronic obstructive pulmonary disease, unspecified: Secondary | ICD-10-CM | POA: Diagnosis not present

## 2022-01-07 ENCOUNTER — Telehealth: Payer: Self-pay

## 2022-01-07 NOTE — Telephone Encounter (Signed)
Return call to patient as requested and spoke with patient.  He said he is feeling okay but having some hip pain but he is feeling better. Discussed remote 4/19 transmission and advised next remote transmission 5/22. ?

## 2022-01-10 DIAGNOSIS — E1065 Type 1 diabetes mellitus with hyperglycemia: Secondary | ICD-10-CM | POA: Diagnosis not present

## 2022-01-11 DIAGNOSIS — J9601 Acute respiratory failure with hypoxia: Secondary | ICD-10-CM | POA: Diagnosis not present

## 2022-01-11 DIAGNOSIS — I5032 Chronic diastolic (congestive) heart failure: Secondary | ICD-10-CM | POA: Diagnosis not present

## 2022-01-11 DIAGNOSIS — I5022 Chronic systolic (congestive) heart failure: Secondary | ICD-10-CM | POA: Diagnosis not present

## 2022-01-16 DIAGNOSIS — M25552 Pain in left hip: Secondary | ICD-10-CM | POA: Diagnosis not present

## 2022-01-16 DIAGNOSIS — R262 Difficulty in walking, not elsewhere classified: Secondary | ICD-10-CM | POA: Diagnosis not present

## 2022-01-16 DIAGNOSIS — R0600 Dyspnea, unspecified: Secondary | ICD-10-CM | POA: Diagnosis not present

## 2022-01-20 ENCOUNTER — Ambulatory Visit (INDEPENDENT_AMBULATORY_CARE_PROVIDER_SITE_OTHER): Payer: Medicare HMO

## 2022-01-20 ENCOUNTER — Ambulatory Visit (INDEPENDENT_AMBULATORY_CARE_PROVIDER_SITE_OTHER): Payer: Medicare HMO | Admitting: Pulmonary Disease

## 2022-01-20 ENCOUNTER — Encounter: Payer: Self-pay | Admitting: Pulmonary Disease

## 2022-01-20 VITALS — BP 156/90 | HR 89 | Temp 97.8°F | Ht 72.0 in | Wt 226.8 lb

## 2022-01-20 DIAGNOSIS — Z85118 Personal history of other malignant neoplasm of bronchus and lung: Secondary | ICD-10-CM | POA: Diagnosis not present

## 2022-01-20 DIAGNOSIS — Z95810 Presence of automatic (implantable) cardiac defibrillator: Secondary | ICD-10-CM

## 2022-01-20 DIAGNOSIS — Z2239 Carrier of other specified bacterial diseases: Secondary | ICD-10-CM

## 2022-01-20 DIAGNOSIS — I5022 Chronic systolic (congestive) heart failure: Secondary | ICD-10-CM

## 2022-01-20 DIAGNOSIS — R911 Solitary pulmonary nodule: Secondary | ICD-10-CM

## 2022-01-20 DIAGNOSIS — Z87891 Personal history of nicotine dependence: Secondary | ICD-10-CM

## 2022-01-20 DIAGNOSIS — J449 Chronic obstructive pulmonary disease, unspecified: Secondary | ICD-10-CM | POA: Diagnosis not present

## 2022-01-20 MED ORDER — AZITHROMYCIN 500 MG PO TABS
500.0000 mg | ORAL_TABLET | ORAL | 3 refills | Status: AC
Start: 2022-01-20 — End: 2022-04-20

## 2022-01-20 MED ORDER — ALBUTEROL SULFATE HFA 108 (90 BASE) MCG/ACT IN AERS
1.0000 | INHALATION_SPRAY | Freq: Four times a day (QID) | RESPIRATORY_TRACT | 6 refills | Status: AC | PRN
Start: 2022-01-20 — End: ?
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
## 2022-01-20 NOTE — Patient Instructions (Addendum)
Thank you for visiting Dr. Valeta Harms at [facility]. Today we recommend the following:  Meds ordered this encounter  Medications   albuterol (VENTOLIN HFA) 108 (90 Base) MCG/ACT inhaler    Sig: Inhale 1-2 puffs into the lungs every 6 (six) hours as needed for shortness of breath or wheezing.    Dispense:  18 g    Refill:  6   azithromycin (ZITHROMAX) 500 MG tablet    Sig: Take 1 tablet (500 mg total) by mouth 3 (three) times a week. Only on Mondays, Wednesdays and Fridays only.    Dispense:  15 tablet    Refill:  3   Follow up CT scheduled in August 2023  Return in about 6 months (around 07/23/2022) for with APP or Dr. Valeta Harms.    Please do your part to reduce the spread of COVID-19.

## 2022-01-21 ENCOUNTER — Ambulatory Visit: Payer: Medicare HMO | Admitting: Acute Care

## 2022-01-22 ENCOUNTER — Telehealth: Payer: Self-pay

## 2022-01-22 NOTE — Progress Notes (Signed)
EPIC Encounter for ICM Monitoring  Patient Name: Roger Lowe. is a 76 y.o. male Date: 01/22/2022 Primary Care Physican: [name], MD Primary Cardiologist: Harl Bowie Electrophysiologist: Lovena Le BiV Pacing: >99% 10/09/2021 Weight: 198 lbs 11/20/2021 Weight: 207 lbs           Attempted call to patient and unable to reach.  Left message to return call. Transmission reviewed.    CorVue thoracic impedance suggesting possible fluid accumulation starting 5/14 and returned to baseline 5/21.     Prescribed: Furosemide 40 mg take 1 tablet daily. Spironolactone 25 mg take 1 tablet daily Farxiga 5 mg take 1 tablet daily   Labs: 12/13/2021 Creatinine 1.53, BUN 13, Potassium 5.3, Sodium 136, GFR 48 10/02/2021 Creatinine 1.42, BUN 19, Potassium 5.1, Sodium 133, GFR 52 09/19/2021 Creatinine 1.27, BUN 14, Potassium 4.2, Sodium 137, GFR 60  07/09/2021 Creatinine 1.22, BUN 7,   Potassium 4.5, Sodium 135, GFR >60 A complete set of results can be found in Results Review.   Recommendations:  Unable to reach.      Follow-up plan: ICM clinic phone appointment on 02/24/2022.   91 day device clinic remote transmission 02/17/2022.     EP/Cardiology Office Visits:  05/01/2022 with Dr Harl Bowie.   Recall 02/15/2021 with Dr. Lovena Le.     Copy of ICM check sent to Dr. Lovena Le.     3 month ICM trend: 01/20/2022.    12-14 Month ICM trend:     [name], RN 01/22/2022 10:52 AM

## 2022-01-22 NOTE — Telephone Encounter (Signed)
Remote ICM transmission received.  Attempted call to patient regarding ICM remote transmission and left message to return call   

## 2022-01-24 DIAGNOSIS — M5432 Sciatica, left side: Secondary | ICD-10-CM | POA: Diagnosis not present

## 2022-02-06 ENCOUNTER — Inpatient Hospital Stay (HOSPITAL_COMMUNITY): Payer: Medicare HMO | Attending: Hematology

## 2022-02-06 DIAGNOSIS — Z86718 Personal history of other venous thrombosis and embolism: Secondary | ICD-10-CM | POA: Diagnosis not present

## 2022-02-06 DIAGNOSIS — Z85118 Personal history of other malignant neoplasm of bronchus and lung: Secondary | ICD-10-CM | POA: Insufficient documentation

## 2022-02-06 DIAGNOSIS — Z08 Encounter for follow-up examination after completed treatment for malignant neoplasm: Secondary | ICD-10-CM | POA: Insufficient documentation

## 2022-02-06 DIAGNOSIS — C3490 Malignant neoplasm of unspecified part of unspecified bronchus or lung: Secondary | ICD-10-CM

## 2022-02-06 DIAGNOSIS — Z86711 Personal history of pulmonary embolism: Secondary | ICD-10-CM | POA: Insufficient documentation

## 2022-02-06 DIAGNOSIS — Z7901 Long term (current) use of anticoagulants: Secondary | ICD-10-CM | POA: Diagnosis not present

## 2022-02-06 DIAGNOSIS — R911 Solitary pulmonary nodule: Secondary | ICD-10-CM | POA: Insufficient documentation

## 2022-02-06 LAB — COMPREHENSIVE METABOLIC PANEL
ALT: 68 U/L — ABNORMAL HIGH (ref 0–44)
AST: 35 U/L (ref 15–41)
Albumin: 4 g/dL (ref 3.5–5.0)
Alkaline Phosphatase: 101 U/L (ref 38–126)
Anion gap: 9 (ref 5–15)
BUN: 25 mg/dL — ABNORMAL HIGH (ref 8–23)
CO2: 28 mmol/L (ref 22–32)
Calcium: 9.8 mg/dL (ref 8.9–10.3)
Chloride: 101 mmol/L (ref 98–111)
Creatinine, Ser: 1.32 mg/dL — ABNORMAL HIGH (ref 0.61–1.24)
GFR, Estimated: 57 mL/min — ABNORMAL LOW (ref >60)
Glucose, Bld: 191 mg/dL — ABNORMAL HIGH (ref 70–99)
Potassium: 4.1 mmol/L (ref 3.5–5.1)
Sodium: 138 mmol/L (ref 135–145)
Total Bilirubin: 0.5 mg/dL (ref 0.3–1.2)
Total Protein: 8.1 g/dL (ref 6.5–8.1)

## 2022-02-06 LAB — CBC WITH DIFFERENTIAL/PLATELET
Abs Immature Granulocytes: 0.12 10*3/uL — ABNORMAL HIGH (ref 0.00–0.07)
Basophils Absolute: 0.1 10*3/uL (ref 0.0–0.1)
Basophils Relative: 1 %
Eosinophils Absolute: 0.1 10*3/uL (ref 0.0–0.5)
Eosinophils Relative: 1 %
HCT: 51.1 % (ref 39.0–52.0)
Hemoglobin: 16.3 g/dL (ref 13.0–17.0)
Immature Granulocytes: 1 %
Lymphocytes Relative: 24 %
Lymphs Abs: 3 10*3/uL (ref 0.7–4.0)
MCH: 29.4 pg (ref 26.0–34.0)
MCHC: 31.9 g/dL (ref 30.0–36.0)
MCV: 92.1 fL (ref 80.0–100.0)
Monocytes Absolute: 1 10*3/uL (ref 0.1–1.0)
Monocytes Relative: 8 %
Neutro Abs: 8 10*3/uL — ABNORMAL HIGH (ref 1.7–7.7)
Neutrophils Relative %: 65 %
Platelets: 167 10*3/uL (ref 150–400)
RBC: 5.55 MIL/uL (ref 4.22–5.81)
RDW: 14.2 % (ref 11.5–15.5)
WBC: 12.2 10*3/uL — ABNORMAL HIGH (ref 4.0–10.5)
nRBC: 0 % (ref 0.0–0.2)

## 2022-02-09 DIAGNOSIS — E1065 Type 1 diabetes mellitus with hyperglycemia: Secondary | ICD-10-CM | POA: Diagnosis not present

## 2022-02-10 DIAGNOSIS — J449 Chronic obstructive pulmonary disease, unspecified: Secondary | ICD-10-CM | POA: Diagnosis not present

## 2022-02-10 LAB — PROTEIN ELECTROPHORESIS, SERUM
A/G Ratio: 0.9 (ref 0.7–1.7)
Albumin ELP: 3.7 g/dL (ref 2.9–4.4)
Alpha-1-Globulin: 0.2 g/dL (ref 0.0–0.4)
Alpha-2-Globulin: 0.9 g/dL (ref 0.4–1.0)
Beta Globulin: 1.2 g/dL (ref 0.7–1.3)
Gamma Globulin: 1.7 g/dL (ref 0.4–1.8)
Globulin, Total: 4.1 g/dL — ABNORMAL HIGH (ref 2.2–3.9)
Total Protein ELP: 7.8 g/dL (ref 6.0–8.5)

## 2022-02-11 DIAGNOSIS — J9601 Acute respiratory failure with hypoxia: Secondary | ICD-10-CM | POA: Diagnosis not present

## 2022-02-11 DIAGNOSIS — I5032 Chronic diastolic (congestive) heart failure: Secondary | ICD-10-CM | POA: Diagnosis not present

## 2022-02-11 DIAGNOSIS — I5022 Chronic systolic (congestive) heart failure: Secondary | ICD-10-CM | POA: Diagnosis not present

## 2022-02-13 ENCOUNTER — Encounter (HOSPITAL_COMMUNITY): Payer: Self-pay | Admitting: Hematology

## 2022-02-13 ENCOUNTER — Inpatient Hospital Stay (HOSPITAL_BASED_OUTPATIENT_CLINIC_OR_DEPARTMENT_OTHER): Payer: Medicare HMO | Admitting: Hematology

## 2022-02-13 VITALS — BP 155/85 | HR 96 | Temp 96.7°F | Resp 20 | Ht 70.08 in | Wt 213.6 lb

## 2022-02-13 DIAGNOSIS — Z86711 Personal history of pulmonary embolism: Secondary | ICD-10-CM | POA: Diagnosis not present

## 2022-02-13 DIAGNOSIS — C3490 Malignant neoplasm of unspecified part of unspecified bronchus or lung: Secondary | ICD-10-CM

## 2022-02-13 DIAGNOSIS — Z08 Encounter for follow-up examination after completed treatment for malignant neoplasm: Secondary | ICD-10-CM | POA: Diagnosis not present

## 2022-02-13 DIAGNOSIS — Z85118 Personal history of other malignant neoplasm of bronchus and lung: Secondary | ICD-10-CM | POA: Diagnosis not present

## 2022-02-13 DIAGNOSIS — Z86718 Personal history of other venous thrombosis and embolism: Secondary | ICD-10-CM | POA: Diagnosis not present

## 2022-02-13 DIAGNOSIS — R911 Solitary pulmonary nodule: Secondary | ICD-10-CM | POA: Diagnosis not present

## 2022-02-13 DIAGNOSIS — Z7901 Long term (current) use of anticoagulants: Secondary | ICD-10-CM | POA: Diagnosis not present

## 2022-02-13 NOTE — Patient Instructions (Addendum)
[place] at [facility] Discharge Instructions  You were seen and examined today by Dr. Delton Coombes.  Dr. Delton Coombes discussed your most recent lab work and everything looks good. Continue taking antibiotic that Dr. Valeta Harms gave you until it is finished.  Follow-up as scheduled after CT scan in August 2023.    Thank you for choosing [place] at [facility] to provide your oncology and hematology care.  To afford each patient quality time with our provider, please arrive at least 15 minutes before your scheduled appointment time.   If you have a lab appointment with the [facility] please come in thru the Main Entrance and check in at the main information desk.  You need to re-schedule your appointment should you arrive 10 or more minutes late.  We strive to give you quality time with our providers, and arriving late affects you and other patients whose appointments are after yours.  Also, if you no show three or more times for appointments you may be dismissed from the clinic at the providers discretion.     Again, thank you for choosing [facility].  Our hope is that these requests will decrease the amount of time that you wait before being seen by our physicians.       _____________________________________________________________  Should you have questions after your visit to [facility], please contact our office at [phone] and follow the prompts.  Our office hours are 8:00 a.m. and 4:30 p.m. Monday - Friday.  Please note that voicemails left after 4:00 p.m. may not be returned until the following business day.  We are closed weekends and major holidays.  You do have access to a nurse 24-7, just call the main number to the clinic [phone] and do not press any options, hold on the line and a nurse will answer the phone.    For prescription refill requests, have your pharmacy contact our office and allow 72  hours.    Due to Covid, you will need to wear a mask upon entering the hospital. If you do not have a mask, a mask will be given to you at the Main Entrance upon arrival. For doctor visits, patients may have 1 support person age 81 or older with them. For treatment visits, patients can not have anyone with them due to social distancing guidelines and our immunocompromised population.
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
## 2022-02-17 ENCOUNTER — Ambulatory Visit (INDEPENDENT_AMBULATORY_CARE_PROVIDER_SITE_OTHER): Payer: Medicare HMO

## 2022-02-17 DIAGNOSIS — I428 Other cardiomyopathies: Secondary | ICD-10-CM | POA: Diagnosis not present

## 2022-02-22 ENCOUNTER — Other Ambulatory Visit: Payer: Self-pay | Admitting: Cardiology

## 2022-02-24 ENCOUNTER — Ambulatory Visit (INDEPENDENT_AMBULATORY_CARE_PROVIDER_SITE_OTHER): Payer: Medicare HMO

## 2022-02-24 DIAGNOSIS — I5022 Chronic systolic (congestive) heart failure: Secondary | ICD-10-CM | POA: Diagnosis not present

## 2022-02-24 DIAGNOSIS — Z95810 Presence of automatic (implantable) cardiac defibrillator: Secondary | ICD-10-CM | POA: Diagnosis not present

## 2022-02-24 LAB — CUP PACEART REMOTE DEVICE CHECK
Battery Remaining Longevity: 70 mo
Battery Remaining Percentage: 73 %
Battery Voltage: 2.96 V
Brady Statistic AP VP Percent: 1 %
Brady Statistic AP VS Percent: 1 %
Brady Statistic AS VP Percent: 99 %
Brady Statistic AS VS Percent: 1 %
Brady Statistic RA Percent Paced: 1 %
Date Time Interrogation Session: 20230623201246
HighPow Impedance: 93 Ohm
Implantable Lead Implant Date: 20210308
Implantable Lead Implant Date: 20210308
Implantable Lead Implant Date: 20210308
Implantable Lead Location: 753858
Implantable Lead Location: 753859
Implantable Lead Location: 753860
Implantable Lead Model: 7122
Implantable Pulse Generator Implant Date: 20210308
Lead Channel Impedance Value: 1175 Ohm
Lead Channel Impedance Value: 410 Ohm
Lead Channel Impedance Value: 590 Ohm
Lead Channel Pacing Threshold Amplitude: 0.625 V
Lead Channel Pacing Threshold Amplitude: 0.75 V
Lead Channel Pacing Threshold Amplitude: 1.25 V
Lead Channel Pacing Threshold Pulse Width: 0.5 ms
Lead Channel Pacing Threshold Pulse Width: 0.5 ms
Lead Channel Pacing Threshold Pulse Width: 0.5 ms
Lead Channel Sensing Intrinsic Amplitude: 11.8 mV
Lead Channel Sensing Intrinsic Amplitude: 4.7 mV
Lead Channel Setting Pacing Amplitude: 1.625
Lead Channel Setting Pacing Amplitude: 2 V
Lead Channel Setting Pacing Amplitude: 2.5 V
Lead Channel Setting Pacing Pulse Width: 0.5 ms
Lead Channel Setting Pacing Pulse Width: 0.5 ms
Lead Channel Setting Sensing Sensitivity: 0.5 mV
Pulse Gen Serial Number: 111019088

## 2022-02-26 NOTE — Progress Notes (Signed)
EPIC Encounter for ICM Monitoring  Patient Name: Roger Lowe. is a 76 y.o. male Date: 02/26/2022 Primary Care Physican: [name], MD Primary Cardiologist: Harl Bowie Electrophysiologist: Lovena Le BiV Pacing: >99% 10/09/2021 Weight: 198 lbs 11/20/2021 Weight: 207 lbs           Attempted call to patient and unable to reach.  Left message to return call. Transmission reviewed.    CorVue thoracic impedance suggesting possible fluid accumulation starting 6/24.     Prescribed: Furosemide 40 mg take 1 tablet daily. Spironolactone 25 mg take 1 tablet daily Farxiga 5 mg take 1 tablet daily   Labs: 12/13/2021 Creatinine 1.53, BUN 13, Potassium 5.3, Sodium 136, GFR 48 10/02/2021 Creatinine 1.42, BUN 19, Potassium 5.1, Sodium 133, GFR 52 09/19/2021 Creatinine 1.27, BUN 14, Potassium 4.2, Sodium 137, GFR 60  07/09/2021 Creatinine 1.22, BUN 7,   Potassium 4.5, Sodium 135, GFR >60 A complete set of results can be found in Results Review.   Recommendations:  Unable to reach.      Follow-up plan: ICM clinic phone appointment on 03/03/2022 to recheck fluid levels.   91 day device clinic remote transmission 02/17/2022.     EP/Cardiology Office Visits:  05/01/2022 with Dr Harl Bowie.   Recall 02/15/2021 with Dr. Lovena Le.     Copy of ICM check sent to Dr. Lovena Le.   Will sent to Dr Harl Bowie for review if patient is reached.  3 month ICM trend: 02/24/2022.    12-14 Month ICM trend:     [name], RN 02/26/2022 12:39 PM

## 2022-02-26 NOTE — Progress Notes (Signed)
Spoke with patient and heart failure questions reviewed.  Pt asymptomatic for fluid accumulation.  He was not eating or drinking much when impedance suggesting dryness 5/22-6/18.   Advised to consistently drink at least 50-60 oz fluid daily to stay hydrated and helps to maintain balance.  He will do so.  No changes and encouraged to call if experiencing any fluid symptoms.

## 2022-03-03 ENCOUNTER — Ambulatory Visit (INDEPENDENT_AMBULATORY_CARE_PROVIDER_SITE_OTHER): Payer: Medicare HMO

## 2022-03-03 DIAGNOSIS — I5022 Chronic systolic (congestive) heart failure: Secondary | ICD-10-CM

## 2022-03-03 DIAGNOSIS — M5432 Sciatica, left side: Secondary | ICD-10-CM | POA: Diagnosis not present

## 2022-03-03 DIAGNOSIS — Z95810 Presence of automatic (implantable) cardiac defibrillator: Secondary | ICD-10-CM

## 2022-03-03 NOTE — Progress Notes (Signed)
EPIC Encounter for ICM Monitoring  Patient Name: Roger Lowe. is a 76 y.o. male Date: 03/03/2022 Primary Care Physican: [name], MD Primary Cardiologist: Branch Electrophysiologist: Lovena Le BiV Pacing: >99% 10/09/2021 Weight: 198 lbs 11/20/2021 Weight: 207 lbs 02/13/2022 Office Weight: 213 lbs           Spoke with patient and heart failure questions reviewed.  Pt reports he is feeling well at this time and voices no complaints. He denies any fluid symptoms.  He may be eating too much salt.  Fluid intake is <64 oz.   CorVue thoracic impedance suggesting possible fluid accumulation starting 6/28.     Prescribed: Furosemide 40 mg take 1 tablet daily. Spironolactone 25 mg take 1 tablet daily Farxiga 5 mg take 1 tablet daily   Labs: 12/13/2021 Creatinine 1.53, BUN 13, Potassium 5.3, Sodium 136, GFR 48 10/02/2021 Creatinine 1.42, BUN 19, Potassium 5.1, Sodium 133, GFR 52 09/19/2021 Creatinine 1.27, BUN 14, Potassium 4.2, Sodium 137, GFR 60  07/09/2021 Creatinine 1.22, BUN 7,   Potassium 4.5, Sodium 135, GFR >60 A complete set of results can be found in Results Review.   Recommendations:  Advised to limit salt intake and call if experiencing any fluid symptoms.       Follow-up plan: ICM clinic phone appointment on 03/10/2022 to recheck fluid levels.   91 day device clinic remote transmission 05/19/2022.     EP/Cardiology Office Visits:  05/01/2022 with Dr Harl Bowie.   Recall 02/15/2021 with Dr. Lovena Le.     Copy of ICM check sent to Dr. Lovena Le and Dr Harl Bowie for review and recommendations if needed.   3 month ICM trend: 03/03/2022.    12-14 Month ICM trend:     [name], RN 03/03/2022 10:44 AM

## 2022-03-06 NOTE — Progress Notes (Signed)
Remote ICD transmission.   

## 2022-03-10 ENCOUNTER — Ambulatory Visit (INDEPENDENT_AMBULATORY_CARE_PROVIDER_SITE_OTHER): Payer: Medicare HMO

## 2022-03-10 DIAGNOSIS — I5022 Chronic systolic (congestive) heart failure: Secondary | ICD-10-CM

## 2022-03-10 DIAGNOSIS — Z95810 Presence of automatic (implantable) cardiac defibrillator: Secondary | ICD-10-CM

## 2022-03-11 ENCOUNTER — Telehealth: Payer: Self-pay

## 2022-03-11 DIAGNOSIS — E1065 Type 1 diabetes mellitus with hyperglycemia: Secondary | ICD-10-CM | POA: Diagnosis not present

## 2022-03-11 NOTE — Progress Notes (Signed)
EPIC Encounter for ICM Monitoring  Patient Name: Roger Lowe. is a 76 y.o. male Date: 03/11/2022 Primary Care Physican: [name], MD Primary Cardiologist: Branch Electrophysiologist: Lovena Le BiV Pacing: >99% 10/09/2021 Weight: 198 lbs 11/20/2021 Weight: 207 lbs 02/13/2022 Office Weight: 213 lbs           Attempted call to patient and unable to reach.  Left message to return call. Transmission reviewed.    CorVue thoracic impedance suggesting fluid levels returned to normal.     Prescribed: Furosemide 40 mg take 1 tablet daily. Spironolactone 25 mg take 1 tablet daily Farxiga 5 mg take 1 tablet daily   Labs: 12/13/2021 Creatinine 1.53, BUN 13, Potassium 5.3, Sodium 136, GFR 48 10/02/2021 Creatinine 1.42, BUN 19, Potassium 5.1, Sodium 133, GFR 52 09/19/2021 Creatinine 1.27, BUN 14, Potassium 4.2, Sodium 137, GFR 60  07/09/2021 Creatinine 1.22, BUN 7,   Potassium 4.5, Sodium 135, GFR >60 A complete set of results can be found in Results Review.   Recommendations:  Unable to reach.     Follow-up plan: ICM clinic phone appointment on 03/31/2022.   91 day device clinic remote transmission 05/19/2022.     EP/Cardiology Office Visits:  05/01/2022 with Dr Harl Bowie.   Recall 02/15/2021 with Dr. Lovena Le.     Copy of ICM check sent to Dr. Lovena Le.   3 month ICM trend: 03/10/2022.    12-14 Month ICM trend:     [name], RN 03/11/2022 12:26 PM

## 2022-03-11 NOTE — Telephone Encounter (Signed)
Remote ICM transmission received.  Attempted call to patient regarding ICM remote transmission and left message to return call   

## 2022-03-13 DIAGNOSIS — I5022 Chronic systolic (congestive) heart failure: Secondary | ICD-10-CM | POA: Diagnosis not present

## 2022-03-13 DIAGNOSIS — I5032 Chronic diastolic (congestive) heart failure: Secondary | ICD-10-CM | POA: Diagnosis not present

## 2022-03-13 DIAGNOSIS — J9601 Acute respiratory failure with hypoxia: Secondary | ICD-10-CM | POA: Diagnosis not present

## 2022-03-15 ENCOUNTER — Other Ambulatory Visit: Payer: Self-pay | Admitting: Cardiology

## 2022-03-17 ENCOUNTER — Other Ambulatory Visit: Payer: Self-pay

## 2022-03-17 MED ORDER — CARVEDILOL 25 MG PO TABS: 25.0000 mg | ORAL_TABLET | Freq: Two times a day (BID) | ORAL | 3 refills | Status: DC

## 2022-03-19 DIAGNOSIS — J449 Chronic obstructive pulmonary disease, unspecified: Secondary | ICD-10-CM | POA: Diagnosis not present

## 2022-03-20 DIAGNOSIS — E1165 Type 2 diabetes mellitus with hyperglycemia: Secondary | ICD-10-CM | POA: Diagnosis not present

## 2022-03-20 DIAGNOSIS — E782 Mixed hyperlipidemia: Secondary | ICD-10-CM | POA: Diagnosis not present

## 2022-03-20 DIAGNOSIS — M109 Gout, unspecified: Secondary | ICD-10-CM | POA: Diagnosis not present

## 2022-03-27 DIAGNOSIS — J189 Pneumonia, unspecified organism: Secondary | ICD-10-CM | POA: Diagnosis not present

## 2022-03-27 DIAGNOSIS — Z9981 Dependence on supplemental oxygen: Secondary | ICD-10-CM | POA: Diagnosis not present

## 2022-03-27 DIAGNOSIS — E1165 Type 2 diabetes mellitus with hyperglycemia: Secondary | ICD-10-CM | POA: Diagnosis not present

## 2022-03-27 DIAGNOSIS — K219 Gastro-esophageal reflux disease without esophagitis: Secondary | ICD-10-CM | POA: Diagnosis not present

## 2022-03-27 DIAGNOSIS — I10 Essential (primary) hypertension: Secondary | ICD-10-CM | POA: Diagnosis not present

## 2022-03-27 DIAGNOSIS — N1831 Chronic kidney disease, stage 3a: Secondary | ICD-10-CM | POA: Diagnosis not present

## 2022-03-27 DIAGNOSIS — I2510 Atherosclerotic heart disease of native coronary artery without angina pectoris: Secondary | ICD-10-CM | POA: Diagnosis not present

## 2022-03-27 DIAGNOSIS — Z950 Presence of cardiac pacemaker: Secondary | ICD-10-CM | POA: Diagnosis not present

## 2022-03-27 DIAGNOSIS — M109 Gout, unspecified: Secondary | ICD-10-CM | POA: Diagnosis not present

## 2022-03-31 ENCOUNTER — Ambulatory Visit (INDEPENDENT_AMBULATORY_CARE_PROVIDER_SITE_OTHER): Payer: Medicare HMO

## 2022-03-31 DIAGNOSIS — I5022 Chronic systolic (congestive) heart failure: Secondary | ICD-10-CM | POA: Diagnosis not present

## 2022-03-31 DIAGNOSIS — Z95810 Presence of automatic (implantable) cardiac defibrillator: Secondary | ICD-10-CM | POA: Diagnosis not present

## 2022-04-01 ENCOUNTER — Ambulatory Visit (HOSPITAL_COMMUNITY)
Admission: RE | Admit: 2022-04-01 | Discharge: 2022-04-01 | Disposition: A | Discharge status: Home / Self Care | Payer: Medicare HMO | Source: Ambulatory Visit | Attending: Acute Care | Admitting: Acute Care

## 2022-04-01 DIAGNOSIS — I700 Atherosclerosis of aorta: Secondary | ICD-10-CM | POA: Diagnosis not present

## 2022-04-01 DIAGNOSIS — R911 Solitary pulmonary nodule: Secondary | ICD-10-CM | POA: Diagnosis not present

## 2022-04-01 DIAGNOSIS — J439 Emphysema, unspecified: Secondary | ICD-10-CM | POA: Insufficient documentation

## 2022-04-01 DIAGNOSIS — J479 Bronchiectasis, uncomplicated: Secondary | ICD-10-CM | POA: Insufficient documentation

## 2022-04-02 ENCOUNTER — Telehealth: Payer: Self-pay

## 2022-04-02 NOTE — Progress Notes (Signed)
EPIC Encounter for ICM Monitoring  Patient Name: Roger Lowe. is a 76 y.o. male Date: 04/02/2022 Primary Care Physican: [name], MD Primary Cardiologist: Branch Electrophysiologist: Lovena Le BiV Pacing: >99% 10/09/2021 Weight: 198 lbs 11/20/2021 Weight: 207 lbs 02/13/2022 Office Weight: 213 lbs           Attempted call to patient and unable to reach.  Left message to return call. Transmission reviewed.    CorVue thoracic impedance suggesting normal fluid levels.     Prescribed: Furosemide 40 mg take 1 tablet daily. Spironolactone 25 mg take 1 tablet daily Farxiga 5 mg take 1 tablet daily   Labs: 12/13/2021 Creatinine 1.53, BUN 13, Potassium 5.3, Sodium 136, GFR 48 10/02/2021 Creatinine 1.42, BUN 19, Potassium 5.1, Sodium 133, GFR 52 09/19/2021 Creatinine 1.27, BUN 14, Potassium 4.2, Sodium 137, GFR 60  07/09/2021 Creatinine 1.22, BUN 7,   Potassium 4.5, Sodium 135, GFR >60 A complete set of results can be found in Results Review.   Recommendations:  Unable to reach.     Follow-up plan: ICM clinic phone appointment on 05/06/2022.   91 day device clinic remote transmission 05/19/2022.     EP/Cardiology Office Visits:  05/01/2022 with Dr Harl Bowie.   Recall 02/15/2021 with Dr. Lovena Le.     Copy of ICM check sent to Dr. Lovena Le.   3 month ICM trend: 03/31/2022.    12-14 Month ICM trend:     [name], RN 04/02/2022 2:04 PM

## 2022-04-02 NOTE — Telephone Encounter (Signed)
Remote ICM transmission received.  Attempted call to patient regarding ICM remote transmission and left message per DPR to return call.   

## 2022-04-08 ENCOUNTER — Inpatient Hospital Stay: Payer: Medicare HMO | Attending: Hematology | Admitting: Hematology

## 2022-04-08 VITALS — BP 139/91 | HR 100 | Temp 97.4°F | Resp 19 | Ht 72.0 in | Wt 214.4 lb

## 2022-04-08 DIAGNOSIS — Z923 Personal history of irradiation: Secondary | ICD-10-CM | POA: Insufficient documentation

## 2022-04-08 DIAGNOSIS — C3490 Malignant neoplasm of unspecified part of unspecified bronchus or lung: Secondary | ICD-10-CM

## 2022-04-08 DIAGNOSIS — Z7901 Long term (current) use of anticoagulants: Secondary | ICD-10-CM | POA: Diagnosis not present

## 2022-04-08 DIAGNOSIS — M545 Low back pain, unspecified: Secondary | ICD-10-CM | POA: Insufficient documentation

## 2022-04-08 DIAGNOSIS — Z86718 Personal history of other venous thrombosis and embolism: Secondary | ICD-10-CM | POA: Diagnosis not present

## 2022-04-08 DIAGNOSIS — R911 Solitary pulmonary nodule: Secondary | ICD-10-CM | POA: Diagnosis not present

## 2022-04-08 NOTE — Patient Instructions (Addendum)
[place] at [facility] Discharge Instructions  You were seen and examined today by Dr. Delton Coombes.  Dr. Delton Coombes discussed your most recent lab work and CT scan which revealed that everything is stable. Dr. Delton Coombes recommends that you follow up with Dr. Valeta Harms.  Follow-up as scheduled in 6 months.    Thank you for choosing [place] at [facility] to provide your oncology and hematology care.  To afford each patient quality time with our provider, please arrive at least 15 minutes before your scheduled appointment time.   If you have a lab appointment with the [place] please come in thru the Main Entrance and check in at the main information desk.  You need to re-schedule your appointment should you arrive 10 or more minutes late.  We strive to give you quality time with our providers, and arriving late affects you and other patients whose appointments are after yours.  Also, if you no show three or more times for appointments you may be dismissed from the clinic at the providers discretion.     Again, thank you for choosing [facility].  Our hope is that these requests will decrease the amount of time that you wait before being seen by our physicians.       _____________________________________________________________  Should you have questions after your visit to [facility], please contact our office at [phone] and follow the prompts.  Our office hours are 8:00 a.m. and 4:30 p.m. Monday - Friday.  Please note that voicemails left after 4:00 p.m. may not be returned until the following business day.  We are closed weekends and major holidays.  You do have access to a nurse 24-7, just call the main number to the clinic [phone] and do not press any options, hold on the line and a nurse will answer the phone.    For prescription refill requests, have your pharmacy contact our office and allow 72  hours.
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
## 2022-04-09 DIAGNOSIS — J449 Chronic obstructive pulmonary disease, unspecified: Secondary | ICD-10-CM | POA: Diagnosis not present

## 2022-04-10 DIAGNOSIS — E1065 Type 1 diabetes mellitus with hyperglycemia: Secondary | ICD-10-CM | POA: Diagnosis not present

## 2022-04-13 DIAGNOSIS — I5022 Chronic systolic (congestive) heart failure: Secondary | ICD-10-CM | POA: Diagnosis not present

## 2022-04-13 DIAGNOSIS — I5032 Chronic diastolic (congestive) heart failure: Secondary | ICD-10-CM | POA: Diagnosis not present

## 2022-04-13 DIAGNOSIS — J9601 Acute respiratory failure with hypoxia: Secondary | ICD-10-CM | POA: Diagnosis not present

## 2022-04-15 ENCOUNTER — Other Ambulatory Visit (HOSPITAL_COMMUNITY): Payer: Self-pay | Admitting: Family Medicine

## 2022-04-15 ENCOUNTER — Ambulatory Visit (HOSPITAL_COMMUNITY)
Admission: RE | Admit: 2022-04-15 | Discharge: 2022-04-15 | Disposition: A | Discharge status: Home / Self Care | Payer: Medicare HMO | Source: Ambulatory Visit | Attending: Family Medicine | Admitting: Family Medicine

## 2022-04-15 ENCOUNTER — Other Ambulatory Visit: Payer: Self-pay | Admitting: Family Medicine

## 2022-04-15 DIAGNOSIS — I82542 Chronic embolism and thrombosis of left tibial vein: Secondary | ICD-10-CM | POA: Diagnosis not present

## 2022-04-15 DIAGNOSIS — R2242 Localized swelling, mass and lump, left lower limb: Secondary | ICD-10-CM | POA: Diagnosis not present

## 2022-04-15 DIAGNOSIS — I82512 Chronic embolism and thrombosis of left femoral vein: Secondary | ICD-10-CM | POA: Diagnosis not present

## 2022-04-15 DIAGNOSIS — I82409 Acute embolism and thrombosis of unspecified deep veins of unspecified lower extremity: Secondary | ICD-10-CM | POA: Diagnosis not present

## 2022-04-16 ENCOUNTER — Other Ambulatory Visit: Payer: Self-pay | Admitting: Cardiology

## 2022-05-01 ENCOUNTER — Encounter: Payer: Self-pay | Admitting: Cardiology

## 2022-05-01 ENCOUNTER — Inpatient Hospital Stay: Payer: Medicare HMO | Attending: Hematology | Admitting: Cardiology

## 2022-05-01 ENCOUNTER — Encounter: Payer: Self-pay | Admitting: *Deleted

## 2022-05-01 VITALS — BP 114/80 | HR 96 | Ht 72.0 in | Wt 208.6 lb

## 2022-05-01 DIAGNOSIS — I5022 Chronic systolic (congestive) heart failure: Secondary | ICD-10-CM | POA: Diagnosis not present

## 2022-05-01 DIAGNOSIS — I10 Essential (primary) hypertension: Secondary | ICD-10-CM

## 2022-05-01 DIAGNOSIS — I2510 Atherosclerotic heart disease of native coronary artery without angina pectoris: Secondary | ICD-10-CM | POA: Diagnosis not present

## 2022-05-01 NOTE — Patient Instructions (Signed)
Medication Instructions:  Continue all current medications.   Labwork: none  Testing/Procedures: none  Follow-Up: 6 months   Any Other Special Instructions Will Be Listed Below (If Applicable).   If you need a refill on your cardiac medications before your next appointment, please call your pharmacy.  
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
## 2022-05-06 ENCOUNTER — Telehealth: Payer: Self-pay

## 2022-05-06 ENCOUNTER — Ambulatory Visit (INDEPENDENT_AMBULATORY_CARE_PROVIDER_SITE_OTHER): Payer: Medicare HMO

## 2022-05-06 DIAGNOSIS — Z95810 Presence of automatic (implantable) cardiac defibrillator: Secondary | ICD-10-CM

## 2022-05-06 DIAGNOSIS — I5022 Chronic systolic (congestive) heart failure: Secondary | ICD-10-CM | POA: Diagnosis not present

## 2022-05-06 NOTE — Telephone Encounter (Signed)
Remote ICM transmission received.  Attempted call to patient regarding ICM remote transmission and left message per DPR to return call.   

## 2022-05-06 NOTE — Progress Notes (Signed)
EPIC Encounter for ICM Monitoring  Patient Name: Roger Lowe. is a 76 y.o. male Date: 05/06/2022 Primary Care Physican: [name], MD Primary Cardiologist: Branch Electrophysiologist: Lovena Le BiV Pacing: >99% 10/09/2021 Weight: 198 lbs 11/20/2021 Weight: 207 lbs 02/13/2022 Office Weight: 213 lbs  NVST: 8 episodes on 8/25 No therapies delivered SVT:  2 episodes  on 8/25 No therapies delivered           Attempted call to patient and unable to reach.  Left message to return call. Transmission reviewed.    CorVue thoracic impedance suggesting possible fluid accumulation starting 8/28.   Prescribed: Furosemide 40 mg take 1 tablet daily. Spironolactone 25 mg take 1 tablet daily Farxiga 5 mg take 1 tablet daily   Labs: 02/06/2022 Creatinine 1.32, BUN 25, Potassium 4.1, Sodium 138, GFR 57 12/13/2021 Creatinine 1.53, BUN 13, Potassium 5.3, Sodium 136, GFR 48 10/02/2021 Creatinine 1.42, BUN 19, Potassium 5.1, Sodium 133, GFR 52 A complete set of results can be found in Results Review.   Recommendations:  Unable to reach.      Follow-up plan: ICM clinic phone appointment on 05/13/2022 to recheck fluid levels.   91 day device clinic remote transmission 05/19/2022.     EP/Cardiology Office Visits:  11/12/2022 with Dr Harl Bowie.   Recall 02/15/2021 with Dr. Lovena Le.     Copy of ICM check sent to Dr. Lovena Le.  Will send to Dr Harl Bowie for review if patient is reached.  3 month ICM trend: 05/06/2022.    12-14 Month ICM trend:     [name], RN 05/06/2022 8:08 AM

## 2022-05-08 DIAGNOSIS — J449 Chronic obstructive pulmonary disease, unspecified: Secondary | ICD-10-CM | POA: Diagnosis not present

## 2022-05-10 DIAGNOSIS — E1065 Type 1 diabetes mellitus with hyperglycemia: Secondary | ICD-10-CM | POA: Diagnosis not present

## 2022-05-13 ENCOUNTER — Ambulatory Visit (INDEPENDENT_AMBULATORY_CARE_PROVIDER_SITE_OTHER): Payer: Medicare HMO

## 2022-05-13 DIAGNOSIS — Z95810 Presence of automatic (implantable) cardiac defibrillator: Secondary | ICD-10-CM

## 2022-05-13 DIAGNOSIS — I5022 Chronic systolic (congestive) heart failure: Secondary | ICD-10-CM

## 2022-05-13 NOTE — Progress Notes (Unsigned)
EPIC Encounter for ICM Monitoring  Patient Name: Roger Lowe. is a 76 y.o. male Date: 05/13/2022 Primary Care Physican: [name], MD Primary Cardiologist: Branch Electrophysiologist: Lovena Le BiV Pacing: >99% 10/09/2021 Weight: 198 lbs 11/20/2021 Weight: 207 lbs 02/13/2022 Office Weight: 213 lbs 05/13/2022 Weight: 213 lbs - 214 lbs   NVST: 8 episodes on 8/25 No therapies delivered SVT:  2 episodes  on 8/25 No therapies delivered           Spoke with patient and heart failure questions reviewed.  Pt reports blood clot in left leg.  He denies any fluid symptoms during decreased impedance. He does not follow low salt diet and aware he should limit daily salt intake.    CorVue thoracic impedance suggesting fluid levels returned close to base normal.  Possible fluid accumulation ranged from 8/28-9/9.   Prescribed: Furosemide 40 mg take 1 tablet daily. Spironolactone 25 mg take 1 tablet daily Farxiga 5 mg take 1 tablet daily   Labs: 02/06/2022 Creatinine 1.32, BUN 25, Potassium 4.1, Sodium 138, GFR 57 12/13/2021 Creatinine 1.53, BUN 13, Potassium 5.3, Sodium 136, GFR 48 10/02/2021 Creatinine 1.42, BUN 19, Potassium 5.1, Sodium 133, GFR 52 A complete set of results can be found in Results Review.   Recommendations:  Advised to limit salt intake.  No changes and encouraged to call if experiencing any fluid symptoms.     Follow-up plan: ICM clinic phone appointment on 05/20/2022 to recheck fluid levels.   91 day device clinic remote transmission 05/19/2022.     EP/Cardiology Office Visits:  11/12/2022 with Dr Harl Bowie.   Message sent to EP scheduler to call and make appointment with Dr Lovena Le.   Recall 02/15/2021 with Dr. Lovena Le.     Copy of ICM check sent to Dr. Lovena Le and Dr Harl Bowie for review and recommendations if needed.   3 month ICM trend: 05/13/2022.    12-14 Month ICM trend:     [name], RN 05/13/2022 7:48 AM

## 2022-05-14 ENCOUNTER — Institutional Professional Consult (permissible substitution): Payer: Medicare HMO | Admitting: Pulmonary Disease

## 2022-05-19 ENCOUNTER — Ambulatory Visit (INDEPENDENT_AMBULATORY_CARE_PROVIDER_SITE_OTHER): Payer: Medicare HMO

## 2022-05-19 DIAGNOSIS — I428 Other cardiomyopathies: Secondary | ICD-10-CM | POA: Diagnosis not present

## 2022-05-20 ENCOUNTER — Ambulatory Visit (INDEPENDENT_AMBULATORY_CARE_PROVIDER_SITE_OTHER): Payer: Medicare HMO

## 2022-05-20 DIAGNOSIS — Z95810 Presence of automatic (implantable) cardiac defibrillator: Secondary | ICD-10-CM

## 2022-05-20 DIAGNOSIS — I5022 Chronic systolic (congestive) heart failure: Secondary | ICD-10-CM

## 2022-05-20 NOTE — Progress Notes (Unsigned)
EPIC Encounter for ICM Monitoring  Patient Name: Roger Lowe. is a 76 y.o. male Date: 05/20/2022 Primary Care Physican: [name], MD Primary Cardiologist: Branch Electrophysiologist: Lovena Le BiV Pacing: >99% 10/09/2021 Weight: 198 lbs 11/20/2021 Weight: 207 lbs 02/13/2022 Office Weight: 213 lbs 05/13/2022 Weight: 213 lbs - 214 lbs   NVST: 8 episodes on 8/25 No therapies delivered SVT:  2 episodes  on 8/25 No therapies delivered           Spoke with patient and heart failure questions reviewed.  Pt asymptomatic for fluid accumulation.  Reports feeling well at this time and voices no complaints.    CorVue thoracic impedance suggesting fluid levels returned to normal.   Prescribed: Furosemide 40 mg take 1 tablet daily. Spironolactone 25 mg take 1 tablet daily Farxiga 5 mg take 1 tablet daily   Labs: 02/06/2022 Creatinine 1.32, BUN 25, Potassium 4.1, Sodium 138, GFR 57 12/13/2021 Creatinine 1.53, BUN 13, Potassium 5.3, Sodium 136, GFR 48 10/02/2021 Creatinine 1.42, BUN 19, Potassium 5.1, Sodium 133, GFR 52 A complete set of results can be found in Results Review.   Recommendations:  No changes and encouraged to call if experiencing any fluid symptoms.    Follow-up plan: ICM clinic phone appointment on 06/16/2022.   91 day device clinic remote transmission 08/18/2022.     EP/Cardiology Office Visits:  11/12/2022 with Dr Harl Bowie.   Message sent to EP scheduler to call pt to schedule overdue appointment with Dr Lovena Le.   Recall 02/15/2021 with Dr. Lovena Le.     Copy of ICM check sent to Dr. Lovena Le.  3 month ICM trend: 05/20/2022.    12-14 Month ICM trend:     [name], RN 05/20/2022 8:00 AM

## 2022-05-21 LAB — CUP PACEART REMOTE DEVICE CHECK
Battery Remaining Longevity: 65 mo
Battery Remaining Percentage: 70 %
Battery Voltage: 2.96 V
Brady Statistic AP VP Percent: 1 %
Brady Statistic AP VS Percent: 1 %
Brady Statistic AS VP Percent: 99 %
Brady Statistic AS VS Percent: 1 %
Brady Statistic RA Percent Paced: 1 %
Date Time Interrogation Session: 20230918183612
HighPow Impedance: 74 Ohm
Implantable Lead Implant Date: 20210308
Implantable Lead Implant Date: 20210308
Implantable Lead Implant Date: 20210308
Implantable Lead Location: 753858
Implantable Lead Location: 753859
Implantable Lead Location: 753860
Implantable Lead Model: 7122
Implantable Pulse Generator Implant Date: 20210308
Lead Channel Impedance Value: 410 Ohm
Lead Channel Impedance Value: 530 Ohm
Lead Channel Impedance Value: 940 Ohm
Lead Channel Pacing Threshold Amplitude: 0.625 V
Lead Channel Pacing Threshold Amplitude: 0.75 V
Lead Channel Pacing Threshold Amplitude: 1.25 V
Lead Channel Pacing Threshold Pulse Width: 0.5 ms
Lead Channel Pacing Threshold Pulse Width: 0.5 ms
Lead Channel Pacing Threshold Pulse Width: 0.5 ms
Lead Channel Sensing Intrinsic Amplitude: 11.8 mV
Lead Channel Sensing Intrinsic Amplitude: 5 mV
Lead Channel Setting Pacing Amplitude: 1.625
Lead Channel Setting Pacing Amplitude: 2 V
Lead Channel Setting Pacing Amplitude: 2.5 V
Lead Channel Setting Pacing Pulse Width: 0.5 ms
Lead Channel Setting Pacing Pulse Width: 0.5 ms
Lead Channel Setting Sensing Sensitivity: 0.5 mV
Pulse Gen Serial Number: 111019088

## 2022-05-26 ENCOUNTER — Ambulatory Visit (INDEPENDENT_AMBULATORY_CARE_PROVIDER_SITE_OTHER): Payer: Medicare HMO | Admitting: Pulmonary Disease

## 2022-05-26 ENCOUNTER — Encounter: Payer: Self-pay | Admitting: Pulmonary Disease

## 2022-05-26 VITALS — BP 140/90 | HR 104 | Ht 72.0 in | Wt 216.0 lb

## 2022-05-26 DIAGNOSIS — J449 Chronic obstructive pulmonary disease, unspecified: Secondary | ICD-10-CM | POA: Diagnosis not present

## 2022-05-26 DIAGNOSIS — R911 Solitary pulmonary nodule: Secondary | ICD-10-CM

## 2022-05-26 DIAGNOSIS — Z2239 Carrier of other specified bacterial diseases: Secondary | ICD-10-CM | POA: Diagnosis not present

## 2022-05-26 DIAGNOSIS — Z85118 Personal history of other malignant neoplasm of bronchus and lung: Secondary | ICD-10-CM
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
## 2022-05-26 NOTE — Patient Instructions (Signed)
Thank you for visiting Dr. Valeta Harms at [facility]. Today we recommend the following:  Orders Placed This Encounter  Procedures   CT Super D Chest Wo Contrast   Follow up with [place] after your ct chest in Dec 2023  Return in about 3 months (around 08/25/2022) for with Eric Form, NP, or Dr. Valeta Harms.    Please do your part to reduce the spread of COVID-19.

## 2022-05-30 DIAGNOSIS — J449 Chronic obstructive pulmonary disease, unspecified: Secondary | ICD-10-CM | POA: Diagnosis not present

## 2022-05-30 NOTE — Progress Notes (Signed)
Remote ICD transmission.   

## 2022-06-09 DIAGNOSIS — E1065 Type 1 diabetes mellitus with hyperglycemia: Secondary | ICD-10-CM | POA: Diagnosis not present

## 2022-06-16 ENCOUNTER — Telehealth: Payer: Self-pay

## 2022-06-16 ENCOUNTER — Ambulatory Visit (INDEPENDENT_AMBULATORY_CARE_PROVIDER_SITE_OTHER): Payer: Medicare HMO

## 2022-06-16 DIAGNOSIS — I5022 Chronic systolic (congestive) heart failure: Secondary | ICD-10-CM

## 2022-06-16 DIAGNOSIS — Z95810 Presence of automatic (implantable) cardiac defibrillator: Secondary | ICD-10-CM

## 2022-06-16 NOTE — Telephone Encounter (Signed)
Remote ICM transmission received.  Attempted call to patient regarding ICM remote transmission and left detailed message per DPR.  Advised to return call for any fluid symptoms or questions. Next ICM remote transmission scheduled 06/23/2022.

## 2022-06-16 NOTE — Progress Notes (Signed)
EPIC Encounter for ICM Monitoring  Patient Name: Roger Lowe. is a 76 y.o. male Date: 06/16/2022 Primary Care Physican: [name], MD Primary Cardiologist: Branch Electrophysiologist: Lovena Le BiV Pacing: >99% 10/09/2021 Weight: 198 lbs 11/20/2021 Weight: 207 lbs 02/13/2022 Office Weight: 213 lbs 05/13/2022 Weight: 213 lbs - 214 lbs            Attempted call to patient and unable to reach.  Left detailed message per DPR regarding transmission. Transmission reviewed.    CorVue thoracic impedance suggesting possible fluid accumulation starting 10/12.   Prescribed: Furosemide 40 mg take 1 tablet daily. Spironolactone 25 mg take 1 tablet daily Farxiga 5 mg take 1 tablet daily   Labs: 02/06/2022 Creatinine 1.32, BUN 25, Potassium 4.1, Sodium 138, GFR 57 12/13/2021 Creatinine 1.53, BUN 13, Potassium 5.3, Sodium 136, GFR 48 10/02/2021 Creatinine 1.42, BUN 19, Potassium 5.1, Sodium 133, GFR 52 A complete set of results can be found in Results Review.   Recommendations:  Left voice mail with ICM number and encouraged to call if experiencing any fluid symptoms.   Follow-up plan: ICM clinic phone appointment on 06/23/2022 to recheck fluid levels.   91 day device clinic remote transmission 08/18/2022.     EP/Cardiology Office Visits:  11/12/2022 with Dr Harl Bowie.   07/03/2022 with Dr. Lovena Le.     Copy of ICM check sent to Dr. Lovena Le.  Will send copy to Dr Harl Bowie if patient is reached.   3 month ICM trend: 06/16/2022.    12-14 Month ICM trend:     [name], RN 06/16/2022 4:16 PM

## 2022-06-23 ENCOUNTER — Ambulatory Visit (INDEPENDENT_AMBULATORY_CARE_PROVIDER_SITE_OTHER): Payer: Medicare HMO

## 2022-06-23 DIAGNOSIS — Z95810 Presence of automatic (implantable) cardiac defibrillator: Secondary | ICD-10-CM

## 2022-06-23 DIAGNOSIS — I5022 Chronic systolic (congestive) heart failure: Secondary | ICD-10-CM

## 2022-06-23 DIAGNOSIS — J449 Chronic obstructive pulmonary disease, unspecified: Secondary | ICD-10-CM | POA: Diagnosis not present

## 2022-06-23 NOTE — Progress Notes (Signed)
EPIC Encounter for ICM Monitoring  Patient Name: Roger Lowe. is a 76 y.o. male Date: 06/23/2022 Primary Care Physican: [name], MD Primary Cardiologist: Branch Electrophysiologist: Lovena Le BiV Pacing: >99% 10/09/2021 Weight: 198 lbs 11/20/2021 Weight: 207 lbs 02/13/2022 Office Weight: 213 lbs 05/13/2022 Weight: 213 lbs - 214 lbs            Transmission reviewed.    CorVue thoracic impedance suggesting fluid levels returned to normal.   Prescribed: Furosemide 40 mg take 1 tablet daily. Spironolactone 25 mg take 1 tablet daily Farxiga 5 mg take 1 tablet daily   Labs: 02/06/2022 Creatinine 1.32, BUN 25, Potassium 4.1, Sodium 138, GFR 57 12/13/2021 Creatinine 1.53, BUN 13, Potassium 5.3, Sodium 136, GFR 48 10/02/2021 Creatinine 1.42, BUN 19, Potassium 5.1, Sodium 133, GFR 52 A complete set of results can be found in Results Review.   Recommendations:  No changes.     Follow-up plan: ICM clinic phone appointment on 07/21/2022.   91 day device clinic remote transmission 08/18/2022.     EP/Cardiology Office Visits:  11/12/2022 with Dr Harl Bowie.   07/03/2022 with Dr. Lovena Le.     Copy of ICM check sent to Dr. Lovena Le.   3 month ICM trend: 06/23/2022.    12-14 Month ICM trend:     [name], RN 06/23/2022 3:17 PM

## 2022-07-03 ENCOUNTER — Inpatient Hospital Stay: Payer: Medicare HMO | Attending: Internal Medicine | Admitting: Internal Medicine

## 2022-07-03 VITALS — BP 138/108 | HR 101 | Ht 72.0 in | Wt 215.8 lb

## 2022-07-03 DIAGNOSIS — E782 Mixed hyperlipidemia: Secondary | ICD-10-CM | POA: Diagnosis not present

## 2022-07-03 DIAGNOSIS — I5022 Chronic systolic (congestive) heart failure: Secondary | ICD-10-CM

## 2022-07-03 DIAGNOSIS — E1165 Type 2 diabetes mellitus with hyperglycemia: Secondary | ICD-10-CM | POA: Diagnosis not present

## 2022-07-03 DIAGNOSIS — I10 Essential (primary) hypertension: Secondary | ICD-10-CM

## 2022-07-03 DIAGNOSIS — M109 Gout, unspecified: Secondary | ICD-10-CM | POA: Diagnosis not present

## 2022-07-03 NOTE — Patient Instructions (Signed)
Medication Instructions:  Your physician recommends that you continue on your current medications as directed. Please refer to the Current Medication list given to you today.  *If you need a refill on your cardiac medications before your next appointment, please call your pharmacy*   Lab Work: NONE   If you have labs (blood work) drawn today and your tests are completely normal, you will receive your results only by: MyChart Message (if you have MyChart) OR A paper copy in the mail If you have any lab test that is abnormal or we need to change your treatment, we will call you to review the results.   Testing/Procedures: NONE    Follow-Up: At [facility], you and your health needs are our priority.  As part of our continuing mission to provide you with exceptional heart care, we have created designated Provider Care Teams.  These Care Teams include your primary Cardiologist (physician) and Advanced Practice Providers (APPs -  Physician Assistants and Nurse Practitioners) who all work together to provide you with the care you need, when you need it.  We recommend signing up for the patient portal called "MyChart".  Sign up information is provided on this After Visit Summary.  MyChart is used to connect with patients for Virtual Visits (Telemedicine).  Patients are able to view lab/test results, encounter notes, upcoming appointments, etc.  Non-urgent messages can be sent to your provider as well.   To learn more about what you can do with MyChart, go to [email or web address].    Your next appointment:   1 year(s)  The format for your next appointment:   In Person  Provider:   Gregg Taylor, MD    Other Instructions Thank you for choosing [facility]!    Important Information About Sugar       

## 2022-07-03 NOTE — Progress Notes (Signed)
EKG complete.
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
## 2022-07-07 DIAGNOSIS — D696 Thrombocytopenia, unspecified: Secondary | ICD-10-CM | POA: Diagnosis not present

## 2022-07-07 DIAGNOSIS — E782 Mixed hyperlipidemia: Secondary | ICD-10-CM | POA: Diagnosis not present

## 2022-07-07 DIAGNOSIS — C3490 Malignant neoplasm of unspecified part of unspecified bronchus or lung: Secondary | ICD-10-CM | POA: Diagnosis not present

## 2022-07-07 DIAGNOSIS — E1165 Type 2 diabetes mellitus with hyperglycemia: Secondary | ICD-10-CM | POA: Diagnosis not present

## 2022-07-07 DIAGNOSIS — K219 Gastro-esophageal reflux disease without esophagitis: Secondary | ICD-10-CM | POA: Diagnosis not present

## 2022-07-07 DIAGNOSIS — N1831 Chronic kidney disease, stage 3a: Secondary | ICD-10-CM | POA: Diagnosis not present

## 2022-07-07 DIAGNOSIS — I5022 Chronic systolic (congestive) heart failure: Secondary | ICD-10-CM | POA: Diagnosis not present

## 2022-07-07 DIAGNOSIS — I2510 Atherosclerotic heart disease of native coronary artery without angina pectoris: Secondary | ICD-10-CM | POA: Diagnosis not present

## 2022-07-07 DIAGNOSIS — J449 Chronic obstructive pulmonary disease, unspecified: Secondary | ICD-10-CM | POA: Diagnosis not present

## 2022-07-08 DIAGNOSIS — M25552 Pain in left hip: Secondary | ICD-10-CM | POA: Diagnosis not present

## 2022-07-09 DIAGNOSIS — E1065 Type 1 diabetes mellitus with hyperglycemia: Secondary | ICD-10-CM | POA: Diagnosis not present

## 2022-07-14 ENCOUNTER — Encounter: Payer: Self-pay | Admitting: Internal Medicine

## 2022-07-16 ENCOUNTER — Other Ambulatory Visit: Payer: Self-pay | Admitting: Pulmonary Disease

## 2022-07-17 DIAGNOSIS — J449 Chronic obstructive pulmonary disease, unspecified: Secondary | ICD-10-CM | POA: Diagnosis not present

## 2022-07-21 ENCOUNTER — Ambulatory Visit (INDEPENDENT_AMBULATORY_CARE_PROVIDER_SITE_OTHER): Payer: Medicare HMO

## 2022-07-21 ENCOUNTER — Telehealth: Payer: Self-pay

## 2022-07-21 DIAGNOSIS — Z95810 Presence of automatic (implantable) cardiac defibrillator: Secondary | ICD-10-CM

## 2022-07-21 DIAGNOSIS — I5022 Chronic systolic (congestive) heart failure: Secondary | ICD-10-CM | POA: Diagnosis not present

## 2022-07-21 NOTE — Telephone Encounter (Signed)
Remote ICM transmission received.  Attempted call to patient regarding ICM remote transmission and no answer.  Mail box is full.

## 2022-07-21 NOTE — Progress Notes (Signed)
EPIC Encounter for ICM Monitoring  Patient Name: Roger Lowe. is a 76 y.o. male Date: 07/21/2022 Primary Care Physican: [name], MD Primary Cardiologist: Branch Electrophysiologist: Lovena Le BiV Pacing: >99% 10/09/2021 Weight: 198 lbs 11/20/2021 Weight: 207 lbs 02/13/2022 Office Weight: 213 lbs 05/13/2022 Weight: 213 lbs - 214 lbs         Attempted call to patient and unable to reach.   Transmission reviewed.    CorVue thoracic impedance suggesting possible fluid accumulation starting 11/18 but trending close to baseline.  Also suggesting possible fluid accumulation from 11/4-11/17.   Prescribed: Furosemide 40 mg take 1 tablet daily. Spironolactone 25 mg take 1 tablet daily Farxiga 5 mg take 1 tablet daily   Labs: 02/06/2022 Creatinine 1.32, BUN 25, Potassium 4.1, Sodium 138, GFR 57 12/13/2021 Creatinine 1.53, BUN 13, Potassium 5.3, Sodium 136, GFR 48 10/02/2021 Creatinine 1.42, BUN 19, Potassium 5.1, Sodium 133, GFR 52 A complete set of results can be found in Results Review.   Recommendations:  Unable to reach.     Follow-up plan: ICM clinic phone appointment on 08/11/2022 to recheck fluid levels.   91 day device clinic remote transmission 08/18/2022.     EP/Cardiology Office Visits:  11/12/2022 with Dr Harl Bowie.   Recall 07/03/2023 with Dr. Lovena Le.     Copy of ICM check sent to Dr. Lovena Le.  Will send to Dr Harl Bowie for review if patient is reached.   3 month ICM trend: 07/21/2022.    12-14 Month ICM trend:     [name], RN 07/21/2022 3:39 PM

## 2022-08-05 ENCOUNTER — Ambulatory Visit (HOSPITAL_COMMUNITY)
Admission: RE | Admit: 2022-08-05 | Discharge: 2022-08-05 | Disposition: A | Discharge status: Home / Self Care | Payer: Medicare HMO | Source: Ambulatory Visit | Attending: Pulmonary Disease | Admitting: Pulmonary Disease

## 2022-08-05 ENCOUNTER — Encounter: Payer: Self-pay | Admitting: *Deleted

## 2022-08-05 ENCOUNTER — Telehealth: Payer: Self-pay | Admitting: *Deleted

## 2022-08-05 DIAGNOSIS — J439 Emphysema, unspecified: Secondary | ICD-10-CM | POA: Diagnosis not present

## 2022-08-05 DIAGNOSIS — J479 Bronchiectasis, uncomplicated: Secondary | ICD-10-CM | POA: Diagnosis not present

## 2022-08-05 DIAGNOSIS — J449 Chronic obstructive pulmonary disease, unspecified: Secondary | ICD-10-CM | POA: Diagnosis not present

## 2022-08-05 DIAGNOSIS — Z85118 Personal history of other malignant neoplasm of bronchus and lung: Secondary | ICD-10-CM | POA: Diagnosis not present

## 2022-08-05 DIAGNOSIS — R911 Solitary pulmonary nodule: Secondary | ICD-10-CM | POA: Diagnosis not present

## 2022-08-05 DIAGNOSIS — Z2239 Carrier of other specified bacterial diseases: Secondary | ICD-10-CM | POA: Diagnosis not present
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
## 2022-08-07 DIAGNOSIS — J449 Chronic obstructive pulmonary disease, unspecified: Secondary | ICD-10-CM | POA: Diagnosis not present

## 2022-08-10 ENCOUNTER — Other Ambulatory Visit: Payer: Self-pay | Admitting: Pulmonary Disease

## 2022-08-11 ENCOUNTER — Ambulatory Visit (INDEPENDENT_AMBULATORY_CARE_PROVIDER_SITE_OTHER): Payer: Medicare HMO

## 2022-08-11 DIAGNOSIS — Z95810 Presence of automatic (implantable) cardiac defibrillator: Secondary | ICD-10-CM

## 2022-08-11 DIAGNOSIS — I5022 Chronic systolic (congestive) heart failure: Secondary | ICD-10-CM
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
## 2022-08-12 ENCOUNTER — Ambulatory Visit: Payer: Medicare HMO | Admitting: Urology

## 2022-08-12 NOTE — Progress Notes (Signed)
EPIC Encounter for ICM Monitoring  Patient Name: Roger Lowe. is a 76 y.o. male Date: 08/12/2022 Primary Care Physican: [name], MD Primary Cardiologist: Branch Electrophysiologist: Lovena Le BiV Pacing: >99% 10/09/2021 Weight: 198 lbs 11/20/2021 Weight: 207 lbs 02/13/2022 Office Weight: 213 lbs 05/13/2022 Weight: 213 lbs - 214 lbs         Spoke with patient and heart failure questions reviewed.  Transmission results reviewed.  Pt asymptomatic for fluid accumulation.  Reports feeling well at this time and voices no complaints.   He is going to [place] for Christmas.    CorVue thoracic impedance suggesting fluid levels returned normal.   Prescribed: Furosemide 40 mg take 1 tablet daily. Spironolactone 25 mg take 1 tablet daily Farxiga 5 mg take 1 tablet daily   Labs: 02/06/2022 Creatinine 1.32, BUN 25, Potassium 4.1, Sodium 138, GFR 57 12/13/2021 Creatinine 1.53, BUN 13, Potassium 5.3, Sodium 136, GFR 48 10/02/2021 Creatinine 1.42, BUN 19, Potassium 5.1, Sodium 133, GFR 52 A complete set of results can be found in Results Review.   Recommendations:  No changes and encouraged to call if experiencing any fluid symptoms.   Follow-up plan: ICM clinic phone appointment on 09/22/2022   91 day device clinic remote transmission 08/18/2022.     EP/Cardiology Office Visits:  11/12/2022 with Dr Harl Bowie.   Recall 07/03/2023 with Dr. Lovena Le.     Copy of ICM check sent to Dr. Lovena Le.  3 month ICM trend: 08/11/2022.    12-14 Month ICM trend:     [name], RN 08/12/2022 1:50 PM

## 2022-08-14 ENCOUNTER — Encounter: Payer: Self-pay | Admitting: Pulmonary Disease

## 2022-08-14 ENCOUNTER — Ambulatory Visit (INDEPENDENT_AMBULATORY_CARE_PROVIDER_SITE_OTHER): Payer: Medicare HMO | Admitting: Pulmonary Disease

## 2022-08-14 VITALS — BP 120/80 | HR 84 | Ht 72.0 in | Wt 215.0 lb

## 2022-08-14 DIAGNOSIS — Z2239 Carrier of other specified bacterial diseases: Secondary | ICD-10-CM | POA: Diagnosis not present

## 2022-08-14 DIAGNOSIS — J449 Chronic obstructive pulmonary disease, unspecified: Secondary | ICD-10-CM | POA: Diagnosis not present

## 2022-08-14 DIAGNOSIS — R911 Solitary pulmonary nodule: Secondary | ICD-10-CM | POA: Diagnosis not present

## 2022-08-14 DIAGNOSIS — Z85118 Personal history of other malignant neoplasm of bronchus and lung: Secondary | ICD-10-CM

## 2022-08-14 DIAGNOSIS — Z87891 Personal history of nicotine dependence: Secondary | ICD-10-CM
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
## 2022-08-14 NOTE — Patient Instructions (Addendum)
Thank you for visiting Dr. Valeta Harms at [facility]. Today we recommend the following:  Orders Placed This Encounter  Procedures   Procedural/ Surgical Case Request: ROBOTIC ASSISTED NAVIGATIONAL BRONCHOSCOPY   Ambulatory referral to Pulmonology   Bronchoscopy on 09/02/2022 Stop XARELTO on 08/30/2022  Return in about 26 days (around 09/09/2022) for with Eric Form, NP.    Please do your part to reduce the spread of COVID-19.

## 2022-08-18 ENCOUNTER — Ambulatory Visit: Payer: Medicare HMO | Attending: Internal Medicine

## 2022-08-18 DIAGNOSIS — I5022 Chronic systolic (congestive) heart failure: Secondary | ICD-10-CM

## 2022-08-19 LAB — CUP PACEART REMOTE DEVICE CHECK
Battery Remaining Longevity: 62 mo
Battery Remaining Percentage: 67 %
Battery Voltage: 2.96 V
Brady Statistic AP VP Percent: 1 %
Brady Statistic AP VS Percent: 1 %
Brady Statistic AS VP Percent: 99 %
Brady Statistic AS VS Percent: 1 %
Brady Statistic RA Percent Paced: 1 %
Date Time Interrogation Session: 20231218030212
HighPow Impedance: 74 Ohm
Implantable Lead Connection Status: 753985
Implantable Lead Connection Status: 753985
Implantable Lead Connection Status: 753985
Implantable Lead Implant Date: 20210308
Implantable Lead Implant Date: 20210308
Implantable Lead Implant Date: 20210308
Implantable Lead Location: 753858
Implantable Lead Location: 753859
Implantable Lead Location: 753860
Implantable Lead Model: 7122
Implantable Pulse Generator Implant Date: 20210308
Lead Channel Impedance Value: 390 Ohm
Lead Channel Impedance Value: 550 Ohm
Lead Channel Impedance Value: 850 Ohm
Lead Channel Pacing Threshold Amplitude: 0.625 V
Lead Channel Pacing Threshold Amplitude: 0.75 V
Lead Channel Pacing Threshold Amplitude: 1.25 V
Lead Channel Pacing Threshold Pulse Width: 0.5 ms
Lead Channel Pacing Threshold Pulse Width: 0.5 ms
Lead Channel Pacing Threshold Pulse Width: 0.5 ms
Lead Channel Sensing Intrinsic Amplitude: 11.8 mV
Lead Channel Sensing Intrinsic Amplitude: 4.1 mV
Lead Channel Setting Pacing Amplitude: 1.625
Lead Channel Setting Pacing Amplitude: 2 V
Lead Channel Setting Pacing Amplitude: 2.5 V
Lead Channel Setting Pacing Pulse Width: 0.5 ms
Lead Channel Setting Pacing Pulse Width: 0.5 ms
Lead Channel Setting Sensing Sensitivity: 0.5 mV
Pulse Gen Serial Number: 111019088
Zone Setting Status: 755011

## 2022-08-28 DIAGNOSIS — J449 Chronic obstructive pulmonary disease, unspecified: Secondary | ICD-10-CM | POA: Diagnosis not present

## 2022-08-29 ENCOUNTER — Encounter: Payer: Self-pay | Admitting: Internal Medicine

## 2022-08-29 ENCOUNTER — Encounter (HOSPITAL_COMMUNITY): Payer: Self-pay | Admitting: Pulmonary Disease

## 2022-08-29 ENCOUNTER — Other Ambulatory Visit: Payer: Self-pay

## 2022-08-29 NOTE — Anesthesia Preprocedure Evaluation (Addendum)
Anesthesia Evaluation  Patient identified by MRN, date of birth, ID band Patient awake    Reviewed: Allergy & Precautions, NPO status , Patient's Chart, lab work & pertinent test results  Airway Mallampati: I  TM Distance: >3 FB Neck ROM: Full    Dental  (+) Edentulous Upper, Edentulous Lower   Pulmonary asthma , COPD,  COPD inhaler, Patient abstained from smoking., former smoker    + decreased breath sounds      Cardiovascular hypertension, Pt. on home beta blockers and Pt. on medications + CAD and +CHF  + dysrhythmias + Cardiac Defibrillator  Rhythm:Regular Rate:Normal     Neuro/Psych  Headaches CVA, Residual Symptoms  negative psych ROS   GI/Hepatic Neg liver ROS,GERD  Medicated,,  Endo/Other  diabetes, Type 2, Insulin Dependent    Renal/GU Renal disease     Musculoskeletal   Abdominal   Peds  Hematology   Anesthesia Other Findings   Reproductive/Obstetrics                             Anesthesia Physical Anesthesia Plan  ASA: 3  Anesthesia Plan: General   Post-op Pain Management: Minimal or no pain anticipated   Induction: Intravenous  PONV Risk Score and Plan: 1 and TIVA  Airway Management Planned: Oral ETT  Additional Equipment: None  Intra-op Plan:   Post-operative Plan: Extubation in OR  Informed Consent: I have reviewed the patients History and Physical, chart, labs and discussed the procedure including the risks, benefits and alternatives for the proposed anesthesia with the patient or authorized representative who has indicated his/her understanding and acceptance.     Dental advisory given  Plan Discussed with: CRNA  Anesthesia Plan Comments: (PAT note by Antionette Poles, PA-C: Follows with cardiology for history of nonobstructive CAD, and ICM (normalization of EF to 55-60% by echo 08/2020), DVT/PE on Xarelto, HTN.LVEF 20-25% in 2020, by 08/2020 echo had  normalized to 55-60%. He had biventricular AICD placed March 2021. Last seen by EP cardiologist Dr. Ladona Ridgel 07/03/2022, stable at that time, ICD interrogated and working normally.  No changes to management.    Follows with neurology for history of lung cancer s/p left-sided resection, severe COPD, chronic hypoxemic respiratory failure (on continuous O2 2 to 4 L).He is currently on triple therapy inhaler regimen also azithromycin Monday Wednesday Friday. He has hypermetabolic enlarging left lower lobe nodule.  It was previously biopsied 07/2021 negative for malignancy.  However, the nodule continues to enlarge. Seen by pulmonologist Dr. Tonia Brooms 08/14/2022. Continue concern for malignancy.  Repeat biopsy recommended.  Dr. Tonia Brooms instructed the patient to hold Xarelto 08/30/2022.  IDDM2, last A1c 7.8 on 05/07/21.  Pt will need DOS labs and eval.  EKG 07/03/22:Ventricular pacemaker. Rate 101.  CT super D chest 08/05/22: IMPRESSION: 1. Imaging for bronchoscopy planning and guidance. 2. The solid nodule anteriorly in the left lower lobe has slightly enlarged from the previous study, now measuring up to 1.7 cm in diameter. This remains suspicious for bronchogenic carcinoma. 3. No evidence of metastatic disease or other suspicious pulmonary nodule. 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 4. Aortic Atherosclerosis (ICD10-I70.0) and Emphysema (ICD10-J43.9).  TTE 08/17/20: 1. Left ventricular ejection fraction, by estimation, is 55 to 60%. The  left ventricle has normal function. The left ventricle has no regional  wall motion abnormalities. There is mild left ventricular hypertrophy.  Left ventricular diastolic parameters  are indeterminate.  2. Right ventricular systolic function is  normal. The right ventricular  size is normal.  3. The mitral valve is normal in structure. Trivial mitral valve  regurgitation. No evidence of mitral stenosis.  4. The aortic  valve is tricuspid. There is mild calcification of the  aortic valve. There is mild thickening of the aortic valve. Aortic valve  regurgitation is not visualized. No aortic stenosis is present.  5. The inferior vena cava is normal in size with greater than 50%  respiratory variability, suggesting right atrial pressure of 3 mmHg.   R/L Cath 11/08/18: ? Mid LM lesion is 10% stenosed. ? Prox Cx to Mid Cx lesion is 25% stenosed. ? Prox LAD lesion is 25% stenosed. ? There is moderate left ventricular systolic dysfunction. ? There is no aortic valve stenosis. ? Hemodynamic findings consistent with mild pulmonary hypertension. ? Ao sat 98%, PA sat 72%, PA mean 33 mm Hg, PCWP mean 31 mm Hg; CO 5.99 L/min; CI 2.63  Nonobstructive CAD.  Continue medical therapy for nonischemic cardiomyopathy.  )        Anesthesia Quick Evaluation

## 2022-08-29 NOTE — Progress Notes (Signed)
Spoke with pt for pre-op call. Pt has an extensive cardiac history. Pt has an ICD. Device orders requested and orders received. Have paged [employer] Rep. Elmyra Ricks, rep for [employer] returned call and no rep is needed. Magnet needs to be available.  Pt denies any recent chest pain, but states he's short of breath most all the time. Pt uses O2 at 4 Liters most of the day and night. Pt is diabetic. Last A1c was 8.1 a month ago per pt. Instructed pt to stop his Wilder Glade as of today. Instructed pt to take 1//2 of his regular dose of Levermir Monday PM and Tuesday AM, he will take 25 units both times. Instructed pt to check his blood sugar when he wakes up in the AM on Tuesday. If blood sugar is >220 take 1/2 of usual correction dose of Novolog insulin. If blood sugar is 70 or below, treat with 1/2 cup of clear juice (apple or cranberry) and recheck blood sugar 15 minutes after drinking juice. If blood sugar continues to be 70 or below, call the Short Stay department and ask to speak to a nurse.  Per instructions from Dr. Valeta Harms, pt's last dose of Xarelto is Saturday. 08/30/22.  Shower instructions given to pt and he voiced understanding.

## 2022-08-29 NOTE — Progress Notes (Signed)
Anesthesia Chart Review: Same day workup  Follows with cardiology for history of nonobstructive CAD, and ICM (normalization of EF to 55-60% by echo 08/2020), DVT/PE on Xarelto, HTN. LVEF 20-25% in 2020, by 08/2020 echo had normalized to 55-60%.  He had biventricular AICD placed March 2021.  Last seen by EP cardiologist Dr. Lovena Le 07/03/2022, stable at that time, ICD interrogated and working normally.  No changes to management.    Follows with neurology for history of lung cancer s/p left-sided resection, severe COPD, chronic hypoxemic respiratory failure (on continuous O2 2 to 4 L). He is currently on triple therapy inhaler regimen also azithromycin Monday Wednesday Friday.  He has hypermetabolic enlarging left lower lobe nodule.  It was previously biopsied 07/2021 negative for malignancy.  However, the nodule continues to enlarge.  Seen by pulmonologist Dr. Valeta Harms 08/14/2022.  Continue concern for malignancy.  Repeat biopsy recommended.  Dr. Valeta Harms instructed the patient to hold Xarelto 08/30/2022.  IDDM2, last A1c 7.8 on 05/07/21.   Pt will need DOS labs and eval.   EKG 07/03/22: Ventricular pacemaker. Rate 101.    CT super D chest 08/05/22: IMPRESSION: 1. Imaging for bronchoscopy planning and guidance. 2. The solid nodule anteriorly in the left lower lobe has slightly enlarged from the previous study, now measuring up to 1.7 cm in diameter. This remains suspicious for bronchogenic carcinoma. 3. No evidence of metastatic disease or other suspicious pulmonary nodule. 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 4. Aortic Atherosclerosis (ICD10-I70.0) and Emphysema (ICD10-J43.9).   TTE 08/17/20:  1. Left ventricular ejection fraction, by estimation, is 55 to 60%. The  left ventricle has normal function. The left ventricle has no regional  wall motion abnormalities. There is mild left ventricular hypertrophy.  Left ventricular diastolic parameters  are indeterminate.    2. Right ventricular systolic function is normal. The right ventricular  size is normal.   3. The mitral valve is normal in structure. Trivial mitral valve  regurgitation. No evidence of mitral stenosis.   4. The aortic valve is tricuspid. There is mild calcification of the  aortic valve. There is mild thickening of the aortic valve. Aortic valve  regurgitation is not visualized. No aortic stenosis is present.   5. The inferior vena cava is normal in size with greater than 50%  respiratory variability, suggesting right atrial pressure of 3 mmHg.    R/L Cath 11/08/18: Mid LM lesion is 10% stenosed. Prox Cx to Mid Cx lesion is 25% stenosed. Prox LAD lesion is 25% stenosed. There is moderate left ventricular systolic dysfunction. There is no aortic valve stenosis. Hemodynamic findings consistent with mild pulmonary hypertension. Ao sat 98%, PA sat 72%, PA mean 33 mm Hg, PCWP mean 31 mm Hg; CO 5.99 L/min; CI 2.63   Nonobstructive CAD.   Continue medical therapy for nonischemic cardiomyopathy.      Wynonia Musty [facility] Short Stay Center/Anesthesiology Phone [phone] 08/29/2022 10:29 AM

## 2022-08-29 NOTE — Progress Notes (Signed)
[facility] DEVICE PROGRAMMING  Patient Information: Name:  Ida Uppal.  DOB:  06/23/1946  MRN:  [id number]    Planned Procedure:  Video Bronchoscopy  Surgeon:  Dr. Valeta Harms  Date of Procedure:  09/02/22  Cautery will be used.  Position during surgery:  supine   Please send documentation back to:  [facility] (Fax # [phone])   Device Information:  Clinic EP Physician:  Cristopher Peru, MD   Device Type:  Pacemaker and Defibrillator Manufacturer and Phone #:  St. Jude/Abbott: [phone] Pacemaker Dependent?:  No. Date of Last Device Check:  08/19/22 Normal Device Function?:  Yes.    Electrophysiologist's Recommendations:  Have magnet available. Provide continuous ECG monitoring when magnet is used or reprogramming is to be performed.  Procedure may interfere with device function.  Magnet should be placed over device during procedure.  Per Device Clinic Standing Orders, Wanda Plump, RN  1:10 PM 08/29/2022

## 2022-09-02 ENCOUNTER — Ambulatory Visit (HOSPITAL_BASED_OUTPATIENT_CLINIC_OR_DEPARTMENT_OTHER): Payer: Medicare HMO | Admitting: Physician Assistant

## 2022-09-02 ENCOUNTER — Ambulatory Visit (HOSPITAL_COMMUNITY): Payer: Medicare HMO | Admitting: Physician Assistant

## 2022-09-02 ENCOUNTER — Ambulatory Visit (HOSPITAL_COMMUNITY): Payer: Medicare HMO

## 2022-09-02 ENCOUNTER — Ambulatory Visit (HOSPITAL_COMMUNITY)
Admission: RE | Admit: 2022-09-02 | Discharge: 2022-09-02 | Disposition: A | Discharge status: Home / Self Care | Payer: Medicare HMO | Attending: Pulmonary Disease | Admitting: Pulmonary Disease

## 2022-09-02 ENCOUNTER — Encounter (HOSPITAL_COMMUNITY)
Admission: RE | Disposition: A | Discharge status: Home / Self Care | Payer: Self-pay | Source: Home / Self Care | Attending: Pulmonary Disease

## 2022-09-02 DIAGNOSIS — Z1152 Encounter for screening for COVID-19: Secondary | ICD-10-CM | POA: Diagnosis not present

## 2022-09-02 DIAGNOSIS — R519 Headache, unspecified: Secondary | ICD-10-CM | POA: Diagnosis not present

## 2022-09-02 DIAGNOSIS — I509 Heart failure, unspecified: Secondary | ICD-10-CM

## 2022-09-02 DIAGNOSIS — I110 Hypertensive heart disease with heart failure: Secondary | ICD-10-CM | POA: Insufficient documentation

## 2022-09-02 DIAGNOSIS — C3432 Malignant neoplasm of lower lobe, left bronchus or lung: Secondary | ICD-10-CM | POA: Diagnosis not present

## 2022-09-02 DIAGNOSIS — R911 Solitary pulmonary nodule: Secondary | ICD-10-CM | POA: Diagnosis not present

## 2022-09-02 DIAGNOSIS — Z794 Long term (current) use of insulin: Secondary | ICD-10-CM | POA: Diagnosis not present

## 2022-09-02 DIAGNOSIS — Z86711 Personal history of pulmonary embolism: Secondary | ICD-10-CM | POA: Diagnosis not present

## 2022-09-02 DIAGNOSIS — J439 Emphysema, unspecified: Secondary | ICD-10-CM | POA: Insufficient documentation

## 2022-09-02 DIAGNOSIS — I517 Cardiomegaly: Secondary | ICD-10-CM | POA: Insufficient documentation

## 2022-09-02 DIAGNOSIS — Z87891 Personal history of nicotine dependence: Secondary | ICD-10-CM | POA: Insufficient documentation

## 2022-09-02 DIAGNOSIS — I700 Atherosclerosis of aorta: Secondary | ICD-10-CM | POA: Insufficient documentation

## 2022-09-02 DIAGNOSIS — K219 Gastro-esophageal reflux disease without esophagitis: Secondary | ICD-10-CM | POA: Insufficient documentation

## 2022-09-02 DIAGNOSIS — I2510 Atherosclerotic heart disease of native coronary artery without angina pectoris: Secondary | ICD-10-CM | POA: Insufficient documentation

## 2022-09-02 DIAGNOSIS — E119 Type 2 diabetes mellitus without complications: Secondary | ICD-10-CM | POA: Diagnosis not present

## 2022-09-02 DIAGNOSIS — J479 Bronchiectasis, uncomplicated: Secondary | ICD-10-CM | POA: Insufficient documentation

## 2022-09-02 HISTORY — PX: BRONCHIAL BIOPSY: SHX5109

## 2022-09-02 HISTORY — DX: Chronic kidney disease, unspecified: N18.9

## 2022-09-02 HISTORY — DX: Headache, unspecified: R51.9

## 2022-09-02 HISTORY — PX: BRONCHIAL BRUSHINGS: SHX5108

## 2022-09-02 HISTORY — DX: Chronic obstructive pulmonary disease, unspecified: J44.9

## 2022-09-02 LAB — BASIC METABOLIC PANEL
Anion gap: 7 (ref 5–15)
BUN: 6 mg/dL — ABNORMAL LOW (ref 8–23)
CO2: 24 mmol/L (ref 22–32)
Calcium: 9.2 mg/dL (ref 8.9–10.3)
Chloride: 105 mmol/L (ref 98–111)
Creatinine, Ser: 1.28 mg/dL — ABNORMAL HIGH (ref 0.61–1.24)
GFR, Estimated: 59 mL/min — ABNORMAL LOW (ref >60)
Glucose, Bld: 126 mg/dL — ABNORMAL HIGH (ref 70–99)
Potassium: 4 mmol/L (ref 3.5–5.1)
Sodium: 136 mmol/L (ref 135–145)

## 2022-09-02 LAB — CBC
HCT: 46 % (ref 39.0–52.0)
Hemoglobin: 14.8 g/dL (ref 13.0–17.0)
MCH: 30.2 pg (ref 26.0–34.0)
MCHC: 32.2 g/dL (ref 30.0–36.0)
MCV: 93.9 fL (ref 80.0–100.0)
Platelets: 169 10*3/uL (ref 150–400)
RBC: 4.9 MIL/uL (ref 4.22–5.81)
RDW: 13.2 % (ref 11.5–15.5)
WBC: 8.6 10*3/uL (ref 4.0–10.5)
nRBC: 0 % (ref 0.0–0.2)

## 2022-09-02 LAB — GLUCOSE, CAPILLARY
Glucose-Capillary: 150 mg/dL — ABNORMAL HIGH (ref 70–99)
Glucose-Capillary: 134 mg/dL — ABNORMAL HIGH (ref 70–99)

## 2022-09-02 LAB — SARS CORONAVIRUS 2 BY RT PCR: SARS Coronavirus 2 by RT PCR: NEGATIVE

## 2022-09-02 SURGERY — BRONCHOSCOPY, WITH BIOPSY USING ELECTROMAGNETIC NAVIGATION
Anesthesia: General | Laterality: Left

## 2022-09-02 MED ORDER — ONDANSETRON HCL 4 MG/2ML IJ SOLN: 4.0000 mg | Freq: Once | INTRAMUSCULAR | Status: DC | PRN

## 2022-09-02 MED ORDER — INSULIN ASPART 100 UNIT/ML IJ SOLN: 0.0000 [IU] | INTRAMUSCULAR | Status: DC | PRN

## 2022-09-02 MED ORDER — FENTANYL CITRATE (PF) 250 MCG/5ML IJ SOLN
INTRAMUSCULAR | Status: DC | PRN
  Administered 2022-09-02 (×2): 50 ug via INTRAVENOUS

## 2022-09-02 MED ORDER — LACTATED RINGERS IV SOLN: INTRAVENOUS | Status: DC

## 2022-09-02 MED ORDER — ONDANSETRON HCL 4 MG/2ML IJ SOLN
INTRAMUSCULAR | Status: DC | PRN
  Administered 2022-09-02: 4 mg via INTRAVENOUS

## 2022-09-02 MED ORDER — AMISULPRIDE (ANTIEMETIC) 5 MG/2ML IV SOLN: 10.0000 mg | Freq: Once | INTRAVENOUS | Status: DC | PRN

## 2022-09-02 MED ORDER — DEXAMETHASONE SODIUM PHOSPHATE 10 MG/ML IJ SOLN
INTRAMUSCULAR | Status: DC | PRN
  Administered 2022-09-02: 5 mg via INTRAVENOUS

## 2022-09-02 MED ORDER — SUGAMMADEX SODIUM 200 MG/2ML IV SOLN
INTRAVENOUS | Status: DC | PRN
  Administered 2022-09-02: 400 mg via INTRAVENOUS

## 2022-09-02 MED ORDER — PROPOFOL 10 MG/ML IV BOLUS
INTRAVENOUS | Status: DC | PRN
  Administered 2022-09-02: 150 mg via INTRAVENOUS

## 2022-09-02 MED ORDER — PHENYLEPHRINE 80 MCG/ML (10ML) SYRINGE FOR IV PUSH (FOR BLOOD PRESSURE SUPPORT): PREFILLED_SYRINGE | INTRAVENOUS | Status: DC | PRN

## 2022-09-02 MED ORDER — LIDOCAINE 2% (20 MG/ML) 5 ML SYRINGE
INTRAMUSCULAR | Status: DC | PRN
  Administered 2022-09-02: 40 mg via INTRAVENOUS

## 2022-09-02 MED ORDER — ROCURONIUM BROMIDE 10 MG/ML (PF) SYRINGE
PREFILLED_SYRINGE | INTRAVENOUS | Status: DC | PRN
  Administered 2022-09-02: 70 mg via INTRAVENOUS

## 2022-09-02 MED ORDER — CHLORHEXIDINE GLUCONATE 0.12 % MT SOLN
OROMUCOSAL | Status: AC
  Filled 2022-09-02: qty 15

## 2022-09-02 NOTE — Interval H&P Note (Signed)
History and Physical Interval Note:  09/02/2022 8:52 AM  Roger Lowe.  has presented today for surgery, with the diagnosis of lung nodule.  The various methods of treatment have been discussed with the patient and family. After consideration of risks, benefits and other options for treatment, the patient has consented to  Procedure(s) with comments: ROBOTIC ASSISTED NAVIGATIONAL BRONCHOSCOPY (Left) - ION w/ CIOS as a surgical intervention.  The patient's history has been reviewed, patient examined, no change in status, stable for surgery.  I have reviewed the patient's chart and labs.  Questions were answered to the patient's satisfaction.     [place]

## 2022-09-02 NOTE — Anesthesia Postprocedure Evaluation (Signed)
Anesthesia Post Note  Patient: Roger Lowe.  Procedure(s) Performed: ROBOTIC ASSISTED NAVIGATIONAL BRONCHOSCOPY (Left) BRONCHIAL BRUSHINGS BRONCHIAL BIOPSIES     Patient location during evaluation: PACU Anesthesia Type: General Level of consciousness: awake and alert Pain management: pain level controlled Vital Signs Assessment: post-procedure vital signs reviewed and stable Respiratory status: spontaneous breathing, nonlabored ventilation, respiratory function stable and patient connected to nasal cannula oxygen Cardiovascular status: blood pressure returned to baseline and stable Postop Assessment: no apparent nausea or vomiting Anesthetic complications: no   No notable events documented.  Last Vitals:  Vitals:   09/02/22 1030 09/02/22 1045  BP: (!) 143/85 (!) 150/89  Pulse: 98 93  Resp: 15 15  Temp:  36.9 C  SpO2: 98% 96%    Last Pain:  Vitals:   09/02/22 1045  TempSrc:   PainSc: 0-No pain                 Effie Berkshire

## 2022-09-02 NOTE — Discharge Instructions (Signed)
Flexible Bronchoscopy, Care After This sheet gives you information about how to care for yourself after your test. Your doctor may also give you more specific instructions. If you have problems or questions, contact your doctor. Follow these instructions at home: Eating and drinking Do not eat or drink anything (not even water) for 2 hours after your test, or until your numbing medicine (local anesthetic) wears off. When your numbness is gone and your cough and gag reflexes have come back, you may: Eat only soft foods. Slowly drink liquids. The day after the test, go back to your normal diet. Driving Do not drive for 24 hours if you were given a medicine to help you relax (sedative). Do not drive or use heavy machinery while taking prescription pain medicine. General instructions  Take over-the-counter and prescription medicines only as told by your doctor. Return to your normal activities as told. Ask what activities are safe for you. Do not use any products that have nicotine or tobacco in them. This includes cigarettes and e-cigarettes. If you need help quitting, ask your doctor. Keep all follow-up visits as told by your doctor. This is important. It is very important if you had a tissue sample (biopsy) taken. Get help right away if: You have shortness of breath that gets worse. You get light-headed. You feel like you are going to pass out (faint). You have chest pain. You cough up: More than a little blood. More blood than before. Summary Do not eat or drink anything (not even water) for 2 hours after your test, or until your numbing medicine wears off. Do not use cigarettes. Do not use e-cigarettes. Get help right away if you have chest pain.  This information is not intended to replace advice given to you by your health care provider. Make sure you discuss any questions you have with your health care provider. Document Released: 06/15/2009 Document Revised: 07/31/2017 Document  Reviewed: 09/05/2016 Elsevier Patient Education  2020 [employer].

## 2022-09-02 NOTE — Transfer of Care (Signed)
Immediate Anesthesia Transfer of Care Note  Patient: Roger Lowe.  Procedure(s) Performed: ROBOTIC ASSISTED NAVIGATIONAL BRONCHOSCOPY (Left) BRONCHIAL BRUSHINGS BRONCHIAL BIOPSIES  Patient Location: PACU  Anesthesia Type:General  Level of Consciousness: awake, alert , and oriented  Airway & Oxygen Therapy: Patient Spontanous Breathing and Patient connected to face mask oxygen  Post-op Assessment: Report given to RN and Post -op Vital signs reviewed and stable  Post vital signs: Reviewed and stable  Last Vitals:  Vitals Value Taken Time  BP 134/86 09/02/22 1007  Temp    Pulse 98 09/02/22 1009  Resp 16 09/02/22 1009  SpO2 96 % 09/02/22 1009  Vitals shown include unvalidated device data.  Last Pain:  Vitals:   09/02/22 0740  TempSrc:   PainSc: 4       Patients Stated Pain Goal: 2 (83/47/58 3074)  Complications: No notable events documented.

## 2022-09-02 NOTE — Op Note (Signed)
Video Bronchoscopy with Robotic Assisted Bronchoscopic Navigation   Date of Operation: 09/02/2022   Pre-op Diagnosis: Left lower lobe pulmonary nodule  Post-op Diagnosis: Left lower lobe pulmonary nodule  Surgeon: Garner Nash, DO   Assistants: None   Anesthesia: General endotracheal anesthesia  Operation: Flexible video fiberoptic bronchoscopy with robotic assistance and biopsies.  Estimated Blood Loss: Minimal  Complications: None  Indications and History: Roger Netter. is a 77 y.o. male with history of left lower lobe pulmonary nodule. The risks, benefits, complications, treatment options and expected outcomes were discussed with the patient.  The possibilities of pneumothorax, pneumonia, reaction to medication, pulmonary aspiration, perforation of a viscus, bleeding, failure to diagnose a condition and creating a complication requiring transfusion or operation were discussed with the patient who freely signed the consent.    Description of Procedure: The patient was seen in the Preoperative Area, was examined and was deemed appropriate to proceed.  The patient was taken to [facility] endoscopy room 3, identified as Roger Lowe. and the procedure verified as Flexible Video Fiberoptic Bronchoscopy.  A Time Out was held and the above information confirmed.   Prior to the date of the procedure a high-resolution CT scan of the chest was performed. Utilizing ION software program a virtual tracheobronchial tree was generated to allow the creation of distinct navigation pathways to the patient's parenchymal abnormalities. After being taken to the operating room general anesthesia was initiated and the patient  was orally intubated. The video fiberoptic bronchoscope was introduced via the endotracheal tube and a general inspection was performed which showed normal right and left lung anatomy, aspiration of the bilateral mainstems was completed to remove any remaining secretions. Robotic catheter  inserted into patient's endotracheal tube.   Target #1 left lower lobe pulmonary nodule: The distinct navigation pathways prepared prior to this procedure were then utilized to navigate to patient's lesion identified on CT scan. The robotic catheter was secured into place and the vision probe was withdrawn.  Lesion location was approximated using fluoroscopy and three-dimensional cone beam CT imaging. Under fluoroscopic guidance transbronchial needle brushings, transbronchial needle biopsies, and transbronchial forceps biopsies were performed to be sent for cytology and pathology  At the end of the procedure a general airway inspection was performed and there was no evidence of active bleeding. The bronchoscope was removed.  The patient tolerated the procedure well. There was no significant blood loss and there were no obvious complications. A post-procedural chest x-ray is pending.  Samples Target #1: 1. Transbronchial needle brushings from left lower lobe 2. Transbronchial forceps biopsies from left lower lobe  Plans:  The patient will be discharged from the PACU to home when recovered from anesthesia and after chest x-ray is reviewed. We will review the cytology, pathology results with the patient when they become available. Outpatient followup will be with Garner Nash, DO.  Garner Nash, DO [facility] 09/02/2022 10:05 AM

## 2022-09-02 NOTE — Anesthesia Procedure Notes (Signed)
Procedure Name: Intubation Date/Time: 09/02/2022 9:30 AM  Performed by: Bryson Corona, CRNAPre-anesthesia Checklist: Patient identified, Emergency Drugs available, Suction available and Patient being monitored Patient Re-evaluated:Patient Re-evaluated prior to induction Oxygen Delivery Method: Circle System Utilized Preoxygenation: Pre-oxygenation with 100% oxygen Induction Type: IV induction Ventilation: Mask ventilation without difficulty Laryngoscope Size: Mac and 4 Grade View: Grade I Tube type: Oral Tube size: 8.5 mm Number of attempts: 1 Airway Equipment and Method: Stylet Placement Confirmation: ETT inserted through vocal cords under direct vision, positive ETCO2 and breath sounds checked- equal and bilateral Secured at: 22 cm Tube secured with: Tape Dental Injury: Teeth and Oropharynx as per pre-operative assessment

## 2022-09-03 ENCOUNTER — Encounter (HOSPITAL_COMMUNITY): Payer: Self-pay | Admitting: Pulmonary Disease

## 2022-09-04 LAB — CYTOLOGY - NON PAP

## 2022-09-08 ENCOUNTER — Encounter: Payer: Self-pay | Admitting: Internal Medicine

## 2022-09-08 NOTE — Progress Notes (Signed)
Thoracic Location of Tumor / Histology: Left Lower Lobe Lung Nodule  09/02/2022 Roger Lowe DG Chest Port 1 View CLINICAL DATA:  Status post left lung bronchoscopy   FINDINGS: Heart size is normal. Left chest multi lead pacer. Irregular nodular opacity of the left midlung containing a biopsy marking clip. No pneumothorax or acute appearing airspace opacity. Dependent bibasilar scarring and bronchiectasis.   IMPRESSION: 1. Irregular nodular opacity of the left midlung containing a biopsy marking clip. No pneumothorax or acute appearing airspace opacity. 2. Dependent bibasilar scarring and bronchiectasis.   08/05/2022 Roger Lowe CT Super D Chest without Contrast CLINICAL DATA:  Left lower lobe pulmonary nodule. History of COPD, mycobacterium abscessus colonization and lung cancer. * Tracking Code: BO *  FINDINGS: Cardiovascular: Mild atherosclerosis of the aorta, great vessels and coronary arteries. Left-sided pacemaker with leads in the right atrium, right ventricle and coronary sinus. The heart size is normal. There is no pericardial effusion.   Mediastinum/Nodes: There are no enlarged mediastinal, hilar or axillary lymph nodes.Small mediastinal lymph nodes are unchanged.  Hilar assessment is limited by the lack of intravenous contrast, although the hilar contours appear unchanged. The thyroid gland, trachea and esophagus demonstrate no significant findings.   Lungs/Pleura: No pleural effusion or pneumothorax. Mild centrilobular emphysema with chronic bronchiectasis and scarring in both lower lobes. The solid nodule anteriorly in the left lower lobe measures 1.7 x 1.7 cm on image 96/4 and has slightly enlarged from the previous study (1.3 x 1.3 cm). Unchanged fiducial marker along the superior aspect of this nodule. No other new or enlarging pulmonary nodules.   Upper abdomen: The visualized upper abdomen appears stable without suspicious findings. Unchanged cystic appearing  lesion in the upper pole of the left kidney for which no follow-up imaging is recommended.   Musculoskeletal/Chest wall: There is no chest wall mass or suspicious osseous finding. Multilevel spondylosis. Peripherally sclerotic lesions laterally in the left 6th and 7th ribs are stable and appear nonaggressive. Multiple subcutaneous nodules within the anterior chest wall are grossly stable. Mild bilateral gynecomastia.   IMPRESSION: 1. Imaging for bronchoscopy planning and guidance. 2. The solid nodule anteriorly in the left lower lobe has slightly enlarged from the previous study, now measuring up to 1.7 cm in diameter. This remains suspicious for bronchogenic carcinoma. 3. No evidence of metastatic disease or other suspicious pulmonary nodule. 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 4. Aortic Atherosclerosis (ICD10-I70.0) and Emphysema (ICD10-J43.9).   04/01/2022 Roger Lowe CT Chest without Contrast CLINICAL DATA:  Pulmonary nodule  FINDINGS: Cardiovascular: The heart size is normal. No substantial pericardial effusion. Coronary artery calcification is evident. Mild atherosclerotic calcification is noted in the wall of the thoracic aorta. Left permanent pacemaker noted.   Mediastinum/Nodes: No mediastinal lymphadenopathy. No evidence for gross hilar lymphadenopathy although assessment is limited by the lack of intravenous contrast on the current study. The esophagus has normal imaging features. There is no axillary lymphadenopathy.   Lungs/Pleura: Centrilobular emphsyema noted. Basilar predominant bronchial wall thickening and cylindrical bronchiectasis is similar to prior. Left lower lobe pulmonary nodule is not substantially changed measuring 1.3 x 1.3 cm today compared to 1.5 x 1.3 cm previously. Chronic consolidative opacity at the left base is likely chronic atelectasis/scarring. No evidence for pleural effusion.   Upper Abdomen: Similar appearance of a  cyst in the upper pole left kidney although incompletely visualized.   Musculoskeletal: No worrisome lytic or sclerotic osseous abnormality.   IMPRESSION: 1. No substantial interval change  in the 1.3 x 1.3 cm left lower lobe pulmonary nodule. This nodule was noted to be FDG avid on prior PET imaging. 2. Chronic consolidative opacity at the left base is likely chronic atelectasis/scarring. 3. Bronchial wall thickening with cylindrical bronchiectasis at the lung bases suggest chronic infectious/inflammatory process. 4. Aortic Atherosclerosis (ICD10-I70.0) and Emphysema (ICD10-J43.9).    Tobacco/Marijuana/Snuff/ETOH use: Quit smoking 09/04/2009, No drug, smokeless, or alcohol use.  Past/Anticipated interventions by cardiothoracic surgery, if any:    09/02/2022 Roger Lowe Robotic Assisted Navigational Bronchoscopy  Past/Anticipated interventions by medical oncology, if any:  NA  Signs/Symptoms Weight changes, if any: {:18581} Respiratory complaints, if any: {:18581} Hemoptysis, if any: {:18581} Pain issues, if any:  {:18581}  SAFETY ISSUES: Prior radiation? {:18581} Pacemaker/ICD? Yes,  Pacemaker and Defibrillator (Dr. Cristopher Lowe 2021). Possible current pregnancy? Male Is the patient on methotrexate? No  Current Complaints / other details:

## 2022-09-08 NOTE — Progress Notes (Signed)
TO BE COMPLETED BY RADIATION ONCOLOGIST OFFICE:   Patient Name: Roger Lowe.   Date of Birth: 1945/10/06   Radiation Oncologist: Dr. Tyler Pita   Site to be Treated: Left Lower Lung Nodule   Will x-rays >10 MV be used? No   Will the radiation be >10 cm from the device? Yes   Planned Treatment Start Date: 1 week   TO BE COMPLETED BY CARDIOLOGIST OFFICE:   Device Information:  Pacemaker []      ICD [x]    Brand: St. Jude/Abbott: [phone] Model #: Donnajean Lopes [id number] [place] HF  Serial Number: [id number]     Date of Placement: 11/07/2019  Site of Placement: left chest  Remote Device Check--Frequency: every 91 days Last Check: 08/19/2022  Is the Patient Pacer Dependent?:  Yes []   No [x]   Does cardiologist request Radiation Oncology to schedule device testing by vendor for the following:  Prior to the Initiation of Treatments?  Yes []  No [x]  During Treatments?  Yes []  No [x]  Post Radiation Treatments?  Yes []  No [x]   Is device monitoring necessary by vendor/cardiologist team during treatments?  Yes []   No [x]   Is cardiac monitoring by Radiation Oncology nursing necessary during treatments? Yes [x]   No []   Do you recommend device be relocated prior to Radiation Treatment? Yes []   No [x]   **PLEASE LIST ANY NOTES OR SPECIAL REQUESTS:       CARDIOLOGIST SIGNATURE:  Dr. Cristopher Peru Per [facility] Standing Orders, Damian Leavell  09/08/2022 12:07 PM  **Please route completed form back to Radiation Oncology Nursing and "[place]", OR send an update if there will be a delay in having form completed by expected start date.  **Call [phone] if you have any questions or do not get an in-basket response from a Radiation Oncology staff member

## 2022-09-09 ENCOUNTER — Ambulatory Visit
Admission: RE | Admit: 2022-09-09 | Discharge: 2022-09-09 | Disposition: A | Discharge status: Home / Self Care | Payer: Medicare HMO | Source: Ambulatory Visit | Attending: Radiation Oncology | Admitting: Radiation Oncology

## 2022-09-09 ENCOUNTER — Encounter: Payer: Self-pay | Admitting: Acute Care

## 2022-09-09 ENCOUNTER — Ambulatory Visit (INDEPENDENT_AMBULATORY_CARE_PROVIDER_SITE_OTHER): Payer: Medicare HMO | Admitting: Acute Care

## 2022-09-09 ENCOUNTER — Other Ambulatory Visit: Payer: Self-pay

## 2022-09-09 VITALS — BP 144/78 | HR 103 | Temp 98.2°F | Ht 73.0 in | Wt 218.6 lb

## 2022-09-09 VITALS — BP 149/86 | HR 109 | Temp 97.3°F | Resp 18 | Ht 73.0 in | Wt 214.2 lb

## 2022-09-09 DIAGNOSIS — Z85118 Personal history of other malignant neoplasm of bronchus and lung: Secondary | ICD-10-CM

## 2022-09-09 DIAGNOSIS — J441 Chronic obstructive pulmonary disease with (acute) exacerbation: Secondary | ICD-10-CM

## 2022-09-09 DIAGNOSIS — R911 Solitary pulmonary nodule: Secondary | ICD-10-CM | POA: Diagnosis not present

## 2022-09-09 DIAGNOSIS — C3492 Malignant neoplasm of unspecified part of left bronchus or lung: Secondary | ICD-10-CM

## 2022-09-09 DIAGNOSIS — C3432 Malignant neoplasm of lower lobe, left bronchus or lung: Secondary | ICD-10-CM | POA: Diagnosis not present

## 2022-09-09 MED ORDER — DOXYCYCLINE HYCLATE 100 MG PO TABS: 100.0000 mg | ORAL_TABLET | Freq: Two times a day (BID) | ORAL | 0 refills | Status: AC
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
## 2022-09-09 NOTE — Patient Instructions (Addendum)
It is good to see you today. Please call the office if the blood tinged secretions get worse not better or if the become bright red in color instead of the dark color you see now. I have sent in a prescription for the discolored sputum you have been coughing up. This is called Doxycycline, and you will take 1 tablet twice daily for 7 days. Take until gone. Take with a full glass of water. If you are out in the sun, this medication can make your skin sensitive to the sun, so please use sun block, or cover up.  Follow up with Judson Roch NP in 2 months to ensure you are getting better. Continue Brovana, Pulmicort and DuoNebs as you have been doing Continue Spiriva as you have been doing. Rinse mouth after use. Continue oxygen at 2-4 L as needed to maintain oxygen saturation levels.  Titrate oxygen to maintain oxygen levels at > 88% at all times.  Call if you need [place] sooner, and we will get you in.  Your biopsies were + for adenocarcinoma. Best option for treatment is radiation to the area  Dr. Tammi Klippel will  will take great care of you. Good luck with your radiation treatments.  Please contact office for sooner follow up if symptoms do not improve or worsen or seek emergency care
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
## 2022-09-10 NOTE — Progress Notes (Signed)
Thanks, Discussed with patient via SG in recent office visit.   Thanks,  BLI  Garner Nash, DO [facility] 09/10/2022 8:03 AM

## 2022-09-11 DIAGNOSIS — Z87891 Personal history of nicotine dependence: Secondary | ICD-10-CM | POA: Diagnosis not present

## 2022-09-11 DIAGNOSIS — C3432 Malignant neoplasm of lower lobe, left bronchus or lung: Secondary | ICD-10-CM | POA: Diagnosis not present

## 2022-09-12 ENCOUNTER — Other Ambulatory Visit: Payer: Self-pay

## 2022-09-12 ENCOUNTER — Ambulatory Visit
Admission: RE | Admit: 2022-09-12 | Discharge: 2022-09-12 | Disposition: A | Discharge status: Home / Self Care | Payer: Medicare HMO | Source: Ambulatory Visit | Attending: Radiation Oncology | Admitting: Radiation Oncology

## 2022-09-12 DIAGNOSIS — C3492 Malignant neoplasm of unspecified part of left bronchus or lung: Secondary | ICD-10-CM

## 2022-09-12 DIAGNOSIS — C3432 Malignant neoplasm of lower lobe, left bronchus or lung: Secondary | ICD-10-CM | POA: Insufficient documentation

## 2022-09-12 DIAGNOSIS — Z510 Encounter for antineoplastic radiation therapy: Secondary | ICD-10-CM | POA: Diagnosis not present

## 2022-09-12 DIAGNOSIS — Z87891 Personal history of nicotine dependence: Secondary | ICD-10-CM | POA: Diagnosis not present

## 2022-09-12 NOTE — Progress Notes (Signed)
The proposed treatment discussed in conference is for discussion purpose only and is not a binding recommendation.  The patients have not been physically examined, or presented with their treatment options.  Therefore, final treatment plans cannot be decided.  

## 2022-09-12 NOTE — Progress Notes (Signed)
  Radiation Oncology         [phone]) [phone] ________________________________  Name: Roger Lowe. MRN: [id number]  Date: 09/12/2022  DOB: 07-Jun-1946  STEREOTACTIC BODY RADIOTHERAPY SIMULATION AND TREATMENT PLANNING NOTE    ICD-10-CM   1. 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  Identity was confirmed.  All relevant records and images related to the planned course of therapy were reviewed.  The patient freely provided informed written consent to proceed with treatment after reviewing the details related to the planned course of therapy. The consent form was witnessed and verified by the simulation staff.  Then, the patient was set-up in a stable reproducible  supine position for radiation therapy.  A BodyFix immobilization pillow was fabricated for reproducible positioning.  Then I personally applied the abdominal compression paddle to limit respiratory excursion.  4D respiratoy motion management CT images were obtained.  Surface markings were placed.  The CT images were loaded into the planning software.  Then, using Cine, MIP, and standard views, the internal target volume (ITV) and planning target volumes (PTV) were delinieated, and avoidance structures were contoured.  Treatment planning then occurred.  The radiation prescription was entered and confirmed.  A total of two complex treatment devices were fabricated in the form of the BodyFix immobilization pillow and a neck accuform cushion.  I have requested : 3D Simulation  I have requested a DVH of the following structures: Heart, Lungs, Esophagus, Chest Wall, Brachial Plexus, Major Blood Vessels, and targets.  SPECIAL TREATMENT PROCEDURE:  The planned course of therapy using radiation constitutes a special treatment procedure. Special care is required in the  management of this patient for the following reasons. This treatment constitutes a Special Treatment Procedure for the following reason: [ High dose per fraction requiring special monitoring for increased toxicities of treatment including daily imaging..  The special nature of the planned course of radiotherapy will require increased physician supervision and oversight to ensure patient's safety with optimal treatment outcomes.  This requires extended time and effort.    RESPIRATORY MOTION MANAGEMENT SIMULATION:  In order to account for effect of respiratory motion on target structures and other organs in the planning and delivery of radiotherapy, this patient underwent respiratory motion management simulation.  To accomplish this, when the patient was brought to the CT simulation planning suite, 4D respiratoy motion management CT images were obtained.  The CT images were loaded into the planning software.  Then, using a variety of tools including Cine, MIP, and standard views, the target volume and planning target volumes (PTV) were delineated.  Avoidance structures were contoured.  Treatment planning then occurred.  Dose volume histograms were generated and reviewed for each of the requested structure.  The resulting plan was carefully reviewed and approved today.  PLAN:  The patient was discovered to have previous chemoradiation to the left lung in 1996 while living in [place], following our initial consult.  Those records are not available.  I will receive 60 Gy in 5 fractions using longer fractionation scheme given some risk of overlapping radiation.  ________________________________  Artist Pais Kathrynn Running, M.D.

## 2022-09-16 ENCOUNTER — Ambulatory Visit: Payer: Medicare HMO | Admitting: Urology

## 2022-09-19 DIAGNOSIS — R059 Cough, unspecified: Secondary | ICD-10-CM | POA: Diagnosis not present

## 2022-09-19 DIAGNOSIS — C3490 Malignant neoplasm of unspecified part of unspecified bronchus or lung: Secondary | ICD-10-CM | POA: Diagnosis not present

## 2022-09-19 DIAGNOSIS — E1165 Type 2 diabetes mellitus with hyperglycemia: Secondary | ICD-10-CM | POA: Diagnosis not present

## 2022-09-22 ENCOUNTER — Inpatient Hospital Stay: Payer: Medicare HMO | Attending: Internal Medicine

## 2022-09-22 DIAGNOSIS — Z95810 Presence of automatic (implantable) cardiac defibrillator: Secondary | ICD-10-CM

## 2022-09-22 DIAGNOSIS — I5022 Chronic systolic (congestive) heart failure: Secondary | ICD-10-CM | POA: Diagnosis not present

## 2022-09-22 DIAGNOSIS — C3432 Malignant neoplasm of lower lobe, left bronchus or lung: Secondary | ICD-10-CM | POA: Diagnosis not present

## 2022-09-22 DIAGNOSIS — Z510 Encounter for antineoplastic radiation therapy: Secondary | ICD-10-CM | POA: Diagnosis not present

## 2022-09-22 DIAGNOSIS — Z87891 Personal history of nicotine dependence: Secondary | ICD-10-CM | POA: Diagnosis not present

## 2022-09-22 NOTE — Progress Notes (Signed)
Remote ICD transmission.   

## 2022-09-23 ENCOUNTER — Other Ambulatory Visit: Payer: Self-pay

## 2022-09-23 ENCOUNTER — Ambulatory Visit
Admission: RE | Admit: 2022-09-23 | Discharge: 2022-09-23 | Disposition: A | Discharge status: Home / Self Care | Payer: Medicare HMO | Source: Ambulatory Visit | Attending: Radiation Oncology | Admitting: Radiation Oncology

## 2022-09-23 DIAGNOSIS — C3432 Malignant neoplasm of lower lobe, left bronchus or lung: Secondary | ICD-10-CM | POA: Diagnosis not present

## 2022-09-23 DIAGNOSIS — C3492 Malignant neoplasm of unspecified part of left bronchus or lung: Secondary | ICD-10-CM

## 2022-09-23 DIAGNOSIS — Z510 Encounter for antineoplastic radiation therapy: Secondary | ICD-10-CM | POA: Diagnosis not present

## 2022-09-23 LAB — RAD ONC ARIA SESSION SUMMARY
Course Elapsed Days: 0
Plan Fractions Treated to Date: 1
Plan Prescribed Dose Per Fraction: 12 Gy
Plan Total Fractions Prescribed: 5
Plan Total Prescribed Dose: 60 Gy
Reference Point Dosage Given to Date: 12 Gy
Reference Point Session Dosage Given: 12 Gy
Session Number: 1

## 2022-09-24 NOTE — Progress Notes (Signed)
EPIC Encounter for ICM Monitoring  Patient Name: Roger Lowe. is a 77 y.o. male Date: 09/24/2022 Primary Care Physican: [name], MD Primary Cardiologist: Branch Electrophysiologist: Lovena Le BiV Pacing: >99% 10/09/2021 Weight: 198 lbs 11/20/2021 Weight: 207 lbs 02/13/2022 Office Weight: 213 lbs 05/13/2022 Weight: 213 lbs - 214 lbs 09/24/2022 Weight: 216 lbs         Spoke with patient and heart failure questions reviewed.  Transmission results reviewed.  Pt asymptomatic for fluid accumulation.  Pt was vacation [place] during Christmas holiday.   Lung cancer has returned and started radiation 1/23 and      unsure if he will need chemo.     CorVue thoracic impedance suggesting normal fluid levels with the exception of possible fluid accumulation during Christmas Holiday.   Prescribed: Furosemide 40 mg take 1 tablet daily. Spironolactone 25 mg take 1 tablet daily Farxiga 5 mg take 1 tablet daily   Labs: 09/02/2022 Creatinine 1.28, BUN 6,   Potassium 4.0, Sodium 136, GFR 59 02/06/2022 Creatinine 1.32, BUN 25, Potassium 4.1, Sodium 138, GFR 57 A complete set of results can be found in Results Review.   Recommendations:  No changes and encouraged to call if experiencing any fluid symptoms.   Follow-up plan: ICM clinic phone appointment on 10/27/2022   91 day device clinic remote transmission 11/17/2022.     EP/Cardiology Office Visits:  11/12/2022 with Dr Harl Bowie.   Recall 07/03/2023 with Dr. Lovena Le.     Copy of ICM check sent to Dr. Lovena Le.   3 month ICM trend: 09/21/2022.    12-14 Month ICM trend:     [name], RN 09/24/2022 9:49 AM

## 2022-09-25 ENCOUNTER — Other Ambulatory Visit: Payer: Self-pay

## 2022-09-25 ENCOUNTER — Ambulatory Visit
Admission: RE | Admit: 2022-09-25 | Discharge: 2022-09-25 | Disposition: A | Discharge status: Home / Self Care | Payer: Medicare HMO | Source: Ambulatory Visit | Attending: Radiation Oncology | Admitting: Radiation Oncology

## 2022-09-25 DIAGNOSIS — C3432 Malignant neoplasm of lower lobe, left bronchus or lung: Secondary | ICD-10-CM | POA: Diagnosis not present

## 2022-09-25 DIAGNOSIS — Z510 Encounter for antineoplastic radiation therapy: Secondary | ICD-10-CM | POA: Diagnosis not present

## 2022-09-25 DIAGNOSIS — C3492 Malignant neoplasm of unspecified part of left bronchus or lung: Secondary | ICD-10-CM

## 2022-09-25 LAB — RAD ONC ARIA SESSION SUMMARY
Course Elapsed Days: 2
Plan Fractions Treated to Date: 2
Plan Prescribed Dose Per Fraction: 12 Gy
Plan Total Fractions Prescribed: 5
Plan Total Prescribed Dose: 60 Gy
Reference Point Dosage Given to Date: 24 Gy
Reference Point Session Dosage Given: 12 Gy
Session Number: 2

## 2022-09-29 ENCOUNTER — Other Ambulatory Visit: Payer: Self-pay

## 2022-09-29 ENCOUNTER — Ambulatory Visit
Admission: RE | Admit: 2022-09-29 | Discharge: 2022-09-29 | Disposition: A | Discharge status: Home / Self Care | Payer: Medicare HMO | Source: Ambulatory Visit | Attending: Radiation Oncology | Admitting: Radiation Oncology

## 2022-09-29 DIAGNOSIS — Z510 Encounter for antineoplastic radiation therapy: Secondary | ICD-10-CM | POA: Diagnosis not present

## 2022-09-29 DIAGNOSIS — C3432 Malignant neoplasm of lower lobe, left bronchus or lung: Secondary | ICD-10-CM | POA: Diagnosis not present

## 2022-09-29 LAB — RAD ONC ARIA SESSION SUMMARY
Course Elapsed Days: 6
Plan Fractions Treated to Date: 3
Plan Prescribed Dose Per Fraction: 12 Gy
Plan Total Fractions Prescribed: 5
Plan Total Prescribed Dose: 60 Gy
Reference Point Dosage Given to Date: 36 Gy
Reference Point Session Dosage Given: 12 Gy
Session Number: 3

## 2022-09-30 DIAGNOSIS — J449 Chronic obstructive pulmonary disease, unspecified: Secondary | ICD-10-CM | POA: Diagnosis not present

## 2022-10-01 ENCOUNTER — Other Ambulatory Visit: Payer: Self-pay

## 2022-10-01 ENCOUNTER — Ambulatory Visit
Admission: RE | Admit: 2022-10-01 | Discharge: 2022-10-01 | Disposition: A | Discharge status: Home / Self Care | Payer: Medicare HMO | Source: Ambulatory Visit | Attending: Radiation Oncology | Admitting: Radiation Oncology

## 2022-10-01 DIAGNOSIS — C3492 Malignant neoplasm of unspecified part of left bronchus or lung: Secondary | ICD-10-CM

## 2022-10-01 DIAGNOSIS — C3432 Malignant neoplasm of lower lobe, left bronchus or lung: Secondary | ICD-10-CM | POA: Diagnosis not present

## 2022-10-01 DIAGNOSIS — Z510 Encounter for antineoplastic radiation therapy: Secondary | ICD-10-CM | POA: Diagnosis not present

## 2022-10-01 LAB — RAD ONC ARIA SESSION SUMMARY
Course Elapsed Days: 8
Plan Fractions Treated to Date: 4
Plan Prescribed Dose Per Fraction: 12 Gy
Plan Total Fractions Prescribed: 5
Plan Total Prescribed Dose: 60 Gy
Reference Point Dosage Given to Date: 48 Gy
Reference Point Session Dosage Given: 12 Gy
Session Number: 4

## 2022-10-02 DIAGNOSIS — J449 Chronic obstructive pulmonary disease, unspecified: Secondary | ICD-10-CM | POA: Diagnosis not present

## 2022-10-03 ENCOUNTER — Encounter: Payer: Self-pay | Admitting: Urology

## 2022-10-03 ENCOUNTER — Ambulatory Visit
Admission: RE | Admit: 2022-10-03 | Discharge: 2022-10-03 | Disposition: A | Discharge status: Home / Self Care | Payer: Medicare HMO | Source: Ambulatory Visit | Attending: Radiation Oncology | Admitting: Radiation Oncology

## 2022-10-03 ENCOUNTER — Other Ambulatory Visit: Payer: Self-pay

## 2022-10-03 DIAGNOSIS — Z87891 Personal history of nicotine dependence: Secondary | ICD-10-CM | POA: Diagnosis not present

## 2022-10-03 DIAGNOSIS — C3432 Malignant neoplasm of lower lobe, left bronchus or lung: Secondary | ICD-10-CM | POA: Insufficient documentation

## 2022-10-03 DIAGNOSIS — Z510 Encounter for antineoplastic radiation therapy: Secondary | ICD-10-CM | POA: Insufficient documentation

## 2022-10-03 DIAGNOSIS — C3492 Malignant neoplasm of unspecified part of left bronchus or lung: Secondary | ICD-10-CM | POA: Insufficient documentation

## 2022-10-03 LAB — RAD ONC ARIA SESSION SUMMARY
Course Elapsed Days: 10
Plan Fractions Treated to Date: 5
Plan Prescribed Dose Per Fraction: 12 Gy
Plan Total Fractions Prescribed: 5
Plan Total Prescribed Dose: 60 Gy
Reference Point Dosage Given to Date: 60 Gy
Reference Point Session Dosage Given: 12 Gy
Session Number: 5

## 2022-10-08 ENCOUNTER — Inpatient Hospital Stay: Payer: Medicare HMO | Attending: Hematology

## 2022-10-08 DIAGNOSIS — C3432 Malignant neoplasm of lower lobe, left bronchus or lung: Secondary | ICD-10-CM | POA: Diagnosis not present

## 2022-10-08 DIAGNOSIS — C3490 Malignant neoplasm of unspecified part of unspecified bronchus or lung: Secondary | ICD-10-CM

## 2022-10-08 DIAGNOSIS — Z8042 Family history of malignant neoplasm of prostate: Secondary | ICD-10-CM | POA: Diagnosis not present

## 2022-10-08 DIAGNOSIS — Z87891 Personal history of nicotine dependence: Secondary | ICD-10-CM | POA: Insufficient documentation

## 2022-10-08 DIAGNOSIS — Z86718 Personal history of other venous thrombosis and embolism: Secondary | ICD-10-CM | POA: Insufficient documentation

## 2022-10-08 DIAGNOSIS — Z86711 Personal history of pulmonary embolism: Secondary | ICD-10-CM | POA: Insufficient documentation

## 2022-10-08 DIAGNOSIS — Z7901 Long term (current) use of anticoagulants: Secondary | ICD-10-CM | POA: Insufficient documentation

## 2022-10-08 LAB — CBC WITH DIFFERENTIAL/PLATELET
Abs Immature Granulocytes: 0.02 10*3/uL (ref 0.00–0.07)
Basophils Absolute: 0 10*3/uL (ref 0.0–0.1)
Basophils Relative: 1 %
Eosinophils Absolute: 0.2 10*3/uL (ref 0.0–0.5)
Eosinophils Relative: 4 %
HCT: 45.9 % (ref 39.0–52.0)
Hemoglobin: 14.4 g/dL (ref 13.0–17.0)
Immature Granulocytes: 0 %
Lymphocytes Relative: 26 %
Lymphs Abs: 1.6 10*3/uL (ref 0.7–4.0)
MCH: 29.3 pg (ref 26.0–34.0)
MCHC: 31.4 g/dL (ref 30.0–36.0)
MCV: 93.5 fL (ref 80.0–100.0)
Monocytes Absolute: 0.8 10*3/uL (ref 0.1–1.0)
Monocytes Relative: 13 %
Neutro Abs: 3.4 10*3/uL (ref 1.7–7.7)
Neutrophils Relative %: 56 %
Platelets: 169 10*3/uL (ref 150–400)
RBC: 4.91 MIL/uL (ref 4.22–5.81)
RDW: 13.8 % (ref 11.5–15.5)
WBC: 6 10*3/uL (ref 4.0–10.5)
nRBC: 0 % (ref 0.0–0.2)

## 2022-10-08 LAB — COMPREHENSIVE METABOLIC PANEL
ALT: 13 U/L (ref 0–44)
AST: 19 U/L (ref 15–41)
Albumin: 3.7 g/dL (ref 3.5–5.0)
Alkaline Phosphatase: 110 U/L (ref 38–126)
Anion gap: 7 (ref 5–15)
BUN: 8 mg/dL (ref 8–23)
CO2: 25 mmol/L (ref 22–32)
Calcium: 9.1 mg/dL (ref 8.9–10.3)
Chloride: 103 mmol/L (ref 98–111)
Creatinine, Ser: 1.18 mg/dL (ref 0.61–1.24)
GFR, Estimated: >60 mL/min (ref >60)
Glucose, Bld: 89 mg/dL (ref 70–99)
Potassium: 3.7 mmol/L (ref 3.5–5.1)
Sodium: 135 mmol/L (ref 135–145)
Total Bilirubin: 0.7 mg/dL (ref 0.3–1.2)
Total Protein: 7.8 g/dL (ref 6.5–8.1)

## 2022-10-10 DIAGNOSIS — E782 Mixed hyperlipidemia: Secondary | ICD-10-CM | POA: Diagnosis not present

## 2022-10-10 DIAGNOSIS — E1165 Type 2 diabetes mellitus with hyperglycemia: Secondary | ICD-10-CM | POA: Diagnosis not present

## 2022-10-10 DIAGNOSIS — M109 Gout, unspecified: Secondary | ICD-10-CM | POA: Diagnosis not present

## 2022-10-13 LAB — PROTEIN ELECTROPHORESIS, SERUM
A/G Ratio: 1 (ref 0.7–1.7)
Albumin ELP: 3.7 g/dL (ref 2.9–4.4)
Alpha-1-Globulin: 0.2 g/dL (ref 0.0–0.4)
Alpha-2-Globulin: 0.8 g/dL (ref 0.4–1.0)
Beta Globulin: 1.3 g/dL (ref 0.7–1.3)
Gamma Globulin: 1.4 g/dL (ref 0.4–1.8)
Globulin, Total: 3.8 g/dL (ref 2.2–3.9)
Total Protein ELP: 7.5 g/dL (ref 6.0–8.5)

## 2022-10-14 ENCOUNTER — Telehealth: Payer: Self-pay | Admitting: Radiation Oncology

## 2022-10-14 NOTE — Telephone Encounter (Signed)
Received VM from pt stating he needed to r/s a missed appt with Dr. Tammi Klippel. Returned call and LVM stating pt did not miss appt with our office.

## 2022-10-15 ENCOUNTER — Inpatient Hospital Stay (HOSPITAL_BASED_OUTPATIENT_CLINIC_OR_DEPARTMENT_OTHER): Payer: Medicare HMO | Admitting: Hematology

## 2022-10-15 VITALS — BP 162/93 | HR 96 | Temp 98.0°F | Resp 18 | Ht 73.0 in | Wt 215.9 lb

## 2022-10-15 DIAGNOSIS — Z7901 Long term (current) use of anticoagulants: Secondary | ICD-10-CM | POA: Diagnosis not present

## 2022-10-15 DIAGNOSIS — Z86718 Personal history of other venous thrombosis and embolism: Secondary | ICD-10-CM | POA: Diagnosis not present

## 2022-10-15 DIAGNOSIS — Z8042 Family history of malignant neoplasm of prostate: Secondary | ICD-10-CM | POA: Diagnosis not present

## 2022-10-15 DIAGNOSIS — Z87891 Personal history of nicotine dependence: Secondary | ICD-10-CM | POA: Diagnosis not present

## 2022-10-15 DIAGNOSIS — C3490 Malignant neoplasm of unspecified part of unspecified bronchus or lung: Secondary | ICD-10-CM | POA: Diagnosis not present

## 2022-10-15 DIAGNOSIS — Z86711 Personal history of pulmonary embolism: Secondary | ICD-10-CM | POA: Diagnosis not present

## 2022-10-15 DIAGNOSIS — C3432 Malignant neoplasm of lower lobe, left bronchus or lung: Secondary | ICD-10-CM | POA: Diagnosis not present
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
## 2022-10-15 NOTE — Patient Instructions (Addendum)
[facility]  Discharge Instructions  You were seen and examined today by Dr. Delton Coombes.  Dr. Delton Coombes discussed your most recent lab work which revealed that everything looks good.  Dr. Delton Coombes will repeat CT of your Chest.  Follow-up as scheduled in 3 months.    Thank you for choosing [facility] to provide your oncology and hematology care.   To afford each patient quality time with our provider, please arrive at least 15 minutes before your scheduled appointment time. You may need to reschedule your appointment if you arrive late (10 or more minutes). Arriving late affects you and other patients whose appointments are after yours.  Also, if you miss three or more appointments without notifying the office, you may be dismissed from the clinic at the provider's discretion.    Again, thank you for choosing [facility].  Our hope is that these requests will decrease the amount of time that you wait before being seen by our physicians.   If you have a lab appointment with the [facility] please come in thru the Main Entrance and check in at the main information desk.           _____________________________________________________________  Should you have questions after your visit to [facility], please contact our office at [phone] and follow the prompts.  Our office hours are 8:00 a.m. to 4:30 p.m. Monday - Thursday and 8:00 a.m. to 2:30 p.m. Friday.  Please note that voicemails left after 4:00 p.m. may not be returned until the following business day.  We are closed weekends and all major holidays.  You do have access to a nurse 24-7, just call the main number to the clinic [phone] and do not press any options, hold on the line and a nurse will answer the phone.    For prescription refill requests, have your pharmacy contact our office and allow 72 hours.    Masks are optional in the  cancer centers. If you would like for your care team to wear a mask while they are taking care of you, please let them know. You may have one support person who is at least 75 years old accompany you for your appointments.

## 2022-10-16 DIAGNOSIS — K219 Gastro-esophageal reflux disease without esophagitis: Secondary | ICD-10-CM | POA: Diagnosis not present

## 2022-10-16 DIAGNOSIS — I2510 Atherosclerotic heart disease of native coronary artery without angina pectoris: Secondary | ICD-10-CM | POA: Diagnosis not present

## 2022-10-16 DIAGNOSIS — E1165 Type 2 diabetes mellitus with hyperglycemia: Secondary | ICD-10-CM | POA: Diagnosis not present

## 2022-10-16 DIAGNOSIS — R059 Cough, unspecified: Secondary | ICD-10-CM | POA: Diagnosis not present

## 2022-10-16 DIAGNOSIS — C3490 Malignant neoplasm of unspecified part of unspecified bronchus or lung: Secondary | ICD-10-CM | POA: Diagnosis not present

## 2022-10-16 DIAGNOSIS — G4700 Insomnia, unspecified: Secondary | ICD-10-CM | POA: Diagnosis not present

## 2022-10-16 DIAGNOSIS — N1831 Chronic kidney disease, stage 3a: Secondary | ICD-10-CM | POA: Diagnosis not present

## 2022-10-16 DIAGNOSIS — E119 Type 2 diabetes mellitus without complications: Secondary | ICD-10-CM | POA: Diagnosis not present

## 2022-10-16 DIAGNOSIS — Z0001 Encounter for general adult medical examination with abnormal findings: Secondary | ICD-10-CM | POA: Diagnosis not present

## 2022-10-16 DIAGNOSIS — E1122 Type 2 diabetes mellitus with diabetic chronic kidney disease: Secondary | ICD-10-CM | POA: Diagnosis not present

## 2022-10-21 DIAGNOSIS — J449 Chronic obstructive pulmonary disease, unspecified: Secondary | ICD-10-CM | POA: Diagnosis not present

## 2022-10-27 ENCOUNTER — Inpatient Hospital Stay: Payer: Medicare HMO | Attending: Internal Medicine

## 2022-10-27 DIAGNOSIS — Z95810 Presence of automatic (implantable) cardiac defibrillator: Secondary | ICD-10-CM | POA: Diagnosis not present

## 2022-10-27 DIAGNOSIS — I5022 Chronic systolic (congestive) heart failure: Secondary | ICD-10-CM

## 2022-10-29 ENCOUNTER — Telehealth: Payer: Self-pay

## 2022-10-29 NOTE — Telephone Encounter (Signed)
Remote ICM transmission received.  Attempted call to patient regarding ICM remote transmission and mail box is full. 

## 2022-10-29 NOTE — Progress Notes (Signed)
EPIC Encounter for ICM Monitoring  Patient Name: Roger Lowe. is a 77 y.o. male Date: 10/29/2022 Primary Care Physican: [name], MD Primary Cardiologist: Branch Electrophysiologist: Lovena Le BiV Pacing: >99% 10/09/2021 Weight: 198 lbs 11/20/2021 Weight: 207 lbs 02/13/2022 Office Weight: 213 lbs 05/13/2022 Weight: 213 lbs - 214 lbs 09/24/2022 Weight: 216 lbs         Attempted call to patient and unable to reach.  Left detailed message per DPR regarding transmission. Transmission reviewed.    CorVue thoracic impedance suggesting normal fluid.   Prescribed: Furosemide 40 mg take 1 tablet daily. Spironolactone 25 mg take 1 tablet daily Farxiga 5 mg take 1 tablet daily   Labs: 09/02/2022 Creatinine 1.28, BUN 6,   Potassium 4.0, Sodium 136, GFR 59 02/06/2022 Creatinine 1.32, BUN 25, Potassium 4.1, Sodium 138, GFR 57 A complete set of results can be found in Results Review.   Recommendations:  Left voice mail with ICM number and encouraged to call if experiencing any fluid symptoms.   Follow-up plan: ICM clinic phone appointment on 12/01/2022   91 day device clinic remote transmission 11/17/2022.     EP/Cardiology Office Visits:  11/12/2022 with Dr Harl Bowie.   Recall 07/03/2023 with Dr. Lovena Le.     Copy of ICM check sent to Dr. Lovena Le.  3 month ICM trend: 12/01/2022.    12-14 Month ICM trend:     [name], RN 10/29/2022 4:04 PM

## 2022-10-30 NOTE — Progress Notes (Signed)
                                                                                                                                                             Patient Name: Roger Lowe MRN: [id number] DOB: 1946-06-28 Referring Physician: June Leap Date of Service: 10/03/2022 [facility], [place]                                                        End Of Treatment Note  Diagnoses: R91.1-Solitary pulmonary nodule  Cancer Staging: 77 y/o man with Stage IA2, NSCLC of the LLL lung   Intent: Curative  Radiation Treatment Dates: 09/23/2022 through 10/03/2022 Site Technique Total Dose (Gy) Dose per Fx (Gy) Completed Fx Beam Energies  Lung, Left: Lung_L IMRT 60/60 12 5/5 6XFFF   Narrative: The patient tolerated radiation therapy relatively well with only modest fatigue.  Plan: The patient will receive a call in about one month from the radiation oncology department. He will continue follow up with Dr. Delton Coombes, in medical oncology as well.  ------------------------------------------------   Tyler Pita, MD [facility]: [phone]  Fax: [phone] [email or web address]  Skype  LinkedIn

## 2022-11-04 ENCOUNTER — Ambulatory Visit
Admission: RE | Admit: 2022-11-04 | Discharge: 2022-11-04 | Disposition: A | Discharge status: Home / Self Care | Payer: Medicare HMO | Source: Ambulatory Visit | Attending: Nurse Practitioner | Admitting: Nurse Practitioner

## 2022-11-04 NOTE — Progress Notes (Signed)
  Radiation Oncology         [phone] ________________________________  Name: Roger Lowe. MRN: [id number]  Date of Service: 11/04/2022  DOB: Feb 08, 1946  Post Treatment Telephone Note  Diagnosis:  77 y/o man with Stage IA2, NSCLC of the LLL lung   Intent: Curative  Radiation Treatment Dates: 09/23/2022 through 10/03/2022 Site Technique Total Dose (Gy) Dose per Fx (Gy) Completed Fx Beam Energies  Lung, Left: Lung_L IMRT 60/60 12 5/5 6XFFF   (as documented in provider EOT note)  The patient was not available for call today. Voicemail unavailable.  The patient has scheduled follow up with his medical oncologist Dr. Delton Coombes for ongoing care, and was encouraged to call if he develops concerns or questions regarding radiation.   Leandra Kern, LPN

## 2022-11-11 DIAGNOSIS — J449 Chronic obstructive pulmonary disease, unspecified: Secondary | ICD-10-CM | POA: Diagnosis not present

## 2022-11-12 ENCOUNTER — Ambulatory Visit: Payer: Medicare HMO | Admitting: Cardiology

## 2022-11-17 ENCOUNTER — Ambulatory Visit (INDEPENDENT_AMBULATORY_CARE_PROVIDER_SITE_OTHER): Payer: Medicare HMO

## 2022-11-17 DIAGNOSIS — I5022 Chronic systolic (congestive) heart failure: Secondary | ICD-10-CM

## 2022-11-17 DIAGNOSIS — I428 Other cardiomyopathies: Secondary | ICD-10-CM

## 2022-11-17 LAB — CUP PACEART REMOTE DEVICE CHECK
Battery Remaining Longevity: 59 mo
Battery Remaining Percentage: 64 %
Battery Voltage: 2.95 V
Brady Statistic AP VP Percent: 1 %
Brady Statistic AP VS Percent: 1 %
Brady Statistic AS VP Percent: 99 %
Brady Statistic AS VS Percent: 1 %
Brady Statistic RA Percent Paced: 1 %
Date Time Interrogation Session: 20240317220413
HighPow Impedance: 66 Ohm
Implantable Lead Connection Status: 753985
Implantable Lead Connection Status: 753985
Implantable Lead Connection Status: 753985
Implantable Lead Implant Date: 20210308
Implantable Lead Implant Date: 20210308
Implantable Lead Implant Date: 20210308
Implantable Lead Location: 753858
Implantable Lead Location: 753859
Implantable Lead Location: 753860
Implantable Lead Model: 7122
Implantable Pulse Generator Implant Date: 20210308
Lead Channel Impedance Value: 390 Ohm
Lead Channel Impedance Value: 500 Ohm
Lead Channel Impedance Value: 840 Ohm
Lead Channel Pacing Threshold Amplitude: 0.625 V
Lead Channel Pacing Threshold Amplitude: 0.75 V
Lead Channel Pacing Threshold Amplitude: 1.25 V
Lead Channel Pacing Threshold Pulse Width: 0.5 ms
Lead Channel Pacing Threshold Pulse Width: 0.5 ms
Lead Channel Pacing Threshold Pulse Width: 0.5 ms
Lead Channel Sensing Intrinsic Amplitude: 11.8 mV
Lead Channel Sensing Intrinsic Amplitude: 4.1 mV
Lead Channel Setting Pacing Amplitude: 1.625
Lead Channel Setting Pacing Amplitude: 2 V
Lead Channel Setting Pacing Amplitude: 2.5 V
Lead Channel Setting Pacing Pulse Width: 0.5 ms
Lead Channel Setting Pacing Pulse Width: 0.5 ms
Lead Channel Setting Sensing Sensitivity: 0.5 mV
Pulse Gen Serial Number: 111019088
Zone Setting Status: 755011

## 2022-11-24 DIAGNOSIS — Z794 Long term (current) use of insulin: Secondary | ICD-10-CM | POA: Diagnosis not present

## 2022-11-24 DIAGNOSIS — E1165 Type 2 diabetes mellitus with hyperglycemia: Secondary | ICD-10-CM | POA: Diagnosis not present

## 2022-11-24 DIAGNOSIS — Z713 Dietary counseling and surveillance: Secondary | ICD-10-CM | POA: Diagnosis not present

## 2022-11-24 DIAGNOSIS — Z7984 Long term (current) use of oral hypoglycemic drugs: Secondary | ICD-10-CM | POA: Diagnosis not present

## 2022-11-24 DIAGNOSIS — Z6829 Body mass index (BMI) 29.0-29.9, adult: Secondary | ICD-10-CM | POA: Diagnosis not present

## 2022-11-24 DIAGNOSIS — Z9981 Dependence on supplemental oxygen: Secondary | ICD-10-CM | POA: Diagnosis not present

## 2022-11-24 DIAGNOSIS — R042 Hemoptysis: Secondary | ICD-10-CM | POA: Diagnosis not present

## 2022-11-29 ENCOUNTER — Other Ambulatory Visit: Payer: Self-pay | Admitting: Pulmonary Disease

## 2022-12-01 ENCOUNTER — Inpatient Hospital Stay: Payer: Medicare HMO | Attending: Internal Medicine

## 2022-12-01 DIAGNOSIS — I5022 Chronic systolic (congestive) heart failure: Secondary | ICD-10-CM

## 2022-12-01 DIAGNOSIS — Z95810 Presence of automatic (implantable) cardiac defibrillator: Secondary | ICD-10-CM | POA: Diagnosis not present

## 2022-12-05 ENCOUNTER — Telehealth: Payer: Self-pay

## 2022-12-05 NOTE — Telephone Encounter (Signed)
Remote ICM transmission received.  Attempted call to patient regarding ICM remote transmission and no answer.  Mail box is full. 

## 2022-12-05 NOTE — Progress Notes (Signed)
EPIC Encounter for ICM Monitoring  Patient Name: Roger Lowe. is a 77 y.o. male Date: 12/05/2022 Primary Care Physican: Benita Stabile, MD Primary Cardiologist: Branch Electrophysiologist: Ladona Ridgel BiV Pacing: >99% 10/09/2021 Weight: 198 lbs 11/20/2021 Weight: 207 lbs 02/13/2022 Office Weight: 213 lbs 05/13/2022 Weight: 213 lbs - 214 lbs 09/24/2022 Weight: 216 lbs         Attempted call to patient and unable to reach.  Left detailed message per DPR regarding transmission. Transmission reviewed.    CorVue thoracic impedance suggesting normal fluid with the exception of possible fluid accumulation from 3/7-3/13 and 3/16-3/25.   Prescribed: Furosemide 40 mg take 1 tablet daily. Spironolactone 25 mg take 1 tablet daily Farxiga 5 mg take 1 tablet daily   Labs: 09/02/2022 Creatinine 1.28, BUN 6,   Potassium 4.0, Sodium 136, GFR 59 02/06/2022 Creatinine 1.32, BUN 25, Potassium 4.1, Sodium 138, GFR 57 A complete set of results can be found in Results Review.   Recommendations:  Left voice mail with ICM number and encouraged to call if experiencing any fluid symptoms.   Follow-up plan: ICM clinic phone appointment on 01/05/2023   91 day device clinic remote transmission 02/16/2023.     EP/Cardiology Office Visits:  01/15/2023 with Dr Wyline Mood.   Recall 07/03/2023 with Dr. Ladona Ridgel.     Copy of ICM check sent to Dr. Ladona Ridgel.  3 month ICM trend: 11/30/2022.    12-14 Month ICM trend:     Karie Soda, RN 12/05/2022 10:21 AM

## 2022-12-16 DIAGNOSIS — J449 Chronic obstructive pulmonary disease, unspecified: Secondary | ICD-10-CM | POA: Diagnosis not present

## 2022-12-24 NOTE — Progress Notes (Signed)
Remote ICD transmission.   

## 2023-01-04 ENCOUNTER — Other Ambulatory Visit: Payer: Self-pay | Admitting: Cardiology

## 2023-01-05 ENCOUNTER — Inpatient Hospital Stay: Payer: Medicare HMO | Attending: Internal Medicine

## 2023-01-05 DIAGNOSIS — Z95810 Presence of automatic (implantable) cardiac defibrillator: Secondary | ICD-10-CM | POA: Diagnosis not present

## 2023-01-05 DIAGNOSIS — I5022 Chronic systolic (congestive) heart failure: Secondary | ICD-10-CM

## 2023-01-07 NOTE — Progress Notes (Signed)
EPIC Encounter for ICM Monitoring  Patient Name: Roger Lowe. is a 77 y.o. male Date: 01/07/2023 Primary Care Physican: Benita Stabile, MD Primary Cardiologist: Branch Electrophysiologist: Ladona Ridgel BiV Pacing: >99% 10/09/2021 Weight: 198 lbs 11/20/2021 Weight: 207 lbs 02/13/2022 Office Weight: 213 lbs 05/13/2022 Weight: 213 lbs - 214 lbs 09/24/2022 Weight: 216 lbs         Spoke with patient and heart failure questions reviewed.  Transmission results reviewed.  Pt asymptomatic for fluid accumulation.  Reports feeling well at this time and voices no complaints.  He has CT scan to check cancer status.    CorVue thoracic impedance suggesting normal fluid levels with the exception of possible fluid accumulation from 4/15-4/27.   Prescribed: Furosemide 40 mg take 1 tablet daily. Spironolactone 25 mg take 1 tablet daily Farxiga 5 mg take 1 tablet daily   Labs: 10/08/2022 Creatinine 1.18, BUN 8,   Potassium 3.7, Sodium 135, GFR >60 09/02/2022 Creatinine 1.28, BUN 6,   Potassium 4.0, Sodium 136, GFR 59 02/06/2022 Creatinine 1.32, BUN 25, Potassium 4.1, Sodium 138, GFR 57 A complete set of results can be found in Results Review.   Recommendations: No changes and encouraged to call if experiencing any fluid symptoms.   Follow-up plan: ICM clinic phone appointment on 02/09/2023   91 day device clinic remote transmission 02/16/2023.     EP/Cardiology Office Visits:  01/15/2023 with Dr Wyline Mood.   Recall 07/03/2023 with Dr. Ladona Ridgel.     Copy of ICM check sent to Dr. Ladona Ridgel.  3 month ICM trend: 01/05/2023.    12-14 Month ICM trend:     Karie Soda, RN 01/07/2023 2:32 PM

## 2023-01-09 ENCOUNTER — Other Ambulatory Visit: Payer: Self-pay | Admitting: Pulmonary Disease

## 2023-01-13 ENCOUNTER — Ambulatory Visit (HOSPITAL_COMMUNITY)
Admission: RE | Admit: 2023-01-13 | Discharge: 2023-01-13 | Disposition: A | Discharge status: Home / Self Care | Payer: Medicare HMO | Source: Ambulatory Visit | Attending: Hematology | Admitting: Hematology

## 2023-01-13 DIAGNOSIS — J479 Bronchiectasis, uncomplicated: Secondary | ICD-10-CM | POA: Diagnosis not present

## 2023-01-13 DIAGNOSIS — C3490 Malignant neoplasm of unspecified part of unspecified bronchus or lung: Secondary | ICD-10-CM

## 2023-01-13 DIAGNOSIS — J432 Centrilobular emphysema: Secondary | ICD-10-CM | POA: Diagnosis not present

## 2023-01-13 LAB — POCT I-STAT CREATININE: Creatinine, Ser: 1.2 mg/dL (ref 0.61–1.24)

## 2023-01-13 MED ORDER — IOHEXOL 300 MG/ML  SOLN
75.0000 mL | Freq: Once | INTRAMUSCULAR | Status: AC | PRN
  Administered 2023-01-13: 75 mL via INTRAVENOUS

## 2023-01-14 DIAGNOSIS — E1165 Type 2 diabetes mellitus with hyperglycemia: Secondary | ICD-10-CM | POA: Diagnosis not present

## 2023-01-14 DIAGNOSIS — E1142 Type 2 diabetes mellitus with diabetic polyneuropathy: Secondary | ICD-10-CM | POA: Diagnosis not present

## 2023-01-14 DIAGNOSIS — H9202 Otalgia, left ear: Secondary | ICD-10-CM | POA: Diagnosis not present

## 2023-01-14 DIAGNOSIS — Z79899 Other long term (current) drug therapy: Secondary | ICD-10-CM | POA: Diagnosis not present

## 2023-01-14 DIAGNOSIS — E1140 Type 2 diabetes mellitus with diabetic neuropathy, unspecified: Secondary | ICD-10-CM | POA: Diagnosis not present

## 2023-01-14 DIAGNOSIS — H9209 Otalgia, unspecified ear: Secondary | ICD-10-CM | POA: Diagnosis not present

## 2023-01-14 DIAGNOSIS — Z794 Long term (current) use of insulin: Secondary | ICD-10-CM | POA: Diagnosis not present

## 2023-01-15 ENCOUNTER — Ambulatory Visit: Payer: Medicare HMO | Attending: Cardiology | Admitting: Cardiology

## 2023-01-15 ENCOUNTER — Encounter: Payer: Self-pay | Admitting: Cardiology

## 2023-01-15 VITALS — BP 140/88 | HR 86 | Ht 72.0 in | Wt 214.0 lb

## 2023-01-15 DIAGNOSIS — I5032 Chronic diastolic (congestive) heart failure: Secondary | ICD-10-CM | POA: Diagnosis not present

## 2023-01-15 DIAGNOSIS — E782 Mixed hyperlipidemia: Secondary | ICD-10-CM

## 2023-01-15 DIAGNOSIS — I2510 Atherosclerotic heart disease of native coronary artery without angina pectoris: Secondary | ICD-10-CM | POA: Diagnosis not present

## 2023-01-15 DIAGNOSIS — I10 Essential (primary) hypertension: Secondary | ICD-10-CM | POA: Diagnosis not present

## 2023-01-15 DIAGNOSIS — N1831 Chronic kidney disease, stage 3a: Secondary | ICD-10-CM | POA: Diagnosis not present

## 2023-01-15 DIAGNOSIS — M109 Gout, unspecified: Secondary | ICD-10-CM | POA: Diagnosis not present

## 2023-01-15 DIAGNOSIS — E1165 Type 2 diabetes mellitus with hyperglycemia: Secondary | ICD-10-CM | POA: Diagnosis not present

## 2023-01-15 MED ORDER — HYDRALAZINE HCL 50 MG PO TABS: 75.0000 mg | ORAL_TABLET | Freq: Three times a day (TID) | ORAL | 3 refills | Status: DC
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
## 2023-01-15 NOTE — Patient Instructions (Addendum)
Medication Instructions:  Your physician has recommended you make the following change in your medication:   -Increase Hydralazine to 75 mg tablet three times daily.   *If you need a refill on your cardiac medications before your next appointment, please call your pharmacy*   Lab Work: None If you have labs (blood work) drawn today and your tests are completely normal, you will receive your results only by: MyChart Message (if you have MyChart) OR A paper copy in the mail If you have any lab test that is abnormal or we need to change your treatment, we will call you to review the results.   Testing/Procedures: None   Follow-Up: At [facility], you and your health needs are our priority.  As part of our continuing mission to provide you with exceptional heart care, we have created designated Provider Care Teams.  These Care Teams include your primary Cardiologist (physician) and Advanced Practice Providers (APPs -  Physician Assistants and Nurse Practitioners) who all work together to provide you with the care you need, when you need it.  We recommend signing up for the patient portal called "MyChart".  Sign up information is provided on this After Visit Summary.  MyChart is used to connect with patients for Virtual Visits (Telemedicine).  Patients are able to view lab/test results, encounter notes, upcoming appointments, etc.  Non-urgent messages can be sent to your provider as well.   To learn more about what you can do with MyChart, go to [email or web address].    Your next appointment:   6 month(s)  Provider:   Dina Rich, MD    Other Instructions

## 2023-01-19 DIAGNOSIS — J449 Chronic obstructive pulmonary disease, unspecified: Secondary | ICD-10-CM | POA: Diagnosis not present

## 2023-01-20 ENCOUNTER — Inpatient Hospital Stay: Payer: Medicare HMO | Attending: Hematology | Admitting: Hematology

## 2023-01-20 VITALS — BP 140/86 | HR 93 | Temp 98.3°F | Resp 20 | Ht 73.0 in | Wt 217.8 lb

## 2023-01-20 DIAGNOSIS — Z08 Encounter for follow-up examination after completed treatment for malignant neoplasm: Secondary | ICD-10-CM | POA: Insufficient documentation

## 2023-01-20 DIAGNOSIS — C3490 Malignant neoplasm of unspecified part of unspecified bronchus or lung: Secondary | ICD-10-CM

## 2023-01-20 DIAGNOSIS — Z85118 Personal history of other malignant neoplasm of bronchus and lung: Secondary | ICD-10-CM | POA: Diagnosis not present

## 2023-01-20 DIAGNOSIS — Z7901 Long term (current) use of anticoagulants: Secondary | ICD-10-CM | POA: Insufficient documentation

## 2023-01-20 DIAGNOSIS — Z86718 Personal history of other venous thrombosis and embolism: Secondary | ICD-10-CM | POA: Diagnosis not present

## 2023-01-20 DIAGNOSIS — Z86711 Personal history of pulmonary embolism: Secondary | ICD-10-CM | POA: Insufficient documentation

## 2023-01-20 NOTE — Patient Instructions (Addendum)
[facility] at [facility] Discharge Instructions   You were seen and examined today by Dr. Ellin Saba.  He reviewed the results of your CT chest. 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 There is a lymph node that has grown slightly. Dr. Kirtland Bouchard recommends a repeat scan in 3 months to monitor this. It could be a reaction to radiation treatment.   We will see you back in 3 months. We will repeat the CT scan and lab work prior to your next visit.    Thank you for choosing [facility] at [facility] to provide your oncology and hematology care.  To afford each patient quality time with our provider, please arrive at least 15 minutes before your scheduled appointment time.   If you have a lab appointment with the Cancer Center please come in thru the Main Entrance and check in at the main information desk.  You need to re-schedule your appointment should you arrive 10 or more minutes late.  We strive to give you quality time with our providers, and arriving late affects you and other patients whose appointments are after yours.  Also, if you no show three or more times for appointments you may be dismissed from the clinic at the providers discretion.     Again, thank you for choosing [facility].  Our hope is that these requests will decrease the amount of time that you wait before being seen by our physicians.       _____________________________________________________________  Should you have questions after your visit to [facility], please contact our office at [phone] and follow the prompts.  Our office hours are 8:00 a.m. and 4:30 p.m. Monday - Friday.  Please note that voicemails left after 4:00 p.m. may not be returned until the following business day.  We are closed weekends and major holidays.  You do have access to a nurse 24-7, just call the main number to the clinic [phone] and do not press  any options, hold on the line and a nurse will answer the phone.    For prescription refill requests, have your pharmacy contact our office and allow 72 hours.    Due to Covid, you will need to wear a mask upon entering the hospital. If you do not have a mask, a mask will be given to you at the Main Entrance upon arrival. For doctor visits, patients may have 1 support person age 41 or older with them. For treatment visits, patients can not have anyone with them due to social distancing guidelines and our immunocompromised population.
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
## 2023-01-21 DIAGNOSIS — E1122 Type 2 diabetes mellitus with diabetic chronic kidney disease: Secondary | ICD-10-CM | POA: Diagnosis not present

## 2023-01-21 DIAGNOSIS — E1165 Type 2 diabetes mellitus with hyperglycemia: Secondary | ICD-10-CM | POA: Diagnosis not present

## 2023-01-21 DIAGNOSIS — K219 Gastro-esophageal reflux disease without esophagitis: Secondary | ICD-10-CM | POA: Diagnosis not present

## 2023-01-21 DIAGNOSIS — N1831 Chronic kidney disease, stage 3a: Secondary | ICD-10-CM | POA: Diagnosis not present

## 2023-01-21 DIAGNOSIS — E782 Mixed hyperlipidemia: Secondary | ICD-10-CM | POA: Diagnosis not present

## 2023-01-21 DIAGNOSIS — C3490 Malignant neoplasm of unspecified part of unspecified bronchus or lung: Secondary | ICD-10-CM | POA: Diagnosis not present

## 2023-01-21 DIAGNOSIS — I2510 Atherosclerotic heart disease of native coronary artery without angina pectoris: Secondary | ICD-10-CM | POA: Diagnosis not present

## 2023-01-21 DIAGNOSIS — R059 Cough, unspecified: Secondary | ICD-10-CM | POA: Diagnosis not present

## 2023-01-21 DIAGNOSIS — I5022 Chronic systolic (congestive) heart failure: Secondary | ICD-10-CM | POA: Diagnosis not present

## 2023-01-27 DIAGNOSIS — E1065 Type 1 diabetes mellitus with hyperglycemia: Secondary | ICD-10-CM | POA: Diagnosis not present

## 2023-02-09 ENCOUNTER — Other Ambulatory Visit: Payer: Self-pay | Admitting: Pulmonary Disease

## 2023-02-09 ENCOUNTER — Inpatient Hospital Stay: Payer: Medicare HMO | Attending: Internal Medicine

## 2023-02-09 DIAGNOSIS — I5022 Chronic systolic (congestive) heart failure: Secondary | ICD-10-CM

## 2023-02-09 DIAGNOSIS — Z95810 Presence of automatic (implantable) cardiac defibrillator: Secondary | ICD-10-CM | POA: Diagnosis not present

## 2023-02-10 DIAGNOSIS — J449 Chronic obstructive pulmonary disease, unspecified: Secondary | ICD-10-CM | POA: Diagnosis not present

## 2023-02-11 NOTE — Progress Notes (Signed)
EPIC Encounter for ICM Monitoring  Patient Name: Roger Lowe. is a 77 y.o. male Date: 02/11/2023 Primary Care Physican: Benita Stabile, MD Primary Cardiologist: Branch Electrophysiologist: Ladona Ridgel BiV Pacing: >99% 05/13/2022 Weight: 213 lbs - 214 lbs 09/24/2022 Weight: 216 lbs 02/11/2023 Weight: 220 lbs         Spoke with patient and heart failure questions reviewed.  Transmission results reviewed.  Pt asymptomatic for fluid accumulation.  Reports feeling well at this time and voices no complaints.  He was going on vacation for birthday.    CorVue thoracic impedance suggesting normal fluid levels.   Prescribed: Furosemide 40 mg take 1 tablet daily. Spironolactone 25 mg take 1 tablet daily Farxiga 5 mg take 1 tablet daily   Labs: 10/08/2022 Creatinine 1.18, BUN 8,   Potassium 3.7, Sodium 135, GFR >60 09/02/2022 Creatinine 1.28, BUN 6,   Potassium 4.0, Sodium 136, GFR 59 02/06/2022 Creatinine 1.32, BUN 25, Potassium 4.1, Sodium 138, GFR 57 A complete set of results can be found in Results Review.   Recommendations:  No changes and encouraged to call if experiencing any fluid symptoms.   Follow-up plan: ICM clinic phone appointment on 03/16/2023   91 day device clinic remote transmission 02/16/2023.     EP/Cardiology Office Visits:  07/17/2023 with Dr Wyline Mood.   Recall 07/03/2023 with Dr. Ladona Ridgel.     Copy of ICM check sent to Dr. Ladona Ridgel.  3 month ICM trend: 02/08/2023.    12-14 Month ICM trend:     Karie Soda, RN 02/11/2023 8:14 AM

## 2023-02-14 DIAGNOSIS — E119 Type 2 diabetes mellitus without complications: Secondary | ICD-10-CM | POA: Diagnosis not present

## 2023-02-16 ENCOUNTER — Ambulatory Visit (INDEPENDENT_AMBULATORY_CARE_PROVIDER_SITE_OTHER): Payer: Medicare HMO

## 2023-02-16 DIAGNOSIS — H6022 Malignant otitis externa, left ear: Secondary | ICD-10-CM | POA: Diagnosis not present

## 2023-02-16 DIAGNOSIS — I5022 Chronic systolic (congestive) heart failure: Secondary | ICD-10-CM | POA: Diagnosis not present

## 2023-02-16 DIAGNOSIS — I959 Hypotension, unspecified: Secondary | ICD-10-CM | POA: Diagnosis not present

## 2023-02-16 DIAGNOSIS — J449 Chronic obstructive pulmonary disease, unspecified: Secondary | ICD-10-CM | POA: Diagnosis not present

## 2023-02-16 DIAGNOSIS — I10 Essential (primary) hypertension: Secondary | ICD-10-CM | POA: Diagnosis not present

## 2023-02-16 DIAGNOSIS — S0990XA Unspecified injury of head, initial encounter: Secondary | ICD-10-CM | POA: Diagnosis not present

## 2023-02-16 DIAGNOSIS — H7092 Unspecified mastoiditis, left ear: Secondary | ICD-10-CM | POA: Diagnosis not present

## 2023-02-16 DIAGNOSIS — R296 Repeated falls: Secondary | ICD-10-CM | POA: Diagnosis not present

## 2023-02-16 DIAGNOSIS — H6092 Unspecified otitis externa, left ear: Secondary | ICD-10-CM | POA: Diagnosis not present

## 2023-02-16 DIAGNOSIS — I428 Other cardiomyopathies: Secondary | ICD-10-CM

## 2023-02-16 DIAGNOSIS — R9431 Abnormal electrocardiogram [ECG] [EKG]: Secondary | ICD-10-CM | POA: Diagnosis not present

## 2023-02-16 DIAGNOSIS — H60502 Unspecified acute noninfective otitis externa, left ear: Secondary | ICD-10-CM | POA: Diagnosis not present

## 2023-02-16 DIAGNOSIS — W19XXXA Unspecified fall, initial encounter: Secondary | ICD-10-CM | POA: Diagnosis not present

## 2023-02-16 DIAGNOSIS — I82409 Acute embolism and thrombosis of unspecified deep veins of unspecified lower extremity: Secondary | ICD-10-CM | POA: Diagnosis not present

## 2023-02-16 DIAGNOSIS — H7102 Cholesteatoma of attic, left ear: Secondary | ICD-10-CM | POA: Diagnosis not present

## 2023-02-16 DIAGNOSIS — Z85118 Personal history of other malignant neoplasm of bronchus and lung: Secondary | ICD-10-CM | POA: Diagnosis not present

## 2023-02-16 DIAGNOSIS — H6091 Unspecified otitis externa, right ear: Secondary | ICD-10-CM | POA: Diagnosis not present

## 2023-02-16 DIAGNOSIS — E119 Type 2 diabetes mellitus without complications: Secondary | ICD-10-CM | POA: Diagnosis not present

## 2023-02-16 DIAGNOSIS — H7191 Unspecified cholesteatoma, right ear: Secondary | ICD-10-CM | POA: Diagnosis not present

## 2023-02-16 DIAGNOSIS — E785 Hyperlipidemia, unspecified: Secondary | ICD-10-CM | POA: Diagnosis not present

## 2023-02-16 DIAGNOSIS — H7192 Unspecified cholesteatoma, left ear: Secondary | ICD-10-CM | POA: Diagnosis not present

## 2023-02-16 DIAGNOSIS — J320 Chronic maxillary sinusitis: Secondary | ICD-10-CM | POA: Diagnosis not present

## 2023-02-16 DIAGNOSIS — Z86718 Personal history of other venous thrombosis and embolism: Secondary | ICD-10-CM | POA: Diagnosis not present

## 2023-02-17 DIAGNOSIS — H6022 Malignant otitis externa, left ear: Secondary | ICD-10-CM | POA: Diagnosis not present

## 2023-02-17 DIAGNOSIS — H7191 Unspecified cholesteatoma, right ear: Secondary | ICD-10-CM | POA: Diagnosis not present

## 2023-02-17 DIAGNOSIS — H6091 Unspecified otitis externa, right ear: Secondary | ICD-10-CM | POA: Diagnosis not present

## 2023-02-17 DIAGNOSIS — H7090 Unspecified mastoiditis, unspecified ear: Secondary | ICD-10-CM | POA: Diagnosis not present

## 2023-02-17 DIAGNOSIS — J320 Chronic maxillary sinusitis: Secondary | ICD-10-CM | POA: Diagnosis not present

## 2023-02-17 LAB — CUP PACEART REMOTE DEVICE CHECK
Battery Remaining Longevity: 55 mo
Battery Remaining Percentage: 61 %
Battery Voltage: 2.95 V
Brady Statistic AP VP Percent: 1 %
Brady Statistic AP VS Percent: 1 %
Brady Statistic AS VP Percent: 99 %
Brady Statistic AS VS Percent: 1 %
Brady Statistic RA Percent Paced: 1 %
Date Time Interrogation Session: 20240616221531
HighPow Impedance: 70 Ohm
Implantable Lead Connection Status: 753985
Implantable Lead Connection Status: 753985
Implantable Lead Connection Status: 753985
Implantable Lead Implant Date: 20210308
Implantable Lead Implant Date: 20210308
Implantable Lead Implant Date: 20210308
Implantable Lead Location: 753858
Implantable Lead Location: 753859
Implantable Lead Location: 753860
Implantable Lead Model: 7122
Implantable Pulse Generator Implant Date: 20210308
Lead Channel Impedance Value: 380 Ohm
Lead Channel Impedance Value: 510 Ohm
Lead Channel Impedance Value: 790 Ohm
Lead Channel Pacing Threshold Amplitude: 0.625 V
Lead Channel Pacing Threshold Amplitude: 0.75 V
Lead Channel Pacing Threshold Amplitude: 1.25 V
Lead Channel Pacing Threshold Pulse Width: 0.5 ms
Lead Channel Pacing Threshold Pulse Width: 0.5 ms
Lead Channel Pacing Threshold Pulse Width: 0.5 ms
Lead Channel Sensing Intrinsic Amplitude: 11.8 mV
Lead Channel Sensing Intrinsic Amplitude: 3.8 mV
Lead Channel Setting Pacing Amplitude: 1.625
Lead Channel Setting Pacing Amplitude: 2 V
Lead Channel Setting Pacing Amplitude: 2.5 V
Lead Channel Setting Pacing Pulse Width: 0.5 ms
Lead Channel Setting Pacing Pulse Width: 0.5 ms
Lead Channel Setting Sensing Sensitivity: 0.5 mV
Pulse Gen Serial Number: 111019088
Zone Setting Status: 755011

## 2023-02-24 ENCOUNTER — Encounter: Payer: Self-pay | Admitting: Urology

## 2023-02-24 ENCOUNTER — Ambulatory Visit (INDEPENDENT_AMBULATORY_CARE_PROVIDER_SITE_OTHER): Payer: Medicare HMO | Admitting: Urology

## 2023-02-24 VITALS — BP 113/71 | HR 112 | Temp 97.4°F

## 2023-02-24 DIAGNOSIS — R972 Elevated prostate specific antigen [PSA]: Secondary | ICD-10-CM | POA: Diagnosis not present

## 2023-02-24 DIAGNOSIS — R3989 Other symptoms and signs involving the genitourinary system: Secondary | ICD-10-CM | POA: Diagnosis not present

## 2023-02-24 DIAGNOSIS — R399 Unspecified symptoms and signs involving the genitourinary system: Secondary | ICD-10-CM

## 2023-02-24 DIAGNOSIS — N401 Enlarged prostate with lower urinary tract symptoms: Secondary | ICD-10-CM

## 2023-02-24 LAB — MICROSCOPIC EXAMINATION: Bacteria, UA: NONE SEEN

## 2023-02-24 LAB — URINALYSIS, ROUTINE W REFLEX MICROSCOPIC
Bilirubin, UA: NEGATIVE
Leukocytes,UA: NEGATIVE
Nitrite, UA: NEGATIVE
RBC, UA: NEGATIVE
Specific Gravity, UA: 1.03 (ref 1.005–1.030)
Urobilinogen, Ur: 2 mg/dL — ABNORMAL HIGH (ref 0.2–1.0)
pH, UA: 5.5 (ref 5.0–7.5)

## 2023-02-24 LAB — BLADDER SCAN AMB NON-IMAGING

## 2023-02-24 MED ORDER — AMOXICILLIN-POT CLAVULANATE 875-125 MG PO TABS
1.0000 | ORAL_TABLET | Freq: Two times a day (BID) | ORAL | 0 refills | Status: DC
Start: 2023-02-24 — End: 2023-04-22

## 2023-02-24 MED ORDER — TAMSULOSIN HCL 0.4 MG PO CAPS
0.4000 mg | ORAL_CAPSULE | Freq: Every day | ORAL | 11 refills | Status: DC
Start: 2023-02-24 — End: 2024-02-09
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
## 2023-02-24 NOTE — Addendum Note (Signed)
Addended by: Tilden Dome on: 02/24/2023 03:48 PM   Modules accepted: Orders

## 2023-02-26 DIAGNOSIS — E1065 Type 1 diabetes mellitus with hyperglycemia: Secondary | ICD-10-CM | POA: Diagnosis not present

## 2023-02-27 ENCOUNTER — Other Ambulatory Visit: Payer: Self-pay | Admitting: Urology

## 2023-02-27 DIAGNOSIS — B3741 Candidal cystitis and urethritis: Secondary | ICD-10-CM

## 2023-02-27 DIAGNOSIS — B3749 Other urogenital candidiasis: Secondary | ICD-10-CM

## 2023-02-27 LAB — URINE CULTURE

## 2023-02-27 MED ORDER — FLUCONAZOLE 200 MG PO TABS
200.0000 mg | ORAL_TABLET | Freq: Every day | ORAL | 0 refills | Status: AC
Start: 2023-02-27 — End: 2023-03-13

## 2023-02-27 NOTE — Progress Notes (Signed)
Spoke with patient via phone regarding finding of candiduria. Per UpToDate recommendation will treat with Diflucan 200 mg daily x14 days. He was advised to stop antibiotic. Pt verbalized understanding and agreement.

## 2023-03-03 DIAGNOSIS — Z79899 Other long term (current) drug therapy: Secondary | ICD-10-CM | POA: Diagnosis not present

## 2023-03-03 DIAGNOSIS — G894 Chronic pain syndrome: Secondary | ICD-10-CM | POA: Diagnosis not present

## 2023-03-03 DIAGNOSIS — I10 Essential (primary) hypertension: Secondary | ICD-10-CM | POA: Diagnosis not present

## 2023-03-03 DIAGNOSIS — R296 Repeated falls: Secondary | ICD-10-CM | POA: Diagnosis not present

## 2023-03-03 DIAGNOSIS — H7192 Unspecified cholesteatoma, left ear: Secondary | ICD-10-CM | POA: Diagnosis not present

## 2023-03-03 DIAGNOSIS — C3490 Malignant neoplasm of unspecified part of unspecified bronchus or lung: Secondary | ICD-10-CM | POA: Diagnosis not present

## 2023-03-03 DIAGNOSIS — M542 Cervicalgia: Secondary | ICD-10-CM | POA: Diagnosis not present

## 2023-03-03 DIAGNOSIS — Z87891 Personal history of nicotine dependence: Secondary | ICD-10-CM | POA: Diagnosis not present

## 2023-03-06 NOTE — Progress Notes (Signed)
Remote ICD transmission.   

## 2023-03-09 ENCOUNTER — Ambulatory Visit (HOSPITAL_COMMUNITY)
Admission: RE | Admit: 2023-03-09 | Discharge: 2023-03-09 | Disposition: A | Discharge status: Home / Self Care | Payer: Medicare HMO | Source: Ambulatory Visit | Attending: Urology | Admitting: Urology

## 2023-03-09 DIAGNOSIS — N400 Enlarged prostate without lower urinary tract symptoms: Secondary | ICD-10-CM | POA: Diagnosis not present

## 2023-03-09 DIAGNOSIS — R3989 Other symptoms and signs involving the genitourinary system: Secondary | ICD-10-CM | POA: Insufficient documentation

## 2023-03-09 DIAGNOSIS — N189 Chronic kidney disease, unspecified: Secondary | ICD-10-CM | POA: Diagnosis not present

## 2023-03-09 DIAGNOSIS — N281 Cyst of kidney, acquired: Secondary | ICD-10-CM | POA: Diagnosis not present

## 2023-03-13 LAB — SPECIMEN STATUS REPORT

## 2023-03-13 LAB — ORGANISM IDENTIFICATION, YEAST

## 2023-03-16 ENCOUNTER — Inpatient Hospital Stay: Payer: Medicare HMO | Attending: Internal Medicine

## 2023-03-16 DIAGNOSIS — I5022 Chronic systolic (congestive) heart failure: Secondary | ICD-10-CM

## 2023-03-16 DIAGNOSIS — Z95810 Presence of automatic (implantable) cardiac defibrillator: Secondary | ICD-10-CM | POA: Diagnosis not present

## 2023-03-16 NOTE — Progress Notes (Signed)
Please let pt know RUS showed no acute findings. He has multiple simple (benign) renal cysts on the left kidney and enlarged prostate. No GU stones, masses, or hydronephrosis. Thanks.
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
## 2023-03-18 ENCOUNTER — Ambulatory Visit: Payer: Medicare HMO | Admitting: Urology

## 2023-03-18 ENCOUNTER — Encounter: Payer: Self-pay | Admitting: Urology

## 2023-03-18 VITALS — BP 137/90 | HR 98 | Temp 98.2°F

## 2023-03-18 DIAGNOSIS — B3741 Candidal cystitis and urethritis: Secondary | ICD-10-CM

## 2023-03-18 DIAGNOSIS — N401 Enlarged prostate with lower urinary tract symptoms: Secondary | ICD-10-CM

## 2023-03-18 DIAGNOSIS — R972 Elevated prostate specific antigen [PSA]: Secondary | ICD-10-CM

## 2023-03-18 LAB — BLADDER SCAN AMB NON-IMAGING: Scan Result: 0

## 2023-03-18 LAB — URINALYSIS, ROUTINE W REFLEX MICROSCOPIC
Bilirubin, UA: NEGATIVE
Ketones, UA: NEGATIVE
Leukocytes,UA: NEGATIVE
Nitrite, UA: NEGATIVE
RBC, UA: NEGATIVE
Specific Gravity, UA: 1.015 (ref 1.005–1.030)
Urobilinogen, Ur: 1 mg/dL (ref 0.2–1.0)
pH, UA: 6 (ref 5.0–7.5)

## 2023-03-19 ENCOUNTER — Ambulatory Visit (HOSPITAL_COMMUNITY)
Admission: RE | Admit: 2023-03-19 | Discharge: 2023-03-19 | Disposition: A | Discharge status: Home / Self Care | Payer: Medicare HMO | Source: Ambulatory Visit | Attending: Otolaryngology | Admitting: Otolaryngology

## 2023-03-19 ENCOUNTER — Other Ambulatory Visit: Payer: Self-pay | Admitting: Urology

## 2023-03-19 ENCOUNTER — Other Ambulatory Visit (HOSPITAL_COMMUNITY): Payer: Self-pay | Admitting: Otolaryngology

## 2023-03-19 DIAGNOSIS — R972 Elevated prostate specific antigen [PSA]: Secondary | ICD-10-CM

## 2023-03-19 DIAGNOSIS — H9212 Otorrhea, left ear: Secondary | ICD-10-CM | POA: Diagnosis not present

## 2023-03-19 DIAGNOSIS — N401 Enlarged prostate with lower urinary tract symptoms: Secondary | ICD-10-CM

## 2023-03-19 DIAGNOSIS — H7492 Unspecified disorder of left middle ear and mastoid: Secondary | ICD-10-CM | POA: Insufficient documentation

## 2023-03-19 DIAGNOSIS — H6042 Cholesteatoma of left external ear: Secondary | ICD-10-CM | POA: Diagnosis not present

## 2023-03-19 DIAGNOSIS — H9190 Unspecified hearing loss, unspecified ear: Secondary | ICD-10-CM | POA: Diagnosis not present

## 2023-03-19 DIAGNOSIS — M799 Soft tissue disorder, unspecified: Secondary | ICD-10-CM | POA: Diagnosis not present

## 2023-03-19 LAB — PSA: Prostate Specific Ag, Serum: 6.4 ng/mL — ABNORMAL HIGH (ref 0.0–4.0)

## 2023-03-19 NOTE — Progress Notes (Signed)
Please let pt know PSA is a little higher than before - may be related to recent infection. Advise rechecking PSA in 3 months.

## 2023-03-22 ENCOUNTER — Other Ambulatory Visit: Payer: Self-pay | Admitting: Pulmonary Disease

## 2023-03-22 LAB — URINE CULTURE

## 2023-03-22 NOTE — Progress Notes (Signed)
EPIC Encounter for ICM Monitoring  Patient Name: Roger Lowe. is a 77 y.o. male Date: 03/22/2023 Primary Care Physican: Benita Stabile, MD Primary Cardiologist: Branch Electrophysiologist: Ladona Ridgel BiV Pacing: >99% 05/13/2022 Weight: 213 lbs - 214 lbs 09/24/2022 Weight: 216 lbs 02/11/2023 Weight: 220 lbs         Transmission results reviewed.     CorVue thoracic impedance suggesting normal fluid levels.   Prescribed: Furosemide 40 mg take 1 tablet daily. Spironolactone 25 mg take 1 tablet daily Farxiga 5 mg take 1 tablet daily   Labs: 10/08/2022 Creatinine 1.18, BUN 8,   Potassium 3.7, Sodium 135, GFR >60 09/02/2022 Creatinine 1.28, BUN 6,   Potassium 4.0, Sodium 136, GFR 59 02/06/2022 Creatinine 1.32, BUN 25, Potassium 4.1, Sodium 138, GFR 57 A complete set of results can be found in Results Review.   Recommendations:  No changes.   Follow-up plan: ICM clinic phone appointment on 04/20/2023   91 day device clinic remote transmission 05/18/2023.     EP/Cardiology Office Visits:  07/17/2023 with Dr Wyline Mood.   Recall 07/03/2023 with Dr. Ladona Ridgel.     Copy of ICM check sent to Dr. Ladona Ridgel.  3 month ICM trend: 03/15/2023.    12-14 Month ICM trend:     Karie Soda, RN 03/22/2023 8:57 AM

## 2023-03-23 NOTE — Progress Notes (Signed)
Please notify patient: Negative urine culture, no further treated indicated at this time. Thanks.

## 2023-03-26 DIAGNOSIS — J449 Chronic obstructive pulmonary disease, unspecified: Secondary | ICD-10-CM | POA: Diagnosis not present

## 2023-03-28 DIAGNOSIS — E1065 Type 1 diabetes mellitus with hyperglycemia: Secondary | ICD-10-CM | POA: Diagnosis not present

## 2023-03-30 DIAGNOSIS — H90A32 Mixed conductive and sensorineural hearing loss, unilateral, left ear with restricted hearing on the contralateral side: Secondary | ICD-10-CM | POA: Diagnosis not present

## 2023-03-30 DIAGNOSIS — H90A21 Sensorineural hearing loss, unilateral, right ear, with restricted hearing on the contralateral side: Secondary | ICD-10-CM | POA: Diagnosis not present

## 2023-03-31 DIAGNOSIS — H9113 Presbycusis, bilateral: Secondary | ICD-10-CM | POA: Diagnosis not present

## 2023-03-31 DIAGNOSIS — H7192 Unspecified cholesteatoma, left ear: Secondary | ICD-10-CM | POA: Diagnosis not present

## 2023-04-03 ENCOUNTER — Other Ambulatory Visit: Payer: Self-pay | Admitting: Pulmonary Disease

## 2023-04-03 ENCOUNTER — Telehealth: Payer: Self-pay

## 2023-04-03 NOTE — Telephone Encounter (Signed)
Patient is made aware and voiced understanding " PSA is a little higher than before - may be related to recent infection. Advise rechecking PSA in 3 months."

## 2023-04-15 ENCOUNTER — Inpatient Hospital Stay: Payer: Medicare HMO | Attending: Hematology

## 2023-04-15 ENCOUNTER — Ambulatory Visit (HOSPITAL_COMMUNITY)
Admission: RE | Admit: 2023-04-15 | Discharge: 2023-04-15 | Disposition: A | Discharge status: Home / Self Care | Payer: Medicare HMO | Source: Ambulatory Visit | Attending: Hematology | Admitting: Hematology

## 2023-04-15 DIAGNOSIS — I700 Atherosclerosis of aorta: Secondary | ICD-10-CM | POA: Diagnosis not present

## 2023-04-15 DIAGNOSIS — C3490 Malignant neoplasm of unspecified part of unspecified bronchus or lung: Secondary | ICD-10-CM | POA: Diagnosis not present

## 2023-04-15 DIAGNOSIS — Z08 Encounter for follow-up examination after completed treatment for malignant neoplasm: Secondary | ICD-10-CM | POA: Insufficient documentation

## 2023-04-15 DIAGNOSIS — Z85118 Personal history of other malignant neoplasm of bronchus and lung: Secondary | ICD-10-CM | POA: Insufficient documentation

## 2023-04-15 DIAGNOSIS — Z86711 Personal history of pulmonary embolism: Secondary | ICD-10-CM | POA: Insufficient documentation

## 2023-04-15 DIAGNOSIS — Z7901 Long term (current) use of anticoagulants: Secondary | ICD-10-CM | POA: Insufficient documentation

## 2023-04-15 DIAGNOSIS — R590 Localized enlarged lymph nodes: Secondary | ICD-10-CM | POA: Diagnosis not present

## 2023-04-15 DIAGNOSIS — Z86718 Personal history of other venous thrombosis and embolism: Secondary | ICD-10-CM | POA: Insufficient documentation

## 2023-04-15 LAB — COMPREHENSIVE METABOLIC PANEL
ALT: 16 U/L (ref 0–44)
AST: 20 U/L (ref 15–41)
Albumin: 3.6 g/dL (ref 3.5–5.0)
Alkaline Phosphatase: 115 U/L (ref 38–126)
Anion gap: 7 (ref 5–15)
BUN: 8 mg/dL (ref 8–23)
CO2: 26 mmol/L (ref 22–32)
Calcium: 9.2 mg/dL (ref 8.9–10.3)
Chloride: 102 mmol/L (ref 98–111)
Creatinine, Ser: 1.1 mg/dL (ref 0.61–1.24)
GFR, Estimated: >60 mL/min (ref >60)
Glucose, Bld: 110 mg/dL — ABNORMAL HIGH (ref 70–99)
Potassium: 3.9 mmol/L (ref 3.5–5.1)
Sodium: 135 mmol/L (ref 135–145)
Total Bilirubin: 0.5 mg/dL (ref 0.3–1.2)
Total Protein: 7.9 g/dL (ref 6.5–8.1)

## 2023-04-15 LAB — CBC WITH DIFFERENTIAL/PLATELET
Abs Immature Granulocytes: 0.02 10*3/uL (ref 0.00–0.07)
Basophils Absolute: 0 10*3/uL (ref 0.0–0.1)
Basophils Relative: 1 %
Eosinophils Absolute: 0.2 10*3/uL (ref 0.0–0.5)
Eosinophils Relative: 3 %
HCT: 46 % (ref 39.0–52.0)
Hemoglobin: 14.6 g/dL (ref 13.0–17.0)
Immature Granulocytes: 0 %
Lymphocytes Relative: 26 %
Lymphs Abs: 1.7 10*3/uL (ref 0.7–4.0)
MCH: 29.9 pg (ref 26.0–34.0)
MCHC: 31.7 g/dL (ref 30.0–36.0)
MCV: 94.1 fL (ref 80.0–100.0)
Monocytes Absolute: 0.7 10*3/uL (ref 0.1–1.0)
Monocytes Relative: 12 %
Neutro Abs: 3.8 10*3/uL (ref 1.7–7.7)
Neutrophils Relative %: 58 %
Platelets: 193 10*3/uL (ref 150–400)
RBC: 4.89 MIL/uL (ref 4.22–5.81)
RDW: 14.8 % (ref 11.5–15.5)
WBC: 6.4 10*3/uL (ref 4.0–10.5)
nRBC: 0 % (ref 0.0–0.2)

## 2023-04-15 MED ORDER — IOHEXOL 300 MG/ML  SOLN
80.0000 mL | Freq: Once | INTRAMUSCULAR | Status: AC | PRN
  Administered 2023-04-15: 80 mL via INTRAVENOUS

## 2023-04-20 ENCOUNTER — Inpatient Hospital Stay: Payer: Medicare HMO | Attending: Internal Medicine

## 2023-04-20 DIAGNOSIS — I5022 Chronic systolic (congestive) heart failure: Secondary | ICD-10-CM

## 2023-04-20 DIAGNOSIS — Z95810 Presence of automatic (implantable) cardiac defibrillator: Secondary | ICD-10-CM
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
## 2023-04-22 ENCOUNTER — Inpatient Hospital Stay: Payer: Medicare HMO | Admitting: Hematology

## 2023-04-22 VITALS — BP 168/94 | HR 100 | Temp 97.5°F | Resp 18 | Ht 73.0 in | Wt 213.3 lb

## 2023-04-22 DIAGNOSIS — Z7901 Long term (current) use of anticoagulants: Secondary | ICD-10-CM | POA: Diagnosis not present

## 2023-04-22 DIAGNOSIS — Z08 Encounter for follow-up examination after completed treatment for malignant neoplasm: Secondary | ICD-10-CM | POA: Diagnosis not present

## 2023-04-22 DIAGNOSIS — Z86711 Personal history of pulmonary embolism: Secondary | ICD-10-CM | POA: Diagnosis not present

## 2023-04-22 DIAGNOSIS — Z85118 Personal history of other malignant neoplasm of bronchus and lung: Secondary | ICD-10-CM | POA: Diagnosis not present

## 2023-04-22 DIAGNOSIS — C3490 Malignant neoplasm of unspecified part of unspecified bronchus or lung: Secondary | ICD-10-CM

## 2023-04-22 DIAGNOSIS — Z86718 Personal history of other venous thrombosis and embolism: Secondary | ICD-10-CM | POA: Diagnosis not present

## 2023-04-22 NOTE — Patient Instructions (Addendum)
[facility] at [facility] Discharge Instructions   You were seen and examined today by Dr. Ellin Saba.  He reviewed the results of your lab work which are normal/stable.   He reviewed the results of your CT scan. There is a new spot in the right lower lobe of your lung. This is favored to be an inflammatory process (not cancer). We will monitor this closely.   We will see you back in   Return as scheduled.    Thank you for choosing [facility] at [facility] to provide your oncology and hematology care.  To afford each patient quality time with our provider, please arrive at least 15 minutes before your scheduled appointment time.   If you have a lab appointment with the Cancer Center please come in thru the Main Entrance and check in at the main information desk.  You need to re-schedule your appointment should you arrive 10 or more minutes late.  We strive to give you quality time with our providers, and arriving late affects you and other patients whose appointments are after yours.  Also, if you no show three or more times for appointments you may be dismissed from the clinic at the providers discretion.     Again, thank you for choosing [facility].  Our hope is that these requests will decrease the amount of time that you wait before being seen by our physicians.       _____________________________________________________________  Should you have questions after your visit to [name], please contact our office at [phone] and follow the prompts.  Our office hours are 8:00 a.m. and 4:30 p.m. Monday - Friday.  Please note that voicemails left after 4:00 p.m. may not be returned until the following business day.  We are closed weekends and major holidays.  You do have access to a nurse 24-7, just call the main number to the clinic [phone] and do not press any options, hold on the line and a nurse will  answer the phone.    For prescription refill requests, have your pharmacy contact our office and allow 72 hours.    Due to Covid, you will need to wear a mask upon entering the hospital. If you do not have a mask, a mask will be given to you at the Main Entrance upon arrival. For doctor visits, patients may have 1 support person age 46 or older with them. For treatment visits, patients can not have anyone with them due to social distancing guidelines and our immunocompromised population.

## 2023-04-23 NOTE — Progress Notes (Signed)
EPIC Encounter for ICM Monitoring  Patient Name: Roger Lowe. is a 77 y.o. male Date: 04/23/2023 Primary Care Physican: Benita Stabile, MD Primary Cardiologist: Branch Electrophysiologist: Ladona Ridgel BiV Pacing: >99% 05/13/2022 Weight: 213 lbs - 214 lbs 09/24/2022 Weight: 216 lbs 02/11/2023 Weight: 220 lbs         Spoke with patient and heart failure questions reviewed.  Transmission results reviewed.  Pt asymptomatic for fluid accumulation.  Pt reports was in hospital while he was in [place] for 6 days during in June due to serious infection from left ear.   CorVue thoracic impedance suggesting normal fluid levels with the exception of possible fluid accumulation from 7/27-8/5.   Prescribed: Furosemide 40 mg take 1 tablet daily. Spironolactone 25 mg take 1 tablet daily Farxiga 5 mg take 1 tablet daily   Labs: 10/08/2022 Creatinine 1.18, BUN 8,   Potassium 3.7, Sodium 135, GFR >60 09/02/2022 Creatinine 1.28, BUN 6,   Potassium 4.0, Sodium 136, GFR 59 02/06/2022 Creatinine 1.32, BUN 25, Potassium 4.1, Sodium 138, GFR 57 A complete set of results can be found in Results Review.   Recommendations:  No changes and encouraged to call if experiencing any fluid symptoms.   Follow-up plan: ICM clinic phone appointment on 05/25/2023   91 day device clinic remote transmission 05/18/2023.     EP/Cardiology Office Visits:  07/17/2023 with Dr Wyline Mood.   Recall 07/03/2023 with Dr. Ladona Ridgel.     Copy of ICM check sent to Dr. Ladona Ridgel.  3 month ICM trend: 04/20/2023.    12-14 Month ICM trend:     Karie Soda, RN 04/23/2023 12:26 PM

## 2023-04-27 DIAGNOSIS — E1065 Type 1 diabetes mellitus with hyperglycemia: Secondary | ICD-10-CM | POA: Diagnosis not present

## 2023-04-29 ENCOUNTER — Ambulatory Visit: Payer: Medicare HMO | Admitting: Pulmonary Disease

## 2023-05-05 DIAGNOSIS — J449 Chronic obstructive pulmonary disease, unspecified: Secondary | ICD-10-CM | POA: Diagnosis not present

## 2023-05-06 ENCOUNTER — Other Ambulatory Visit: Payer: Self-pay | Admitting: Pulmonary Disease

## 2023-05-11 ENCOUNTER — Encounter (HOSPITAL_COMMUNITY): Payer: Self-pay | Admitting: Emergency Medicine

## 2023-05-11 ENCOUNTER — Other Ambulatory Visit: Payer: Self-pay

## 2023-05-11 ENCOUNTER — Other Ambulatory Visit (HOSPITAL_COMMUNITY): Payer: Self-pay | Admitting: Family Medicine

## 2023-05-11 ENCOUNTER — Emergency Department (HOSPITAL_COMMUNITY)
Admission: RE | Admit: 2023-05-11 | Discharge: 2023-05-11 | Disposition: A | Discharge status: Home / Self Care | Payer: Medicare HMO | Source: Ambulatory Visit | Attending: Family Medicine | Admitting: Family Medicine

## 2023-05-11 DIAGNOSIS — G4700 Insomnia, unspecified: Secondary | ICD-10-CM | POA: Diagnosis not present

## 2023-05-11 DIAGNOSIS — R0600 Dyspnea, unspecified: Secondary | ICD-10-CM | POA: Diagnosis not present

## 2023-05-11 DIAGNOSIS — I2693 Single subsegmental pulmonary embolism without acute cor pulmonale: Secondary | ICD-10-CM | POA: Diagnosis not present

## 2023-05-11 DIAGNOSIS — Z794 Long term (current) use of insulin: Secondary | ICD-10-CM | POA: Diagnosis not present

## 2023-05-11 DIAGNOSIS — Z79899 Other long term (current) drug therapy: Secondary | ICD-10-CM | POA: Diagnosis not present

## 2023-05-11 DIAGNOSIS — R7989 Other specified abnormal findings of blood chemistry: Secondary | ICD-10-CM | POA: Insufficient documentation

## 2023-05-11 DIAGNOSIS — R0689 Other abnormalities of breathing: Secondary | ICD-10-CM

## 2023-05-11 DIAGNOSIS — J479 Bronchiectasis, uncomplicated: Secondary | ICD-10-CM | POA: Diagnosis not present

## 2023-05-11 DIAGNOSIS — H7192 Unspecified cholesteatoma, left ear: Secondary | ICD-10-CM | POA: Diagnosis not present

## 2023-05-11 DIAGNOSIS — Z87891 Personal history of nicotine dependence: Secondary | ICD-10-CM | POA: Diagnosis not present

## 2023-05-11 DIAGNOSIS — M799 Soft tissue disorder, unspecified: Secondary | ICD-10-CM | POA: Diagnosis not present

## 2023-05-11 DIAGNOSIS — N281 Cyst of kidney, acquired: Secondary | ICD-10-CM | POA: Diagnosis not present

## 2023-05-11 DIAGNOSIS — Z85118 Personal history of other malignant neoplasm of bronchus and lung: Secondary | ICD-10-CM | POA: Insufficient documentation

## 2023-05-11 DIAGNOSIS — J449 Chronic obstructive pulmonary disease, unspecified: Secondary | ICD-10-CM | POA: Diagnosis not present

## 2023-05-11 DIAGNOSIS — I509 Heart failure, unspecified: Secondary | ICD-10-CM | POA: Diagnosis not present

## 2023-05-11 DIAGNOSIS — Z7901 Long term (current) use of anticoagulants: Secondary | ICD-10-CM | POA: Diagnosis not present

## 2023-05-11 DIAGNOSIS — J069 Acute upper respiratory infection, unspecified: Secondary | ICD-10-CM | POA: Diagnosis not present

## 2023-05-11 DIAGNOSIS — R079 Chest pain, unspecified: Secondary | ICD-10-CM | POA: Diagnosis present

## 2023-05-11 DIAGNOSIS — M25559 Pain in unspecified hip: Secondary | ICD-10-CM | POA: Diagnosis not present

## 2023-05-11 DIAGNOSIS — C3490 Malignant neoplasm of unspecified part of unspecified bronchus or lung: Secondary | ICD-10-CM | POA: Diagnosis not present

## 2023-05-11 MED ORDER — IOHEXOL 350 MG/ML SOLN
75.0000 mL | Freq: Once | INTRAVENOUS | Status: AC | PRN
  Administered 2023-05-11: 75 mL via INTRAVENOUS

## 2023-05-11 NOTE — ED Triage Notes (Signed)
Pt to ed pov after receiving a phone call from [facility] stating pt needs to return to the ED for tx of a "lung embolism". Pt does state chest pain along with sob. Pt normally wears 2-4 L O2 [place]. Pt currently on 2L.

## 2023-05-12 ENCOUNTER — Emergency Department (HOSPITAL_COMMUNITY)
Admission: EM | Admit: 2023-05-12 | Discharge: 2023-05-12 | Disposition: A | Discharge status: Home / Self Care | Payer: Medicare HMO | Attending: Emergency Medicine | Admitting: Emergency Medicine

## 2023-05-12 DIAGNOSIS — I2693 Single subsegmental pulmonary embolism without acute cor pulmonale: Secondary | ICD-10-CM

## 2023-05-12 NOTE — ED Provider Notes (Signed)
[place] EMERGENCY DEPARTMENT AT [facility]  Provider Note  CSN: [id number] Arrival date & time: 05/11/23 2310  History Chief Complaint  Patient presents with   Pulmonary Embolism     Roger Beh. is a 77 y.o. male with complex history of COPD, CHF, Lung cancer s/p radiation with recurrence in the last 12 months, DVT/PE on Xarelto and frequent pneumonias was called today by Radiologist after outpatient CTA shows PE. He has had numerous chronic complaints including chest pains, hip pain, cough and SOB. He apparently was at his PCP office in the morning of 9/9 and had labs done which included a positive d-dimer. He was sent for an outpatient CTA and then called by Radiologist as above. He reports missing two doses of his Xarelto last week but on the whole is compliant. He has had recent visits with Oncology (Dr. Ellin Saba) and is scheduled to see Pulmonology (Dr. Tonia Brooms) later today (9/10).    Home Medications Prior to Admission medications   Medication Sig Start Date End Date Taking? Authorizing Provider  albuterol (VENTOLIN HFA) 108 (90 Base) MCG/ACT inhaler Inhale 1-2 puffs into the lungs every 6 (six) hours as needed for shortness of breath or wheezing. 01/20/22   Icard, Rachel Bo, DO  allopurinol (ZYLOPRIM) 100 MG tablet Take 100 mg by mouth daily.    [provider]  amitriptyline (ELAVIL) 50 MG tablet Take 50 mg by mouth 2 (two) times daily.     [provider]  amLODipine (NORVASC) 5 MG tablet Take 5 mg by mouth daily. 03/03/23   [provider]  AREXVY 120 MCG/0.5ML injection  05/29/22   [provider]  arformoterol (BROVANA) 15 MCG/2ML NEBU USE 1 VIAL  IN  NEBULIZER TWICE  DAILY - Morning And Evening 10/28/21   Glenford Bayley, NP  atorvastatin (LIPITOR) 40 MG tablet Take 40 mg by mouth at bedtime. 09/12/20   [provider]  azithromycin (ZITHROMAX) 500 MG tablet TAKE 1 TABLET BY MOUTH ON MONDAY, [facility] AND FRIDAY 05/07/23    Icard, Elige Radon L, DO  budesonide (PULMICORT) 0.5 MG/2ML nebulizer solution USE 1 VIAL  IN  NEBULIZER TWICE  DAILY - Rinse Mouth After Treatment 10/28/21   Glenford Bayley, NP  carvedilol (COREG) 25 MG tablet TAKE 1 TABLET TWICE DAILY 01/05/23   Antoine Poche, MD  ergocalciferol (VITAMIN D2) 1.25 MG (50000 UT) capsule Take 50,000 Units by mouth every Tuesday.    [provider]  FARXIGA 10 MG TABS tablet Take 10 mg by mouth daily. 10/09/21   [provider]  FLUZONE HIGH-DOSE QUADRIVALENT 0.7 ML SUSY  05/29/22   [provider]  furosemide (LASIX) 40 MG tablet Take 40 mg by mouth daily.    [provider]  gabapentin (NEURONTIN) 100 MG capsule Take 100 mg by mouth 3 (three) times daily.  10/28/19   [provider]  hydrALAZINE (APRESOLINE) 50 MG tablet Take 1.5 tablets (75 mg total) by mouth 3 (three) times daily. 01/15/23 04/15/23  Antoine Poche, MD  Insulin Detemir (LEVEMIR FLEXTOUCH) 100 UNIT/ML Pen Inject 45 Units into the skin at bedtime. Patient taking differently: Inject 50 Units into the skin at bedtime. 11/03/18   Strader, Lennart Pall, PA-C  ipratropium-albuterol (DUONEB) 0.5-2.5 (3) MG/3ML SOLN USE 1 VIAL IN NEBULIZER EVERY 6 HOURS - And As Needed (For Rescue -MAX 30 DOSES PER MONTH) Patient taking differently: Take 3 mLs by nebulization every 6 (six) hours as needed (Asthma). USE 1  VIAL IN NEBULIZER EVERY 6 HOURS As Needed (For Rescue -MAX 30 DOSES PER MONTH) 12/23/21   Glenford Bayley, NP  ketorolac (TORADOL) 30 MG/ML injection Inject 30 mg into the muscle daily as needed for moderate pain. 01/24/22   [provider]  methocarbamol (ROBAXIN) 750 MG tablet Take 750 mg by mouth 2 (two) times daily as needed for muscle spasms. 03/27/21   [provider]  methylPREDNISolone (MEDROL) 4 MG tablet Take 8 mg by mouth daily as needed (pain in back). 01/29/22   [provider]  nitroGLYCERIN (NITROSTAT) 0.4 MG SL tablet PLACE 1  TABLET UNDER THE TONGUE EVERY 5 (FIVE) MINUTES AS NEEDED FOR CHEST PAIN  AS DIRECTED 08/06/20   Strader, Lennart Pall, PA-C  NOVOLOG FLEXPEN 100 UNIT/ML FlexPen Inject 25-40 Units into the skin 3 (three) times daily with meals. Sliding scale 02/21/20   [provider]  OVER THE COUNTER MEDICATION Compression vest     [provider]  oxyCODONE-acetaminophen (PERCOCET) 10-325 MG tablet Take 1 tablet by mouth every 6 (six) hours as needed for pain.    [provider]  OXYGEN Inhale 2-4 L into the lungs continuous.    [provider]  pantoprazole (PROTONIX) 40 MG tablet Take 40 mg by mouth in the morning and at bedtime.    [provider]  RELION PEN NEEDLES 31G X 6 MM MISC USE 1 PEN NEEDLE 4 TIMES DAILY 08/12/19   [provider]  rivaroxaban (XARELTO) 20 MG TABS tablet Take 1 tablet (20 mg total) by mouth every morning. 02/03/20   Antoine Poche, MD  sodium chloride (OCEAN) 0.65 % SOLN nasal spray Place 1 spray into both nostrils as needed for congestion.    [provider]  SPIRIVA RESPIMAT 2.5 MCG/ACT AERS INHALE 2 PUFFS INTO THE LUNGS DAILY. 08/11/22   Icard, Rachel Bo, DO  spironolactone (ALDACTONE) 25 MG tablet TAKE 1 TABLET (25 MG TOTAL) BY MOUTH DAILY. 02/24/22 02/19/23  Antoine Poche, MD  tamsulosin (FLOMAX) 0.4 MG CAPS capsule Take 1 capsule (0.4 mg total) by mouth daily. 02/24/23   Donnita Falls, FNP     Allergies    Entresto [sacubitril-valsartan] and Ace inhibitors   Review of Systems   Review of Systems Please see HPI for pertinent positives and negatives  Physical Exam BP 107/64 (BP Location: Right Arm)   Pulse 99   Temp 97.7 F (36.5 C) (Oral)   Resp 18   Ht 6\' 1"  (1.854 m)   Wt 96.8 kg   SpO2 96%   BMI 28.16 kg/m   Physical Exam Vitals and nursing note reviewed.  Constitutional:      Appearance: Normal appearance.  HENT:     Head: Normocephalic and atraumatic.     Nose: Nose normal.      Mouth/Throat:     Mouth: Mucous membranes are moist.  Eyes:     Extraocular Movements: Extraocular movements intact.     Conjunctiva/sclera: Conjunctivae normal.  Cardiovascular:     Rate and Rhythm: Normal rate.  Pulmonary:     Effort: Pulmonary effort is normal.     Breath sounds: Normal breath sounds.  Abdominal:     General: Abdomen is flat.     Palpations: Abdomen is soft.     Tenderness: There is no abdominal tenderness.  Musculoskeletal:        General: No swelling. Normal range of motion.     Cervical back: Neck supple.  Skin:  General: Skin is warm and dry.  Neurological:     General: No focal deficit present.     Mental Status: He is alert.  Psychiatric:        Mood and Affect: Mood normal.     ED Results / Procedures / Treatments   EKG None  Procedures Procedures  Medications Ordered in the ED Medications - No data to display  Initial Impression and Plan  Patient here with outpatient CTA showing "Findings suspicious for pulmonary embolism within a segmental branch of the left lower lobe. No evidence for right heart strain." He admits to missing a couple of doses of Xarelto last week. He is not in distress, vitals are reassuring. Will discuss with Pulmonolgy whether they recommend any specific change in management tonight.   ED Course   Clinical Course as of 05/12/23 0114  Tue May 12, 2023  0112 Spoke with Dr. Chestine Spore, Pulmonology, who did not feel the patient would benefit from admission. Given his missed doses, would not call this a failure of DOAC. He reports he has enough Xarelto at home, no issues with affording the prescription. Brother at bedside will assure his close follow up with Dr. Tonia Brooms tomorrow. Patient is comfortable with that plan. RTED for any other concerns.  [CS]    Clinical Course User Index [CS] Pollyann Savoy, MD     MDM Rules/Calculators/A&P Medical Decision Making Problems Addressed: Single subsegmental pulmonary embolism  without acute cor pulmonale [facility]): acute illness or injury  Risk Prescription drug management.     Final Clinical Impression(s) / ED Diagnoses Final diagnoses:  Single subsegmental pulmonary embolism without acute cor pulmonale [facility])    Rx / DC Orders ED Discharge Orders     None        Pollyann Savoy, MD 05/12/23 [phone]

## 2023-05-12 NOTE — Discharge Instructions (Signed)
Continue taking your Xarelto as prescribed. Do not miss any doses.
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
## 2023-05-13 ENCOUNTER — Encounter: Payer: Self-pay | Admitting: Pulmonary Disease

## 2023-05-13 ENCOUNTER — Ambulatory Visit (INDEPENDENT_AMBULATORY_CARE_PROVIDER_SITE_OTHER): Payer: Medicare HMO | Admitting: Pulmonary Disease

## 2023-05-13 VITALS — BP 140/90 | HR 106 | Ht 73.0 in | Wt 209.4 lb

## 2023-05-13 DIAGNOSIS — C3432 Malignant neoplasm of lower lobe, left bronchus or lung: Secondary | ICD-10-CM

## 2023-05-13 DIAGNOSIS — J449 Chronic obstructive pulmonary disease, unspecified: Secondary | ICD-10-CM | POA: Diagnosis not present

## 2023-05-13 NOTE — Patient Instructions (Signed)
Thank you for visiting Dr. Tonia Brooms at [facility]. Today we recommend the following:  Orders Placed This Encounter  Procedures   Amb Referral to Palliative Care   Return in about 3 months (around 08/12/2023) for with APP.    Please do your part to reduce the spread of COVID-19.

## 2023-05-18 ENCOUNTER — Ambulatory Visit (INDEPENDENT_AMBULATORY_CARE_PROVIDER_SITE_OTHER): Payer: Medicare HMO

## 2023-05-18 DIAGNOSIS — I428 Other cardiomyopathies: Secondary | ICD-10-CM

## 2023-05-18 LAB — CUP PACEART REMOTE DEVICE CHECK
Battery Remaining Longevity: 53 mo
Battery Remaining Percentage: 58 %
Battery Voltage: 2.95 V
Brady Statistic AP VP Percent: 1 %
Brady Statistic AP VS Percent: 1 %
Brady Statistic AS VP Percent: 99 %
Brady Statistic AS VS Percent: 1 %
Brady Statistic RA Percent Paced: 1 %
Date Time Interrogation Session: 20240915220230
HighPow Impedance: 80 Ohm
Implantable Lead Connection Status: 753985
Implantable Lead Connection Status: 753985
Implantable Lead Connection Status: 753985
Implantable Lead Implant Date: 20210308
Implantable Lead Implant Date: 20210308
Implantable Lead Implant Date: 20210308
Implantable Lead Location: 753858
Implantable Lead Location: 753859
Implantable Lead Location: 753860
Implantable Lead Model: 7122
Implantable Pulse Generator Implant Date: 20210308
Lead Channel Impedance Value: 380 Ohm
Lead Channel Impedance Value: 510 Ohm
Lead Channel Impedance Value: 800 Ohm
Lead Channel Pacing Threshold Amplitude: 0.625 V
Lead Channel Pacing Threshold Amplitude: 0.75 V
Lead Channel Pacing Threshold Amplitude: 1.25 V
Lead Channel Pacing Threshold Pulse Width: 0.5 ms
Lead Channel Pacing Threshold Pulse Width: 0.5 ms
Lead Channel Pacing Threshold Pulse Width: 0.5 ms
Lead Channel Sensing Intrinsic Amplitude: 5 mV
Lead Channel Sensing Intrinsic Amplitude: 7.5 mV
Lead Channel Setting Pacing Amplitude: 1.625
Lead Channel Setting Pacing Amplitude: 2 V
Lead Channel Setting Pacing Amplitude: 2.5 V
Lead Channel Setting Pacing Pulse Width: 0.5 ms
Lead Channel Setting Pacing Pulse Width: 0.5 ms
Lead Channel Setting Sensing Sensitivity: 0.5 mV
Pulse Gen Serial Number: 111019088
Zone Setting Status: 755011

## 2023-05-25 ENCOUNTER — Inpatient Hospital Stay: Payer: Medicare HMO | Attending: Internal Medicine

## 2023-05-25 DIAGNOSIS — I5022 Chronic systolic (congestive) heart failure: Secondary | ICD-10-CM

## 2023-05-25 DIAGNOSIS — Z95810 Presence of automatic (implantable) cardiac defibrillator: Secondary | ICD-10-CM | POA: Diagnosis not present

## 2023-05-26 DIAGNOSIS — J449 Chronic obstructive pulmonary disease, unspecified: Secondary | ICD-10-CM | POA: Diagnosis not present

## 2023-05-26 DIAGNOSIS — Z23 Encounter for immunization: Secondary | ICD-10-CM | POA: Diagnosis not present

## 2023-05-26 DIAGNOSIS — E782 Mixed hyperlipidemia: Secondary | ICD-10-CM | POA: Diagnosis not present

## 2023-05-26 DIAGNOSIS — R059 Cough, unspecified: Secondary | ICD-10-CM | POA: Diagnosis not present

## 2023-05-26 DIAGNOSIS — N1831 Chronic kidney disease, stage 3a: Secondary | ICD-10-CM | POA: Diagnosis not present

## 2023-05-26 DIAGNOSIS — C3490 Malignant neoplasm of unspecified part of unspecified bronchus or lung: Secondary | ICD-10-CM | POA: Diagnosis not present

## 2023-05-26 DIAGNOSIS — E1165 Type 2 diabetes mellitus with hyperglycemia: Secondary | ICD-10-CM | POA: Diagnosis not present

## 2023-05-26 DIAGNOSIS — K219 Gastro-esophageal reflux disease without esophagitis: Secondary | ICD-10-CM | POA: Diagnosis not present

## 2023-05-26 DIAGNOSIS — I2510 Atherosclerotic heart disease of native coronary artery without angina pectoris: Secondary | ICD-10-CM | POA: Diagnosis not present

## 2023-05-26 DIAGNOSIS — M109 Gout, unspecified: Secondary | ICD-10-CM | POA: Diagnosis not present

## 2023-05-26 DIAGNOSIS — E1122 Type 2 diabetes mellitus with diabetic chronic kidney disease: Secondary | ICD-10-CM | POA: Diagnosis not present

## 2023-05-27 DIAGNOSIS — E1065 Type 1 diabetes mellitus with hyperglycemia: Secondary | ICD-10-CM | POA: Diagnosis not present

## 2023-05-27 NOTE — Progress Notes (Unsigned)
EPIC Encounter for ICM Monitoring  Patient Name: Roger Lowe. is a 77 y.o. male Date: 05/27/2023 Primary Care Physican: Benita Stabile, MD Primary Cardiologist: Branch Electrophysiologist: Ladona Ridgel BiV Pacing: >99% 05/13/2022 Weight: 213 lbs - 214 lbs 09/24/2022 Weight: 216 lbs 02/11/2023 Weight: 220 lbs         Attempted call to patient and unable to reach.  Transmission reviewed.    CorVue thoracic impedance suggesting normal fluid levels with the exception of possible fluid accumulation from 9/3-9/8.   Prescribed: Furosemide 40 mg take 1 tablet daily. Spironolactone 25 mg take 1 tablet daily Farxiga 5 mg take 1 tablet daily   Labs: 04/15/2023 Creatinine 1.10, BUN 8,   Potassium 3.9, Sodium 135, GFR >60 10/08/2022 Creatinine 1.18, BUN 8,   Potassium 3.7, Sodium 135, GFR >60 09/02/2022 Creatinine 1.28, BUN 6,   Potassium 4.0, Sodium 136, GFR 59 02/06/2022 Creatinine 1.32, BUN 25, Potassium 4.1, Sodium 138, GFR 57 A complete set of results can be found in Results Review.   Recommendations:  Unable to reach.     Follow-up plan: ICM clinic phone appointment on 06/29/2023   91 day device clinic remote transmission 08/17/2023.     EP/Cardiology Office Visits:  07/17/2023 with Dr Wyline Mood.   Recall 07/03/2023 with Dr. Ladona Ridgel.     Copy of ICM check sent to Dr. Ladona Ridgel.  3 month ICM trend: 05/24/2023.    12-14 Month ICM trend:     Karie Soda, RN 05/27/2023 1:34 PM

## 2023-05-28 ENCOUNTER — Telehealth: Payer: Self-pay

## 2023-05-28 NOTE — Telephone Encounter (Signed)
Remote ICM transmission received.  Attempted call to patient regarding ICM remote transmission and no answer.  

## 2023-05-31 ENCOUNTER — Other Ambulatory Visit: Payer: Self-pay | Admitting: Pulmonary Disease

## 2023-06-01 NOTE — Progress Notes (Signed)
Remote ICD transmission.   

## 2023-06-04 DIAGNOSIS — H906 Mixed conductive and sensorineural hearing loss, bilateral: Secondary | ICD-10-CM | POA: Diagnosis not present

## 2023-06-04 DIAGNOSIS — H7192 Unspecified cholesteatoma, left ear: Secondary | ICD-10-CM | POA: Diagnosis not present

## 2023-06-04 DIAGNOSIS — H90A32 Mixed conductive and sensorineural hearing loss, unilateral, left ear with restricted hearing on the contralateral side: Secondary | ICD-10-CM | POA: Diagnosis not present

## 2023-06-08 ENCOUNTER — Ambulatory Visit: Payer: Medicare HMO | Attending: Nurse Practitioner | Admitting: Nurse Practitioner

## 2023-06-08 ENCOUNTER — Other Ambulatory Visit (HOSPITAL_COMMUNITY)
Admission: RE | Admit: 2023-06-08 | Discharge: 2023-06-08 | Disposition: A | Discharge status: Home / Self Care | Payer: Medicare HMO | Attending: Cardiology | Admitting: Cardiology

## 2023-06-08 ENCOUNTER — Encounter: Payer: Self-pay | Admitting: Nurse Practitioner

## 2023-06-08 VITALS — BP 138/78 | HR 103 | Ht 72.0 in | Wt 214.5 lb

## 2023-06-08 DIAGNOSIS — I5032 Chronic diastolic (congestive) heart failure: Secondary | ICD-10-CM | POA: Diagnosis not present

## 2023-06-08 DIAGNOSIS — I2510 Atherosclerotic heart disease of native coronary artery without angina pectoris: Secondary | ICD-10-CM

## 2023-06-08 DIAGNOSIS — R079 Chest pain, unspecified: Secondary | ICD-10-CM | POA: Insufficient documentation

## 2023-06-08 DIAGNOSIS — I428 Other cardiomyopathies: Secondary | ICD-10-CM | POA: Diagnosis not present

## 2023-06-08 DIAGNOSIS — E782 Mixed hyperlipidemia: Secondary | ICD-10-CM | POA: Diagnosis not present

## 2023-06-08 DIAGNOSIS — R072 Precordial pain: Secondary | ICD-10-CM | POA: Diagnosis not present

## 2023-06-08 DIAGNOSIS — I10 Essential (primary) hypertension: Secondary | ICD-10-CM | POA: Diagnosis not present

## 2023-06-08 LAB — TROPONIN I (HIGH SENSITIVITY): Troponin I (High Sensitivity): 13 ng/L (ref <18)
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
## 2023-06-08 NOTE — Patient Instructions (Signed)
Medication Instructions:  Your physician recommends that you continue on your current medications as directed. Please refer to the Current Medication list given to you today.  *If you need a refill on your cardiac medications before your next appointment, please call your pharmacy*   Lab Work: Troponin  If you have labs (blood work) drawn today and your tests are completely normal, you will receive your results only by: MyChart Message (if you have MyChart) OR A paper copy in the mail If you have any lab test that is abnormal or we need to change your treatment, we will call you to review the results.   Testing/Procedures: Your physician has requested that you have an echocardiogram. Echocardiography is a painless test that uses sound waves to create images of your heart. It provides your doctor with information about the size and shape of your heart and how well your heart's chambers and valves are working. This procedure takes approximately one hour. There are no restrictions for this procedure. Please do NOT wear cologne, perfume, aftershave, or lotions (deodorant is allowed). Please arrive 15 minutes prior to your appointment time.    Follow-Up: At [facility], you and your health needs are our priority.  As part of our continuing mission to provide you with exceptional heart care, we have created designated Provider Care Teams.  These Care Teams include your primary Cardiologist (physician) and Advanced Practice Providers (APPs -  Physician Assistants and Nurse Practitioners) who all work together to provide you with the care you need, when you need it.  We recommend signing up for the patient portal called "MyChart".  Sign up information is provided on this After Visit Summary.  MyChart is used to connect with patients for Virtual Visits (Telemedicine).  Patients are able to view lab/test results, encounter notes, upcoming appointments, etc.  Non-urgent messages can be sent  to your provider as well.   To learn more about what you can do with MyChart, go to [email or web address].    Your next appointment:    1 month  Provider:   You may see Dina Rich, MD or one of the following Advanced Practice Providers on your designated Care Team:   Randall An, PA-C  Jacolyn Reedy, [place]     Other Instructions

## 2023-06-15 DIAGNOSIS — J449 Chronic obstructive pulmonary disease, unspecified: Secondary | ICD-10-CM | POA: Diagnosis not present

## 2023-06-16 ENCOUNTER — Other Ambulatory Visit: Payer: Self-pay | Admitting: Pulmonary Disease

## 2023-06-29 ENCOUNTER — Inpatient Hospital Stay: Payer: Medicare HMO | Attending: Internal Medicine

## 2023-06-29 DIAGNOSIS — Z95810 Presence of automatic (implantable) cardiac defibrillator: Secondary | ICD-10-CM

## 2023-06-29 DIAGNOSIS — I5022 Chronic systolic (congestive) heart failure: Secondary | ICD-10-CM

## 2023-07-03 NOTE — Progress Notes (Signed)
EPIC Encounter for ICM Monitoring  Patient Name: Roger Lowe. is a 77 y.o. male Date: 07/03/2023 Primary Care Physican: No primary care provider on file. Primary Cardiologist: Branch Electrophysiologist: Ladona Ridgel BiV Pacing: >99% 05/13/2022 Weight: 213 lbs - 214 lbs 09/24/2022 Weight: 216 lbs 02/11/2023 Weight: 220 lbs 07/03/2023 Weight: 214 lbs         Spoke with patient and heart failure questions reviewed.  Transmission results reviewed.  Pt asymptomatic for fluid accumulation.  Pt reports his lung cancer is terminal and is at stage 3.  He said he is at peace with it and has told his family.   CorVue thoracic impedance suggesting normal fluid levels with the exception of possible fluid accumulation from 10/5-10/9 and 10/15-10/19.   Prescribed: Furosemide 40 mg take 1 tablet daily. Spironolactone 25 mg take 1 tablet daily Farxiga 5 mg take 1 tablet daily   Labs: 04/15/2023 Creatinine 1.10, BUN 8,   Potassium 3.9, Sodium 135, GFR >60 10/08/2022 Creatinine 1.18, BUN 8,   Potassium 3.7, Sodium 135, GFR >60 09/02/2022 Creatinine 1.28, BUN 6,   Potassium 4.0, Sodium 136, GFR 59 02/06/2022 Creatinine 1.32, BUN 25, Potassium 4.1, Sodium 138, GFR 57 A complete set of results can be found in Results Review.   Recommendations:  No changes and encouraged to call if experiencing any fluid symptoms.     Follow-up plan: ICM clinic phone appointment on 08/03/2023   91 day device clinic remote transmission 08/17/2023.     EP/Cardiology Office Visits:  07/17/2023 with Dr Wyline Mood.   09/09/2023 with Dr. Ladona Ridgel.     Copy of ICM check sent to Dr. Ladona Ridgel.  3 month ICM trend: 06/28/2023.    12-14 Month ICM trend:     Karie Soda, RN 07/03/2023 7:46 AM

## 2023-07-07 ENCOUNTER — Other Ambulatory Visit: Payer: Medicare HMO

## 2023-07-09 DIAGNOSIS — J449 Chronic obstructive pulmonary disease, unspecified: Secondary | ICD-10-CM | POA: Diagnosis not present

## 2023-07-09 DIAGNOSIS — Z515 Encounter for palliative care: Secondary | ICD-10-CM | POA: Diagnosis not present

## 2023-07-10 DIAGNOSIS — H906 Mixed conductive and sensorineural hearing loss, bilateral: Secondary | ICD-10-CM | POA: Diagnosis not present

## 2023-07-16 DIAGNOSIS — J449 Chronic obstructive pulmonary disease, unspecified: Secondary | ICD-10-CM | POA: Diagnosis not present

## 2023-07-17 ENCOUNTER — Ambulatory Visit: Payer: Medicare HMO | Admitting: Cardiology

## 2023-07-17 DIAGNOSIS — G894 Chronic pain syndrome: Secondary | ICD-10-CM | POA: Diagnosis not present

## 2023-07-17 DIAGNOSIS — M25551 Pain in right hip: Secondary | ICD-10-CM | POA: Diagnosis not present

## 2023-07-17 DIAGNOSIS — M25552 Pain in left hip: Secondary | ICD-10-CM | POA: Diagnosis not present

## 2023-07-17 DIAGNOSIS — J189 Pneumonia, unspecified organism: Secondary | ICD-10-CM | POA: Diagnosis not present

## 2023-07-17 DIAGNOSIS — Z87891 Personal history of nicotine dependence: Secondary | ICD-10-CM | POA: Diagnosis not present

## 2023-07-17 DIAGNOSIS — M545 Low back pain, unspecified: Secondary | ICD-10-CM | POA: Diagnosis not present

## 2023-07-22 ENCOUNTER — Ambulatory Visit (HOSPITAL_COMMUNITY): Admission: RE | Admit: 2023-07-22 | Payer: Medicare HMO | Source: Ambulatory Visit

## 2023-07-29 ENCOUNTER — Ambulatory Visit (HOSPITAL_COMMUNITY)
Admission: RE | Admit: 2023-07-29 | Discharge: 2023-07-29 | Disposition: A | Discharge status: Home / Self Care | Payer: Medicare HMO | Source: Ambulatory Visit | Attending: Nurse Practitioner | Admitting: Nurse Practitioner

## 2023-07-29 ENCOUNTER — Other Ambulatory Visit (HOSPITAL_COMMUNITY): Payer: Self-pay | Admitting: Nurse Practitioner

## 2023-07-29 DIAGNOSIS — M25551 Pain in right hip: Secondary | ICD-10-CM | POA: Diagnosis not present

## 2023-07-29 DIAGNOSIS — J984 Other disorders of lung: Secondary | ICD-10-CM | POA: Diagnosis not present

## 2023-07-29 DIAGNOSIS — G894 Chronic pain syndrome: Secondary | ICD-10-CM | POA: Diagnosis not present

## 2023-07-29 DIAGNOSIS — J189 Pneumonia, unspecified organism: Secondary | ICD-10-CM | POA: Diagnosis not present

## 2023-07-29 DIAGNOSIS — R0602 Shortness of breath: Secondary | ICD-10-CM | POA: Diagnosis not present

## 2023-07-29 DIAGNOSIS — R079 Chest pain, unspecified: Secondary | ICD-10-CM | POA: Diagnosis not present

## 2023-08-03 ENCOUNTER — Telehealth: Payer: Self-pay

## 2023-08-03 ENCOUNTER — Inpatient Hospital Stay: Payer: Medicare HMO | Attending: Internal Medicine

## 2023-08-03 DIAGNOSIS — Z95810 Presence of automatic (implantable) cardiac defibrillator: Secondary | ICD-10-CM | POA: Diagnosis not present

## 2023-08-03 DIAGNOSIS — I5022 Chronic systolic (congestive) heart failure: Secondary | ICD-10-CM

## 2023-08-03 NOTE — Progress Notes (Signed)
EPIC Encounter for ICM Monitoring  Patient Name: Roger Lowe. is a 77 y.o. male Date: 08/03/2023 Primary Care Physican: Benita Stabile, MD Primary Cardiologist: Branch Electrophysiologist: Ladona Ridgel BiV Pacing: >99% 05/13/2022 Weight: 213 lbs - 214 lbs 09/24/2022 Weight: 216 lbs 02/11/2023 Weight: 220 lbs 07/03/2023 Weight: 214 lbs         Attempted call to patient and unable to reach.  Transmission reviewed.    CorVue thoracic impedance suggesting possible fluid accumulation starting 11/30.   Prescribed: Furosemide 40 mg take 1 tablet daily. Spironolactone 25 mg take 1 tablet daily Farxiga 5 mg take 1 tablet daily   Labs: 04/15/2023 Creatinine 1.10, BUN 8,   Potassium 3.9, Sodium 135, GFR >60 10/08/2022 Creatinine 1.18, BUN 8,   Potassium 3.7, Sodium 135, GFR >60 09/02/2022 Creatinine 1.28, BUN 6,   Potassium 4.0, Sodium 136, GFR 59 02/06/2022 Creatinine 1.32, BUN 25, Potassium 4.1, Sodium 138, GFR 57 A complete set of results can be found in Results Review.   Recommendations:  Unable to reach.     Follow-up plan: ICM clinic phone appointment on 08/10/2023 to recheck fluid levels   91 day device clinic remote transmission 08/17/2023.     EP/Cardiology Office Visits:  10/07/2023 with Dr Wyline Mood.   09/09/2023 with Dr. Ladona Ridgel.     Copy of ICM check sent to Dr. Ladona Ridgel.  3 month ICM trend: 08/03/2023.    12-14 Month ICM trend:     Karie Soda, RN 08/03/2023 1:06 PM

## 2023-08-03 NOTE — Telephone Encounter (Signed)
 Remote ICM transmission received.  Attempted call to patient regarding ICM remote transmission and no answer.  Voice mail box is full.

## 2023-08-09 ENCOUNTER — Other Ambulatory Visit: Payer: Self-pay | Admitting: Pulmonary Disease

## 2023-08-10 ENCOUNTER — Inpatient Hospital Stay (INDEPENDENT_AMBULATORY_CARE_PROVIDER_SITE_OTHER): Payer: Medicare HMO

## 2023-08-10 DIAGNOSIS — I5022 Chronic systolic (congestive) heart failure: Secondary | ICD-10-CM

## 2023-08-10 DIAGNOSIS — Z515 Encounter for palliative care: Secondary | ICD-10-CM | POA: Diagnosis not present

## 2023-08-10 DIAGNOSIS — J449 Chronic obstructive pulmonary disease, unspecified: Secondary | ICD-10-CM | POA: Diagnosis not present

## 2023-08-10 DIAGNOSIS — Z95810 Presence of automatic (implantable) cardiac defibrillator: Secondary | ICD-10-CM

## 2023-08-11 NOTE — Progress Notes (Signed)
EPIC Encounter for ICM Monitoring  Patient Name: Roger Lowe. is a 77 y.o. male Date: 08/11/2023 Primary Care Physican: Benita Stabile, MD Primary Cardiologist: Branch Electrophysiologist: Ladona Ridgel BiV Pacing: >99% 05/13/2022 Weight: 213 lbs - 214 lbs 09/24/2022 Weight: 216 lbs 02/11/2023 Weight: 220 lbs 07/03/2023 Weight: 214 lbs         Transmission reviewed.    CorVue thoracic impedance suggesting fluid levels returned to normal.   Prescribed: Furosemide 40 mg take 1 tablet daily. Spironolactone 25 mg take 1 tablet daily Farxiga 5 mg take 1 tablet daily   Labs: 04/15/2023 Creatinine 1.10, BUN 8,   Potassium 3.9, Sodium 135, GFR >60 10/08/2022 Creatinine 1.18, BUN 8,   Potassium 3.7, Sodium 135, GFR >60 09/02/2022 Creatinine 1.28, BUN 6,   Potassium 4.0, Sodium 136, GFR 59 02/06/2022 Creatinine 1.32, BUN 25, Potassium 4.1, Sodium 138, GFR 57 A complete set of results can be found in Results Review.   Recommendations:  No changes    Follow-up plan: ICM clinic phone appointment on 09/07/2023   91 day device clinic remote transmission 08/17/2023.     EP/Cardiology Office Visits:  10/07/2023 with Dr Wyline Mood.   09/09/2023 with Dr. Ladona Ridgel.     Copy of ICM check sent to Dr. Ladona Ridgel.  3 month ICM trend: 08/09/2023.    12-14 Month ICM trend:     Karie Soda, RN 08/11/2023 3:40 PM

## 2023-08-12 ENCOUNTER — Ambulatory Visit: Payer: Medicare HMO | Admitting: Nurse Practitioner

## 2023-08-13 ENCOUNTER — Encounter: Payer: Self-pay | Admitting: Student

## 2023-08-17 ENCOUNTER — Ambulatory Visit (INDEPENDENT_AMBULATORY_CARE_PROVIDER_SITE_OTHER): Payer: Medicare HMO

## 2023-08-17 DIAGNOSIS — I5022 Chronic systolic (congestive) heart failure: Secondary | ICD-10-CM

## 2023-08-17 DIAGNOSIS — I428 Other cardiomyopathies: Secondary | ICD-10-CM

## 2023-08-17 LAB — CUP PACEART REMOTE DEVICE CHECK
Battery Remaining Longevity: 50 mo
Battery Remaining Percentage: 55 %
Battery Voltage: 2.95 V
Brady Statistic AP VP Percent: 1 %
Brady Statistic AP VS Percent: 1 %
Brady Statistic AS VP Percent: 99 %
Brady Statistic AS VS Percent: 1 %
Brady Statistic RA Percent Paced: 1 %
Date Time Interrogation Session: 20241215210223
HighPow Impedance: 82 Ohm
Implantable Lead Connection Status: 753985
Implantable Lead Connection Status: 753985
Implantable Lead Connection Status: 753985
Implantable Lead Implant Date: 20210308
Implantable Lead Implant Date: 20210308
Implantable Lead Implant Date: 20210308
Implantable Lead Location: 753858
Implantable Lead Location: 753859
Implantable Lead Location: 753860
Implantable Lead Model: 7122
Implantable Pulse Generator Implant Date: 20210308
Lead Channel Impedance Value: 380 Ohm
Lead Channel Impedance Value: 590 Ohm
Lead Channel Impedance Value: 840 Ohm
Lead Channel Pacing Threshold Amplitude: 0.625 V
Lead Channel Pacing Threshold Amplitude: 0.75 V
Lead Channel Pacing Threshold Amplitude: 1.25 V
Lead Channel Pacing Threshold Pulse Width: 0.5 ms
Lead Channel Pacing Threshold Pulse Width: 0.5 ms
Lead Channel Pacing Threshold Pulse Width: 0.5 ms
Lead Channel Sensing Intrinsic Amplitude: 11.8 mV
Lead Channel Sensing Intrinsic Amplitude: 5 mV
Lead Channel Setting Pacing Amplitude: 1.625
Lead Channel Setting Pacing Amplitude: 2 V
Lead Channel Setting Pacing Amplitude: 2.5 V
Lead Channel Setting Pacing Pulse Width: 0.5 ms
Lead Channel Setting Pacing Pulse Width: 0.5 ms
Lead Channel Setting Sensing Sensitivity: 0.5 mV
Pulse Gen Serial Number: 111019088
Zone Setting Status: 755011

## 2023-08-24 DIAGNOSIS — J449 Chronic obstructive pulmonary disease, unspecified: Secondary | ICD-10-CM | POA: Diagnosis not present

## 2023-08-25 DIAGNOSIS — E1065 Type 1 diabetes mellitus with hyperglycemia: Secondary | ICD-10-CM | POA: Diagnosis not present

## 2023-09-07 ENCOUNTER — Inpatient Hospital Stay: Payer: Medicare HMO | Attending: Internal Medicine

## 2023-09-07 DIAGNOSIS — Z95810 Presence of automatic (implantable) cardiac defibrillator: Secondary | ICD-10-CM

## 2023-09-07 DIAGNOSIS — I5022 Chronic systolic (congestive) heart failure: Secondary | ICD-10-CM | POA: Diagnosis not present
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
## 2023-09-09 ENCOUNTER — Encounter: Payer: Self-pay | Admitting: Internal Medicine

## 2023-09-09 ENCOUNTER — Telehealth: Payer: Self-pay

## 2023-09-09 ENCOUNTER — Ambulatory Visit: Payer: Medicare HMO | Attending: Internal Medicine | Admitting: Internal Medicine

## 2023-09-09 VITALS — BP 142/52 | HR 94 | Ht 72.0 in | Wt 218.6 lb

## 2023-09-09 DIAGNOSIS — I447 Left bundle-branch block, unspecified: Secondary | ICD-10-CM

## 2023-09-09 NOTE — Telephone Encounter (Signed)
 Remote ICM transmission received.  Attempted call to patient regarding ICM remote transmission and no answer.
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
## 2023-09-09 NOTE — Patient Instructions (Signed)

## 2023-09-09 NOTE — Progress Notes (Signed)
 EPIC Encounter for ICM Monitoring  Patient Name: Roger Lowe. is a 78 y.o. male Date: 09/09/2023 Primary Care Physican: Shona Norleen PEDLAR, MD Primary Cardiologist: Branch Electrophysiologist: Waddell BiV Pacing: >99% 05/13/2022 Weight: 213 lbs - 214 lbs 09/24/2022 Weight: 216 lbs 02/11/2023 Weight: 220 lbs 07/03/2023 Weight: 214 lbs         Attempted call to patient and unable to reach.   Transmission results reviewed.    CorVue thoracic impedance suggesting normal fluid levels within the last month.   Prescribed: Furosemide  40 mg take 1 tablet daily. Spironolactone  25 mg take 1 tablet daily Farxiga 5 mg take 1 tablet daily   Labs: 04/15/2023 Creatinine 1.10, BUN 8,   Potassium 3.9, Sodium 135, GFR >60 10/08/2022 Creatinine 1.18, BUN 8,   Potassium 3.7, Sodium 135, GFR >60 09/02/2022 Creatinine 1.28, BUN 6,   Potassium 4.0, Sodium 136, GFR 59 02/06/2022 Creatinine 1.32, BUN 25, Potassium 4.1, Sodium 138, GFR 57 A complete set of results can be found in Results Review.   Recommendations:  Unable to reach.     Follow-up plan: ICM clinic phone appointment on 10/12/2023   91 day device clinic remote transmission 11/16/2023.     EP/Cardiology Office Visits:  10/07/2023 with Dr Alvan.   09/09/2023 with Dr. Waddell.     Copy of ICM check sent to Dr. Waddell.   3 month ICM trend: 09/06/2023.    12-14 Month ICM trend:     Mitzie GORMAN Garner, RN 09/09/2023 3:16 PM

## 2023-09-10 ENCOUNTER — Other Ambulatory Visit: Payer: Medicare HMO

## 2023-09-10 DIAGNOSIS — I5022 Chronic systolic (congestive) heart failure: Secondary | ICD-10-CM | POA: Diagnosis not present

## 2023-09-10 DIAGNOSIS — E782 Mixed hyperlipidemia: Secondary | ICD-10-CM | POA: Diagnosis not present

## 2023-09-10 DIAGNOSIS — E1165 Type 2 diabetes mellitus with hyperglycemia: Secondary | ICD-10-CM | POA: Diagnosis not present

## 2023-09-10 DIAGNOSIS — G894 Chronic pain syndrome: Secondary | ICD-10-CM | POA: Diagnosis not present

## 2023-09-10 DIAGNOSIS — N1831 Chronic kidney disease, stage 3a: Secondary | ICD-10-CM | POA: Diagnosis not present

## 2023-09-10 DIAGNOSIS — I130 Hypertensive heart and chronic kidney disease with heart failure and stage 1 through stage 4 chronic kidney disease, or unspecified chronic kidney disease: Secondary | ICD-10-CM | POA: Diagnosis not present

## 2023-09-10 DIAGNOSIS — R059 Cough, unspecified: Secondary | ICD-10-CM | POA: Diagnosis not present

## 2023-09-10 DIAGNOSIS — J189 Pneumonia, unspecified organism: Secondary | ICD-10-CM | POA: Diagnosis not present

## 2023-09-10 DIAGNOSIS — M109 Gout, unspecified: Secondary | ICD-10-CM | POA: Diagnosis not present

## 2023-09-10 DIAGNOSIS — M25552 Pain in left hip: Secondary | ICD-10-CM | POA: Diagnosis not present

## 2023-09-10 DIAGNOSIS — M25551 Pain in right hip: Secondary | ICD-10-CM | POA: Diagnosis not present

## 2023-09-10 LAB — CUP PACEART INCLINIC DEVICE CHECK
Battery Remaining Longevity: 49 mo
Brady Statistic RA Percent Paced: 0 %
Brady Statistic RV Percent Paced: 99.13 %
Date Time Interrogation Session: 20250108095500
HighPow Impedance: 79.875
Implantable Lead Connection Status: 753985
Implantable Lead Connection Status: 753985
Implantable Lead Connection Status: 753985
Implantable Lead Implant Date: 20210308
Implantable Lead Implant Date: 20210308
Implantable Lead Implant Date: 20210308
Implantable Lead Location: 753858
Implantable Lead Location: 753859
Implantable Lead Location: 753860
Implantable Lead Model: 7122
Implantable Pulse Generator Implant Date: 20210308
Lead Channel Impedance Value: 362.5 Ohm
Lead Channel Impedance Value: 537.5 Ohm
Lead Channel Impedance Value: 862.5 Ohm
Lead Channel Pacing Threshold Amplitude: 0.5 V
Lead Channel Pacing Threshold Amplitude: 0.75 V
Lead Channel Pacing Threshold Amplitude: 0.75 V
Lead Channel Pacing Threshold Amplitude: 1 V
Lead Channel Pacing Threshold Amplitude: 1 V
Lead Channel Pacing Threshold Pulse Width: 0.5 ms
Lead Channel Pacing Threshold Pulse Width: 0.5 ms
Lead Channel Pacing Threshold Pulse Width: 0.5 ms
Lead Channel Pacing Threshold Pulse Width: 0.5 ms
Lead Channel Pacing Threshold Pulse Width: 0.5 ms
Lead Channel Sensing Intrinsic Amplitude: 11.8 mV
Lead Channel Sensing Intrinsic Amplitude: 4.4 mV
Lead Channel Setting Pacing Amplitude: 1.5 V
Lead Channel Setting Pacing Amplitude: 2 V
Lead Channel Setting Pacing Amplitude: 2.5 V
Lead Channel Setting Pacing Pulse Width: 0.5 ms
Lead Channel Setting Pacing Pulse Width: 0.5 ms
Lead Channel Setting Sensing Sensitivity: 0.5 mV
Pulse Gen Serial Number: 111019088
Zone Setting Status: 755011

## 2023-09-14 ENCOUNTER — Ambulatory Visit: Payer: Medicare HMO | Admitting: Urology

## 2023-09-14 DIAGNOSIS — R972 Elevated prostate specific antigen [PSA]: Secondary | ICD-10-CM

## 2023-09-14 DIAGNOSIS — N401 Enlarged prostate with lower urinary tract symptoms: Secondary | ICD-10-CM

## 2023-09-17 ENCOUNTER — Ambulatory Visit: Payer: Medicare HMO | Admitting: Cardiology
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
## 2023-09-21 NOTE — Progress Notes (Signed)
Remote ICD transmission.   

## 2023-09-29 DIAGNOSIS — Z515 Encounter for palliative care: Secondary | ICD-10-CM | POA: Diagnosis not present

## 2023-09-29 DIAGNOSIS — J449 Chronic obstructive pulmonary disease, unspecified: Secondary | ICD-10-CM | POA: Diagnosis not present

## 2023-10-07 ENCOUNTER — Ambulatory Visit: Payer: Medicare HMO | Admitting: Cardiology

## 2023-10-07 ENCOUNTER — Ambulatory Visit: Payer: Medicare HMO | Admitting: Nurse Practitioner

## 2023-10-07 ENCOUNTER — Encounter: Payer: Self-pay | Admitting: Student

## 2023-10-12 ENCOUNTER — Inpatient Hospital Stay: Payer: Medicare HMO | Attending: Internal Medicine

## 2023-10-12 DIAGNOSIS — Z95810 Presence of automatic (implantable) cardiac defibrillator: Secondary | ICD-10-CM | POA: Diagnosis not present

## 2023-10-12 DIAGNOSIS — I5022 Chronic systolic (congestive) heart failure: Secondary | ICD-10-CM

## 2023-10-13 ENCOUNTER — Telehealth: Payer: Self-pay

## 2023-10-13 NOTE — Progress Notes (Signed)
EPIC Encounter for ICM Monitoring  Patient Name: Roger Lowe. is a 78 y.o. male Date: 10/13/2023 Primary Care Physican: Benita Stabile, MD Primary Cardiologist: Branch Electrophysiologist: Ladona Ridgel BiV Pacing: 97% 05/13/2022 Weight: 213 lbs - 214 lbs 09/24/2022 Weight: 216 lbs 02/11/2023 Weight: 220 lbs 07/03/2023 Weight: 214 lbs 09/09/2023 Office Weight: 218 lbs         Attempted call to patient and unable to reach.  Left detailed message per DPR regarding transmission.  Transmission results reviewed.    CorVue thoracic impedance suggesting possible fluid accumulation starting 1/31.   Prescribed: Furosemide 40 mg take 1 tablet daily. Spironolactone 25 mg take 1 tablet daily   Labs: 04/15/2023 Creatinine 1.10, BUN 8,   Potassium 3.9, Sodium 135, GFR >60 10/08/2022 Creatinine 1.18, BUN 8,   Potassium 3.7, Sodium 135, GFR >60 09/02/2022 Creatinine 1.28, BUN 6,   Potassium 4.0, Sodium 136, GFR 59 02/06/2022 Creatinine 1.32, BUN 25, Potassium 4.1, Sodium 138, GFR 57 A complete set of results can be found in Results Review.   Recommendations:  Left voice mail with ICM number and encouraged to call if experiencing any fluid symptoms.  Will send to Dr Wyline Mood for review if patient is reached.   Follow-up plan: ICM clinic phone appointment on 10/19/2023 to recheck fluid levels   91 day device clinic remote transmission 11/16/2023.     EP/Cardiology Office Visits: 12/10/2023 with Dr Wyline Mood.   Recall 09/03/2024 with Dr. Ladona Ridgel.     Copy of ICM check sent to Dr. Ladona Ridgel.   3 month ICM trend: 10/12/2023.    12-14 Month ICM trend:     Karie Soda, RN 10/13/2023 7:42 AM

## 2023-10-13 NOTE — Telephone Encounter (Signed)
Remote ICM transmission received.  Attempted call to patient regarding ICM remote transmission and left detailed message per DPR.  Left ICM phone number and advised to return call for any fluid symptoms or questions. Next ICM remote transmission scheduled 10/19/2023.

## 2023-10-14 ENCOUNTER — Other Ambulatory Visit: Payer: Self-pay | Admitting: Pulmonary Disease

## 2023-10-19 ENCOUNTER — Other Ambulatory Visit: Payer: Self-pay

## 2023-10-19 ENCOUNTER — Ambulatory Visit (HOSPITAL_COMMUNITY): Payer: Medicare HMO | Attending: Hematology

## 2023-10-19 ENCOUNTER — Inpatient Hospital Stay (INDEPENDENT_AMBULATORY_CARE_PROVIDER_SITE_OTHER): Payer: Medicare HMO

## 2023-10-19 ENCOUNTER — Inpatient Hospital Stay (HOSPITAL_COMMUNITY)
Admission: EM | Admit: 2023-10-19 | Discharge: 2023-10-22 | DRG: 291 | Disposition: A | Discharge status: Home Health | Payer: Medicare HMO | Attending: Internal Medicine | Admitting: Internal Medicine

## 2023-10-19 ENCOUNTER — Inpatient Hospital Stay: Payer: Medicare HMO

## 2023-10-19 ENCOUNTER — Emergency Department (HOSPITAL_COMMUNITY): Payer: Medicare HMO

## 2023-10-19 ENCOUNTER — Encounter (HOSPITAL_COMMUNITY): Payer: Self-pay

## 2023-10-19 DIAGNOSIS — K219 Gastro-esophageal reflux disease without esophagitis: Secondary | ICD-10-CM | POA: Diagnosis present

## 2023-10-19 DIAGNOSIS — Z8249 Family history of ischemic heart disease and other diseases of the circulatory system: Secondary | ICD-10-CM

## 2023-10-19 DIAGNOSIS — I5023 Acute on chronic systolic (congestive) heart failure: Secondary | ICD-10-CM | POA: Diagnosis not present

## 2023-10-19 DIAGNOSIS — Z86718 Personal history of other venous thrombosis and embolism: Secondary | ICD-10-CM

## 2023-10-19 DIAGNOSIS — J4489 Other specified chronic obstructive pulmonary disease: Secondary | ICD-10-CM | POA: Diagnosis present

## 2023-10-19 DIAGNOSIS — I5022 Chronic systolic (congestive) heart failure: Secondary | ICD-10-CM | POA: Diagnosis present

## 2023-10-19 DIAGNOSIS — F419 Anxiety disorder, unspecified: Secondary | ICD-10-CM | POA: Diagnosis present

## 2023-10-19 DIAGNOSIS — M109 Gout, unspecified: Secondary | ICD-10-CM | POA: Diagnosis present

## 2023-10-19 DIAGNOSIS — I428 Other cardiomyopathies: Secondary | ICD-10-CM | POA: Diagnosis present

## 2023-10-19 DIAGNOSIS — A318 Other mycobacterial infections: Secondary | ICD-10-CM | POA: Diagnosis present

## 2023-10-19 DIAGNOSIS — J449 Chronic obstructive pulmonary disease, unspecified: Secondary | ICD-10-CM | POA: Diagnosis present

## 2023-10-19 DIAGNOSIS — N1831 Chronic kidney disease, stage 3a: Secondary | ICD-10-CM | POA: Diagnosis present

## 2023-10-19 DIAGNOSIS — R0600 Dyspnea, unspecified: Secondary | ICD-10-CM | POA: Diagnosis present

## 2023-10-19 DIAGNOSIS — Z08 Encounter for follow-up examination after completed treatment for malignant neoplasm: Secondary | ICD-10-CM | POA: Insufficient documentation

## 2023-10-19 DIAGNOSIS — I272 Pulmonary hypertension, unspecified: Secondary | ICD-10-CM | POA: Diagnosis present

## 2023-10-19 DIAGNOSIS — J9611 Chronic respiratory failure with hypoxia: Secondary | ICD-10-CM | POA: Diagnosis not present

## 2023-10-19 DIAGNOSIS — Z8042 Family history of malignant neoplasm of prostate: Secondary | ICD-10-CM

## 2023-10-19 DIAGNOSIS — Z950 Presence of cardiac pacemaker: Secondary | ICD-10-CM | POA: Diagnosis not present

## 2023-10-19 DIAGNOSIS — N179 Acute kidney failure, unspecified: Secondary | ICD-10-CM | POA: Diagnosis not present

## 2023-10-19 DIAGNOSIS — G8929 Other chronic pain: Secondary | ICD-10-CM | POA: Diagnosis present

## 2023-10-19 DIAGNOSIS — I2510 Atherosclerotic heart disease of native coronary artery without angina pectoris: Secondary | ICD-10-CM | POA: Diagnosis present

## 2023-10-19 DIAGNOSIS — N401 Enlarged prostate with lower urinary tract symptoms: Secondary | ICD-10-CM | POA: Diagnosis not present

## 2023-10-19 DIAGNOSIS — R9389 Abnormal findings on diagnostic imaging of other specified body structures: Secondary | ICD-10-CM | POA: Diagnosis not present

## 2023-10-19 DIAGNOSIS — Z8673 Personal history of transient ischemic attack (TIA), and cerebral infarction without residual deficits: Secondary | ICD-10-CM | POA: Diagnosis not present

## 2023-10-19 DIAGNOSIS — I130 Hypertensive heart and chronic kidney disease with heart failure and stage 1 through stage 4 chronic kidney disease, or unspecified chronic kidney disease: Secondary | ICD-10-CM | POA: Diagnosis not present

## 2023-10-19 DIAGNOSIS — E1165 Type 2 diabetes mellitus with hyperglycemia: Secondary | ICD-10-CM | POA: Diagnosis not present

## 2023-10-19 DIAGNOSIS — E1122 Type 2 diabetes mellitus with diabetic chronic kidney disease: Secondary | ICD-10-CM | POA: Diagnosis present

## 2023-10-19 DIAGNOSIS — A319 Mycobacterial infection, unspecified: Secondary | ICD-10-CM | POA: Diagnosis not present

## 2023-10-19 DIAGNOSIS — I2782 Chronic pulmonary embolism: Secondary | ICD-10-CM | POA: Diagnosis not present

## 2023-10-19 DIAGNOSIS — Z85118 Personal history of other malignant neoplasm of bronchus and lung: Secondary | ICD-10-CM

## 2023-10-19 DIAGNOSIS — C3490 Malignant neoplasm of unspecified part of unspecified bronchus or lung: Secondary | ICD-10-CM

## 2023-10-19 DIAGNOSIS — R079 Chest pain, unspecified: Secondary | ICD-10-CM | POA: Diagnosis not present

## 2023-10-19 DIAGNOSIS — I509 Heart failure, unspecified: Principal | ICD-10-CM

## 2023-10-19 DIAGNOSIS — R0609 Other forms of dyspnea: Secondary | ICD-10-CM | POA: Diagnosis not present

## 2023-10-19 DIAGNOSIS — Z95810 Presence of automatic (implantable) cardiac defibrillator: Secondary | ICD-10-CM

## 2023-10-19 DIAGNOSIS — R0789 Other chest pain: Secondary | ICD-10-CM | POA: Diagnosis not present

## 2023-10-19 DIAGNOSIS — J9621 Acute and chronic respiratory failure with hypoxia: Secondary | ICD-10-CM | POA: Diagnosis not present

## 2023-10-19 DIAGNOSIS — I110 Hypertensive heart disease with heart failure: Secondary | ICD-10-CM | POA: Diagnosis not present

## 2023-10-19 DIAGNOSIS — J479 Bronchiectasis, uncomplicated: Secondary | ICD-10-CM

## 2023-10-19 DIAGNOSIS — E7800 Pure hypercholesterolemia, unspecified: Secondary | ICD-10-CM | POA: Diagnosis present

## 2023-10-19 DIAGNOSIS — I471 Supraventricular tachycardia, unspecified: Secondary | ICD-10-CM | POA: Diagnosis present

## 2023-10-19 DIAGNOSIS — Z888 Allergy status to other drugs, medicaments and biological substances status: Secondary | ICD-10-CM

## 2023-10-19 DIAGNOSIS — Z87891 Personal history of nicotine dependence: Secondary | ICD-10-CM

## 2023-10-19 DIAGNOSIS — I10 Essential (primary) hypertension: Secondary | ICD-10-CM | POA: Diagnosis present

## 2023-10-19 DIAGNOSIS — I5032 Chronic diastolic (congestive) heart failure: Secondary | ICD-10-CM | POA: Diagnosis not present

## 2023-10-19 DIAGNOSIS — Z66 Do not resuscitate: Secondary | ICD-10-CM | POA: Diagnosis not present

## 2023-10-19 DIAGNOSIS — Z794 Long term (current) use of insulin: Secondary | ICD-10-CM

## 2023-10-19 DIAGNOSIS — Z1152 Encounter for screening for COVID-19: Secondary | ICD-10-CM | POA: Diagnosis not present

## 2023-10-19 DIAGNOSIS — Z79899 Other long term (current) drug therapy: Secondary | ICD-10-CM

## 2023-10-19 DIAGNOSIS — Z923 Personal history of irradiation: Secondary | ICD-10-CM

## 2023-10-19 DIAGNOSIS — Z9981 Dependence on supplemental oxygen: Secondary | ICD-10-CM

## 2023-10-19 DIAGNOSIS — H908 Mixed conductive and sensorineural hearing loss, unspecified: Secondary | ICD-10-CM | POA: Diagnosis present

## 2023-10-19 DIAGNOSIS — Z7901 Long term (current) use of anticoagulants: Secondary | ICD-10-CM

## 2023-10-19 DIAGNOSIS — Z7951 Long term (current) use of inhaled steroids: Secondary | ICD-10-CM

## 2023-10-19 LAB — CBC WITH DIFFERENTIAL/PLATELET
Abs Immature Granulocytes: 0.12 10*3/uL — ABNORMAL HIGH (ref 0.00–0.07)
Abs Immature Granulocytes: 0.11 10*3/uL — ABNORMAL HIGH (ref 0.00–0.07)
Basophils Absolute: 0 10*3/uL (ref 0.0–0.1)
Basophils Absolute: 0.1 10*3/uL (ref 0.0–0.1)
Basophils Relative: 0 %
Basophils Relative: 1 %
Eosinophils Absolute: 0.1 10*3/uL (ref 0.0–0.5)
Eosinophils Absolute: 0.1 10*3/uL (ref 0.0–0.5)
Eosinophils Relative: 1 %
Eosinophils Relative: 1 %
HCT: 49 % (ref 39.0–52.0)
HCT: 49.6 % (ref 39.0–52.0)
Hemoglobin: 15.8 g/dL (ref 13.0–17.0)
Hemoglobin: 15.8 g/dL (ref 13.0–17.0)
Immature Granulocytes: 1 %
Immature Granulocytes: 1 %
Lymphocytes Relative: 21 %
Lymphocytes Relative: 19 %
Lymphs Abs: 1.9 10*3/uL (ref 0.7–4.0)
Lymphs Abs: 1.8 10*3/uL (ref 0.7–4.0)
MCH: 30.2 pg (ref 26.0–34.0)
MCH: 29.9 pg (ref 26.0–34.0)
MCHC: 32.2 g/dL (ref 30.0–36.0)
MCHC: 31.9 g/dL (ref 30.0–36.0)
MCV: 93.5 fL (ref 80.0–100.0)
MCV: 93.9 fL (ref 80.0–100.0)
Monocytes Absolute: 0.7 10*3/uL (ref 0.1–1.0)
Monocytes Absolute: 0.7 10*3/uL (ref 0.1–1.0)
Monocytes Relative: 8 %
Monocytes Relative: 7 %
Neutro Abs: 6.2 10*3/uL (ref 1.7–7.7)
Neutro Abs: 6.7 10*3/uL (ref 1.7–7.7)
Neutrophils Relative %: 69 %
Neutrophils Relative %: 71 %
Platelets: 201 10*3/uL (ref 150–400)
Platelets: 202 10*3/uL (ref 150–400)
RBC: 5.24 MIL/uL (ref 4.22–5.81)
RBC: 5.28 MIL/uL (ref 4.22–5.81)
RDW: 14.1 % (ref 11.5–15.5)
RDW: 14.1 % (ref 11.5–15.5)
WBC: 9 10*3/uL (ref 4.0–10.5)
WBC: 9.4 10*3/uL (ref 4.0–10.5)
nRBC: 0 % (ref 0.0–0.2)
nRBC: 0 % (ref 0.0–0.2)

## 2023-10-19 LAB — BASIC METABOLIC PANEL
Anion gap: 13 (ref 5–15)
BUN: 10 mg/dL (ref 8–23)
CO2: 25 mmol/L (ref 22–32)
Calcium: 10.2 mg/dL (ref 8.9–10.3)
Chloride: 99 mmol/L (ref 98–111)
Creatinine, Ser: 1.29 mg/dL — ABNORMAL HIGH (ref 0.61–1.24)
GFR, Estimated: 58 mL/min — ABNORMAL LOW (ref >60)
Glucose, Bld: 305 mg/dL — ABNORMAL HIGH (ref 70–99)
Potassium: 4.3 mmol/L (ref 3.5–5.1)
Sodium: 137 mmol/L (ref 135–145)

## 2023-10-19 LAB — COMPREHENSIVE METABOLIC PANEL
ALT: 13 U/L (ref 0–44)
AST: 16 U/L (ref 15–41)
Albumin: 3.5 g/dL (ref 3.5–5.0)
Alkaline Phosphatase: 89 U/L (ref 38–126)
Anion gap: 12 (ref 5–15)
BUN: 10 mg/dL (ref 8–23)
CO2: 26 mmol/L (ref 22–32)
Calcium: 9.9 mg/dL (ref 8.9–10.3)
Chloride: 98 mmol/L (ref 98–111)
Creatinine, Ser: 1.23 mg/dL (ref 0.61–1.24)
GFR, Estimated: >60 mL/min (ref >60)
Glucose, Bld: 308 mg/dL — ABNORMAL HIGH (ref 70–99)
Potassium: 4.1 mmol/L (ref 3.5–5.1)
Sodium: 136 mmol/L (ref 135–145)
Total Bilirubin: 0.9 mg/dL (ref 0.0–1.2)
Total Protein: 8.2 g/dL — ABNORMAL HIGH (ref 6.5–8.1)

## 2023-10-19 LAB — BRAIN NATRIURETIC PEPTIDE: B Natriuretic Peptide: 58 pg/mL (ref 0.0–100.0)

## 2023-10-19 LAB — CBG MONITORING, ED: Glucose-Capillary: 286 mg/dL — ABNORMAL HIGH (ref 70–99)

## 2023-10-19 LAB — MAGNESIUM: Magnesium: 1.9 mg/dL (ref 1.7–2.4)

## 2023-10-19 LAB — GLUCOSE, CAPILLARY: Glucose-Capillary: 222 mg/dL — ABNORMAL HIGH (ref 70–99)

## 2023-10-19 LAB — RESP PANEL BY RT-PCR (RSV, FLU A&B, COVID)  RVPGX2
Influenza A by PCR: NEGATIVE
Influenza B by PCR: NEGATIVE
Resp Syncytial Virus by PCR: NEGATIVE
SARS Coronavirus 2 by RT PCR: NEGATIVE

## 2023-10-19 LAB — TROPONIN I (HIGH SENSITIVITY)
Troponin I (High Sensitivity): 22 ng/L — ABNORMAL HIGH (ref <18)
Troponin I (High Sensitivity): 21 ng/L — ABNORMAL HIGH (ref <18)

## 2023-10-19 LAB — HEMOGLOBIN A1C
Hgb A1c MFr Bld: 10.9 % — ABNORMAL HIGH (ref 4.8–5.6)
Mean Plasma Glucose: 266.13 mg/dL

## 2023-10-19 MED ORDER — TAMSULOSIN HCL 0.4 MG PO CAPS
0.4000 mg | ORAL_CAPSULE | Freq: Every day | ORAL | Status: DC
  Administered 2023-10-20 – 2023-10-22 (×3): 0.4 mg via ORAL
  Filled 2023-10-19 (×3): qty 1

## 2023-10-19 MED ORDER — FUROSEMIDE 10 MG/ML IJ SOLN
40.0000 mg | Freq: Once | INTRAMUSCULAR | Status: AC
  Administered 2023-10-19: 40 mg via INTRAVENOUS
  Filled 2023-10-19: qty 4

## 2023-10-19 MED ORDER — IPRATROPIUM-ALBUTEROL 0.5-2.5 (3) MG/3ML IN SOLN: 3.0000 mL | Freq: Four times a day (QID) | RESPIRATORY_TRACT | Status: DC | PRN

## 2023-10-19 MED ORDER — ARFORMOTEROL TARTRATE 15 MCG/2ML IN NEBU
15.0000 ug | INHALATION_SOLUTION | Freq: Two times a day (BID) | RESPIRATORY_TRACT | Status: DC
  Administered 2023-10-20 – 2023-10-22 (×5): 15 ug via RESPIRATORY_TRACT
  Filled 2023-10-19 (×5): qty 2

## 2023-10-19 MED ORDER — INSULIN ASPART 100 UNIT/ML IJ SOLN: 0.0000 [IU] | Freq: Every day | INTRAMUSCULAR | Status: DC

## 2023-10-19 MED ORDER — INSULIN GLARGINE-YFGN 100 UNIT/ML ~~LOC~~ SOLN
30.0000 [IU] | Freq: Every day | SUBCUTANEOUS | Status: DC
  Administered 2023-10-19 – 2023-10-21 (×3): 30 [IU] via SUBCUTANEOUS
  Filled 2023-10-19 (×4): qty 0.3

## 2023-10-19 MED ORDER — ALBUTEROL SULFATE (2.5 MG/3ML) 0.083% IN NEBU: 3.0000 mL | INHALATION_SOLUTION | Freq: Four times a day (QID) | RESPIRATORY_TRACT | Status: DC | PRN

## 2023-10-19 MED ORDER — SPIRONOLACTONE 25 MG PO TABS
25.0000 mg | ORAL_TABLET | Freq: Every day | ORAL | Status: DC
  Administered 2023-10-20 – 2023-10-22 (×3): 25 mg via ORAL
  Filled 2023-10-19 (×3): qty 1

## 2023-10-19 MED ORDER — ATORVASTATIN CALCIUM 40 MG PO TABS
40.0000 mg | ORAL_TABLET | Freq: Every day | ORAL | Status: DC
  Administered 2023-10-19 – 2023-10-21 (×3): 40 mg via ORAL
  Filled 2023-10-19 (×3): qty 1

## 2023-10-19 MED ORDER — TIOTROPIUM BROMIDE MONOHYDRATE 2.5 MCG/ACT IN AERS: 2.0000 | INHALATION_SPRAY | Freq: Every day | RESPIRATORY_TRACT | Status: DC

## 2023-10-19 MED ORDER — AZITHROMYCIN 250 MG PO TABS
500.0000 mg | ORAL_TABLET | ORAL | Status: DC
  Administered 2023-10-19 – 2023-10-21 (×2): 500 mg via ORAL
  Filled 2023-10-19 (×3): qty 2

## 2023-10-19 MED ORDER — ASPIRIN 81 MG PO CHEW
324.0000 mg | CHEWABLE_TABLET | Freq: Once | ORAL | Status: AC
  Administered 2023-10-19: 324 mg via ORAL
  Filled 2023-10-19: qty 4

## 2023-10-19 MED ORDER — GABAPENTIN 100 MG PO CAPS
100.0000 mg | ORAL_CAPSULE | Freq: Three times a day (TID) | ORAL | Status: DC
  Administered 2023-10-19 – 2023-10-22 (×8): 100 mg via ORAL
  Filled 2023-10-19 (×8): qty 1

## 2023-10-19 MED ORDER — AMITRIPTYLINE HCL 25 MG PO TABS
50.0000 mg | ORAL_TABLET | Freq: Two times a day (BID) | ORAL | Status: DC
  Administered 2023-10-19 – 2023-10-22 (×6): 50 mg via ORAL
  Filled 2023-10-19 (×6): qty 2

## 2023-10-19 MED ORDER — INSULIN ASPART 100 UNIT/ML IJ SOLN
0.0000 [IU] | Freq: Three times a day (TID) | INTRAMUSCULAR | Status: DC
  Administered 2023-10-20 (×2): 8 [IU] via SUBCUTANEOUS
  Administered 2023-10-21: 3 [IU] via SUBCUTANEOUS
  Administered 2023-10-22: 2 [IU] via SUBCUTANEOUS
  Administered 2023-10-22: 5 [IU] via SUBCUTANEOUS

## 2023-10-19 MED ORDER — CARVEDILOL 12.5 MG PO TABS
37.5000 mg | ORAL_TABLET | Freq: Two times a day (BID) | ORAL | Status: DC
  Administered 2023-10-19 – 2023-10-22 (×6): 37.5 mg via ORAL
  Filled 2023-10-19 (×7): qty 3

## 2023-10-19 MED ORDER — BUDESONIDE 0.5 MG/2ML IN SUSP
0.5000 mg | Freq: Two times a day (BID) | RESPIRATORY_TRACT | Status: DC
  Administered 2023-10-20 – 2023-10-22 (×5): 0.5 mg via RESPIRATORY_TRACT
  Filled 2023-10-19 (×5): qty 2

## 2023-10-19 MED ORDER — RIVAROXABAN 20 MG PO TABS
20.0000 mg | ORAL_TABLET | Freq: Every morning | ORAL | Status: DC
  Administered 2023-10-20 – 2023-10-22 (×3): 20 mg via ORAL
  Filled 2023-10-19 (×3): qty 1

## 2023-10-19 MED ORDER — FENTANYL CITRATE PF 50 MCG/ML IJ SOSY
50.0000 ug | PREFILLED_SYRINGE | Freq: Once | INTRAMUSCULAR | Status: AC
  Administered 2023-10-19: 50 ug via INTRAVENOUS
  Filled 2023-10-19: qty 1

## 2023-10-19 MED ORDER — MAGNESIUM SULFATE 2 GM/50ML IV SOLN
2.0000 g | Freq: Once | INTRAVENOUS | Status: AC
  Administered 2023-10-19: 2 g via INTRAVENOUS
  Filled 2023-10-19: qty 50

## 2023-10-19 MED ORDER — HYDRALAZINE HCL 25 MG PO TABS
75.0000 mg | ORAL_TABLET | Freq: Three times a day (TID) | ORAL | Status: DC
  Administered 2023-10-19 – 2023-10-22 (×7): 75 mg via ORAL
  Filled 2023-10-19 (×9): qty 3

## 2023-10-19 MED ORDER — UMECLIDINIUM BROMIDE 62.5 MCG/ACT IN AEPB
1.0000 | INHALATION_SPRAY | Freq: Every day | RESPIRATORY_TRACT | Status: DC
  Administered 2023-10-20 – 2023-10-22 (×3): 1 via RESPIRATORY_TRACT
  Filled 2023-10-19: qty 7

## 2023-10-19 MED ORDER — ALPRAZOLAM 0.5 MG PO TABS
0.5000 mg | ORAL_TABLET | Freq: Two times a day (BID) | ORAL | Status: DC | PRN
  Administered 2023-10-19 – 2023-10-21 (×3): 0.5 mg via ORAL
  Filled 2023-10-19 (×3): qty 1

## 2023-10-19 MED ORDER — METOPROLOL TARTRATE 5 MG/5ML IV SOLN
5.0000 mg | Freq: Once | INTRAVENOUS | Status: AC
  Administered 2023-10-19: 5 mg via INTRAVENOUS
  Filled 2023-10-19: qty 5

## 2023-10-19 MED ORDER — ALLOPURINOL 100 MG PO TABS
100.0000 mg | ORAL_TABLET | Freq: Every day | ORAL | Status: DC
  Administered 2023-10-20 – 2023-10-22 (×3): 100 mg via ORAL
  Filled 2023-10-19 (×3): qty 1

## 2023-10-19 MED ORDER — PANTOPRAZOLE SODIUM 40 MG PO TBEC
40.0000 mg | DELAYED_RELEASE_TABLET | Freq: Two times a day (BID) | ORAL | Status: DC
  Administered 2023-10-19 – 2023-10-22 (×6): 40 mg via ORAL
  Filled 2023-10-19 (×6): qty 1

## 2023-10-19 MED ORDER — INSULIN ASPART 100 UNIT/ML IJ SOLN
5.0000 [IU] | Freq: Once | INTRAMUSCULAR | Status: AC
  Administered 2023-10-19: 5 [IU] via SUBCUTANEOUS
  Filled 2023-10-19: qty 1

## 2023-10-19 MED ORDER — IPRATROPIUM-ALBUTEROL 0.5-2.5 (3) MG/3ML IN SOLN
3.0000 mL | Freq: Once | RESPIRATORY_TRACT | Status: AC
  Administered 2023-10-19: 3 mL via RESPIRATORY_TRACT
  Filled 2023-10-19: qty 3

## 2023-10-19 NOTE — Assessment & Plan Note (Signed)
History of PE and DVT.   - Reports compliance with Xarelto, resume
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
## 2023-10-19 NOTE — Assessment & Plan Note (Signed)
Presenting with dyspnea and chest heaviness, baseline history of CHF, and bronchiectasis on chronic azithromycin.  Not hypoxic.  Chest x-ray clear.  On Xarelto doubt pulmonary embolism.  Pending EKG unremarkable.  BNP- 58 WNL.  -IV Lasix x 2 further doses pending clinical course

## 2023-10-19 NOTE — Assessment & Plan Note (Signed)
Blood glucose 308 - HgbA1c - SSI- M -Resume home Levemir at reduced dose 30 units nightly (dose 50 nightly with NovoLog 25 3 times daily)

## 2023-10-19 NOTE — Assessment & Plan Note (Signed)
CHF with improved EF.  LV EF as low as 20-25%, normalized after BiV pacemaker placement.  Reports dyspnea and chest heaviness over the past 2 weeks.  Not hypoxic.  CorVue thoracic impedance -suggest fluid accumulation.  But BNP unremarkable at 58.  And chest x-ray without acute abnormality.  Patient reports normal weight of 205, weight here today 218 pounds.  No peripheral signs of volume overload my evaluation.  Last echo on file from 2021 EF of 55 to 60%. - Troponin 22> 21.  EKG without acute abnormalities. -Cardiology consulted in ED, IV Lasix 40 mg x 2 doses given today( See note)

## 2023-10-19 NOTE — ED Triage Notes (Signed)
Pt arrived via POV from home c/o worsening SOB for the past few days. Pt endorses generalized heaviness in his chest as well. Pt presents on 3L Nasal Cannula at baseline.

## 2023-10-19 NOTE — Assessment & Plan Note (Signed)
 Resume allopurinol

## 2023-10-19 NOTE — Assessment & Plan Note (Signed)
On 3 L at baseline.  Here O2 sats 91 to 99% on home 3 L.

## 2023-10-19 NOTE — Assessment & Plan Note (Signed)
Lung exam clear-without wheezing or rhonchi.  Hypoxic. -DuoNebs as needed -Resume home regimen

## 2023-10-19 NOTE — Assessment & Plan Note (Signed)
Resume chronic azithromycin

## 2023-10-19 NOTE — ED Provider Notes (Signed)
[place] EMERGENCY DEPARTMENT AT [facility] Provider Note   CSN: [id number] Arrival date & time: 10/19/23  1058     History  Chief Complaint  Patient presents with   Shortness of Breath    Roger Lowe. is a 78 y.o. male.   Shortness of Breath Patient presents for shortness of breath.  Medical history includes DM, lung cancer, CKD, COPD, CHF, CAD, chronic pain, GERD, gout, BPH, CVA.  He is on 3 L of supplemental oxygen at baseline.  Per chart review, ICM monitoring has noted thoracic impedance suggesting fluid accumulation for the past 2 weeks.  Patient is currently prescribed Lasix, 40 mg daily.  He is on Xarelto.  He states that he has been adherent to his daily Lasix.  Over the past several days, he has had worsening shortness of breath, diffuse pain, and a heaviness to his chest.  Currently, he endorses shortness of breath at rest.     Home Medications Prior to Admission medications   Medication Sig Start Date End Date Taking? Authorizing Provider  albuterol (VENTOLIN HFA) 108 (90 Base) MCG/ACT inhaler Inhale 1-2 puffs into the lungs every 6 (six) hours as needed for shortness of breath or wheezing. 01/20/22   Icard, Rachel Bo, DO  allopurinol (ZYLOPRIM) 100 MG tablet Take 100 mg by mouth daily.    [provider]  ALPRAZolam Prudy Feeler) 0.25 MG tablet Take 0.25 mg by mouth at bedtime.    [provider]  amitriptyline (ELAVIL) 50 MG tablet Take 50 mg by mouth 2 (two) times daily.     [provider]  amLODipine (NORVASC) 5 MG tablet Take 5 mg by mouth daily. 03/03/23   [provider]  AREXVY 120 MCG/0.5ML injection  05/29/22   [provider]  arformoterol (BROVANA) 15 MCG/2ML NEBU USE 1 VIAL  IN  NEBULIZER TWICE  DAILY - Morning And Evening 10/28/21   Glenford Bayley, NP  atorvastatin (LIPITOR) 40 MG tablet Take 40 mg by mouth at bedtime. 09/12/20   [provider]  azithromycin (ZITHROMAX) 500 MG tablet TAKE 1  TABLET BY MOUTH ON MONDAY, [facility] AND FRIDAY 10/16/23   Mannam, Praveen, MD  budesonide (PULMICORT) 0.5 MG/2ML nebulizer solution USE 1 VIAL  IN  NEBULIZER TWICE  DAILY - Rinse Mouth After Treatment 10/28/21   Glenford Bayley, NP  carvedilol (COREG) 25 MG tablet TAKE 1 TABLET TWICE DAILY 01/05/23   Antoine Poche, MD  doxycycline (VIBRA-TABS) 100 MG tablet Take 100 mg by mouth 2 (two) times daily. 05/11/23   [provider]  ergocalciferol (VITAMIN D2) 1.25 MG (50000 UT) capsule Take 50,000 Units by mouth every Tuesday.    [provider]  FARXIGA 10 MG TABS tablet Take 10 mg by mouth daily. 10/09/21   [provider]  furosemide (LASIX) 40 MG tablet Take 40 mg by mouth daily.    [provider]  gabapentin (NEURONTIN) 100 MG capsule Take 100 mg by mouth 3 (three) times daily.  10/28/19   [provider]  hydrALAZINE (APRESOLINE) 50 MG tablet Take 1.5 tablets (75 mg total) by mouth 3 (three) times daily. 01/15/23 09/09/23  Antoine Poche, MD  Insulin Detemir (LEVEMIR FLEXTOUCH) 100 UNIT/ML Pen Inject 45 Units into the skin at bedtime. Patient taking differently: Inject 50 Units into the skin at bedtime. 11/03/18   Strader, Lennart Pall, PA-C  ipratropium-albuterol (DUONEB) 0.5-2.5 (3) MG/3ML SOLN USE 1 VIAL IN NEBULIZER EVERY 6 HOURS - And As  Needed (For Rescue -MAX 30 DOSES PER MONTH) Patient taking differently: Take 3 mLs by nebulization every 6 (six) hours as needed (Asthma). USE 1 VIAL IN NEBULIZER EVERY 6 HOURS As Needed (For Rescue -MAX 30 DOSES PER MONTH) 12/23/21   Glenford Bayley, NP  ketorolac (TORADOL) 30 MG/ML injection Inject 30 mg into the muscle daily as needed for moderate pain. 01/24/22   [provider]  methocarbamol (ROBAXIN) 750 MG tablet Take 750 mg by mouth 2 (two) times daily as needed for muscle spasms. 03/27/21   [provider]  methylPREDNISolone (MEDROL) 4 MG tablet Take 8 mg by mouth daily as needed (pain in  back). 01/29/22   [provider]  nitroGLYCERIN (NITROSTAT) 0.4 MG SL tablet PLACE 1 TABLET UNDER THE TONGUE EVERY 5 (FIVE) MINUTES AS NEEDED FOR CHEST PAIN  AS DIRECTED 08/06/20   Strader, Lennart Pall, PA-C  NOVOLOG FLEXPEN 100 UNIT/ML FlexPen Inject 25-40 Units into the skin 3 (three) times daily with meals. Sliding scale 02/21/20   [provider]  OVER THE COUNTER MEDICATION Compression vest     [provider]  oxyCODONE-acetaminophen (PERCOCET) 10-325 MG tablet Take 1 tablet by mouth every 6 (six) hours as needed for pain.    [provider]  OXYGEN Inhale 2-4 L into the lungs continuous.    [provider]  pantoprazole (PROTONIX) 40 MG tablet Take 40 mg by mouth in the morning and at bedtime.    [provider]  RELION PEN NEEDLES 31G X 6 MM MISC USE 1 PEN NEEDLE 4 TIMES DAILY 08/12/19   [provider]  rivaroxaban (XARELTO) 20 MG TABS tablet Take 1 tablet (20 mg total) by mouth every morning. 02/03/20   Antoine Poche, MD  sodium chloride (OCEAN) 0.65 % SOLN nasal spray Place 1 spray into both nostrils as needed for congestion.    [provider]  SPIRIVA RESPIMAT 2.5 MCG/ACT AERS INHALE 2 PUFFS INTO THE LUNGS DAILY. 06/02/23   Icard, Rachel Bo, DO  spironolactone (ALDACTONE) 25 MG tablet TAKE 1 TABLET (25 MG TOTAL) BY MOUTH DAILY. 02/24/22 09/09/23  Antoine Poche, MD  tamsulosin (FLOMAX) 0.4 MG CAPS capsule Take 1 capsule (0.4 mg total) by mouth daily. 02/24/23   Donnita Falls, FNP      Allergies    Entresto [sacubitril-valsartan] and Ace inhibitors    Review of Systems   Review of Systems  Constitutional:  Positive for fatigue.  Respiratory:  Positive for chest tightness and shortness of breath.   All other systems reviewed and are negative.   Physical Exam Updated Vital Signs BP (!) 136/96   Pulse 74   Temp 97.8 F (36.6 C) (Oral)   Resp 18   Ht 6' (1.829 m)   Wt 99.3 kg   SpO2 97%   BMI 29.69  kg/m  Physical Exam Vitals and nursing note reviewed.  Constitutional:      General: He is not in acute distress.    Appearance: He is well-developed. He is not ill-appearing, toxic-appearing or diaphoretic.  HENT:     Head: Normocephalic and atraumatic.     Mouth/Throat:     Mouth: Mucous membranes are moist.  Eyes:     Conjunctiva/sclera: Conjunctivae normal.  Cardiovascular:     Rate and Rhythm: Regular rhythm. Tachycardia present.     Heart sounds: No murmur heard. Pulmonary:     Effort: Pulmonary effort is normal. Tachypnea present. No respiratory distress.  Breath sounds: Decreased breath sounds present.  Chest:     Chest wall: No tenderness.  Abdominal:     Palpations: Abdomen is soft.     Tenderness: There is no abdominal tenderness.  Musculoskeletal:        General: No swelling. Normal range of motion.     Cervical back: Neck supple.  Skin:    General: Skin is warm and dry.     Capillary Refill: Capillary refill takes less than 2 seconds.     Coloration: Skin is not cyanotic or pale.  Neurological:     General: No focal deficit present.     Mental Status: He is alert and oriented to person, place, and time.  Psychiatric:        Mood and Affect: Mood normal.        Behavior: Behavior normal.     ED Results / Procedures / Treatments   Labs (all labs ordered are listed, but only abnormal results are displayed) Labs Reviewed  BASIC METABOLIC PANEL - Abnormal; Notable for the following components:      Result Value   Glucose, Bld 305 (*)    Creatinine, Ser 1.29 (*)    GFR, Estimated 58 (*)    All other components within normal limits  CBC WITH DIFFERENTIAL/PLATELET - Abnormal; Notable for the following components:   Abs Immature Granulocytes 0.11 (*)    All other components within normal limits  CBG MONITORING, ED - Abnormal; Notable for the following components:   Glucose-Capillary 286 (*)    All other components within normal limits  TROPONIN I (HIGH  SENSITIVITY) - Abnormal; Notable for the following components:   Troponin I (High Sensitivity) 22 (*)    All other components within normal limits  TROPONIN I (HIGH SENSITIVITY) - Abnormal; Notable for the following components:   Troponin I (High Sensitivity) 21 (*)    All other components within normal limits  RESP PANEL BY RT-PCR (RSV, FLU A&B, COVID)  RVPGX2  BRAIN NATRIURETIC PEPTIDE  MAGNESIUM    EKG EKG Interpretation Date/Time:  Monday October 19 2023 11:15:11 EST Ventricular Rate:  67 PR Interval:  184 QRS Duration:  130 QT Interval:  416 QTC Calculation: 439 R Axis:   253  Text Interpretation: Atrial-sensed ventricular-paced rhythm Confirmed by Gloris Manchester [phone]) on 10/19/2023 11:46:09 AM  Radiology DG Chest 2 View Result Date: 10/19/2023 CLINICAL DATA:  Chest pain EXAM: CHEST - 2 VIEW COMPARISON:  07/29/2023. FINDINGS: Bilateral lung fields are clear. Bilateral costophrenic angles are clear. Note is made of elevated right hemidiaphragm. Normal cardio-mediastinal silhouette. There is a left sided 3-lead pacemaker. No acute osseous abnormalities. The soft tissues are within normal limits. IMPRESSION: No active cardiopulmonary disease. Electronically Signed   By: Jules Schick M.D.   On: 10/19/2023 13:36    Procedures Procedures    Medications Ordered in ED Medications  carvedilol (COREG) tablet 37.5 mg (37.5 mg Oral Given 10/19/23 1632)  furosemide (LASIX) injection 40 mg (has no administration in time range)  magnesium sulfate IVPB 2 g 50 mL (2 g Intravenous New Bag/Given 10/19/23 1631)  aspirin chewable tablet 324 mg (324 mg Oral Given 10/19/23 1230)  fentaNYL (SUBLIMAZE) injection 50 mcg (50 mcg Intravenous Given 10/19/23 1230)  furosemide (LASIX) injection 40 mg (40 mg Intravenous Given 10/19/23 1353)  insulin aspart (novoLOG) injection 5 Units (5 Units Subcutaneous Given 10/19/23 1353)  ipratropium-albuterol (DUONEB) 0.5-2.5 (3) MG/3ML nebulizer solution 3 mL (3 mLs  Nebulization Given 10/19/23 1601)  metoprolol  tartrate (LOPRESSOR) injection 5 mg (5 mg Intravenous Given 10/19/23 1625)    ED Course/ Medical Decision Making/ A&P                                 Medical Decision Making Amount and/or Complexity of Data Reviewed Labs: ordered. Radiology: ordered.  Risk OTC drugs. Prescription drug management. Decision regarding hospitalization.   This patient presents to the ED for concern of shortness of breath, this involves an extensive number of treatment options, and is a complaint that carries with it a high risk of complications and morbidity.  The differential diagnosis includes CHF exacerbation, COPD, ACS, anemia, acidosis, other metabolic derangements, arrhythmia   Co morbidities that complicate the patient evaluation  DM, lung cancer, CKD, COPD, CHF, CAD, chronic pain, GERD, gout, BPH, CVA   Additional history obtained:  Additional history obtained from N/A External records from outside source obtained and reviewed including EMR   Lab Tests:  I Ordered, and personally interpreted labs.  The pertinent results include: Baseline creatinine, normal electrolytes, normal hemoglobin, no leukocytosis, normal BNP, high-normal troponin.   Imaging Studies ordered:  I ordered imaging studies including chest x-ray I independently visualized and interpreted imaging which showed no acute findings I agree with the radiologist interpretation   Cardiac Monitoring: / EKG:  The patient was maintained on a cardiac monitor.  I personally viewed and interpreted the cardiac monitored which showed an underlying rhythm of: Sinus rhythm   Consultations Obtained:  I requested consultation with the cardiologist, Dr. Wyline Mood,  and discussed lab and imaging findings as well as pertinent plan - they recommend: Admission for diuresis and monitoring.  Metoprolol for rate control.   Problem List / ED Course / Critical interventions / Medication  management  Patient presents for shortness of breath and chest pressure.  This has been ongoing for the past several days.  He has not had exercise intolerance.  He reports that he has been adherent to his home Lasix.  On arrival in the ED, blood pressure is moderately elevated.  Although initial heart rate was normal, at time of being bedded in the ED, he did have tachycardia.  On review of prior EKGs, it does seem that he has some changes.  He does still appear to be ventricularly paced.  Patient was kept on bedside cardiac monitor and did have sustained tachycardia.  Initial lab work was unremarkable.  He was given dose of Lasix for diuresis.  I discussed with cardiologist on-call, Dr. Wyline Mood, who has seen this patient in the past.  Dr. Wyline Mood came and evaluated patient at bedside and reviewed cardiac monitor.  He does recommend metoprolol for rate control which she did order.  He recommends admission to hospitalist.  Patient was admitted for further management. I ordered medication including Lasix for diuresis, DuoNeb for COPD. Reevaluation of the patient after these medicines showed that the patient improved I have reviewed the patients home medicines and have made adjustments as needed   Social Determinants of Health:  Has access to outpatient care        Final Clinical Impression(s) / ED Diagnoses Final diagnoses:  Acute on chronic congestive heart failure, unspecified heart failure type [facility])    Rx / DC Orders ED Discharge Orders     None         Gloris Manchester, MD 10/19/23 1707
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
## 2023-10-19 NOTE — Assessment & Plan Note (Signed)
 Resume Flomax

## 2023-10-19 NOTE — Progress Notes (Signed)
EPIC Encounter for ICM Monitoring  Patient Name: Roger Lowe. is a 77 y.o. male Date: 10/19/2023 Primary Care Physican: Benita Stabile, MD Primary Cardiologist: Branch Electrophysiologist: Ladona Ridgel BiV Pacing: 98% 02/11/2023 Weight: 220 lbs 07/03/2023 Weight: 214 lbs 09/09/2023 Office Weight: 218 lbs         Transmission results reviewed.  Pt currently in ED on 2/17.   CorVue thoracic impedance suggesting possible fluid accumulation starting 2/14.  Also possible fluid accumulation from 1/31-2/10.   Prescribed: Furosemide 40 mg take 1 tablet daily. Spironolactone 25 mg take 1 tablet daily   Labs: 04/15/2023 Creatinine 1.10, BUN 8,   Potassium 3.9, Sodium 135, GFR >60 10/08/2022 Creatinine 1.18, BUN 8,   Potassium 3.7, Sodium 135, GFR >60 09/02/2022 Creatinine 1.28, BUN 6,   Potassium 4.0, Sodium 136, GFR 59 02/06/2022 Creatinine 1.32, BUN 25, Potassium 4.1, Sodium 138, GFR 57 A complete set of results can be found in Results Review.   Recommendations:  Pt currently in ED.  Sent to Dr Wyline Mood for as Lorain Childes since patient is currently in ED.   Follow-up plan: ICM clinic phone appointment on 10/27/2023 to recheck fluid levels   91 day device clinic remote transmission 11/16/2023.     EP/Cardiology Office Visits: 12/10/2023 with Dr Wyline Mood.   Recall 09/03/2024 with Dr. Ladona Ridgel.     Copy of ICM check sent to Dr. Ladona Ridgel.   3 month ICM trend: 10/18/2023.    12-14 Month ICM trend:     Karie Soda, RN 10/19/2023 1:08 PM

## 2023-10-19 NOTE — Assessment & Plan Note (Addendum)
On chronic azithromycin per patient for about ~12 years now. -Resume Azithromycin

## 2023-10-19 NOTE — Assessment & Plan Note (Addendum)
Stable.  On several antihypertensives. -Resume carvedilol, tamsulosin, spironolactone -Holding Norvasc for now as he is on several agents

## 2023-10-20 DIAGNOSIS — K219 Gastro-esophageal reflux disease without esophagitis: Secondary | ICD-10-CM | POA: Diagnosis present

## 2023-10-20 DIAGNOSIS — R0600 Dyspnea, unspecified: Secondary | ICD-10-CM

## 2023-10-20 DIAGNOSIS — Z8673 Personal history of transient ischemic attack (TIA), and cerebral infarction without residual deficits: Secondary | ICD-10-CM | POA: Diagnosis not present

## 2023-10-20 DIAGNOSIS — M109 Gout, unspecified: Secondary | ICD-10-CM | POA: Diagnosis present

## 2023-10-20 DIAGNOSIS — I130 Hypertensive heart and chronic kidney disease with heart failure and stage 1 through stage 4 chronic kidney disease, or unspecified chronic kidney disease: Secondary | ICD-10-CM | POA: Diagnosis present

## 2023-10-20 DIAGNOSIS — J9611 Chronic respiratory failure with hypoxia: Secondary | ICD-10-CM | POA: Diagnosis not present

## 2023-10-20 DIAGNOSIS — I5032 Chronic diastolic (congestive) heart failure: Secondary | ICD-10-CM | POA: Diagnosis not present

## 2023-10-20 DIAGNOSIS — I272 Pulmonary hypertension, unspecified: Secondary | ICD-10-CM | POA: Diagnosis present

## 2023-10-20 DIAGNOSIS — I471 Supraventricular tachycardia, unspecified: Secondary | ICD-10-CM | POA: Diagnosis present

## 2023-10-20 DIAGNOSIS — R0609 Other forms of dyspnea: Secondary | ICD-10-CM | POA: Diagnosis not present

## 2023-10-20 DIAGNOSIS — G8929 Other chronic pain: Secondary | ICD-10-CM | POA: Diagnosis present

## 2023-10-20 DIAGNOSIS — Z1152 Encounter for screening for COVID-19: Secondary | ICD-10-CM | POA: Diagnosis not present

## 2023-10-20 DIAGNOSIS — I2510 Atherosclerotic heart disease of native coronary artery without angina pectoris: Secondary | ICD-10-CM | POA: Diagnosis present

## 2023-10-20 DIAGNOSIS — J479 Bronchiectasis, uncomplicated: Secondary | ICD-10-CM | POA: Diagnosis present

## 2023-10-20 DIAGNOSIS — Z85118 Personal history of other malignant neoplasm of bronchus and lung: Secondary | ICD-10-CM | POA: Diagnosis not present

## 2023-10-20 DIAGNOSIS — I428 Other cardiomyopathies: Secondary | ICD-10-CM | POA: Diagnosis present

## 2023-10-20 DIAGNOSIS — J449 Chronic obstructive pulmonary disease, unspecified: Secondary | ICD-10-CM | POA: Diagnosis not present

## 2023-10-20 DIAGNOSIS — Z794 Long term (current) use of insulin: Secondary | ICD-10-CM | POA: Diagnosis not present

## 2023-10-20 DIAGNOSIS — J9621 Acute and chronic respiratory failure with hypoxia: Secondary | ICD-10-CM | POA: Diagnosis not present

## 2023-10-20 DIAGNOSIS — I509 Heart failure, unspecified: Secondary | ICD-10-CM | POA: Diagnosis not present

## 2023-10-20 DIAGNOSIS — J4489 Other specified chronic obstructive pulmonary disease: Secondary | ICD-10-CM | POA: Diagnosis present

## 2023-10-20 DIAGNOSIS — Z7901 Long term (current) use of anticoagulants: Secondary | ICD-10-CM | POA: Diagnosis not present

## 2023-10-20 DIAGNOSIS — A319 Mycobacterial infection, unspecified: Secondary | ICD-10-CM | POA: Diagnosis present

## 2023-10-20 DIAGNOSIS — N179 Acute kidney failure, unspecified: Secondary | ICD-10-CM | POA: Diagnosis not present

## 2023-10-20 DIAGNOSIS — I5023 Acute on chronic systolic (congestive) heart failure: Secondary | ICD-10-CM | POA: Diagnosis present

## 2023-10-20 DIAGNOSIS — I2782 Chronic pulmonary embolism: Secondary | ICD-10-CM | POA: Diagnosis present

## 2023-10-20 DIAGNOSIS — E1165 Type 2 diabetes mellitus with hyperglycemia: Secondary | ICD-10-CM | POA: Diagnosis present

## 2023-10-20 DIAGNOSIS — N1831 Chronic kidney disease, stage 3a: Secondary | ICD-10-CM | POA: Diagnosis present

## 2023-10-20 DIAGNOSIS — Z66 Do not resuscitate: Secondary | ICD-10-CM | POA: Diagnosis present

## 2023-10-20 DIAGNOSIS — E1122 Type 2 diabetes mellitus with diabetic chronic kidney disease: Secondary | ICD-10-CM | POA: Diagnosis present

## 2023-10-20 LAB — CBC WITH DIFFERENTIAL/PLATELET
Abs Immature Granulocytes: 0.1 10*3/uL — ABNORMAL HIGH (ref 0.00–0.07)
Basophils Absolute: 0 10*3/uL (ref 0.0–0.1)
Basophils Relative: 1 %
Eosinophils Absolute: 0.1 10*3/uL (ref 0.0–0.5)
Eosinophils Relative: 2 %
HCT: 47.7 % (ref 39.0–52.0)
Hemoglobin: 15.3 g/dL (ref 13.0–17.0)
Immature Granulocytes: 1 %
Lymphocytes Relative: 19 %
Lymphs Abs: 1.6 10*3/uL (ref 0.7–4.0)
MCH: 29.9 pg (ref 26.0–34.0)
MCHC: 32.1 g/dL (ref 30.0–36.0)
MCV: 93.2 fL (ref 80.0–100.0)
Monocytes Absolute: 0.7 10*3/uL (ref 0.1–1.0)
Monocytes Relative: 8 %
Neutro Abs: 5.7 10*3/uL (ref 1.7–7.7)
Neutrophils Relative %: 69 %
Platelets: 198 10*3/uL (ref 150–400)
RBC: 5.12 MIL/uL (ref 4.22–5.81)
RDW: 13.9 % (ref 11.5–15.5)
WBC: 8.2 10*3/uL (ref 4.0–10.5)
nRBC: 0 % (ref 0.0–0.2)

## 2023-10-20 LAB — BASIC METABOLIC PANEL
Anion gap: 11 (ref 5–15)
BUN: 12 mg/dL (ref 8–23)
CO2: 24 mmol/L (ref 22–32)
Calcium: 9.5 mg/dL (ref 8.9–10.3)
Chloride: 99 mmol/L (ref 98–111)
Creatinine, Ser: 1.19 mg/dL (ref 0.61–1.24)
GFR, Estimated: >60 mL/min (ref >60)
Glucose, Bld: 323 mg/dL — ABNORMAL HIGH (ref 70–99)
Potassium: 4.3 mmol/L (ref 3.5–5.1)
Sodium: 134 mmol/L — ABNORMAL LOW (ref 135–145)

## 2023-10-20 LAB — GLUCOSE, CAPILLARY
Glucose-Capillary: 260 mg/dL — ABNORMAL HIGH (ref 70–99)
Glucose-Capillary: 293 mg/dL — ABNORMAL HIGH (ref 70–99)
Glucose-Capillary: 85 mg/dL (ref 70–99)
Glucose-Capillary: 133 mg/dL — ABNORMAL HIGH (ref 70–99)

## 2023-10-20 MED ORDER — INSULIN ASPART 100 UNIT/ML IJ SOLN: 0.0000 [IU] | Freq: Every day | INTRAMUSCULAR | Status: DC

## 2023-10-20 MED ORDER — OXYCODONE HCL 5 MG PO TABS
5.0000 mg | ORAL_TABLET | Freq: Four times a day (QID) | ORAL | Status: DC | PRN
  Administered 2023-10-20 – 2023-10-21 (×2): 5 mg via ORAL
  Filled 2023-10-20 (×2): qty 1

## 2023-10-20 MED ORDER — INSULIN ASPART 100 UNIT/ML IJ SOLN: 0.0000 [IU] | Freq: Three times a day (TID) | INTRAMUSCULAR | Status: DC

## 2023-10-20 MED ORDER — METHOCARBAMOL 500 MG PO TABS
750.0000 mg | ORAL_TABLET | Freq: Two times a day (BID) | ORAL | Status: DC | PRN
  Administered 2023-10-20: 750 mg via ORAL
  Filled 2023-10-20: qty 2

## 2023-10-20 MED ORDER — FUROSEMIDE 10 MG/ML IJ SOLN
40.0000 mg | Freq: Two times a day (BID) | INTRAMUSCULAR | Status: DC
  Administered 2023-10-20 (×2): 40 mg via INTRAVENOUS
  Filled 2023-10-20 (×3): qty 4

## 2023-10-20 MED ORDER — INSULIN GLARGINE-YFGN 100 UNIT/ML ~~LOC~~ SOLN
40.0000 [IU] | Freq: Every day | SUBCUTANEOUS | Status: DC
  Administered 2023-10-20 – 2023-10-22 (×3): 40 [IU] via SUBCUTANEOUS
  Filled 2023-10-20 (×4): qty 0.4

## 2023-10-20 MED ORDER — INSULIN ASPART 100 UNIT/ML IJ SOLN
6.0000 [IU] | Freq: Three times a day (TID) | INTRAMUSCULAR | Status: DC
  Administered 2023-10-20 – 2023-10-21 (×4): 6 [IU] via SUBCUTANEOUS

## 2023-10-20 MED ORDER — OXYCODONE-ACETAMINOPHEN 5-325 MG PO TABS
1.0000 | ORAL_TABLET | Freq: Four times a day (QID) | ORAL | Status: DC | PRN
  Administered 2023-10-20 – 2023-10-21 (×2): 1 via ORAL
  Filled 2023-10-20 (×2): qty 1

## 2023-10-20 MED ORDER — OXYCODONE-ACETAMINOPHEN 10-325 MG PO TABS: 1.0000 | ORAL_TABLET | Freq: Four times a day (QID) | ORAL | Status: DC | PRN

## 2023-10-20 NOTE — Progress Notes (Incomplete)
TRH ROUNDING  NOTE [name]. 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  Elevated  CBG on admission   Plan  ***        DVT prophylaxis: ***  Status is: Inpatient {Inpatient:23812}      Subjective: ***  Objective + exam Vitals:   10/20/23 0322 10/20/23 0757 10/20/23 1000 10/20/23 1343  BP: 117/76 (!) 125/94  109/71  Pulse: 87 93  80  Resp: 18 12  14   Temp: 98.3 F (36.8 C) 97.6 F (36.4 C)  97.6 F (36.4 C)  TempSrc:  Oral  Oral  SpO2: 100% 100%  100%  Weight:   87.4 kg   Height:       Filed Weights   10/19/23 1113 10/19/23 2315 10/20/23 1000  Weight: 99.3 kg 99.1 kg 87.4 kg    Examination: ***  Data Reviewed: reviewed   CBC    Component Value Date/Time   WBC 8.2 10/20/2023 0840   RBC 5.12 10/20/2023 0840   HGB 15.3 10/20/2023 0840   HCT 47.7 10/20/2023 0840   PLT 198 10/20/2023 0840   MCV 93.2 10/20/2023 0840   MCH 29.9 10/20/2023 0840   MCHC 32.1 10/20/2023 0840   RDW 13.9 10/20/2023 0840   LYMPHSABS 1.6  10/20/2023 0840   MONOABS 0.7 10/20/2023 0840   EOSABS 0.1 10/20/2023 0840   BASOSABS 0.0 10/20/2023 0840      Latest Ref Rng & Units 10/20/2023    9:20 AM 10/19/2023   11:46 AM 10/19/2023    9:58 AM  CMP  Glucose 70 - 99 mg/dL 811  914  782   BUN 8 - 23 mg/dL 12  10  10    Creatinine 0.61 - 1.24 mg/dL 9.56  2.13  0.86   Sodium 135 - 145 mmol/L 134  137  136   Potassium 3.5 - 5.1 mmol/L 4.3  4.3  4.1   Chloride 98 - 111 mmol/L 99  99  98   CO2 22 - 32 mmol/L 24  25  26    Calcium 8.9 - 10.3 mg/dL 9.5  57.8  9.9   Total Protein 6.5 - 8.1 g/dL   8.2   Total Bilirubin 0.0 - 1.2 mg/dL   0.9   Alkaline Phos 38 - 126 U/L   89   AST 15 - 41 U/L   16   ALT 0 - 44 U/L   13     Scheduled Meds:  allopurinol  100 mg Oral Daily   amitriptyline  50 mg Oral BID   arformoterol  15 mcg Nebulization BID   atorvastatin  40 mg Oral QHS   azithromycin  500 mg Oral Once per day on Monday Wednesday Friday   budesonide  0.5 mg Nebulization BID   carvedilol  37.5 mg Oral BID WC   furosemide  40 mg Intravenous BID   gabapentin  100 mg Oral TID   hydrALAZINE  75 mg Oral TID   insulin aspart  0-15 Units Subcutaneous TID WC   insulin aspart  0-5 Units Subcutaneous QHS   insulin glargine-yfgn  30 Units Subcutaneous QHS   pantoprazole  40 mg Oral BID   rivaroxaban  20 mg Oral q morning   spironolactone  25 mg Oral Daily   tamsulosin  0.4 mg Oral Daily   umeclidinium bromide  1 puff Inhalation Daily   Continuous Infusions:  Time  ***  Rhetta Mura, MD  [facility]

## 2023-10-20 NOTE — Progress Notes (Addendum)
TRH ROUNDING  NOTE [name]. 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 HTN BPH Mixed hearing loss follows with ENT  2/17 to ED with SOB over several days heaviness in chest-this is in the setting of thoracic impedance 1 30 through 2/10 suggesting accumulation-compliant on Lasix 40 twice daily worsening shortness of breath and heaviness in chest--more orthopneic over 2 to 3 weeks-usually cannot really walk more than 10-12 steps with walker without feeling short of breath He has chronically been ill and when he was living in [place] he will be admitted for weeks at a time with IV infusions to help with his illnesses He was seen by ED and noted to be paced by EKG was a little abnormal  sodium 136 potassium 4.1 BUN/creatinine 10/.1.2 LFTs normal BNP 58 troponin 82  hemoglobin 15 WBC 9 platelet 200  COVID and negative flu panel negative.  Elevated  CBG on admission   Plan  Multifactorial SOB Treating acute/chronic CHF stage II heart failure [?  Precipitant SVT yesterday on admission)  giving Lasix 40 IV twice daily-I/O- 960 weight inaccurate down from 99-87? --- last OV reported 205 on admission 218  Continues on Coreg 37.5 twice daily, Aldactone 25 daily, hydralazine 75 3 times daily amlodipine discontinued this admission Anticipate discharge home and discussion regarding same  Left lung cancer stage I adeno CA diagnosed 1996 with recurrence and then XRT 10/2022 follows with Dr. 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 Questionable candidate-based on assessment by Dr. Zackery Barefoot, ID at [facility] who saw him 10/2020 Will continue chronic azithromycin 500, 3 times a week for respiratory clearance  CAD with secondary AICD placed 2022 or 2023 Dr. Ladona Ridgel Last cath 10/2018 nonobstructive disease Meds as above  DM TY 2  Hyperglycemic on admission and 300s Farxiga 10 on hold takes NovoLog FlexPen 2540 3 times daily meal, 50 units long-acting Increase current 30 units long-acting to 40 units add mealtime 6 units 3 times daily, cover at bedtime, changed to resistant coverage also Continue gabapentin 100 3 times daily neuropathy, Robaxin 750 for spasm and pain  Anxiety continue Xanax 0.5 twice daily  Discussed with brother at bedside  DVT prophylaxis: Chronic Xarelto  Status is: Inpatient Remains inpatient appropriate because:   Requires net negative balance-might require goals of care discussion given poor functional status depending on how he does over the next several days  Subjective: Awake coherent sitting up in bed no fever no chills no nausea no vomiting ROM is intact He is not really swollen I cannot appreciate a lot of JVD but he feels short  of breath on lying flat-he says he has not been that way previously that seems to be his only symptom   Objective + exam Vitals:   10/20/23 0322 10/20/23 0757 10/20/23 1000 10/20/23 1343  BP: 117/76 (!) 125/94  109/71  Pulse: 87 93  80  Resp: 18 12  14   Temp: 98.3 F (36.8 C) 97.6 F (36.4 C)  97.6 F (36.4 C)  TempSrc:  Oral   Oral  SpO2: 100% 100%  100%  Weight:   87.4 kg   Height:       Filed Weights   10/19/23 1113 10/19/23 2315 10/20/23 1000  Weight: 99.3 kg 99.1 kg 87.4 kg    Examination: EOMI NCAT mild JVD S1-S2 paced rhythm on monitor with some PVCs Chest has distant lung sounds no wheeze rales rhonchi Abdomen soft slightly obese nondistended no right upper quadrant tenderness power 5/5 no focal deficit psych euthymic coherent pleasant  Data Reviewed: reviewed   CBC    Component Value Date/Time   WBC 8.2 10/20/2023 0840   RBC 5.12 10/20/2023 0840   HGB 15.3 10/20/2023 0840   HCT 47.7 10/20/2023 0840   PLT 198 10/20/2023 0840   MCV 93.2 10/20/2023 0840   MCH 29.9 10/20/2023 0840   MCHC 32.1 10/20/2023 0840   RDW 13.9 10/20/2023 0840   LYMPHSABS 1.6 10/20/2023 0840   MONOABS 0.7 10/20/2023 0840   EOSABS 0.1 10/20/2023 0840   BASOSABS 0.0 10/20/2023 0840      Latest Ref Rng & Units 10/20/2023    9:20 AM 10/19/2023   11:46 AM 10/19/2023    9:58 AM  CMP  Glucose 70 - 99 mg/dL 956  213  086   BUN 8 - 23 mg/dL 12  10  10    Creatinine 0.61 - 1.24 mg/dL 5.78  4.69  6.29   Sodium 135 - 145 mmol/L 134  137  136   Potassium 3.5 - 5.1 mmol/L 4.3  4.3  4.1   Chloride 98 - 111 mmol/L 99  99  98   CO2 22 - 32 mmol/L 24  25  26    Calcium 8.9 - 10.3 mg/dL 9.5  52.8  9.9   Total Protein 6.5 - 8.1 g/dL   8.2   Total Bilirubin 0.0 - 1.2 mg/dL   0.9   Alkaline Phos 38 - 126 U/L   89   AST 15 - 41 U/L   16   ALT 0 - 44 U/L   13     Scheduled Meds:  allopurinol  100 mg Oral Daily   amitriptyline  50 mg Oral BID   arformoterol  15 mcg Nebulization BID   atorvastatin  40 mg Oral QHS   azithromycin  500 mg Oral Once per day on Monday Wednesday Friday   budesonide  0.5 mg Nebulization BID   carvedilol  37.5 mg Oral BID WC   furosemide  40 mg Intravenous BID   gabapentin  100 mg Oral TID   hydrALAZINE  75 mg Oral TID   insulin aspart  0-15 Units Subcutaneous TID WC   insulin aspart  0-5 Units  Subcutaneous QHS   insulin glargine-yfgn  30 Units Subcutaneous QHS   pantoprazole  40 mg Oral BID   rivaroxaban  20 mg Oral q morning   spironolactone  25 mg Oral Daily   tamsulosin  0.4 mg Oral Daily   umeclidinium bromide  1 puff Inhalation Daily   Continuous Infusions:  Time  55  Rhetta Mura, MD  [facility]

## 2023-10-20 NOTE — Inpatient Diabetes Management (Signed)
Inpatient Diabetes Program Recommendations  AACE/ADA: New Consensus Statement on Inpatient Glycemic Control (2015)  Target Ranges:  Prepandial:   less than 140 mg/dL      Peak postprandial:   less than 180 mg/dL (1-2 hours)      Critically ill patients:  140 - 180 mg/dL   Lab Results  Component Value Date   GLUCAP 293 (H) 10/20/2023   HGBA1C 10.9 (H) 10/19/2023    Review of Glycemic Control  Latest Reference Range & Units 10/19/23 13:52 10/19/23 20:18 10/20/23 07:44 10/20/23 11:01  Glucose-Capillary 70 - 99 mg/dL 161 (H) 096 (H) 045 (H) 293 (H)   Diabetes history: DM 2 Outpatient Diabetes medications:  Basal insulin 50 units qhs (pt does not remember the name), Novolog 25 units tid before meals,  Farxiga 10 mg Daily Current orders for Inpatient glycemic control:  Semglee 30 units qhs, Novolog 0-15 units tid + hs  A1c 10.9% on 2/17  Spoke with pt over the phone regarding A1c level of 10.9% and glucose control at home. Pt reports Seeing Dr. Catalina Pizza as PCP every 3 months. Pt is compliant with medications and glucose checks and has a CGM sensor. PT reports changes were made at last appt to his medications. Discussed currently A1c and reviewed glucose and A1c goals. Suggested Endocrinology referral. Pt is familiar with Ronny Bacon, NP from the Endocrinology office in [place] anyway.PT has all needed supplies. Told pt if trends were not controlled before next appointment to call into the office for adjustments and for Endocrinology referral.   Will follow pt while here.  Thanks,  Christena Deem RN, MSN, BC-ADM Inpatient Diabetes Coordinator Team Pager [phone] (8a-5p)

## 2023-10-20 NOTE — Progress Notes (Signed)
Rounding Note    Patient Name: Roger Lowe. Date of Encounter: 10/20/2023  [facility] Cardiologist: Dina Rich, MD   Subjective   SOB and chest pain improving but not resolved.   Inpatient Medications    Scheduled Meds:  allopurinol  100 mg Oral Daily   amitriptyline  50 mg Oral BID   arformoterol  15 mcg Nebulization BID   atorvastatin  40 mg Oral QHS   azithromycin  500 mg Oral Once per day on Monday Wednesday Friday   budesonide  0.5 mg Nebulization BID   carvedilol  37.5 mg Oral BID WC   gabapentin  100 mg Oral TID   hydrALAZINE  75 mg Oral TID   insulin aspart  0-15 Units Subcutaneous TID WC   insulin aspart  0-5 Units Subcutaneous QHS   insulin glargine-yfgn  30 Units Subcutaneous QHS   pantoprazole  40 mg Oral BID   rivaroxaban  20 mg Oral q morning   spironolactone  25 mg Oral Daily   tamsulosin  0.4 mg Oral Daily   umeclidinium bromide  1 puff Inhalation Daily   Continuous Infusions:  PRN Meds: albuterol, ALPRAZolam, ipratropium-albuterol   Vital Signs    Vitals:   10/19/23 2019 10/19/23 2315 10/20/23 0322 10/20/23 0757  BP: (!) 125/57 135/64 117/76 (!) 125/94  Pulse: 73 95 87 93  Resp: 18 18 18 12   Temp: 98.3 F (36.8 C) 98.2 F (36.8 C) 98.3 F (36.8 C) 97.6 F (36.4 C)  TempSrc:  Oral  Oral  SpO2: 100% 100% 100% 100%  Weight:  99.1 kg    Height:        Intake/Output Summary (Last 24 hours) at 10/20/2023 0836 Last data filed at 10/20/2023 0500 Gross per 24 hour  Intake 240 ml  Output 1325 ml  Net -1085 ml      10/19/2023   11:15 PM 10/19/2023   11:13 AM 09/09/2023    9:53 AM  Last 3 Weights  Weight (lbs) 218 lb 7.6 oz 218 lb 14.7 oz 218 lb 9.6 oz  Weight (kg) 99.1 kg 99.3 kg 99.156 kg      Telemetry    SR - Personally Reviewed  ECG    N/a - Personally Reviewed  Physical Exam   GEN: No acute distress.   Neck: No JVD Cardiac: RRR, no murmurs, rubs, or gallops.  Respiratory: crackles bilateral bases GI:  Soft, nontender, non-distended  MS: No edema; No deformity. Neuro:  Nonfocal  Psych: Normal affect   Labs    High Sensitivity Troponin:   Recent Labs  Lab 10/19/23 1146 10/19/23 1419  TROPONINIHS 22* 21*     Chemistry Recent Labs  Lab 10/19/23 0958 10/19/23 1146 10/19/23 1153  NA 136 137  --   K 4.1 4.3  --   CL 98 99  --   CO2 26 25  --   GLUCOSE 308* 305*  --   BUN 10 10  --   CREATININE 1.23 1.29*  --   CALCIUM 9.9 10.2  --   MG  --   --  1.9  PROT 8.2*  --   --   ALBUMIN 3.5  --   --   AST 16  --   --   ALT 13  --   --   ALKPHOS 89  --   --   BILITOT 0.9  --   --   GFRNONAA >60 58*  --   ANIONGAP 12 13  --  Lipids No results for input(s): "CHOL", "TRIG", "HDL", "LABVLDL", "LDLCALC", "CHOLHDL" in the last 168 hours.  Hematology Recent Labs  Lab 10/19/23 0958 10/19/23 1146  WBC 9.0 9.4  RBC 5.24 5.28  HGB 15.8 15.8  HCT 49.0 49.6  MCV 93.5 93.9  MCH 30.2 29.9  MCHC 32.2 31.9  RDW 14.1 14.1  PLT 201 202   Thyroid No results for input(s): "TSH", "FREET4" in the last 168 hours.  BNP Recent Labs  Lab 10/19/23 1146  BNP 58.0    DDimer No results for input(s): "DDIMER" in the last 168 hours.   Radiology    DG Chest 2 View Result Date: 10/19/2023 CLINICAL DATA:  Chest pain EXAM: CHEST - 2 VIEW COMPARISON:  07/29/2023. FINDINGS: Bilateral lung fields are clear. Bilateral costophrenic angles are clear. Note is made of elevated right hemidiaphragm. Normal cardio-mediastinal silhouette. There is a left sided 3-lead pacemaker. No acute osseous abnormalities. The soft tissues are within normal limits. IMPRESSION: No active cardiopulmonary disease. Electronically Signed   By: Jules Schick M.D.   On: 10/19/2023 13:36    Cardiac Studies     Patient Profile     Roger Paver. is a 78 y.o. male with a hx of HFimpEF, COPD on home O2, chronic pulmonary MAC,  who is being seen 10/19/2023 for the evaluation of SOB and chest pain at the request of Dr Durwin Nora.    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 Reports recent compliance   - atypical chest pressure constant lasting for days -suspect related to SVT and volume overload - no evidence of ACS by EKG or enzymes. Follow symptoms with management of arrhythmia and volume.  - no plans for ischemic testing at this time.  Symptoms improving with rate control and diuresis   2. SOB - BNP not elevated, CXR clear. Corevue from device has suggested some fluid accumulation. By exam - long history of lung disease with COPD, lung CA on cancer. Defer any lung workup per primary team - suspected related to SVT leading to some fluid overload.    3. 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 - 10/2018 cath: nonobstructive CAD. Mean PA 33, PCWP 31, CI 2.6  -10/2019 echo LVEF 30-35%, low normal RV function - s/p BiV AICD 10/2019  - 08/2020 echo: LVEF 55-60%   BNP 58, CXR clear. Corevue check from home today suggesting fluid accumulation.  - suspect SVT leading to some volume overload.  - got IV lasix 40mg  x 2 yesterday. I/Os incomplete, roughly negative 1L. Labs are pending today - likely some ongoing fluid overload, pending labs will redose IV lasix.  - f/u repeat echo     4. Wide complex tachycardia - this is not VT. This is SVT with his paced ventricular complexes. Can see interruptions of rhythm with sinus beats with paced ventricular beats, follow by recurrent runs of SVT up to 120s to 130s.  - dose IV lopressor 5mg  in ER, can redose as needed -  increased his home coreg to 37.5mg  bid.  - morphology of paced QRS different morphology from prior BiV tracings, has RBBB morphology. Perhaps related to SVT, follow with control of rhythm, way need device check. Repeat EKG  this AM   -  SVT resolved, continue higher dose beta blocker.      5. History of recurrent DVT/PE - most recent event 05/2023 in setting of xarelto noncompliance - reports strict xarelto compliance.   For questions or updates, please contact [facility] Please consult [email or web address] for contact info under        Signed, Dina Rich, MD  10/20/2023, 8:36 AM

## 2023-10-20 NOTE — Progress Notes (Signed)
   10/20/23 1000  ReDS Vest / Clip  BMI (Calculated) 26.13  Station Marker D  Ruler Value 34  ReDS Value Range < 36  ReDS Actual Value 27

## 2023-10-20 NOTE — Progress Notes (Signed)
   10/20/23 0845  TOC Brief Assessment  Insurance and Status Reviewed  Patient has primary care physician Yes  Home environment has been reviewed from home  Prior level of function: independent  Prior/Current Home Services No current home services  Social Drivers of Health Review SDOH reviewed no interventions necessary  Readmission risk has been reviewed Yes  Transition of care needs no transition of care needs at this time     Transition of Care Department [facility]) has reviewed patient and no TOC needs have been identified at this time. We will continue to monitor patient advancement through interdisciplinary progression rounds. If new patient transition needs arise, please place a TOC consult.

## 2023-10-21 ENCOUNTER — Inpatient Hospital Stay (HOSPITAL_COMMUNITY): Payer: Medicare HMO

## 2023-10-21 DIAGNOSIS — I471 Supraventricular tachycardia, unspecified: Secondary | ICD-10-CM | POA: Diagnosis not present

## 2023-10-21 DIAGNOSIS — I509 Heart failure, unspecified: Secondary | ICD-10-CM

## 2023-10-21 DIAGNOSIS — R0609 Other forms of dyspnea: Secondary | ICD-10-CM

## 2023-10-21 LAB — BASIC METABOLIC PANEL
Anion gap: 9 (ref 5–15)
BUN: 17 mg/dL (ref 8–23)
CO2: 28 mmol/L (ref 22–32)
Calcium: 9.1 mg/dL (ref 8.9–10.3)
Chloride: 98 mmol/L (ref 98–111)
Creatinine, Ser: 1.56 mg/dL — ABNORMAL HIGH (ref 0.61–1.24)
GFR, Estimated: 46 mL/min — ABNORMAL LOW (ref >60)
Glucose, Bld: 97 mg/dL (ref 70–99)
Potassium: 3.6 mmol/L (ref 3.5–5.1)
Sodium: 135 mmol/L (ref 135–145)

## 2023-10-21 LAB — ECHOCARDIOGRAM COMPLETE
AR max vel: 3.03 cm2
AV Area VTI: 2.96 cm2
AV Area mean vel: 2.95 cm2
AV Mean grad: 2 mm[Hg]
AV Peak grad: 3.9 mm[Hg]
Ao pk vel: 0.98 m/s
Area-P 1/2: 6.83 cm2
Height: 72 in
S' Lateral: 2.4 cm
Weight: 3082.91 [oz_av]

## 2023-10-21 LAB — CBC WITH DIFFERENTIAL/PLATELET
Abs Immature Granulocytes: 0.09 10*3/uL — ABNORMAL HIGH (ref 0.00–0.07)
Basophils Absolute: 0 10*3/uL (ref 0.0–0.1)
Basophils Relative: 0 %
Eosinophils Absolute: 0.2 10*3/uL (ref 0.0–0.5)
Eosinophils Relative: 2 %
HCT: 46.5 % (ref 39.0–52.0)
Hemoglobin: 14.6 g/dL (ref 13.0–17.0)
Immature Granulocytes: 1 %
Lymphocytes Relative: 25 %
Lymphs Abs: 2.2 10*3/uL (ref 0.7–4.0)
MCH: 29.7 pg (ref 26.0–34.0)
MCHC: 31.4 g/dL (ref 30.0–36.0)
MCV: 94.7 fL (ref 80.0–100.0)
Monocytes Absolute: 0.7 10*3/uL (ref 0.1–1.0)
Monocytes Relative: 7 %
Neutro Abs: 5.9 10*3/uL (ref 1.7–7.7)
Neutrophils Relative %: 65 %
Platelets: 214 10*3/uL (ref 150–400)
RBC: 4.91 MIL/uL (ref 4.22–5.81)
RDW: 14.1 % (ref 11.5–15.5)
WBC: 9.1 10*3/uL (ref 4.0–10.5)
nRBC: 0 % (ref 0.0–0.2)

## 2023-10-21 LAB — GLUCOSE, CAPILLARY
Glucose-Capillary: 113 mg/dL — ABNORMAL HIGH (ref 70–99)
Glucose-Capillary: 172 mg/dL — ABNORMAL HIGH (ref 70–99)
Glucose-Capillary: 97 mg/dL (ref 70–99)
Glucose-Capillary: 104 mg/dL — ABNORMAL HIGH (ref 70–99)

## 2023-10-21 MED ORDER — POTASSIUM CHLORIDE CRYS ER 20 MEQ PO TBCR
40.0000 meq | EXTENDED_RELEASE_TABLET | Freq: Once | ORAL | Status: AC
  Administered 2023-10-21: 40 meq via ORAL
  Filled 2023-10-21: qty 2

## 2023-10-21 NOTE — Progress Notes (Signed)
*  PRELIMINARY RESULTS* Echocardiogram 2D Echocardiogram has been performed.  Earlie Server Trenise Turay 10/21/2023, 11:08 AM

## 2023-10-21 NOTE — Evaluation (Signed)
Physical Therapy Evaluation Patient Details Name: Roger Lowe. MRN: [id number] DOB: 1945/09/09 Today's Date: 10/21/2023  History of Present Illness  Roger Lowe. is a 78 y.o. male with medical history significant for chronic respiratory failure on 3 L, pulmonary embolism and DVT, hypertension, gout, coronary artery disease, systolic CHF AICD, COPD, diabetes mellitus.  Patient presented to ED with complaints of difficulty breathing over the past 2 weeks.  Reports lower extremity swelling also.  Denies abdominal bloating.  Reports increasing weight gain from baseline weight is about 205 to about 214lbs at home.  Reports generalized chest pressure over the past few days.  He reports compliance with his Lasix daily and Xarelto.   Clinical Impression  On therapist arrival, patient is sleeping but rouses easily with verbal greeting.  He is pleasant and agreeable to therapist assessment.  He reports 8/10 pain in his shoulders and hips but states this is his normal chronic pain.  He is on 3 L of O2 and states at home his baseline is 2-4 Liters.  Patient performs supine to sit with HOB slightly elevated with modified independence; taking extra time due to general weakness.   Patient needs min A for sit to stand for initial boost up to standing and then needs min A to maintain balance.  He is able to take a few steps over to the chair and sit down in a controlled manner with continued min A and cues.  patient left in chair with call button in reach, chair alarm set, family member at bedside and nursing notified of mobility status. Patient will benefit from continued skilled therapy services during the remainder of his hospital stay and at the next recommended venue of care to address deficits and promote return to optimal function.           If plan is discharge home, recommend the following: A little help with walking and/or transfers;A little help with bathing/dressing/bathroom;Help with stairs or ramp for  entrance;Assistance with cooking/housework   Can travel by private vehicle        Equipment Recommendations None recommended by PT  Recommendations for Other Services       Functional Status Assessment Patient has had a recent decline in their functional status and demonstrates the ability to make significant improvements in function in a reasonable and predictable amount of time.     Precautions / Restrictions Precautions Precautions: Fall Recall of Precautions/Restrictions: Intact Precaution/Restrictions Comments: uses RW at home Restrictions Weight Bearing Restrictions Per Provider Order: No      Mobility  Bed Mobility Overal bed mobility: Modified Independent             General bed mobility comments: supine to sit with HOB slightly elevated    Transfers Overall transfer level: Needs assistance Equipment used: Rolling walker (2 wheels) Transfers: Sit to/from Stand Sit to Stand: Min assist           General transfer comment: min A for initial boost up to standing    Ambulation/Gait Ambulation/Gait assistance: Min assist Gait Distance (Feet): 2 Feet Assistive device: Rolling walker (2 wheels) Gait Pattern/deviations: Decreased step length - left, Decreased step length - right Gait velocity: decreased     General Gait Details: patient needs min A to maintain balance  Stairs            Wheelchair Mobility     Tilt Bed    Modified Rankin (Stroke Patients Only)       Balance Overall balance assessment:  Needs assistance Sitting-balance support: Bilateral upper extremity supported, Feet supported Sitting balance-Leahy Scale: Good Sitting balance - Comments: good sitting balance on edge of the bed   Standing balance support: Bilateral upper extremity supported, During functional activity, Reliant on assistive device for balance Standing balance-Leahy Scale: Fair Standing balance comment: fair standing balance with RW; needs min A to  maintain balance                             Pertinent Vitals/Pain Pain Assessment Pain Assessment: 0-10 Pain Score: 8  Pain Location: bilateral shoulders and hips Pain Intervention(s): Limited activity within patient's tolerance, Monitored during session, Repositioned    Home Living Family/patient expects to be discharged to:: Private residence Living Arrangements: Other relatives;Other (Comment) (brother) Available Help at Discharge: Family Type of Home: Apartment Home Access: Stairs to enter;Other (comment) (uses elevator to access apartment)   Entrance Stairs-Number of Steps: flight of steps up to apartment but uses elevator   Home Layout: One level Home Equipment: Agricultural consultant (2 wheels);Rollator (4 wheels);Electric scooter;Wheelchair - power;Other (comment) (oxygen) Additional Comments: RW in home and sometimes WC; scooter and WC for longer distances outside of home    Prior Function Prior Level of Function : Independent/Modified Independent             Mobility Comments: RW, electric scooter and power WC. ADLs Comments: states he can dress and bathe independently but takes extra time     Extremity/Trunk Assessment   Upper Extremity Assessment Upper Extremity Assessment: Generalized weakness    Lower Extremity Assessment Lower Extremity Assessment: Generalized weakness    Cervical / Trunk Assessment Cervical / Trunk Assessment: Normal  Communication   Communication Communication: No apparent difficulties Factors Affecting Communication: Hearing impaired    Cognition Arousal: Alert Behavior During Therapy: WFL for tasks assessed/performed   PT - Cognitive impairments: No apparent impairments                       PT - Cognition Comments: pleasant and cooperative Following commands: Intact       Cueing       General Comments      Exercises     Assessment/Plan    PT Assessment Patient needs continued PT services  PT  Problem List Decreased strength;Decreased activity tolerance;Decreased balance;Decreased mobility;Pain       PT Treatment Interventions Gait training;Functional mobility training;Therapeutic activities;Therapeutic exercise;Balance training;Patient/family education    PT Goals (Current goals can be found in the Care Plan section)  Acute Rehab PT Goals Patient Stated Goal: return home PT Goal Formulation: With patient Time For Goal Achievement: 11/04/23 Potential to Achieve Goals: Good    Frequency Min 2X/week     Co-evaluation               AM-PAC PT "6 Clicks" Mobility  Outcome Measure Help needed turning from your back to your side while in a flat bed without using bedrails?: None Help needed moving from lying on your back to sitting on the side of a flat bed without using bedrails?: A Little Help needed moving to and from a bed to a chair (including a wheelchair)?: A Little Help needed standing up from a chair using your arms (e.g., wheelchair or bedside chair)?: A Little Help needed to walk in hospital room?: A Little Help needed climbing 3-5 steps with a railing? : A Lot 6 Click Score: 18    End of  Session Equipment Utilized During Treatment: Oxygen Activity Tolerance: Patient tolerated treatment well Patient left: in chair;with family/visitor present;with call bell/phone within reach;with chair alarm set Nurse Communication: Mobility status PT Visit Diagnosis: Unsteadiness on feet (R26.81);Other abnormalities of gait and mobility (R26.89);Muscle weakness (generalized) (M62.81)    Time: 1345-1405 PT Time Calculation (min) (ACUTE ONLY): 20 min   Charges:   PT Evaluation $PT Eval Low Complexity: 1 Low   PT General Charges $$ ACUTE PT VISIT: 1 Visit         2:24 PM, 10/21/23 Kay Ricciuti Small Alethea Terhaar MPT [place] [facility] [place] [phone] Ph:[phone]

## 2023-10-21 NOTE — Plan of Care (Signed)
  Problem: Acute Rehab PT Goals(only PT should resolve) Goal: Pt Will Go Supine/Side To Sit Outcome: Progressing Flowsheets (Taken 10/21/2023 1424) Pt will go Supine/Side to Sit: with modified independence Goal: Patient Will Transfer Sit To/From Stand Outcome: Progressing Flowsheets (Taken 10/21/2023 1424) Patient will transfer sit to/from stand: with contact guard assist Goal: Pt Will Transfer Bed To Chair/Chair To Bed Outcome: Progressing Flowsheets (Taken 10/21/2023 1424) Pt will Transfer Bed to Chair/Chair to Bed: with contact guard assist Goal: Pt Will Ambulate Outcome: Progressing Flowsheets (Taken 10/21/2023 1424) Pt will Ambulate:  50 feet  with contact guard assist  with rolling walker

## 2023-10-21 NOTE — Progress Notes (Signed)
Rounding Note    Patient Name: Roger Lowe. Date of Encounter: 10/21/2023  [facility] Cardiologist: Dina Rich, MD   Subjective   SOB has resolved  Inpatient Medications    Scheduled Meds:  allopurinol  100 mg Oral Daily   amitriptyline  50 mg Oral BID   arformoterol  15 mcg Nebulization BID   atorvastatin  40 mg Oral QHS   azithromycin  500 mg Oral Once per day on Monday Wednesday Friday   budesonide  0.5 mg Nebulization BID   carvedilol  37.5 mg Oral BID WC   furosemide  40 mg Intravenous BID   gabapentin  100 mg Oral TID   hydrALAZINE  75 mg Oral TID   insulin aspart  0-15 Units Subcutaneous TID WC   insulin aspart  0-20 Units Subcutaneous TID WC   insulin aspart  0-5 Units Subcutaneous QHS   insulin aspart  6 Units Subcutaneous TID WC   insulin glargine-yfgn  30 Units Subcutaneous QHS   insulin glargine-yfgn  40 Units Subcutaneous Daily   pantoprazole  40 mg Oral BID   rivaroxaban  20 mg Oral q morning   spironolactone  25 mg Oral Daily   tamsulosin  0.4 mg Oral Daily   umeclidinium bromide  1 puff Inhalation Daily   Continuous Infusions:  PRN Meds: albuterol, ALPRAZolam, ipratropium-albuterol, methocarbamol, oxyCODONE-acetaminophen **AND** oxyCODONE   Vital Signs    Vitals:   10/20/23 2013 10/20/23 2145 10/21/23 0443 10/21/23 0721  BP: 106/66  106/71   Pulse: 80  78   Resp: 18  19   Temp: 98 F (36.7 C)  98.4 F (36.9 C)   TempSrc:   Oral   SpO2: 100% 96% 99% 96%  Weight:      Height:        Intake/Output Summary (Last 24 hours) at 10/21/2023 0749 Last data filed at 10/21/2023 0557 Gross per 24 hour  Intake 240 ml  Output 400 ml  Net -160 ml      10/20/2023   10:00 AM 10/19/2023   11:15 PM 10/19/2023   11:13 AM  Last 3 Weights  Weight (lbs) 192 lb 10.9 oz 218 lb 7.6 oz 218 lb 14.7 oz  Weight (kg) 87.4 kg 99.1 kg 99.3 kg      Telemetry    NSR - Personally Reviewed  ECG    N/a - Personally Reviewed  Physical Exam    GEN: No acute distress.   Neck: No JVD Cardiac: RRR, no murmurs, rubs, or gallops.  Respiratory: Clear to auscultation bilaterally. GI: Soft, nontender, non-distended  MS: No edema; No deformity. Neuro:  Nonfocal  Psych: Normal affect   Labs    High Sensitivity Troponin:   Recent Labs  Lab 10/19/23 1146 10/19/23 1419  TROPONINIHS 22* 21*     Chemistry Recent Labs  Lab 10/19/23 0958 10/19/23 1146 10/19/23 1153 10/20/23 0920 10/21/23 0356  NA 136 137  --  134* 135  K 4.1 4.3  --  4.3 3.6  CL 98 99  --  99 98  CO2 26 25  --  24 28  GLUCOSE 308* 305*  --  323* 97  BUN 10 10  --  12 17  CREATININE 1.23 1.29*  --  1.19 1.56*  CALCIUM 9.9 10.2  --  9.5 9.1  MG  --   --  1.9  --   --   PROT 8.2*  --   --   --   --  ALBUMIN 3.5  --   --   --   --   AST 16  --   --   --   --   ALT 13  --   --   --   --   ALKPHOS 89  --   --   --   --   BILITOT 0.9  --   --   --   --   GFRNONAA >60 58*  --  >60 46*  ANIONGAP 12 13  --  11 9    Lipids No results for input(s): "CHOL", "TRIG", "HDL", "LABVLDL", "LDLCALC", "CHOLHDL" in the last 168 hours.  Hematology Recent Labs  Lab 10/19/23 1146 10/20/23 0840 10/21/23 0356  WBC 9.4 8.2 9.1  RBC 5.28 5.12 4.91  HGB 15.8 15.3 14.6  HCT 49.6 47.7 46.5  MCV 93.9 93.2 94.7  MCH 29.9 29.9 29.7  MCHC 31.9 32.1 31.4  RDW 14.1 13.9 14.1  PLT 202 198 214   Thyroid No results for input(s): "TSH", "FREET4" in the last 168 hours.  BNP Recent Labs  Lab 10/19/23 1146  BNP 58.0    DDimer No results for input(s): "DDIMER" in the last 168 hours.   Radiology    DG Chest 2 View Result Date: 10/19/2023 CLINICAL DATA:  Chest pain EXAM: CHEST - 2 VIEW COMPARISON:  07/29/2023. FINDINGS: Bilateral lung fields are clear. Bilateral costophrenic angles are clear. Note is made of elevated right hemidiaphragm. Normal cardio-mediastinal silhouette. There is a left sided 3-lead pacemaker. No acute osseous abnormalities. The soft tissues are within  normal limits. IMPRESSION: No active cardiopulmonary disease. Electronically Signed   By: Jules Schick M.D.   On: 10/19/2023 13:36    Cardiac Studies     Patient Profile     Roger Hallum. is a 78 y.o. male with a hx of HFimpEF, COPD on home O2, chronic pulmonary MAC, who is being seen 10/19/2023 for the evaluation of SOB and chest pain at the request of Dr Durwin Nora.   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 Reports recent compliance   - atypical chest pressure constant lasting for days -suspect related to SVT and volume overload - no evidence of ACS by EKG or enzymes. Follow symptoms with management of arrhythmia and volume.  - no plans for ischemic testing at this time.  Symptoms improving with rate control and diuresis   2. SOB - BNP not elevated, CXR clear. Corevue from device has suggested some fluid accumulation. By exam - long history of lung disease with COPD, lung CA on cancer. Defer any lung workup per primary team - suspected related to SVT leading to some fluid overload.    3. 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 - 10/2018 cath: nonobstructive CAD. Mean PA 33, PCWP 31, CI 2.6  -10/2019 echo LVEF 30-35%, low normal RV function - s/p BiV AICD 10/2019  - 08/2020 echo: LVEF 55-60% - repeat echo pending   BNP 58, CXR clear. Corevue check from home today suggesting fluid accumulation.  - suspect SVT leading to some volume overload.  - got IV lasix 40mg  x 2 . I/Os incomplete. Weights appear inaccurate. Reds vest yesterday was 27%. Significant uptrend in Cr, hold diuretic today. Perhaps  restart oral tomorrow, could continue his home diuretic, exacerbation more related to SVT as opposed to insufficent diuretic.       4. Wide complex  tachycardia - this is not VT. This is SVT with his paced ventricular complexes. Can see interruptions of rhythm with sinus beats with paced ventricular beats, follow by recurrent runs of SVT up to 120s to 130s.  - dose IV lopressor 5mg  in ER, can redose as needed -  increased his home coreg to 37.5mg  bid.  - morphology of paced QRS different morphology from prior BiV tracings, has RBBB morphology. Perhaps related to SVT, follow with control of rhythm, way need device check. Repeat EKG this AM    - SVT resolved, continue higher dose beta blocker.      5. History of recurrent DVT/PE - most recent event 05/2023 in setting of xarelto noncompliance - reports strict xarelto compliance.    For questions or updates, please contact [facility] Please consult [email or web address] for contact info under        Signed, Dina Rich, MD  10/21/2023, 7:49 AM

## 2023-10-21 NOTE — Progress Notes (Signed)
Patient sat up in the chair and has walked a short distance today. In good spirits. Meds taken without difficulty.

## 2023-10-21 NOTE — Progress Notes (Signed)
   10/21/23 1100  ReDS Vest / Clip  Station Marker D  Ruler Value 34  ReDS Value Range < 36  ReDS Actual Value 22

## 2023-10-21 NOTE — Progress Notes (Signed)
  Progress Note   Patient: Roger Lowe. NWG:[id number] DOB: 02/03/1946 DOA: 10/19/2023     1 DOS: the patient was seen and examined on 10/21/2023   Brief hospital course:  Patient 77 year old with history of HFrEF, COPD on 2-3 L, chronic pulmonary MAC, presenting with worse sitting shortness of breath.  Assessment and Plan:  Acute on chronic hypoxic respiratory failure -[place] multifactorial etiology given patient's underlying COPD, pulmonary MAC, HFrEF.  No fever.  No cough to suggest acute bacterial/viral pneumonia.  Mildly volume overloaded per Corevue device.  Cardiology following closely.  Patient's dyspnea appears to be resolved this morning.  Acute exacerbation of chronic chronic HFrEF -LV EF as low as 20-25%, normalized after BiV pacemaker placement.  Reports dyspnea and chest heaviness over the past 2 weeks.  Not hypoxic.  CorVue thoracic impedance -suggest fluid accumulation.  However BNP unremarkable at 58.  Cardiology following closely.  Suggesting possible exacerbation secondary to SVT.  Showing improvement in rate control.  Responded well to IV Lasix 40 mg x 2 doses.  Supraventricular tachycardia - Initially appearing as a wide-complex tachycardia, likely SVT with aberrancy or paced ventricular complexes.  Appears to be showing improvement after increase in p.o. metoprolol to 37.5 mg twice daily.  Contributing to patient's volume overload status.  Acute kidney injury - BUN showing mild elevation, likely exacerbated by diuresis.  Continue to monitor urine output and recheck kidney function in AM.  Uncontrolled diabetes mellitus type 2 -A1c 10.9 suggesting very poor control prior to admission.  Currently on twice daily glargine with insulin sliding scale.  Showing improvement while inpatient.  Will continue to monitor closely.  History of pulmonary embolism  -Xarelto on board  Benign prostatic hyperplasia -Continue Flomax  Essential hypertension -Resume carvedilol,  tamsulosin, spironolactone.  Holding Norvasc for now  Chronic bronchiectasis/chronic pulmonary MAC -Continue chronic azithromycin per patient for about ~12 years now.  Gout Resume allopurinol  Stage 3 severe COPD Not appear to be acutely exacerbated.  Resume home medication regimen, nebulizers on board.  Physical debilitation and muscle weakness - Patient's still continues to be a bit weak.  Will order PT eval.        Subjective: Patient resting comfortably this morning.  States he feels improved from yesterday.  Improved shortness of breath, denies fever, chills, chest pain, nausea, vomiting, abdominal pain.  Still feels weak overall.  Physical Exam: Vitals:   10/20/23 2013 10/20/23 2145 10/21/23 0443 10/21/23 0721  BP: 106/66  106/71   Pulse: 80  78   Resp: 18  19   Temp: 98 F (36.7 C)  98.4 F (36.9 C)   TempSrc:   Oral   SpO2: 100% 96% 99% 96%  Weight:      Height:       GENERAL:  Alert, pleasant, no acute distress  HEENT:  EOMI, nasal cannula CARDIOVASCULAR:  RRR, no murmurs appreciated RESPIRATORY: Poor air movement laterally, no wheezing appreciated GASTROINTESTINAL:  Soft, nontender, nondistended EXTREMITIES:  No LE edema bilaterally NEURO:  No new focal deficits appreciated SKIN:  No rashes noted PSYCH:  Appropriate mood and affect   Data Reviewed:  There are no new results to review at this time.  Family Communication: None in room  Disposition: Status is: Inpatient Remains inpatient appropriate because: HFrEF exacerbation  Planned Discharge Destination: Skilled nursing facility    Time spent: 36 minutes  Author: Deanna Artis, DO 10/21/2023 1:24 PM  For on call review [email or web address].

## 2023-10-22 DIAGNOSIS — J9611 Chronic respiratory failure with hypoxia: Secondary | ICD-10-CM | POA: Diagnosis not present

## 2023-10-22 DIAGNOSIS — I471 Supraventricular tachycardia, unspecified: Secondary | ICD-10-CM | POA: Diagnosis not present

## 2023-10-22 DIAGNOSIS — J449 Chronic obstructive pulmonary disease, unspecified: Secondary | ICD-10-CM | POA: Diagnosis not present

## 2023-10-22 DIAGNOSIS — I509 Heart failure, unspecified: Secondary | ICD-10-CM | POA: Diagnosis not present

## 2023-10-22 LAB — CBC WITH DIFFERENTIAL/PLATELET
Abs Immature Granulocytes: 0.09 K/uL — ABNORMAL HIGH (ref 0.00–0.07)
Basophils Absolute: 0 K/uL (ref 0.0–0.1)
Basophils Relative: 0 %
Eosinophils Absolute: 0.1 K/uL (ref 0.0–0.5)
Eosinophils Relative: 1 %
HCT: 44.9 % (ref 39.0–52.0)
Hemoglobin: 14.4 g/dL (ref 13.0–17.0)
Immature Granulocytes: 1 %
Lymphocytes Relative: 21 %
Lymphs Abs: 1.8 K/uL (ref 0.7–4.0)
MCH: 29.9 pg (ref 26.0–34.0)
MCHC: 32.1 g/dL (ref 30.0–36.0)
MCV: 93.2 fL (ref 80.0–100.0)
Monocytes Absolute: 0.7 K/uL (ref 0.1–1.0)
Monocytes Relative: 8 %
Neutro Abs: 6 K/uL (ref 1.7–7.7)
Neutrophils Relative %: 69 %
Platelets: 219 K/uL (ref 150–400)
RBC: 4.82 MIL/uL (ref 4.22–5.81)
RDW: 13.7 % (ref 11.5–15.5)
WBC: 8.8 K/uL (ref 4.0–10.5)
nRBC: 0 % (ref 0.0–0.2)

## 2023-10-22 LAB — BASIC METABOLIC PANEL WITH GFR
Anion gap: 9 (ref 5–15)
BUN: 18 mg/dL (ref 8–23)
CO2: 25 mmol/L (ref 22–32)
Calcium: 9.3 mg/dL (ref 8.9–10.3)
Chloride: 102 mmol/L (ref 98–111)
Creatinine, Ser: 1.24 mg/dL (ref 0.61–1.24)
GFR, Estimated: >60 mL/min
Glucose, Bld: 53 mg/dL — ABNORMAL LOW (ref 70–99)
Potassium: 4.2 mmol/L (ref 3.5–5.1)
Sodium: 136 mmol/L (ref 135–145)

## 2023-10-22 LAB — GLUCOSE, CAPILLARY
Glucose-Capillary: 121 mg/dL — ABNORMAL HIGH (ref 70–99)
Glucose-Capillary: 244 mg/dL — ABNORMAL HIGH (ref 70–99)

## 2023-10-22 MED ORDER — SPIRONOLACTONE 25 MG PO TABS: 25.0000 mg | ORAL_TABLET | Freq: Every day | ORAL | 0 refills | Status: AC

## 2023-10-22 MED ORDER — FUROSEMIDE 40 MG PO TABS
40.0000 mg | ORAL_TABLET | Freq: Every day | ORAL | Status: DC
  Administered 2023-10-22: 40 mg via ORAL
  Filled 2023-10-22: qty 1

## 2023-10-22 MED ORDER — LEVEMIR FLEXTOUCH 100 UNIT/ML ~~LOC~~ SOPN: 30.0000 [IU] | PEN_INJECTOR | Freq: Every day | SUBCUTANEOUS | 0 refills | Status: DC

## 2023-10-22 MED ORDER — CARVEDILOL 12.5 MG PO TABS: 37.5000 mg | ORAL_TABLET | Freq: Two times a day (BID) | ORAL | 0 refills | Status: DC

## 2023-10-22 MED ORDER — INSULIN GLARGINE-YFGN 100 UNIT/ML ~~LOC~~ SOLN
45.0000 [IU] | Freq: Every day | SUBCUTANEOUS | Status: DC
  Filled 2023-10-22: qty 0.45

## 2023-10-22 MED ORDER — INSULIN GLARGINE-YFGN 100 UNIT/ML ~~LOC~~ SOLN: 30.0000 [IU] | Freq: Two times a day (BID) | SUBCUTANEOUS | 11 refills | Status: DC

## 2023-10-22 NOTE — Progress Notes (Signed)
Rounding Note    Patient Name: Roger Lowe. Date of Encounter: 10/22/2023  [facility] Cardiologist: Dina Rich, MD   Subjective   SOB has resolved. Constant left sided chest pain has been lasting for days  Inpatient Medications    Scheduled Meds:  allopurinol  100 mg Oral Daily   amitriptyline  50 mg Oral BID   arformoterol  15 mcg Nebulization BID   atorvastatin  40 mg Oral QHS   azithromycin  500 mg Oral Once per day on Monday Wednesday Friday   budesonide  0.5 mg Nebulization BID   carvedilol  37.5 mg Oral BID WC   gabapentin  100 mg Oral TID   hydrALAZINE  75 mg Oral TID   insulin aspart  0-15 Units Subcutaneous TID WC   insulin aspart  6 Units Subcutaneous TID WC   insulin glargine-yfgn  30 Units Subcutaneous QHS   insulin glargine-yfgn  40 Units Subcutaneous Daily   pantoprazole  40 mg Oral BID   rivaroxaban  20 mg Oral q morning   spironolactone  25 mg Oral Daily   tamsulosin  0.4 mg Oral Daily   umeclidinium bromide  1 puff Inhalation Daily   Continuous Infusions:  PRN Meds: albuterol, ALPRAZolam, ipratropium-albuterol, methocarbamol, oxyCODONE-acetaminophen **AND** oxyCODONE   Vital Signs    Vitals:   10/21/23 2008 10/21/23 2015 10/22/23 0419 10/22/23 0500  BP: 107/70  111/68   Pulse: 82  84   Resp:      Temp: 98.5 F (36.9 C)  97.7 F (36.5 C)   TempSrc: Oral  Oral   SpO2: 99% 99% 100%   Weight:    88.7 kg  Height:        Intake/Output Summary (Last 24 hours) at 10/22/2023 1027 Last data filed at 10/22/2023 0856 Gross per 24 hour  Intake 780 ml  Output 1900 ml  Net -1120 ml      10/22/2023    5:00 AM 10/20/2023   10:00 AM 10/19/2023   11:15 PM  Last 3 Weights  Weight (lbs) 195 lb 8.8 oz 192 lb 10.9 oz 218 lb 7.6 oz  Weight (kg) 88.7 kg 87.4 kg 99.1 kg      Telemetry    A sensed V paced - Personally Reviewed  ECG    N/a - Personally Reviewed  Physical Exam   GEN: No acute distress.   Neck: No JVD Cardiac:  RRR, no murmurs, rubs, or gallops.  Respiratory: Clear to auscultation bilaterally. GI: Soft, nontender, non-distended  MS: No edema; No deformity. Neuro:  Nonfocal  Psych: Normal affect   Labs    High Sensitivity Troponin:   Recent Labs  Lab 10/19/23 1146 10/19/23 1419  TROPONINIHS 22* 21*     Chemistry Recent Labs  Lab 10/19/23 0958 10/19/23 1146 10/19/23 1153 10/20/23 0920 10/21/23 0356 10/22/23 0330  NA 136   < >  --  134* 135 136  K 4.1   < >  --  4.3 3.6 4.2  CL 98   < >  --  99 98 102  CO2 26   < >  --  24 28 25   GLUCOSE 308*   < >  --  323* 97 53*  BUN 10   < >  --  12 17 18   CREATININE 1.23   < >  --  1.19 1.56* 1.24  CALCIUM 9.9   < >  --  9.5 9.1 9.3  MG  --   --  1.9  --   --   --   PROT 8.2*  --   --   --   --   --   ALBUMIN 3.5  --   --   --   --   --   AST 16  --   --   --   --   --   ALT 13  --   --   --   --   --   ALKPHOS 89  --   --   --   --   --   BILITOT 0.9  --   --   --   --   --   GFRNONAA >60   < >  --  >60 46* >60  ANIONGAP 12   < >  --  11 9 9    < > = values in this interval not displayed.    Lipids No results for input(s): "CHOL", "TRIG", "HDL", "LABVLDL", "LDLCALC", "CHOLHDL" in the last 168 hours.  Hematology Recent Labs  Lab 10/20/23 0840 10/21/23 0356 10/22/23 0330  WBC 8.2 9.1 8.8  RBC 5.12 4.91 4.82  HGB 15.3 14.6 14.4  HCT 47.7 46.5 44.9  MCV 93.2 94.7 93.2  MCH 29.9 29.7 29.9  MCHC 32.1 31.4 32.1  RDW 13.9 14.1 13.7  PLT 198 214 219   Thyroid No results for input(s): "TSH", "FREET4" in the last 168 hours.  BNP Recent Labs  Lab 10/19/23 1146  BNP 58.0    DDimer No results for input(s): "DDIMER" in the last 168 hours.   Radiology    ECHOCARDIOGRAM COMPLETE Result Date: 10/21/2023    ECHOCARDIOGRAM REPORT   Patient Name:   Roger Lowe. Date of Exam: 10/21/2023 Medical Rec #:  [id number]     Height:       72.0 in Accession #:    [id number]    Weight:       192.7 lb Date of Birth:  Jun 29, 1946     BSA:           2.097 m Patient Age:    77 years      BP:           106/71 mmHg Patient Gender: M             HR:           89 bpm. [facility] Procedure: 2D Echo (Both Spectral and Color Flow Doppler were utilized during            procedure). Indications:    Dyspnea  History:        Patient has prior history of Echocardiogram examinations, most                 recent 08/17/2020. CHF, CAD, CKD 3a and COPD, Arrythmias:LBBB;                 Risk Factors:Hypertension, Diabetes and Former Smoker.  Sonographer:    Dondra Prader RVT RCS Referring Phys: [id number] Dorothe Pea Shainna Faux IMPRESSIONS  1. Left ventricular ejection fraction, by estimation, is 60 to 65%. The left ventricle has normal function. The left ventricle has no regional wall motion abnormalities. There is moderate left ventricular hypertrophy of the septal segment. Left ventricular diastolic parameters are consistent with Grade I diastolic dysfunction (impaired relaxation).  2. Right ventricular systolic function is normal. The right ventricular size is normal.  3. The mitral valve is normal in structure. No evidence of  mitral valve regurgitation. No evidence of mitral stenosis.  4. The aortic valve is tricuspid. There is mild calcification of the aortic valve. Aortic valve regurgitation is not visualized. No aortic stenosis is present.  5. The inferior vena cava is normal in size with greater than 50% respiratory variability, suggesting right atrial pressure of 3 mmHg. Comparison(s): No significant change from prior study. FINDINGS  Left Ventricle: Left ventricular ejection fraction, by estimation, is 60 to 65%. The left ventricle has normal function. The left ventricle has no regional wall motion abnormalities. Strain imaging was not performed. The left ventricular internal cavity  size was normal in size. There is moderate left ventricular hypertrophy of the septal segment. Left ventricular diastolic parameters are consistent with Grade I diastolic  dysfunction (impaired relaxation). Normal left ventricular filling pressure. Right Ventricle: The right ventricular size is normal. No increase in right ventricular wall thickness. Right ventricular systolic function is normal. Left Atrium: Left atrial size was normal in size. Right Atrium: Right atrial size was normal in size. Pericardium: There is no evidence of pericardial effusion. Mitral Valve: The mitral valve is normal in structure. No evidence of mitral valve regurgitation. No evidence of mitral valve stenosis. Tricuspid Valve: The tricuspid valve is normal in structure. Tricuspid valve regurgitation is trivial. No evidence of tricuspid stenosis. Aortic Valve: The aortic valve is tricuspid. There is mild calcification of the aortic valve. Aortic valve regurgitation is not visualized. No aortic stenosis is present. Aortic valve mean gradient measures 2.0 mmHg. Aortic valve peak gradient measures 3.9 mmHg. Aortic valve area, by VTI measures 2.96 cm. Pulmonic Valve: The pulmonic valve was normal in structure. Pulmonic valve regurgitation is trivial. No evidence of pulmonic stenosis. Aorta: The aortic root and ascending aorta are structurally normal, with no evidence of dilitation. Venous: The inferior vena cava is normal in size with greater than 50% respiratory variability, suggesting right atrial pressure of 3 mmHg. IAS/Shunts: The atrial septum is grossly normal. Additional Comments: 3D imaging was not performed. A device lead is visualized.  LEFT VENTRICLE PLAX 2D LVIDd:         3.65 cm   Diastology LVIDs:         2.40 cm   LV e' medial:    7.56 cm/s LV PW:         1.15 cm   LV E/e' medial:  5.0 LV IVS:        1.25 cm   LV e' lateral:   7.29 cm/s LVOT diam:     2.10 cm   LV E/e' lateral: 5.2 LV SV:         49 LV SV Index:   23 LVOT Area:     3.46 cm  RIGHT VENTRICLE             IVC RV Basal diam:  3.40 cm     IVC diam: 1.30 cm RV S prime:     10.80 cm/s TAPSE (M-mode): 1.7 cm LEFT ATRIUM             Index         RIGHT ATRIUM           Index LA diam:        3.20 cm 1.53 cm/m   RA Area:     14.60 cm LA Vol (A2C):   25.7 ml 12.26 ml/m  RA Volume:   41.70 ml  19.88 ml/m LA Vol (A4C):   20.2 ml 9.63 ml/m LA Biplane Vol: 24.1  ml 11.49 ml/m  AORTIC VALVE                    PULMONIC VALVE AV Area (Vmax):    3.03 cm     PV Vmax:          0.57 m/s AV Area (Vmean):   2.95 cm     PV Peak grad:     1.3 mmHg AV Area (VTI):     2.96 cm     PR End Diast Vel: 10.24 msec AV Vmax:           98.20 cm/s AV Vmean:          65.600 cm/s AV VTI:            0.165 m AV Peak Grad:      3.9 mmHg AV Mean Grad:      2.0 mmHg LVOT Vmax:         85.90 cm/s LVOT Vmean:        55.800 cm/s LVOT VTI:          0.141 m LVOT/AV VTI ratio: 0.85  AORTA Ao Asc diam: 3.10 cm MITRAL VALVE               TRICUSPID VALVE MV Area (PHT): 6.83 cm    TR Peak grad:   23.4 mmHg MV Decel Time: 111 msec    TR Vmax:        242.00 cm/s MV E velocity: 37.80 cm/s MV A velocity: 76.40 cm/s  SHUNTS MV E/A ratio:  0.49        Systemic VTI:  0.14 m                            Systemic Diam: 2.10 cm Vishnu Priya Mallipeddi Electronically signed by Winfield Rast Mallipeddi Signature Date/Time: 10/21/2023/3:32:51 PM    Final     Cardiac Studies     Patient Profile     Roger Lowe. is a 78 y.o. male with a hx of HFimpEF, COPD on home O2, chronic pulmonary MAC, who is being seen 10/19/2023 for the evaluation of SOB and chest pain at the request of Dr Durwin Nora.   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 Reports recent compliance   - atypical chest pressure constant lasting for days -suspect related to SVT and volume overload - no evidence of ACS by EKG or enzymes. Follow symptoms with management of arrhythmia and volume.  - no plans for ischemic testing at  this time.  Symptoms improving with rate control and diuresis   2. SOB - BNP not elevated, CXR clear. Corevue from device has suggested some fluid accumulation. By exam - long history of lung disease with COPD, lung CA on cancer. Defer any lung workup per primary team - suspected related to SVT leading to some fluid overload.    3. 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 - 10/2018 cath: nonobstructive CAD. 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 Corevue check from home today suggesting fluid accumulation.  - suspect SVT leading to some volume overload.  - diuresed this admission, uptrend in Cr IV diuretics stopped. Cr trending back down, restart his home lasix 40mg  daily. Exacerbation more related to SVT as opposed to inadequate diuretic dosing.    - got IV lasix 40mg  x 2 . I/Os incomplete. Weights appear inaccurate. Reds vest yesterday was 27%. Significant uptrend in Cr, hold diuretic today. Perhaps restart oral tomorrow, could continue his home diuretic, exacerbation more related to SVT as opposed to insufficent diuretic.          4. Wide complex tachycardia - this is not VT. This is SVT with his paced ventricular complexes. Can see interruptions of rhythm with sinus beats with paced ventricular beats, follow by recurrent runs of SVT up to 120s to 130s.  - dose IV lopressor 5mg  in ER, can redose as needed -  increased his home coreg to 37.5mg  bid.  - morphology of paced QRS different morphology from prior BiV tracings, has RBBB morphology. Will touch base with EP see if anything additoinal needed as outpatient, device check Jan 2025 was normal. Nonurgent issue and with inclement weather did not have tech come assess during admission.    - SVT resolved, continue higher dose  beta blocker.      5. History of recurrent DVT/PE - most recent event 05/2023 in setting of xarelto noncompliance - reports strict xarelto compliance.   Ok for discharge from cardiac standpoint, we will sign off inpatient care.     For questions or updates, please contact [facility] Please consult [email or web address] for contact info under        Signed, Dina Rich, MD  10/22/2023, 10:27 AM

## 2023-10-22 NOTE — TOC Transition Note (Addendum)
Transition of Care [facility]) - Discharge Note   Patient Details  Name: Roger Lowe. MRN: [id number] Date of Birth: [birth year]  [facility]) CM/SW Contact:  Beather Arbour Phone Number: 10/22/2023, 12:26 PM   Clinical Narrative:    Patient is scheduled to DC today . CSW spoke with pt about PT recommendation for [facility]. Patient agreeable to [facility] providing services. Patient asked for HHPT and HHOT. Victorino Dike able to accept referral. MD asked to order [facility] orders. Patient stated that his brother would pick him up. CSW did ask pt about home oxygen and patient confirmed that he had O2 at home and is on 2-4 liters. TOC signing off.    Final next level of care: Home w Home Health Services ([facility]) Barriers to Discharge: Barriers Resolved   Patient Goals and CMS Choice Patient states their goals for this hospitalization and ongoing recovery are:: DC home with [facility] CMS Medicare.gov Compare Post Acute Care list provided to:: Patient Choice offered to / list presented to : Patient      Discharge Placement                Patient to be transferred to facility by: POV-Brother Name of family member notified: Patient Patient and family notified of of transfer: 10/22/23  Discharge Plan and Services Additional resources added to the After Visit Summary for                  DME Arranged: N/A DME Agency: NA       HH Arranged: PT, OT HH Agency: [facility] Date HH Agency Contacted: 10/22/23 Time HH Agency Contacted: 1225 Representative spoke with at [facility] Agency: Victorino Dike  Social Drivers of Health (SDOH) Interventions SDOH Screenings   Food Insecurity: No Food Insecurity (10/19/2023)  Housing: Low Risk  (10/19/2023)  Transportation Needs: No Transportation Needs (10/19/2023)  Utilities: Not At Risk (10/19/2023)  Depression (PHQ2-9): Low Risk  (05/23/2021)  Recent Concern: Depression (PHQ2-9) - Medium Risk (02/25/2021)  Social Connections: Unknown (10/19/2023)   Tobacco Use: Medium Risk (10/19/2023)     Readmission Risk Interventions    10/22/2023   12:24 PM  Readmission Risk Prevention Plan  Transportation Screening Complete  Home Care Screening Complete  Medication Review (RN CM) Complete

## 2023-10-22 NOTE — Discharge Summary (Signed)
Physician Discharge Summary   Patient: Roger Lowe. MRN: [id number] DOB: [birth year]  Admit date:     10/19/2023  Discharge date: 10/22/23  Discharge Physician: Deanna Artis   PCP: Benita Stabile, MD   Recommendations at discharge:   At this time patient will be discharged home with home health.  If you experience any symptoms such as fever, vomiting, shortness of breath, chest pain, abdominal pain, or other concerning symptoms, please call your primary care provider or go to the emergency department immediately.  Discharge Diagnoses: Principal Problem:   Dyspnea Active Problems:   Chronic systolic heart failure (HCC)   Hyperglycemia due to type 2 diabetes mellitus (HCC)   Chronic hypoxemic respiratory failure (HCC)   Chronic pulmonary embolism (HCC)   DNR (do not resuscitate)   Stage 3 severe COPD by GOLD classification (HCC)   Mycobacterium abscessus infection   Gout   Bronchiectasis (HCC)   Essential hypertension   Benign prostatic hyperplasia with lower urinary tract symptoms  Resolved Problems:   * No resolved hospital problems. [facility] Course:  Patient 78 year old with history of HFrEF, COPD on 2-3 L, chronic pulmonary MAC, presenting with worse sitting shortness of breath.   Assessment and Plan:  Acute on chronic hypoxic respiratory failure -multifactorial etiology given patient's underlying COPD, pulmonary MAC, HFrEF.  No fever.  No cough to suggest acute bacterial/viral pneumonia.  Mildly volume overloaded per Corevue device noted on presentation.  Cardiology following closely.  Appears to be resolving.  Acute exacerbation of chronic chronic HFrEF -LV EF as low as 20-25%, normalized after BiV pacemaker placement.  Reports dyspnea and chest heaviness over the past 2 weeks.  Not hypoxic.  CorVue thoracic impedance -suggest fluid accumulation.  However BNP unremarkable at 58.  Cardiology following closely.  Suggesting possible exacerbation secondary to SVT.   Showing improvement in rate control.  Responded well to IV Lasix 40 mg x 2 doses.  Can resume normal home medication regiment upon discharge, including p.o. Lasix.   Supraventricular tachycardia - Initially appearing as a wide-complex tachycardia, likely SVT with aberrancy or paced ventricular complexes.  Appears to be showing improvement after increase in p.o. metoprolol to 37.5 mg twice daily.  Prescription for increased metoprolol dose provided.   Acute kidney injury - BUN showing mild elevation, likely exacerbated by diuresis.  Resolved after diuresis.   Uncontrolled diabetes mellitus type 2 -A1c 10.9 suggesting very poor control prior to admission.  While inpatient was receiving twice daily glargine with insulin sliding scale.  Showing improvement while inpatient although did receive low glucose value this morning.  On discharge will transition patient to insulin glargine 30 units twice daily.  Resume previous short acting insulin regimen.  Would recommend patient continue checking glucose multiple times per day.  Recommend close follow-up with PCP or endocrinology to titrate insulin regiment and to recheck A1c.   History of pulmonary embolism  -Xarelto on board   Benign prostatic hyperplasia -Continue Flomax   Essential hypertension -Resume home antihypertensive regimen   Chronic bronchiectasis/chronic pulmonary MAC -Continue chronic azithromycin per patient for about ~12 years now.   Gout -Continue allopurinol   Stage 3 severe COPD -Not appear to be acutely exacerbated.  Resume home medication regimen, nebulizers on board.   Physical debilitation and muscle weakness - Patient's still continues to be a bit weak.  PT recommending home health       Pain control - [facility] Controlled Substance Reporting System database was reviewed. and patient was  instructed, not to drive, operate heavy machinery, perform activities at heights, swimming or participation in water  activities or provide baby-sitting services while on Pain, Sleep and Anxiety Medications; until their outpatient Physician has advised to do so again. Also recommended to not to take more than prescribed Pain, Sleep and Anxiety Medications.  Consultants: Cardiology Procedures performed: None Disposition: Home health Diet recommendation:  Discharge Diet Orders (From admission, onward)     Start     Ordered   10/22/23 0000  Diet - low sodium heart healthy        10/22/23 1230           Cardiac and Carb modified diet DISCHARGE MEDICATION: Allergies as of 10/22/2023       Reactions   Entresto [sacubitril-valsartan] Swelling   Ace Inhibitors Swelling        Medication List     STOP taking these medications    insulin detemir 100 UNIT/ML FlexPen Commonly known as: Levemir FlexTouch       TAKE these medications    albuterol 108 (90 Base) MCG/ACT inhaler Commonly known as: VENTOLIN HFA Inhale 1-2 puffs into the lungs every 6 (six) hours as needed for shortness of breath or wheezing.   allopurinol 100 MG tablet Commonly known as: ZYLOPRIM Take 100 mg by mouth daily.   ALPRAZolam 0.5 MG tablet Commonly known as: XANAX Take 0.5 mg by mouth 2 (two) times daily as needed for anxiety.   amitriptyline 50 MG tablet Commonly known as: ELAVIL Take 50 mg by mouth 2 (two) times daily.   amLODipine 10 MG tablet Commonly known as: NORVASC Take 10 mg by mouth daily.   arformoterol 15 MCG/2ML Nebu Commonly known as: BROVANA USE 1 VIAL  IN  NEBULIZER TWICE  DAILY - Morning And Evening   atorvastatin 40 MG tablet Commonly known as: LIPITOR Take 40 mg by mouth at bedtime.   azithromycin 500 MG tablet Commonly known as: ZITHROMAX TAKE 1 TABLET BY MOUTH ON MONDAY, WEDNESDAY AND FRIDAY   budesonide 0.5 MG/2ML nebulizer solution Commonly known as: PULMICORT USE 1 VIAL  IN  NEBULIZER TWICE  DAILY - Rinse Mouth After Treatment   carvedilol 12.5 MG tablet Commonly known as:  COREG Take 3 tablets (37.5 mg total) by mouth 2 (two) times daily with a meal. What changed:  medication strength how much to take when to take this   doxycycline 100 MG tablet Commonly known as: VIBRA-TABS Take 100 mg by mouth 2 (two) times daily.   ergocalciferol 1.25 MG (50000 UT) capsule Commonly known as: VITAMIN D2 Take 50,000 Units by mouth every Tuesday.   Farxiga 10 MG Tabs tablet Generic drug: dapagliflozin propanediol Take 10 mg by mouth daily.   furosemide 40 MG tablet Commonly known as: LASIX Take 40 mg by mouth daily.   gabapentin 100 MG capsule Commonly known as: NEURONTIN Take 100 mg by mouth 3 (three) times daily.   hydrALAZINE 50 MG tablet Commonly known as: APRESOLINE Take 1.5 tablets (75 mg total) by mouth 3 (three) times daily.   insulin glargine-yfgn 100 UNIT/ML injection Commonly known as: SEMGLEE Inject 0.3 mLs (30 Units total) into the skin 2 (two) times daily.   ipratropium-albuterol 0.5-2.5 (3) MG/3ML Soln Commonly known as: DUONEB USE 1 VIAL IN NEBULIZER EVERY 6 HOURS - And As Needed (For Rescue -MAX 30 DOSES PER MONTH) What changed: See the new instructions.   methocarbamol 750 MG tablet Commonly known as: ROBAXIN Take 750 mg by mouth 2 (two)  times daily as needed for muscle spasms.   methylPREDNISolone 4 MG tablet Commonly known as: MEDROL Take 8 mg by mouth daily as needed (pain in back).   nitroGLYCERIN 0.4 MG SL tablet Commonly known as: NITROSTAT PLACE 1 TABLET UNDER THE TONGUE EVERY 5 (FIVE) MINUTES AS NEEDED FOR CHEST PAIN  AS DIRECTED   NovoLOG FlexPen 100 UNIT/ML FlexPen Generic drug: insulin aspart Inject 25-40 Units into the skin 3 (three) times daily with meals. Sliding scale   OVER THE COUNTER MEDICATION Compression vest   oxyCODONE-acetaminophen 10-325 MG tablet Commonly known as: PERCOCET Take 1 tablet by mouth every 6 (six) hours as needed for pain.   OXYGEN Inhale 2-4 L into the lungs continuous.    pantoprazole 40 MG tablet Commonly known as: PROTONIX Take 40 mg by mouth in the morning and at bedtime.   ReliOn Pen Needles 31G X 6 MM Misc Generic drug: Insulin Pen Needle USE 1 PEN NEEDLE 4 TIMES DAILY   rivaroxaban 20 MG Tabs tablet Commonly known as: XARELTO Take 1 tablet (20 mg total) by mouth every morning.   sodium chloride 0.65 % Soln nasal spray Commonly known as: OCEAN Place 1 spray into both nostrils as needed for congestion.   Spiriva Respimat 2.5 MCG/ACT Aers Generic drug: Tiotropium Bromide Monohydrate INHALE 2 PUFFS INTO THE LUNGS DAILY.   spironolactone 25 MG tablet Commonly known as: ALDACTONE Take 1 tablet (25 mg total) by mouth daily. Start taking on: October 23, 2023   tamsulosin 0.4 MG Caps capsule Commonly known as: FLOMAX Take 1 capsule (0.4 mg total) by mouth daily.        Follow-up Information     Ellsworth Lennox, PA-C Follow up.   Specialties: Cardiology, Cardiology Why: Cardiology Hosptial Follow-up on 11/18/2023 at 3:30 PM. Contact information: [address] [place] [address] [phone]         [facility] Follow up.   Why: Home Health will call you to schedule first home visit for PT/OT services.               Discharge Exam: Filed Weights   10/19/23 2315 10/20/23 1000 10/22/23 0500  Weight: 99.1 kg 87.4 kg 88.7 kg   GENERAL:  Alert, pleasant, no acute distress  HEENT:  EOMI, nasal cannula CARDIOVASCULAR:  RRR, no murmurs appreciated RESPIRATORY: Poor air movement laterally, no wheezing appreciated GASTROINTESTINAL:  Soft, nontender, nondistended EXTREMITIES:  No LE edema bilaterally NEURO:  No new focal deficits appreciated SKIN:  No rashes noted PSYCH:  Appropriate mood and affect   Condition at discharge: improving  The results of significant diagnostics from this hospitalization (including imaging, microbiology, ancillary and laboratory) are listed below for reference.   Imaging  Studies: ECHOCARDIOGRAM COMPLETE Result Date: 10/21/2023    ECHOCARDIOGRAM REPORT   Patient Name:   Donoven Pett. Date of Exam: 10/21/2023 Medical Rec #:  [id number]     Height:       72.0 in Accession #:    [id number]    Weight:       192.7 lb Date of Birth:  07-15-1946     BSA:          2.097 m Patient Age:    77 years      BP:           106/71 mmHg Patient Gender: M             HR:  89 bpm. [facility] Procedure: 2D Echo (Both Spectral and Color Flow Doppler were utilized during            procedure). Indications:    Dyspnea  History:        Patient has prior history of Echocardiogram examinations, most                 recent 08/17/2020. CHF, CAD, CKD 3a and COPD, Arrythmias:LBBB;                 Risk Factors:Hypertension, Diabetes and Former Smoker.  Sonographer:    Dondra Prader RVT RCS Referring Phys: [id number] Dorothe Pea BRANCH IMPRESSIONS  1. Left ventricular ejection fraction, by estimation, is 60 to 65%. The left ventricle has normal function. The left ventricle has no regional wall motion abnormalities. There is moderate left ventricular hypertrophy of the septal segment. Left ventricular diastolic parameters are consistent with Grade I diastolic dysfunction (impaired relaxation).  2. Right ventricular systolic function is normal. The right ventricular size is normal.  3. The mitral valve is normal in structure. No evidence of mitral valve regurgitation. No evidence of mitral stenosis.  4. The aortic valve is tricuspid. There is mild calcification of the aortic valve. Aortic valve regurgitation is not visualized. No aortic stenosis is present.  5. The inferior vena cava is normal in size with greater than 50% respiratory variability, suggesting right atrial pressure of 3 mmHg. Comparison(s): No significant change from prior study. FINDINGS  Left Ventricle: Left ventricular ejection fraction, by estimation, is 60 to 65%. The left ventricle has normal function. The left ventricle has no  regional wall motion abnormalities. Strain imaging was not performed. The left ventricular internal cavity  size was normal in size. There is moderate left ventricular hypertrophy of the septal segment. Left ventricular diastolic parameters are consistent with Grade I diastolic dysfunction (impaired relaxation). Normal left ventricular filling pressure. Right Ventricle: The right ventricular size is normal. No increase in right ventricular wall thickness. Right ventricular systolic function is normal. Left Atrium: Left atrial size was normal in size. Right Atrium: Right atrial size was normal in size. Pericardium: There is no evidence of pericardial effusion. Mitral Valve: The mitral valve is normal in structure. No evidence of mitral valve regurgitation. No evidence of mitral valve stenosis. Tricuspid Valve: The tricuspid valve is normal in structure. Tricuspid valve regurgitation is trivial. No evidence of tricuspid stenosis. Aortic Valve: The aortic valve is tricuspid. There is mild calcification of the aortic valve. Aortic valve regurgitation is not visualized. No aortic stenosis is present. Aortic valve mean gradient measures 2.0 mmHg. Aortic valve peak gradient measures 3.9 mmHg. Aortic valve area, by VTI measures 2.96 cm. Pulmonic Valve: The pulmonic valve was normal in structure. Pulmonic valve regurgitation is trivial. No evidence of pulmonic stenosis. Aorta: The aortic root and ascending aorta are structurally normal, with no evidence of dilitation. Venous: The inferior vena cava is normal in size with greater than 50% respiratory variability, suggesting right atrial pressure of 3 mmHg. IAS/Shunts: The atrial septum is grossly normal. Additional Comments: 3D imaging was not performed. A device lead is visualized.  LEFT VENTRICLE PLAX 2D LVIDd:         3.65 cm   Diastology LVIDs:         2.40 cm   LV e' medial:    7.56 cm/s LV PW:         1.15 cm   LV E/e' medial:  5.0 LV  IVS:        1.25 cm   LV e'  lateral:   7.29 cm/s LVOT diam:     2.10 cm   LV E/e' lateral: 5.2 LV SV:         49 LV SV Index:   23 LVOT Area:     3.46 cm  RIGHT VENTRICLE             IVC RV Basal diam:  3.40 cm     IVC diam: 1.30 cm RV S prime:     10.80 cm/s TAPSE (M-mode): 1.7 cm LEFT ATRIUM             Index        RIGHT ATRIUM           Index LA diam:        3.20 cm 1.53 cm/m   RA Area:     14.60 cm LA Vol (A2C):   25.7 ml 12.26 ml/m  RA Volume:   41.70 ml  19.88 ml/m LA Vol (A4C):   20.2 ml 9.63 ml/m LA Biplane Vol: 24.1 ml 11.49 ml/m  AORTIC VALVE                    PULMONIC VALVE AV Area (Vmax):    3.03 cm     PV Vmax:          0.57 m/s AV Area (Vmean):   2.95 cm     PV Peak grad:     1.3 mmHg AV Area (VTI):     2.96 cm     PR End Diast Vel: 10.24 msec AV Vmax:           98.20 cm/s AV Vmean:          65.600 cm/s AV VTI:            0.165 m AV Peak Grad:      3.9 mmHg AV Mean Grad:      2.0 mmHg LVOT Vmax:         85.90 cm/s LVOT Vmean:        55.800 cm/s LVOT VTI:          0.141 m LVOT/AV VTI ratio: 0.85  AORTA Ao Asc diam: 3.10 cm MITRAL VALVE               TRICUSPID VALVE MV Area (PHT): 6.83 cm    TR Peak grad:   23.4 mmHg MV Decel Time: 111 msec    TR Vmax:        242.00 cm/s MV E velocity: 37.80 cm/s MV A velocity: 76.40 cm/s  SHUNTS MV E/A ratio:  0.49        Systemic VTI:  0.14 m                            Systemic Diam: 2.10 cm Vishnu Priya Mallipeddi Electronically signed by Winfield Rast Mallipeddi Signature Date/Time: 10/21/2023/3:32:51 PM    Final    DG Chest 2 View Result Date: 10/19/2023 CLINICAL DATA:  Chest pain EXAM: CHEST - 2 VIEW COMPARISON:  07/29/2023. FINDINGS: Bilateral lung fields are clear. Bilateral costophrenic angles are clear. Note is made of elevated right hemidiaphragm. Normal cardio-mediastinal silhouette. There is a left sided 3-lead pacemaker. No acute osseous abnormalities. The soft tissues are within normal limits. IMPRESSION: No active cardiopulmonary disease. Electronically Signed   By:  Timoteo Expose.D.  On: 10/19/2023 13:36    Microbiology: Results for orders placed or performed during the hospital encounter of 10/19/23  Resp panel by RT-PCR (RSV, Flu A&B, Covid) Anterior Nasal Swab     Status: None   Collection Time: 10/19/23 12:53 PM   Specimen: Anterior Nasal Swab  Result Value Ref Range Status   SARS Coronavirus 2 by RT PCR NEGATIVE NEGATIVE Final    Comment: (NOTE) SARS-CoV-2 target nucleic acids are NOT DETECTED.  The SARS-CoV-2 RNA is generally detectable in upper respiratory specimens during the acute phase of infection. The lowest concentration of SARS-CoV-2 viral copies this assay can detect is 138 copies/mL. A negative result does not preclude SARS-Cov-2 infection and should not be used as the sole basis for treatment or other patient management decisions. A negative result may occur with  improper specimen collection/handling, submission of specimen other than nasopharyngeal swab, presence of viral mutation(s) within the areas targeted by this assay, and inadequate number of viral copies(<138 copies/mL). A negative result must be combined with clinical observations, patient history, and epidemiological information. The expected result is Negative.  Fact Sheet for Patients:  BloggerCourse.com  Fact Sheet for Healthcare Providers:  SeriousBroker.it  This test is no t yet approved or cleared by the [place] FDA and  has been authorized for detection and/or diagnosis of SARS-CoV-2 by FDA under an Emergency Use Authorization (EUA). This EUA will remain  in effect (meaning this test can be used) for the duration of the COVID-19 declaration under Section 564(b)(1) of the Act, 21 U.S.C.section 360bbb-3(b)(1), unless the authorization is terminated  or revoked sooner.       Influenza A by PCR NEGATIVE NEGATIVE Final   Influenza B by PCR NEGATIVE NEGATIVE Final    Comment: (NOTE) The Xpert  Xpress SARS-CoV-2/FLU/RSV plus assay is intended as an aid in the diagnosis of influenza from Nasopharyngeal swab specimens and should not be used as a sole basis for treatment. Nasal washings and aspirates are unacceptable for Xpert Xpress SARS-CoV-2/FLU/RSV testing.  Fact Sheet for Patients: BloggerCourse.com  Fact Sheet for Healthcare Providers: SeriousBroker.it  This test is not yet approved or cleared by the [place] FDA and has been authorized for detection and/or diagnosis of SARS-CoV-2 by FDA under an Emergency Use Authorization (EUA). This EUA will remain in effect (meaning this test can be used) for the duration of the COVID-19 declaration under Section 564(b)(1) of the Act, 21 U.S.C. section 360bbb-3(b)(1), unless the authorization is terminated or revoked.     Resp Syncytial Virus by PCR NEGATIVE NEGATIVE Final    Comment: (NOTE) Fact Sheet for Patients: BloggerCourse.com  Fact Sheet for Healthcare Providers: SeriousBroker.it  This test is not yet approved or cleared by the [place] FDA and has been authorized for detection and/or diagnosis of SARS-CoV-2 by FDA under an Emergency Use Authorization (EUA). This EUA will remain in effect (meaning this test can be used) for the duration of the COVID-19 declaration under Section 564(b)(1) of the Act, 21 U.S.C. section 360bbb-3(b)(1), unless the authorization is terminated or revoked.  Performed at [facility], [address], [place], [place] [address]     Labs: CBC: Recent Labs  Lab 10/19/23 [phone] 10/19/23 1146 10/20/23 0840 10/21/23 0356 10/22/23 0330  WBC 9.0 9.4 8.2 9.1 8.8  NEUTROABS 6.2 6.7 5.7 5.9 6.0  HGB 15.8 15.8 15.3 14.6 14.4  HCT 49.0 49.6 47.7 46.5 44.9  MCV 93.5 93.9 93.2 94.7 93.2  PLT 201 202 198 214 219   Basic  Metabolic Panel: Recent Labs  Lab 10/19/23 0958 10/19/23 1146  10/19/23 1153 10/20/23 0920 10/21/23 0356 10/22/23 0330  NA 136 137  --  134* 135 136  K 4.1 4.3  --  4.3 3.6 4.2  CL 98 99  --  99 98 102  CO2 26 25  --  24 28 25   GLUCOSE 308* 305*  --  323* 97 53*  BUN 10 10  --  12 17 18   CREATININE 1.23 1.29*  --  1.19 1.56* 1.24  CALCIUM 9.9 10.2  --  9.5 9.1 9.3  MG  --   --  1.9  --   --   --    Liver Function Tests: Recent Labs  Lab 10/19/23 0958  AST 16  ALT 13  ALKPHOS 89  BILITOT 0.9  PROT 8.2*  ALBUMIN 3.5   CBG: Recent Labs  Lab 10/21/23 1130 10/21/23 1649 10/21/23 2041 10/22/23 0733 10/22/23 1139  GLUCAP 172* 97 104* 121* 244*    Discharge time spent: greater than 30 minutes.  Signed: Deanna Artis, DO [facility] 10/22/2023

## 2023-10-22 NOTE — Inpatient Diabetes Management (Signed)
Inpatient Diabetes Program Recommendations  AACE/ADA: New Consensus Statement on Inpatient Glycemic Control   Target Ranges:  Prepandial:   less than 140 mg/dL      Peak postprandial:   less than 180 mg/dL (1-2 hours)      Critically ill patients:  140 - 180 mg/dL    Latest Reference Range & Units 10/22/23 03:30  Glucose 70 - 99 mg/dL 53 (L)    Latest Reference Range & Units 10/21/23 07:36 10/21/23 11:30 10/21/23 16:49 10/21/23 20:41 10/22/23 07:33  Glucose-Capillary 70 - 99 mg/dL 102 (H) 725 (H) 97 366 (H) 121 (H)    Review of Glycemic Control   Diabetes history: DM2 Outpatient Diabetes medications: Basal insulin 50 units qhs (pt does not remember the name), Novolog 25 units tid before meals, Farxiga 10 mg Daily  Current orders for Inpatient glycemic control: Semglee 40 units daily, Semglee 30 units at bedtime, Novolog 6 units TID with meals, Novolog 0-15 units TID with meals  Inpatient Diabetes Program Recommendations:    Insulin: Lab glucose 53 mg/dl today at 4:40 am. Patient takes 50 units of basal insulin at bedtime as outpatient. Please consider discontinuing Semglee 30 units at bedtime and change am Semglee to 45 units daily.  Thanks, Orlando Penner, RN, MSN, CDCES Diabetes Coordinator Inpatient Diabetes Program [phone] (Team Pager from 8am to 5pm)

## 2023-10-22 NOTE — Plan of Care (Signed)
   Problem: Education: Goal: Knowledge of General Education information will improve Description Including pain rating scale, medication(s)/side effects and non-pharmacologic comfort measures Outcome: Progressing   Problem: Health Behavior/Discharge Planning: Goal: Ability to manage health-related needs will improve Outcome: Progressing

## 2023-10-22 NOTE — Care Management Important Message (Signed)
Important Message  Patient Details  Name: Roger Lowe. MRN: [id number] Date of Birth: [birth year]   Important Message Given:  N/A - LOS <3 / Initial given by admissions     Corey Harold 10/22/2023, 2:33 PM

## 2023-10-23 ENCOUNTER — Telehealth: Payer: Self-pay

## 2023-10-23 NOTE — Transitions of Care (Post Inpatient/ED Visit) (Signed)
   10/23/2023  Name: Roger Lowe. MRN: [id number] DOB: 12-12-1945  Today's TOC FU Call Status: Today's TOC FU Call Status:: Unsuccessful Call (1st Attempt) Unsuccessful Call (1st Attempt) Date: 10/23/23  Attempted to reach the patient regarding the most recent Inpatient/ED visit. Patient was called in an Outreach attempt to offer VBCI  30-day TOC program. Pt is eligible for program due to potential risk for readmission and/or high utilization. Unfortunately, I was not able to speak with the patient in regards to recent hospital discharge    Patient's voicemail has  a generic greeting. To maintain HIPAA compliance, left message with VBCI CM contact information only  and a request for a call back .  Follow Up Plan: Additional outreach attempts will be made to reach the patient to complete the Transitions of Care (Post Inpatient/ED visit) call.   Susa Loffler , BSN, RN [facility]   VBCI-Population Health RN Care Manager Direct Dial [phone]  Fax: [phone] Website: [email or web address]

## 2023-10-26 ENCOUNTER — Inpatient Hospital Stay: Payer: Medicare HMO | Admitting: Hematology

## 2023-10-26 ENCOUNTER — Telehealth: Payer: Self-pay

## 2023-10-26 ENCOUNTER — Other Ambulatory Visit: Payer: Self-pay | Admitting: Cardiology

## 2023-10-26 ENCOUNTER — Telehealth: Payer: Self-pay | Admitting: Urology

## 2023-10-26 NOTE — Transitions of Care (Post Inpatient/ED Visit) (Signed)
 10/26/2023  Name: Roger Lowe. MRN: [id number] DOB: 1946-06-11  Today's TOC FU Call Status: Today's TOC FU Call Status:: Successful TOC FU Call Completed Unsuccessful Call (1st Attempt) Date: 10/23/23 [facility] FU Call Complete Date: 10/26/23 Patient's Name and Date of Birth confirmed.  Transition Care Management Follow-up Telephone Call Date of Discharge: 10/22/23 [name] Type of Discharge: Inpatient Admission Primary Inpatient Discharge Diagnosis:: Dyspnea     Chronic systolic heart failure (HCC) How have you been since you were released from the hospital?: Worse (c/o s/s UTI , painful urination) Any questions or concerns?: Yes Patient Questions/Concerns:: wants prev medication refilled Patient Questions/Concerns Addressed: Notified Provider of Patient Questions/Concerns  Items Reviewed: Did you receive and understand the discharge instructions provided?: Yes Medications obtained,verified, and reconciled?: Yes (Medications Reviewed) (Medication reconciliation completed based on recent discharge summary Patient taking medications as instructed and is aware of any changes or dosage adjustments medication regimen. Patient denies questions and reports no barriers to medication adherence) Any new allergies since your discharge?: No Dietary orders reviewed?: Yes Type of Diet Ordered:: Heart Healthy, Carb Modified Do you have support at home?: Yes People in Home: alone Name of Support/Comfort Primary Source: Brother  Medications Reviewed Today: Medications Reviewed Today     Reviewed by Johnnette Barrios, RN (Registered Nurse) on 10/26/23 at 1003  Med List Status: <None>   Medication Order Taking? Sig Documenting Provider Last Dose Status Informant  albuterol (VENTOLIN HFA) 108 (90 Base) MCG/ACT inhaler [id number] Yes Inhale 1-2 puffs into the lungs every 6 (six) hours as needed for shortness of breath or wheezing. Josephine Igo, DO Taking Active Self   allopurinol (ZYLOPRIM) 100 MG tablet [id number] Yes Take 100 mg by mouth daily. [provider] Taking Active Self  ALPRAZolam Prudy Feeler) 0.5 MG tablet [id number] Yes Take 0.5 mg by mouth 2 (two) times daily as needed for anxiety. [provider] Taking Active Self  amitriptyline (ELAVIL) 50 MG tablet [id number] Yes Take 50 mg by mouth 2 (two) times daily.  [provider] Taking Active Self  amLODipine (NORVASC) 10 MG tablet [id number] Yes Take 10 mg by mouth daily. [provider] Taking Active Self  arformoterol (BROVANA) 15 MCG/2ML NEBU [id number] Yes USE 1 VIAL  IN  NEBULIZER TWICE  DAILY - Morning And Evening Glenford Bayley, NP Taking Active Self  atorvastatin (LIPITOR) 40 MG tablet [id number] Yes Take 40 mg by mouth at bedtime. [provider] Taking Active Self  azithromycin (ZITHROMAX) 500 MG tablet [id number] No TAKE 1 TABLET BY MOUTH ON MONDAY, [facility] AND FRIDAY  Patient not taking: Reported on 10/26/2023   Chilton Greathouse, MD Not Taking Active Self  budesonide (PULMICORT) 0.5 MG/2ML nebulizer solution [id number] Yes USE 1 VIAL  IN  NEBULIZER TWICE  DAILY - Rinse Mouth After Treatment Glenford Bayley, NP Taking Active Self  carvedilol (COREG) 12.5 MG tablet [id number] Yes Take 3 tablets (37.5 mg total) by mouth 2 (two) times daily with a meal. Deanna Artis, DO Taking Active   doxycycline (VIBRA-TABS) 100 MG tablet [id number] Yes Take 100 mg by mouth 2 (two) times daily. [provider] Taking Active Self  ergocalciferol (VITAMIN D2) 1.25 MG (50000 UT) capsule [id number] Yes Take 50,000 Units by mouth every Tuesday. [provider] Taking Active Self  FARXIGA 10 MG TABS tablet [id number] Yes Take 10 mg by mouth daily. [provider] Taking Active Self  furosemide (LASIX) 40 MG tablet [id number] Yes Take 40 mg by  mouth daily. [provider] Taking Active Self  gabapentin (NEURONTIN) 100 MG capsule [id number] Yes  Take 100 mg by mouth 3 (three) times daily.  [provider] Taking Active Self           Med Note JUVENAL, UMAR Jan 09, 2020  1:57 PM)    hydrALAZINE (APRESOLINE) 50 MG tablet [id number]  Take 1.5 tablets (75 mg total) by mouth 3 (three) times daily. Antoine Poche, MD  Expired 10/19/23 2359 Self  insulin glargine-yfgn (SEMGLEE) 100 UNIT/ML injection [id number]  Inject 0.3 mLs (30 Units total) into the skin 2 (two) times daily. Deanna Artis, DO  Active   ipratropium-albuterol (DUONEB) 0.5-2.5 (3) MG/3ML Criss Rosales [id number] Yes USE 1 VIAL IN NEBULIZER EVERY 6 HOURS - And As Needed (For Rescue -MAX 30 DOSES PER MONTH)  Patient taking differently: Take 3 mLs by nebulization every 6 (six) hours as needed (Asthma). USE 1 VIAL IN NEBULIZER EVERY 6 HOURS As Needed (For Rescue -MAX 30 DOSES PER MONTH)   Glenford Bayley, NP Taking Active Self  methocarbamol (ROBAXIN) 750 MG tablet [id number] Yes Take 750 mg by mouth 2 (two) times daily as needed for muscle spasms. [provider] Taking Active Self  methylPREDNISolone (MEDROL) 4 MG tablet [id number] Yes Take 8 mg by mouth daily as needed (pain in back). [provider] Taking Active Self  nitroGLYCERIN (NITROSTAT) 0.4 MG SL tablet [id number] Yes PLACE 1 TABLET UNDER THE TONGUE EVERY 5 (FIVE) MINUTES AS NEEDED FOR CHEST PAIN  AS DIRECTED [place] Ouch Lennart Pall, PA-C Taking Active Self           Med Note Elesa Massed, Laqueta Carina Oct 19, 2023  9:41 PM) No known last dose  NOVOLOG FLEXPEN 100 UNIT/ML FlexPen [id number] Yes Inject 25-40 Units into the skin 3 (three) times daily with meals. Sliding scale [provider] Taking Active Self  OVER THE COUNTER MEDICATION [id number] Yes Compression vest  [provider] Taking Active Self  oxyCODONE-acetaminophen (PERCOCET) 10-325 MG tablet [id number] Yes Take 1 tablet by mouth every 6 (six) hours as needed for pain. [provider] Taking Active Self  OXYGEN  [id number] Yes Inhale 2-4 L into the lungs continuous. [provider] Taking Active Self           Med Note Wynelle Link   Tue May 07, 2021  8:34 AM)    pantoprazole (PROTONIX) 40 MG tablet [id number] Yes Take 40 mg by mouth in the morning and at bedtime. [provider] Taking Active Self  RELION PEN NEEDLES 31G X 6 MM MISC [id number] Yes USE 1 PEN NEEDLE 4 TIMES DAILY [provider] Taking Active Self  rivaroxaban (XARELTO) 20 MG TABS tablet [id number] Yes Take 1 tablet (20 mg total) by mouth every morning. Antoine Poche, MD Taking Active Self           Med Note Nori Riis   Mon Aug 13, 2020  1:27 PM)    sodium chloride (OCEAN) 0.65 % SOLN nasal spray [id number] Yes Place 1 spray into both nostrils as needed for congestion. [provider] Taking Active Self  SPIRIVA RESPIMAT 2.5 MCG/ACT AERS [id number] Yes INHALE 2 PUFFS INTO THE LUNGS DAILY. Josephine Igo, DO Taking Active Self  spironolactone (ALDACTONE) 25 MG tablet [id number] Yes Take 1 tablet (25 mg total) by mouth daily. Deanna Artis, DO Taking Active   tamsulosin (FLOMAX) 0.4 MG CAPS capsule [id number] Yes Take 1  capsule (0.4 mg total) by mouth daily. Donnita Falls, FNP Taking Active Self  Med List Note Lenoria Farrier, CPhT 10/31/19 1220): Compression Vest          Medication reconciliation / review completed based on most recent discharge summary and EHR medication list. Confirmed patient is taking all newly prescribed medications as instructed (any discrepancies are noted in review section)   Patient / Caregiver is aware of any changes to and / or  any dosage adjustments to medication regimen. Patient/ Caregiver denies questions at this time and reports no barriers to medication adherence.    Home Care and Equipment/Supplies: Were Home Health Services Ordered?: Yes Name of Home Health Agency:: [facility] [phone] Has Agency set up a time to come to your home?:  No EMR reviewed for Home Health Orders: Orders present/patient has not received call (refer to CM for follow-up) (called [facility] and verified PT/OT [facility] 2/27) Any new equipment or medical supplies ordered?: No  Functional Questionnaire: Do you need assistance with bathing/showering or dressing?: No Do you need assistance with meal preparation?: Yes (sometimes) Do you need assistance with eating?: No Do you have difficulty maintaining continence: No (reporting painful urination) Do you need assistance with getting out of bed/getting out of a chair/moving?: Yes (occ due to SOB and DOE) Do you have difficulty managing or taking your medications?: No  Follow up appointments reviewed: PCP Follow-up appointment confirmed?: Yes Date of PCP follow-up appointment?: 11/18/23 Follow-up Provider: Nita Sells Specialist [facility] Follow-up appointment confirmed?: Yes Date of Specialist follow-up appointment?: 11/18/23 Follow-Up Specialty Provider:: Cardiology  and Pacemaker  2/25 Do you need transportation to your follow-up appointment?: No Do you understand care options if your condition(s) worsen?: Yes-patient verbalized understanding  SDOH Interventions Today    Flowsheet Row Most Recent Value  SDOH Interventions   Food Insecurity Interventions Intervention Not Indicated  Housing Interventions Intervention Not Indicated  Transportation Interventions Intervention Not Indicated, Patient Resources (Friends/Family)  Utilities Interventions Intervention Not Indicated       Goals Addressed             This Visit's Progress    TOC Care Plan       Current Barriers:  Medication management polypharmacy and new medications Diet/Nutrition/Food Resources encourage fluids Provider appointments PCP and Cardiology Endocrinology Home Health services [facility] PT / OT  Equipment/DME O2 2-4 l/m  Functional/Safety fall risk  Chronic Disease Management support and 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   Interventions: Evaluation of current treatment plan related to  self management and patient's adherence to plan as established by provider  Transitions of Care:  New goal. Durable Medical Equipment (DME) needs assessed with patient/caregiver, reviewed with patient/caregiver, needs identified and provider notified, and orders reviewed Doctor Visits  - discussed the importance of doctor visits Contacted Health RN/OT/PT - [facility]  Post discharge activity limitations prescribed by provider reviewed Reviewed Signs and symptoms of infection  Heart Failure Interventions:  (Status:  New goal.) Short Term  Goal Provided education on low sodium diet Provided education about placing scale on hard, flat surface Advised patient to weigh each morning after emptying bladder Discussed importance of daily weight and advised patient to weigh and record daily Discussed the importance of keeping all appointments with provider Assessed social determinant of health barriers   Diabetes Interventions:  (Status:  New goal.) Short Term Goal Assessed patient's understanding of A1c goal: <8% Reviewed medications with patient and discussed importance of medication adherence Reviewed scheduled/upcoming provider appointments including: PCP and Cardiology  Assessed social determinant of health barriers Lab Results  Component Value Date   HGBA1C 10.9 (H)  10/19/2023    Patient Goals/Self-Care Activities: Participate in Transition of Care Program/Attend TOC scheduled calls Take all medications as prescribed Attend all scheduled provider appointments Call pharmacy for medication refills 3-7 days in advance of running out of medications Perform all self care activities independently  Perform IADL's (shopping, preparing meals, housekeeping, managing finances) independently Call provider office for new concerns or questions   Follow Up Plan:  Telephone follow up appointment with care management team member scheduled for:  3/4/@ 11:00am The patient has been provided with contact information for the care management team and has been advised to call with any health related questions or concerns.          Patient is at high risk for readmission and/or has history of  high utilization  Discussed VBCI  TOC program and weekly calls to patient to assess condition/status, medication management  and provide support/education as indicated . Patient/ Caregiver voiced understanding and is  agreeable to 30 day program    Routine follow-up and on-going assessment evaluation and education of disease processes, recommended interventions for both chronic and acute medical conditions , will occur during each weekly visit along with ongoing review of symptoms ,medication reviews and reconciliation. Any updates , inconsistencies, discrepancies or acute care concerns will be addressed and routed to the correct Practitioner if indicated   Based on current information and Insurance plan -Reviewed benefits available to patient, including details about eligibility options for care if any area of needs were identified.  Reviewed patients ability to access and / or navigating the benefits system..Amb Referral made if indicted , refer to orders section of note for details   Please refer to Care Plan for goals and interventions -Effectiveness of interventions, symptom  management and outcomes will be evaluated  weekly during [facility] 30-day Program Outreach calls  . Any necessary  changes and updates to Care Plan will be completed episodically    Reviewed goals for care Patient verbalizes understanding of instructions and care plan provided. Patient was encouraged to make informed decisions about their care, actively participate in managing their health condition, and implement lifestyle changes as needed to promote independence and self-management of health care   Patient was encouraged to Contact PCP with any changes in baseline or  medication regimen,  changes in health status  /  well-being, safety concerns, including falls any questions or concerns regarding ongoing medical care, any difficulty obtaining or picking up prescriptions, any changes or worsening in condition- including  symptoms not relieved  with interventions    The patient has been provided with contact information for the care management team and has been advised to call with any health-related questions or concerns. Follow up as indicated with Care Team , or sooner should any new problems arise.  Call placed to [facility] and requested add Skilled Nursing  LM with Dr Scharlene Gloss  office re patient reporting pain with urination Marcelino Duster) . Patient has also sent a  my chart message   Susa Loffler , BSN, RN [facility]   VBCI-Population Health RN Care Manager Direct Dial [phone]  Fax: [phone] Website: [email or web address]

## 2023-10-26 NOTE — Telephone Encounter (Signed)
 Sarah gave RX for uncontrollable urination. Needs refill

## 2023-10-26 NOTE — Telephone Encounter (Signed)
 Patient is made aware to contact pharmacy for Rx refilled for Flomax. Patient voiced understanding.

## 2023-10-27 ENCOUNTER — Inpatient Hospital Stay (INDEPENDENT_AMBULATORY_CARE_PROVIDER_SITE_OTHER): Payer: Medicare HMO

## 2023-10-27 DIAGNOSIS — I5022 Chronic systolic (congestive) heart failure: Secondary | ICD-10-CM

## 2023-10-27 DIAGNOSIS — Z95810 Presence of automatic (implantable) cardiac defibrillator: Secondary | ICD-10-CM

## 2023-10-28 ENCOUNTER — Observation Stay (HOSPITAL_COMMUNITY)
Admission: EM | Admit: 2023-10-28 | Discharge: 2023-10-30 | Disposition: A | Discharge status: Home / Self Care | Payer: Medicare HMO | Attending: Internal Medicine | Admitting: Internal Medicine

## 2023-10-28 ENCOUNTER — Emergency Department (HOSPITAL_COMMUNITY): Payer: Medicare HMO

## 2023-10-28 ENCOUNTER — Other Ambulatory Visit: Payer: Self-pay

## 2023-10-28 ENCOUNTER — Encounter (HOSPITAL_COMMUNITY): Payer: Self-pay

## 2023-10-28 DIAGNOSIS — Z95810 Presence of automatic (implantable) cardiac defibrillator: Secondary | ICD-10-CM | POA: Insufficient documentation

## 2023-10-28 DIAGNOSIS — Z86718 Personal history of other venous thrombosis and embolism: Secondary | ICD-10-CM | POA: Diagnosis not present

## 2023-10-28 DIAGNOSIS — J449 Chronic obstructive pulmonary disease, unspecified: Secondary | ICD-10-CM | POA: Diagnosis not present

## 2023-10-28 DIAGNOSIS — N1831 Chronic kidney disease, stage 3a: Secondary | ICD-10-CM | POA: Diagnosis not present

## 2023-10-28 DIAGNOSIS — Z8673 Personal history of transient ischemic attack (TIA), and cerebral infarction without residual deficits: Secondary | ICD-10-CM | POA: Insufficient documentation

## 2023-10-28 DIAGNOSIS — N401 Enlarged prostate with lower urinary tract symptoms: Secondary | ICD-10-CM | POA: Diagnosis present

## 2023-10-28 DIAGNOSIS — R1030 Lower abdominal pain, unspecified: Secondary | ICD-10-CM | POA: Diagnosis not present

## 2023-10-28 DIAGNOSIS — M109 Gout, unspecified: Secondary | ICD-10-CM | POA: Insufficient documentation

## 2023-10-28 DIAGNOSIS — R918 Other nonspecific abnormal finding of lung field: Secondary | ICD-10-CM | POA: Diagnosis not present

## 2023-10-28 DIAGNOSIS — Z794 Long term (current) use of insulin: Secondary | ICD-10-CM | POA: Diagnosis not present

## 2023-10-28 DIAGNOSIS — I5022 Chronic systolic (congestive) heart failure: Secondary | ICD-10-CM | POA: Diagnosis present

## 2023-10-28 DIAGNOSIS — Z85118 Personal history of other malignant neoplasm of bronchus and lung: Secondary | ICD-10-CM | POA: Insufficient documentation

## 2023-10-28 DIAGNOSIS — Z86711 Personal history of pulmonary embolism: Secondary | ICD-10-CM | POA: Insufficient documentation

## 2023-10-28 DIAGNOSIS — Z7984 Long term (current) use of oral hypoglycemic drugs: Secondary | ICD-10-CM | POA: Diagnosis not present

## 2023-10-28 DIAGNOSIS — I130 Hypertensive heart and chronic kidney disease with heart failure and stage 1 through stage 4 chronic kidney disease, or unspecified chronic kidney disease: Secondary | ICD-10-CM | POA: Diagnosis not present

## 2023-10-28 DIAGNOSIS — R079 Chest pain, unspecified: Secondary | ICD-10-CM | POA: Diagnosis not present

## 2023-10-28 DIAGNOSIS — I25118 Atherosclerotic heart disease of native coronary artery with other forms of angina pectoris: Secondary | ICD-10-CM | POA: Insufficient documentation

## 2023-10-28 DIAGNOSIS — Z950 Presence of cardiac pacemaker: Secondary | ICD-10-CM | POA: Diagnosis not present

## 2023-10-28 DIAGNOSIS — J479 Bronchiectasis, uncomplicated: Secondary | ICD-10-CM | POA: Diagnosis not present

## 2023-10-28 DIAGNOSIS — I10 Essential (primary) hypertension: Secondary | ICD-10-CM | POA: Diagnosis present

## 2023-10-28 DIAGNOSIS — E11649 Type 2 diabetes mellitus with hypoglycemia without coma: Principal | ICD-10-CM | POA: Insufficient documentation

## 2023-10-28 DIAGNOSIS — N281 Cyst of kidney, acquired: Secondary | ICD-10-CM | POA: Diagnosis not present

## 2023-10-28 DIAGNOSIS — Z87891 Personal history of nicotine dependence: Secondary | ICD-10-CM | POA: Insufficient documentation

## 2023-10-28 DIAGNOSIS — N400 Enlarged prostate without lower urinary tract symptoms: Secondary | ICD-10-CM | POA: Diagnosis not present

## 2023-10-28 DIAGNOSIS — N390 Urinary tract infection, site not specified: Secondary | ICD-10-CM | POA: Insufficient documentation

## 2023-10-28 DIAGNOSIS — I2782 Chronic pulmonary embolism: Secondary | ICD-10-CM | POA: Diagnosis present

## 2023-10-28 DIAGNOSIS — Z79899 Other long term (current) drug therapy: Secondary | ICD-10-CM | POA: Insufficient documentation

## 2023-10-28 DIAGNOSIS — E1122 Type 2 diabetes mellitus with diabetic chronic kidney disease: Secondary | ICD-10-CM | POA: Diagnosis not present

## 2023-10-28 DIAGNOSIS — Z7901 Long term (current) use of anticoagulants: Secondary | ICD-10-CM | POA: Insufficient documentation

## 2023-10-28 DIAGNOSIS — I471 Supraventricular tachycardia, unspecified: Secondary | ICD-10-CM | POA: Insufficient documentation

## 2023-10-28 DIAGNOSIS — R1031 Right lower quadrant pain: Secondary | ICD-10-CM | POA: Diagnosis not present

## 2023-10-28 DIAGNOSIS — E162 Hypoglycemia, unspecified: Principal | ICD-10-CM | POA: Diagnosis present

## 2023-10-28 DIAGNOSIS — J9611 Chronic respiratory failure with hypoxia: Secondary | ICD-10-CM | POA: Diagnosis present

## 2023-10-28 DIAGNOSIS — I25119 Atherosclerotic heart disease of native coronary artery with unspecified angina pectoris: Secondary | ICD-10-CM | POA: Diagnosis present

## 2023-10-28 DIAGNOSIS — R0600 Dyspnea, unspecified: Secondary | ICD-10-CM | POA: Diagnosis not present

## 2023-10-28 DIAGNOSIS — R319 Hematuria, unspecified: Secondary | ICD-10-CM

## 2023-10-28 DIAGNOSIS — N179 Acute kidney failure, unspecified: Secondary | ICD-10-CM

## 2023-10-28 DIAGNOSIS — R000 Tachycardia, unspecified: Secondary | ICD-10-CM | POA: Diagnosis not present

## 2023-10-28 DIAGNOSIS — E161 Other hypoglycemia: Secondary | ICD-10-CM | POA: Diagnosis not present

## 2023-10-28 LAB — URINALYSIS, W/ REFLEX TO CULTURE (INFECTION SUSPECTED)
Bacteria, UA: NONE SEEN
Bilirubin Urine: NEGATIVE
Glucose, UA: >=500 mg/dL — AB
Ketones, ur: NEGATIVE mg/dL
Leukocytes,Ua: NEGATIVE
Nitrite: NEGATIVE
Protein, ur: 30 mg/dL — AB
RBC / HPF: >50 RBC/hpf (ref 0–5)
Specific Gravity, Urine: 1.017 (ref 1.005–1.030)
pH: 6 (ref 5.0–8.0)

## 2023-10-28 LAB — COMPREHENSIVE METABOLIC PANEL
ALT: 16 U/L (ref 0–44)
AST: 16 U/L (ref 15–41)
Albumin: 3.2 g/dL — ABNORMAL LOW (ref 3.5–5.0)
Alkaline Phosphatase: 74 U/L (ref 38–126)
Anion gap: 11 (ref 5–15)
BUN: 20 mg/dL (ref 8–23)
CO2: 24 mmol/L (ref 22–32)
Calcium: 9.6 mg/dL (ref 8.9–10.3)
Chloride: 102 mmol/L (ref 98–111)
Creatinine, Ser: 1.46 mg/dL — ABNORMAL HIGH (ref 0.61–1.24)
GFR, Estimated: 50 mL/min — ABNORMAL LOW (ref >60)
Glucose, Bld: 43 mg/dL — CL (ref 70–99)
Potassium: 4 mmol/L (ref 3.5–5.1)
Sodium: 137 mmol/L (ref 135–145)
Total Bilirubin: 0.5 mg/dL (ref 0.0–1.2)
Total Protein: 7.8 g/dL (ref 6.5–8.1)

## 2023-10-28 LAB — CBC WITH DIFFERENTIAL/PLATELET
Abs Immature Granulocytes: 0.09 10*3/uL — ABNORMAL HIGH (ref 0.00–0.07)
Basophils Absolute: 0.1 10*3/uL (ref 0.0–0.1)
Basophils Relative: 1 %
Eosinophils Absolute: 0.1 10*3/uL (ref 0.0–0.5)
Eosinophils Relative: 1 %
HCT: 40.6 % (ref 39.0–52.0)
Hemoglobin: 12.9 g/dL — ABNORMAL LOW (ref 13.0–17.0)
Immature Granulocytes: 1 %
Lymphocytes Relative: 14 %
Lymphs Abs: 1.8 10*3/uL (ref 0.7–4.0)
MCH: 29.7 pg (ref 26.0–34.0)
MCHC: 31.8 g/dL (ref 30.0–36.0)
MCV: 93.5 fL (ref 80.0–100.0)
Monocytes Absolute: 1.3 10*3/uL — ABNORMAL HIGH (ref 0.1–1.0)
Monocytes Relative: 10 %
Neutro Abs: 9.3 10*3/uL — ABNORMAL HIGH (ref 1.7–7.7)
Neutrophils Relative %: 73 %
Platelets: 293 10*3/uL (ref 150–400)
RBC: 4.34 MIL/uL (ref 4.22–5.81)
RDW: 14.2 % (ref 11.5–15.5)
WBC: 12.6 10*3/uL — ABNORMAL HIGH (ref 4.0–10.5)
nRBC: 0 % (ref 0.0–0.2)

## 2023-10-28 LAB — TROPONIN I (HIGH SENSITIVITY): Troponin I (High Sensitivity): 10 ng/L (ref <18)

## 2023-10-28 LAB — CBG MONITORING, ED
Glucose-Capillary: 223 mg/dL — ABNORMAL HIGH (ref 70–99)
Glucose-Capillary: 173 mg/dL — ABNORMAL HIGH (ref 70–99)

## 2023-10-28 LAB — LACTIC ACID, PLASMA: Lactic Acid, Venous: 1.3 mmol/L (ref 0.5–1.9)

## 2023-10-28 MED ORDER — UMECLIDINIUM BROMIDE 62.5 MCG/ACT IN AEPB
1.0000 | INHALATION_SPRAY | Freq: Every day | RESPIRATORY_TRACT | Status: DC
  Administered 2023-10-29 – 2023-10-30 (×2): 1 via RESPIRATORY_TRACT
  Filled 2023-10-28: qty 7

## 2023-10-28 MED ORDER — ALLOPURINOL 100 MG PO TABS
100.0000 mg | ORAL_TABLET | Freq: Every day | ORAL | Status: DC
  Administered 2023-10-29 – 2023-10-30 (×2): 100 mg via ORAL
  Filled 2023-10-28 (×2): qty 1

## 2023-10-28 MED ORDER — ONDANSETRON HCL 4 MG/2ML IJ SOLN
4.0000 mg | Freq: Once | INTRAMUSCULAR | Status: AC
  Administered 2023-10-28: 4 mg via INTRAVENOUS
  Filled 2023-10-28: qty 2

## 2023-10-28 MED ORDER — METHOCARBAMOL 500 MG PO TABS: 750.0000 mg | ORAL_TABLET | Freq: Two times a day (BID) | ORAL | Status: DC | PRN

## 2023-10-28 MED ORDER — DEXTROSE-SODIUM CHLORIDE 5-0.45 % IV SOLN: INTRAVENOUS | Status: DC

## 2023-10-28 MED ORDER — ACETAMINOPHEN 650 MG RE SUPP: 650.0000 mg | Freq: Four times a day (QID) | RECTAL | Status: DC | PRN

## 2023-10-28 MED ORDER — CARVEDILOL 12.5 MG PO TABS
37.5000 mg | ORAL_TABLET | Freq: Two times a day (BID) | ORAL | Status: DC
  Administered 2023-10-29 – 2023-10-30 (×3): 37.5 mg via ORAL
  Filled 2023-10-28 (×3): qty 3

## 2023-10-28 MED ORDER — ACETAMINOPHEN 325 MG PO TABS: 650.0000 mg | ORAL_TABLET | Freq: Four times a day (QID) | ORAL | Status: DC | PRN

## 2023-10-28 MED ORDER — TAMSULOSIN HCL 0.4 MG PO CAPS
0.4000 mg | ORAL_CAPSULE | Freq: Every day | ORAL | Status: DC
  Administered 2023-10-29 – 2023-10-30 (×2): 0.4 mg via ORAL
  Filled 2023-10-28 (×2): qty 1

## 2023-10-28 MED ORDER — SPIRONOLACTONE 25 MG PO TABS
25.0000 mg | ORAL_TABLET | Freq: Every day | ORAL | Status: DC
  Administered 2023-10-29 – 2023-10-30 (×2): 25 mg via ORAL
  Filled 2023-10-28 (×2): qty 1

## 2023-10-28 MED ORDER — MORPHINE SULFATE (PF) 4 MG/ML IV SOLN
4.0000 mg | Freq: Once | INTRAVENOUS | Status: AC
  Administered 2023-10-28: 4 mg via INTRAVENOUS
  Filled 2023-10-28: qty 1

## 2023-10-28 MED ORDER — PANTOPRAZOLE SODIUM 40 MG PO TBEC
40.0000 mg | DELAYED_RELEASE_TABLET | Freq: Every day | ORAL | Status: DC
  Administered 2023-10-29 – 2023-10-30 (×2): 40 mg via ORAL
  Filled 2023-10-28 (×2): qty 1

## 2023-10-28 MED ORDER — DOXYCYCLINE HYCLATE 100 MG PO TABS
100.0000 mg | ORAL_TABLET | Freq: Two times a day (BID) | ORAL | Status: DC
  Administered 2023-10-28 – 2023-10-30 (×4): 100 mg via ORAL
  Filled 2023-10-28 (×4): qty 1

## 2023-10-28 MED ORDER — HYDRALAZINE HCL 20 MG/ML IJ SOLN: 5.0000 mg | INTRAMUSCULAR | Status: DC | PRN

## 2023-10-28 MED ORDER — AMLODIPINE BESYLATE 5 MG PO TABS
10.0000 mg | ORAL_TABLET | Freq: Every day | ORAL | Status: DC
  Administered 2023-10-29 – 2023-10-30 (×2): 10 mg via ORAL
  Filled 2023-10-28 (×2): qty 2

## 2023-10-28 MED ORDER — BUDESONIDE 0.5 MG/2ML IN SUSP
0.5000 mg | Freq: Two times a day (BID) | RESPIRATORY_TRACT | Status: DC
  Administered 2023-10-28 – 2023-10-30 (×4): 0.5 mg via RESPIRATORY_TRACT
  Filled 2023-10-28 (×4): qty 2

## 2023-10-28 MED ORDER — DEXTROSE 50 % IV SOLN
1.0000 | Freq: Once | INTRAVENOUS | Status: AC
  Administered 2023-10-28: 50 mL via INTRAVENOUS
  Filled 2023-10-28: qty 50

## 2023-10-28 MED ORDER — VITAMIN D (ERGOCALCIFEROL) 1.25 MG (50000 UNIT) PO CAPS: 50000.0000 [IU] | ORAL_CAPSULE | ORAL | Status: DC

## 2023-10-28 MED ORDER — IPRATROPIUM-ALBUTEROL 0.5-2.5 (3) MG/3ML IN SOLN: 3.0000 mL | Freq: Four times a day (QID) | RESPIRATORY_TRACT | Status: DC | PRN

## 2023-10-28 MED ORDER — AMITRIPTYLINE HCL 25 MG PO TABS
50.0000 mg | ORAL_TABLET | Freq: Two times a day (BID) | ORAL | Status: DC
  Administered 2023-10-28 – 2023-10-30 (×4): 50 mg via ORAL
  Filled 2023-10-28 (×4): qty 2

## 2023-10-28 MED ORDER — AZITHROMYCIN 250 MG PO TABS
500.0000 mg | ORAL_TABLET | ORAL | Status: DC
  Administered 2023-10-30: 500 mg via ORAL
  Filled 2023-10-28: qty 2

## 2023-10-28 MED ORDER — DEXTROSE 50 % IV SOLN
1.0000 | Freq: Once | INTRAVENOUS | Status: DC | PRN
Start: 2023-10-28 — End: 2023-10-30

## 2023-10-28 MED ORDER — ARFORMOTEROL TARTRATE 15 MCG/2ML IN NEBU
15.0000 ug | INHALATION_SOLUTION | Freq: Two times a day (BID) | RESPIRATORY_TRACT | Status: DC
  Administered 2023-10-28 – 2023-10-30 (×4): 15 ug via RESPIRATORY_TRACT
  Filled 2023-10-28 (×4): qty 2

## 2023-10-28 MED ORDER — GABAPENTIN 100 MG PO CAPS
100.0000 mg | ORAL_CAPSULE | Freq: Three times a day (TID) | ORAL | Status: DC
  Administered 2023-10-28 – 2023-10-30 (×5): 100 mg via ORAL
  Filled 2023-10-28 (×5): qty 1

## 2023-10-28 MED ORDER — ATORVASTATIN CALCIUM 40 MG PO TABS
40.0000 mg | ORAL_TABLET | Freq: Every day | ORAL | Status: DC
  Administered 2023-10-28 – 2023-10-29 (×2): 40 mg via ORAL
  Filled 2023-10-28 (×2): qty 1

## 2023-10-28 MED ORDER — TIOTROPIUM BROMIDE MONOHYDRATE 2.5 MCG/ACT IN AERS: 2.0000 | INHALATION_SPRAY | Freq: Every day | RESPIRATORY_TRACT | Status: DC

## 2023-10-28 NOTE — ED Provider Notes (Signed)
 [place] EMERGENCY DEPARTMENT AT [facility] Provider Note   CSN: [id number] Arrival date & time: 10/28/23  1522     History  Chief Complaint  Patient presents with   Abdominal Pain    Roger Lowe. is a 78 y.o. male.  Patient is a 78 year old male who presents to the emergency department with a chief complaint of lower abdominal pain and discomfort which has been ongoing for approximate the past week.  Patient notes that he has had minimal urine output.  He denies any associated nausea, vomiting, diarrhea.  He notes he did have some chest pain or shortness of breath earlier today.  He denies any associated dizziness, lightheadedness or syncope.  He denies any other cold symptoms include cough, congestion, rhinorrhea, sore throat.   Abdominal Pain Associated symptoms: dysuria        Home Medications Prior to Admission medications   Medication Sig Start Date End Date Taking? Authorizing Provider  albuterol (VENTOLIN HFA) 108 (90 Base) MCG/ACT inhaler Inhale 1-2 puffs into the lungs every 6 (six) hours as needed for shortness of breath or wheezing. 01/20/22   Icard, Rachel Bo, DO  allopurinol (ZYLOPRIM) 100 MG tablet Take 100 mg by mouth daily.    [provider]  ALPRAZolam Prudy Feeler) 0.5 MG tablet Take 0.5 mg by mouth 2 (two) times daily as needed for anxiety. 07/29/23   [provider]  amitriptyline (ELAVIL) 50 MG tablet Take 50 mg by mouth 2 (two) times daily.     [provider]  amLODipine (NORVASC) 10 MG tablet Take 10 mg by mouth daily.    [provider]  arformoterol (BROVANA) 15 MCG/2ML NEBU USE 1 VIAL  IN  NEBULIZER TWICE  DAILY - Morning And Evening 10/28/21   Glenford Bayley, NP  atorvastatin (LIPITOR) 40 MG tablet Take 40 mg by mouth at bedtime. 09/12/20   [provider]  azithromycin (ZITHROMAX) 500 MG tablet TAKE 1 TABLET BY MOUTH ON MONDAY, [facility] AND FRIDAY Patient not taking: Reported on 10/26/2023  10/16/23   Chilton Greathouse, MD  budesonide (PULMICORT) 0.5 MG/2ML nebulizer solution USE 1 VIAL  IN  NEBULIZER TWICE  DAILY - Rinse Mouth After Treatment 10/28/21   Glenford Bayley, NP  carvedilol (COREG) 12.5 MG tablet Take 3 tablets (37.5 mg total) by mouth 2 (two) times daily with a meal. 10/22/23   Deanna Artis, DO  doxycycline (VIBRA-TABS) 100 MG tablet Take 100 mg by mouth 2 (two) times daily. 05/11/23   [provider]  ergocalciferol (VITAMIN D2) 1.25 MG (50000 UT) capsule Take 50,000 Units by mouth every Tuesday.    [provider]  FARXIGA 10 MG TABS tablet Take 10 mg by mouth daily. 10/09/21   [provider]  furosemide (LASIX) 40 MG tablet Take 40 mg by mouth daily.    [provider]  gabapentin (NEURONTIN) 100 MG capsule Take 100 mg by mouth 3 (three) times daily.  10/28/19   [provider]  hydrALAZINE (APRESOLINE) 50 MG tablet Take 1.5 tablets (75 mg total) by mouth 3 (three) times daily. 01/15/23 10/19/23  Antoine Poche, MD  insulin glargine-yfgn (SEMGLEE) 100 UNIT/ML injection Inject 0.3 mLs (30 Units total) into the skin 2 (two) times daily. 10/22/23   Deanna Artis, DO  ipratropium-albuterol (DUONEB) 0.5-2.5 (3) MG/3ML SOLN USE 1 VIAL IN NEBULIZER EVERY 6 HOURS - And As Needed (For Rescue -MAX 30 DOSES PER MONTH) Patient taking differently: Take 3 mLs by  nebulization every 6 (six) hours as needed (Asthma). USE 1 VIAL IN NEBULIZER EVERY 6 HOURS As Needed (For Rescue -MAX 30 DOSES PER MONTH) 12/23/21   Glenford Bayley, NP  methocarbamol (ROBAXIN) 750 MG tablet Take 750 mg by mouth 2 (two) times daily as needed for muscle spasms. 03/27/21   [provider]  methylPREDNISolone (MEDROL) 4 MG tablet Take 8 mg by mouth daily as needed (pain in back). 01/29/22   [provider]  nitroGLYCERIN (NITROSTAT) 0.4 MG SL tablet PLACE 1 TABLET UNDER THE TONGUE EVERY 5 (FIVE) MINUTES AS NEEDED FOR CHEST PAIN  AS DIRECTED 08/06/20    Strader, Lennart Pall, PA-C  NOVOLOG FLEXPEN 100 UNIT/ML FlexPen Inject 25-40 Units into the skin 3 (three) times daily with meals. Sliding scale 02/21/20   [provider]  OVER THE COUNTER MEDICATION Compression vest     [provider]  oxyCODONE-acetaminophen (PERCOCET) 10-325 MG tablet Take 1 tablet by mouth every 6 (six) hours as needed for pain.    [provider]  OXYGEN Inhale 2-4 L into the lungs continuous.    [provider]  pantoprazole (PROTONIX) 40 MG tablet Take 40 mg by mouth in the morning and at bedtime.    [provider]  RELION PEN NEEDLES 31G X 6 MM MISC USE 1 PEN NEEDLE 4 TIMES DAILY 08/12/19   [provider]  rivaroxaban (XARELTO) 20 MG TABS tablet Take 1 tablet (20 mg total) by mouth every morning. 02/03/20   Antoine Poche, MD  sodium chloride (OCEAN) 0.65 % SOLN nasal spray Place 1 spray into both nostrils as needed for congestion.    [provider]  SPIRIVA RESPIMAT 2.5 MCG/ACT AERS INHALE 2 PUFFS INTO THE LUNGS DAILY. 06/02/23   Josephine Igo, DO  spironolactone (ALDACTONE) 25 MG tablet Take 1 tablet (25 mg total) by mouth daily. 10/23/23   Deanna Artis, DO  tamsulosin (FLOMAX) 0.4 MG CAPS capsule Take 1 capsule (0.4 mg total) by mouth daily. 02/24/23   Donnita Falls, FNP      Allergies    Entresto [sacubitril-valsartan] and Ace inhibitors    Review of Systems   Review of Systems  Gastrointestinal:  Positive for abdominal pain.  Genitourinary:  Positive for decreased urine volume and dysuria.    Physical Exam Updated Vital Signs BP 138/77 (BP Location: Right Arm)   Pulse 88   Temp (!) 97.3 F (36.3 C) (Oral)   Resp (!) 21   Ht 6' (1.829 m)   Wt 88.7 kg   SpO2 96%   BMI 26.52 kg/m  Physical Exam Vitals reviewed.  Constitutional:      Appearance: Normal appearance. He is diaphoretic.  HENT:     Head: Normocephalic and atraumatic.     Nose: Nose normal.     Mouth/Throat:      Mouth: Mucous membranes are moist.  Eyes:     Extraocular Movements: Extraocular movements intact.     Conjunctiva/sclera: Conjunctivae normal.     Pupils: Pupils are equal, round, and reactive to light.  Cardiovascular:     Rate and Rhythm: Normal rate and regular rhythm.     Pulses: Normal pulses.     Heart sounds: Normal heart sounds.  Pulmonary:     Effort: Pulmonary effort is normal. No respiratory distress.     Breath sounds: Normal breath sounds. No wheezing.  Abdominal:     General: Abdomen is flat. Bowel sounds are normal.  Palpations: Abdomen is soft. There is no mass or pulsatile mass.     Hernia: No hernia is present.     Comments: Tenderness palpation to suprapubic region  Musculoskeletal:        General: Normal range of motion.     Cervical back: Normal range of motion and neck supple.  Skin:    General: Skin is warm.  Neurological:     General: No focal deficit present.     Mental Status: He is alert and oriented to person, place, and time. Mental status is at baseline.  Psychiatric:        Mood and Affect: Mood normal.        Behavior: Behavior normal.        Thought Content: Thought content normal.        Judgment: Judgment normal.     ED Results / Procedures / Treatments   Labs (all labs ordered are listed, but only abnormal results are displayed) Labs Reviewed  CULTURE, BLOOD (ROUTINE X 2)  CULTURE, BLOOD (ROUTINE X 2)  COMPREHENSIVE METABOLIC PANEL  CBC WITH DIFFERENTIAL/PLATELET  URINALYSIS, W/ REFLEX TO CULTURE (INFECTION SUSPECTED)  LACTIC ACID, PLASMA  LACTIC ACID, PLASMA  TROPONIN I (HIGH SENSITIVITY)    EKG None  Radiology No results found.  Procedures Procedures    Medications Ordered in ED Medications  morphine (PF) 4 MG/ML injection 4 mg (has no administration in time range)  ondansetron (ZOFRAN) injection 4 mg (has no administration in time range)    ED Course/ Medical Decision Making/ A&P                                  Medical Decision Making Amount and/or Complexity of Data Reviewed Labs: ordered. Radiology: ordered.  Risk Prescription drug management. Decision regarding hospitalization.   This patient presents to the ED for concern of lower abdominal pain, lack of appetite, this involves an extensive number of treatment options, and is a complaint that carries with it a high risk of complications and morbidity.  The differential diagnosis includes appendicitis, cholecystitis, bowel obstruction, diverticulitis, testicular torsion, pyelonephritis, kidney stone, urinary tract infection, mesenteric ischemia, hypoglycemia   Co morbidities that complicate the patient evaluation  CHF   Additional history obtained:  Additional history obtained from medical records External records from outside source obtained and reviewed including none   Lab Tests:  I Ordered, and personally interpreted labs.  The pertinent results include: Hypoglycemia, hematuria   Imaging Studies ordered:  I ordered imaging studies including chest x-ray, CT scan of abdomen and pelvis I independently visualized and interpreted imaging which showed atelectasis in the left lung base, bladder wall thickening I agree with the radiologist interpretation   Cardiac Monitoring: / EKG:  The patient was maintained on a cardiac monitor.  I personally viewed and interpreted the cardiac monitored which showed an underlying rhythm of: Atrial sensed ventricular paced rhythm, no ischemic changes, no STEMI   Consultations Obtained:  I requested consultation with the hospitalist,  and discussed lab and imaging findings as well as pertinent plan - they recommend: Admission   Problem List / ED Course / Critical interventions / Medication management  Patient does remain stable at this time.  Discussed with patient that he does have a low blood sugar in the emergency department.  We have given him D50 and started on D5 half-normal saline.   CT scan of abdomen and pelvis demonstrated  thickened bladder wall but no indication of urinary tract infection noted on urinalysis.  Patient had otherwise stable blood work at this time other than a mild AKI.  Patient has still not been tolerating p.o. intake in the emergency department and will plan for admission to the hospitalist service as I do feel that he will decompensate if we sent him home.  Patient case has been discussed with Dr. Randol Kern who has excepted at this time.  Of note patient did have a recent increase in his Lantus during a recent admission. I ordered medication including IV fluids, D50 for AKI, hypoglycemia Reevaluation of the patient after these medicines showed that the patient improved I have reviewed the patients home medicines and have made adjustments as needed   Social Determinants of Health:  None   Test / Admission - Considered:  Admission           Final Clinical Impression(s) / ED Diagnoses Final diagnoses:  None    Rx / DC Orders ED Discharge Orders     None         Kathlen Mody 10/28/23 2140    Terrilee Files, MD 10/29/23 1008
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
## 2023-10-28 NOTE — Progress Notes (Signed)
 EPIC Encounter for ICM Monitoring  Patient Name: Roger Lowe. is a 78 y.o. male Date: 10/28/2023 Primary Care Physican: Benita Stabile, MD Primary Cardiologist: Branch Electrophysiologist: Ladona Ridgel BiV Pacing: 98% 02/11/2023 Weight: 220 lbs 07/03/2023 Weight: 214 lbs 09/09/2023 Office Weight: 218 lbs         Spoke with patient and heart failure questions reviewed.  Transmission results reviewed.  Pt thinks he has UTI because he is having a lot of pain but has not followed with MD.   Also reports he has not urinated but a very small amount in several days.    Pt hospitalized 2/17-2/20 for Acute on Chronic HF.    CorVue thoracic impedance suggesting fluid levels returned to normal after being diuresed during hospitalization.   Prescribed: Furosemide 40 mg take 1 tablet daily. Spironolactone 25 mg take 1 tablet daily   Labs: 10/22/2023 Creatinine 1.24, BUN 18, Potassium 4.2, Sodium 136, GFR >60  10/21/2023 Creatinine 1.56, BUN 17, Potassium 3.6, Sodium 135, GFR 46  10/20/2023 Creatinine 1.19, BUN 12, Potassium 4.3, Sodium 134, GFR >60  10/19/2023 Creatinine 1.29, BUN 10, Potassium 4.3, Sodium 137, GFR 58 (11:46 AM) 10/19/2023 Creatinine 1.23, BUN 10, Potassium 4.1, Sodium 136, GFR >60 (9:58 AM) A complete set of results can be found in Results Review.   Recommendations:  Advised patient to call [phone] if needed due to he has not urinated in the last few days and is feeling pain that he thinks is related to UTI.   Pt is feeling weak since hospital discharge.    Follow-up plan: ICM clinic phone appointment on 11/17/2023.   91 day device clinic remote transmission 11/16/2023.     EP/Cardiology Office Visits: 12/10/2023 with Dr Wyline Mood.   Recall 09/03/2024 with Dr. Ladona Ridgel.     Copy of ICM check sent to Dr. Ladona Ridgel.   3 month ICM trend: 10/26/2023.    12-14 Month ICM trend:     Karie Soda, RN 10/28/2023 7:55 AM

## 2023-10-28 NOTE — ED Triage Notes (Addendum)
 EMS: Pt has pain in lower abdomen and right flank. Diaphoretic. Pt stated he feel worse than when EMS picked him up. Here on the 20th.   97.5 temp 2-4L [place] normally.  66 CBG  Pt stated he can't pee, haven't peed in over a week. Pt denies N/V. Pt is still diaphoretic and clammy. Stated he had chest pain earlier and it stopped when he got here; pain in lower abdomen radiating to groin.

## 2023-10-29 DIAGNOSIS — E11649 Type 2 diabetes mellitus with hypoglycemia without coma: Secondary | ICD-10-CM | POA: Diagnosis not present

## 2023-10-29 DIAGNOSIS — E162 Hypoglycemia, unspecified: Secondary | ICD-10-CM | POA: Diagnosis not present

## 2023-10-29 LAB — GLUCOSE, CAPILLARY
Glucose-Capillary: 161 mg/dL — ABNORMAL HIGH (ref 70–99)
Glucose-Capillary: 199 mg/dL — ABNORMAL HIGH (ref 70–99)
Glucose-Capillary: 209 mg/dL — ABNORMAL HIGH (ref 70–99)
Glucose-Capillary: 269 mg/dL — ABNORMAL HIGH (ref 70–99)

## 2023-10-29 LAB — CBG MONITORING, ED
Glucose-Capillary: 208 mg/dL — ABNORMAL HIGH (ref 70–99)
Glucose-Capillary: 153 mg/dL — ABNORMAL HIGH (ref 70–99)
Glucose-Capillary: 149 mg/dL — ABNORMAL HIGH (ref 70–99)

## 2023-10-29 MED ORDER — OXYCODONE-ACETAMINOPHEN 10-325 MG PO TABS: 1.0000 | ORAL_TABLET | Freq: Four times a day (QID) | ORAL | Status: DC | PRN

## 2023-10-29 MED ORDER — INSULIN ASPART 100 UNIT/ML IJ SOLN
0.0000 [IU] | INTRAMUSCULAR | Status: DC
  Administered 2023-10-29: 2 [IU] via SUBCUTANEOUS
  Administered 2023-10-29: 3 [IU] via SUBCUTANEOUS
  Administered 2023-10-29: 5 [IU] via SUBCUTANEOUS
  Administered 2023-10-30: 1 [IU] via SUBCUTANEOUS
  Administered 2023-10-30: 2 [IU] via SUBCUTANEOUS

## 2023-10-29 MED ORDER — OXYCODONE HCL 5 MG PO TABS
5.0000 mg | ORAL_TABLET | Freq: Four times a day (QID) | ORAL | Status: DC | PRN
  Administered 2023-10-29 – 2023-10-30 (×3): 5 mg via ORAL
  Filled 2023-10-29 (×3): qty 1

## 2023-10-29 MED ORDER — FUROSEMIDE 40 MG PO TABS
40.0000 mg | ORAL_TABLET | Freq: Every day | ORAL | Status: DC
  Administered 2023-10-29 – 2023-10-30 (×2): 40 mg via ORAL
  Filled 2023-10-29 (×2): qty 1

## 2023-10-29 MED ORDER — HYDRALAZINE HCL 25 MG PO TABS
75.0000 mg | ORAL_TABLET | Freq: Three times a day (TID) | ORAL | Status: DC
  Administered 2023-10-29 – 2023-10-30 (×4): 75 mg via ORAL
  Filled 2023-10-29 (×4): qty 3

## 2023-10-29 MED ORDER — RIVAROXABAN 20 MG PO TABS
20.0000 mg | ORAL_TABLET | Freq: Every day | ORAL | Status: DC
  Administered 2023-10-29: 20 mg via ORAL
  Filled 2023-10-29: qty 1

## 2023-10-29 MED ORDER — ALPRAZOLAM 0.5 MG PO TABS: 0.5000 mg | ORAL_TABLET | Freq: Two times a day (BID) | ORAL | Status: DC | PRN

## 2023-10-29 MED ORDER — OXYCODONE-ACETAMINOPHEN 5-325 MG PO TABS
1.0000 | ORAL_TABLET | Freq: Four times a day (QID) | ORAL | Status: DC | PRN
  Administered 2023-10-29 – 2023-10-30 (×3): 1 via ORAL
  Filled 2023-10-29 (×3): qty 1

## 2023-10-29 MED ORDER — NITROGLYCERIN 0.4 MG SL SUBL: 0.4000 mg | SUBLINGUAL_TABLET | SUBLINGUAL | Status: DC | PRN

## 2023-10-29 NOTE — ED Notes (Signed)
 Moved pt up in the bed with his help. Told him to roll for me and I would get the layers out from under him but he said "If Im getting a new bed, then I will wait till then," referring to him going upstairs.

## 2023-10-29 NOTE — Plan of Care (Signed)
  Problem: Acute Rehab PT Goals(only PT should resolve) Goal: Pt Will Go Supine/Side To Sit Outcome: Progressing Flowsheets (Taken 10/29/2023 1356) Pt will go Supine/Side to Sit:  with modified independence  Independently Goal: Patient Will Transfer Sit To/From Stand Outcome: Progressing Flowsheets (Taken 10/29/2023 1356) Patient will transfer sit to/from stand:  with modified independence  with supervision Goal: Pt Will Transfer Bed To Chair/Chair To Bed Outcome: Progressing Flowsheets (Taken 10/29/2023 1356) Pt will Transfer Bed to Chair/Chair to Bed:  with modified independence  with supervision Goal: Pt Will Ambulate Outcome: Progressing Flowsheets (Taken 10/29/2023 1356) Pt will Ambulate:  75 feet  with modified independence  with supervision  with rolling walker   1:56 PM, 10/29/23 Ocie Bob, MPT Physical Therapist with [facility] [phone] office [phone] mobile phone

## 2023-10-29 NOTE — Progress Notes (Signed)
 PROGRESS NOTE    Roger Lowe.  EAV:[id number] DOB: [birth year] DOA: 10/28/2023 PCP: Benita Stabile, MD   Brief Narrative:    Roger Lowe  is a 78 y.o. male, with medical history significant for chronic respiratory failure on 3 L, pulmonary embolism and DVT, hypertension, gout, coronary artery disease, systolic CHF AICD, COPD, diabetes mellitus.  Recent hospitalization chart 10/22/2023 for acute on chronic hypoxic respiratory failure due to CHF exacerbation.  That visit his insulin regimen has been adjusted, insulin has been changed from Lantus 15 at nightly to 30 units twice daily.  He presented to the ED with complaints of some mild abdominal pain and was noted to have recurrence of his hypoglycemia.  Upon further discussion with diabetes coordinator, it appears that he was taking his usual home dose of insulin without adjustment from last hospitalization.  He has not been using his CGM sensor at home either.  Will need more information regarding total daily insulin use over the next 24 hours prior to anticipated discharge in a.m.  Assessment & Plan:   Principal Problem:   Hypoglycemia Active Problems:   Chronic systolic heart failure (HCC)   Chronic hypoxemic respiratory failure (HCC)   Chronic pulmonary embolism (HCC)   Stage 3 severe COPD by GOLD classification (HCC)   Chronic kidney disease, stage 3a (HCC)   Coronary artery disease involving native coronary artery of native heart with angina pectoris (HCC)   Bronchiectasis (HCC)   Essential hypertension   Benign prostatic hyperplasia with lower urinary tract symptoms  Assessment and Plan:   Diabetes mellitus, type II, uncontrolled with hypoglycemia -Patient with recent hospitalization, his Lantus has been adjusted, changed from Lantus 50 units at bedtime to 30 units twice daily, currently patient still taking it 50 units at bedtime, but last time was yesterday, reports he is taking NovoLog 30 units 3 times a day before meals which  sounds excessive. -Hold all insulin currently, will monitor CBG every 2 hours, and once CBG is elevated we will start back on much lower dose Semglee 20 units twice daily, uncertain if to decrease his NovoLog to sliding scale, and this has to be significantly decreased on discharge as well. -Most recent A1C is 10.9 During recent admission, but will have to tolerate some hyperglycemia to avoid significant hypoglycemic events like this. -Appreciate diabetes coordinator recommendations to maintain on NovoLog 0-9 units every 4 hours for correction to further evaluate glucose trends prior to anticipated discharge in a.m.   Chronic hypoxic respiratory failure COPD without exacerbation -Baseline, 2 L oxygen -No active wheezing, continue with home regimen   Chronic systolic CHF -EF as low as 20 to 25% which has normalized after biventricular pacemaker placement no evidence of volume overload, continue with home medication -Hold Farxiga given hypoglycemia, but resume upon discharge    Supraventricular tachycardia -Sinew with home dose metoprolol, was recently increased to 37.5 mg twice daily   History of PE -Continue with home dose Xarelto   BPH -Continue with Flomax   UTI  -continue with doxycycline as he has been started on doxycycline for UTI treatment as an outpatient     Hypertension -Continue with home medications   Chronic bronchiectasis/chronic pulmonary MAC -Continue chronic azithromycin per patient for about ~12 years now.   Gout -continue with allopurinol    DVT prophylaxis:Xarelto Code Status: DNR Family Communication: None at bedside Disposition Plan:  Status is: Observation The patient will require care spanning > 2 midnights and should be moved to inpatient because:  Need for close inpatient monitoring.   Consultants:  None  Procedures:  None  Antimicrobials:  Anti-infectives (From admission, onward)    Start     Dose/Rate Route Frequency Ordered Stop    10/30/23 1000  azithromycin (ZITHROMAX) tablet 500 mg        500 mg Oral Every M-W-F 10/28/23 2152     10/28/23 2200  doxycycline (VIBRA-TABS) tablet 100 mg        100 mg Oral 2 times daily 10/28/23 2152         Subjective: Patient seen and evaluated today with no new acute complaints or concerns. No acute concerns or events noted overnight.  Objective: Vitals:   10/29/23 0700 10/29/23 0726 10/29/23 0856 10/29/23 0932  BP: 134/83  (!) 153/102   Pulse: 83  90   Resp: 18  20   Temp:  97.6 F (36.4 C) 97.7 F (36.5 C)   TempSrc:  Oral Oral   SpO2: 100%  100% 93%  Weight:      Height:        Intake/Output Summary (Last 24 hours) at 10/29/2023 1208 Last data filed at 10/29/2023 1141 Gross per 24 hour  Intake --  Output 200 ml  Net -200 ml   Filed Weights   10/28/23 1617  Weight: 88.7 kg    Examination:  General exam: Appears calm and comfortable  Respiratory system: Clear to auscultation. Respiratory effort normal. Cardiovascular system: S1 & S2 heard, RRR.  Gastrointestinal system: Abdomen is soft Central nervous system: Alert and awake Extremities: No edema Skin: No significant lesions noted Psychiatry: Flat affect.    Data Reviewed: I have personally reviewed following labs and imaging studies  CBC: Recent Labs  Lab 10/28/23 1707  WBC 12.6*  NEUTROABS 9.3*  HGB 12.9*  HCT 40.6  MCV 93.5  PLT 293   Basic Metabolic Panel: Recent Labs  Lab 10/28/23 1707  NA 137  K 4.0  CL 102  CO2 24  GLUCOSE 43*  BUN 20  CREATININE 1.46*  CALCIUM 9.6   GFR: Estimated Creatinine Clearance: 48 mL/min (A) (by C-G formula based on SCr of 1.46 mg/dL (H)). Liver Function Tests: Recent Labs  Lab 10/28/23 1707  AST 16  ALT 16  ALKPHOS 74  BILITOT 0.5  PROT 7.8  ALBUMIN 3.2*   No results for input(s): "LIPASE", "AMYLASE" in the last 168 hours. No results for input(s): "AMMONIA" in the last 168 hours. Coagulation Profile: No results for input(s): "INR",  "PROTIME" in the last 168 hours. Cardiac Enzymes: No results for input(s): "CKTOTAL", "CKMB", "CKMBINDEX", "TROPONINI" in the last 168 hours. BNP (last 3 results) No results for input(s): "PROBNP" in the last 8760 hours. HbA1C: No results for input(s): "HGBA1C" in the last 72 hours. CBG: Recent Labs  Lab 10/28/23 2214 10/29/23 0114 10/29/23 0536 10/29/23 0808 10/29/23 1116  GLUCAP 173* 208* 153* 149* 209*   Lipid Profile: No results for input(s): "CHOL", "HDL", "LDLCALC", "TRIG", "CHOLHDL", "LDLDIRECT" in the last 72 hours. Thyroid Function Tests: No results for input(s): "TSH", "T4TOTAL", "FREET4", "T3FREE", "THYROIDAB" in the last 72 hours. Anemia Panel: No results for input(s): "VITAMINB12", "FOLATE", "FERRITIN", "TIBC", "IRON", "RETICCTPCT" in the last 72 hours. Sepsis Labs: Recent Labs  Lab 10/28/23 1707  LATICACIDVEN 1.3    Recent Results (from the past 240 hours)  Resp panel by RT-PCR (RSV, Flu A&B, Covid) Anterior Nasal Swab     Status: None   Collection Time: 10/19/23 12:53 PM   Specimen: Anterior Nasal  Swab  Result Value Ref Range Status   SARS Coronavirus 2 by RT PCR NEGATIVE NEGATIVE Final    Comment: (NOTE) SARS-CoV-2 target nucleic acids are NOT DETECTED.  The SARS-CoV-2 RNA is generally detectable in upper respiratory specimens during the acute phase of infection. The lowest concentration of SARS-CoV-2 viral copies this assay can detect is 138 copies/mL. A negative result does not preclude SARS-Cov-2 infection and should not be used as the sole basis for treatment or other patient management decisions. A negative result may occur with  improper specimen collection/handling, submission of specimen other than nasopharyngeal swab, presence of viral mutation(s) within the areas targeted by this assay, and inadequate number of viral copies(<138 copies/mL). A negative result must be combined with clinical observations, patient history, and  epidemiological information. The expected result is Negative.  Fact Sheet for Patients:  BloggerCourse.com  Fact Sheet for Healthcare Providers:  SeriousBroker.it  This test is no t yet approved or cleared by the [place] FDA and  has been authorized for detection and/or diagnosis of SARS-CoV-2 by FDA under an Emergency Use Authorization (EUA). This EUA will remain  in effect (meaning this test can be used) for the duration of the COVID-19 declaration under Section 564(b)(1) of the Act, 21 U.S.C.section 360bbb-3(b)(1), unless the authorization is terminated  or revoked sooner.       Influenza A by PCR NEGATIVE NEGATIVE Final   Influenza B by PCR NEGATIVE NEGATIVE Final    Comment: (NOTE) The Xpert Xpress SARS-CoV-2/FLU/RSV plus assay is intended as an aid in the diagnosis of influenza from Nasopharyngeal swab specimens and should not be used as a sole basis for treatment. Nasal washings and aspirates are unacceptable for Xpert Xpress SARS-CoV-2/FLU/RSV testing.  Fact Sheet for Patients: BloggerCourse.com  Fact Sheet for Healthcare Providers: SeriousBroker.it  This test is not yet approved or cleared by the [place] FDA and has been authorized for detection and/or diagnosis of SARS-CoV-2 by FDA under an Emergency Use Authorization (EUA). This EUA will remain in effect (meaning this test can be used) for the duration of the COVID-19 declaration under Section 564(b)(1) of the Act, 21 U.S.C. section 360bbb-3(b)(1), unless the authorization is terminated or revoked.     Resp Syncytial Virus by PCR NEGATIVE NEGATIVE Final    Comment: (NOTE) Fact Sheet for Patients: BloggerCourse.com  Fact Sheet for Healthcare Providers: SeriousBroker.it  This test is not yet approved or cleared by the [place] FDA and has been  authorized for detection and/or diagnosis of SARS-CoV-2 by FDA under an Emergency Use Authorization (EUA). This EUA will remain in effect (meaning this test can be used) for the duration of the COVID-19 declaration under Section 564(b)(1) of the Act, 21 U.S.C. section 360bbb-3(b)(1), unless the authorization is terminated or revoked.  Performed at [facility], [address] [place], [place] [address]   Culture, blood (routine x 2)     Status: None (Preliminary result)   Collection Time: 10/28/23  5:07 PM   Specimen: Left Antecubital; Blood  Result Value Ref Range Status   Specimen Description   Final    LEFT ANTECUBITAL BOTTLES DRAWN AEROBIC AND ANAEROBIC   Special Requests   Final    Blood Culture results may not be optimal due to an inadequate volume of blood received in culture bottles   Culture   Final    NO GROWTH < 12 HOURS Performed at [facility], [address], [place], [place] [address]    Report Status PENDING  Incomplete  Culture, blood (routine x 2)     Status: None (Preliminary result)   Collection Time: 10/28/23  5:07 PM   Specimen: BLOOD  Result Value Ref Range Status   Specimen Description BLOOD BLOOD RIGHT HAND  Final   Special Requests   Final    BOTTLES DRAWN AEROBIC ONLY Blood Culture results may not be optimal due to an inadequate volume of blood received in culture bottles   Culture   Final    NO GROWTH < 12 HOURS Performed at [facility], [address] [place], [place] [address]    Report Status PENDING  Incomplete         Radiology Studies: DG Chest Port 1 View Result Date: 10/28/2023 CLINICAL DATA:  Dyspnea. Lower abdominal and right flank pain. Diaphoresis. EXAM: PORTABLE CHEST 1 VIEW COMPARISON:  10/19/2023 FINDINGS: Cardiac pacemaker. Shallow inspiration. Heart size and pulmonary vascularity are normal for technique. Possible infiltration in the left lung base, similar to prior study. Right lung is clear. No pleural effusion or pneumothorax.  Mediastinal contours appear intact. Degenerative changes in the spine and shoulders. IMPRESSION: Infiltration or atelectasis in the left lung base. Electronically Signed   By: Burman Nieves M.D.   On: 10/28/2023 17:45   CT ABDOMEN PELVIS WO CONTRAST Result Date: 10/28/2023 CLINICAL DATA:  Acute nonlocalized abdominal pain. Unable to urinate for over a week. Diaphoresis and clammy feeling. Chest pain and lower abdominal pain radiating to the groin. EXAM: CT ABDOMEN AND PELVIS WITHOUT CONTRAST TECHNIQUE: Multidetector CT imaging of the abdomen and pelvis was performed following the standard protocol without IV contrast. RADIATION DOSE REDUCTION: This exam was performed according to the departmental dose-optimization program which includes automated exposure control, adjustment of the mA and/or kV according to patient size and/or use of iterative reconstruction technique. COMPARISON:  PET-CT 05/23/2021. CT chest abdomen and pelvis 05/06/2021 FINDINGS: Lower chest: Motion artifact limits evaluation but there is evidence of patchy infiltration in the lung bases with probable focal consolidation in the left lung base. The focal area of consolidation is less prominent than on the previous study but could represent residual mass lesion. Probable mild bronchiectasis. Changes may indicate early bronchogenic pneumonia. Hepatobiliary: No focal liver abnormality is seen. No gallstones, gallbladder wall thickening, or biliary dilatation. Pancreas: Fatty replacement of the pancreas. No acute abnormalities. Spleen: Normal in size without focal abnormality. Adrenals/Urinary Tract: No adrenal gland nodules. Left renal cysts measuring up to 5.9 cm diameter. No change since prior study. No imaging follow-up is indicated. No hydronephrosis or hydroureter. Diffuse bladder wall thickening may be due to outlet obstruction or cystitis. Correlate with urinalysis. The bladder is not abnormally distended. Stomach/Bowel: Stomach is within  normal limits. Appendix appears normal. No evidence of bowel wall thickening, distention, or inflammatory changes. Vascular/Lymphatic: Aortic atherosclerosis. No enlarged abdominal or pelvic lymph nodes. Reproductive: Prostate gland is markedly enlarged, measuring 7.1 cm in diameter. Other: No abdominal wall hernia or abnormality. No abdominopelvic ascites. Musculoskeletal: Degenerative changes in the spine. No focal bone lesions. IMPRESSION: 1. Diffuse thickening of the bladder wall may indicate cystitis or outlet obstruction. The prostate gland is markedly enlarged. 2. Focal area of consolidation in the left lung base is smaller than on prior study possibly representing residual mass. 3. Peribronchial infiltration and bronchiectasis in the lung bases may represent early bronchopneumonia. 4. Aortic atherosclerosis. Electronically Signed   By: Burman Nieves M.D.   On: 10/28/2023 17:45        Scheduled Meds:  allopurinol  100 mg  Oral Daily   amitriptyline  50 mg Oral BID   amLODipine  10 mg Oral Daily   arformoterol  15 mcg Nebulization BID   atorvastatin  40 mg Oral QHS   [START ON 10/30/2023] azithromycin  500 mg Oral Q M,W,F   budesonide  0.5 mg Nebulization BID   carvedilol  37.5 mg Oral BID WC   doxycycline  100 mg Oral BID   furosemide  40 mg Oral Daily   gabapentin  100 mg Oral TID   hydrALAZINE  75 mg Oral TID   insulin aspart  0-9 Units Subcutaneous Q4H   pantoprazole  40 mg Oral Daily   rivaroxaban  20 mg Oral Q supper   spironolactone  25 mg Oral Daily   tamsulosin  0.4 mg Oral Daily   umeclidinium bromide  1 puff Inhalation Daily   [START ON 11/03/2023] Vitamin D (Ergocalciferol)  50,000 Units Oral Q Tue     LOS: 0 days    Time spent: 35 minutes    Olvin Rohr Hoover Brunette, DO [facility]  If 7PM-7AM, please contact night-coverage [email or web address] 10/29/2023, 12:08 PM

## 2023-10-29 NOTE — TOC CM/SW Note (Signed)
 Transition of Care [facility]) - Inpatient Brief Assessment   Patient Details  Name: Roger Lowe. MRN: [id number] Date of Birth: [birth year]  Transition of Care [facility]) CM/SW Contact:    Villa Herb, LCSWA Phone Number: 10/29/2023, 11:45 AM   Clinical Narrative: Pt from home. Pt was set up with [facility] PT/OT at most recent admission. CSW updated Victorino Dike with [facility] that pt is at hospital. Pt has home O2. TOC to follow.   Transition of Care Asessment: Insurance and Status: Insurance coverage has been reviewed Patient has primary care physician: Yes Home environment has been reviewed: From home Prior level of function:: Independent Prior/Current Home Services: No current home services Social Drivers of Health Review: SDOH reviewed no interventions necessary Readmission risk has been reviewed: Yes Transition of care needs: no transition of care needs at this time

## 2023-10-29 NOTE — Evaluation (Signed)
 Physical Therapy Evaluation Patient Details Name: Roger Lowe. MRN: [id number] DOB: 1946/05/25 Today's Date: 10/29/2023  History of Present Illness  Rose Hippler  is a 78 y.o. male, with medical history significant for chronic respiratory failure on 3 L, pulmonary embolism and DVT, hypertension, gout, coronary artery disease, systolic CHF AICD, COPD, diabetes mellitus.  Recent hospitalization chart 10/22/2023 for acute on chronic hypoxic respiratory failure due to CHF exacerbation.  That visit his insulin regimen has been adjusted, insulin has been changed from Lantus 15 at nightly to 30 units twice daily.  -She presents to ED secondary to complaints of abdominal pain, he is with known history of BPH, UTI treated with doxycycline, denies any nausea, vomiting or diarrhea, for some dyspnea at baseline, in ED he was noted to be lightheaded, diaphoretic, his blood glucose was noted to be at 43, patient reports he is taking NovoLog 30 units 3 times a day, and still on his previous Lantus dose 50 units at bedtime, but did not take his Lantus today, he reports poor appetite.   Clinical Impression  Patient demonstrates fair/good return for transferring to chair and to/from commode in bathroom, ambulated in hallway using RW without loss of balance while on 3 LPM with SpO2 at 95% and tolerated sitting up in chair after therapy.  Patient will benefit from continued skilled physical therapy in hospital and recommended venue below to increase strength, balance, endurance for safe ADLs and gait.         If plan is discharge home, recommend the following: A little help with walking and/or transfers;A little help with bathing/dressing/bathroom;Help with stairs or ramp for entrance;Assistance with cooking/housework   Can travel by private vehicle        Equipment Recommendations None recommended by PT  Recommendations for Other Services       Functional Status Assessment Patient has had a recent decline in their  functional status and demonstrates the ability to make significant improvements in function in a reasonable and predictable amount of time.     Precautions / Restrictions Precautions Precautions: Fall Restrictions Weight Bearing Restrictions Per Provider Order: No      Mobility  Bed Mobility Overal bed mobility: Needs Assistance Bed Mobility: Supine to Sit     Supine to sit: Supervision     General bed mobility comments: slightly labored movement for completing supine to sitting    Transfers Overall transfer level: Needs assistance Equipment used: Rolling walker (2 wheels) Transfers: Sit to/from Stand, Bed to chair/wheelchair/BSC Sit to Stand: Supervision, Contact guard assist   Step pivot transfers: Supervision, Contact guard assist       General transfer comment: good carryover for transferring to chair and commode in bathroom using RW    Ambulation/Gait Ambulation/Gait assistance: Supervision, Contact guard assist Gait Distance (Feet): 45 Feet Assistive device: Rolling walker (2 wheels) Gait Pattern/deviations: Decreased step length - right, Decreased step length - left, Decreased stride length Gait velocity: decreased     General Gait Details: slow labored movement without loss of balance, limited mostly due to fatigue, on 3 LPM O2 with SpO2 at 95%  Stairs            Wheelchair Mobility     Tilt Bed    Modified Rankin (Stroke Patients Only)       Balance Overall balance assessment: Needs assistance Sitting-balance support: Feet supported, No upper extremity supported Sitting balance-Leahy Scale: Good Sitting balance - Comments: seated at EOB   Standing balance support: Reliant on assistive  device for balance, During functional activity, Bilateral upper extremity supported Standing balance-Leahy Scale: Fair Standing balance comment: fair/good using RW                             Pertinent Vitals/Pain Pain Assessment Pain  Assessment: 0-10 Pain Score: 8  Pain Location: low back, BLE Pain Descriptors / Indicators: Aching, Sore, Discomfort Pain Intervention(s): Limited activity within patient's tolerance, Monitored during session, Repositioned    Home Living Family/patient expects to be discharged to:: Private residence Living Arrangements: Other relatives;Other (Comment) Available Help at Discharge: Family Type of Home: Apartment Home Access: Elevator   Entrance Stairs-Number of Steps: flight of steps up to apartment but uses elevator   Home Layout: One level Home Equipment: Agricultural consultant (2 wheels);Rollator (4 wheels);Electric scooter;Wheelchair - power;Shower seat;Grab bars - tub/shower Additional Comments: RW in home and sometimes WC; scooter and WC for longer distances outside of home    Prior Function Prior Level of Function : Needs assist       Physical Assist : Mobility (physical);ADLs (physical) Mobility (physical): Bed mobility;Transfers;Gait;Stairs   Mobility Comments: Household ambulation using RW, uses power w/c or electric scooter for longer distances, does not drive ADLs Comments: Independent for household ADLs, assisted by brother for community ADLs     Extremity/Trunk Assessment   Upper Extremity Assessment Upper Extremity Assessment: Overall WFL for tasks assessed    Lower Extremity Assessment Lower Extremity Assessment: Generalized weakness    Cervical / Trunk Assessment Cervical / Trunk Assessment: Normal  Communication   Communication Communication: No apparent difficulties    Cognition Arousal: Alert     PT - Cognitive impairments: No apparent impairments                         Following commands: Intact       Cueing Cueing Techniques: Verbal cues     General Comments      Exercises     Assessment/Plan    PT Assessment Patient needs continued PT services  PT Problem List Decreased strength;Decreased activity tolerance;Decreased  balance;Decreased mobility;Pain       PT Treatment Interventions Gait training;Functional mobility training;Therapeutic activities;Therapeutic exercise;Balance training;Patient/family education;DME instruction    PT Goals (Current goals can be found in the Care Plan section)  Acute Rehab PT Goals Patient Stated Goal: return home with family assist PT Goal Formulation: With patient Time For Goal Achievement: 11/02/23 Potential to Achieve Goals: Good    Frequency Min 3X/week     Co-evaluation               AM-PAC PT "6 Clicks" Mobility  Outcome Measure Help needed turning from your back to your side while in a flat bed without using bedrails?: None Help needed moving from lying on your back to sitting on the side of a flat bed without using bedrails?: None Help needed moving to and from a bed to a chair (including a wheelchair)?: A Little Help needed standing up from a chair using your arms (e.g., wheelchair or bedside chair)?: A Little Help needed to walk in hospital room?: A Little Help needed climbing 3-5 steps with a railing? : A Lot 6 Click Score: 19    End of Session Equipment Utilized During Treatment: Oxygen Activity Tolerance: Patient tolerated treatment well;Patient limited by fatigue Patient left: in chair;with call bell/phone within reach Nurse Communication: Mobility status PT Visit Diagnosis: Unsteadiness on feet (R26.81);Other abnormalities  of gait and mobility (R26.89);Muscle weakness (generalized) (M62.81)    Time: 6213-0865 PT Time Calculation (min) (ACUTE ONLY): 25 min   Charges:   PT Evaluation $PT Eval Moderate Complexity: 1 Mod PT Treatments $Therapeutic Activity: 23-37 mins PT General Charges $$ ACUTE PT VISIT: 1 Visit        1:54 PM, 10/29/23 Ocie Bob, MPT Physical Therapist with [facility] [phone] office [phone] mobile phone

## 2023-10-29 NOTE — Inpatient Diabetes Management (Addendum)
 Inpatient Diabetes Program Recommendations  AACE/ADA: New Consensus Statement on Inpatient Glycemic Control   Target Ranges:  Prepandial:   less than 140 mg/dL      Peak postprandial:   less than 180 mg/dL (1-2 hours)      Critically ill patients:  140 - 180 mg/dL    Latest Reference Range & Units 10/28/23 20:18 10/28/23 22:14 10/29/23 01:14 10/29/23 05:36  Glucose-Capillary 70 - 99 mg/dL 161 (H) 096 (H) 045 (H) 153 (H)    Latest Reference Range & Units 10/28/23 17:07  Glucose 70 - 99 mg/dL 43 (LL)   Review of Glycemic Control  Diabetes history: DM2 Outpatient Diabetes medications: Levemir 50 units at bedtime, Novolog 30 units TID with meals, Farxiga 10 mg daily Current orders for Inpatient glycemic control: None; CBGs Q2H, D51/2NS @ 75 ml/hr  Inpatient Diabetes Program Recommendations:    Insulin: Please consider ordering Novolog 0-9 units Q4H.  NOTE: Patient admitted with hypoglycemia, COPD, UTI, and SVT. Per chart review patient was recently inpatient 10/19/23-10/22/23 and insulin regimen was changed from Levemir 50 units at bedtime and Novolog 25-40 units TID to Semglee 30 units BID and Novolog 25-40 units TID.  A1C was 10.9% on 10/19/23 and inpatient diabetes coordinator spoke with patient on 10/20/23. Patient's initial lab glucose was 43 mg/dl on 12/09/79 and per H&P patient reported he was still taking Lantus 50 units at bedtime and Novolog 30 units TID with meals; last took Lantus 50 units on 10/27/23.  Will plan to follow up with patient today.  Addendum 10/29/23@12 :00-Spoke with patient at bedside; patient sitting up in the chair. Patient states he was taking Levemir 50 units at bedtime (still has Levemir insulin at home he has been using), Novolog 30 units TID with meals, and Farxiga 10 mg daily for DM control. Patient states that he knows he was told to take insulin differently after discharge on 10/22/23 but he didn't because it felt his prior insulin regimen worked and "no sense in  changing something if it works".  Patient reports he is doing finger sticks TID and glucose is usually in the 100's but it goes low at times but not typically as low as it did this time. Patient states that he gets symptoms when glucose is low and he can usually treat it without any issues.  Patient reports he is eating 3 meals a day and that he eats soups and frozen healthy meals usually. Discussed that given recent hypoglycemia noted during last hospitalization and on admission this time, he will need to decrease his insulin dosages. Inquired about using CGM and patient states he has CGM sensors at home but he does not use them "has a hard time moving into the future".  Discussed how CGM works and discussed safety features with the alarms (for hypoglycemia if glucose less than 70 mg/dl and hyperglycemia if glucose is over 250 mg/dl). Also discussed how his PCP can use the information to continue to make adjustments with insulin. Patient states that he was not aware of the alarms on the CGM and he reports that he will start using the CMG sensors. Patient states he would like to stay the night to ensure glucose remains stable and he will plan to follow the directions the doctor gives him at discharge regarding his insulin. Patient states he has a follow up visit with Dr. Margo Aye on Monday March 3rd. Patient reports that he lives with his brother and he is able to help him at home when needed.  Encouraged patient to get started on the CGM sensors as soon as he gets home and be sure to follow the discharge instructions regarding insulin regimen. Patient verbalized understanding and has no questions at this time.  Thanks, Roger Penner, RN, MSN, [facility] Diabetes Coordinator Inpatient Diabetes Program [phone] (Team Pager from 8am to 5pm)

## 2023-10-29 NOTE — Care Management Obs Status (Signed)
 MEDICARE OBSERVATION STATUS NOTIFICATION   Patient Details  Name: Roger Lowe. MRN: [id number] Date of Birth: 1945/09/08   Medicare Observation Status Notification Given:  Yes    Corey Harold 10/29/2023, 11:26 AM

## 2023-10-30 DIAGNOSIS — E162 Hypoglycemia, unspecified: Secondary | ICD-10-CM | POA: Diagnosis not present

## 2023-10-30 DIAGNOSIS — E11649 Type 2 diabetes mellitus with hypoglycemia without coma: Secondary | ICD-10-CM | POA: Diagnosis not present

## 2023-10-30 LAB — BASIC METABOLIC PANEL
Anion gap: 5 (ref 5–15)
BUN: 18 mg/dL (ref 8–23)
CO2: 26 mmol/L (ref 22–32)
Calcium: 9.2 mg/dL (ref 8.9–10.3)
Chloride: 103 mmol/L (ref 98–111)
Creatinine, Ser: 1.34 mg/dL — ABNORMAL HIGH (ref 0.61–1.24)
GFR, Estimated: 55 mL/min — ABNORMAL LOW (ref >60)
Glucose, Bld: 120 mg/dL — ABNORMAL HIGH (ref 70–99)
Potassium: 4.8 mmol/L (ref 3.5–5.1)
Sodium: 134 mmol/L — ABNORMAL LOW (ref 135–145)

## 2023-10-30 LAB — GLUCOSE, CAPILLARY
Glucose-Capillary: 107 mg/dL — ABNORMAL HIGH (ref 70–99)
Glucose-Capillary: 150 mg/dL — ABNORMAL HIGH (ref 70–99)
Glucose-Capillary: 200 mg/dL — ABNORMAL HIGH (ref 70–99)

## 2023-10-30 LAB — CBC
HCT: 42.2 % (ref 39.0–52.0)
Hemoglobin: 13.3 g/dL (ref 13.0–17.0)
MCH: 29.9 pg (ref 26.0–34.0)
MCHC: 31.5 g/dL (ref 30.0–36.0)
MCV: 94.8 fL (ref 80.0–100.0)
Platelets: 245 10*3/uL (ref 150–400)
RBC: 4.45 MIL/uL (ref 4.22–5.81)
RDW: 14.3 % (ref 11.5–15.5)
WBC: 8.1 10*3/uL (ref 4.0–10.5)
nRBC: 0 % (ref 0.0–0.2)

## 2023-10-30 LAB — MAGNESIUM: Magnesium: 1.9 mg/dL (ref 1.7–2.4)

## 2023-10-30 MED ORDER — LANTUS 100 UNIT/ML ~~LOC~~ SOLN: 15.0000 [IU] | Freq: Every day | SUBCUTANEOUS | 11 refills | Status: AC

## 2023-10-30 MED ORDER — NOVOLOG FLEXPEN 100 UNIT/ML ~~LOC~~ SOPN: 5.0000 [IU] | PEN_INJECTOR | Freq: Three times a day (TID) | SUBCUTANEOUS | 11 refills | Status: DC

## 2023-10-30 NOTE — TOC Transition Note (Signed)
 Transition of Care [facility]) - Discharge Note   Patient Details  Name: Roger Lowe. MRN: [id number] Date of Birth: [birth year]  Transition of Care [facility]) CM/SW Contact:  Elliot Gault, LCSW Phone Number: 10/30/2023, 12:17 PM   Clinical Narrative:     Pt stable for dc per MD. Aundra Dubin at [facility]. They will continue to follow pt at home.  Final next level of care: Home w Home Health Services Barriers to Discharge: Barriers Resolved   Patient Goals and CMS Choice            Discharge Placement                       Discharge Plan and Services Additional resources added to the After Visit Summary for                                       Social Drivers of Health (SDOH) Interventions SDOH Screenings   Food Insecurity: No Food Insecurity (10/29/2023)  Housing: Low Risk  (10/29/2023)  Transportation Needs: No Transportation Needs (10/29/2023)  Utilities: Not At Risk (10/29/2023)  Depression (PHQ2-9): Low Risk  (05/23/2021)  Recent Concern: Depression (PHQ2-9) - Medium Risk (02/25/2021)  Social Connections: Unknown (10/29/2023)  Tobacco Use: Medium Risk (10/28/2023)     Readmission Risk Interventions    10/22/2023   12:24 PM  Readmission Risk Prevention Plan  Transportation Screening Complete  Home Care Screening Complete  Medication Review (RN CM) Complete

## 2023-10-30 NOTE — Plan of Care (Signed)

## 2023-10-30 NOTE — Progress Notes (Signed)
 Patient states that he would like to have Urology consulted during this admission due to the abdominal pain and feeling of "not completely emptying his bladder" during voiding.

## 2023-10-30 NOTE — Discharge Summary (Signed)
 Physician Discharge Summary  Roger Lowe. UJW:[id number] DOB: 08/27/1946 DOA: 10/28/2023  PCP: Roger Stabile, MD  Admit date: 10/28/2023  Discharge date: 10/30/2023  Admitted From:Home  Disposition:  Home  Recommendations for Outpatient Follow-up:  Follow up with PCP in 2-3 days with follow-up appointment scheduled on Monday Monitor blood glucose readings carefully with CGM at home and follow-up with PCP regarding further adjustments to insulin Decrease Lantus dosing to 15 units nightly and 5 units of NovoLog with meals, hopefully patient will remain compliant Continue other home medications as prior  Home Health: Yes with PT/OT  Equipment/Devices: Has home nasal cannula oxygen 3 L  Discharge Condition:Stable  CODE STATUS: DNR  Diet recommendation: Heart Healthy/carb modified  Brief/Interim Summary:  Roger Lowe  is a 78 y.o. male, with medical history significant for chronic respiratory failure on 3 L, pulmonary embolism and DVT, hypertension, gout, coronary artery disease, systolic CHF AICD, COPD, diabetes mellitus.  Recent hospitalization chart 10/22/2023 for acute on chronic hypoxic respiratory failure due to CHF exacerbation.  That visit his insulin regimen has been adjusted, insulin has been changed from Lantus 15 at nightly to 30 units twice daily.  He presented to the ED with complaints of some mild abdominal pain and was noted to have recurrence of his hypoglycemia.  Upon further discussion with diabetes coordinator, it appears that he was taking his usual home dose of insulin without adjustment from last hospitalization.  He has not been using his CGM sensor at home either.  Patient has been encouraged to remain compliant with his CGM and monitor blood glucose carefully at home and it has been reiterated that his insulin dosages need to be decreased.  Adjustments were made as noted above and below.  No other acute events or concerns noted.  He has been seen by physical therapy with  recommendations for home health services which have been arranged.  He remains a high risk for readmission if he decides not to comply with his home regimen or close monitoring.  Discharge Diagnoses:  Principal Problem:   Hypoglycemia Active Problems:   Chronic systolic heart failure (HCC)   Chronic hypoxemic respiratory failure (HCC)   Chronic pulmonary embolism (HCC)   Stage 3 severe COPD by GOLD classification (HCC)   Chronic kidney disease, stage 3a (HCC)   Coronary artery disease involving native coronary artery of native heart with angina pectoris (HCC)   Bronchiectasis (HCC)   Essential hypertension   Benign prostatic hyperplasia with lower urinary tract symptoms  Principal discharge diagnosis: Type 2 diabetes-insulin-dependent uncontrolled with recurrence of hypoglycemia.  Discharge Instructions  Discharge Instructions     Diet - low sodium heart healthy   Complete by: As directed    Increase activity slowly   Complete by: As directed       Allergies as of 10/30/2023       Reactions   Entresto [sacubitril-valsartan] Swelling   Ace Inhibitors Swelling        Medication List     TAKE these medications    albuterol 108 (90 Base) MCG/ACT inhaler Commonly known as: VENTOLIN HFA Inhale 1-2 puffs into the lungs every 6 (six) hours as needed for shortness of breath or wheezing.   allopurinol 100 MG tablet Commonly known as: ZYLOPRIM Take 100 mg by mouth daily.   ALPRAZolam 0.5 MG tablet Commonly known as: XANAX Take 0.5 mg by mouth 2 (two) times daily as needed for anxiety.   amitriptyline 50 MG tablet Commonly known as: ELAVIL Take  50 mg by mouth 2 (two) times daily.   amLODipine 10 MG tablet Commonly known as: NORVASC Take 10 mg by mouth daily.   arformoterol 15 MCG/2ML Nebu Commonly known as: BROVANA USE 1 VIAL  IN  NEBULIZER TWICE  DAILY - Morning And Evening   atorvastatin 40 MG tablet Commonly known as: LIPITOR Take 40 mg by mouth at  bedtime.   azithromycin 500 MG tablet Commonly known as: ZITHROMAX TAKE 1 TABLET BY MOUTH ON MONDAY, WEDNESDAY AND FRIDAY   budesonide 0.5 MG/2ML nebulizer solution Commonly known as: PULMICORT USE 1 VIAL  IN  NEBULIZER TWICE  DAILY - Rinse Mouth After Treatment   carvedilol 12.5 MG tablet Commonly known as: COREG Take 3 tablets (37.5 mg total) by mouth 2 (two) times daily with a meal.   doxycycline 100 MG tablet Commonly known as: VIBRA-TABS Take 100 mg by mouth 2 (two) times daily.   ergocalciferol 1.25 MG (50000 UT) capsule Commonly known as: VITAMIN D2 Take 50,000 Units by mouth every Tuesday.   Farxiga 10 MG Tabs tablet Generic drug: dapagliflozin propanediol Take 10 mg by mouth daily.   furosemide 40 MG tablet Commonly known as: LASIX Take 40 mg by mouth daily.   gabapentin 100 MG capsule Commonly known as: NEURONTIN Take 100 mg by mouth 3 (three) times daily.   hydrALAZINE 50 MG tablet Commonly known as: APRESOLINE Take 1.5 tablets (75 mg total) by mouth 3 (three) times daily.   ipratropium-albuterol 0.5-2.5 (3) MG/3ML Soln Commonly known as: DUONEB USE 1 VIAL IN NEBULIZER EVERY 6 HOURS - And As Needed (For Rescue -MAX 30 DOSES PER MONTH) What changed: See the new instructions.   Lantus 100 UNIT/ML injection Generic drug: insulin glargine Inject 0.15 mLs (15 Units total) into the skin at bedtime. What changed: how much to take   methocarbamol 750 MG tablet Commonly known as: ROBAXIN Take 750 mg by mouth 2 (two) times daily as needed for muscle spasms.   nitroGLYCERIN 0.4 MG SL tablet Commonly known as: NITROSTAT PLACE 1 TABLET UNDER THE TONGUE EVERY 5 (FIVE) MINUTES AS NEEDED FOR CHEST PAIN  AS DIRECTED   NovoLOG FlexPen 100 UNIT/ML FlexPen Generic drug: insulin aspart Inject 5 Units into the skin 3 (three) times daily with meals. Sliding scale What changed: how much to take   OVER THE COUNTER MEDICATION Compression vest   oxyCODONE-acetaminophen  10-325 MG tablet Commonly known as: PERCOCET Take 1 tablet by mouth every 6 (six) hours as needed for pain.   OXYGEN Inhale 2-4 L into the lungs continuous.   pantoprazole 40 MG tablet Commonly known as: PROTONIX Take 40 mg by mouth in the morning and at bedtime.   ReliOn Pen Needles 31G X 6 MM Misc Generic drug: Insulin Pen Needle USE 1 PEN NEEDLE 4 TIMES DAILY   rivaroxaban 20 MG Tabs tablet Commonly known as: XARELTO Take 1 tablet (20 mg total) by mouth every morning.   sodium chloride 0.65 % Soln nasal spray Commonly known as: OCEAN Place 1 spray into both nostrils as needed for congestion.   Spiriva Respimat 2.5 MCG/ACT Aers Generic drug: Tiotropium Bromide Monohydrate INHALE 2 PUFFS INTO THE LUNGS DAILY.   spironolactone 25 MG tablet Commonly known as: ALDACTONE Take 1 tablet (25 mg total) by mouth daily.   tamsulosin 0.4 MG Caps capsule Commonly known as: FLOMAX Take 1 capsule (0.4 mg total) by mouth daily.        Follow-up Information     Roger Stabile,  MD. Schedule an appointment as soon as possible for a visit in 1 week(s).   Specialty: Internal Medicine Contact information: [address] [place] [address] [phone]                Allergies  Allergen Reactions   Entresto [Sacubitril-Valsartan] Swelling   Ace Inhibitors Swelling    Consultations: None   Procedures/Studies: DG Chest Port 1 View Result Date: 10/28/2023 CLINICAL DATA:  Dyspnea. Lower abdominal and right flank pain. Diaphoresis. EXAM: PORTABLE CHEST 1 VIEW COMPARISON:  10/19/2023 FINDINGS: Cardiac pacemaker. Shallow inspiration. Heart size and pulmonary vascularity are normal for technique. Possible infiltration in the left lung base, similar to prior study. Right lung is clear. No pleural effusion or pneumothorax. Mediastinal contours appear intact. Degenerative changes in the spine and shoulders. IMPRESSION: Infiltration or atelectasis in the left lung base.  Electronically Signed   By: Burman Nieves M.D.   On: 10/28/2023 17:45   CT ABDOMEN PELVIS WO CONTRAST Result Date: 10/28/2023 CLINICAL DATA:  Acute nonlocalized abdominal pain. Unable to urinate for over a week. Diaphoresis and clammy feeling. Chest pain and lower abdominal pain radiating to the groin. EXAM: CT ABDOMEN AND PELVIS WITHOUT CONTRAST TECHNIQUE: Multidetector CT imaging of the abdomen and pelvis was performed following the standard protocol without IV contrast. RADIATION DOSE REDUCTION: This exam was performed according to the departmental dose-optimization program which includes automated exposure control, adjustment of the mA and/or kV according to patient size and/or use of iterative reconstruction technique. COMPARISON:  PET-CT 05/23/2021. CT chest abdomen and pelvis 05/06/2021 FINDINGS: Lower chest: Motion artifact limits evaluation but there is evidence of patchy infiltration in the lung bases with probable focal consolidation in the left lung base. The focal area of consolidation is less prominent than on the previous study but could represent residual mass lesion. Probable mild bronchiectasis. Changes may indicate early bronchogenic pneumonia. Hepatobiliary: No focal liver abnormality is seen. No gallstones, gallbladder wall thickening, or biliary dilatation. Pancreas: Fatty replacement of the pancreas. No acute abnormalities. Spleen: Normal in size without focal abnormality. Adrenals/Urinary Tract: No adrenal gland nodules. Left renal cysts measuring up to 5.9 cm diameter. No change since prior study. No imaging follow-up is indicated. No hydronephrosis or hydroureter. Diffuse bladder wall thickening may be due to outlet obstruction or cystitis. Correlate with urinalysis. The bladder is not abnormally distended. Stomach/Bowel: Stomach is within normal limits. Appendix appears normal. No evidence of bowel wall thickening, distention, or inflammatory changes. Vascular/Lymphatic: Aortic  atherosclerosis. No enlarged abdominal or pelvic lymph nodes. Reproductive: Prostate gland is markedly enlarged, measuring 7.1 cm in diameter. Other: No abdominal wall hernia or abnormality. No abdominopelvic ascites. Musculoskeletal: Degenerative changes in the spine. No focal bone lesions. IMPRESSION: 1. Diffuse thickening of the bladder wall may indicate cystitis or outlet obstruction. The prostate gland is markedly enlarged. 2. Focal area of consolidation in the left lung base is smaller than on prior study possibly representing residual mass. 3. Peribronchial infiltration and bronchiectasis in the lung bases may represent early bronchopneumonia. 4. Aortic atherosclerosis. Electronically Signed   By: Burman Nieves M.D.   On: 10/28/2023 17:45   ECHOCARDIOGRAM COMPLETE Result Date: 10/21/2023    ECHOCARDIOGRAM REPORT   Patient Name:   Jennifer Holland. Date of Exam: 10/21/2023 Medical Rec #:  [id number]     Height:       72.0 in Accession #:    [id number]    Weight:       192.7 lb Date of  Birth:  Oct 16, 1945     BSA:          2.097 m Patient Age:    77 years      BP:           106/71 mmHg Patient Gender: M             HR:           89 bpm. [facility] Procedure: 2D Echo (Both Spectral and Color Flow Doppler were utilized during            procedure). Indications:    Dyspnea  History:        Patient has prior history of Echocardiogram examinations, most                 recent 08/17/2020. CHF, CAD, CKD 3a and COPD, Arrythmias:LBBB;                 Risk Factors:Hypertension, Diabetes and Former Smoker.  Sonographer:    Dondra Prader RVT RCS Referring Phys: [id number] Dorothe Pea BRANCH IMPRESSIONS  1. Left ventricular ejection fraction, by estimation, is 60 to 65%. The left ventricle has normal function. The left ventricle has no regional wall motion abnormalities. There is moderate left ventricular hypertrophy of the septal segment. Left ventricular diastolic parameters are consistent with Grade I diastolic  dysfunction (impaired relaxation).  2. Right ventricular systolic function is normal. The right ventricular size is normal.  3. The mitral valve is normal in structure. No evidence of mitral valve regurgitation. No evidence of mitral stenosis.  4. The aortic valve is tricuspid. There is mild calcification of the aortic valve. Aortic valve regurgitation is not visualized. No aortic stenosis is present.  5. The inferior vena cava is normal in size with greater than 50% respiratory variability, suggesting right atrial pressure of 3 mmHg. Comparison(s): No significant change from prior study. FINDINGS  Left Ventricle: Left ventricular ejection fraction, by estimation, is 60 to 65%. The left ventricle has normal function. The left ventricle has no regional wall motion abnormalities. Strain imaging was not performed. The left ventricular internal cavity  size was normal in size. There is moderate left ventricular hypertrophy of the septal segment. Left ventricular diastolic parameters are consistent with Grade I diastolic dysfunction (impaired relaxation). Normal left ventricular filling pressure. Right Ventricle: The right ventricular size is normal. No increase in right ventricular wall thickness. Right ventricular systolic function is normal. Left Atrium: Left atrial size was normal in size. Right Atrium: Right atrial size was normal in size. Pericardium: There is no evidence of pericardial effusion. Mitral Valve: The mitral valve is normal in structure. No evidence of mitral valve regurgitation. No evidence of mitral valve stenosis. Tricuspid Valve: The tricuspid valve is normal in structure. Tricuspid valve regurgitation is trivial. No evidence of tricuspid stenosis. Aortic Valve: The aortic valve is tricuspid. There is mild calcification of the aortic valve. Aortic valve regurgitation is not visualized. No aortic stenosis is present. Aortic valve mean gradient measures 2.0 mmHg. Aortic valve peak gradient measures  3.9 mmHg. Aortic valve area, by VTI measures 2.96 cm. Pulmonic Valve: The pulmonic valve was normal in structure. Pulmonic valve regurgitation is trivial. No evidence of pulmonic stenosis. Aorta: The aortic root and ascending aorta are structurally normal, with no evidence of dilitation. Venous: The inferior vena cava is normal in size with greater than 50% respiratory variability, suggesting right atrial pressure of 3 mmHg. IAS/Shunts: The atrial septum is grossly normal. Additional  Comments: 3D imaging was not performed. A device lead is visualized.  LEFT VENTRICLE PLAX 2D LVIDd:         3.65 cm   Diastology LVIDs:         2.40 cm   LV e' medial:    7.56 cm/s LV PW:         1.15 cm   LV E/e' medial:  5.0 LV IVS:        1.25 cm   LV e' lateral:   7.29 cm/s LVOT diam:     2.10 cm   LV E/e' lateral: 5.2 LV SV:         49 LV SV Index:   23 LVOT Area:     3.46 cm  RIGHT VENTRICLE             IVC RV Basal diam:  3.40 cm     IVC diam: 1.30 cm RV S prime:     10.80 cm/s TAPSE (M-mode): 1.7 cm LEFT ATRIUM             Index        RIGHT ATRIUM           Index LA diam:        3.20 cm 1.53 cm/m   RA Area:     14.60 cm LA Vol (A2C):   25.7 ml 12.26 ml/m  RA Volume:   41.70 ml  19.88 ml/m LA Vol (A4C):   20.2 ml 9.63 ml/m LA Biplane Vol: 24.1 ml 11.49 ml/m  AORTIC VALVE                    PULMONIC VALVE AV Area (Vmax):    3.03 cm     PV Vmax:          0.57 m/s AV Area (Vmean):   2.95 cm     PV Peak grad:     1.3 mmHg AV Area (VTI):     2.96 cm     PR End Diast Vel: 10.24 msec AV Vmax:           98.20 cm/s AV Vmean:          65.600 cm/s AV VTI:            0.165 m AV Peak Grad:      3.9 mmHg AV Mean Grad:      2.0 mmHg LVOT Vmax:         85.90 cm/s LVOT Vmean:        55.800 cm/s LVOT VTI:          0.141 m LVOT/AV VTI ratio: 0.85  AORTA Ao Asc diam: 3.10 cm MITRAL VALVE               TRICUSPID VALVE MV Area (PHT): 6.83 cm    TR Peak grad:   23.4 mmHg MV Decel Time: 111 msec    TR Vmax:        242.00 cm/s MV E velocity:  37.80 cm/s MV A velocity: 76.40 cm/s  SHUNTS MV E/A ratio:  0.49        Systemic VTI:  0.14 m                            Systemic Diam: 2.10 cm Vishnu Priya Mallipeddi Electronically signed by Winfield Rast Mallipeddi Signature Date/Time: 10/21/2023/3:32:51 PM    Final    DG Chest 2 View Result  Date: 10/19/2023 CLINICAL DATA:  Chest pain EXAM: CHEST - 2 VIEW COMPARISON:  07/29/2023. FINDINGS: Bilateral lung fields are clear. Bilateral costophrenic angles are clear. Note is made of elevated right hemidiaphragm. Normal cardio-mediastinal silhouette. There is a left sided 3-lead pacemaker. No acute osseous abnormalities. The soft tissues are within normal limits. IMPRESSION: No active cardiopulmonary disease. Electronically Signed   By: Jules Schick M.D.   On: 10/19/2023 13:36     Discharge Exam: Vitals:   10/30/23 0905 10/30/23 0946  BP: (!) 150/92   Pulse:    Resp:    Temp:    SpO2:  92%   Vitals:   10/29/23 2153 10/30/23 0359 10/30/23 0905 10/30/23 0946  BP: 131/75 (!) 144/80 (!) 150/92   Pulse: 92 95    Resp: 16 16    Temp: 98.2 F (36.8 C) 97.9 F (36.6 C)    TempSrc: Oral Oral    SpO2: 100% 95%  92%  Weight:      Height:        General: Pt is alert, awake, not in acute distress Cardiovascular: RRR, S1/S2 +, no rubs, no gallops Respiratory: CTA bilaterally, no wheezing, no rhonchi, nasal cannula oxygen Abdominal: Soft, NT, ND, bowel sounds + Extremities: no edema, no cyanosis    The results of significant diagnostics from this hospitalization (including imaging, microbiology, ancillary and laboratory) are listed below for reference.     Microbiology: Recent Results (from the past 240 hours)  Culture, blood (routine x 2)     Status: None (Preliminary result)   Collection Time: 10/28/23  5:07 PM   Specimen: Left Antecubital; Blood  Result Value Ref Range Status   Specimen Description   Final    LEFT ANTECUBITAL BOTTLES DRAWN AEROBIC AND ANAEROBIC   Special Requests    Final    Blood Culture results may not be optimal due to an inadequate volume of blood received in culture bottles   Culture   Final    NO GROWTH 2 DAYS Performed at [facility], [address] [place], [place] [address]    Report Status PENDING  Incomplete  Culture, blood (routine x 2)     Status: None (Preliminary result)   Collection Time: 10/28/23  5:07 PM   Specimen: BLOOD  Result Value Ref Range Status   Specimen Description BLOOD BLOOD RIGHT HAND  Final   Special Requests   Final    BOTTLES DRAWN AEROBIC ONLY Blood Culture results may not be optimal due to an inadequate volume of blood received in culture bottles   Culture   Final    NO GROWTH 2 DAYS Performed at [facility], [address], [place], [place] [address]    Report Status PENDING  Incomplete     Labs: BNP (last 3 results) Recent Labs    10/19/23 1146  BNP 58.0   Basic Metabolic Panel: Recent Labs  Lab 10/28/23 1707 10/30/23 0507  NA 137 134*  K 4.0 4.8  CL 102 103  CO2 24 26  GLUCOSE 43* 120*  BUN 20 18  CREATININE 1.46* 1.34*  CALCIUM 9.6 9.2  MG  --  1.9   Liver Function Tests: Recent Labs  Lab 10/28/23 1707  AST 16  ALT 16  ALKPHOS 74  BILITOT 0.5  PROT 7.8  ALBUMIN 3.2*   No results for input(s): "LIPASE", "AMYLASE" in the last 168 hours. No results for input(s): "AMMONIA" in the last 168 hours. CBC: Recent Labs  Lab 10/28/23 1707 10/30/23  0507  WBC 12.6* 8.1  NEUTROABS 9.3*  --   HGB 12.9* 13.3  HCT 40.6 42.2  MCV 93.5 94.8  PLT 293 245   Cardiac Enzymes: No results for input(s): "CKTOTAL", "CKMB", "CKMBINDEX", "TROPONINI" in the last 168 hours. BNP: Invalid input(s): "POCBNP" CBG: Recent Labs  Lab 10/29/23 1608 10/29/23 2150 10/29/23 2344 10/30/23 0402 10/30/23 0751  GLUCAP 199* 269* 161* 107* 150*   D-Dimer No results for input(s): "DDIMER" in the last 72 hours. Hgb A1c No results for input(s): "HGBA1C" in the last 72 hours. Lipid Profile No results for  input(s): "CHOL", "HDL", "LDLCALC", "TRIG", "CHOLHDL", "LDLDIRECT" in the last 72 hours. Thyroid function studies No results for input(s): "TSH", "T4TOTAL", "T3FREE", "THYROIDAB" in the last 72 hours.  Invalid input(s): "FREET3" Anemia work up No results for input(s): "VITAMINB12", "FOLATE", "FERRITIN", "TIBC", "IRON", "RETICCTPCT" in the last 72 hours. Urinalysis    Component Value Date/Time   COLORURINE RED (A) 10/28/2023 2005   APPEARANCEUR HAZY (A) 10/28/2023 2005   APPEARANCEUR Clear 03/18/2023 0924   LABSPEC 1.017 10/28/2023 2005   PHURINE 6.0 10/28/2023 2005   GLUCOSEU >=500 (A) 10/28/2023 2005   HGBUR LARGE (A) 10/28/2023 2005   BILIRUBINUR NEGATIVE 10/28/2023 2005   BILIRUBINUR Negative 03/18/2023 0924   KETONESUR NEGATIVE 10/28/2023 2005   PROTEINUR 30 (A) 10/28/2023 2005   UROBILINOGEN 0.2 12/20/2019 1356   NITRITE NEGATIVE 10/28/2023 2005   LEUKOCYTESUR NEGATIVE 10/28/2023 2005   Sepsis Labs Recent Labs  Lab 10/28/23 1707 10/30/23 0507  WBC 12.6* 8.1   Microbiology Recent Results (from the past 240 hours)  Culture, blood (routine x 2)     Status: None (Preliminary result)   Collection Time: 10/28/23  5:07 PM   Specimen: Left Antecubital; Blood  Result Value Ref Range Status   Specimen Description   Final    LEFT ANTECUBITAL BOTTLES DRAWN AEROBIC AND ANAEROBIC   Special Requests   Final    Blood Culture results may not be optimal due to an inadequate volume of blood received in culture bottles   Culture   Final    NO GROWTH 2 DAYS Performed at [facility], [address], [place], [place] [address]    Report Status PENDING  Incomplete  Culture, blood (routine x 2)     Status: None (Preliminary result)   Collection Time: 10/28/23  5:07 PM   Specimen: BLOOD  Result Value Ref Range Status   Specimen Description BLOOD BLOOD RIGHT HAND  Final   Special Requests   Final    BOTTLES DRAWN AEROBIC ONLY Blood Culture results may not be optimal due to an  inadequate volume of blood received in culture bottles   Culture   Final    NO GROWTH 2 DAYS Performed at [facility], [address] [place], [place] [address]    Report Status PENDING  Incomplete     Time coordinating discharge: 35 minutes  SIGNED:   Erick Blinks, DO [facility] 10/30/2023, 10:57 AM  If 7PM-7AM, please contact night-coverage [email or web address]

## 2023-10-30 NOTE — Inpatient Diabetes Management (Addendum)
 Inpatient Diabetes Program Recommendations  AACE/ADA: New Consensus Statement on Inpatient Glycemic Control   Target Ranges:  Prepandial:   less than 140 mg/dL      Peak postprandial:   less than 180 mg/dL (1-2 hours)      Critically ill patients:  140 - 180 mg/dL    Latest Reference Range & Units 10/29/23 08:08 10/29/23 11:16 10/29/23 16:08 10/29/23 21:50 10/29/23 23:44 10/30/23 04:02 10/30/23 07:51  Glucose-Capillary 70 - 99 mg/dL 409 (H) 811 (H)  Novolog 3 units 199 (H)  Novolog 2 units 269 (H)  Novolog 5 units 161 (H) 107 (H) 150 (H)    Latest Reference Range & Units 10/28/23 20:18 10/28/23 22:14 10/29/23 01:14 10/29/23 05:36  Glucose-Capillary 70 - 99 mg/dL 914 (H) 782 (H) 956 (H) 153 (H)    Latest Reference Range & Units 10/28/23 17:07  Glucose 70 - 99 mg/dL 43 (LL)   Review of Glycemic Control  Diabetes history: DM2 Outpatient Diabetes medications: Levemir 50 units at bedtime, Novolog 30 units TID with meals, Farxiga 10 mg daily Current orders for Inpatient glycemic control: Novolog 0-9 units Q4H  Inpatient Diabetes Program Recommendations:    Insulin: Patient has only received a total of Novolog 10 units for correction over the past 20 hours and CBG 150 mg/dl this morning. If remains inpatient, consider ordering Novolog 3 units TID with meals for meal coverage if patient eats at least 50% of meals.   NOTE: Glucose trends and insulin needs from last admission 2/17-2/20/25 and this admission are much different. Based on his current trends,would not recommend adding any basal insulin yet as inpatient but looks like he needs meal coverage. At discharge, may want to decrease Levemir to 15 units at bedtime and Novolog to 5 units with meals. Patient has an appt with Dr. Margo Aye on Monday. Encourage patient again to put CGM sensor on which will give Dr. Margo Aye a lot more information to see if insulin dosages need to be adjusted more on Monday.   Thanks, Orlando Penner, RN, MSN,  CDCES Diabetes Coordinator Inpatient Diabetes Program [phone] (Team Pager from 8am to 5pm)

## 2023-10-30 NOTE — Discharge Instructions (Signed)
 Be sure to put continuous glucose monitoring (CGM) sensor on and use it for glucose monitoring.  Be sure to share the CGM sensor information with Dr. Margo Aye at your appointments.

## 2023-11-02 ENCOUNTER — Telehealth: Payer: Self-pay

## 2023-11-02 ENCOUNTER — Ambulatory Visit (HOSPITAL_COMMUNITY)
Admission: RE | Admit: 2023-11-02 | Discharge: 2023-11-02 | Disposition: A | Discharge status: Home / Self Care | Source: Ambulatory Visit | Attending: Cardiology | Admitting: Cardiology

## 2023-11-02 DIAGNOSIS — C3490 Malignant neoplasm of unspecified part of unspecified bronchus or lung: Secondary | ICD-10-CM | POA: Diagnosis not present

## 2023-11-02 DIAGNOSIS — N390 Urinary tract infection, site not specified: Secondary | ICD-10-CM | POA: Diagnosis not present

## 2023-11-02 DIAGNOSIS — Z950 Presence of cardiac pacemaker: Secondary | ICD-10-CM | POA: Insufficient documentation

## 2023-11-02 DIAGNOSIS — J449 Chronic obstructive pulmonary disease, unspecified: Secondary | ICD-10-CM | POA: Diagnosis not present

## 2023-11-02 DIAGNOSIS — I471 Supraventricular tachycardia, unspecified: Secondary | ICD-10-CM | POA: Diagnosis not present

## 2023-11-02 DIAGNOSIS — R3129 Other microscopic hematuria: Secondary | ICD-10-CM | POA: Diagnosis not present

## 2023-11-02 DIAGNOSIS — D6869 Other thrombophilia: Secondary | ICD-10-CM | POA: Diagnosis not present

## 2023-11-02 DIAGNOSIS — E1165 Type 2 diabetes mellitus with hyperglycemia: Secondary | ICD-10-CM | POA: Diagnosis not present

## 2023-11-02 DIAGNOSIS — R109 Unspecified abdominal pain: Secondary | ICD-10-CM | POA: Diagnosis not present

## 2023-11-02 LAB — CULTURE, BLOOD (ROUTINE X 2)
Culture: NO GROWTH
Culture: NO GROWTH

## 2023-11-02 NOTE — Telephone Encounter (Signed)
 Patient notified and verbalized understanding. Patient had no questions or concerns regarding obtaining CXR.

## 2023-11-02 NOTE — Telephone Encounter (Signed)
-----   Message from Dina Rich sent at 11/01/2023  5:22 PM EST ----- Please order a PA and lateral chest xray for history of pacemaker please. Let him know I spoke with dr taylor and we wanted to reassess the position of the leads based on his EKG pattern  Dominga Ferry MD ----- Message ----- From: Marinus Maw, MD Sent: 10/24/2023   2:05 PM EST To: Antoine Poche, MD  Order him a PA and Lat cxr. ----- Message ----- From: Antoine Poche, MD Sent: 10/22/2023  10:49 AM EST To: Marinus Maw, MD  Sharlot Gowda, mutual patient. Admitted with HF exacerbation. His BiV pacing morphology had changed between October and this admission, is there anything additional outpatient he may need. NOrmal device check last month. Nonurgent and with the inclement weather did not have device checked during admission.   Dominga Ferry MD

## 2023-11-03 ENCOUNTER — Other Ambulatory Visit: Payer: Self-pay

## 2023-11-03 NOTE — Patient Outreach (Signed)
 Care Management  Transitions of Care Program Transitions of Care Post-discharge week 2  11/03/2023 Name: Roger Lowe. MRN: [id number] DOB: 30-Jan-1946  Subjective: Roger Lowe. is a 78 y.o. year old male who is a primary care patient of Margo Aye, Kathleene Hazel, MD. The Care Management team was unable to reach the patient by phone to assess and address transitions of care needs.  Patient  Outreach attempt is in the course of  VBCI  30-day TOC program. Pt previously agreed and is enrolled in the  program due to potential risk for readmission and/or high utilization. Unfortunately, I was not able to speak with the patient in regards to recent hospital discharge  Patient was previously enrolled and will restart the 30 day Program once contact has been reestablished .  I was unable to leave a message for patient his voice mail was full     Plan: Additional outreach attempts will be made to reach the patient enrolled in the [facility] Program (Post Inpatient/ED Visit).  Susa Loffler , BSN, RN [facility]   VBCI-Population Health RN Care Manager Direct Dial [phone]  Fax: [phone] Website: [email or web address]

## 2023-11-05 ENCOUNTER — Telehealth: Payer: Self-pay

## 2023-11-05 NOTE — Patient Outreach (Signed)
 Care Management  Transitions of Care Program Transitions of Care Post-discharge week 2  11/05/2023 Name: Mesiah Manzo. MRN: [id number] DOB: Jan 06, 1946  Subjective: Roger Lowe. is a 78 y.o. year old male who is a primary care patient of Margo Aye, Kathleene Hazel, MD. The Care Management team was unable to reach the patient by phone to assess and address transitions of care needs.  Patient  Outreach attempt is in the course of  VBCI  30-day TOC program. Pt previously agreed and is enrolled in the  program due to potential risk for readmission and/or high utilization. Unfortunately, I was not able to speak with the patient in regards to recent hospital discharge  I was unable to leave a message Patient's voice mail was full   Plan: Additional outreach attempts will be made to reach the patient enrolled in the [facility] Program (Post Inpatient/ED Visit).  Susa Loffler , BSN, RN [facility]   VBCI-Population Health RN Care Manager Direct Dial [phone]  Fax: [phone] Website: [email or web address]

## 2023-11-06 ENCOUNTER — Telehealth: Payer: Self-pay

## 2023-11-06 NOTE — Patient Outreach (Signed)
 Care Management  Transitions of Care Program Transitions of Care Post-discharge week 2 Program Close   11/06/2023 Name: Roger Lowe. MRN: [id number] DOB: Jul 08, 1946  Subjective: Roger Hank. is a 78 y.o. year old male who is a primary care patient of Margo Aye, Kathleene Hazel, MD. The Care Management team was unable to reach the patient by phone to assess and address transitions of care needs. I was unable to leave a message His voice mail is full   Patient  Outreach attempt is in the course of  VBCI  30-day TOC program. Pt previously agreed and is enrolled in the  program due to potential risk for readmission and/or high utilization.   The patient has previously been  provided with contact information for the [facility] care management team and has been advised to call with any health related questions or concerns.   Patient was encouraged to Contact PCP with any changes in baseline or  medication regimen,  changes in health status  /  well-being, safety concerns, including falls any questions or concerns regarding ongoing medical care, any difficulty obtaining or picking up prescriptions, any changes or worsening in condition- including  symptoms not relieved  with interventions      Based on the VBCI program guidelines, if  we are unable to reach the patient  after 3 attempts, no additional outreach attempts will be made and the TOC follow-up will be closed Unfortunately, we have been unable to make contact with the patient for follow up.   The [facility] Case Management Team is available to follow up with the patient after provider conversation with the patient regarding recommendation for care management engagement and subsequent re-referral to the case management team.The VBCI CM team can be reached by calling [phone].   Plan: No further outreach attempts will be made at this time.  We have been unable to reach the patient.  Susa Loffler , BSN, RN [facility]   VBCI-Population  Health RN Care Manager Direct Dial [phone]  Fax: [phone] Website: [email or web address]

## 2023-11-10 DIAGNOSIS — J479 Bronchiectasis, uncomplicated: Secondary | ICD-10-CM | POA: Diagnosis not present

## 2023-11-10 DIAGNOSIS — J9621 Acute and chronic respiratory failure with hypoxia: Secondary | ICD-10-CM | POA: Diagnosis not present

## 2023-11-10 DIAGNOSIS — I130 Hypertensive heart and chronic kidney disease with heart failure and stage 1 through stage 4 chronic kidney disease, or unspecified chronic kidney disease: Secondary | ICD-10-CM | POA: Diagnosis not present

## 2023-11-10 DIAGNOSIS — N390 Urinary tract infection, site not specified: Secondary | ICD-10-CM | POA: Diagnosis not present

## 2023-11-10 DIAGNOSIS — I447 Left bundle-branch block, unspecified: Secondary | ICD-10-CM | POA: Diagnosis not present

## 2023-11-10 DIAGNOSIS — J449 Chronic obstructive pulmonary disease, unspecified: Secondary | ICD-10-CM | POA: Diagnosis not present

## 2023-11-10 DIAGNOSIS — E1122 Type 2 diabetes mellitus with diabetic chronic kidney disease: Secondary | ICD-10-CM | POA: Diagnosis not present

## 2023-11-10 DIAGNOSIS — N1831 Chronic kidney disease, stage 3a: Secondary | ICD-10-CM | POA: Diagnosis not present

## 2023-11-10 DIAGNOSIS — I5022 Chronic systolic (congestive) heart failure: Secondary | ICD-10-CM | POA: Diagnosis not present

## 2023-11-11 ENCOUNTER — Encounter (HOSPITAL_COMMUNITY): Payer: Self-pay | Admitting: Radiology

## 2023-11-11 ENCOUNTER — Ambulatory Visit (HOSPITAL_COMMUNITY)
Admission: RE | Admit: 2023-11-11 | Discharge: 2023-11-11 | Disposition: A | Discharge status: Home / Self Care | Payer: Medicare HMO | Source: Ambulatory Visit | Attending: Hematology | Admitting: Hematology

## 2023-11-11 DIAGNOSIS — C3490 Malignant neoplasm of unspecified part of unspecified bronchus or lung: Secondary | ICD-10-CM | POA: Diagnosis present

## 2023-11-11 DIAGNOSIS — I2699 Other pulmonary embolism without acute cor pulmonale: Secondary | ICD-10-CM | POA: Diagnosis not present

## 2023-11-11 DIAGNOSIS — J439 Emphysema, unspecified: Secondary | ICD-10-CM | POA: Diagnosis not present

## 2023-11-11 DIAGNOSIS — J479 Bronchiectasis, uncomplicated: Secondary | ICD-10-CM | POA: Diagnosis not present

## 2023-11-11 MED ORDER — IOHEXOL 300 MG/ML  SOLN
75.0000 mL | Freq: Once | INTRAMUSCULAR | Status: AC | PRN
  Administered 2023-11-11: 75 mL via INTRAVENOUS

## 2023-11-16 ENCOUNTER — Ambulatory Visit: Payer: Medicare HMO

## 2023-11-16 DIAGNOSIS — I428 Other cardiomyopathies: Secondary | ICD-10-CM

## 2023-11-16 DIAGNOSIS — I5022 Chronic systolic (congestive) heart failure: Secondary | ICD-10-CM | POA: Diagnosis not present

## 2023-11-17 ENCOUNTER — Inpatient Hospital Stay: Attending: Internal Medicine

## 2023-11-17 ENCOUNTER — Encounter: Payer: Self-pay | Admitting: Internal Medicine

## 2023-11-17 DIAGNOSIS — I130 Hypertensive heart and chronic kidney disease with heart failure and stage 1 through stage 4 chronic kidney disease, or unspecified chronic kidney disease: Secondary | ICD-10-CM | POA: Diagnosis not present

## 2023-11-17 DIAGNOSIS — I5022 Chronic systolic (congestive) heart failure: Secondary | ICD-10-CM

## 2023-11-17 DIAGNOSIS — Z95810 Presence of automatic (implantable) cardiac defibrillator: Secondary | ICD-10-CM

## 2023-11-17 DIAGNOSIS — J449 Chronic obstructive pulmonary disease, unspecified: Secondary | ICD-10-CM | POA: Diagnosis not present

## 2023-11-17 DIAGNOSIS — J479 Bronchiectasis, uncomplicated: Secondary | ICD-10-CM | POA: Diagnosis not present

## 2023-11-17 DIAGNOSIS — E1122 Type 2 diabetes mellitus with diabetic chronic kidney disease: Secondary | ICD-10-CM | POA: Diagnosis not present

## 2023-11-17 DIAGNOSIS — N390 Urinary tract infection, site not specified: Secondary | ICD-10-CM | POA: Diagnosis not present

## 2023-11-17 DIAGNOSIS — J9621 Acute and chronic respiratory failure with hypoxia: Secondary | ICD-10-CM | POA: Diagnosis not present

## 2023-11-17 DIAGNOSIS — I447 Left bundle-branch block, unspecified: Secondary | ICD-10-CM | POA: Diagnosis not present

## 2023-11-17 DIAGNOSIS — N1831 Chronic kidney disease, stage 3a: Secondary | ICD-10-CM | POA: Diagnosis not present

## 2023-11-17 LAB — CUP PACEART REMOTE DEVICE CHECK
Battery Remaining Longevity: 47 mo
Battery Remaining Percentage: 52 %
Battery Voltage: 2.95 V
Brady Statistic AP VP Percent: 1 %
Brady Statistic AP VS Percent: 1 %
Brady Statistic AS VP Percent: 98 %
Brady Statistic AS VS Percent: 1.9 %
Brady Statistic RA Percent Paced: 1 %
Date Time Interrogation Session: 20250316221126
HighPow Impedance: 78 Ohm
Implantable Lead Connection Status: 753985
Implantable Lead Connection Status: 753985
Implantable Lead Connection Status: 753985
Implantable Lead Implant Date: 20210308
Implantable Lead Implant Date: 20210308
Implantable Lead Implant Date: 20210308
Implantable Lead Location: 753858
Implantable Lead Location: 753859
Implantable Lead Location: 753860
Implantable Lead Model: 7122
Implantable Pulse Generator Implant Date: 20210308
Lead Channel Impedance Value: 390 Ohm
Lead Channel Impedance Value: 500 Ohm
Lead Channel Impedance Value: 840 Ohm
Lead Channel Pacing Threshold Amplitude: 0.625 V
Lead Channel Pacing Threshold Amplitude: 0.75 V
Lead Channel Pacing Threshold Amplitude: 1 V
Lead Channel Pacing Threshold Pulse Width: 0.5 ms
Lead Channel Pacing Threshold Pulse Width: 0.5 ms
Lead Channel Pacing Threshold Pulse Width: 0.5 ms
Lead Channel Sensing Intrinsic Amplitude: 11.8 mV
Lead Channel Sensing Intrinsic Amplitude: 3.2 mV
Lead Channel Setting Pacing Amplitude: 1.625
Lead Channel Setting Pacing Amplitude: 2 V
Lead Channel Setting Pacing Amplitude: 2.5 V
Lead Channel Setting Pacing Pulse Width: 0.5 ms
Lead Channel Setting Pacing Pulse Width: 0.5 ms
Lead Channel Setting Sensing Sensitivity: 0.5 mV
Pulse Gen Serial Number: 111019088
Zone Setting Status: 755011

## 2023-11-17 NOTE — Progress Notes (Signed)
 EPIC Encounter for ICM Monitoring  Patient Name: Roger Lowe. is a 77 y.o. male Date: 11/17/2023 Primary Care Physican: Benita Stabile, MD Primary Cardiologist: Branch Electrophysiologist: Ladona Ridgel BiV Pacing: 98% 02/11/2023 Weight: 220 lbs 07/03/2023 Weight: 214 lbs 09/09/2023 Office Weight: 218 lbs 11/17/2023 Weight:  189 lbs          Spoke with patient and heart failure questions reviewed.  Transmission results reviewed.  Pt asymptomatic for fluid accumulation.  He has overall weakness and receiving PT at home.   He has lost a significant amount of weight, unintentionally, over the last month but is starting to get his appetite back.  He was hospitalized 2/17-2/20 for Acute on Chronic HF and 2/26-2/28 for hypoglycemia.    CorVue thoracic impedance suggesting possible fluid accumulation starting 3/12 but starting to trend back toward baseline.   Prescribed: Furosemide 40 mg take 1 tablet daily. Spironolactone 25 mg take 1 tablet daily   Labs: 10/30/2023 Creatinine 1.34, BUN 18, Potassium 4.8, Sodium 134, GFR 55 10/28/2023 Creatinine 1.46, BUN 20, Potassium 4.0, Sodium 137, GFR 50 10/22/2023 Creatinine 1.24, BUN 18, Potassium 4.2, Sodium 136, GFR >60  10/21/2023 Creatinine 1.56, BUN 17, Potassium 3.6, Sodium 135, GFR 46  10/20/2023 Creatinine 1.19, BUN 12, Potassium 4.3, Sodium 134, GFR >60  10/19/2023 Creatinine 1.29, BUN 10, Potassium 4.3, Sodium 137, GFR 58 (11:46 AM) 10/19/2023 Creatinine 1.23, BUN 10, Potassium 4.1, Sodium 136, GFR >60 (9:58 AM) A complete set of results can be found in Results Review.   Recommendations: Advised will sent copy to Randall An, [place] for review at 3/19 OV.  He confirmed he will be at the appointment.    Follow-up plan: ICM clinic phone appointment on 11/24/2023 to recheck fluid levels.   91 day device clinic remote transmission 02/15/2024.     EP/Cardiology Office Visits:   11/18/2023 with Randall An, PA for hospital follow up.  12/10/2023 with Dr  Wyline Mood.   Recall 09/03/2024 with Dr. Ladona Ridgel.     Copy of ICM check sent to Dr. Ladona Ridgel.   3 month ICM trend: 11/17/2023.    12-14 Month ICM trend:     Karie Soda, RN 11/17/2023 7:28 AM

## 2023-11-18 ENCOUNTER — Ambulatory Visit: Payer: Medicare HMO | Attending: Student | Admitting: Student

## 2023-11-18 ENCOUNTER — Encounter: Payer: Self-pay | Admitting: Student

## 2023-11-18 VITALS — BP 122/80 | HR 118 | Ht 72.0 in | Wt 196.0 lb

## 2023-11-18 DIAGNOSIS — E782 Mixed hyperlipidemia: Secondary | ICD-10-CM

## 2023-11-18 DIAGNOSIS — I25118 Atherosclerotic heart disease of native coronary artery with other forms of angina pectoris: Secondary | ICD-10-CM

## 2023-11-18 DIAGNOSIS — Z95810 Presence of automatic (implantable) cardiac defibrillator: Secondary | ICD-10-CM | POA: Diagnosis not present

## 2023-11-18 DIAGNOSIS — I10 Essential (primary) hypertension: Secondary | ICD-10-CM | POA: Diagnosis not present

## 2023-11-18 DIAGNOSIS — I5032 Chronic diastolic (congestive) heart failure: Secondary | ICD-10-CM | POA: Diagnosis not present

## 2023-11-18 DIAGNOSIS — Z86711 Personal history of pulmonary embolism: Secondary | ICD-10-CM | POA: Diagnosis not present

## 2023-11-18 DIAGNOSIS — Z79899 Other long term (current) drug therapy: Secondary | ICD-10-CM | POA: Diagnosis not present

## 2023-11-18 DIAGNOSIS — R002 Palpitations: Secondary | ICD-10-CM

## 2023-11-18 DIAGNOSIS — R000 Tachycardia, unspecified: Secondary | ICD-10-CM | POA: Diagnosis not present

## 2023-11-18 MED ORDER — CARVEDILOL 25 MG PO TABS: 50.0000 mg | ORAL_TABLET | Freq: Two times a day (BID) | ORAL | 3 refills | Status: AC

## 2023-11-18 MED ORDER — CARVEDILOL 25 MG PO TABS: 25.0000 mg | ORAL_TABLET | Freq: Two times a day (BID) | ORAL | 3 refills | Status: DC

## 2023-11-18 MED ORDER — AMLODIPINE BESYLATE 5 MG PO TABS: 5.0000 mg | ORAL_TABLET | Freq: Every day | ORAL | 3 refills | Status: AC

## 2023-11-18 NOTE — Progress Notes (Signed)
 Cardiology Office Note    Date:  11/18/2023  ID:  Roger Flatten., DOB 22-Nov-1945, MRN [id number] Cardiologist: Dina Rich, MD   EP: Dr. Ladona Ridgel  History of Present Illness:    Roger Lafoe. is a 78 y.o. 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  He was last examined by the cardiology service during an admission in 10/2023 for evaluation of chest pain and his pain was overall felt to be atypical and not consistent with a cardiac etiology. Hs troponin values were not consistent with ACS. He did have episodes of a wide-complex tachycardia and was felt most consistent with SVT with paced ventricular complexes. Was treated for a CHF exacerbation during admission and likely due to SVT. Coreg was titrated to 37.5 mg twice daily during admission.  He was again admitted later that month but for hypoglycemia and it does not appear that changes were made to his cardiac medications at that time. He did have a repeat 2 view CXR earlier this month for reassessment of his pacemaker leads and this actually resulted today and showed unchanged leads by review of the report. He did have a device interrogation yesterday and thoracic impedance suggested possible fluid accumulation starting 11/11/2023 but trending back to baseline. Weight was at 189 lbs on his home scales.   In talking with the patient and his brother today, he reports still having dyspnea and fatigue since his recent admission. He is mostly in an electric wheelchair during the day but uses his walker as well. Has intermittent chest pain which can last for hours at a time and is not  associated with exertion or food consumption. Also reports palpitations and he is unsure if these occur at the same time. No recent orthopnea, PND or pitting edema. Weight has continued to decline given diet changes. He is on supplemental oxygen for COPD and uses between 2-4 L.   Studies Reviewed:   EKG: EKG is ordered today and demonstrates:   EKG Interpretation Date/Time:  Wednesday November 18 2023 16:20:15 EDT Ventricular Rate:  124 PR Interval:  152 QRS Duration:  172 QT Interval:  360 QTC Calculation: 517 R Axis:   258  Text Interpretation: V-paced rhythm. Questionable PMT Confirmed by Randall An ([id number]) on 11/18/2023 8:09:19 PM       Echocardiogram: 10/2023 IMPRESSIONS     1. Left ventricular ejection fraction, by estimation, is 60 to 65%. The  left ventricle has normal function. The left ventricle has no regional  wall motion abnormalities. There is moderate left ventricular hypertrophy  of the septal segment. Left  ventricular diastolic parameters are consistent with Grade I diastolic  dysfunction (impaired relaxation).   2. Right ventricular systolic function is normal. The right ventricular  size is normal.   3. The mitral valve is normal in structure. No evidence of mitral valve  regurgitation. No evidence of mitral stenosis.   4. The aortic valve is tricuspid. There is mild calcification of the  aortic valve. Aortic valve regurgitation is not visualized. No aortic  stenosis is present.   5. The inferior vena cava is normal in size  with greater than 50%  respiratory variability, suggesting right atrial pressure of 3 mmHg.   Comparison(s): No significant change from prior study.    Physical Exam:   VS:  BP 122/80 (BP Location: Right Arm, Cuff Size: Normal)   Pulse (!) 118   Ht 6' (1.829 m)   Wt 196 lb (88.9 kg)   SpO2 96%   BMI 26.58 kg/m    Wt Readings from Last 3 Encounters:  11/18/23 196 lb (88.9 kg)  10/28/23 195 lb 8.8 oz (88.7 kg)  10/22/23  195 lb 8.8 oz (88.7 kg)     GEN: Pleasant, elderly male appearing in no acute distress. Sitting in wheelchair.  NECK: No JVD; No carotid bruits CARDIAC: Regular rhythm, tachycardiac rate. No murmurs, rubs, gallops RESPIRATORY:  Clear to auscultation without rales, wheezing or rhonchi. On 3L [place].  ABDOMEN: Appears non-distended. No obvious abdominal masses. EXTREMITIES: No clubbing or cyanosis. No pitting edema.  Distal pedal pulses are 2+ bilaterally.   Assessment and Plan:   1. Chronic HFimpEF - His EF was previously as low as 20-25% but has normalized in the interim. He appears euvolemic by examination today and recent thoracic impendence suggested possible fluid accumulation starting on 3/12 but trending back to baseline. Given his weight loss and variable intake, I recommended continuing his current diuretic regimen for now with Farxiga 10mg  daily and Lasix 40mg  daily. Will adjust his Coreg as discussed below as controlling his tachycardia should help with his volume status as well.   2. History of Chest Pain - He does report intermittent episodes of chest pain which overall have atypical qualities for angina as pain occurs at rest and can last for hours at a time. Possibly due to his tachycardia. Prior cath in 2020 showed mild, nonobstructive CAD and he has been on Xarelto for anticoagulation which makes a recurrent PE less likely. If pain persists despite adequate rate-control, could arrange for repeat stress testing.   3. ICD in place - Followed by Dr. Ladona Ridgel. Was functioning normally by device interrogation yesterday.   4. SVT - Noted during recent admission. EKG today shows tachycardia and in reviewing with Dr. Diona Browner (DOD), possibly PMT. He recommended asking Dr. Ladona Ridgel to review and give further recommendations and a message has been sent to him. Will titrate Coreg from 37.5mg  BID to 50mg  BID. Will also recheck labs to include CBC, BMET, TSH and Mg.   5. HTN - BP is  well-controlled at 122/80 during today's visit. Given titration of Coreg to 50mg  BID, will reduce Amlodipine to 5mg  daily. Continue Spironolactone 25mg  daily and Hydralazine 75mg  TID.   6. HLD - Followed by his PCP. Remains on Atorvastatin 40mg  daily.   7. History of PE/DVT - No reports of active bleeding. Remains on Xarelto 20mg  daily which is the correct dose at this time given his calculated CrCl by most recent labs.   Disposition: He has previously scheduled follow-up with Dr. Wyline Mood in 12/2023 and will keep that for now as he may require further medication adjustments pending reassessment of HR and BP. A staff message was also sent to Dr. Ladona Ridgel to obtain his recommendations in the interim.   Signed, Ellsworth Lennox, PA-C

## 2023-11-18 NOTE — Patient Instructions (Signed)
 Medication Instructions:   Decrease Amlodipine ( Norvasc ) to 5 mg daily  Increase Coreg to 50 mg Two Times Daily   *If you need a refill on your cardiac medications before your next appointment, please call your pharmacy*   Lab Work: Your physician recommends that you return for lab work. ( TSH, BMET, Mg, CBC)   If you have labs (blood work) drawn today and your tests are completely normal, you will receive your results only by: MyChart Message (if you have MyChart) OR A paper copy in the mail If you have any lab test that is abnormal or we need to change your treatment, we will call you to review the results.   Testing/Procedures: NONE    Follow-Up: At [facility], you and your health needs are our priority.  As part of our continuing mission to provide you with exceptional heart care, we have created designated Provider Care Teams.  These Care Teams include your primary Cardiologist (physician) and Advanced Practice Providers (APPs -  Physician Assistants and Nurse Practitioners) who all work together to provide you with the care you need, when you need it.  We recommend signing up for the patient portal called "MyChart".  Sign up information is provided on this After Visit Summary.  MyChart is used to connect with patients for Virtual Visits (Telemedicine).  Patients are able to view lab/test results, encounter notes, upcoming appointments, etc.  Non-urgent messages can be sent to your provider as well.   To learn more about what you can do with MyChart, go to [email or web address].    Your next appointment:    April   Provider:   You may see Dina Rich, MD or one of the following Advanced Practice Providers on your designated Care Team:   Randall An, PA-C  Jacolyn Reedy, [place]     Other Instructions Thank you for choosing [facility]!

## 2023-11-19 ENCOUNTER — Inpatient Hospital Stay: Payer: Medicare HMO | Attending: Hematology | Admitting: Hematology

## 2023-11-19 VITALS — HR 109 | Temp 98.1°F | Ht 72.0 in | Wt 196.0 lb

## 2023-11-19 DIAGNOSIS — C3490 Malignant neoplasm of unspecified part of unspecified bronchus or lung: Secondary | ICD-10-CM | POA: Diagnosis not present

## 2023-11-19 DIAGNOSIS — Z9221 Personal history of antineoplastic chemotherapy: Secondary | ICD-10-CM | POA: Diagnosis not present

## 2023-11-19 DIAGNOSIS — Z86718 Personal history of other venous thrombosis and embolism: Secondary | ICD-10-CM | POA: Insufficient documentation

## 2023-11-19 DIAGNOSIS — Z85118 Personal history of other malignant neoplasm of bronchus and lung: Secondary | ICD-10-CM | POA: Diagnosis not present

## 2023-11-19 DIAGNOSIS — Z86711 Personal history of pulmonary embolism: Secondary | ICD-10-CM | POA: Insufficient documentation

## 2023-11-19 DIAGNOSIS — Z87891 Personal history of nicotine dependence: Secondary | ICD-10-CM | POA: Diagnosis not present

## 2023-11-19 DIAGNOSIS — N1831 Chronic kidney disease, stage 3a: Secondary | ICD-10-CM | POA: Diagnosis not present

## 2023-11-19 DIAGNOSIS — E1122 Type 2 diabetes mellitus with diabetic chronic kidney disease: Secondary | ICD-10-CM | POA: Diagnosis not present

## 2023-11-19 DIAGNOSIS — I5022 Chronic systolic (congestive) heart failure: Secondary | ICD-10-CM | POA: Diagnosis not present

## 2023-11-19 DIAGNOSIS — Z923 Personal history of irradiation: Secondary | ICD-10-CM | POA: Insufficient documentation

## 2023-11-19 DIAGNOSIS — J479 Bronchiectasis, uncomplicated: Secondary | ICD-10-CM | POA: Diagnosis not present

## 2023-11-19 DIAGNOSIS — J449 Chronic obstructive pulmonary disease, unspecified: Secondary | ICD-10-CM | POA: Diagnosis not present

## 2023-11-19 DIAGNOSIS — N390 Urinary tract infection, site not specified: Secondary | ICD-10-CM | POA: Diagnosis not present

## 2023-11-19 DIAGNOSIS — I130 Hypertensive heart and chronic kidney disease with heart failure and stage 1 through stage 4 chronic kidney disease, or unspecified chronic kidney disease: Secondary | ICD-10-CM | POA: Diagnosis not present

## 2023-11-19 DIAGNOSIS — J9621 Acute and chronic respiratory failure with hypoxia: Secondary | ICD-10-CM | POA: Diagnosis not present

## 2023-11-19 DIAGNOSIS — I447 Left bundle-branch block, unspecified: Secondary | ICD-10-CM | POA: Diagnosis not present

## 2023-11-19 NOTE — Progress Notes (Signed)
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 ABDOMEN AND PELVIS WITHOUT CONTRAST TECHNIQUE: Multidetector CT imaging of the abdomen and pelvis was performed following the standard protocol without IV contrast. RADIATION DOSE REDUCTION: This exam was performed according to the departmental dose-optimization program which includes automated exposure control, adjustment of the mA and/or kV according to patient size and/or use of iterative reconstruction technique. COMPARISON:  PET-CT 05/23/2021. CT chest abdomen and pelvis 05/06/2021 FINDINGS: Lower chest: Motion artifact limits evaluation but there is evidence of patchy infiltration in the lung bases with probable focal consolidation in the left lung base. The focal area of consolidation is less prominent than on the previous study but could represent residual mass lesion. Probable mild bronchiectasis. Changes may indicate early bronchogenic pneumonia. Hepatobiliary: No focal liver abnormality is seen. No gallstones, gallbladder wall thickening, or biliary dilatation.  Pancreas: Fatty replacement of the pancreas. No acute abnormalities. Spleen: Normal in size without focal abnormality. Adrenals/Urinary Tract: No adrenal gland nodules. Left renal cysts measuring up to 5.9 cm diameter. No change since prior study. No imaging follow-up is indicated. No hydronephrosis or hydroureter. Diffuse bladder wall thickening may be due to outlet obstruction or cystitis. Correlate with urinalysis. The bladder is not abnormally distended. Stomach/Bowel: Stomach is within normal limits. Appendix appears normal. No evidence of bowel wall thickening, distention, or inflammatory changes. Vascular/Lymphatic: Aortic atherosclerosis. No enlarged abdominal or pelvic lymph nodes. Reproductive: Prostate gland is markedly enlarged, measuring 7.1 cm in diameter. Other: No abdominal wall hernia or abnormality. No abdominopelvic ascites. Musculoskeletal: Degenerative changes in the spine. No focal bone lesions. IMPRESSION: 1. Diffuse thickening of the bladder wall may indicate cystitis or outlet obstruction. The prostate gland is markedly enlarged. 2. Focal area of consolidation in the left lung base is smaller than on prior study possibly representing residual mass. 3. Peribronchial infiltration and bronchiectasis in the lung bases may represent early bronchopneumonia. 4. Aortic atherosclerosis. Electronically Signed   By: Burman Nieves M.D.   On: 10/28/2023 17:45   ECHOCARDIOGRAM COMPLETE Result Date: 10/21/2023    ECHOCARDIOGRAM REPORT   Patient Name:   Roger Lowe. Date of Exam: 10/21/2023 Medical Rec #:  [id number]     Height:       72.0 in Accession #:    [id number]    Weight:       192.7 lb  Date of Birth:  1946/01/31     BSA:          2.097 m Patient Age:    77 years      BP:           106/71 mmHg Patient Gender: M             HR:           89 bpm. [facility] Procedure: 2D Echo (Both Spectral and Color Flow Doppler were utilized during            procedure). Indications:    Dyspnea   History:        Patient has prior history of Echocardiogram examinations, most                 recent 08/17/2020. CHF, CAD, CKD 3a and COPD, Arrythmias:LBBB;                 Risk Factors:Hypertension, Diabetes and Former Smoker.  Sonographer:    Dondra Prader RVT RCS Referring Phys: [id number] Dorothe Pea BRANCH IMPRESSIONS  1. Left ventricular ejection fraction, by estimation, is 60 to 65%. The left ventricle has normal function. The left ventricle has no regional wall motion abnormalities. There is moderate left ventricular hypertrophy of the septal segment. Left ventricular diastolic parameters are consistent with Grade I diastolic dysfunction (impaired relaxation).  2. Right ventricular systolic function is normal. The right ventricular size is normal.  3. The mitral valve is normal in structure. No evidence of mitral valve regurgitation. No evidence of mitral stenosis.  4. The aortic valve is tricuspid. There is mild calcification of the aortic valve. Aortic valve regurgitation is not visualized. No aortic stenosis is present.  5. The inferior vena cava is normal in size with greater than 50% respiratory variability, suggesting right atrial pressure of 3 mmHg. Comparison(s): No significant change from prior study. FINDINGS  Left Ventricle: Left ventricular ejection fraction, by estimation, is 60 to 65%. The left ventricle has normal function. The left ventricle has no regional wall motion abnormalities. Strain imaging was not performed. The left ventricular internal cavity  size was normal in size. There is moderate left ventricular hypertrophy of the septal segment. Left ventricular diastolic parameters are consistent with Grade I diastolic dysfunction (impaired relaxation). Normal left ventricular filling pressure. Right Ventricle: The right ventricular size is normal. No increase in right ventricular wall thickness. Right ventricular systolic function is normal. Left Atrium: Left atrial size was normal in size.  Right Atrium: Right atrial size was normal in size. Pericardium: There is no evidence of pericardial effusion. Mitral Valve: The mitral valve is normal in structure. No evidence of mitral valve regurgitation. No evidence of mitral valve stenosis. Tricuspid Valve: The tricuspid valve is normal in structure. Tricuspid valve regurgitation is trivial. No evidence of tricuspid stenosis. Aortic Valve: The aortic valve is tricuspid. There is mild calcification of the aortic valve. Aortic valve regurgitation is not visualized. No aortic stenosis is present. Aortic valve mean gradient measures 2.0 mmHg. Aortic valve peak gradient measures 3.9 mmHg. Aortic valve area, by VTI measures 2.96 cm. Pulmonic Valve: The pulmonic valve was normal in structure. Pulmonic valve regurgitation is trivial. No evidence of pulmonic stenosis. Aorta: The aortic root and ascending aorta are structurally normal, with no evidence of dilitation. Venous: The inferior vena cava is normal in size with greater than 50% respiratory variability, suggesting right atrial pressure of 3 mmHg. IAS/Shunts: The atrial septum is grossly  normal. Additional Comments: 3D imaging was not performed. A device lead is visualized.  LEFT VENTRICLE PLAX 2D LVIDd:         3.65 cm   Diastology LVIDs:         2.40 cm   LV e' medial:    7.56 cm/s LV PW:         1.15 cm   LV E/e' medial:  5.0 LV IVS:        1.25 cm   LV e' lateral:   7.29 cm/s LVOT diam:     2.10 cm   LV E/e' lateral: 5.2 LV SV:         49 LV SV Index:   23 LVOT Area:     3.46 cm  RIGHT VENTRICLE             IVC RV Basal diam:  3.40 cm     IVC diam: 1.30 cm RV S prime:     10.80 cm/s TAPSE (M-mode): 1.7 cm LEFT ATRIUM             Index        RIGHT ATRIUM           Index LA diam:        3.20 cm 1.53 cm/m   RA Area:     14.60 cm LA Vol (A2C):   25.7 ml 12.26 ml/m  RA Volume:   41.70 ml  19.88 ml/m LA Vol (A4C):   20.2 ml 9.63 ml/m LA Biplane Vol: 24.1 ml 11.49 ml/m  AORTIC VALVE                     PULMONIC VALVE AV Area (Vmax):    3.03 cm     PV Vmax:          0.57 m/s AV Area (Vmean):   2.95 cm     PV Peak grad:     1.3 mmHg AV Area (VTI):     2.96 cm     PR End Diast Vel: 10.24 msec AV Vmax:           98.20 cm/s AV Vmean:          65.600 cm/s AV VTI:            0.165 m AV Peak Grad:      3.9 mmHg AV Mean Grad:      2.0 mmHg LVOT Vmax:         85.90 cm/s LVOT Vmean:        55.800 cm/s LVOT VTI:          0.141 m LVOT/AV VTI ratio: 0.85  AORTA Ao Asc diam: 3.10 cm MITRAL VALVE               TRICUSPID VALVE MV Area (PHT): 6.83 cm    TR Peak grad:   23.4 mmHg MV Decel Time: 111 msec    TR Vmax:        242.00 cm/s MV E velocity: 37.80 cm/s MV A velocity: 76.40 cm/s  SHUNTS MV E/A ratio:  0.49        Systemic VTI:  0.14 m                            Systemic Diam: 2.10 cm Vishnu Priya Mallipeddi Electronically signed by Winfield Rast Mallipeddi Signature Date/Time: 10/21/2023/3:32:51 PM    Final

## 2023-11-19 NOTE — Patient Instructions (Signed)
 [facility] at [facility] Discharge Instructions   You were seen and examined today by Dr. Ellin Saba.  He reviewed the results of your lab work which are normal/stable.   He reviewed the results of your CT scan which is stable.   We will see you back in 6 months. We will repeat lab work and a CT scan prior to this visit.   Return as scheduled.    Thank you for choosing [facility] at [facility] to provide your oncology and hematology care.  To afford each patient quality time with our provider, please arrive at least 15 minutes before your scheduled appointment time.   If you have a lab appointment with the Cancer Center please come in thru the Main Entrance and check in at the main information desk.  You need to re-schedule your appointment should you arrive 10 or more minutes late.  We strive to give you quality time with our providers, and arriving late affects you and other patients whose appointments are after yours.  Also, if you no show three or more times for appointments you may be dismissed from the clinic at the providers discretion.     Again, thank you for choosing [facility].  Our hope is that these requests will decrease the amount of time that you wait before being seen by our physicians.       _____________________________________________________________  Should you have questions after your visit to [facility], please contact our office at [phone] and follow the prompts.  Our office hours are 8:00 a.m. and 4:30 p.m. Monday - Friday.  Please note that voicemails left after 4:00 p.m. may not be returned until the following business day.  We are closed weekends and major holidays.  You do have access to a nurse 24-7, just call the main number to the clinic [phone] and do not press any options, hold on the line and a nurse will answer the phone.    For prescription refill requests, have your  pharmacy contact our office and allow 72 hours.    Due to Covid, you will need to wear a mask upon entering the hospital. If you do not have a mask, a mask will be given to you at the Main Entrance upon arrival. For doctor visits, patients may have 1 support person age 22 or older with them. For treatment visits, patients can not have anyone with them due to social distancing guidelines and our immunocompromised population.

## 2023-11-23 DIAGNOSIS — I447 Left bundle-branch block, unspecified: Secondary | ICD-10-CM | POA: Diagnosis not present

## 2023-11-23 DIAGNOSIS — J449 Chronic obstructive pulmonary disease, unspecified: Secondary | ICD-10-CM | POA: Diagnosis not present

## 2023-11-23 DIAGNOSIS — E1065 Type 1 diabetes mellitus with hyperglycemia: Secondary | ICD-10-CM | POA: Diagnosis not present

## 2023-11-23 DIAGNOSIS — N390 Urinary tract infection, site not specified: Secondary | ICD-10-CM | POA: Diagnosis not present

## 2023-11-23 DIAGNOSIS — I5022 Chronic systolic (congestive) heart failure: Secondary | ICD-10-CM | POA: Diagnosis not present

## 2023-11-23 DIAGNOSIS — I130 Hypertensive heart and chronic kidney disease with heart failure and stage 1 through stage 4 chronic kidney disease, or unspecified chronic kidney disease: Secondary | ICD-10-CM | POA: Diagnosis not present

## 2023-11-23 DIAGNOSIS — J9621 Acute and chronic respiratory failure with hypoxia: Secondary | ICD-10-CM | POA: Diagnosis not present

## 2023-11-23 DIAGNOSIS — E1122 Type 2 diabetes mellitus with diabetic chronic kidney disease: Secondary | ICD-10-CM | POA: Diagnosis not present

## 2023-11-23 DIAGNOSIS — N1831 Chronic kidney disease, stage 3a: Secondary | ICD-10-CM | POA: Diagnosis not present

## 2023-11-23 DIAGNOSIS — J479 Bronchiectasis, uncomplicated: Secondary | ICD-10-CM | POA: Diagnosis not present

## 2023-11-24 ENCOUNTER — Inpatient Hospital Stay (INDEPENDENT_AMBULATORY_CARE_PROVIDER_SITE_OTHER)

## 2023-11-24 DIAGNOSIS — I5032 Chronic diastolic (congestive) heart failure: Secondary | ICD-10-CM

## 2023-11-24 DIAGNOSIS — I25118 Atherosclerotic heart disease of native coronary artery with other forms of angina pectoris: Secondary | ICD-10-CM

## 2023-11-24 NOTE — Progress Notes (Signed)
 EPIC Encounter for ICM Monitoring  Patient Name: Roger Lowe. is a 78 y.o. male Date: 11/24/2023 Primary Care Physican: Benita Stabile, MD Primary Cardiologist: Branch Electrophysiologist: Ladona Ridgel BiV Pacing: 98% 02/11/2023 Weight: 220 lbs 07/03/2023 Weight: 214 lbs 09/09/2023 Office Weight: 218 lbs 11/17/2023 Weight:  189 lbs          Spoke with patient and heart failure questions reviewed.  Transmission results reviewed.  Pt asymptomatic for fluid accumulation.  He has urology appointment tomorrow, 3/26.     CorVue thoracic impedance suggesting fluid levels returned to normal.   Prescribed: Furosemide 40 mg take 1 tablet daily. Spironolactone 25 mg take 1 tablet daily   Labs: 10/30/2023 Creatinine 1.34, BUN 18, Potassium 4.8, Sodium 134, GFR 55 10/28/2023 Creatinine 1.46, BUN 20, Potassium 4.0, Sodium 137, GFR 50 10/22/2023 Creatinine 1.24, BUN 18, Potassium 4.2, Sodium 136, GFR >60  10/21/2023 Creatinine 1.56, BUN 17, Potassium 3.6, Sodium 135, GFR 46  10/20/2023 Creatinine 1.19, BUN 12, Potassium 4.3, Sodium 134, GFR >60  10/19/2023 Creatinine 1.29, BUN 10, Potassium 4.3, Sodium 137, GFR 58 (11:46 AM) 10/19/2023 Creatinine 1.23, BUN 10, Potassium 4.1, Sodium 136, GFR >60 (9:58 AM) A complete set of results can be found in Results Review.   Recommendations:  No changes and encouraged to call if experiencing any fluid symptoms.   Follow-up plan: ICM clinic phone appointment on 12/21/2023.   91 day device clinic remote transmission 02/15/2024.     EP/Cardiology Office Visits:  12/10/2023 with Dr Wyline Mood.   Recall 09/03/2024 with Dr. Ladona Ridgel.     Copy of ICM check sent to Dr. Ladona Ridgel.   3 month ICM trend: 11/24/2023.    12-14 Month ICM trend:     Karie Soda, RN 11/24/2023 10:21 AM

## 2023-11-25 ENCOUNTER — Encounter: Payer: Self-pay | Admitting: Urology

## 2023-11-25 ENCOUNTER — Ambulatory Visit (INDEPENDENT_AMBULATORY_CARE_PROVIDER_SITE_OTHER): Payer: Medicare HMO | Admitting: Urology

## 2023-11-25 VITALS — BP 126/74 | HR 103

## 2023-11-25 DIAGNOSIS — N281 Cyst of kidney, acquired: Secondary | ICD-10-CM | POA: Insufficient documentation

## 2023-11-25 DIAGNOSIS — R972 Elevated prostate specific antigen [PSA]: Secondary | ICD-10-CM

## 2023-11-25 DIAGNOSIS — N401 Enlarged prostate with lower urinary tract symptoms: Secondary | ICD-10-CM | POA: Diagnosis not present

## 2023-11-25 LAB — URINALYSIS, ROUTINE W REFLEX MICROSCOPIC
Bilirubin, UA: NEGATIVE
Ketones, UA: NEGATIVE
Leukocytes,UA: NEGATIVE
Nitrite, UA: NEGATIVE
RBC, UA: NEGATIVE
Specific Gravity, UA: 1.025 (ref 1.005–1.030)
Urobilinogen, Ur: 0.2 mg/dL (ref 0.2–1.0)
pH, UA: 6 (ref 5.0–7.5)

## 2023-11-25 LAB — BLADDER SCAN AMB NON-IMAGING: Scan Result: 27
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
## 2023-11-25 NOTE — Progress Notes (Signed)
 Bladder Scan completed today.  Patient can void prior to the bladder scan. Bladder scan result: 27  Performed By: Alfonse Spruce. CMA  Additional notes-

## 2023-11-26 DIAGNOSIS — H9202 Otalgia, left ear: Secondary | ICD-10-CM | POA: Diagnosis not present

## 2023-11-26 DIAGNOSIS — H9209 Otalgia, unspecified ear: Secondary | ICD-10-CM | POA: Diagnosis not present

## 2023-11-27 DIAGNOSIS — J479 Bronchiectasis, uncomplicated: Secondary | ICD-10-CM | POA: Diagnosis not present

## 2023-11-27 DIAGNOSIS — I5022 Chronic systolic (congestive) heart failure: Secondary | ICD-10-CM | POA: Diagnosis not present

## 2023-11-27 DIAGNOSIS — I130 Hypertensive heart and chronic kidney disease with heart failure and stage 1 through stage 4 chronic kidney disease, or unspecified chronic kidney disease: Secondary | ICD-10-CM | POA: Diagnosis not present

## 2023-11-27 DIAGNOSIS — I447 Left bundle-branch block, unspecified: Secondary | ICD-10-CM | POA: Diagnosis not present

## 2023-11-27 DIAGNOSIS — J9621 Acute and chronic respiratory failure with hypoxia: Secondary | ICD-10-CM | POA: Diagnosis not present

## 2023-11-27 DIAGNOSIS — N390 Urinary tract infection, site not specified: Secondary | ICD-10-CM | POA: Diagnosis not present

## 2023-11-27 DIAGNOSIS — E1122 Type 2 diabetes mellitus with diabetic chronic kidney disease: Secondary | ICD-10-CM | POA: Diagnosis not present

## 2023-11-27 DIAGNOSIS — J449 Chronic obstructive pulmonary disease, unspecified: Secondary | ICD-10-CM | POA: Diagnosis not present

## 2023-11-27 DIAGNOSIS — N1831 Chronic kidney disease, stage 3a: Secondary | ICD-10-CM | POA: Diagnosis not present

## 2023-11-30 DIAGNOSIS — J449 Chronic obstructive pulmonary disease, unspecified: Secondary | ICD-10-CM | POA: Diagnosis not present

## 2023-11-30 DIAGNOSIS — N1831 Chronic kidney disease, stage 3a: Secondary | ICD-10-CM | POA: Diagnosis not present

## 2023-11-30 DIAGNOSIS — E1122 Type 2 diabetes mellitus with diabetic chronic kidney disease: Secondary | ICD-10-CM | POA: Diagnosis not present

## 2023-11-30 DIAGNOSIS — N390 Urinary tract infection, site not specified: Secondary | ICD-10-CM | POA: Diagnosis not present

## 2023-11-30 DIAGNOSIS — I130 Hypertensive heart and chronic kidney disease with heart failure and stage 1 through stage 4 chronic kidney disease, or unspecified chronic kidney disease: Secondary | ICD-10-CM | POA: Diagnosis not present

## 2023-11-30 DIAGNOSIS — I447 Left bundle-branch block, unspecified: Secondary | ICD-10-CM | POA: Diagnosis not present

## 2023-11-30 DIAGNOSIS — J9621 Acute and chronic respiratory failure with hypoxia: Secondary | ICD-10-CM | POA: Diagnosis not present

## 2023-11-30 DIAGNOSIS — I5022 Chronic systolic (congestive) heart failure: Secondary | ICD-10-CM | POA: Diagnosis not present

## 2023-11-30 DIAGNOSIS — J479 Bronchiectasis, uncomplicated: Secondary | ICD-10-CM | POA: Diagnosis not present

## 2023-12-01 DIAGNOSIS — I130 Hypertensive heart and chronic kidney disease with heart failure and stage 1 through stage 4 chronic kidney disease, or unspecified chronic kidney disease: Secondary | ICD-10-CM | POA: Diagnosis not present

## 2023-12-01 DIAGNOSIS — J479 Bronchiectasis, uncomplicated: Secondary | ICD-10-CM | POA: Diagnosis not present

## 2023-12-01 DIAGNOSIS — E1122 Type 2 diabetes mellitus with diabetic chronic kidney disease: Secondary | ICD-10-CM | POA: Diagnosis not present

## 2023-12-01 DIAGNOSIS — J9621 Acute and chronic respiratory failure with hypoxia: Secondary | ICD-10-CM | POA: Diagnosis not present

## 2023-12-01 DIAGNOSIS — J449 Chronic obstructive pulmonary disease, unspecified: Secondary | ICD-10-CM | POA: Diagnosis not present

## 2023-12-01 DIAGNOSIS — I447 Left bundle-branch block, unspecified: Secondary | ICD-10-CM | POA: Diagnosis not present

## 2023-12-01 DIAGNOSIS — N1831 Chronic kidney disease, stage 3a: Secondary | ICD-10-CM | POA: Diagnosis not present

## 2023-12-01 DIAGNOSIS — N390 Urinary tract infection, site not specified: Secondary | ICD-10-CM | POA: Diagnosis not present

## 2023-12-01 DIAGNOSIS — I5022 Chronic systolic (congestive) heart failure: Secondary | ICD-10-CM | POA: Diagnosis not present

## 2023-12-03 ENCOUNTER — Other Ambulatory Visit: Payer: Self-pay | Admitting: Pulmonary Disease

## 2023-12-03 DIAGNOSIS — H524 Presbyopia: Secondary | ICD-10-CM | POA: Diagnosis not present

## 2023-12-04 ENCOUNTER — Telehealth: Payer: Self-pay | Admitting: Primary Care

## 2023-12-04 NOTE — Telephone Encounter (Signed)
 Rec'f fax from [facility] for Ipratropium. Needs Ms. Walsh's signature. Put in red folder for signature.

## 2023-12-04 NOTE — Telephone Encounter (Signed)
 Rec'd signed copy. Will fax.

## 2023-12-04 NOTE — Telephone Encounter (Signed)
 Attached fax confirmation. Put in scan folder

## 2023-12-07 DIAGNOSIS — M25562 Pain in left knee: Secondary | ICD-10-CM | POA: Diagnosis not present

## 2023-12-07 DIAGNOSIS — M25569 Pain in unspecified knee: Secondary | ICD-10-CM | POA: Diagnosis not present

## 2023-12-07 DIAGNOSIS — G894 Chronic pain syndrome: Secondary | ICD-10-CM | POA: Diagnosis not present

## 2023-12-07 DIAGNOSIS — I2510 Atherosclerotic heart disease of native coronary artery without angina pectoris: Secondary | ICD-10-CM | POA: Diagnosis not present

## 2023-12-07 DIAGNOSIS — M25561 Pain in right knee: Secondary | ICD-10-CM | POA: Diagnosis not present

## 2023-12-07 DIAGNOSIS — M25551 Pain in right hip: Secondary | ICD-10-CM | POA: Diagnosis not present

## 2023-12-07 DIAGNOSIS — R079 Chest pain, unspecified: Secondary | ICD-10-CM | POA: Diagnosis not present

## 2023-12-07 DIAGNOSIS — C3490 Malignant neoplasm of unspecified part of unspecified bronchus or lung: Secondary | ICD-10-CM | POA: Diagnosis not present

## 2023-12-07 DIAGNOSIS — I5022 Chronic systolic (congestive) heart failure: Secondary | ICD-10-CM | POA: Diagnosis not present

## 2023-12-08 DIAGNOSIS — N390 Urinary tract infection, site not specified: Secondary | ICD-10-CM | POA: Diagnosis not present

## 2023-12-08 DIAGNOSIS — I5022 Chronic systolic (congestive) heart failure: Secondary | ICD-10-CM | POA: Diagnosis not present

## 2023-12-08 DIAGNOSIS — J9621 Acute and chronic respiratory failure with hypoxia: Secondary | ICD-10-CM | POA: Diagnosis not present

## 2023-12-08 DIAGNOSIS — I130 Hypertensive heart and chronic kidney disease with heart failure and stage 1 through stage 4 chronic kidney disease, or unspecified chronic kidney disease: Secondary | ICD-10-CM | POA: Diagnosis not present

## 2023-12-08 DIAGNOSIS — E1122 Type 2 diabetes mellitus with diabetic chronic kidney disease: Secondary | ICD-10-CM | POA: Diagnosis not present

## 2023-12-08 DIAGNOSIS — I447 Left bundle-branch block, unspecified: Secondary | ICD-10-CM | POA: Diagnosis not present

## 2023-12-08 DIAGNOSIS — N1831 Chronic kidney disease, stage 3a: Secondary | ICD-10-CM | POA: Diagnosis not present

## 2023-12-08 DIAGNOSIS — J449 Chronic obstructive pulmonary disease, unspecified: Secondary | ICD-10-CM | POA: Diagnosis not present

## 2023-12-08 DIAGNOSIS — J479 Bronchiectasis, uncomplicated: Secondary | ICD-10-CM | POA: Diagnosis not present

## 2023-12-09 DIAGNOSIS — N390 Urinary tract infection, site not specified: Secondary | ICD-10-CM | POA: Diagnosis not present

## 2023-12-09 DIAGNOSIS — E1122 Type 2 diabetes mellitus with diabetic chronic kidney disease: Secondary | ICD-10-CM | POA: Diagnosis not present

## 2023-12-09 DIAGNOSIS — J479 Bronchiectasis, uncomplicated: Secondary | ICD-10-CM | POA: Diagnosis not present

## 2023-12-09 DIAGNOSIS — J9621 Acute and chronic respiratory failure with hypoxia: Secondary | ICD-10-CM | POA: Diagnosis not present

## 2023-12-09 DIAGNOSIS — I447 Left bundle-branch block, unspecified: Secondary | ICD-10-CM | POA: Diagnosis not present

## 2023-12-09 DIAGNOSIS — I5022 Chronic systolic (congestive) heart failure: Secondary | ICD-10-CM | POA: Diagnosis not present

## 2023-12-09 DIAGNOSIS — J449 Chronic obstructive pulmonary disease, unspecified: Secondary | ICD-10-CM | POA: Diagnosis not present

## 2023-12-09 DIAGNOSIS — I130 Hypertensive heart and chronic kidney disease with heart failure and stage 1 through stage 4 chronic kidney disease, or unspecified chronic kidney disease: Secondary | ICD-10-CM | POA: Diagnosis not present

## 2023-12-09 DIAGNOSIS — N1831 Chronic kidney disease, stage 3a: Secondary | ICD-10-CM | POA: Diagnosis not present

## 2023-12-10 ENCOUNTER — Encounter: Payer: Self-pay | Admitting: Cardiology

## 2023-12-10 ENCOUNTER — Ambulatory Visit: Payer: Medicare HMO | Attending: Cardiology | Admitting: Cardiology

## 2023-12-10 VITALS — BP 104/60 | HR 96 | Ht 72.0 in | Wt 200.0 lb

## 2023-12-10 DIAGNOSIS — I25118 Atherosclerotic heart disease of native coronary artery with other forms of angina pectoris: Secondary | ICD-10-CM | POA: Diagnosis not present

## 2023-12-10 DIAGNOSIS — I5032 Chronic diastolic (congestive) heart failure: Secondary | ICD-10-CM

## 2023-12-10 DIAGNOSIS — J9621 Acute and chronic respiratory failure with hypoxia: Secondary | ICD-10-CM | POA: Diagnosis not present

## 2023-12-10 DIAGNOSIS — I10 Essential (primary) hypertension: Secondary | ICD-10-CM

## 2023-12-10 DIAGNOSIS — J479 Bronchiectasis, uncomplicated: Secondary | ICD-10-CM | POA: Diagnosis not present

## 2023-12-10 DIAGNOSIS — I130 Hypertensive heart and chronic kidney disease with heart failure and stage 1 through stage 4 chronic kidney disease, or unspecified chronic kidney disease: Secondary | ICD-10-CM | POA: Diagnosis not present

## 2023-12-10 DIAGNOSIS — J449 Chronic obstructive pulmonary disease, unspecified: Secondary | ICD-10-CM | POA: Diagnosis not present

## 2023-12-10 DIAGNOSIS — N390 Urinary tract infection, site not specified: Secondary | ICD-10-CM | POA: Diagnosis not present

## 2023-12-10 DIAGNOSIS — E782 Mixed hyperlipidemia: Secondary | ICD-10-CM

## 2023-12-10 DIAGNOSIS — I5022 Chronic systolic (congestive) heart failure: Secondary | ICD-10-CM | POA: Diagnosis not present

## 2023-12-10 DIAGNOSIS — E1122 Type 2 diabetes mellitus with diabetic chronic kidney disease: Secondary | ICD-10-CM | POA: Diagnosis not present

## 2023-12-10 DIAGNOSIS — I471 Supraventricular tachycardia, unspecified: Secondary | ICD-10-CM

## 2023-12-10 DIAGNOSIS — N1831 Chronic kidney disease, stage 3a: Secondary | ICD-10-CM | POA: Diagnosis not present

## 2023-12-10 DIAGNOSIS — I447 Left bundle-branch block, unspecified: Secondary | ICD-10-CM | POA: Diagnosis not present

## 2023-12-10 MED ORDER — ATORVASTATIN CALCIUM 80 MG PO TABS: 80.0000 mg | ORAL_TABLET | Freq: Every day | ORAL | 3 refills | Status: AC
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
## 2023-12-10 NOTE — Patient Instructions (Signed)
 Medication Instructions:  Your physician has recommended you make the following change in your medication:   -Increase Atorvastatin to 80 mg once daily. -Increase Lasix to 40 mg twice daily for 3 days, THEN decrease to 40 mg once daily   *If you need a refill on your cardiac medications before your next appointment, please call your pharmacy*  Lab Work: None If you have labs (blood work) drawn today and your tests are completely normal, you will receive your results only by: MyChart Message (if you have MyChart) OR A paper copy in the mail If you have any lab test that is abnormal or we need to change your treatment, we will call you to review the results.  Testing/Procedures: None  Follow-Up: At [facility], you and your health needs are our priority.  As part of our continuing mission to provide you with exceptional heart care, our providers are all part of one team.  This team includes your primary Cardiologist (physician) and Advanced Practice Providers or APPs (Physician Assistants and Nurse Practitioners) who all work together to provide you with the care you need, when you need it.  Your next appointment:    Keep follow up scheduled   Provider:   Sharlene Dory, NP    We recommend signing up for the patient portal called "MyChart".  Sign up information is provided on this After Visit Summary.  MyChart is used to connect with patients for Virtual Visits (Telemedicine).  Patients are able to view lab/test results, encounter notes, upcoming appointments, etc.  Non-urgent messages can be sent to your provider as well.   To learn more about what you can do with MyChart, go to [email or web address].   Other Instructions

## 2023-12-15 DIAGNOSIS — E1122 Type 2 diabetes mellitus with diabetic chronic kidney disease: Secondary | ICD-10-CM | POA: Diagnosis not present

## 2023-12-15 DIAGNOSIS — I130 Hypertensive heart and chronic kidney disease with heart failure and stage 1 through stage 4 chronic kidney disease, or unspecified chronic kidney disease: Secondary | ICD-10-CM | POA: Diagnosis not present

## 2023-12-15 DIAGNOSIS — J449 Chronic obstructive pulmonary disease, unspecified: Secondary | ICD-10-CM | POA: Diagnosis not present

## 2023-12-15 DIAGNOSIS — I447 Left bundle-branch block, unspecified: Secondary | ICD-10-CM | POA: Diagnosis not present

## 2023-12-15 DIAGNOSIS — N390 Urinary tract infection, site not specified: Secondary | ICD-10-CM | POA: Diagnosis not present

## 2023-12-15 DIAGNOSIS — J9621 Acute and chronic respiratory failure with hypoxia: Secondary | ICD-10-CM | POA: Diagnosis not present

## 2023-12-15 DIAGNOSIS — I5022 Chronic systolic (congestive) heart failure: Secondary | ICD-10-CM | POA: Diagnosis not present

## 2023-12-15 DIAGNOSIS — N1831 Chronic kidney disease, stage 3a: Secondary | ICD-10-CM | POA: Diagnosis not present

## 2023-12-15 DIAGNOSIS — J479 Bronchiectasis, uncomplicated: Secondary | ICD-10-CM | POA: Diagnosis not present

## 2023-12-17 NOTE — Telephone Encounter (Signed)
 NFN

## 2023-12-21 ENCOUNTER — Inpatient Hospital Stay: Attending: Internal Medicine

## 2023-12-21 DIAGNOSIS — I5032 Chronic diastolic (congestive) heart failure: Secondary | ICD-10-CM | POA: Diagnosis not present

## 2023-12-21 DIAGNOSIS — J449 Chronic obstructive pulmonary disease, unspecified: Secondary | ICD-10-CM | POA: Diagnosis not present

## 2023-12-21 DIAGNOSIS — N390 Urinary tract infection, site not specified: Secondary | ICD-10-CM | POA: Diagnosis not present

## 2023-12-21 DIAGNOSIS — I447 Left bundle-branch block, unspecified: Secondary | ICD-10-CM | POA: Diagnosis not present

## 2023-12-21 DIAGNOSIS — J479 Bronchiectasis, uncomplicated: Secondary | ICD-10-CM | POA: Diagnosis not present

## 2023-12-21 DIAGNOSIS — I130 Hypertensive heart and chronic kidney disease with heart failure and stage 1 through stage 4 chronic kidney disease, or unspecified chronic kidney disease: Secondary | ICD-10-CM | POA: Diagnosis not present

## 2023-12-21 DIAGNOSIS — J9621 Acute and chronic respiratory failure with hypoxia: Secondary | ICD-10-CM | POA: Diagnosis not present

## 2023-12-21 DIAGNOSIS — E1122 Type 2 diabetes mellitus with diabetic chronic kidney disease: Secondary | ICD-10-CM | POA: Diagnosis not present

## 2023-12-21 DIAGNOSIS — Z95810 Presence of automatic (implantable) cardiac defibrillator: Secondary | ICD-10-CM

## 2023-12-21 DIAGNOSIS — I5022 Chronic systolic (congestive) heart failure: Secondary | ICD-10-CM | POA: Diagnosis not present

## 2023-12-21 DIAGNOSIS — N1831 Chronic kidney disease, stage 3a: Secondary | ICD-10-CM | POA: Diagnosis not present

## 2023-12-21 NOTE — Progress Notes (Unsigned)
 EPIC Encounter for ICM Monitoring  Patient Name: Roger Lowe. is a 78 y.o. male Date: 12/21/2023 Primary Care Physican: Omie Bickers, MD Primary Cardiologist: Branch Electrophysiologist: Carolynne Citron BiV Pacing: 98% 02/11/2023 Weight: 220 lbs 07/03/2023 Weight: 214 lbs 09/09/2023 Office Weight: 218 lbs 11/17/2023 Weight:  189 lbs  12/21/2023 Weight: 200 lbs        Spoke with patient and heart failure questions reviewed.  Transmission results reviewed.  Pt reports intermittent leg swelling leg edema and weight has increased over the last couple of weeks.          Diet:  His appetite has improved and eating better so he not sure how much of weight gain if fluid or caloric weight.   He is not strict on salt intake.   CorVue thoracic impedance suggesting possible fluid accumulation starting 4/5.  Also suggesting possible fluid accumulation from 3/28-4/3.   Prescribed: Furosemide  40 mg take 1 tablet daily.  4/21 Reports compliance with Lasix . Spironolactone  25 mg take 1 tablet daily   Labs: 10/30/2023 Creatinine 1.34, BUN 18, Potassium 4.8, Sodium 134, GFR 55 10/28/2023 Creatinine 1.46, BUN 20, Potassium 4.0, Sodium 137, GFR 50 10/22/2023 Creatinine 1.24, BUN 18, Potassium 4.2, Sodium 136, GFR >60  10/21/2023 Creatinine 1.56, BUN 17, Potassium 3.6, Sodium 135, GFR 46  10/20/2023 Creatinine 1.19, BUN 12, Potassium 4.3, Sodium 134, GFR >60  10/19/2023 Creatinine 1.29, BUN 10, Potassium 4.3, Sodium 137, GFR 58 (11:46 AM) 10/19/2023 Creatinine 1.23, BUN 10, Potassium 4.1, Sodium 136, GFR >60 (9:58 AM) A complete set of results can be found in Results Review.   Recommendations:  Copy sent to Dr Amanda Jungling for review and recommendations if needed.   Follow-up plan: ICM clinic phone appointment on 12/28/2023 to recheck fluid levels.   91 day device clinic remote transmission 02/15/2024.     EP/Cardiology Office Visits:  No recall for next general cardiologist visit (last visit was 12/10/2023 with Dr Amanda Jungling).    Recall 09/03/2024 with Dr. Carolynne Citron.     Copy of ICM check sent to Dr. Carolynne Citron.    3 month ICM trend: 12/20/2023.    12-14 Month ICM trend:     Almyra Jain, RN 12/21/2023 11:33 AM

## 2023-12-22 NOTE — Progress Notes (Signed)
 Can we increase lasix  to 60mg  daily, please order a bmet for 2 weeks please  Letta Raw MD

## 2023-12-23 ENCOUNTER — Other Ambulatory Visit: Payer: Self-pay

## 2023-12-23 DIAGNOSIS — Z95810 Presence of automatic (implantable) cardiac defibrillator: Secondary | ICD-10-CM

## 2023-12-23 DIAGNOSIS — I5032 Chronic diastolic (congestive) heart failure: Secondary | ICD-10-CM

## 2023-12-23 MED ORDER — FUROSEMIDE 20 MG PO TABS: 60.0000 mg | ORAL_TABLET | Freq: Every day | ORAL | 3 refills | Status: DC

## 2023-12-23 MED ORDER — FUROSEMIDE 20 MG PO TABS
60.0000 mg | ORAL_TABLET | Freq: Every day | ORAL | 3 refills | Status: AC
Start: 2023-12-23 — End: 2024-03-22

## 2023-12-23 NOTE — Progress Notes (Signed)
  Attempted call to patient to provide Dr Junita Oliva recommendation to increase Lasix  to 60 mg daily and BMET in 2 weeks (5/5 or 5/6).

## 2023-12-23 NOTE — Progress Notes (Signed)
 Spoke with patient.  Advised Dr. Amanda Jungling recommended to increase Furosemide  to 60 mg daily.  BMET Lab needed in 2 weeks.  Prescription updated in EPIC and lab order placed.  Advised patient he can go to [facility] on [address] in [place] on the same days he has a device office visit and he agreed.  Pt needed prescription refill and sent to [facility] as requested.  Advised to call back for any questions.

## 2023-12-23 NOTE — Progress Notes (Signed)
 Attempted call to patient to provide Dr Junita Oliva recommendation to increase Lasix  to 60 mg daily and BMET in 2 weeks (5/5 or 5/6).

## 2023-12-24 ENCOUNTER — Other Ambulatory Visit: Payer: Self-pay

## 2023-12-24 DIAGNOSIS — I5032 Chronic diastolic (congestive) heart failure: Secondary | ICD-10-CM

## 2023-12-24 DIAGNOSIS — Z95810 Presence of automatic (implantable) cardiac defibrillator: Secondary | ICD-10-CM

## 2023-12-24 NOTE — Addendum Note (Signed)
 Addended by: Almyra Jain on: 12/24/2023 07:42 AM   Modules accepted: Orders

## 2023-12-28 ENCOUNTER — Telehealth: Payer: Self-pay

## 2023-12-28 ENCOUNTER — Inpatient Hospital Stay

## 2023-12-28 DIAGNOSIS — I5032 Chronic diastolic (congestive) heart failure: Secondary | ICD-10-CM

## 2023-12-28 DIAGNOSIS — Z95810 Presence of automatic (implantable) cardiac defibrillator: Secondary | ICD-10-CM

## 2023-12-28 NOTE — Progress Notes (Signed)
 EPIC Encounter for ICM Monitoring  Patient Name: Roger Lowe. is a 78 y.o. male Date: 12/28/2023 Primary Care Physican: Omie Bickers, MD Primary Cardiologist: Branch Electrophysiologist: Carolynne Citron BiV Pacing: 98% 02/11/2023 Weight: 220 lbs 07/03/2023 Weight: 214 lbs 09/09/2023 Office Weight: 218 lbs 11/17/2023 Weight:  189 lbs  12/21/2023 Weight: 200 lbs         Attempted call to patient and unable to reach.    Transmission results reviewed.            Diet:  His appetite has improved and eating better so he not sure how much of weight gain if fluid or caloric weight.   He is not strict on salt intake.   CorVue thoracic impedance suggesting fluid levels returned to normal after Lasix  increased to 60 mg daily on 4/23.     Prescribed: Furosemide  40 mg take 1 tablet daily.  4/21 Reports compliance with Lasix . Spironolactone  25 mg take 1 tablet daily   Labs: 01/04/2024 BMET can be drawn at [facility] following device clinic appt (ordered by Dr Amanda Jungling after Lasix  increase on 4/23) 10/30/2023 Creatinine 1.34, BUN 18, Potassium 4.8, Sodium 134, GFR 55 10/28/2023 Creatinine 1.46, BUN 20, Potassium 4.0, Sodium 137, GFR 50 10/22/2023 Creatinine 1.24, BUN 18, Potassium 4.2, Sodium 136, GFR >60  10/21/2023 Creatinine 1.56, BUN 17, Potassium 3.6, Sodium 135, GFR 46  10/20/2023 Creatinine 1.19, BUN 12, Potassium 4.3, Sodium 134, GFR >60  10/19/2023 Creatinine 1.29, BUN 10, Potassium 4.3, Sodium 137, GFR 58 (11:46 AM) 10/19/2023 Creatinine 1.23, BUN 10, Potassium 4.1, Sodium 136, GFR >60 (9:58 AM) A complete set of results can be found in Results Review.   Recommendations:  Unable to reach.     Follow-up plan: ICM clinic phone appointment on 01/11/2024 to check stability of fluid levels.   91 day device clinic remote transmission 02/15/2024.     EP/Cardiology Office Visits:   01/04/2024 Device clinic appt at [place] office.   No recall for next general cardiologist visit (last visit was 12/10/2023 with  Dr Amanda Jungling).   Recall 09/03/2024 with Dr. Carolynne Citron.     Copy of ICM check sent to Dr. Carolynne Citron.    3 month ICM trend: 12/27/2023.    12-14 Month ICM trend:     Almyra Jain, RN 12/28/2023 3:52 PM

## 2023-12-28 NOTE — Telephone Encounter (Signed)
 Remote ICM transmission received.  Attempted call to patient regarding ICM remote transmission and no answer.

## 2023-12-29 DIAGNOSIS — I10 Essential (primary) hypertension: Secondary | ICD-10-CM | POA: Diagnosis not present

## 2023-12-29 DIAGNOSIS — R109 Unspecified abdominal pain: Secondary | ICD-10-CM | POA: Diagnosis not present

## 2023-12-29 DIAGNOSIS — G8929 Other chronic pain: Secondary | ICD-10-CM | POA: Diagnosis not present

## 2023-12-29 DIAGNOSIS — Z66 Do not resuscitate: Secondary | ICD-10-CM | POA: Diagnosis not present

## 2023-12-29 DIAGNOSIS — E1165 Type 2 diabetes mellitus with hyperglycemia: Secondary | ICD-10-CM | POA: Diagnosis not present

## 2023-12-29 DIAGNOSIS — R1011 Right upper quadrant pain: Secondary | ICD-10-CM | POA: Diagnosis not present

## 2023-12-30 DIAGNOSIS — N390 Urinary tract infection, site not specified: Secondary | ICD-10-CM | POA: Diagnosis not present

## 2023-12-30 DIAGNOSIS — I447 Left bundle-branch block, unspecified: Secondary | ICD-10-CM | POA: Diagnosis not present

## 2023-12-30 DIAGNOSIS — I5022 Chronic systolic (congestive) heart failure: Secondary | ICD-10-CM | POA: Diagnosis not present

## 2023-12-30 DIAGNOSIS — J479 Bronchiectasis, uncomplicated: Secondary | ICD-10-CM | POA: Diagnosis not present

## 2023-12-30 DIAGNOSIS — J9621 Acute and chronic respiratory failure with hypoxia: Secondary | ICD-10-CM | POA: Diagnosis not present

## 2023-12-30 DIAGNOSIS — E1122 Type 2 diabetes mellitus with diabetic chronic kidney disease: Secondary | ICD-10-CM | POA: Diagnosis not present

## 2023-12-30 DIAGNOSIS — N1831 Chronic kidney disease, stage 3a: Secondary | ICD-10-CM | POA: Diagnosis not present

## 2023-12-30 DIAGNOSIS — J449 Chronic obstructive pulmonary disease, unspecified: Secondary | ICD-10-CM | POA: Diagnosis not present

## 2023-12-30 DIAGNOSIS — I130 Hypertensive heart and chronic kidney disease with heart failure and stage 1 through stage 4 chronic kidney disease, or unspecified chronic kidney disease: Secondary | ICD-10-CM | POA: Diagnosis not present

## 2023-12-30 NOTE — Progress Notes (Signed)
 Attempted call to patient to provide transmission results and advise patient to use [facility] on Monday for lab work.  Unable to reach.

## 2023-12-30 NOTE — Addendum Note (Signed)
 Addended by: Edra Govern D on: 12/30/2023 09:36 AM   Modules accepted: Orders

## 2023-12-30 NOTE — Progress Notes (Signed)
 Remote ICD transmission.

## 2024-01-04 ENCOUNTER — Other Ambulatory Visit (HOSPITAL_COMMUNITY)
Admission: RE | Admit: 2024-01-04 | Discharge: 2024-01-04 | Disposition: A | Discharge status: Home / Self Care | Source: Ambulatory Visit | Attending: Cardiology | Admitting: Cardiology

## 2024-01-04 ENCOUNTER — Inpatient Hospital Stay: Attending: Internal Medicine

## 2024-01-04 DIAGNOSIS — I447 Left bundle-branch block, unspecified: Secondary | ICD-10-CM

## 2024-01-04 DIAGNOSIS — Z95810 Presence of automatic (implantable) cardiac defibrillator: Secondary | ICD-10-CM | POA: Insufficient documentation

## 2024-01-04 DIAGNOSIS — I5032 Chronic diastolic (congestive) heart failure: Secondary | ICD-10-CM | POA: Diagnosis not present

## 2024-01-04 LAB — CUP PACEART INCLINIC DEVICE CHECK
Date Time Interrogation Session: 20250505135232
Implantable Lead Connection Status: 753985
Implantable Lead Connection Status: 753985
Implantable Lead Connection Status: 753985
Implantable Lead Implant Date: 20210308
Implantable Lead Implant Date: 20210308
Implantable Lead Implant Date: 20210308
Implantable Lead Location: 753858
Implantable Lead Location: 753859
Implantable Lead Location: 753860
Implantable Lead Model: 7122
Implantable Pulse Generator Implant Date: 20210308
Pulse Gen Serial Number: 111019088

## 2024-01-04 LAB — BASIC METABOLIC PANEL WITH GFR
Anion gap: 12 (ref 5–15)
BUN: 9 mg/dL (ref 8–23)
CO2: 24 mmol/L (ref 22–32)
Calcium: 9.6 mg/dL (ref 8.9–10.3)
Chloride: 100 mmol/L (ref 98–111)
Creatinine, Ser: 1.18 mg/dL (ref 0.61–1.24)
GFR, Estimated: >60 mL/min (ref >60)
Glucose, Bld: 142 mg/dL — ABNORMAL HIGH (ref 70–99)
Potassium: 3.9 mmol/L (ref 3.5–5.1)
Sodium: 136 mmol/L (ref 135–145)

## 2024-01-04 NOTE — Addendum Note (Signed)
 Addended by: Almyra Jain on: 01/04/2024 09:27 AM   Modules accepted: Orders

## 2024-01-04 NOTE — Progress Notes (Signed)
 Patient seen in device clinic/RDS to extend [facility]. See below for programming changes that were advised per Dr. Carolynne Citron. Error in saving PDF. See scanned media.   MTR programmed from 130 bpm to 120 bpm. PMT Detection Rate progammed from 130 bpm to 100 bpm. PVARP programmed from 325 ms to 350 ms.  *Was unable to program PVARP to 375 ms to [place]ke[place].

## 2024-01-08 DIAGNOSIS — E1165 Type 2 diabetes mellitus with hyperglycemia: Secondary | ICD-10-CM | POA: Diagnosis not present

## 2024-01-08 DIAGNOSIS — E119 Type 2 diabetes mellitus without complications: Secondary | ICD-10-CM | POA: Diagnosis not present

## 2024-01-11 ENCOUNTER — Inpatient Hospital Stay (INDEPENDENT_AMBULATORY_CARE_PROVIDER_SITE_OTHER)

## 2024-01-11 DIAGNOSIS — Z95810 Presence of automatic (implantable) cardiac defibrillator: Secondary | ICD-10-CM

## 2024-01-11 DIAGNOSIS — J449 Chronic obstructive pulmonary disease, unspecified: Secondary | ICD-10-CM | POA: Diagnosis not present

## 2024-01-11 DIAGNOSIS — I5022 Chronic systolic (congestive) heart failure: Secondary | ICD-10-CM

## 2024-01-13 ENCOUNTER — Other Ambulatory Visit: Payer: Self-pay | Admitting: Pulmonary Disease

## 2024-01-13 NOTE — Progress Notes (Signed)
 EPIC Encounter for ICM Monitoring  Patient Name: Roger Lowe. is a 78 y.o. male Date: 01/13/2024 Primary Care Physican: Omie Bickers, MD Primary Cardiologist: Branch Electrophysiologist: Carolynne Citron BiV Pacing: 98% 02/11/2023 Weight: 220 lbs 07/03/2023 Weight: 214 lbs 09/09/2023 Office Weight: 218 lbs 11/17/2023 Weight:  189 lbs  12/21/2023 Weight: 200 lbs         Transmission results reviewed.            Diet:  His appetite has improved and eating better so he not sure how much of weight gain if fluid or caloric weight.   He is not strict on salt intake.   CorVue thoracic impedance suggesting fluid levels continue to be normal after Lasix  increased to 60 mg daily on 4/23.     Prescribed: Furosemide  40 mg take 3 tablet(s) (60 mg total) by mouth daily. Spironolactone  25 mg take 1 tablet daily   Labs: 01/04/2024 Creatinine 1.18, BUN   9, Potassium 3.9, Sodium 136, GFR >60 10/30/2023 Creatinine 1.34, BUN 18, Potassium 4.8, Sodium 134, GFR 55 10/28/2023 Creatinine 1.46, BUN 20, Potassium 4.0, Sodium 137, GFR 50 10/22/2023 Creatinine 1.24, BUN 18, Potassium 4.2, Sodium 136, GFR >60  10/21/2023 Creatinine 1.56, BUN 17, Potassium 3.6, Sodium 135, GFR 46  10/20/2023 Creatinine 1.19, BUN 12, Potassium 4.3, Sodium 134, GFR >60  10/19/2023 Creatinine 1.29, BUN 10, Potassium 4.3, Sodium 137, GFR 58 (11:46 AM) 10/19/2023 Creatinine 1.23, BUN 10, Potassium 4.1, Sodium 136, GFR >60 (9:58 AM) A complete set of results can be found in Results Review.   Recommendations:  No changes   Follow-up plan: ICM clinic phone appointment on 01/26/2024.   91 day device clinic remote transmission 02/15/2024.     EP/Cardiology Office Visits:  No recall for next general cardiologist visit (last visit was 12/10/2023 with Dr Amanda Jungling).   Recall 09/03/2024 with Dr. Carolynne Citron.     Copy of ICM check sent to Dr. Carolynne Citron.    3 month ICM trend: 01/12/2024.    12-14 Month ICM trend:     Almyra Jain, RN 01/13/2024 8:43 AM

## 2024-01-21 ENCOUNTER — Encounter: Payer: Self-pay | Admitting: Primary Care

## 2024-01-21 ENCOUNTER — Encounter: Admitting: Primary Care

## 2024-01-26 ENCOUNTER — Inpatient Hospital Stay (INDEPENDENT_AMBULATORY_CARE_PROVIDER_SITE_OTHER)

## 2024-01-26 DIAGNOSIS — Z95810 Presence of automatic (implantable) cardiac defibrillator: Secondary | ICD-10-CM

## 2024-01-26 DIAGNOSIS — I5022 Chronic systolic (congestive) heart failure: Secondary | ICD-10-CM

## 2024-01-28 NOTE — Progress Notes (Signed)
 EPIC Encounter for ICM Monitoring  Patient Name: Roger Lowe. is a 78 y.o. male Date: 01/28/2024 Primary Care Physican: Omie Bickers, MD Primary Cardiologist: Branch Electrophysiologist: Carolynne Citron BiV Pacing: 98% 02/11/2023 Weight: 220 lbs 07/03/2023 Weight: 214 lbs 09/09/2023 Office Weight: 218 lbs 11/17/2023 Weight:  189 lbs  12/21/2023 Weight: 200 lbs         Transmission results reviewed.            Diet:  His appetite has improved and eating better so he not sure how much of weight gain if fluid or caloric weight.   He is not strict on salt intake.   CorVue thoracic impedance suggesting normal fluid levels with the exception of possible fluid accumulation from 4/23.  5/16-5/21   Prescribed: Furosemide  40 mg take 3 tablet(s) (60 mg total) by mouth daily. Spironolactone  25 mg take 1 tablet daily   Labs: 01/04/2024 Creatinine 1.18, BUN   9, Potassium 3.9, Sodium 136, GFR >60 10/30/2023 Creatinine 1.34, BUN 18, Potassium 4.8, Sodium 134, GFR 55 10/28/2023 Creatinine 1.46, BUN 20, Potassium 4.0, Sodium 137, GFR 50 10/22/2023 Creatinine 1.24, BUN 18, Potassium 4.2, Sodium 136, GFR >60  10/21/2023 Creatinine 1.56, BUN 17, Potassium 3.6, Sodium 135, GFR 46  10/20/2023 Creatinine 1.19, BUN 12, Potassium 4.3, Sodium 134, GFR >60  10/19/2023 Creatinine 1.29, BUN 10, Potassium 4.3, Sodium 137, GFR 58 (11:46 AM) 10/19/2023 Creatinine 1.23, BUN 10, Potassium 4.1, Sodium 136, GFR >60 (9:58 AM) A complete set of results can be found in Results Review.   Recommendations:  No changes   Follow-up plan: ICM clinic phone appointment on 03/14/2024.   91 day device clinic remote transmission 02/15/2024.     EP/Cardiology Office Visits:  No recall for next general cardiologist visit (last visit was 12/10/2023 with Dr Amanda Jungling).   Recall 09/03/2024 with Dr. Carolynne Citron.     Copy of ICM check sent to Dr. Carolynne Citron.     3 month ICM trend: 01/26/2024.    12-14 Month ICM trend:     Almyra Jain,  RN 01/28/2024 9:43 AM

## 2024-02-01 DIAGNOSIS — J019 Acute sinusitis, unspecified: Secondary | ICD-10-CM | POA: Diagnosis not present

## 2024-02-01 DIAGNOSIS — B9689 Other specified bacterial agents as the cause of diseases classified elsewhere: Secondary | ICD-10-CM | POA: Diagnosis not present

## 2024-02-01 DIAGNOSIS — G894 Chronic pain syndrome: Secondary | ICD-10-CM | POA: Diagnosis not present

## 2024-02-01 DIAGNOSIS — R0789 Other chest pain: Secondary | ICD-10-CM | POA: Diagnosis not present

## 2024-02-08 ENCOUNTER — Other Ambulatory Visit: Payer: Self-pay | Admitting: Urology

## 2024-02-08 DIAGNOSIS — N401 Enlarged prostate with lower urinary tract symptoms: Secondary | ICD-10-CM

## 2024-02-15 ENCOUNTER — Ambulatory Visit (INDEPENDENT_AMBULATORY_CARE_PROVIDER_SITE_OTHER): Payer: Medicare HMO

## 2024-02-15 DIAGNOSIS — I5022 Chronic systolic (congestive) heart failure: Secondary | ICD-10-CM

## 2024-02-15 DIAGNOSIS — Z95810 Presence of automatic (implantable) cardiac defibrillator: Secondary | ICD-10-CM

## 2024-02-16 LAB — CUP PACEART REMOTE DEVICE CHECK
Battery Remaining Longevity: 43 mo
Battery Remaining Percentage: 48 %
Battery Voltage: 2.93 V
Brady Statistic AP VP Percent: 1 %
Brady Statistic AP VS Percent: 1 %
Brady Statistic AS VP Percent: 98 %
Brady Statistic AS VS Percent: 1.2 %
Brady Statistic RA Percent Paced: 1 %
Date Time Interrogation Session: 20250617080454
HighPow Impedance: 70 Ohm
Implantable Lead Connection Status: 753985
Implantable Lead Connection Status: 753985
Implantable Lead Connection Status: 753985
Implantable Lead Implant Date: 20210308
Implantable Lead Implant Date: 20210308
Implantable Lead Implant Date: 20210308
Implantable Lead Location: 753858
Implantable Lead Location: 753859
Implantable Lead Location: 753860
Implantable Lead Model: 7122
Implantable Pulse Generator Implant Date: 20210308
Lead Channel Impedance Value: 360 Ohm
Lead Channel Impedance Value: 460 Ohm
Lead Channel Impedance Value: 760 Ohm
Lead Channel Pacing Threshold Amplitude: 0.625 V
Lead Channel Pacing Threshold Amplitude: 0.75 V
Lead Channel Pacing Threshold Amplitude: 1 V
Lead Channel Pacing Threshold Pulse Width: 0.5 ms
Lead Channel Pacing Threshold Pulse Width: 0.5 ms
Lead Channel Pacing Threshold Pulse Width: 0.5 ms
Lead Channel Sensing Intrinsic Amplitude: 11.8 mV
Lead Channel Sensing Intrinsic Amplitude: 4.2 mV
Lead Channel Setting Pacing Amplitude: 1.625
Lead Channel Setting Pacing Amplitude: 2 V
Lead Channel Setting Pacing Amplitude: 2.5 V
Lead Channel Setting Pacing Pulse Width: 0.5 ms
Lead Channel Setting Pacing Pulse Width: 0.5 ms
Lead Channel Setting Sensing Sensitivity: 0.5 mV
Pulse Gen Serial Number: 111019088
Zone Setting Status: 755011

## 2024-02-18 ENCOUNTER — Ambulatory Visit: Payer: Self-pay | Admitting: Internal Medicine

## 2024-02-21 DIAGNOSIS — E1065 Type 1 diabetes mellitus with hyperglycemia: Secondary | ICD-10-CM | POA: Diagnosis not present

## 2024-03-06 ENCOUNTER — Other Ambulatory Visit: Payer: Self-pay | Admitting: Urology

## 2024-03-06 DIAGNOSIS — N401 Enlarged prostate with lower urinary tract symptoms: Secondary | ICD-10-CM

## 2024-03-08 ENCOUNTER — Encounter: Payer: Self-pay | Admitting: Acute Care

## 2024-03-08 ENCOUNTER — Ambulatory Visit: Admitting: Acute Care

## 2024-03-10 ENCOUNTER — Other Ambulatory Visit: Payer: Self-pay | Admitting: Cardiology

## 2024-03-14 ENCOUNTER — Inpatient Hospital Stay: Attending: Internal Medicine

## 2024-03-14 DIAGNOSIS — Z95810 Presence of automatic (implantable) cardiac defibrillator: Secondary | ICD-10-CM | POA: Diagnosis not present

## 2024-03-14 DIAGNOSIS — I5022 Chronic systolic (congestive) heart failure: Secondary | ICD-10-CM | POA: Diagnosis not present

## 2024-03-18 ENCOUNTER — Telehealth: Payer: Self-pay

## 2024-03-18 NOTE — Progress Notes (Signed)
 EPIC Encounter for ICM Monitoring  Patient Name: Roger Lowe. is a 78 y.o. male Date: 03/18/2024 Primary Care Physican: Shona Norleen PEDLAR, MD Primary Cardiologist: Branch Electrophysiologist: Waddell BiV Pacing: >99% 02/11/2023 Weight: 220 lbs 07/03/2023 Weight: 214 lbs 09/09/2023 Office Weight: 218 lbs 11/17/2023 Weight:  189 lbs  12/21/2023 Weight: 200 lbs         Attempted call to patient and unable to reach.   Transmission results reviewed.          Diet:  His appetite has improved and eating better so he not sure how much of weight gain if fluid or caloric weight.   He is not strict on salt intake.   CorVue thoracic impedance suggesting normal fluid levels since 6/28.   Prescribed: Furosemide  40 mg take 3 tablet(s) (60 mg total) by mouth daily. Spironolactone  25 mg take 1 tablet daily   Labs: 01/04/2024 Creatinine 1.18, BUN   9, Potassium 3.9, Sodium 136, GFR >60 10/30/2023 Creatinine 1.34, BUN 18, Potassium 4.8, Sodium 134, GFR 55 10/28/2023 Creatinine 1.46, BUN 20, Potassium 4.0, Sodium 137, GFR 50 10/22/2023 Creatinine 1.24, BUN 18, Potassium 4.2, Sodium 136, GFR >60  10/21/2023 Creatinine 1.56, BUN 17, Potassium 3.6, Sodium 135, GFR 46  10/20/2023 Creatinine 1.19, BUN 12, Potassium 4.3, Sodium 134, GFR >60  10/19/2023 Creatinine 1.29, BUN 10, Potassium 4.3, Sodium 137, GFR 58 (11:46 AM) 10/19/2023 Creatinine 1.23, BUN 10, Potassium 4.1, Sodium 136, GFR >60 (9:58 AM) A complete set of results can be found in Results Review.   Recommendations:  Unable to reach.     Follow-up plan: ICM clinic phone appointment on 04/18/2024.   91 day device clinic remote transmission 05/16/2024.     EP/Cardiology Office Visits:  No recall for next general cardiologist visit (last visit was 12/10/2023 with Dr Alvan).   Recall 09/03/2024 with Dr. Waddell.     Copy of ICM check sent to Dr. Waddell.     3 month ICM trend: 03/18/2024.    12-14 Month ICM trend:     Mitzie GORMAN Garner, RN 03/18/2024 8:32  AM

## 2024-03-18 NOTE — Telephone Encounter (Signed)
 Remote ICM transmission received.  Attempted call to patient regarding ICM remote transmission and no answer.

## 2024-03-23 ENCOUNTER — Other Ambulatory Visit: Payer: Self-pay

## 2024-03-23 MED ORDER — SPIRIVA RESPIMAT 2.5 MCG/ACT IN AERS: 2.0000 | INHALATION_SPRAY | Freq: Every day | RESPIRATORY_TRACT | 0 refills | Status: AC

## 2024-03-23 NOTE — Telephone Encounter (Signed)
 Will ok refill Spiriva  x one only.  Patient last OV 05/13/2023 with Dr. Brenna.  Patient needs OV.

## 2024-03-24 NOTE — Progress Notes (Signed)
 Remote ICD transmission.

## 2024-03-24 NOTE — Addendum Note (Signed)
 Addended by: TAWNI DRILLING D on: 03/24/2024 11:20 AM   Modules accepted: Orders

## 2024-04-01 ENCOUNTER — Other Ambulatory Visit: Payer: Self-pay | Admitting: Urology

## 2024-04-01 DIAGNOSIS — N401 Enlarged prostate with lower urinary tract symptoms: Secondary | ICD-10-CM

## 2024-04-05 ENCOUNTER — Other Ambulatory Visit: Payer: Self-pay | Admitting: Pulmonary Disease

## 2024-04-07 ENCOUNTER — Telehealth: Payer: Self-pay

## 2024-04-07 NOTE — Telephone Encounter (Signed)
 Copied from CRM [id number]. Topic: Clinical - Prescription Issue >> Apr 07, 2024  3:32 PM Isabell A wrote: Reason for CRM: Patient states he called [facility] and his refill for prescription azithromycin  (ZITHROMAX ) 500 MG tablet was not there - confirmed it was the correct [facility] on file.  Called and spoke with [facility] pharmacy. Pt was confused because his family member already picked med up and it was not at the pharmacy.  Pt has medication. Nfn

## 2024-04-18 ENCOUNTER — Encounter

## 2024-04-20 NOTE — Progress Notes (Signed)
 No ICM remote transmission received for 04/18/2024 and next ICM transmission scheduled for 05/17/2024.

## 2024-04-26 ENCOUNTER — Other Ambulatory Visit: Payer: Self-pay

## 2024-04-26 DIAGNOSIS — M109 Gout, unspecified: Secondary | ICD-10-CM | POA: Diagnosis not present

## 2024-04-26 DIAGNOSIS — M25551 Pain in right hip: Secondary | ICD-10-CM | POA: Diagnosis not present

## 2024-04-26 DIAGNOSIS — G894 Chronic pain syndrome: Secondary | ICD-10-CM | POA: Diagnosis not present

## 2024-04-26 DIAGNOSIS — K219 Gastro-esophageal reflux disease without esophagitis: Secondary | ICD-10-CM | POA: Diagnosis not present

## 2024-04-26 DIAGNOSIS — E782 Mixed hyperlipidemia: Secondary | ICD-10-CM | POA: Diagnosis not present

## 2024-04-26 DIAGNOSIS — I2510 Atherosclerotic heart disease of native coronary artery without angina pectoris: Secondary | ICD-10-CM | POA: Diagnosis not present

## 2024-04-26 DIAGNOSIS — R059 Cough, unspecified: Secondary | ICD-10-CM | POA: Diagnosis not present

## 2024-04-26 DIAGNOSIS — J189 Pneumonia, unspecified organism: Secondary | ICD-10-CM | POA: Diagnosis not present

## 2024-04-26 DIAGNOSIS — G629 Polyneuropathy, unspecified: Secondary | ICD-10-CM | POA: Diagnosis not present

## 2024-04-26 DIAGNOSIS — N1831 Chronic kidney disease, stage 3a: Secondary | ICD-10-CM | POA: Diagnosis not present

## 2024-04-26 DIAGNOSIS — N401 Enlarged prostate with lower urinary tract symptoms: Secondary | ICD-10-CM

## 2024-04-26 DIAGNOSIS — E1165 Type 2 diabetes mellitus with hyperglycemia: Secondary | ICD-10-CM | POA: Diagnosis not present

## 2024-04-26 MED ORDER — TAMSULOSIN HCL 0.4 MG PO CAPS
0.4000 mg | ORAL_CAPSULE | Freq: Every day | ORAL | 11 refills | Status: AC
Start: 2024-04-26 — End: ?

## 2024-04-26 NOTE — Telephone Encounter (Signed)
 Attempted to pt ans was unable to lvm to verify if pt was taking Rx of Tamsulosin  due to a refill request from [facility] appointment scheduled in the meantime and refill request sent to MD [email or web address] to refill if appropriate

## 2024-05-10 ENCOUNTER — Other Ambulatory Visit: Payer: Self-pay | Admitting: Pulmonary Disease

## 2024-05-11 NOTE — Telephone Encounter (Signed)
 Dr. Theophilus, please on refill. thanks

## 2024-05-17 ENCOUNTER — Telehealth: Payer: Self-pay

## 2024-05-17 ENCOUNTER — Encounter

## 2024-05-17 NOTE — Telephone Encounter (Signed)
 Attempted ICM call to patient.  Left message that home monitor is disconnected and not receiving remote transmissions.  Left tech service number if assistance is needed with home monitor.

## 2024-05-18 NOTE — Progress Notes (Signed)
 No ICM remote transmission received for 05/16/2024 and next ICM transmission scheduled for 06/13/2024.

## 2024-05-20 ENCOUNTER — Other Ambulatory Visit: Payer: Self-pay

## 2024-05-20 DIAGNOSIS — C3490 Malignant neoplasm of unspecified part of unspecified bronchus or lung: Secondary | ICD-10-CM

## 2024-05-21 DIAGNOSIS — E1065 Type 1 diabetes mellitus with hyperglycemia: Secondary | ICD-10-CM | POA: Diagnosis not present

## 2024-05-23 ENCOUNTER — Ambulatory Visit (HOSPITAL_COMMUNITY)

## 2024-05-23 ENCOUNTER — Inpatient Hospital Stay

## 2024-05-30 ENCOUNTER — Inpatient Hospital Stay: Admitting: Physician Assistant

## 2024-06-01 ENCOUNTER — Encounter: Payer: Self-pay | Admitting: Primary Care

## 2024-06-01 ENCOUNTER — Encounter: Admitting: Primary Care

## 2024-06-01 DIAGNOSIS — R911 Solitary pulmonary nodule: Secondary | ICD-10-CM

## 2024-06-01 DIAGNOSIS — J90 Pleural effusion, not elsewhere classified: Secondary | ICD-10-CM

## 2024-06-01 DIAGNOSIS — J9611 Chronic respiratory failure with hypoxia: Secondary | ICD-10-CM

## 2024-06-01 DIAGNOSIS — J449 Chronic obstructive pulmonary disease, unspecified: Secondary | ICD-10-CM
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
## 2024-06-05 ENCOUNTER — Other Ambulatory Visit: Payer: Self-pay | Admitting: Primary Care

## 2024-06-13 ENCOUNTER — Encounter

## 2024-06-15 ENCOUNTER — Telehealth: Payer: Self-pay

## 2024-06-15 NOTE — Telephone Encounter (Signed)
 Attempted call to patient.  Left message informing home monitor is showing disconnected.  Provided [employer] support number and ICM number for asistance.   Unable to reach patient for monthly ICM remote follow up and not receiving monthly remote transmission.  Patient disenrolled due patient is not actively participating in [facility] clinic.  Device clinic 91 day remote monitoring will continue per protocol.

## 2024-06-29 ENCOUNTER — Ambulatory Visit: Attending: *Deleted

## 2024-06-29 DIAGNOSIS — I5022 Chronic systolic (congestive) heart failure: Secondary | ICD-10-CM

## 2024-06-30 LAB — CUP PACEART REMOTE DEVICE CHECK
Battery Remaining Longevity: 40 mo
Battery Remaining Percentage: 44 %
Battery Voltage: 2.93 V
Brady Statistic AP VP Percent: 1 %
Brady Statistic AP VS Percent: 1 %
Brady Statistic AS VP Percent: 98 %
Brady Statistic AS VS Percent: 1 %
Brady Statistic RA Percent Paced: 1 %
Date Time Interrogation Session: 20251029171715
HighPow Impedance: 77 Ohm
Implantable Lead Connection Status: 753985
Implantable Lead Connection Status: 753985
Implantable Lead Connection Status: 753985
Implantable Lead Implant Date: 20210308
Implantable Lead Implant Date: 20210308
Implantable Lead Implant Date: 20210308
Implantable Lead Location: 753858
Implantable Lead Location: 753859
Implantable Lead Location: 753860
Implantable Lead Model: 7122
Implantable Pulse Generator Implant Date: 20210308
Lead Channel Impedance Value: 350 Ohm
Lead Channel Impedance Value: 450 Ohm
Lead Channel Impedance Value: 790 Ohm
Lead Channel Pacing Threshold Amplitude: 0.5 V
Lead Channel Pacing Threshold Amplitude: 0.75 V
Lead Channel Pacing Threshold Amplitude: 1 V
Lead Channel Pacing Threshold Pulse Width: 0.5 ms
Lead Channel Pacing Threshold Pulse Width: 0.5 ms
Lead Channel Pacing Threshold Pulse Width: 0.5 ms
Lead Channel Sensing Intrinsic Amplitude: 0.6 mV
Lead Channel Sensing Intrinsic Amplitude: 4.5 mV
Lead Channel Setting Pacing Amplitude: 1.5 V
Lead Channel Setting Pacing Amplitude: 2 V
Lead Channel Setting Pacing Amplitude: 2.5 V
Lead Channel Setting Pacing Pulse Width: 0.5 ms
Lead Channel Setting Pacing Pulse Width: 0.5 ms
Lead Channel Setting Sensing Sensitivity: 0.5 mV
Pulse Gen Serial Number: 111019088
Zone Setting Status: 755011

## 2024-07-06 ENCOUNTER — Other Ambulatory Visit: Payer: Self-pay | Admitting: Pulmonary Disease

## 2024-07-06 NOTE — Progress Notes (Signed)
 Remote ICD Transmission

## 2024-07-07 ENCOUNTER — Ambulatory Visit: Payer: Self-pay | Admitting: Internal Medicine

## 2024-08-04 DIAGNOSIS — Z993 Dependence on wheelchair: Secondary | ICD-10-CM | POA: Diagnosis not present

## 2024-08-04 DIAGNOSIS — Z0000 Encounter for general adult medical examination without abnormal findings: Secondary | ICD-10-CM | POA: Diagnosis not present

## 2024-08-04 DIAGNOSIS — C3490 Malignant neoplasm of unspecified part of unspecified bronchus or lung: Secondary | ICD-10-CM | POA: Diagnosis not present

## 2024-08-04 DIAGNOSIS — I110 Hypertensive heart disease with heart failure: Secondary | ICD-10-CM | POA: Diagnosis not present

## 2024-08-04 DIAGNOSIS — G894 Chronic pain syndrome: Secondary | ICD-10-CM | POA: Diagnosis not present

## 2024-08-04 DIAGNOSIS — J449 Chronic obstructive pulmonary disease, unspecified: Secondary | ICD-10-CM | POA: Diagnosis not present

## 2024-08-04 DIAGNOSIS — I82409 Acute embolism and thrombosis of unspecified deep veins of unspecified lower extremity: Secondary | ICD-10-CM | POA: Diagnosis not present

## 2024-08-04 DIAGNOSIS — I10 Essential (primary) hypertension: Secondary | ICD-10-CM | POA: Diagnosis not present

## 2024-08-04 DIAGNOSIS — I509 Heart failure, unspecified: Secondary | ICD-10-CM | POA: Diagnosis not present

## 2024-09-28 ENCOUNTER — Ambulatory Visit

## 2024-09-28 DIAGNOSIS — I5022 Chronic systolic (congestive) heart failure: Secondary | ICD-10-CM

## 2024-09-28 LAB — CUP PACEART REMOTE DEVICE CHECK
Battery Remaining Longevity: 36 mo
Battery Remaining Percentage: 41 %
Battery Voltage: 2.93 V
Brady Statistic AP VP Percent: 1 %
Brady Statistic AP VS Percent: 1 %
Brady Statistic AS VP Percent: 99 %
Brady Statistic AS VS Percent: 1 %
Brady Statistic RA Percent Paced: 1 %
Date Time Interrogation Session: 20260128020549
HighPow Impedance: 78 Ohm
Implantable Lead Connection Status: 753985
Implantable Lead Connection Status: 753985
Implantable Lead Connection Status: 753985
Implantable Lead Implant Date: 20210308
Implantable Lead Implant Date: 20210308
Implantable Lead Implant Date: 20210308
Implantable Lead Location: 753858
Implantable Lead Location: 753859
Implantable Lead Location: 753860
Implantable Lead Model: 7122
Implantable Pulse Generator Implant Date: 20210308
Lead Channel Impedance Value: 340 Ohm
Lead Channel Impedance Value: 440 Ohm
Lead Channel Impedance Value: 790 Ohm
Lead Channel Pacing Threshold Amplitude: 0.625 V
Lead Channel Pacing Threshold Amplitude: 0.75 V
Lead Channel Pacing Threshold Amplitude: 1 V
Lead Channel Pacing Threshold Pulse Width: 0.5 ms
Lead Channel Pacing Threshold Pulse Width: 0.5 ms
Lead Channel Pacing Threshold Pulse Width: 0.5 ms
Lead Channel Sensing Intrinsic Amplitude: 11.8 mV
Lead Channel Sensing Intrinsic Amplitude: 4.2 mV
Lead Channel Setting Pacing Amplitude: 1.625
Lead Channel Setting Pacing Amplitude: 2 V
Lead Channel Setting Pacing Amplitude: 2.5 V
Lead Channel Setting Pacing Pulse Width: 0.5 ms
Lead Channel Setting Pacing Pulse Width: 0.5 ms
Lead Channel Setting Sensing Sensitivity: 0.5 mV
Pulse Gen Serial Number: 111019088
Zone Setting Status: 755011

## 2024-10-01 ENCOUNTER — Ambulatory Visit: Payer: Self-pay | Admitting: Student in an Organized Health Care Education/Training Program

## 2024-10-06 NOTE — Progress Notes (Signed)
 Remote ICD Transmission

## 2024-11-01 ENCOUNTER — Ambulatory Visit: Admitting: Urology

## 2024-11-24 ENCOUNTER — Ambulatory Visit: Admitting: Urology

## 2024-12-28 ENCOUNTER — Ambulatory Visit

## 2025-03-29 ENCOUNTER — Ambulatory Visit
